# Patient Record
Sex: Female | Born: 1935
Health system: Southern US, Community
[De-identification: ages and names within clinical notes are randomized; demographics above are authoritative.]

## PROBLEM LIST (undated history)

## (undated) ENCOUNTER — Emergency Department (HOSPITAL_COMMUNITY): Admission: EM | Payer: Self-pay | Source: Home / Self Care

## (undated) DIAGNOSIS — H9190 Unspecified hearing loss, unspecified ear: Secondary | ICD-10-CM

## (undated) DIAGNOSIS — R918 Other nonspecific abnormal finding of lung field: Secondary | ICD-10-CM

## (undated) DIAGNOSIS — C801 Malignant (primary) neoplasm, unspecified: Secondary | ICD-10-CM

## (undated) DIAGNOSIS — I2510 Atherosclerotic heart disease of native coronary artery without angina pectoris: Secondary | ICD-10-CM

## (undated) DIAGNOSIS — I252 Old myocardial infarction: Secondary | ICD-10-CM

## (undated) DIAGNOSIS — I440 Atrioventricular block, first degree: Secondary | ICD-10-CM

## (undated) DIAGNOSIS — N823 Fistula of vagina to large intestine: Principal | ICD-10-CM

## (undated) DIAGNOSIS — R0600 Dyspnea, unspecified: Secondary | ICD-10-CM

## (undated) DIAGNOSIS — E785 Hyperlipidemia, unspecified: Secondary | ICD-10-CM

## (undated) DIAGNOSIS — IMO0001 Reserved for inherently not codable concepts without codable children: Secondary | ICD-10-CM

## (undated) DIAGNOSIS — Z955 Presence of coronary angioplasty implant and graft: Secondary | ICD-10-CM

## (undated) DIAGNOSIS — I480 Paroxysmal atrial fibrillation: Secondary | ICD-10-CM

## (undated) DIAGNOSIS — M1990 Unspecified osteoarthritis, unspecified site: Secondary | ICD-10-CM

## (undated) DIAGNOSIS — T4145XA Adverse effect of unspecified anesthetic, initial encounter: Secondary | ICD-10-CM

## (undated) DIAGNOSIS — T8859XA Other complications of anesthesia, initial encounter: Secondary | ICD-10-CM

## (undated) DIAGNOSIS — I10 Essential (primary) hypertension: Secondary | ICD-10-CM

## (undated) DIAGNOSIS — Z9889 Other specified postprocedural states: Secondary | ICD-10-CM

## (undated) DIAGNOSIS — H4450 Unspecified degenerated conditions of globe: Secondary | ICD-10-CM

## (undated) DIAGNOSIS — M8580 Other specified disorders of bone density and structure, unspecified site: Secondary | ICD-10-CM

## (undated) DIAGNOSIS — R112 Nausea with vomiting, unspecified: Secondary | ICD-10-CM

## (undated) DIAGNOSIS — Z933 Colostomy status: Secondary | ICD-10-CM

## (undated) DIAGNOSIS — R002 Palpitations: Secondary | ICD-10-CM

## (undated) HISTORY — DX: Unspecified osteoarthritis, unspecified site: M19.90

## (undated) HISTORY — PX: OTHER SURGICAL HISTORY: SHX169

## (undated) HISTORY — PX: CORONARY ANGIOPLASTY WITH STENT PLACEMENT: SHX49

## (undated) HISTORY — PX: TONSILLECTOMY: SUR1361

## (undated) HISTORY — DX: Essential (primary) hypertension: I10

## (undated) HISTORY — DX: Atherosclerotic heart disease of native coronary artery without angina pectoris: I25.10

## (undated) HISTORY — PX: CATARACT EXTRACTION W/ INTRAOCULAR LENS  IMPLANT, BILATERAL: SHX1307

## (undated) HISTORY — DX: Hyperlipidemia, unspecified: E78.5

## (undated) HISTORY — DX: Malignant (primary) neoplasm, unspecified: C80.1

## (undated) HISTORY — DX: Fistula of vagina to large intestine: N82.3

---
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
## 1968-10-14 HISTORY — PX: STAPEDECTOMY: SHX2435

## 1990-02-13 HISTORY — PX: TOTAL HIP ARTHROPLASTY: SHX124

## 1994-02-13 HISTORY — PX: KNEE SURGERY: SHX244

## 1998-07-15 ENCOUNTER — Inpatient Hospital Stay (HOSPITAL_COMMUNITY): Admission: RE | Admit: 1998-07-15 | Discharge: 1998-07-18 | Payer: Self-pay | Admitting: Orthopaedic Surgery

## 1998-07-15 ENCOUNTER — Encounter: Payer: Self-pay | Admitting: Orthopaedic Surgery

## 1998-10-15 ENCOUNTER — Encounter: Admission: RE | Admit: 1998-10-15 | Discharge: 1999-01-13 | Payer: Self-pay

## 1999-05-04 ENCOUNTER — Encounter: Payer: Self-pay | Admitting: Family Medicine

## 1999-05-04 ENCOUNTER — Encounter: Admission: RE | Admit: 1999-05-04 | Discharge: 1999-05-04 | Payer: Self-pay | Admitting: Family Medicine

## 2000-02-15 ENCOUNTER — Ambulatory Visit (HOSPITAL_COMMUNITY): Admission: RE | Admit: 2000-02-15 | Discharge: 2000-02-15 | Payer: Self-pay | Admitting: Gastroenterology

## 2000-05-14 ENCOUNTER — Encounter: Admission: RE | Admit: 2000-05-14 | Discharge: 2000-05-14 | Payer: Self-pay

## 2001-06-05 ENCOUNTER — Encounter: Admission: RE | Admit: 2001-06-05 | Discharge: 2001-06-05 | Payer: Self-pay | Admitting: Family Medicine

## 2001-06-05 ENCOUNTER — Encounter: Payer: Self-pay | Admitting: Family Medicine

## 2002-06-25 ENCOUNTER — Encounter: Payer: Self-pay | Admitting: Family Medicine

## 2002-06-25 ENCOUNTER — Encounter: Admission: RE | Admit: 2002-06-25 | Discharge: 2002-06-25 | Payer: Self-pay | Admitting: Family Medicine

## 2003-07-01 ENCOUNTER — Encounter: Admission: RE | Admit: 2003-07-01 | Discharge: 2003-07-01 | Payer: Self-pay | Admitting: Family Medicine

## 2004-02-10 ENCOUNTER — Ambulatory Visit (HOSPITAL_COMMUNITY): Admission: RE | Admit: 2004-02-10 | Discharge: 2004-02-10 | Payer: Self-pay | Admitting: Family Medicine

## 2004-02-26 ENCOUNTER — Ambulatory Visit: Admission: RE | Admit: 2004-02-26 | Discharge: 2004-02-26 | Payer: Self-pay | Admitting: Orthopedic Surgery

## 2004-03-01 ENCOUNTER — Ambulatory Visit (HOSPITAL_COMMUNITY): Admission: RE | Admit: 2004-03-01 | Discharge: 2004-03-01 | Payer: Self-pay | Admitting: Family Medicine

## 2004-03-09 ENCOUNTER — Ambulatory Visit (HOSPITAL_COMMUNITY): Admission: RE | Admit: 2004-03-09 | Discharge: 2004-03-09 | Payer: Self-pay

## 2004-07-06 ENCOUNTER — Encounter: Admission: RE | Admit: 2004-07-06 | Discharge: 2004-07-06 | Payer: Self-pay | Admitting: Family Medicine

## 2005-07-12 ENCOUNTER — Encounter: Admission: RE | Admit: 2005-07-12 | Discharge: 2005-07-12 | Payer: Self-pay | Admitting: Family Medicine

## 2006-07-17 ENCOUNTER — Encounter: Admission: RE | Admit: 2006-07-17 | Discharge: 2006-07-17 | Payer: Self-pay | Admitting: Family Medicine

## 2007-08-06 ENCOUNTER — Encounter: Admission: RE | Admit: 2007-08-06 | Discharge: 2007-08-06 | Payer: Self-pay | Admitting: Family Medicine

## 2008-08-06 ENCOUNTER — Encounter: Admission: RE | Admit: 2008-08-06 | Discharge: 2008-08-06 | Payer: Self-pay | Admitting: Family Medicine

## 2009-08-15 ENCOUNTER — Inpatient Hospital Stay (HOSPITAL_COMMUNITY): Admission: EM | Admit: 2009-08-15 | Discharge: 2009-08-17 | Payer: Self-pay | Admitting: Emergency Medicine

## 2009-10-25 ENCOUNTER — Encounter: Admission: RE | Admit: 2009-10-25 | Discharge: 2009-10-25 | Payer: Self-pay | Admitting: *Deleted

## 2009-11-10 ENCOUNTER — Ambulatory Visit (HOSPITAL_BASED_OUTPATIENT_CLINIC_OR_DEPARTMENT_OTHER): Admission: RE | Admit: 2009-11-10 | Discharge: 2009-11-10 | Payer: Self-pay | Admitting: Orthopedic Surgery

## 2009-11-10 HISTORY — PX: OTHER SURGICAL HISTORY: SHX169

## 2009-12-23 ENCOUNTER — Ambulatory Visit (HOSPITAL_COMMUNITY): Admission: RE | Admit: 2009-12-23 | Discharge: 2009-12-24 | Payer: Self-pay | Admitting: Otolaryngology

## 2009-12-23 ENCOUNTER — Encounter (INDEPENDENT_AMBULATORY_CARE_PROVIDER_SITE_OTHER): Payer: Self-pay | Admitting: Otolaryngology

## 2009-12-23 HISTORY — PX: TYMPANOPLASTY: SHX33

## 2010-03-05 ENCOUNTER — Encounter: Payer: Self-pay | Admitting: Family Medicine

## 2010-03-06 ENCOUNTER — Encounter: Payer: Self-pay | Admitting: Family Medicine

## 2010-04-04 NOTE — Op Note (Signed)
NAMEELASHA, TESS               ACCOUNT NO.:  [id number]  MEDICAL RECORD NO.:  [id number]          PATIENT TYPE:  AMB  LOCATION:  DSC                          FACILITY:  [facility]  PHYSICIAN:  Cindee Salt, M.D.       DATE OF BIRTH:  July 29, 1916  DATE OF PROCEDURE:  11/10/2009 DATE OF DISCHARGE:                              OPERATIVE REPORT   PREOPERATIVE DIAGNOSIS:  Malunion left distal radius, carpal tunnel syndrome left hand.  POSTOPERATIVE DIAGNOSIS:  Malunion left distal radius, carpal tunnel syndrome left hand.  OPERATION:  Osteotomy left distal radius with release of left carpal tunnel.  SURGEON:  Cindee Salt, MD  ASSISTANT:  Artist Pais. Mina Marble, MD  ANESTHESIA:  Axillary general.  ANESTHESIOLOGIST:  Germaine Pomfret, MD  HISTORY:  The patient is a 77 year old female who suffered a fall, fracturing left distal radius.  She has had this heal in a malunited position.  She also has developed carpal tunnel syndrome with numbnessand tingling of all of her fingers.  EMG nerve conductions are positive. She has elected to undergo osteotomy with decompression of the median nerve.  Pre, peri, postoperative course have been discussed along with risks and complications.  She is aware that there is no guarantee with the surgery, possibility of infection, recurrence of injury to arteries, nerves, tendons, incomplete relief of symptoms, dystrophy.  In the preoperative area, the patient is seen, the extremity marked by both the patient and surgeon, antibiotic given.  PROCEDURE:  The patient was brought to the operating room where a general anesthetic was carried out without difficulty after an axillary block was carried out.  She was prepped using DuraPrep, supine position, left arm free.  A 3-minute dry time was allowed, time-out taken, confirming the patient and procedure.  Following an adequate anesthesia, the limb was exsanguinated with an Esmarch bandage, tourniquet  placed high on the arm was inflated to 250 mmHg.  An incision was made, extended for a volar distal plate, carried down through subcutaneous tissue.  Bleeders were electrocauterized with bipolar.  Dissection carried through the flexor carpi radialis tendon sheath down to the pronator quadratus.  This was incised.  The periosteum elevated.  The brachioradialis was released.  The fracture line was identified with image intensification.  An osteotomy was then performed with an oscillating saw.  The dorsal scar was then released.  This allowed the distal fragment to be manipulated volarly.  A narrow standard DVR plate to the left wrist was then applied.  The distal screws were placed. These were all locking screws measuring between 20 and 24 mm.  These firmly fixed the fracture fragment distally.  The plate was then approximated to the radial shaft.  This was pinned in position to confirm position.  The distal radius was adequately reduced with restoration of reasonable volar tilt, length, and inclination.  This produced a very significant dorsal gap.  The plate was then affixed with 13, 12, and 10 mm screws.  A Trinity bone graft was then defrosted, cleared.  This was then packed into the osteotomy site dorsally.  X-rays confirmed adequate positioning of the plate,  screws, and bone graft. The wound was irrigated removing any chips from the soft tissue as much as possible without disturbing the dorsal graft.  The pronator quadratus was repaired as much as possible with interrupted 4-0 Vicryl sutures, the subcutaneous tissue with interrupted 4-0 Vicryl, and the skin with interrupted 4-0 Vicryl Rapide.  Separate incision was then made for carpal tunnel release, carried down through subcutaneous tissue. Bleeders again electrocauterized with bipolar.  The palmar fascia split. Superficial palmar arch identified.  The flexor retinaculum was then incised over its entire course.  Retractors were  placed proximally and a release performed of the volar fascia.  The nerve was explored.  I area of compression was apparent.  No further lesions were identified.  The wound was irrigated and closed with interrupted 5-0 Vicryl Rapide sutures.  A sterile compressive and dorsal palmar splint was applied. On deflation of the tourniquet, all fingers immediately pinked.  She was taken to the recovery room for observation in satisfactory condition. She will be discharged home being admitted for overnight stay on Percocet.          ______________________________ Cindee Salt, M.D.     GK/MEDQ  D:  11/10/2009  T:  11/11/2009  Job:  [id number]  cc:   Estill Bamberg, MD  Electronically Signed by Cindee Salt M.D. on 04/04/2010 12:10:08 PM

## 2010-04-26 LAB — URINALYSIS, ROUTINE W REFLEX MICROSCOPIC
Bilirubin Urine: NEGATIVE
Glucose, UA: NEGATIVE mg/dL
Hgb urine dipstick: NEGATIVE
Ketones, ur: NEGATIVE mg/dL
Nitrite: NEGATIVE
Protein, ur: NEGATIVE mg/dL
Specific Gravity, Urine: 1.018 (ref 1.005–1.030)
Urobilinogen, UA: 0.2 mg/dL (ref 0.0–1.0)
pH: 6 (ref 5.0–8.0)

## 2010-04-26 LAB — COMPREHENSIVE METABOLIC PANEL WITH GFR
ALT: 21 U/L (ref 0–35)
AST: 24 U/L (ref 0–37)
CO2: 32 meq/L (ref 19–32)
Calcium: 9.9 mg/dL (ref 8.4–10.5)
Chloride: 102 meq/L (ref 96–112)
GFR calc Af Amer: >60 mL/min (ref >60)
GFR calc non Af Amer: 58 mL/min — ABNORMAL LOW (ref >60)
Sodium: 138 meq/L (ref 135–145)
Total Bilirubin: 0.6 mg/dL (ref 0.3–1.2)

## 2010-04-26 LAB — SURGICAL PCR SCREEN
MRSA, PCR: NEGATIVE
Staphylococcus aureus: NEGATIVE

## 2010-04-26 LAB — CBC
HCT: 37.7 % (ref 36.0–46.0)
Hemoglobin: 12.5 g/dL (ref 12.0–15.0)
MCH: 31.3 pg (ref 26.0–34.0)
MCHC: 33.2 g/dL (ref 30.0–36.0)
MCV: 94.3 fL (ref 78.0–100.0)
Platelets: 210 10*3/uL (ref 150–400)
RBC: 4 MIL/uL (ref 3.87–5.11)
RDW: 12.3 % (ref 11.5–15.5)
WBC: 7.3 10*3/uL (ref 4.0–10.5)

## 2010-04-26 LAB — COMPREHENSIVE METABOLIC PANEL
Albumin: 3.7 g/dL (ref 3.5–5.2)
Alkaline Phosphatase: 67 U/L (ref 39–117)
BUN: 13 mg/dL (ref 6–23)
Creatinine, Ser: 0.91 mg/dL (ref 0.4–1.2)
Glucose, Bld: 81 mg/dL (ref 70–99)
Potassium: 4.8 mEq/L (ref 3.5–5.1)
Total Protein: 6.9 g/dL (ref 6.0–8.3)

## 2010-04-28 LAB — POCT I-STAT, CHEM 8
Calcium, Ion: 0.95 mmol/L — ABNORMAL LOW (ref 1.12–1.32)
Chloride: 109 mEq/L (ref 96–112)
HCT: 40 % (ref 36.0–46.0)
Sodium: 137 mEq/L (ref 135–145)
TCO2: 22 mmol/L (ref 0–100)

## 2010-05-01 LAB — URINALYSIS, ROUTINE W REFLEX MICROSCOPIC
Bilirubin Urine: NEGATIVE
Hgb urine dipstick: NEGATIVE
Protein, ur: NEGATIVE mg/dL
Urobilinogen, UA: 0.2 mg/dL (ref 0.0–1.0)

## 2010-05-01 LAB — DIFFERENTIAL
Basophils Absolute: 0 10*3/uL (ref 0.0–0.1)
Lymphocytes Relative: 8 % — ABNORMAL LOW (ref 12–46)
Neutro Abs: 6.8 10*3/uL (ref 1.7–7.7)

## 2010-05-01 LAB — COMPREHENSIVE METABOLIC PANEL
ALT: 25 U/L (ref 0–35)
AST: 38 U/L — ABNORMAL HIGH (ref 0–37)
Albumin: 3.3 g/dL — ABNORMAL LOW (ref 3.5–5.2)
Alkaline Phosphatase: 48 U/L (ref 39–117)
Alkaline Phosphatase: 55 U/L (ref 39–117)
BUN: 13 mg/dL (ref 6–23)
CO2: 24 mEq/L (ref 19–32)
Chloride: 107 mEq/L (ref 96–112)
GFR calc non Af Amer: >60 mL/min (ref >60)
Glucose, Bld: 88 mg/dL (ref 70–99)
Potassium: 3.8 mEq/L (ref 3.5–5.1)
Potassium: 4.1 mEq/L (ref 3.5–5.1)
Sodium: 136 mEq/L (ref 135–145)
Total Bilirubin: 0.8 mg/dL (ref 0.3–1.2)
Total Protein: 5.5 g/dL — ABNORMAL LOW (ref 6.0–8.3)
Total Protein: 6.1 g/dL (ref 6.0–8.3)

## 2010-05-01 LAB — APTT: aPTT: 27 seconds (ref 24–37)

## 2010-05-01 LAB — URINE CULTURE

## 2010-05-01 LAB — TROPONIN I
Troponin I: 0.02 ng/mL (ref 0.00–0.06)
Troponin I: 0.02 ng/mL (ref 0.00–0.06)
Troponin I: 0.04 ng/mL (ref 0.00–0.06)

## 2010-05-01 LAB — CBC
HCT: 30.5 % — ABNORMAL LOW (ref 36.0–46.0)
MCH: 31.8 pg (ref 26.0–34.0)
MCHC: 34.3 g/dL (ref 30.0–36.0)
Platelets: 194 10*3/uL (ref 150–400)
Platelets: 28 10*3/uL — CL (ref 150–400)
RBC: 3.12 MIL/uL — ABNORMAL LOW (ref 3.87–5.11)
RDW: 12.1 % (ref 11.5–15.5)
RDW: 12.4 % (ref 11.5–15.5)
RDW: 12.4 % (ref 11.5–15.5)
WBC: 2.8 10*3/uL — ABNORMAL LOW (ref 4.0–10.5)
WBC: 7.5 10*3/uL (ref 4.0–10.5)

## 2010-05-01 LAB — CK TOTAL AND CKMB (NOT AT ARMC)
CK, MB: 3.4 ng/mL (ref 0.3–4.0)
Relative Index: 1.6 (ref 0.0–2.5)
Total CK: 212 U/L — ABNORMAL HIGH (ref 7–177)
Total CK: 225 U/L — ABNORMAL HIGH (ref 7–177)
Total CK: 237 U/L — ABNORMAL HIGH (ref 7–177)

## 2010-05-01 LAB — PROTIME-INR
INR: 0.99 (ref 0.00–1.49)
Prothrombin Time: 13 seconds (ref 11.6–15.2)

## 2010-05-01 LAB — POCT CARDIAC MARKERS
CKMB, poc: 1.8 ng/mL (ref 1.0–8.0)
Troponin i, poc: <0.05 ng/mL (ref 0.00–0.09)

## 2010-07-01 NOTE — Procedures (Signed)
[facility]  Patient:    [name], [name]                      MRN: [id number] Proc. Date: 02/15/00 Adm. Date:  [id number] Attending:  Louie Bun CC:         Hadassah Pais. Jeannetta Nap, M.D.   Procedure Report  PROCEDURES:  Colonoscopy.  INDICATIONS FOR PROCEDURE:  Hemoccult positive stools in a patient with family history of colon cancer in a first degree relative whose last colonoscopy was approximately four years ago.  DESCRIPTION OF PROCEDURE:  The patient was placed in the left lateral decubitus position and placed on the pulse monitor with continuous low-flow oxygen delivered by nasal cannula.  She was sedated with 50 mg of IV Demerol and 6 mg of IV Versed.  The Olympus video colonoscope was inserted into the rectum and advanced to the cecum, confirmed by transillumination McBurneys point and visualization of the ileocecal valve and appendicial orifice.  The prep was good.  The cecum, ascending, transverse, and proximal descending colon appeared normal with no masses, polyps, diverticula, or other mucosal abnormalities.  Within the distal descending and sigmoid colon there was seen several scattered diverticula, but no other abnormalities.  The rectum appeared normal and retroflexed view of the anus showed no obvious prominence of internal hemorrhoids.  The colonoscope was then withdrawn and the patient returned to the recovery room in stable condition.  She tolerated the procedure well and there were no immediate complications.  IMPRESSION:  Diverticula, otherwise normal colonoscopy.  PLAN:  At age 77 we will probably discontinue surveillance colonoscopy, but will consider in five years if reasonable. DD:  02/15/00 TD:  02/15/00 Job: [id number]

## 2010-08-14 DIAGNOSIS — C801 Malignant (primary) neoplasm, unspecified: Secondary | ICD-10-CM

## 2010-08-14 DIAGNOSIS — Z955 Presence of coronary angioplasty implant and graft: Secondary | ICD-10-CM

## 2010-08-14 HISTORY — DX: Malignant (primary) neoplasm, unspecified: C80.1

## 2010-08-14 HISTORY — DX: Presence of coronary angioplasty implant and graft: Z95.5

## 2010-08-18 ENCOUNTER — Other Ambulatory Visit: Payer: Self-pay | Admitting: Gastroenterology

## 2010-08-26 ENCOUNTER — Ambulatory Visit (HOSPITAL_COMMUNITY)
Admission: EM | Admit: 2010-08-26 | Discharge: 2010-08-26 | Disposition: A | Discharge status: Home / Self Care | Payer: Medicare Other | Source: Ambulatory Visit | Attending: Cardiology | Admitting: Cardiology

## 2010-08-26 ENCOUNTER — Emergency Department (HOSPITAL_COMMUNITY): Admission: EM | Admit: 2010-08-26 | Payer: Self-pay | Source: Home / Self Care

## 2010-08-26 ENCOUNTER — Inpatient Hospital Stay (HOSPITAL_COMMUNITY)
Admission: AD | Admit: 2010-08-26 | Discharge: 2010-08-30 | DRG: 249 | Disposition: A | Discharge status: Home / Self Care | Payer: Medicare Other | Source: Ambulatory Visit | Attending: Cardiology | Admitting: Cardiology

## 2010-08-26 DIAGNOSIS — Z882 Allergy status to sulfonamides status: Secondary | ICD-10-CM

## 2010-08-26 DIAGNOSIS — I4891 Unspecified atrial fibrillation: Secondary | ICD-10-CM | POA: Diagnosis present

## 2010-08-26 DIAGNOSIS — I2119 ST elevation (STEMI) myocardial infarction involving other coronary artery of inferior wall: Secondary | ICD-10-CM

## 2010-08-26 DIAGNOSIS — Z96649 Presence of unspecified artificial hip joint: Secondary | ICD-10-CM

## 2010-08-26 DIAGNOSIS — C20 Malignant neoplasm of rectum: Secondary | ICD-10-CM | POA: Diagnosis present

## 2010-08-26 DIAGNOSIS — Z7982 Long term (current) use of aspirin: Secondary | ICD-10-CM

## 2010-08-26 DIAGNOSIS — Z7902 Long term (current) use of antithrombotics/antiplatelets: Secondary | ICD-10-CM

## 2010-08-26 DIAGNOSIS — I2510 Atherosclerotic heart disease of native coronary artery without angina pectoris: Secondary | ICD-10-CM

## 2010-08-26 DIAGNOSIS — Z886 Allergy status to analgesic agent status: Secondary | ICD-10-CM

## 2010-08-26 LAB — CBC
HCT: 32.8 % — ABNORMAL LOW (ref 36.0–46.0)
Hemoglobin: 11.3 g/dL — ABNORMAL LOW (ref 12.0–15.0)
Hemoglobin: 11.7 g/dL — ABNORMAL LOW (ref 12.0–15.0)
MCH: 31.3 pg (ref 26.0–34.0)
MCH: 32.1 pg (ref 26.0–34.0)
MCHC: 34.5 g/dL (ref 30.0–36.0)
MCHC: 34.9 g/dL (ref 30.0–36.0)
MCV: 90.9 fL (ref 78.0–100.0)
Platelets: 204 10*3/uL (ref 150–400)
RBC: 3.65 MIL/uL — ABNORMAL LOW (ref 3.87–5.11)
RDW: 12.3 % (ref 11.5–15.5)

## 2010-08-26 LAB — DIFFERENTIAL
Basophils Absolute: 0 10*3/uL (ref 0.0–0.1)
Basophils Relative: 0 % (ref 0–1)
Eosinophils Absolute: 0.1 10*3/uL (ref 0.0–0.7)
Monocytes Absolute: 0.3 10*3/uL (ref 0.1–1.0)
Monocytes Relative: 6 % (ref 3–12)
Neutro Abs: 3.7 10*3/uL (ref 1.7–7.7)
Neutrophils Relative %: 68 % (ref 43–77)

## 2010-08-26 LAB — CARDIAC PANEL(CRET KIN+CKTOT+MB+TROPI)
CK, MB: 10.4 ng/mL (ref 0.3–4.0)
Relative Index: 7.2 — ABNORMAL HIGH (ref 0.0–2.5)
Troponin I: 0.68 ng/mL (ref <0.30)

## 2010-08-26 LAB — COMPREHENSIVE METABOLIC PANEL
AST: 46 U/L — ABNORMAL HIGH (ref 0–37)
Albumin: 3.1 g/dL — ABNORMAL LOW (ref 3.5–5.2)
Alkaline Phosphatase: 59 U/L (ref 39–117)
Chloride: 101 mEq/L (ref 96–112)
Potassium: 3.9 mEq/L (ref 3.5–5.1)
Sodium: 133 mEq/L — ABNORMAL LOW (ref 135–145)
Total Bilirubin: 0.3 mg/dL (ref 0.3–1.2)
Total Protein: 6.2 g/dL (ref 6.0–8.3)

## 2010-08-26 LAB — MAGNESIUM: Magnesium: 1.8 mg/dL (ref 1.5–2.5)

## 2010-08-26 LAB — LIPID PANEL
LDL Cholesterol: 90 mg/dL (ref 0–99)
Total CHOL/HDL Ratio: 2.9 RATIO
VLDL: 28 mg/dL (ref 0–40)

## 2010-08-26 LAB — MRSA PCR SCREENING: MRSA by PCR: NEGATIVE

## 2010-08-27 DIAGNOSIS — R072 Precordial pain: Secondary | ICD-10-CM

## 2010-08-27 LAB — CARDIAC PANEL(CRET KIN+CKTOT+MB+TROPI)
CK, MB: 41.8 ng/mL (ref 0.3–4.0)
CK, MB: 40.4 ng/mL (ref 0.3–4.0)
Total CK: 369 U/L — ABNORMAL HIGH (ref 7–177)
Troponin I: 19.41 ng/mL (ref <0.30)

## 2010-08-27 LAB — CBC
Hemoglobin: 10.6 g/dL — ABNORMAL LOW (ref 12.0–15.0)
MCH: 31.2 pg (ref 26.0–34.0)
MCHC: 33.8 g/dL (ref 30.0–36.0)
MCV: 92.4 fL (ref 78.0–100.0)

## 2010-08-27 LAB — BASIC METABOLIC PANEL
CO2: 27 mEq/L (ref 19–32)
Calcium: 8.9 mg/dL (ref 8.4–10.5)
Creatinine, Ser: 0.72 mg/dL (ref 0.50–1.10)
GFR calc non Af Amer: >60 mL/min (ref >60)
Glucose, Bld: 92 mg/dL (ref 70–99)
Sodium: 134 mEq/L — ABNORMAL LOW (ref 135–145)

## 2010-08-27 LAB — HEMOGLOBIN A1C: Hgb A1c MFr Bld: 5.9 % — ABNORMAL HIGH (ref <5.7)

## 2010-08-27 LAB — TSH: TSH: 1.795 u[IU]/mL (ref 0.350–4.500)

## 2010-08-29 LAB — CARDIAC PANEL(CRET KIN+CKTOT+MB+TROPI)
CK, MB: 5.1 ng/mL — ABNORMAL HIGH (ref 0.3–4.0)
Relative Index: INVALID (ref 0.0–2.5)
Total CK: 92 U/L (ref 7–177)
Troponin I: 3 ng/mL (ref <0.30)

## 2010-08-29 LAB — BASIC METABOLIC PANEL
BUN: 13 mg/dL (ref 6–23)
Creatinine, Ser: 0.71 mg/dL (ref 0.50–1.10)
GFR calc Af Amer: >60 mL/min (ref >60)
GFR calc non Af Amer: >60 mL/min (ref >60)
Glucose, Bld: 87 mg/dL (ref 70–99)
Potassium: 3.9 mEq/L (ref 3.5–5.1)

## 2010-08-29 LAB — CBC
HCT: 37.1 % (ref 36.0–46.0)
Hemoglobin: 12.7 g/dL (ref 12.0–15.0)
MCH: 31.5 pg (ref 26.0–34.0)
MCV: 92.1 fL (ref 78.0–100.0)
Platelets: 201 10*3/uL (ref 150–400)
RBC: 4.03 MIL/uL (ref 3.87–5.11)
WBC: 7.3 10*3/uL (ref 4.0–10.5)

## 2010-08-29 LAB — PLATELET INHIBITION P2Y12
Platelet Function  P2Y12: 270 [PRU] (ref 194–418)
Platelet Function Baseline: 317 [PRU] (ref 194–418)

## 2010-08-29 NOTE — Cardiovascular Report (Signed)
[name], [name] NO.:  [id number]  MEDICAL RECORD NO.:  [id number]  LOCATION:                                 FACILITY:  PHYSICIAN:  Verne Carrow, MDDATE OF BIRTH:  06-Sep-1916  DATE OF PROCEDURE:  08/26/2010 DATE OF DISCHARGE:                           CARDIAC CATHETERIZATION   PRIMARY CARE PHYSICIAN:  Windle Guard, MD  PROCEDURES PERFORMED: 1. Left heart catheterization. 2. Selective coronary angiography. 3. PTCA with placement of a bare metal stent in the mid right coronary     artery.  OPERATOR:  Verne Carrow, MD  INDICATION:  This is a 77 year old Caucasian female who presents with chest pain and has EKG changes consistent with an inferior ST-elevation myocardial infarction.  DETAILS OF PROCEDURE:  The patient was brought emergently to the cardiac catheterization laboratory.  Emergency consent was obtained.  The right groin was prepped and draped in sterile fashion.  Lidocaine 1% was used for local anesthesia.  A 6-French sheath was inserted into the right femoral artery without difficulty.  A JL-4 diagnostic catheter was used to perform angiography of the left coronary system.  We then perform selective angiography of the right coronary artery with a 6-French JR-4 guiding catheter.  The patient was found to have a totally occluded mid right coronary artery.  She was given a bolus of Angiomax and a drip was started.  We then crossed the total occlusion with a cougar intracoronary wire.  A 2.5 x 12-mm balloon was used to reestablish flow. There was excellent flow into the distal vessel following 2 balloon inflations.  A 2.75 x 15-mm Vision bare metal stent was then carefully deployed in the mid vessel.  A 3.0 x 12-mm noncompliant balloon was carefully positioned inside the stent and was inflated.  There was an excellent angiographic result.  The stenosis was taken from 100% to 0%. The patient tolerated the procedure well.  She  did have an episode of bradycardia following reperfusion and was given atropine intravenously. The patient was loaded with 600 mg of Plavix.  She was taken to the CCU on an Angiomax drip at a reduced rate.  HEMODYNAMIC FINDINGS:  Central aortic pressure 138/60.  Left ventricular pressure 138/7/11.  ANGIOGRAPHIC FINDINGS: 1. The left main coronary artery had no obstructive disease. 2. The left anterior descending was a large vessel that coursed to the     apex and had 30% proximal stenosis and long tubular 50% mid     stenosis.  First diagonal was a moderate-sized vessel with 40%     ostial stenosis. 3. The circumflex artery had a proximal 95% stenosis.  There was a     large obtuse marginal branch that was free of any disease. 4. The right coronary artery was a large dominant vessel with 100% mid     occlusion. 5. No left ventricular angiogram was performed.  The total balloon time was 11 minutes.  IMPRESSION: 1. Acute inferior ST-elevation myocardial infarction. 2. Percutaneous transluminal coronary angioplasty with placement of a bare metal stent in the right coronary artery. 3. Residual stenosis in the proximal circumflex artery.  RECOMMENDATIONS:  The patient will be continued on aspirin, Plavix, a  beta-blocker, and a statin.  She will be watched closely in the CCU over the next 48 hours.  We will plan on performing percutaneous intervention of the circumflex artery on Monday, August 29, 2010.     Verne Carrow, MD     CM/MEDQ  D:  08/26/2010  T:  08/27/2010  Job:  [id number]  cc:   Windle Guard, M.D.  Electronically Signed by Verne Carrow MD on 08/29/2010 12:58:58 PM

## 2010-08-29 NOTE — H&P (Signed)
  NAMELORRAINA, SPRING NO.:  [id number]  MEDICAL RECORD NO.:  [id number]  LOCATION:  2905                         FACILITY:  [facility]  PHYSICIAN:  Verne Carrow, MDDATE OF BIRTH:  June 07, 1916  DATE OF ADMISSION:  08/26/2010 DATE OF DISCHARGE:                             HISTORY & PHYSICAL   REASON FOR ADMISSION:  ST elevation myocardial infarction.  HISTORY OF PRESENT ILLNESS:  Ms. Sager is a pleasant 77 year old Caucasian female with a history of atrial fibrillation and recently diagnosed rectal cancer who began to have chest discomfort at 2:30 this afternoon.  She was sent to see her primary care physician who got an EKG and then called [facility].  Her EKG was consistent with an inferior ST elevation myocardial infarction.  A code STEMI was activated.  The patient was brought straight to the cath lab.  She was having ongoing chest pain upon arrival in the cath lab.  I performed a quick assessment.  I discovered that she was a very functional 77 year old.  She agreed to proceed with catheterization.  PAST MEDICAL HISTORY: 1. Atrial fibrillation. 2. Recently diagnosed rectal cancer.  PAST SURGICAL HISTORY: 1. Hysterectomy. 2. Appendectomy. 3. Hip replacement. 4. Right knee surgery. 5. Tonsillectomy. 6. Bilateral cataract surgery. 7. Carpal tunnel release.  ALLERGIES:  SULFA, EFFEXOR, HYDROCODONE and VICODIN.  HOME MEDICATIONS:  Atenolol and aspirin, doses unknown.  SOCIAL HISTORY:  The patient denies use of alcohol, drugs or tobacco.  FAMILY HISTORY:  No coronary artery disease.  REVIEW OF SYSTEMS:  As stated in history of present illness is otherwise negative.  PHYSICAL EXAMINATION:  VITAL SIGNS:  Blood pressure 130/60, pulse 65 and regular, respirations 12 and unlabored, temperature afebrile. GENERAL:  She is a pleasant elderly Caucasian female in no acute distress. NECK:  No JVD.  No carotid bruits. SKIN:  Warm and  dry. PSYCHIATRIC:  Mood and affect are appropriate. MUSCULOSKELETAL:  Moves all extremities equally. NEUROLOGICAL:  Nonfocal. LUNGS:  Clear to auscultation bilaterally. CARDIOVASCULAR:  Regular rate and rhythm with mild systolic murmur. ABDOMEN:  Soft.  Bowel sounds are present. EXTREMITIES:  No evidence of edema.  DIAGNOSTIC STUDIES:  Twelve-lead EKG has ST-segment elevation consistent with an inferior myocardial infarction.  No other laboratory values are available at this time.  ASSESSMENT/PLAN:  This is a 77 year old functional Caucasian female who presents with complaints of chest pain is found to have EKG changes consistent with an acute inferior ST elevation myocardial infarction. Plans will be for an emergent left heart catheterization.     Verne Carrow, MD     CM/MEDQ  D:  08/26/2010  T:  08/27/2010  Job:  [id number]  Electronically Signed by Verne Carrow MD on 08/29/2010 12:58:56 PM

## 2010-08-30 ENCOUNTER — Ambulatory Visit (INDEPENDENT_AMBULATORY_CARE_PROVIDER_SITE_OTHER): Payer: Self-pay | Admitting: Surgery

## 2010-08-30 LAB — CBC
Hemoglobin: 12 g/dL (ref 12.0–15.0)
MCH: 31 pg (ref 26.0–34.0)
MCHC: 33.6 g/dL (ref 30.0–36.0)
RDW: 12.5 % (ref 11.5–15.5)

## 2010-08-30 LAB — BASIC METABOLIC PANEL
BUN: 12 mg/dL (ref 6–23)
Calcium: 8.8 mg/dL (ref 8.4–10.5)
GFR calc Af Amer: >60 mL/min (ref >60)
GFR calc non Af Amer: >60 mL/min (ref >60)
Glucose, Bld: 89 mg/dL (ref 70–99)
Potassium: 4.3 mEq/L (ref 3.5–5.1)
Sodium: 138 mEq/L (ref 135–145)

## 2010-08-30 LAB — POCT ACTIVATED CLOTTING TIME: Activated Clotting Time: 336 seconds

## 2010-08-30 NOTE — Discharge Summary (Addendum)
NAMEYUMA, [name] NO.:  [id number]  MEDICAL RECORD NO.:  [id number]  LOCATION:  6529                         FACILITY:  [facility]  PHYSICIAN:  Verne Carrow, MDDATE OF BIRTH:  16-Aug-1916  DATE OF ADMISSION:  08/26/2010 DATE OF DISCHARGE:  08/30/2010                              DISCHARGE SUMMARY   DISCHARGE DIAGNOSES: 1. Newly diagnosed coronary artery disease presenting initially as an     ST-elevation myocardial infarction.     a.     Emergent catheterization on August 26, 2010, for ST-segment      elevation myocardial infarction with percutaneous transluminal      coronary angioplasty and placement of bare-metal stent to the mid      right coronary artery.     b.     Staged percutaneous coronary intervention on August 29, 2010,      with bare-metal stent placement to the mid circumflex artery. 2. Paroxysmal atrial fibrillation, not currently felt to be a     Coumadin. 3. New recently diagnosed rectal cancer.  PAST SURGICAL HISTORY:  Hysterectomy, appendectomy, hip replacement, right knee surgery, tonsillectomy, cataract surgery, and carpal tunnel weight.  HOSPITAL COURSE:  [name] is a 77 year old functional elderly female with a history of a AFib with recently diagnosed rectal cancer who presents to her of PCP's office with complaints of chest discomfort. EKG was consistent with an ST-elevation MI inferiorly.  She was subsequently transferred to the [facility] for emergent heart catheterization.  She was found to have a significant 100% mid occlusion of the RCA, which subsequently was intervened upon with PTCA and placement of a bare-metal stent.  She did also have 95% proximal stenosis, and planned for staged PCI.  She was observed over the weekend and did fairly well.  PTY12 testing was performed after initiation on Plavix, which demonstrated a PRA of 270, baseline 317% and inhibition of 50%.  I discussed these findings with Dr.  Clifton James who feels that the patient should be continued on Plavix given risk of bleeding with new agent such as Effient and Brilinta.  She was brought back to the Cath Lab on August 29, 2010, and ultimately had successful PTCA and bare-metal stent placement to the mid circumflex artery.  She tolerated the procedure well.  As expected over the course of her hospitalization, her troponin peaked at 19.41 and subsequently came down.  Dr. Clifton James has seen and examined the patient today and feels she is stable for discharge.  DISCHARGE LABORATORY DATA:  WBC 7.9, hemoglobin 12, hematocrit 35.7, and platelet count 202.  Sodium 138, potassium 4.3, chloride 105, CO2 of 26, glucose 89, BUN 12, and creatinine 0.69.  LFTs were normal with the exception of elevated AST of 46 on August 26, 2010.  A1c is 5.9.  Total cholesterol 181, triglycerides 141, HDL 63, and LDL 98.  TSH 1.795.  STUDIES: 1. Cardiac catheterization on August 26, 2010, please see full report     for details. 2. Cardiac catheterization on August 29, 2010, please see full report     for details.  DISCHARGE MEDICATIONS: 1. Plavix 75 mg daily. 2. Metoprolol tartrate 25 mg b.i.d. 3.  Nitroglycerin sublingual 0.4 mg every 5 minutes as needed up to 3     doses for chest pain. 4. Crestor 40 mg nightly. 5. Aspirin 81 mg daily. 6. Bisacodyl 5 mg daily as needed for constipation. 7. Multivitamin 1 tablet daily every morning.  DISPOSITION:  [name] will be discharged in stable condition to home. She is instructed to increase activity slowly and not to lift anything over 5 pounds for 1 week, participate in sexual activities for 1 week, or drive for 2 days.  She is to follow a low-sodium, heart-healthy diet and to call or return if she notices any pain, swelling, bleeding, or pus at her cath site.  She will follow up with [facility] to see Tereso Newcomer, PA-C for her first posthospital visit, September 15, 2010, at 9 a.m.  Dr. Clifton James is  also in the office that day should any issues arise.  DURATION OF DISCHARGE ENCOUNTER:  Greater than 30 minutes including physician and PA time.     Ronie Spies, P.A.C.   ______________________________ Verne Carrow, MD    DD/MEDQ  D:  08/30/2010  T:  08/30/2010  Job:  [id number]  Electronically Signed by Verne Carrow MD on 08/30/2010 05:57:12 PM Electronically Signed by Ronie Spies  on 09/03/2010 02:16:44 PM

## 2010-08-30 NOTE — Cardiovascular Report (Signed)
  NAMEMARNETTE, [name] NO.:  [id number]  MEDICAL RECORD NO.:  [id number]  LOCATION:  6529                         FACILITY:  [facility]  PHYSICIAN:  Verne Carrow, MDDATE OF BIRTH:  [name], [name]  DATE OF PROCEDURE:  08/29/2010 DATE OF DISCHARGE:                           CARDIAC CATHETERIZATION   PRIMARY CARE PHYSICIAN:  Windle Guard, MD  PROCEDURE PERFORMED:  Percutaneous transluminal coronary angioplasty with placement of bare metal stent in the mid circumflex artery.  OPERATOR:  Verne Carrow, MD  INDICATIONS:  This is a 77 year old Caucasian female with a history of paroxysmal atrial fibrillation and rectal cancer who was admitted to [facility] as a code STEMI on the Friday, August 26, 2010.  The patient underwent emergent catheterization at that time and was found to have a totally occluded right coronary artery.  A bare metal stent was placed in the right coronary artery.  She did well over the weekend and was found to have normal left ventricular systolic function by echo.  We planned a staged PCI of the circumflex artery today.  PROCEDURE IN DETAIL:  The patient was brought to the main cardiac catheterization laboratory after signing informed consent for the procedure.  The right groin was prepped and draped in a sterile fashion. Lidocaine 1% was used for local anesthesia.  A 6-French sheath was inserted into the right femoral artery without difficulty.  The patient was given a bolus of Angiomax and drip was started.  She had been previously loaded with Plavix.  A XB 3.0 guiding catheter was used to selectively engage the left main artery.  When the ACT was greater than 200, I passed a Cougar intracoronary wire down the length of the circumflex vessel into the distal obtuse marginal branch.  A 2.5 x 12 mm balloon was inflated once in the area of tightest stenosis.  This area was heavily calcified, however, responded well to balloon  inflation.  A 3.0 x 12 mm Vision bare metal stent was then carefully positioned and deployed in the mid obtuse marginal branch.  A 3.25 x 8 mm noncompliant balloon was positioned inside the stent and was inflated to 16 atmospheres.  The stenosis was taken from 99% down to 0%.  There was an excellent angiographic result.  There were no immediate complications. The patient was taken to the recovery area in stable condition.  IMPRESSION:  Percutaneous transluminal coronary angioplasty with placement of bare metal stent in the mid circumflex artery.  RECOMMENDATIONS:  The patient will be continued on aspirin and Plavix for at least 1 month.  We will also continue her beta-blocker and statin.  Should she need surgery for her cancer, I would rather delay this for the next 4 weeks at least.     Verne Carrow, MD     CM/MEDQ  D:  08/29/2010  T:  08/29/2010  Job:  [id number]  cc:   Windle Guard, M.D.  Electronically Signed by Verne Carrow MD on 08/30/2010 05:57:08 PM

## 2010-08-31 ENCOUNTER — Encounter (INDEPENDENT_AMBULATORY_CARE_PROVIDER_SITE_OTHER): Payer: Self-pay | Admitting: Surgery

## 2010-09-05 ENCOUNTER — Encounter (INDEPENDENT_AMBULATORY_CARE_PROVIDER_SITE_OTHER): Payer: Self-pay | Admitting: General Surgery

## 2010-09-05 ENCOUNTER — Ambulatory Visit (INDEPENDENT_AMBULATORY_CARE_PROVIDER_SITE_OTHER): Payer: Self-pay | Admitting: Surgery

## 2010-09-07 ENCOUNTER — Encounter (INDEPENDENT_AMBULATORY_CARE_PROVIDER_SITE_OTHER): Payer: Self-pay | Admitting: Surgery

## 2010-09-07 ENCOUNTER — Telehealth: Payer: Self-pay | Admitting: Cardiovascular Disease

## 2010-09-07 ENCOUNTER — Encounter: Payer: Self-pay | Admitting: Cardiology

## 2010-09-07 ENCOUNTER — Ambulatory Visit (INDEPENDENT_AMBULATORY_CARE_PROVIDER_SITE_OTHER): Payer: Medicare Other | Admitting: Surgery

## 2010-09-07 VITALS — BP 158/66 | HR 76 | Temp 97.3°F | Ht 64.0 in | Wt 111.4 lb

## 2010-09-07 DIAGNOSIS — C20 Malignant neoplasm of rectum: Secondary | ICD-10-CM | POA: Insufficient documentation

## 2010-09-07 NOTE — Telephone Encounter (Signed)
[name]. Would like to speak with [facility] re pt. Dr. Corliss Skains needs pt to stop her plavix.

## 2010-09-07 NOTE — Progress Notes (Signed)
[name] is a 77 y.o. female.    Chief Complaint  Patient presents with  . Rectal Cancer    new pt- rec ca    HPI HPI This is a 77 year old female who lives independently and drives her own vehicle who presented with heme-positive stool after a recent physical examination. She was referred to [facility] for evaluation. The patient does report some recent constipation. She reports no abdominal pain or weight loss. She underwent a colonoscopy on August 18, 2010, which revealed a 2 cm non-circumferential right-sided rectal mass. She also has a few sigmoid diverticula. The mass was biopsied and revealed adenocarcinoma. She is referred to our office for possible transanal excision. Unfortunately, about a week after her colonoscopy, the patient suffered a myocardial infarction. She had 2 coronary stents placed by Dr. Clifton James and is now on Plavix and aspirin. Dr. Clifton James has told us that she needs to remain on her Plavix at least until August 13. She can hold her Plavix for 5 days prior to any planned surgery and then we'll need to resume after surgery.  Past Medical History  Diagnosis Date  . Arthritis   . Irregular heart beat   . Hyperlipidemia   . Heart attack   . Cancer     rectal  . Hearing loss   . Bilateral external ear infections     Past Surgical History  Procedure Date  . Abdominal hysterectomy   . Tonsillectomy   . Total hip arthroplasty   . Knee surgery   . External ear surgery   . Wrist surgery   . Cardiac surgery     2 stints put in    Family History  Problem Relation Age of Onset  . Kidney disease Mother   . Heart disease Father   . Cancer Brother     Social History History  Substance Use Topics  . Smoking status: Never Smoker   . Smokeless tobacco: Never Used  . Alcohol Use: No    Allergies  Allergen Reactions  . Effexor (Venlafaxine Hydrochloride)   . Sulfa Antibiotics Nausea Only  . Hydrocodone     Current Outpatient Prescriptions  Medication  Sig Dispense Refill  . aspirin 81 MG tablet Take 81 mg by mouth daily.        [name] Sodium 30-100 MG CAPS Take 100 mg by mouth as needed.        Marland Kitchen Clopidogrel Bisulfate (PLAVIX PO) 75 mg Daily.      . metoprolol tartrate (LOPRESSOR) 25 MG tablet BID times 48H.      . Multiple Vitamin (MULTIVITAMIN) tablet Take 1 tablet by mouth daily.        Marland Kitchen NITROSTAT 0.4 MG SL tablet Ad lib.      Marland Kitchen atenolol (TENORMIN) 25 MG tablet Take 25 mg by mouth daily.        . CRESTOR 40 MG tablet Daily.      . verapamil (CALAN) 80 MG tablet Take 80 mg by mouth 3 (three) times daily.          Review of Systems ROS Positive only for hearing loss, chronic ear infections, and current cancer Physical Exam Physical Exam   Blood pressure 158/66, pulse 76, temperature 97.3 F (36.3 C), height 5\' 4"  (1.626 m), weight 111 lb 6.4 oz (50.531 kg). WDWN in NAD HEENT:  EOMI, sclera anicteric Neck:  No masses, no thyromegaly Lungs:  CTA bilaterally; normal respiratory effort CV:  Regular rate and rhythm; no  murmurs Abd:  +bowel sounds, soft, non-tender, no masses, healed lower midline incision Rectal:  Palpable mobile mass at 5 cm with some gross blood.  Normal sphincter tone. Ext:  Well-perfused; no edema Skin:  Warm, dry; no sign of jaundice  Assessment/Plan 1.  Adenocarcinoma of the rectum  2.  Recent MI with new stents  Plan:  Will stage her rectal cancer with an endorectal ultrasound.  I have spoken with Dr. Michaell Cowing in our practice.  If the tumor is T1 or early T2, he will consider her for transanal microsurgery excision.  Otherwise, she might require a low anterior resection.  None of this can be scheduled before she can come off the Plavix and aspirin.  We will see her back after the ultrasound.  Breeley Bischof K. 09/07/2010, 12:11 PM

## 2010-09-07 NOTE — Telephone Encounter (Signed)
Spoke with Dr. Clifton James, patient needs to stay on Plavix at least until September 26, 2010. She may stop for 5 days prior to surgery and then should resume after the planned rectal cancer surgery. Will fax this to [facility] [place] [facility].

## 2010-09-07 NOTE — Patient Instructions (Signed)
We will schedule you for an endorectal ultrasound to determine the stage of the tumor.  After the ultrasound is complete, we will call you to schedule follow-up with either Dr. Corliss Skains or Dr. Michaell Cowing to discuss surgery.  We will also await cardiac clearance from your cardiologist.

## 2010-09-12 ENCOUNTER — Telehealth (INDEPENDENT_AMBULATORY_CARE_PROVIDER_SITE_OTHER): Payer: Self-pay | Admitting: General Surgery

## 2010-09-12 NOTE — Telephone Encounter (Signed)
Pls call Dr Dulce Sellar about Mrs. [name] he called today here in the ofc wanted to talk to you about her care call him on his cell [phone] Pattricia Boss

## 2010-09-15 ENCOUNTER — Ambulatory Visit (INDEPENDENT_AMBULATORY_CARE_PROVIDER_SITE_OTHER): Payer: Medicare Other | Admitting: Physician Assistant

## 2010-09-15 ENCOUNTER — Encounter: Payer: Self-pay | Admitting: Physician Assistant

## 2010-09-15 VITALS — BP 144/58 | HR 66 | Ht 64.0 in | Wt 112.0 lb

## 2010-09-15 DIAGNOSIS — R42 Dizziness and giddiness: Secondary | ICD-10-CM | POA: Insufficient documentation

## 2010-09-15 DIAGNOSIS — C20 Malignant neoplasm of rectum: Secondary | ICD-10-CM

## 2010-09-15 DIAGNOSIS — I2510 Atherosclerotic heart disease of native coronary artery without angina pectoris: Secondary | ICD-10-CM

## 2010-09-15 DIAGNOSIS — E785 Hyperlipidemia, unspecified: Secondary | ICD-10-CM

## 2010-09-15 MED ORDER — METOPROLOL TARTRATE 25 MG PO TABS: ORAL_TABLET | ORAL | Status: DC

## 2010-09-15 NOTE — Progress Notes (Signed)
History of Present Illness: Primary Cardiologist:  Dr. Verne Carrow  [name] is a 77 y.o. female who presents for post hospital follow up.  She has a history of paroxysmal atrial fibrillation and recently diagnosed rectal cancer.  She was admitted 7/13-7/17.  She presented with chest pain to her PCPs office.  EKG confirmed inferior STEMI and she was transported to [facility], [place].  She had emergent cath with placement of a BMS to the RCA for 100% occlusion.  She was noted to have a high grade proximal CFX 95%.  She was brought back to the cath lab a couple days later for staged PCI.  She was treated with BMS to the CFX.  Echo 7/14: EF 60-65%, mild RAE, mild to moderate AI, mild MR, moderate TR, RVE, PASP 47.  Labs: Potassium 4.3, creatinine 0.7, hemoglobin 12, peak troponin 19.41, TC 181, TG 141, HDL 63, LDL 98, TSH 1.795, PRU 270.  The patient denies chest pain, shortness of breath, syncope, orthopnea, PND or significant pedal edema.  She does have some weakness from time to time.  She will check her blood pressure and it is 112/58.  She typically has a higher blood pressure in the doctor's office.  She denies near syncope.  She will have an ultrasound towards the end of this month to stage her rectal cancer.  Surgery is pending in the near future.  Past Medical History  Diagnosis Date  . Arthritis   . Irregular heart beat   . Hyperlipidemia   . CAD (coronary artery disease)     a. s/p INF STEMI 7/12: tx with BMS to RCA;  b. cath 08/26/10: pLAD 30%, mLAD 50%, D1 40%, pCFX 95%, mRCA occluded;   c. staged PCI of pCFX with BMS;   d. echo 7/12:   EF 60-65%, mild RAE, mild to moderate AI, mild MR, moderate TR, RVE, PASP 47  . Rectal cancer     rectal  . Hearing loss   . Bilateral external ear infections     Current Outpatient Prescriptions  Medication Sig Dispense Refill  . aspirin 81 MG tablet Take 81 mg by mouth daily.        [name] Sodium 30-100 MG CAPS Take 100 mg by mouth  as needed.        Marland Kitchen Clopidogrel Bisulfate (PLAVIX PO) 75 mg Daily.      . CRESTOR 40 MG tablet Daily.      . metoprolol tartrate (LOPRESSOR) 25 MG tablet BID times 48H.      . Multiple Vitamin (MULTIVITAMIN) tablet Take 1 tablet by mouth daily.        Marland Kitchen NITROSTAT 0.4 MG SL tablet Ad lib.        Allergies: Allergies  Allergen Reactions  . Effexor (Venlafaxine Hydrochloride)   . Sulfa Antibiotics Nausea Only  . Hydrocodone     Vital Signs: BP 144/58  Pulse 66  Ht 5\' 4"  (1.626 m)  Wt 112 lb (50.803 kg)  BMI 19.22 kg/m2 Repeat blood pressure by me on the left 112/60   PHYSICAL EXAM: Well nourished, well developed, in no acute distress HEENT: normal Neck: no JVD Cardiac:  normal S1, S2; RRR; no murmur Lungs:  clear to auscultation bilaterally, no wheezing, rhonchi or rales Abd: soft, nontender, no hepatomegaly Ext: no edema; RFA site without hematoma or bruit Skin: warm and dry Neuro:  CNs 2-12 intact, no focal abnormalities noted  EKG:  Sinus rhythm, heart rate 66, sinus arrhythmia, left axis  deviation, nonspecific ST-T wave changes, first degree AV block with a PR interval of 224 ms  ASSESSMENT AND PLAN:

## 2010-09-15 NOTE — Assessment & Plan Note (Signed)
This may be related to low heart rate and blood pressure.  Decrease Lopressor to 12.5 mg twice a day.

## 2010-09-15 NOTE — Assessment & Plan Note (Addendum)
I will review the timing of when she can come off of Plavix for her upcoming surgery with Dr. Clifton James.  I discussed her case with Dr. Clifton James after the patient left the office.  She will need a minimum of 30 days post PCI on both Plavix and ASA (no earlier than August 17).  If she needs to come off of antiplatelet therapy for her surgery, she could hold them after this time with some cardiovascular risk.  Ideally, she should remain on dual antiplatelet therapy for 1 year post infarct and PCI.  I have discussed this potential risk with the patient today.

## 2010-09-15 NOTE — Assessment & Plan Note (Signed)
Schedule lipid and LFTs in 6-8 weeks.

## 2010-09-15 NOTE — Patient Instructions (Signed)
Decrease Metoprolol to one half a tab 2 times per day   Fasting lab work in 6 weeks  Appointment with Dr.McAlhany in 6 weeks  Referral to Cardiac Rehab at [facility].

## 2010-09-15 NOTE — Assessment & Plan Note (Signed)
Doing well post MI.  Continue aspirin and Plavix and statin.  Followup with Dr. Clifton James in 6 weeks.  Refer to cardiac rehab.

## 2010-10-04 ENCOUNTER — Telehealth: Payer: Self-pay | Admitting: Cardiovascular Disease

## 2010-10-04 NOTE — Telephone Encounter (Signed)
Patient states that she continues to remain tired although I believe some of this is related to her not being able to sleep at night. Usually her BP stays around 115/58. HR is usually around 65. Scott reviewed this with her and decreased her Metoprolol at the last office visit. She also has questions regarding her rectal surgery. Advised her to have her surgeon fax [place] a clearance letter once the surgery is planned. From the last office note, she can hold her Plavix prior to surgery. She is also c/o indigestion but does have rectal cancer. This is relieved by taking Tums. Informed her to contact us if she continues to experience this without relief from the Tums.

## 2010-10-04 NOTE — Telephone Encounter (Signed)
Pt wants to talk to you because she has several questions and per pt she would prefer to relay the questions to you because she has several questions and some explaining to do with them

## 2010-10-05 ENCOUNTER — Ambulatory Visit (HOSPITAL_COMMUNITY)
Admission: RE | Admit: 2010-10-05 | Discharge: 2010-10-05 | Disposition: A | Discharge status: Home / Self Care | Payer: Medicare Other | Source: Ambulatory Visit | Attending: Gastroenterology | Admitting: Gastroenterology

## 2010-10-05 DIAGNOSIS — I10 Essential (primary) hypertension: Secondary | ICD-10-CM | POA: Insufficient documentation

## 2010-10-05 DIAGNOSIS — C20 Malignant neoplasm of rectum: Secondary | ICD-10-CM | POA: Insufficient documentation

## 2010-10-05 DIAGNOSIS — Z79899 Other long term (current) drug therapy: Secondary | ICD-10-CM | POA: Insufficient documentation

## 2010-10-05 DIAGNOSIS — Z7982 Long term (current) use of aspirin: Secondary | ICD-10-CM | POA: Insufficient documentation

## 2010-10-10 ENCOUNTER — Other Ambulatory Visit (INDEPENDENT_AMBULATORY_CARE_PROVIDER_SITE_OTHER): Payer: Self-pay | Admitting: Surgery

## 2010-10-10 DIAGNOSIS — C20 Malignant neoplasm of rectum: Secondary | ICD-10-CM

## 2010-10-10 NOTE — Progress Notes (Signed)
Endorectal ultrasound staging at T2N0.  We will obtain a CT scan of the chest, abdomen, and pelvis to stage her cancer.

## 2010-10-12 ENCOUNTER — Ambulatory Visit
Admission: RE | Admit: 2010-10-12 | Discharge: 2010-10-12 | Disposition: A | Discharge status: Home / Self Care | Payer: Medicare Other | Source: Ambulatory Visit | Attending: Surgery | Admitting: Surgery

## 2010-10-12 DIAGNOSIS — C20 Malignant neoplasm of rectum: Secondary | ICD-10-CM

## 2010-10-12 MED ORDER — IOHEXOL 300 MG/ML  SOLN: 100.0000 mL | Freq: Once | INTRAMUSCULAR | Status: AC | PRN

## 2010-10-18 ENCOUNTER — Encounter (INDEPENDENT_AMBULATORY_CARE_PROVIDER_SITE_OTHER): Payer: Self-pay | Admitting: Surgery

## 2010-10-20 ENCOUNTER — Encounter (INDEPENDENT_AMBULATORY_CARE_PROVIDER_SITE_OTHER): Payer: Self-pay | Admitting: Surgery

## 2010-10-20 ENCOUNTER — Ambulatory Visit (INDEPENDENT_AMBULATORY_CARE_PROVIDER_SITE_OTHER): Payer: Medicare Other | Admitting: Surgery

## 2010-10-20 VITALS — BP 134/58 | HR 68 | Temp 97.6°F | Ht 64.0 in | Wt 111.8 lb

## 2010-10-20 DIAGNOSIS — C20 Malignant neoplasm of rectum: Secondary | ICD-10-CM

## 2010-10-20 NOTE — Patient Instructions (Addendum)
We will arrange consultations with Dr. Michaell Cowing - Surgery (transanal excision) and with Dr. Truett Perna at the [facility].  I will see you back after those appointments are complete.

## 2010-10-20 NOTE — Progress Notes (Signed)
The patient has been doing well from a cardiac standpoint.  She still has returned to her previous level of activity.  She has an appointment to see her Cardiologist in two weeks.  She underwent an endorectal ultrasound on 10/05/10, which revealed a right anterolateral cancer [id number].  She also underwent staging with a CT of the chest/ abd/ pelvis.  These scans were all negative for metastatic disease.    Overall, she is feeling well.  Appetite and bowel movements are normal.  No sign of GI bleeding.  She is anxious about having a cancer present, and is eager to proceed with any type of treatment.  We spent a considerable amount of time discussing potential treatment.  Ordinarily, we would recommend a low anterior resection with a primary anastomosis, possibly with a diverting loop ileostomy.  However, given her age and associated comorbidities (recent MI), we should consider other less invasive treatment options.  I have spoken with Dr. Michaell Cowing regarding possibly transanal resection (TEM).  This is not standard of care for a T2 lesion, but might be a better option for this patient.  We will refer to see Dr. Michaell Cowing to discuss possible TEM.  We will also refer her to see Dr. Truett Perna in Medical Oncology to discuss possible options for non-surgical or adjuvant therapy.  We will see her back after these consultations are complete.

## 2010-10-26 ENCOUNTER — Ambulatory Visit (INDEPENDENT_AMBULATORY_CARE_PROVIDER_SITE_OTHER): Payer: Medicare Other | Admitting: Surgery

## 2010-10-26 ENCOUNTER — Other Ambulatory Visit (INDEPENDENT_AMBULATORY_CARE_PROVIDER_SITE_OTHER): Payer: Self-pay

## 2010-10-26 ENCOUNTER — Encounter (INDEPENDENT_AMBULATORY_CARE_PROVIDER_SITE_OTHER): Payer: Self-pay | Admitting: Surgery

## 2010-10-26 VITALS — BP 130/60 | HR 60 | Temp 96.6°F | Ht 64.0 in | Wt 110.4 lb

## 2010-10-26 DIAGNOSIS — C20 Malignant neoplasm of rectum: Secondary | ICD-10-CM

## 2010-10-26 DIAGNOSIS — I2510 Atherosclerotic heart disease of native coronary artery without angina pectoris: Secondary | ICD-10-CM

## 2010-10-26 NOTE — Progress Notes (Signed)
Subjective:     Patient ID: [name], female   DOB: [birth year], 77 y.o.   MRN: [id number]  HPI  For: uT2 rectal cancer. Recent MI with bare-metal stents. Consideration of TEM partial proctectomy.  This is a pleasant 46 her female who is relatively active and independent. She was found to have a rectal mass. Biopsy shows cancer. Ultrasound calls it a small uT2 lesion.   She had an MI in July 2012.  She had bare metal stents placed.  Because of her recent myocardial infarction, there was understandable concern about proceeding with a larger resection such as a low anterior resection with diverting loop ileostomy. Therefore, the patient presented to consider if a partial proctectomy would be appropriate given her advanced age and coronary disease.  The patient feels better. Her energy is coming back. She's having regular bowel movements. She's here with a nurse who also offered some support.  She has a lot of questions about considering radiation only as an appropriate option. She does not want to do major resection.  Past Medical History  Diagnosis Date  . Arthritis   . Irregular heart beat   . Hyperlipidemia   . CAD (coronary artery disease)     a. s/p INF STEMI 7/12: tx with BMS to RCA;  b. cath 08/26/10: pLAD 30%, mLAD 50%, D1 40%, pCFX 95%, mRCA occluded;   c. staged PCI of pCFX with BMS;   d. echo 7/12:   EF 60-65%, mild RAE, mild to moderate AI, mild MR, moderate TR, RVE, PASP 47  . Rectal cancer     rectal  . Hearing loss   . Bilateral external ear infections    Past Surgical History  Procedure Date  . Abdominal hysterectomy   . Tonsillectomy   . Total hip arthroplasty   . Knee surgery   . External ear surgery   . Wrist surgery   . Cardiac surgery     2 stints put in   Current outpatient prescriptions:aspirin 81 MG tablet, Take 81 mg by mouth daily.  , Disp: , Rfl: ;  Casanthranol-Docusate Sodium 30-100 MG CAPS, Take 100 mg by mouth as needed.  , Disp: , Rfl: ;   Clopidogrel Bisulfate (PLAVIX PO), 75 mg Daily., Disp: , Rfl: ;  CRESTOR 40 MG tablet, Daily., Disp: , Rfl: ;  metoprolol tartrate (LOPRESSOR) 25 MG tablet, One half a tab 2 time per day, Disp: , Rfl:  Multiple Vitamin (MULTIVITAMIN) tablet, Take 1 tablet by mouth daily.  , Disp: , Rfl: ;  NITROSTAT 0.4 MG SL tablet, Ad lib., Disp: , Rfl:   Allergies  Allergen Reactions  . Effexor (Venlafaxine Hydrochloride)   . Sulfa Antibiotics Nausea Only  . Hydrocodone      Review of Systems  Constitutional: Negative for fever, chills, diaphoresis, appetite change and fatigue.  HENT: Negative for ear pain, sore throat, trouble swallowing, neck pain and ear discharge.   Eyes: Negative for photophobia, discharge and visual disturbance.  Respiratory: Negative for cough, choking, chest tightness and shortness of breath.   Cardiovascular: Negative for chest pain, palpitations and leg swelling.       Walks w/o problem  Gastrointestinal: Negative for nausea, vomiting, abdominal pain, diarrhea, constipation and rectal pain.       BM daily.   Genitourinary: Negative for dysuria, frequency and difficulty urinating.  Musculoskeletal: Negative for myalgias and gait problem.  Skin: Negative for color change, pallor and rash.  Neurological: Negative for dizziness, speech difficulty,  weakness and numbness.  Hematological: Negative for adenopathy.  Psychiatric/Behavioral: Negative for confusion and agitation. The patient is not nervous/anxious.        Objective:   Physical Exam  Constitutional: She is oriented to person, place, and time. She appears well-developed and well-nourished. No distress.  HENT:  Head: Normocephalic.  Mouth/Throat: Oropharynx is clear and moist. No oropharyngeal exudate.  Eyes: Conjunctivae and EOM are normal. Pupils are equal, round, and reactive to light. No scleral icterus.  Neck: Normal range of motion. Neck supple. No tracheal deviation present.  Cardiovascular: Normal  rate, regular rhythm and intact distal pulses.   Pulmonary/Chest: Effort normal and breath sounds normal. No respiratory distress. She exhibits no tenderness.  Abdominal: Soft. She exhibits no distension and no mass. There is no tenderness. There is no rebound and no guarding. Hernia confirmed negative in the right inguinal area and confirmed negative in the left inguinal area.       Thin, flat.  Low midline incision w/o hernia  Genitourinary: Vagina normal. No vaginal discharge found.       2cm firm ulcerated rectal mass.  Mobile.  Not fixed to vagina.  9:30-11 o'clock R anterior/lat.  5cm from anal verge  Musculoskeletal: Normal range of motion. She exhibits no tenderness.  Lymphadenopathy:    She has no cervical adenopathy.       Right: No inguinal adenopathy present.       Left: No inguinal adenopathy present.  Neurological: She is alert and oriented to person, place, and time. No cranial nerve deficit. She exhibits normal muscle tone. Coordination normal.  Skin: Skin is warm and dry. No rash noted. She is not diaphoretic. No erythema.  Psychiatric: She has a normal mood and affect. Her behavior is normal. Judgment and thought content normal.       Assessment:     Stage I (uT2) low rectal cancer in 77 y/o female in the setting of recent MI    Plan:     I discussed numerous options with her. I think she would benefit where with the removal of the tumor mass. I think from a technical standpoint, it would be quite achievable to do a low anterior resection. It is to be done laparoscopically.   Given how low the anastomosis is, she would require a diverting loop ileostomy. However, I worry that that's a big hit on a elderly woman with recent heart attack. She is in not interested in this and is worried it is too stressful of an operation.  A reasonable approach is consider a local resection by TEM. This would give good margins in and prevent local control. I think it would be wise to have  followup radiation afterwards for good local control as well. That does not burn bridges. That is a less stressful operation and she would have much shorter hospital stay. However, that is not without risks. I did discuss that technique with her.  The anatomy & physiology of the digestive tract was discussed.  The pathophysiology of the rectal pathology was discussed.  Natural history risks without surgery was discussed.   I feel the risks of no intervention will lead to serious problems that outweigh the operative risks; therefore, I recommended surgery.    Laparoscopic & open abdominal techniques were discussed.  I recommended we start with a partial proctectomy by transanal endoscopic microsurgery (TEM) for excisional biopsy to remove the pathology and hopefully cure and/or control the pathology.  This technique can offer less operative risk  and faster post-operative recovery.  Possible need for immediate or later abdominal surgery for further treatment was discussed.   Risks such as bleeding, abscess, reoperation, heart attack, death, and other risks were discussed.  Goals of post-operative recovery were discussed as well.  We will work to minimize complications.  An educational handout was given as well.  Questions were answered.  The patient expresses understanding & wishes to proceed with surgery.    She has been thinking about just doing radiation only for this. I cautioned her that that is not as good a cure rate as local resection with radiation. She actually looks rather spry for her age and recent heart attack. She seems to have pretty good quality of life. She seemed pretty functional. I do think it is reasonable for her to see a radiation oncologist to see what their recommendations are and what the risks and benefits would be.  She is due to see her cardiologist next week. I have a note cleariong her for surgery.  She had bare-metal stents, she can come off her Plavix and proceed with  surgery. I tried to reach him today. However, did not succeed. I will try again to talk to him to doublecheck what the what he feels is patient could tolerate.  I gave her my card. She wishes to call me next week after she thinks about things and discusses with the other specialties. Of note, we did discuss her at the GI tumor conference today. I think the other specialists are expecting to be involved with her care as well.

## 2010-10-26 NOTE — Patient Instructions (Signed)
See Radiation Oncology ([phone]) [phone]. To see if radiation therapy is an option of care for the rectal cancer.  See your cardiologist to see what surgery or treatments that you could tolerate.  Consider TEM surgery to remove the cancer:   o TRANSANAL ENDOSCOPIC MICROSURGERY o  o Anatomy of the rectum.   o  o The rectum is the lower part of the colon that resides in the pelvis.  It is the final location of stool before it is evacuated through the anus in the process of defecation.  The rectum is an area where unfortunately polyps or a cancer can develop.  In instances of most pre-cancerous lesions, a person often does not need to have a large resection of the rectum, but have it excised by an endoscope with loop and snares.  Unfortunately some polyps are too large to be safely excised through endoscopy and require surgery.  Classically, this is done through an open incision through a low anterior resection or abdominoperineal resection where part or the entire rectum is removed.  However, sometimes only part of a wall of the rectum needs to be removed.  o Transanal endoscopic microsurgery (TEM) was developed as a means to provide a good regional resection of part of the rectal wall for a pre-cancerous lesion or in resection of cancers in which the patient cannot tolerate an open surgery or has an extremely hostile abdomen that makes the resection very risky.   o Transanal endoscopic microsurgery (TEM) involves the patient to be placed under complete general anesthesia. The patient is usually positioned on their back in stirrups or sometimes on their bottom.  The anus is gently dilated and a metal tube is placed into the rectum.   o  o Through the tube, air is inflated and long instruments are used to help access and cut out the abnormal polyp or tumor.  The long instruments are also used to help sew the hole shut.  The specimen is then sent for pathology.  The procedure itself usually takes a few hours  of time.  The patient usually stays overnight.  When they can tolerate a regular diet and have adequate pain control they usually leave in one or two days.   o The advantage of TEM is that as opposed to a long hospital stay, patient recovery is much faster and are less likely to have bowel or other problems.  Careful pre-operative selection is essential to make sure that the patient is an appropriate candidate for the surgery.  Tumors or cancers that are very large or invasive usually are much more difficult to remove by this technique and are not considered the first option.  Persons who are most appropriate for this surgery are those with large polyps that have not become cancers or early cancers in patients who have high risks with larger surgery.    o  o Risks to the surgery are inherent but overall the procedure is less stressful and less risky to the patient than a classic partial colon resection.

## 2010-11-01 ENCOUNTER — Encounter: Payer: Medicare Other | Admitting: Oncology

## 2010-11-01 ENCOUNTER — Encounter (INDEPENDENT_AMBULATORY_CARE_PROVIDER_SITE_OTHER): Payer: Self-pay | Admitting: Surgery

## 2010-11-01 ENCOUNTER — Encounter (HOSPITAL_BASED_OUTPATIENT_CLINIC_OR_DEPARTMENT_OTHER): Payer: Medicare Other | Admitting: Oncology

## 2010-11-01 DIAGNOSIS — C20 Malignant neoplasm of rectum: Secondary | ICD-10-CM

## 2010-11-01 DIAGNOSIS — I252 Old myocardial infarction: Secondary | ICD-10-CM

## 2010-11-01 DIAGNOSIS — I10 Essential (primary) hypertension: Secondary | ICD-10-CM

## 2010-11-02 ENCOUNTER — Ambulatory Visit
Admission: RE | Admit: 2010-11-02 | Discharge: 2010-11-02 | Disposition: A | Discharge status: Home / Self Care | Payer: Medicare Other | Source: Ambulatory Visit | Attending: Radiation Oncology | Admitting: Radiation Oncology

## 2010-11-02 ENCOUNTER — Telehealth (INDEPENDENT_AMBULATORY_CARE_PROVIDER_SITE_OTHER): Payer: Self-pay

## 2010-11-02 DIAGNOSIS — Z7902 Long term (current) use of antithrombotics/antiplatelets: Secondary | ICD-10-CM | POA: Insufficient documentation

## 2010-11-02 DIAGNOSIS — C20 Malignant neoplasm of rectum: Secondary | ICD-10-CM | POA: Insufficient documentation

## 2010-11-02 DIAGNOSIS — I252 Old myocardial infarction: Secondary | ICD-10-CM | POA: Insufficient documentation

## 2010-11-02 DIAGNOSIS — Z7982 Long term (current) use of aspirin: Secondary | ICD-10-CM | POA: Insufficient documentation

## 2010-11-02 DIAGNOSIS — Z96649 Presence of unspecified artificial hip joint: Secondary | ICD-10-CM | POA: Insufficient documentation

## 2010-11-02 DIAGNOSIS — Z79899 Other long term (current) drug therapy: Secondary | ICD-10-CM | POA: Insufficient documentation

## 2010-11-02 DIAGNOSIS — I10 Essential (primary) hypertension: Secondary | ICD-10-CM | POA: Insufficient documentation

## 2010-11-02 DIAGNOSIS — Z803 Family history of malignant neoplasm of breast: Secondary | ICD-10-CM | POA: Insufficient documentation

## 2010-11-02 DIAGNOSIS — Z800 Family history of malignant neoplasm of digestive organs: Secondary | ICD-10-CM | POA: Insufficient documentation

## 2010-11-02 NOTE — Telephone Encounter (Signed)
Returned pt's voicemail message about getting scheduled for surgery. I spoke to Dr Michaell Cowing about pt wanting to go ahead and get scheduled after she saw Dr Truett Perna yesterday. Per Dr Michaell Cowing it ok to schedule b/c he spoke to Dr Truett Perna yesterday also about this pt. I notified pt that I was taking her info to scheduling for them to call her to get scheduled. I did advise her to stop her Plavix 5 days before her surgery and I was also mailing her a rectal prep to do before surgery.[name]

## 2010-11-03 ENCOUNTER — Encounter: Payer: Self-pay | Admitting: Cardiovascular Disease

## 2010-11-03 ENCOUNTER — Ambulatory Visit (INDEPENDENT_AMBULATORY_CARE_PROVIDER_SITE_OTHER): Payer: Medicare Other | Admitting: Cardiovascular Disease

## 2010-11-03 ENCOUNTER — Other Ambulatory Visit: Payer: Medicare Other | Admitting: *Deleted

## 2010-11-03 VITALS — BP 140/66 | HR 63 | Ht 64.0 in | Wt 109.8 lb

## 2010-11-03 DIAGNOSIS — I10 Essential (primary) hypertension: Secondary | ICD-10-CM | POA: Insufficient documentation

## 2010-11-03 DIAGNOSIS — E785 Hyperlipidemia, unspecified: Secondary | ICD-10-CM

## 2010-11-03 DIAGNOSIS — I2510 Atherosclerotic heart disease of native coronary artery without angina pectoris: Secondary | ICD-10-CM

## 2010-11-03 LAB — BASIC METABOLIC PANEL
CO2: 29 mEq/L (ref 19–32)
Chloride: 102 mEq/L (ref 96–112)
Creatinine, Ser: 0.7 mg/dL (ref 0.4–1.2)

## 2010-11-03 LAB — HEPATIC FUNCTION PANEL
Albumin: 4 g/dL (ref 3.5–5.2)
Alkaline Phosphatase: 64 U/L (ref 39–117)
Bilirubin, Direct: 0 mg/dL (ref 0.0–0.3)

## 2010-11-03 LAB — LIPID PANEL
LDL Cholesterol: 48 mg/dL (ref 0–99)
Total CHOL/HDL Ratio: 2
Triglycerides: 86 mg/dL (ref 0.0–149.0)

## 2010-11-03 NOTE — Patient Instructions (Signed)
Your physician recommends that you schedule a follow-up appointment in: 6 weeks  

## 2010-11-03 NOTE — Assessment & Plan Note (Signed)
Borderline elevated but will not change meds. I do not want to lower her BP too low before her surgery.

## 2010-11-03 NOTE — Assessment & Plan Note (Signed)
Continue statin. Check fasting lipids, LFTs and BMET today.

## 2010-11-03 NOTE — Progress Notes (Signed)
History of Present Illness:77 y.o. female with history of CAD, PAF, rectal cancer who is here today for cardiac follow up. She  up.  She was admitted 7/13-7/17 with an inferior STEMI. A bare metal stent was placed in the totally occluded RCA. She also had a high grade lesion in the proximal Circumflex and had staged intervention with a bare metal stent placed in the proximal Circumflex several days later.  Echo 7/14: EF 60-65%, mild RAE, mild to moderate AI, mild MR, moderate TR, RVE, PASP 47. She has recently diagnosed rectal cancer and has been seen by Dr. Karie Soda. There are plans for some type of surgical intervention, possible proctectomy.   She tells me that she is doing well. The patient denies chest pain, shortness of breath, syncope, orthopnea, PND or significant pedal edema.    Past Medical History  Diagnosis Date  . Arthritis   . Irregular heart beat   . Hyperlipidemia   . CAD (coronary artery disease)     a. s/p INF STEMI 7/12: tx with BMS to RCA;  b. cath 08/26/10: pLAD 30%, mLAD 50%, D1 40%, pCFX 95%, mRCA occluded;   c. staged PCI of pCFX with BMS;   d. echo 7/12:   EF 60-65%, mild RAE, mild to moderate AI, mild MR, moderate TR, RVE, PASP 47  . Rectal cancer     rectal  . Hearing loss   . Bilateral external ear infections     Past Surgical History  Procedure Date  . Abdominal hysterectomy   . Tonsillectomy   . Total hip arthroplasty   . Knee surgery   . External ear surgery   . Wrist surgery   . Cardiac surgery     2 stints put in    Current Outpatient Prescriptions  Medication Sig Dispense Refill  . aspirin 81 MG tablet Take 81 mg by mouth daily.        Marland Kitchen Clopidogrel Bisulfate (PLAVIX PO) 75 mg Daily.      . CRESTOR 40 MG tablet Daily.      Tery Sanfilippo Calcium (STOOL SOFTENER PO) Take by mouth as needed.        . metoprolol tartrate (LOPRESSOR) 25 MG tablet 25 mg. One half a tab 2 time per day      . Multiple Vitamin (MULTIVITAMIN) tablet Take 1 tablet by mouth  daily.        Marland Kitchen NITROSTAT 0.4 MG SL tablet Ad lib.        Allergies  Allergen Reactions  . Effexor (Venlafaxine Hydrochloride)   . Sulfa Antibiotics Nausea Only  . Hydrocodone     History   Social History  . Marital Status: Widowed    Spouse Name: N/A    Number of Children: N/A  . Years of Education: N/A   Occupational History  . Not on file.   Social History Main Topics  . Smoking status: Never Smoker   . Smokeless tobacco: Never Used  . Alcohol Use: No  . Drug Use: No  . Sexually Active:    Other Topics Concern  . Not on file   Social History Narrative  . No narrative on file    Family History  Problem Relation Age of Onset  . Kidney disease Mother   . Heart disease Father   . Cancer Brother     Review of Systems:  As stated in the HPI and otherwise negative.   BP 140/66  Pulse 63  Ht 5\' 4"  (1.626  m)  Wt 109 lb 12.8 oz (49.805 kg)  BMI 18.85 kg/m2  Physical Examination: General: Well developed, well nourished, NAD HEENT: OP clear, mucus membranes moist SKIN: warm, dry. No rashes. Neuro: No focal deficits Musculoskeletal: Muscle strength 5/5 all ext Psychiatric: Mood and affect normal Neck: No JVD, no carotid bruits, no thyromegaly, no lymphadenopathy. Lungs:Clear bilaterally, no wheezes, rhonci, crackles Cardiovascular: Regular rate and rhythm. No murmurs, gallops or rubs. Abdomen:Soft. Bowel sounds present. Non-tender.  Extremities: No lower extremity edema. Pulses are 2 + in the bilateral DP/PT.  JWJ:XBJYN rhythm, rate 63 bpm. Poor R wave progression through precordial leads.

## 2010-11-03 NOTE — Assessment & Plan Note (Addendum)
Stable. She has no chest pain, SOB, dizziness, near syncope or syncope. She is on ASA and Plavix post bare metal stent that was placed 2 months ago. Her Plavix can be held for her surgery. Would hold Plavix at least 5 days before the surgery and restart when safe from a surgical standpoint. Based on current ACC/AHA guidelines, no further workup prior to planned surgery. Continue current meds otherwise. We will be glad to follow her in the hospital with you.

## 2010-11-09 ENCOUNTER — Ambulatory Visit: Payer: Medicare Other | Admitting: Radiation Oncology

## 2010-11-10 ENCOUNTER — Encounter (INDEPENDENT_AMBULATORY_CARE_PROVIDER_SITE_OTHER): Payer: Medicare Other | Admitting: Surgery

## 2010-11-14 ENCOUNTER — Encounter (HOSPITAL_COMMUNITY): Payer: Medicare Other

## 2010-11-14 ENCOUNTER — Other Ambulatory Visit: Payer: Self-pay | Admitting: Dentistry

## 2010-11-14 ENCOUNTER — Other Ambulatory Visit (INDEPENDENT_AMBULATORY_CARE_PROVIDER_SITE_OTHER): Payer: Self-pay | Admitting: Surgery

## 2010-11-14 LAB — BASIC METABOLIC PANEL
Chloride: 103 mEq/L (ref 96–112)
GFR calc Af Amer: 86 mL/min — ABNORMAL LOW (ref >90)
Potassium: 4.7 mEq/L (ref 3.5–5.1)
Sodium: 141 mEq/L (ref 135–145)

## 2010-11-14 LAB — CBC
MCH: 31.1 pg (ref 26.0–34.0)
MCV: 94.7 fL (ref 78.0–100.0)
Platelets: 199 10*3/uL (ref 150–400)
RBC: 4.18 MIL/uL (ref 3.87–5.11)

## 2010-11-14 LAB — SURGICAL PCR SCREEN
MRSA, PCR: NEGATIVE
Staphylococcus aureus: NEGATIVE

## 2010-11-15 ENCOUNTER — Telehealth: Payer: Self-pay | Admitting: Cardiovascular Disease

## 2010-11-15 NOTE — Telephone Encounter (Signed)
10/2--called [facility] to speak with [name]--she was not available so LM --also faxed last o.v. Note ,which discusses pending surgery and instructions for ASA and PLAVIX--i feel this is enough info for clearance--nt

## 2010-11-15 NOTE — Telephone Encounter (Signed)
Per Helmut Muster calling from [facility]. Need on a written rx . Stating pt is clear for surgery. [name] is requesting this . Surgery is tomorrow.

## 2010-11-17 ENCOUNTER — Other Ambulatory Visit (INDEPENDENT_AMBULATORY_CARE_PROVIDER_SITE_OTHER): Payer: Self-pay | Admitting: Surgery

## 2010-11-17 ENCOUNTER — Inpatient Hospital Stay (HOSPITAL_COMMUNITY)
Admission: RE | Admit: 2010-11-17 | Discharge: 2010-11-30 | DRG: 329 | Disposition: A | Discharge status: Skilled Nursing Facility | Payer: Medicare Other | Source: Ambulatory Visit | Attending: Surgery | Admitting: Surgery

## 2010-11-17 DIAGNOSIS — I252 Old myocardial infarction: Secondary | ICD-10-CM

## 2010-11-17 DIAGNOSIS — E871 Hypo-osmolality and hyponatremia: Secondary | ICD-10-CM | POA: Diagnosis not present

## 2010-11-17 DIAGNOSIS — R5381 Other malaise: Secondary | ICD-10-CM | POA: Diagnosis present

## 2010-11-17 DIAGNOSIS — N735 Female pelvic peritonitis, unspecified: Secondary | ICD-10-CM | POA: Diagnosis present

## 2010-11-17 DIAGNOSIS — I2510 Atherosclerotic heart disease of native coronary artery without angina pectoris: Secondary | ICD-10-CM | POA: Diagnosis present

## 2010-11-17 DIAGNOSIS — K660 Peritoneal adhesions (postprocedural) (postinfection): Secondary | ICD-10-CM | POA: Diagnosis present

## 2010-11-17 DIAGNOSIS — K560 Paralytic ileus: Secondary | ICD-10-CM | POA: Diagnosis not present

## 2010-11-17 DIAGNOSIS — E785 Hyperlipidemia, unspecified: Secondary | ICD-10-CM | POA: Diagnosis present

## 2010-11-17 DIAGNOSIS — C20 Malignant neoplasm of rectum: Secondary | ICD-10-CM

## 2010-11-17 DIAGNOSIS — Z96649 Presence of unspecified artificial hip joint: Secondary | ICD-10-CM

## 2010-11-17 DIAGNOSIS — I10 Essential (primary) hypertension: Secondary | ICD-10-CM | POA: Diagnosis present

## 2010-11-17 DIAGNOSIS — Z9861 Coronary angioplasty status: Secondary | ICD-10-CM

## 2010-11-17 DIAGNOSIS — Z01812 Encounter for preprocedural laboratory examination: Secondary | ICD-10-CM

## 2010-11-17 DIAGNOSIS — F29 Unspecified psychosis not due to a substance or known physiological condition: Secondary | ICD-10-CM | POA: Diagnosis not present

## 2010-11-17 DIAGNOSIS — H908 Mixed conductive and sensorineural hearing loss, unspecified: Secondary | ICD-10-CM | POA: Diagnosis present

## 2010-11-17 DIAGNOSIS — Z7902 Long term (current) use of antithrombotics/antiplatelets: Secondary | ICD-10-CM

## 2010-11-17 DIAGNOSIS — J984 Other disorders of lung: Secondary | ICD-10-CM | POA: Diagnosis present

## 2010-11-17 DIAGNOSIS — I4891 Unspecified atrial fibrillation: Secondary | ICD-10-CM | POA: Diagnosis present

## 2010-11-17 DIAGNOSIS — D62 Acute posthemorrhagic anemia: Secondary | ICD-10-CM | POA: Diagnosis not present

## 2010-11-17 HISTORY — PX: PARTIAL PROCTECTOMY BY TEM: SHX6011

## 2010-11-18 ENCOUNTER — Inpatient Hospital Stay (HOSPITAL_COMMUNITY): Payer: Medicare Other

## 2010-11-18 LAB — BASIC METABOLIC PANEL
BUN: 17 mg/dL (ref 6–23)
Creatinine, Ser: 0.87 mg/dL (ref 0.50–1.10)
GFR calc Af Amer: 65 mL/min — ABNORMAL LOW (ref >90)
GFR calc non Af Amer: 56 mL/min — ABNORMAL LOW (ref >90)
Potassium: 4.4 mEq/L (ref 3.5–5.1)

## 2010-11-18 LAB — DIFFERENTIAL
Basophils Absolute: 0 10*3/uL (ref 0.0–0.1)
Eosinophils Absolute: 0 10*3/uL (ref 0.0–0.7)
Eosinophils Relative: 0 % (ref 0–5)
Lymphocytes Relative: 7 % — ABNORMAL LOW (ref 12–46)
Monocytes Relative: 2 % — ABNORMAL LOW (ref 3–12)
Neutrophils Relative %: 69 % (ref 43–77)

## 2010-11-18 LAB — CBC
HCT: 34.6 % — ABNORMAL LOW (ref 36.0–46.0)
MCHC: 34.1 g/dL (ref 30.0–36.0)
MCV: 92.8 fL (ref 78.0–100.0)
RDW: 12.3 % (ref 11.5–15.5)

## 2010-11-19 LAB — BASIC METABOLIC PANEL
GFR calc Af Amer: 86 mL/min — ABNORMAL LOW (ref >90)
GFR calc non Af Amer: 74 mL/min — ABNORMAL LOW (ref >90)
Potassium: 4.1 mEq/L (ref 3.5–5.1)
Sodium: 132 mEq/L — ABNORMAL LOW (ref 135–145)

## 2010-11-19 LAB — CBC
MCHC: 32.9 g/dL (ref 30.0–36.0)
RDW: 12.5 % (ref 11.5–15.5)

## 2010-11-20 DIAGNOSIS — I4891 Unspecified atrial fibrillation: Secondary | ICD-10-CM

## 2010-11-20 LAB — DIFFERENTIAL
Basophils Absolute: 0 10*3/uL (ref 0.0–0.1)
Lymphocytes Relative: 6 % — ABNORMAL LOW (ref 12–46)
Monocytes Relative: 2 % — ABNORMAL LOW (ref 3–12)
WBC Morphology: INCREASED

## 2010-11-20 LAB — CBC
MCH: 31.8 pg (ref 26.0–34.0)
Platelets: 137 10*3/uL — ABNORMAL LOW (ref 150–400)
RBC: 3.11 MIL/uL — ABNORMAL LOW (ref 3.87–5.11)
WBC: 11.6 10*3/uL — ABNORMAL HIGH (ref 4.0–10.5)

## 2010-11-20 LAB — GLUCOSE, CAPILLARY: Glucose-Capillary: 112 mg/dL — ABNORMAL HIGH (ref 70–99)

## 2010-11-20 NOTE — Consult Note (Signed)
[name], [name] NO.:  [id number]  MEDICAL RECORD NO.:  [id number]  LOCATION:  1442                         FACILITY:  [facility]  PHYSICIAN:  Andreas Blower, MD       DATE OF BIRTH:  04/28/1916  DATE OF CONSULTATION: DATE OF DISCHARGE:                                CONSULTATION   REFERRING PHYSICIAN:  Dr. Biagio Quint.  PRIMARY CARE PHYSICIAN:  Dr. Windle Guard.  CARDIOLOGIST:  Dr. Fredricka Bonine.  GASTROENTEROLOGIST:  Dr. Evette Cristal.  ONCOLOGIST:  Dr. Truett Perna.  SURGEON:  Dr. Karie Soda.  Clinical question to be answered, help manage her AFib and other medical conditions.  HISTORY OF PRESENT ILLNESS:  [name] is a 77 year old Caucasian female with history of paroxysmal AFib, coronary artery disease, recent ST elevation MI on June 2012, hyperlipidemia, rectal cancer, status post resection recently on November 17, 2010, pulmonary nodules, and hyperlipidemia, who presented on November 17, 2010 for resection of rectal cancer on November 17, 2010.  Postoperatively, the patient had a little bit of nausea, but has been tolerating clear liquids over the last few days.  Today, however, the patient was found to be in AFib with RVR with the heart rate in the 150s.  The patient had good mentation.  As a result, the hospitalist service was consulted to help manage her AFib as well as other medical conditions.  The patient denies any recent fever or chills.  Currently, denies any nausea or vomiting.  Denies any chest pain or shortness of breath.  Denies any abdominal pain, has been having good bowel movements.  Denies any headaches or vision changes.  The patient also had low blood pressures intermittently during the course of hospital stay.  As a result, the patient's metoprolol dose was held yesterday, which was continued today.  REVIEW OF SYSTEMS:  All systems were reviewed with the patient was positive as per HPI, otherwise all other systems are negative.  PAST  MEDICAL HISTORY: 1. Recent rectal cancer, status post resection on November 17, 2010. 2. History of paroxysmal AFib, was felt not to be a candidate for     anticoagulation. 3. History of coronary artery disease with recent ST elevation MI on     August 26, 2010, status post bare-metal stent to the mid right     coronary artery and another bare-metal stent to mid circumflex     artery in July 2012. 4. Hyperlipidemia. 5. History of left radial/ulnar styloid fracture in July 2011. 6. History of pulmonary nodules, has had followup CT on October 12, 2010, which showed small pulmonary nodules thought to be likely     benign. 7. History of left tympanic membrane perforation, status post     sensorineural hearing loss as well as conductive hearing loss.  She     has had multiple ear surgeries in both of her ears. 8. History of right knee surgery. 9. History of right hip replacement. 10.History of hysterectomy approximately 55 years ago.  SOCIAL HISTORY:  The patient does not smoke, does not drink any alcohol, lives by herself.  She has been very active up until recently.  FAMILY HISTORY:  Father  had heart attack and stroke, had 3 brothers who all have had heart attacks.  Another brother has had colon cancer, status post surgery, but ultimately died from heart attack.  Mother died during pregnancy, had 1 sister who died from heart attack and another sister died from a stroke.  PHYSICAL EXAMINATION:  VITAL SIGNS:  Temperature is 98.8, heart rate 86, respiration 18, blood pressure is 106/58, and saturating at 99% on 2 L of oxygen. GENERAL:  The patient is alert and oriented, not appeared to be in acute distress.  She was laying in bed comfortably. HEENT:  Extraocular motions are intact.  Pupils equal and round, had moist mucous membranes. NECK:  Supple. HEART:  Regular with S1 and S2. LUNGS:  Scattered crackles at bases bilaterally. ABDOMEN:  Soft and nondistended with hypoactive bowel  sounds.  No guarding or rebound tenderness, had generalized tenderness to palpation. EXTREMITIES:  The patient had good peripheral pulses with trace edema. NEUROLOGIC:  Cranial nerves II through XII grossly intact, has 5/5 motor strength in upper as well as lower extremities.  RADIOLOGY/IMAGING:  The patient had abdominal series on November 18, 2010, which showed a stable cardiomegaly.  No change in hyperaeration.  No bowel obstruction.  No free air.  LABORATORY DATA:  CBC shows a white count of 11.6, hemoglobin 9.9, hematocrit 28.9, and platelet count 137.  Electrolytes, normal except sodium is 132, BUN is 15, and creatinine 0.67.  ASSESSMENT AND PLAN: 1. History of paroxysmal atrial fibrillation, now with rapid     ventricular rate.  The patient to be continued on metoprolol with     hold parameters.  [facility] has been consulted and will     evaluate the patient.  The patient has been given amiodarone bolus.     Subsequently, the patient has converted to sinus rhythm.  Further     management as per Cardiology.  The patient had a 2-D echocardiogram     on August 27, 2010, which showed her ejection fraction was 60% to     65%.  Wall motion was normal.  The patient was previously deemed     not a candidate for anticoagulation previously.  The patient is     currently on aspirin. 2. Colorectal adenocarcinoma, per pathology status post resection     on November 17, 2010.  Further management as per Surgery at this     time.  The patient is currently on ertapenem and also on clear     liquid diet.  Further management as per Surgery.  The patient to     follow up with Oncology as an outpatient for further treatment     and options after surgery. 3. History of coronary artery disease, status post ST elevation     myocardial infarction in July 2012, currently on aspirin, had bare-     metal stent placed in the right RCA and left circumflex.  Further     management as per Cardiology.   The patient is currently not     complaining of any chest pain. 4. Hyperlipidemia.  Continue rosuvastatin. 5. Prophylaxis with heparin for subcutaneous prophylaxis.  Thank you for the consultation.  We will continue to follow.  Time spent on consultation, talking to the patient, consultants, and coordinating care was 45 minutes.   Andreas Blower, MD   SR/MEDQ  D:  11/20/2010  T:  11/20/2010  Job:  [id number]  Electronically Signed by Wardell Heath Walsie Smeltz  on 11/20/2010 09:32:12 PM

## 2010-11-21 ENCOUNTER — Telehealth (INDEPENDENT_AMBULATORY_CARE_PROVIDER_SITE_OTHER): Payer: Self-pay

## 2010-11-21 LAB — CBC
Hemoglobin: 9.3 g/dL — ABNORMAL LOW (ref 12.0–15.0)
MCHC: 33.1 g/dL (ref 30.0–36.0)

## 2010-11-21 LAB — BASIC METABOLIC PANEL
Calcium: 8.4 mg/dL (ref 8.4–10.5)
Glucose, Bld: 89 mg/dL (ref 70–99)
Potassium: 3.6 mEq/L (ref 3.5–5.1)
Sodium: 131 mEq/L — ABNORMAL LOW (ref 135–145)

## 2010-11-21 NOTE — Telephone Encounter (Signed)
Added pt to GI Cancer Conf. For 11-30-10 per Dr Michaell Cowing.Hulda Humphrey

## 2010-11-22 ENCOUNTER — Inpatient Hospital Stay (HOSPITAL_COMMUNITY): Payer: Medicare Other

## 2010-11-22 DIAGNOSIS — K929 Disease of digestive system, unspecified: Secondary | ICD-10-CM

## 2010-11-22 HISTORY — PX: OTHER SURGICAL HISTORY: SHX169

## 2010-11-22 LAB — COMPREHENSIVE METABOLIC PANEL
Albumin: 1.9 g/dL — ABNORMAL LOW (ref 3.5–5.2)
BUN: 10 mg/dL (ref 6–23)
Calcium: 8.2 mg/dL — ABNORMAL LOW (ref 8.4–10.5)
Creatinine, Ser: 0.64 mg/dL (ref 0.50–1.10)
GFR calc Af Amer: 87 mL/min — ABNORMAL LOW (ref >90)
Glucose, Bld: 94 mg/dL (ref 70–99)
Total Protein: 5.2 g/dL — ABNORMAL LOW (ref 6.0–8.3)

## 2010-11-22 LAB — CBC
HCT: 28 % — ABNORMAL LOW (ref 36.0–46.0)
Hemoglobin: 9.5 g/dL — ABNORMAL LOW (ref 12.0–15.0)
MCH: 31 pg (ref 26.0–34.0)
MCHC: 33.9 g/dL (ref 30.0–36.0)
RDW: 12.9 % (ref 11.5–15.5)

## 2010-11-22 LAB — SURGICAL PCR SCREEN
MRSA, PCR: NEGATIVE
Staphylococcus aureus: NEGATIVE

## 2010-11-22 LAB — MAGNESIUM: Magnesium: 2.1 mg/dL (ref 1.5–2.5)

## 2010-11-22 MED ORDER — IOHEXOL 300 MG/ML  SOLN
80.0000 mL | Freq: Once | INTRAMUSCULAR | Status: AC | PRN
  Administered 2010-11-22: 80 mL via INTRAVENOUS

## 2010-11-23 LAB — BASIC METABOLIC PANEL
Chloride: 101 mEq/L (ref 96–112)
Creatinine, Ser: 0.53 mg/dL (ref 0.50–1.10)
GFR calc Af Amer: >90 mL/min (ref >90)
Potassium: 3.6 mEq/L (ref 3.5–5.1)
Sodium: 134 mEq/L — ABNORMAL LOW (ref 135–145)

## 2010-11-23 LAB — CBC
MCV: 91.7 fL (ref 78.0–100.0)
Platelets: 190 10*3/uL (ref 150–400)
RDW: 13 % (ref 11.5–15.5)
WBC: 8.6 10*3/uL (ref 4.0–10.5)

## 2010-11-24 LAB — CARDIAC PANEL(CRET KIN+CKTOT+MB+TROPI)
Relative Index: INVALID (ref 0.0–2.5)
Relative Index: INVALID (ref 0.0–2.5)
Total CK: 79 U/L (ref 7–177)
Troponin I: <0.3 ng/mL (ref <0.30)

## 2010-11-24 LAB — CBC
HCT: 28.2 % — ABNORMAL LOW (ref 36.0–46.0)
Platelets: 217 10*3/uL (ref 150–400)
RDW: 13.2 % (ref 11.5–15.5)
WBC: 8.7 10*3/uL (ref 4.0–10.5)

## 2010-11-24 LAB — CREATININE, SERUM
GFR calc Af Amer: 88 mL/min — ABNORMAL LOW (ref >90)
GFR calc non Af Amer: 76 mL/min — ABNORMAL LOW (ref >90)

## 2010-11-24 LAB — POTASSIUM: Potassium: 4.1 mEq/L (ref 3.5–5.1)

## 2010-11-25 LAB — CBC
HCT: 34 % — ABNORMAL LOW (ref 36.0–46.0)
MCHC: 33.5 g/dL (ref 30.0–36.0)
Platelets: 298 10*3/uL (ref 150–400)
RDW: 13.3 % (ref 11.5–15.5)

## 2010-11-25 LAB — CULTURE, ROUTINE-ABSCESS

## 2010-11-25 LAB — GLUCOSE, CAPILLARY
Glucose-Capillary: 59 mg/dL — ABNORMAL LOW (ref 70–99)
Glucose-Capillary: 101 mg/dL — ABNORMAL HIGH (ref 70–99)

## 2010-11-25 LAB — BASIC METABOLIC PANEL
BUN: 14 mg/dL (ref 6–23)
Creatinine, Ser: 0.7 mg/dL (ref 0.50–1.10)
GFR calc Af Amer: 85 mL/min — ABNORMAL LOW (ref >90)
GFR calc non Af Amer: 73 mL/min — ABNORMAL LOW (ref >90)
Potassium: 4.1 mEq/L (ref 3.5–5.1)

## 2010-11-26 LAB — CBC
Hemoglobin: 10.4 g/dL — ABNORMAL LOW (ref 12.0–15.0)
MCHC: 34.2 g/dL (ref 30.0–36.0)
RDW: 13.4 % (ref 11.5–15.5)
WBC: 11.7 10*3/uL — ABNORMAL HIGH (ref 4.0–10.5)

## 2010-11-26 LAB — CREATININE, SERUM
Creatinine, Ser: 0.69 mg/dL (ref 0.50–1.10)
GFR calc non Af Amer: 73 mL/min — ABNORMAL LOW (ref >90)

## 2010-11-27 LAB — ANAEROBIC CULTURE

## 2010-12-01 NOTE — Discharge Summary (Signed)
[name], KASTER NO.:  [id number]  MEDICAL RECORD NO.:  [id number]  LOCATION:  1444                         FACILITY:  [facility]  PHYSICIAN:  Joyce Sportsman, MD     DATE OF BIRTH:  [birth year]  DATE OF ADMISSION:  11/17/2010 DATE OF DISCHARGE:  11/29/2010                              DISCHARGE SUMMARY   PRIMARY CARE PHYSICIAN:  [name], M.D.  CARDIOLOGIST:  Joyce Carrow, MD.  GASTROENTEROLOGIST:  [name], M.D.  RADIOLOGIST:  Dr. Terisa Hamilton.  SURGICAL ONCOLOGIST:  [name], M.D.  SURGEON:  Joyce Sportsman, MD.  PRINCIPAL DIAGNOSIS:  T2 rectal cancer of proximal anterior rectum.  POSTOPERATIVE DIAGNOSIS:  Pelvic abscess.  PROCEDURES PERFORMED: 1. Transanal endoscopic microsurgical partial proctectomy of rectal     cancer on November 19, 2010. 2. Diagnostic laparoscopy, wash out the pelvic abscess and everting     colostomy on November 22, 2010.  OTHER DIAGNOSES:  Include, 1. Coronary artery disease with ST-elevated myocardial infarction,     status post catheterization August 26, 2010, with bare metal stent     and Plavix. 2. Intermittent paroxysmal atrial fibrillation, resolved controlled     with beta-blocker, and now in sinus rhythm. 3. Status post hip replacement. 4. Status post hysterectomy. 5. Status post appendectomy. 6. Status post right knee surgery for osteoarthritis. 7. Status post tonsillectomy and status post cataract extraction. 8. Status post carpal tunnel release. 9. Coronary artery disease as noted above.  DISCHARGE MEDICATIONS:  Noted in the system, but include, 1. Aspirin 81 mg daily. 2. Plavix 75 mg p.o. daily. 3. Ciprofloxacin 500 mg p.o. b.i.d. x5 more days. 4. Magic mouthwash 30 mL p.o. q.6 hours p.r.n. sore throat. 5. Metoprolol 25 mg p.o. t.i.d. per Dr. Gibson Hamilton adjustment. 6. Multivitamins daily. 7. Psyllium 1 packet p.o. daily. 8. Crestor 40 mg p.o. q.h.s. 9. Acetaminophen 325-650 mg p.o.  q.6 hours p.r.n. pain. 10.Oxycodone 2.5-5 mg p.o. q.6 hours p.r.n. breakthrough severe pain. 11.Nitroglycerin 0.4 mg sublingual p.r.n. chest pain. 12.Guaifenesin and Mucinex 600 mg p.o. b.i.d. p.r.n. congestion. 13.Lip balm b.i.d. 14.Excedrin, aspirin/Tylenol/caffeine 2 tabs p.o. q.12 hours p.r.n.     headache.  Pathology shows a colorectal adenocarcinoma with negative margins T2. 2.2 cm size, no lymphovascular invasion, no perineural invasion, and no tumor perforation.  Level differentiate, not noted.  HOSPITAL COURSE:  [name] is a 77 year old female with a recent myocardial infection and newly diagnosed rectal cancer.  She is normal and quite independent and functional.  She was found to have a mobile or rectal cancer staged as T2.  It was felt, she would not tolerate a low anterior resection and, therefore, she was sent to me for local resection by TEM.  She underwent this on the first day.  Technically, things went well.  Postoperatively, she did struggle with some abdominal discomfort and pain.  X-rays showed no evidence of any free air or perforation.  She was placed on bowel rest with IV antibiotics.  She calmed down and felt better.  She did have a brief episode of atrial fibrillation for which she went on the step down unit.  She resolved with increasing her  metoprolol and after 1 dose of amiodarone, she was increased on her metoprolol to 25 t.i.d. and had no further cardiac events on telemetry. She stabilized transfer to telemetry in a few days after that.  Her pain had gone down and improved, and hemodynamically okay.  However, when we tried to advance her to solid diet, she had worsening abdominal pain.  We got a CAT scan which did show some dots of pelvic air and a fluid collection suspicious for an abscess.  We took her in to the operating room with diagnostic laparoscopy and rigid proctoscopy.  She had no intraperitoneal perforation, but she did have and  anterior pelvic abscess.  We broke up the loculations and washed down.  We placed a drain.  We found submucosal separation, but no complete perforation into the peritoneal cavity.  However given her fragile state and abscess near the area, we decided to divert it with a little colostomy.  Postoperatively, she was monitored in a step down unit.  She was placed on IV antibiotics.  Her abdominal pain resolved over the next few days. Her ostomy began to be productive and she was started on a diet.  She had complained of a sore mouth, but that did not alleviate with magic mouthwash.  It did not improve thrush, but we did give her 1 dose of oral fluconazole just in case.  She was rather deconditioned, but with the help of physical therapy, she has walked in the hallways.  She had had episodes of some confusion in the sundown, but had a couple of good days of sleeping, was feeling much better, more oriented with only minimal confusion.  She had family including her daughter and niece with her most days.  Because she was walking in the hallways, was tolerating solid food, supplemental shakes, had good ostomy function, was urinating on her own, had minimal pain requiring only p.r.n. Tylenol at this point; I thought it would be a reason for her to be transferred to another hospital, given her deconditioned state, we recommended the skilled nursing facility.  She is interested in [facility], and we are anticipating her to be able to discharge there with following instructions: 1. She is to return to clinic to see me next week.  At that point, I     anticipate removing the drain as long as there is no change in     character.  Currently, in serosanguineous. 2. She can use ice packs, heating pack, Tylenol, oxycodone in     combination for pain control.  Try to minimize narcotics, as I     think it was sedating and a bit confusing to her at times.     Oxycodone, she seemed to tolerate best of the  numerous options we     gave her.  Fentanyl IV would be a backup, but hold off on that. 3. Eat as tolerated, diet as tolerated.  Recommend supplemental     shakes.  She seemed to tolerate Glucerna and pasta. 4. She should continue her cardiac medications as noted above.     Currently, her metoprolol has been increased to 25 mg p.o. t.i.d.     I want to defer to when to taper that back down or if it should be     maintained there per her cardiologist. 5. She should work with Physical Therapy and Occupation Therapy to     have increased physical activity and tolerance. 6. She should follow up with Radiation and Medical  Oncology to     consider post-adjuvant chemoradiation therapy for better local     control.  Since she is diverted, we could try and do this before     the colostomy take down is in their interest. 7. She should call if she has worsening fevers, chills, sweats,     nausea, vomiting, uncontrolled pain, diarrhea etc.     Joyce Sportsman, MD     SCG/MEDQ  D:  11/29/2010  T:  11/29/2010  Job:  [id number]  Electronically Signed by Karie Soda MD on 12/01/2010 11:10:52 AM

## 2010-12-01 NOTE — Op Note (Signed)
Joyce, Hamilton NO.:  [id number]  MEDICAL RECORD NO.:  [id number]  LOCATION:  1222                         FACILITY:  [facility]  PHYSICIAN:  Ardeth Sportsman, MD     DATE OF BIRTH:  10-Apr-1916  DATE OF PROCEDURE:  11/22/2010 DATE OF DISCHARGE:                              OPERATIVE REPORT   SURGEON:  Ardeth Sportsman, MD.  ASSISTANT:  Adolph Pollack, M.D.  PREOPERATIVE DIAGNOSES: 1. Intraperitoneal free air and pelvic fluid. 2. Possible pelvic abscesses and leak. 3. Status post partial proctectomy for rectal cancer.  POSTOPERATIVE DIAGNOSES: 1. Right posterior lateral pelvic abscess. 2. Central and anterior deep pelvic abscess. 3. Anastomotic dehiscence without free leak into the peritoneal     cavity.  PROCEDURE PERFORMED: 1. Diagnostic laparoscopy. 2. Laparoscopic lysis of adhesions x45 minutes which is about third of     the case. 3. Rigid proctoscopy. 4. Laparoscopic drainage of pelvic abscesses x2. 5. Laparoscopic diverting loop sigmoid colostomy.  ANESTHESIA: 1. General anesthesia. 2. Local anesthetic and a field block around all port sites and     colostomy site.  SPECIMENS:  Pelvic abscess for culture.  DRAINS:  A 19-french Blake drain goes around the right pelvic gutter into the anterior through deep pelvis.  ESTIMATED BLOOD LOSS:  Less than 30 mL.  COMPLICATIONS:  None apparent.  INDICATIONS:  [name] is a 77 year old female with a T2 rectal cancer, felt to be a poor operative candidate for low anterior resection.  She underwent partial proctectomy by transanal endoscopic microsurgical resection on November 17, 2010.  She had some incidences of diarrhea and abdominal pain.  She seemed to improve over several days; however, today, she began to have increasing abdominal pain with advancement of diet.  Based on concern, CT scan was done.  It showed a moderate amount of pelvic gas and some pelvic fluid suspicious for leak and/or  abscesses.  Her abdominal pain worsened.  Based on concerns, Dr. Abbey Chatters and I were concerned about the risks of a leak and with failure to improvement on bowel rest and IV antibiotics for several days.  I had discussion with the patient and her daughter and son. Options were discussed including continued bowel rest and IV antibiotics versus laparoscopic versus open exploration.  Risks, benefits, and alternatives were discussed.  My concern was, despite her advanced age, she could not tolerate worsening peritonitis and she had worsening symptoms today.  Questions were answered and all agreed to proceed with surgery.  OPERATIVE FINDINGS:  She had some moderate adhesions from prior hysterectomy.  She had a loop of ileum that was slipped down into the pelvis lying over the anterior rectum.  It was walling off an abscess in the right posterior lateral pelvis and anterior pelvis as well.  There was separation of the anastomosis in the right anterior aspect for about 4 mm.  There was no free extravasation of gas for a negative air leak test.  She did not have diffuse intraabdominal peritonitis. Peritonitis seemed to be continuing down the pelvis with the intraloop adhesions.  DESCRIPTION OF PROCEDURE:  Informed consent was confirmed.  Patient was already on IV antibiotics.  She underwent general anesthesia without difficulty.  She was positioned in low lithotomy with her right arm tucked.  Her left arm had an arterial line placed.  She had Foley catheter sterilely placed.  Her abdomen and pelvis were prepped and draped in sterile fashion.  Surgical time-out confirmed our plan.  I placed a #5 mm port supraumbilically through a direct cut down to sound technique.  The entry initially was in the preperitoneal plane, but then after re-dissection, I was able to enter into the true peritoneal cavity.  I induced carbon dioxide insufflation.  Camera inspection noted no injury.  I placed 5 mm  ports in the right lower quadrant and right subcostal region.  I placed another 5 mm port in the left midabdomen.  I could see no frank peritonitis in the abdomen.  However, I can see loop of bowel and sigmoid colon adherent down into the pelvis.  I found the cecum and mobilized the cecum and terminal ileum and elevating the terminal ileal mesentery off the posterior lateral pelvis and abscess pocket.  We aspirated and sent that for culture.  I did further dissection until I came down to the pelvis.  I mobilized the sigmoid colon off the left pelvic brim and freed the ileal mesentery off that as well.  Eventually, I came down and I was able to get down and free the anterior rectum off the vaginal cuff and encountered another deeper pocket as well.  I did copious irrigation.  I clamped the sigmoid colon.  Dr. Abbey Chatters scrubbed down and did rigid proctoscopy.  He could easily find anastomosis, was able to be palpable digitally.  He saw mucosal separation of about 4 mm in the right anterior aspect around 10 to 11 o'clock region.  He insufflated with me clamping the sigmoid colon while under water.  There was no extravasation of gas or air bubbles.  There was a negative air leak test.  However, given the numerous pelvic abscesses and dense adherence, I felt, and Dr. Abbey Chatters agreed that fecal diversion will be best to allow this resolve as there is a possibility of a contained leak that was still tenuous at best.  Therefore, mobilized the left colon up to the splenic flexure.  I did copious irrigation down the pelvis of over 6L until I had nice healthy and clear return.  There was some moderate inflammatory peel on the terminal serosa and the anterior rectum.  I freed off a few spots that came off easily, but did not do aggressive debridement.  There seem to be no breach on the vaginal cuff.  I went ahead and placed drain noted above.  I chose the mobile section of the sigmoid  colon.  I secured the drain using 2-0 silk vertical mattress stitch in the right lower quadrant port site.  I chose a left lower quadrant paramedial port site and opened up a 3.5 cm wound circularly. I got down and split the anterior rectus fascia primarily transversally.  I split the rectus muscles vertically and got into the peritoneal cavity easily.  I was able to bring up the sigmoid colon up through that wound and chose an area of proximal sigmoid colon that was not inflamed and eviscerated it out 5 cm without difficulty.  I evacuated the ports and closed the remaining port sites using 4-0 Monocryl stitch.  I placed sterile dressings on the other port sites.  I matured the loop colostomy using interrupted 3-0 Vicryl stitches.  I did  put 4 stitches more deeply to the anterior rectus fascia on superolateral, superomedial, inferolateral, and inferomedial.  I opened up the sigmoid colon longitudinally on the antimesenteric site and then brooked the colostomy using interrupted 3-0 Vicryl stitches.  I was able to easily pass my finger straight into the peritoneal cavity.  The proximal limb is cephalad, the distal limb is caudad.  I placed sterile appliance.  The patient was extubated and sent to recovery room and guarded, but stable condition.  She is not on any pressors.  Given her fragile state I would like to watch her at least in the step-down unit.  I am about to discuss operative findings with the patient's family.     Ardeth Sportsman, MD     SCG/MEDQ  D:  11/22/2010  T:  11/23/2010  Job:  [id number]  Electronically Signed by Karie Soda MD on 12/01/2010 11:10:46 AM

## 2010-12-01 NOTE — Op Note (Signed)
NAMEFAYLYNN, STAMOS NO.:  [id number]  MEDICAL RECORD NO.:  [id number]  LOCATION:  DAYL                         FACILITY:  [facility]  PHYSICIAN:  Ardeth Sportsman, MD     DATE OF BIRTH:  1916-07-03  DATE OF PROCEDURE:  11/17/2010 DATE OF DISCHARGE:                              OPERATIVE REPORT   PRIMARY CARE PHYSICIAN:  Windle Guard, MD  CARDIOLOGIST:  Verne Carrow, MD  GASTROENTEROLOGIST:  Graylin Shiver, MD  RADIOLOGIST:  Maryln Gottron, MD  ONCOLOGIST:  Leighton Roach. Truett Perna, MD  SURGEON:  Ardeth Sportsman, MD  ASSISTANT:  RN.  PREOPERATIVE DIAGNOSIS:  uT2 rectal cancer in a patient with recent myocardial infarction.  POSTOPERATIVE DIAGNOSIS:  uT2 rectal cancer in a patient with recent myocardial infarction.  PROCEDURE PERFORMED:  Transanal endoscopic microsurgery (TEM) partial proctectomy of rectal cancer.  ANESTHESIA: 1. General anesthesia. 2. Anorectal block with long-acting bupivacaine.  SPECIMEN:  Rectal cancer.  The white pins are proximal.  The olive-green pins are distal.  The pink pins are right anterolateral.  The burgundy dark red pins are left anterolateral.  ESTIMATED BLOOD LOSS:  50 mL.  COMPLICATIONS:  None apparent.  INDICATIONS:  Ms. Schwenn is a 77 year old active female who had rectal bleeding, was found to have a mass in the anterior rectum.  Biopsies showed cancer.  Endorectal ultrasound showed a T2 lesion.  Because of her given location, she will require low anterior resection with protective loop ileostomy and standard of care.  Given her recent myocardial infarction in her advanced age, I was felt that the patient was not interested in having a more definitive operation.  We had a long discussion between Dr. Corliss Skains and myself in the group about options with her and she agreed to a partial proctectomy for primary removal of the cancer with probable post adjuvant chemoradiation therapy.  Techniques, risks, benefits,  and alternatives were discussed.  Questions were answered and she and her family agreed to proceed.  OPERATIVE FINDINGS:  She had a 2 cm elevated mass on the mid, distal anterior rectum.  It was primarily right anterolateral.  It was 5 to 7 cm from the anal verge.  Closure is at rest at about 3 cm from the anal verge.  DESCRIPTION OF PROCEDURE:  Informed consent was confirmed.  The patient underwent general anesthesia without any difficulty.  She had received IV antibiotics.  She had sequential compression devices active during the entire case.  She was positioned prone and split leg.  Care was made to properly position her safely.  I did digital exam to confirm placement.  Her perineum was prepped and draped in a sterile fashion after Foley catheter had been sterilely placed.  Surgical time-out confirmed our plan.  I did gentle anal dilation, I was able to place the 4 cm TEO Storz rigid proctoscope in.  I induced carbon dioxide and luminal insufflation.  I could easily locate the mass on the anterior rectal wall.  It had not invaded through the vagina.  It was mobile.  I chose 15 mm margins around the mass with tip-point cautery circumferentially.  I began to transect through the rectum  1.5 cm proximal to the mass in the anterior midline.  I came through completely and came around the right lateral and left lateral aspects of the polyp and I scored the mucosa circumferentially all the way around.  I transitioned over to ultrasonic dissection to help elevate the rectal cancer off the rectovaginal septum anteriorly and mildly superiorly until I had it completely excised.  She had a little mild bleeding from some feeder vessels, but I was able to ensure hemostasis with controlled ultrasonic Harmonic dissection.  I packed the wound.  I marked the rectal cancer as noted above.  I walked down to the Pathology and showed it to them for proper orientation.  I returned to be scrubbed.   Hemostasis was excellent.  Upon inspection, I felt that the proximal rectum had contracted a little too high up to try and do a transanal closure with a [place]. Thereof, I did an endoluminal TEM closure.  I used 2-0 PDS horizontal mattress interrupted stitches along the circumference x4 and brought the wound transversely together.  I then did running 2-0 Vicryl stitches with a UR-6 needle.  I did in a running fashion from the right lateral end to the center part of the wound and then the center part to the left lateral wound.  The interrupted horizontal mattress stitches, I closed using a extracorporeal knot pusher.  The running stitches of Vicryl, I closed by putting Laparoties at both ends, 1 preloaded and 1 placed at the end.  Her mucosa was a little bit fragile and oozing.  I did find one more bleeder that I controlled with a figure-of-eight 2-0 Vicryl stitch.  I did copiously irrigation.  Mucosa had good apposition.  I could easily palpate the closure digitally with my finger.  It involved about a little over 50% of the anterior and anterolateral circumference.  There was no stricturing.  Hemostasis was good.  I placed a Gelfoam cylinder into the rectum for improved hemostasis.  I waited and reinspected and hemostasis was good, there was no active oozing after 5 minutes.  The patient is being extubated and sent to the recovery room in stable condition.  I discussed postoperative care with the patient in the office and just prior to surgery.  I am about to discuss with family as well.     Ardeth Sportsman, MD     SCG/MEDQ  D:  11/17/2010  T:  11/17/2010  Job:  [id number]  cc:   Windle Guard, M.D. Fax: [phone]  Wilmon Arms. Corliss Skains, M.D. [address] [place] [address] [place]  Maryln Gottron, M.D. Fax: [phone]  Ladene Artist, M.D. Fax: [phone]  Graylin Shiver, M.D. Fax: [phone]  Electronically Signed by Karie Soda MD on 12/01/2010 11:10:26 AM

## 2010-12-02 NOTE — Consult Note (Signed)
[name], [name]               ACCOUNT NO.:  [id number]  MEDICAL RECORD NO.:  [id number]  LOCATION:                                 FACILITY:  PHYSICIAN:  Gerrit Friends. Dietrich Pates, MD, FACCDATE OF BIRTH:  10-27-1916  DATE OF CONSULTATION: DATE OF DISCHARGE:                                CONSULTATION   REFERRING PHYSICIAN:  Sandria Bales. Ezzard Standing, MD  PRIMARY CARE PHYSICIAN:  Windle Guard, MD  PRIMARY CARDIOLOGIST:  Verne Carrow, MD  HISTORY OF PRESENT ILLNESS:  A 77 year old woman with recent myocardial infarction, referred for assessment of atrial fibrillation.  [name] was admitted to hospital in July with an inferior ST-segment elevation and acute MI, for which a bare metal stent was placed in the RCA with a subsequent elective stent placement in the proximal circumflex leaving her with no significant residual coronary disease.  LV systolic function was normal.  She has done very well, but subsequently had rectal bleeding and was found to have rectal carcinoma which was resected yesterday.  In the early morning hours, atrial fibrillation with rapid ventricular response was noted.  Intravenous amiodarone was started, and she converted to sinus rhythm.  The patient has not had documented atrial fibrillation in the past, but she once experienced a prolonged episode of palpitations for which EMS was summoned.  By the time they arrived, symptoms had resolved.  Her physician advised her that this was likely an episode of PAF.  TSH was normal when last assessed few months ago.  Her echocardiogram reportedly shows no valvular abnormalities and preserved left ventricular systolic function.  PAST MEDICAL HISTORY:  Otherwise remarkably benign, and mostly limited to elective surgeries including: 1. Hysterectomy. 2. Appendectomy. 3. Total hip arthroplasty. 4. Right knee surgery. 5. Tonsillectomy. 6. Carpal tunnel release. 7. Bilateral cataract extractions.  She has been  diagnosed with mild hyperlipidemia and mild hypertension.  ALLERGIES: 1. Sulfa. 2. Effexor. 3. Hydrocodone. 4. Vicodin.  HOME MEDICATIONS:  Modest, and as listed in the medical reconciliation form.  SOCIAL HISTORY:  No use of alcohol or tobacco.  She is widowed and lives alone, but has multiple family members and friends who assist her.  FAMILY HISTORY:  No coronary artery disease in first-degree relatives.  REVIEW OF SYSTEMS:  Occasional headaches; arthritic discomfort, particularly in the hands.  She received a blood transfusion with her hip arthroplasty.  She requires corrective lenses for near and far vision and also has implanted lenses.  She has bilateral hearing aids. She suffered perforation of the left tympanic membrane in the past.  She has no history of psychological disorders and no sleeping disorder.  All other systems reviewed and are negative.  PHYSICAL EXAMINATION:  GENERAL:  Thin, vivacious elderly woman with remarkably intact mentation. VITAL SIGNS:  The temperature is 98.1, heart rate 75 and regular, respirations 20, blood pressure 95/55, and O2 saturation 99% on 2 L. HEENT:  Anicteric sclerae; EOMs full; normal oral mucosa. NECK:  No jugular venous distention; normal carotid upstrokes without bruits. ENDOCRINE:  No thyromegaly. HEMATOPOIETIC:  No adenopathy. SKIN:  No significant lesions. THORAX:  Mild-to-moderate kyphosis. LUNGS:  Decreased breath sounds throughout with poor inspiratory effort; minimal scattered  rales appreciated. CARDIAC:  Normal first and second heart sounds; modest systolic murmur. ABDOMEN:  Soft with mild diffuse tenderness and normal bowel sounds. EXTREMITIES:  No edema; distal pulses intact. NEUROLOGIC:  Symmetric strength and tone; normal cranial nerves.  LABORATORY DATA:  Notable for progressive anemia with initial hemoglobin of 11.8, subsequently 9.9.  MCV is normal.  There is borderline thrombocytopenia with platelets in the  130,000, as well as a left shift with 92% neutrophils.  She is mildly hyponatremic with a sodium of 132, but has a creatinine of only 0.7.  Upright chest x-ray was clear.  EKG shows atrial fibrillation with a rapid ventricular response; ventricular rate was 156 bpm.  An IVCD was present, as well as delayed R-wave progression suggesting previous anterior myocardial infarction.  Left axis was present.  ST-T wave abnormalities were consistent with LVH or lateral ischemia.  Compared with a previous tracing of November 03, 2010, QRS interval had increased, R-wave progression was more delayed, and the ST-T wave abnormality in leads I and L was new.  IMPRESSION:  [name] presents with probably recurrent paroxysmal atrial fibrillation in the setting of physiologic stress related to surgery.  She tolerated her arrhythmia well without dyspnea, diaphoresis, lightheadedness, or chest discomfort; however, her EKG suggests a degree of myocardial ischemia.  Chest x-ray will be repeated to verify that ST-segments have returned to normal.  Cardiac markers might be positive in this setting, but would not affect therapy.  Blood pressure is on the low side and may limit the therapy that can be provided to her.  Amiodarone carries a significant risk of adverse effects and will be discontinued after the current infusion has been completed.  Beta blocker should be increased as tolerated, but may be limited by the patient's blood pressure.  Digoxin and/or diltiazem could be added in the future if atrial fibrillation recurs with a rapid ventricular response.  Clopidogrel along with aspirin provides a moderate degree of protection from thromboembolism.  While she is on these drugs, I certainly would not add warfarin.  In 1-2 years, if atrial fibrillation continues to occur, consideration could be given to modification of her antithrombotic therapy.  This episode does not require prolonged hospitalization.   She can be discharged when otherwise ready from a surgical standpoint, and we will arrange followup in the office.     Gerrit Friends. Dietrich Pates, MD, [facility]     RMR/MEDQ  D:  11/20/2010  T:  11/20/2010  Job:  [id number]  Electronically Signed by [place] Bing MD [facility] on 12/02/2010 03:03:44 PM

## 2010-12-05 ENCOUNTER — Telehealth (INDEPENDENT_AMBULATORY_CARE_PROVIDER_SITE_OTHER): Payer: Self-pay

## 2010-12-05 NOTE — Telephone Encounter (Signed)
Called [facility] to see if pt had follow up appt's scheduled and she is scheduled to see Dr Truett Perna on 12-08-10 @11 :15/ [facility]

## 2010-12-08 ENCOUNTER — Encounter (INDEPENDENT_AMBULATORY_CARE_PROVIDER_SITE_OTHER): Payer: Self-pay | Admitting: Surgery

## 2010-12-08 ENCOUNTER — Ambulatory Visit (INDEPENDENT_AMBULATORY_CARE_PROVIDER_SITE_OTHER): Payer: Medicare Other | Admitting: Surgery

## 2010-12-08 VITALS — BP 104/60 | HR 70 | Temp 96.2°F | Resp 14 | Ht 64.5 in | Wt 108.8 lb

## 2010-12-08 DIAGNOSIS — C20 Malignant neoplasm of rectum: Secondary | ICD-10-CM

## 2010-12-08 DIAGNOSIS — K1370 Unspecified lesions of oral mucosa: Secondary | ICD-10-CM

## 2010-12-08 DIAGNOSIS — R630 Anorexia: Secondary | ICD-10-CM

## 2010-12-08 DIAGNOSIS — K1379 Other lesions of oral mucosa: Secondary | ICD-10-CM

## 2010-12-08 NOTE — Patient Instructions (Signed)
Colostomy Home Guide A colostomy is an opening for stool to leave your body when a medical condition prevents it from leaving through the usual opening (rectum). During a surgery, a piece of large intestine (colon) is brought through a hole in the abdominal wall. The new opening is called a stoma or ostomy. A bag or pouch fits over the stoma to catch stool and gas. Your stool may be liquid, somewhat pasty, or formed. CARING FOR YOUR STOMA  Normally, the stoma looks a lot like the inside of your cheek: pink, red, and moist. At first it may be swollen, but this swelling will decrease within 6 weeks. Keep the skin around your stoma clean and dry. You can gently wash your stoma and the skin around your stoma in the shower with a clean, soft washcloth. If you develop any skin irritation, your caregiver may give you a stoma powder or ointment to help heal the area. Do not use any products other than those specifically given to you by your caregiver.  Your stoma should not be uncomfortable. If you notice any stinging or burning, your pouch may be leaking, and the skin around your stoma may be coming into contact with stool. This can cause skin irritation. If you notice stinging, replace your pouch with a new one and discard the old one. OSTOMY POUCHES  The pouch that fits over the ostomy can be made up of either 1 or 2 pieces. A one-piece pouch has a skin barrier piece and the pouch itself in one unit. A two-piece pouch has a skin barrier with a separate pouch that snaps on and off of the skin barrier. Either way, you should empty the pouch when it is only ? to  full. Do not let more stool or gas build up. This could cause the pouch to leak. Some ostomy bags have a built-in gas release valve. Ostomy deodorizer (5 drops) can be put into the pouch to prevent odor. Some people use ostomy lubricant drops inside the pouch to help the stool slide out of the bag more easily and completely.  EMPTYING YOUR OSTOMY POUCH    You may get lessons on how to empty your pouch from a wound-ostomy nurse before you leave the hospital. Here are the basic steps:  Wash your hands with soap and water.   Sit far back on the toilet.   Put several pieces of toilet paper into the toilet water. This will prevent splashing as you empty the stool into the toilet bowl.   Unclip or unvelcro the tail end of the pouch.   Unroll the tail and empty stool into the toilet.   Clean the tail with toilet paper.   Reroll the tail, and clip or velcro it closed.   Wash your hands again.  CHANGING YOUR OSTOMY POUCH  Change your ostomy pouch about every 3 to 4 days for the first 6 weeks, then every 5 to7 days. Always change the bag sooner if there is any leakage or you begin to notice any discomfort or irritation of the skin around the stoma. When possible, plan to change your ostomy pouch before eating or drinking as this will lessen the chance of stool coming out during the pouch change. A wound-ostomy nurse may teach you how to change your pouch before you leave the hospital. Here are the basic steps:  Lay out your supplies.   Wash your hands with soap and water.   Carefully remove the old pouch.     Wash the stoma and allow it to dry. Men may be advised to shave any hair around the stoma very carefully. This will make the adhesive stick better.   Use the stoma measuring guide that comes with your pouch set to decide what size hole you will need to cut in the skin barrier piece. Choose the smallest possible size that will hold the stoma but will not touch it.   Use the guide to trace the circle on the back of the skin barrier piece. Cut out the hole.   Hold the skin barrier piece over the stoma to make sure the hole is the correct size.   Remove the adhesive paper backing from the skin barrier piece.   Squeeze stoma paste around the opening of the skin barrier piece.   Clean and dry the skin around the stoma again.   Carefully  fit the skin barrier piece over your stoma.   If you are using a two-piece pouch, snap the pouch onto the skin barrier piece.   Close the tail of the pouch.   Put your hand over the top of the skin barrier piece to help warm it for about 5 minutes, so that it conforms to your body better.   Wash your hands again.  DIET TIPS   Continue to follow your usual diet.   Drink about eight 8 oz glasses of water each day.   You can prevent gas by eating slowly and chewing your food thoroughly.   If you feel concerned that you have too much gas, you can cut back on gas-producing foods, such as:   Spicy foods.   Onions and garlic.   Cruciferous vegetables (cabbage, broccoli, cauliflower, Brussels sprouts).   Beans and legumes.   Some cheeses.   Eggs.   Fish.   Bubbly (carbonated) drinks.   Chewing gum.  GENERAL TIPS   You can shower with or without the bag in place.   Always keep the bag on if you are bathing or swimming.   If your bag gets wet, you can dry it with a blow-dryer set to cool.   Avoid wearing tight clothing directly over your stoma so that it does not become irritated or bleed. Tight clothing can also prevent stool from draining into the pouch.   It is helpful to always have an extra skin barrier and pouch with you when traveling. Do not leave them anywhere too warm, as parts of them can melt.   Do not let your seat belt rest on your stoma. Try to keep the seat belt either above or below your stoma, or use a tiny pillow to cushion it.   You can still participate in sports, but you should avoid activities in which there is a risk of getting hit in the abdomen.   You can still have sex. It is a good idea to empty your pouch prior to sex. Some people and their partners feel very comfortable seeing the pouch during sex. Others choose to wear lingerie or a T-shirt that covers the device.  SEEK IMMEDIATE MEDICAL CARE IF:  You notice a change in the size or color of  the stoma, especially if it becomes very red, purple, black, or pale white.   You have bloody stools or bleeding from the stoma.   You have abdominal pain, nausea, vomiting, or bloating.   There is anything unusual protruding from the stoma.   You have irritation or red skin around the stoma.     No stool is passing from the stoma.   You have diarrhea (requiring more frequent than normal pouch emptying).  Document Released: 02/02/2003 Document Revised: 10/12/2010 Document Reviewed: 06/29/2010 [employer] Patient Information 2012 [employer]. 

## 2010-12-08 NOTE — Progress Notes (Signed)
Subjective:     Patient ID: [name], female   DOB: 1916-10-01, 77 y.o.   MRN: [id number]  HPI  Patient Care Team: Kaleen Mask as PCP - General (Family Medicine) Verne Carrow, MD as Consulting Physician (Cardiology) Graylin Shiver as Consulting Physician (Gastroenterology) Maryln Gottron as Consulting Physician (Radiation Oncology) Lucile Shutters, MD as Consulting Physician (Internal Medicine)  This patient is a 77 y.o.female who presents today for surgical evaluation.   Diagnosis: pT2uN0(pNX) adenocarcinoma proximal anterior rectum  Procedure: TEM partial proctectomy with delayed diverting colostomy secondary to pelvic abscess. 10/4 & 11/22/2010  Patient comes a feeling okay. Her energy levels improve. She's working physical therapy. She is emptying her colostomyA pouch okay. No major vaginal drainage or rectal bleeding rectal drainage.    She did have an episode where she fell down the first day at the nursing facility. She claims she was confused. Her left temporal area was stitched. She feels much better & more clear now. She is off all antibiotics.  Her main complaint is that she feels her mouth sore. She already has a burning. However she just feels like she does not have much of a taste. Her appetite is down. She comes today with a friend. No nausea vomiting. No fever chills or sweats. The drain is then thin serous output.  Past Medical History  Diagnosis Date  . Arthritis   . Irregular heart beat   . Hyperlipidemia   . CAD (coronary artery disease)     a. s/p INF STEMI 7/12: tx with BMS to RCA;  b. cath 08/26/10: pLAD 30%, mLAD 50%, D1 40%, pCFX 95%, mRCA occluded;   c. staged PCI of pCFX with BMS;   d. echo 7/12:   EF 60-65%, mild RAE, mild to moderate AI, mild MR, moderate TR, RVE, PASP 47  . Hearing loss   . Bilateral external ear infections   . Rectal cancer     rectal    Past Surgical History  Procedure Date  . Abdominal hysterectomy   .  Tonsillectomy   . Total hip arthroplasty   . Knee surgery   . External ear surgery   . Wrist surgery   . Cardiac surgery     2 stints put in  . Partial protectomy 11/17/2010    with colostomy 10/9    History   Social History  . Marital Status: Widowed    Spouse Name: N/A    Number of Children: N/A  . Years of Education: N/A   Occupational History  . Not on file.   Social History Main Topics  . Smoking status: Never Smoker   . Smokeless tobacco: Never Used  . Alcohol Use: No  . Drug Use: No  . Sexually Active:    Other Topics Concern  . Not on file   Social History Narrative  . No narrative on file    Family History  Problem Relation Age of Onset  . Kidney disease Mother   . Heart disease Father   . Cancer Brother     Current outpatient prescriptions:Psyllium (METAMUCIL PO), Take by mouth daily.  , Disp: , Rfl: ;  aspirin 81 MG tablet, Take 81 mg by mouth daily.  , Disp: , Rfl: ;  Clopidogrel Bisulfate (PLAVIX PO), 75 mg Daily., Disp: , Rfl: ;  CRESTOR 40 MG tablet, Daily., Disp: , Rfl: ;  Docusate Calcium (STOOL SOFTENER PO), Take by mouth as needed.  , Disp: , Rfl:  metoprolol tartrate (LOPRESSOR) 25 MG tablet, 25 mg. One half a tab 2 time per day, Disp: , Rfl: ;  Multiple Vitamin (MULTIVITAMIN) tablet, Take 1 tablet by mouth daily.  , Disp: , Rfl: ;  NITROSTAT 0.4 MG SL tablet, Ad lib., Disp: , Rfl:   Allergies  Allergen Reactions  . Effexor (Venlafaxine Hydrochloride)   . Sulfa Antibiotics Nausea Only  . Hydrocodone Nausea Only       Review of Systems  Constitutional: Negative for fever, chills and diaphoresis.  HENT: Positive for sore throat. Negative for ear pain, nosebleeds, congestion, rhinorrhea, sneezing, trouble swallowing, neck stiffness, voice change, postnasal drip, sinus pressure and ear discharge.   Eyes: Negative for photophobia and visual disturbance.  Respiratory: Negative for cough and choking.   Cardiovascular: Negative for chest pain  and palpitations.  Gastrointestinal: Negative for nausea, vomiting, abdominal pain, diarrhea, constipation, anal bleeding and rectal pain.  Genitourinary: Negative for dysuria, frequency and difficulty urinating.  Musculoskeletal: Negative for myalgias and gait problem.  Skin: Negative for color change, pallor and rash.  Neurological: Negative for dizziness, speech difficulty, weakness and numbness.  Hematological: Negative for adenopathy.  Psychiatric/Behavioral: Negative for confusion and agitation. The patient is not nervous/anxious.        Objective:   Physical Exam  Constitutional: She is oriented to person, place, and time. She appears well-developed and well-nourished. No distress.  HENT:  Head: Normocephalic.    Mouth/Throat: Oropharynx is clear and moist. No oropharyngeal exudate.  Eyes: Conjunctivae and EOM are normal. Pupils are equal, round, and reactive to light. No scleral icterus.  Neck: Normal range of motion. No tracheal deviation present.  Cardiovascular: Normal rate and intact distal pulses.   Pulmonary/Chest: Effort normal. No respiratory distress. She exhibits no tenderness.  Abdominal: Soft. Bowel sounds are normal. She exhibits no distension. There is no tenderness. Hernia confirmed negative in the right inguinal area and confirmed negative in the left inguinal area.       Incisions clean with normal healing ridges.  No hernias.  LLQ colostomy with firm stool.  RLQ bulb drain serous - I removed.  Genitourinary: No vaginal discharge found.       Perianal skin clean with good hygiene.  No pruritis.  Normal sphincter tone.  No fissure.  No abscess/fistula.  No pilonidal disease.      Musculoskeletal: Normal range of motion. She exhibits no tenderness.  Lymphadenopathy:       Right: No inguinal adenopathy present.       Left: No inguinal adenopathy present.  Neurological: She is alert and oriented to person, place, and time. No cranial nerve deficit. She  exhibits normal muscle tone. Coordination normal.  Skin: Skin is warm and dry. No rash noted. She is not diaphoretic.  Psychiatric: She has a normal mood and affect. Her behavior is normal.       Assessment:     T2 rectal cancer with negative margins status post partial proctectomy. Complicated by abscess improved with diversion. Drain out & antibiotics off.    Plan:     Overall then she didn't okay.  I stressed oral nutrition for her. I see no evidence of any obvious open sores. I suspect that her appetite improved with time. However, I know of no 29+-year-old that has a great appetite anyway.  Continue fecal diversion with the loop colostomy.  See radiation and medical oncology to consider post adjuvant therapy. Once that has been completed, then plan colostomy takedown.  I would plan  to get an enema preoperatively to make sure that there there's no modalities.  Return to clinic in 2 weeks to make sure she is continuing to improve.

## 2010-12-09 ENCOUNTER — Telehealth: Payer: Self-pay | Admitting: Oncology

## 2010-12-14 ENCOUNTER — Encounter: Payer: Self-pay | Admitting: *Deleted

## 2010-12-15 ENCOUNTER — Ambulatory Visit (INDEPENDENT_AMBULATORY_CARE_PROVIDER_SITE_OTHER): Payer: Medicare Other | Admitting: Cardiovascular Disease

## 2010-12-15 ENCOUNTER — Encounter: Payer: Self-pay | Admitting: Cardiovascular Disease

## 2010-12-15 VITALS — BP 151/65 | HR 80 | Ht 64.0 in | Wt 104.0 lb

## 2010-12-15 DIAGNOSIS — I2510 Atherosclerotic heart disease of native coronary artery without angina pectoris: Secondary | ICD-10-CM

## 2010-12-15 DIAGNOSIS — I10 Essential (primary) hypertension: Secondary | ICD-10-CM

## 2010-12-15 NOTE — Patient Instructions (Signed)
Your physician wants you to follow-up in:  6 months. You will receive a reminder letter in the mail two months in advance. If you don't receive a letter, please call our office to schedule the follow-up appointment.   

## 2010-12-15 NOTE — Assessment & Plan Note (Addendum)
Stable. No changes. Continue ASA/Plavix/statin/beta blocker. She will need to have her lipids checked and LFTs. Her family is asking for this to be done at her Nursing Home. I will write this in my recs. If it can't be done there, they will call and we can do it her.

## 2010-12-15 NOTE — Assessment & Plan Note (Signed)
Her BP is elevated but given her age, I do not want to add extra meds. I do not want to lower her BP too much. No changes.

## 2010-12-15 NOTE — Progress Notes (Signed)
History of Present Illness:77 y.o. female with history of CAD, PAF, rectal cancer who is here today for cardiac follow up.  She was admitted 7/13-7/17 with an inferior STEMI. A bare metal stent was placed in the totally occluded RCA. She also had a high grade lesion in the proximal Circumflex and had staged intervention with a bare metal stent placed in the proximal Circumflex several days later. Echo 7/14: EF 60-65%, mild RAE, mild to moderate AI, mild MR, moderate TR, RVE, PASP 47. She was diagnosed with rectal cancer and had recent partial proctectomy with loop colostomy November 17, 2010(Dr. Karie Soda). This was complicated by an abscess but this has improved with drainage on 11/22/10 and antibiotics.  Her Plavix was held during her operative period.    She tells me that she is doing well. The patient denies chest pain, shortness of breath, syncope, orthopnea, PND or significant pedal edema.    Past Medical History  Diagnosis Date  . Arthritis   . Irregular heart beat   . Hyperlipidemia   . CAD (coronary artery disease)     a. s/p INF STEMI 7/12: tx with BMS to RCA;  b. cath 08/26/10: pLAD 30%, mLAD 50%, D1 40%, pCFX 95%, mRCA occluded;   c. staged PCI of pCFX with BMS;   d. echo 7/12:   EF 60-65%, mild RAE, mild to moderate AI, mild MR, moderate TR, RVE, PASP 47  . Hearing loss   . Bilateral external ear infections   . Rectal cancer     rectal  . Rectal Cancer 08/2010    adenocarcinoma  . Myocardial infarction 08/2010  . Hypertension     Past Surgical History  Procedure Date  . Abdominal hysterectomy   . Tonsillectomy   . Total hip arthroplasty   . Knee surgery   . External ear surgery   . Wrist surgery   . Cardiac surgery     2 stints put in  . Partial protectomy 11/17/2010    with colostomy 10/9    Current Outpatient Prescriptions  Medication Sig Dispense Refill  . acetaminophen (TYLENOL) 325 MG tablet Take 650 mg by mouth every 6 (six) hours as needed.        Marland Kitchen aspirin 81  MG tablet Take 81 mg by mouth daily.        Marland Kitchen Clopidogrel Bisulfate (PLAVIX PO) 75 mg Daily.      . CRESTOR 40 MG tablet Daily.      Marland Kitchen FLUoxetine (PROZAC) 20 MG tablet Take 20 mg by mouth daily.        . metoprolol tartrate (LOPRESSOR) 25 MG tablet 1 tab three times a day      . Multiple Vitamin (MULTIVITAMIN) tablet Take 1 tablet by mouth daily.        Marland Kitchen NITROSTAT 0.4 MG SL tablet Ad lib.      . NON FORMULARY Magic mouth wash       . potassium chloride (KLOR-CON) 10 MEQ CR tablet Take 10 mEq by mouth daily.        . Psyllium (METAMUCIL PO) Take by mouth daily.        . traMADol (ULTRAM) 50 MG tablet Take 50 mg by mouth every 6 (six) hours as needed. Maximum dose= 8 tablets per day         Allergies  Allergen Reactions  . Effexor (Venlafaxine Hydrochloride)   . Sulfa Antibiotics Nausea Only  . Hydrocodone Nausea Only    History   Social  History  . Marital Status: Widowed    Spouse Name: N/A    Number of Children: N/A  . Years of Education: N/A   Occupational History  . Not on file.   Social History Main Topics  . Smoking status: Never Smoker   . Smokeless tobacco: Never Used  . Alcohol Use: No  . Drug Use: No  . Sexually Active:    Other Topics Concern  . Not on file   Social History Narrative  . No narrative on file    Family History  Problem Relation Age of Onset  . Kidney disease Mother   . Heart disease Father   . Cancer Brother     Review of Systems:  As stated in the HPI and otherwise negative.   BP 151/65  Pulse 80  Ht 5\' 4"  (1.626 m)  Wt 104 lb (47.174 kg)  BMI 17.85 kg/m2  Physical Examination: General: Well developed, well nourished, NAD HEENT: OP clear, mucus membranes moist SKIN: warm, dry. No rashes. Neuro: No focal deficits Musculoskeletal: Muscle strength 5/5 all ext Psychiatric: Mood and affect normal Neck: No JVD, no carotid bruits, no thyromegaly, no lymphadenopathy. Lungs:Clear bilaterally, no wheezes, rhonci,  crackles Cardiovascular: Regular rate and rhythm. No murmurs, gallops or rubs. Abdomen:Soft. Bowel sounds present. Non-tender.  Extremities: No lower extremity edema. Pulses are 2 + in the bilateral DP/PT.

## 2010-12-19 ENCOUNTER — Telehealth (INDEPENDENT_AMBULATORY_CARE_PROVIDER_SITE_OTHER): Payer: Self-pay

## 2010-12-19 ENCOUNTER — Telehealth (INDEPENDENT_AMBULATORY_CARE_PROVIDER_SITE_OTHER): Payer: Self-pay | Admitting: Surgery

## 2010-12-19 NOTE — Telephone Encounter (Signed)
I received a call from her PCP (Dr. Jeannetta Nap) concerning pus draining from vagina and decreased appetite.  No severe abd pain/fever.  A/P: ?RV fisula?  Already diverted  Rec:  -start PO Abx (Augmentin) -culture drainage -see me 11/12 as scheduled -see [facility] sooner if worse -CT scan of abd/pelvis if worse.  Dr. Jeannetta Nap agreed

## 2010-12-19 NOTE — Telephone Encounter (Signed)
Spoke to [employer] at [employer] who did get the orders started on the pt to get Augmentin and she will make sure the pt keeps her appt with Dr Michaell Cowing for 12-26-10/ [facility]

## 2010-12-22 ENCOUNTER — Telehealth: Payer: Self-pay | Admitting: Oncology

## 2010-12-22 ENCOUNTER — Telehealth: Payer: Self-pay | Admitting: *Deleted

## 2010-12-22 NOTE — Telephone Encounter (Signed)
Spoke with staff nurse regarding cancellation of appointment tomorrow. Patient had surgery on 11/22/10--has colostomy now. She does not know reason for cancellation. Will have her nurse call office tomorrow.

## 2010-12-22 NOTE — Telephone Encounter (Signed)
Darl Pikes called for [place] to cancel the pt's appt for  12/23/2010

## 2010-12-23 ENCOUNTER — Ambulatory Visit: Payer: Medicare Other | Admitting: Oncology

## 2010-12-26 ENCOUNTER — Ambulatory Visit (INDEPENDENT_AMBULATORY_CARE_PROVIDER_SITE_OTHER): Payer: Medicare Other | Admitting: Surgery

## 2010-12-26 ENCOUNTER — Encounter (INDEPENDENT_AMBULATORY_CARE_PROVIDER_SITE_OTHER): Payer: Self-pay | Admitting: Surgery

## 2010-12-26 VITALS — BP 100/60 | HR 80 | Temp 96.8°F | Resp 16 | Ht 64.5 in | Wt 101.2 lb

## 2010-12-26 DIAGNOSIS — C20 Malignant neoplasm of rectum: Secondary | ICD-10-CM

## 2010-12-26 MED ORDER — CIPROFLOXACIN HCL 500 MG PO TABS: 500.0000 mg | ORAL_TABLET | Freq: Two times a day (BID) | ORAL | Status: AC

## 2010-12-26 MED ORDER — FLUCONAZOLE 200 MG PO TABS: 200.0000 mg | ORAL_TABLET | Freq: Every day | ORAL | Status: DC

## 2010-12-26 MED ORDER — AMOXICILLIN-POT CLAVULANATE 875-125 MG PO TABS: 1.0000 | ORAL_TABLET | Freq: Two times a day (BID) | ORAL | Status: DC

## 2010-12-26 NOTE — Progress Notes (Signed)
Subjective:     Patient ID: [name], female   DOB: October 05, 1916, 77 y.o.   MRN: [id number]  HPI  Patient Care Team: Kaleen Mask as PCP - General (Family Medicine) Verne Carrow, MD as Consulting Physician (Cardiology) Graylin Shiver as Consulting Physician (Gastroenterology) Maryln Gottron, MD as Consulting Physician (Radiation Oncology) Lucile Shutters, MD as Consulting Physician (Internal Medicine)  This patient is a 77 y.o.female who presents today for surgical evaluation.   Procedure: Partial proctectomy by TEM for T2 cancer  Patient comes back today feeling tired. She is walking better. Her mouth is lessor. Rapid type is still poor. She had one episode of nausea vomiting. Her appetite is a little better now but frustratingly poor. She does not take the supplement shakes as much but she's thinking of restarting.  She just feels worn out. She started getting some vaginal drainage last week. Dr. Jeannetta Nap called me about it. We started her on Augmentin. She notes the drainage has gone down. However it's not totally resolved.  Past Medical History  Diagnosis Date  . Arthritis   . Irregular heart beat   . Hyperlipidemia   . CAD (coronary artery disease)     a. s/p INF STEMI 7/12: tx with BMS to RCA;  b. cath 08/26/10: pLAD 30%, mLAD 50%, D1 40%, pCFX 95%, mRCA occluded;   c. staged PCI of pCFX with BMS;   d. echo 7/12:   EF 60-65%, mild RAE, mild to moderate AI, mild MR, moderate TR, RVE, PASP 47  . Hearing loss   . Bilateral external ear infections   . Rectal cancer     rectal  . Rectal Cancer 08/2010    adenocarcinoma  . Myocardial infarction 08/2010  . Hypertension   . Vaginal infection November 2012    pt on antibiotics because infection really causing her to be fatigued     Past Surgical History  Procedure Date  . Abdominal hysterectomy   . Tonsillectomy   . Total hip arthroplasty   . Knee surgery   . External ear surgery   . Wrist surgery   .  Cardiac surgery     2 stints put in  . Partial protectomy 11/17/2010    with colostomy 10/9    History   Social History  . Marital Status: Widowed    Spouse Name: N/A    Number of Children: N/A  . Years of Education: N/A   Occupational History  . Not on file.   Social History Main Topics  . Smoking status: Never Smoker   . Smokeless tobacco: Never Used  . Alcohol Use: No  . Drug Use: No  . Sexually Active:    Other Topics Concern  . Not on file   Social History Narrative  . No narrative on file    Family History  Problem Relation Age of Onset  . Kidney disease Mother   . Heart disease Father   . Cancer Brother     Current outpatient prescriptions:acetaminophen (TYLENOL) 325 MG tablet, Take 650 mg by mouth every 6 (six) hours as needed.  , Disp: , Rfl: ;  amoxicillin-clavulanate (AUGMENTIN) 875-125 MG per tablet, Take 1 tablet by mouth 2 (two) times daily., Disp: 20 tablet, Rfl: 2;  aspirin 81 MG tablet, Take 81 mg by mouth daily.  , Disp: , Rfl: ;  Clopidogrel Bisulfate (PLAVIX PO), 75 mg Daily., Disp: , Rfl: ;  CRESTOR 40 MG tablet, Daily., Disp: , Rfl:  FLUoxetine (PROZAC) 20 MG tablet, Take 20 mg by mouth daily.  , Disp: , Rfl: ;  metoprolol tartrate (LOPRESSOR) 25 MG tablet, 1 tab three times a day, Disp: , Rfl: ;  Multiple Vitamin (MULTIVITAMIN) tablet, Take 1 tablet by mouth daily.  , Disp: , Rfl: ;  NITROSTAT 0.4 MG SL tablet, Ad lib., Disp: , Rfl: ;  NON FORMULARY, Magic mouth wash , Disp: , Rfl: ;  potassium chloride (KLOR-CON) 10 MEQ CR tablet, Take 10 mEq by mouth daily.  , Disp: , Rfl:  Psyllium (METAMUCIL PO), Take by mouth daily.  , Disp: , Rfl: ;  traMADol (ULTRAM) 50 MG tablet, Take 50 mg by mouth every 6 (six) hours as needed. Maximum dose= 8 tablets per day , Disp: , Rfl: ;  fluconazole (DIFLUCAN) 200 MG tablet, Take 1 tablet (200 mg total) by mouth daily., Disp: 3 tablet, Rfl: 2  Allergies  Allergen Reactions  . Effexor (Venlafaxine Hydrochloride)   .  Sulfa Antibiotics Nausea Only  . Hydrocodone Nausea Only       Review of Systems  Constitutional: Positive for appetite change and fatigue. Negative for fever, chills and diaphoresis.  HENT: Negative for ear pain, sore throat and trouble swallowing.   Eyes: Negative for photophobia and visual disturbance.  Respiratory: Negative for cough and choking.   Cardiovascular: Negative for chest pain and palpitations.  Gastrointestinal: Negative for nausea, vomiting, abdominal pain, diarrhea, constipation, blood in stool, abdominal distention, anal bleeding and rectal pain.  Genitourinary: Positive for vaginal discharge and vaginal pain. Negative for dysuria, frequency, vaginal bleeding and difficulty urinating.  Musculoskeletal: Negative for myalgias and gait problem.  Skin: Negative for color change, pallor and rash.  Neurological: Negative for dizziness, speech difficulty, weakness and numbness.  Hematological: Negative for adenopathy.  Psychiatric/Behavioral: Negative for confusion and agitation. The patient is not nervous/anxious.        Objective:   Physical Exam  Constitutional: She is oriented to person, place, and time. She appears well-developed and well-nourished. No distress.  HENT:  Head: Normocephalic.  Mouth/Throat: Oropharynx is clear and moist. No oropharyngeal exudate.  Eyes: Conjunctivae and EOM are normal. Pupils are equal, round, and reactive to light. No scleral icterus.  Neck: Normal range of motion. No tracheal deviation present.  Cardiovascular: Normal rate and intact distal pulses.   Pulmonary/Chest: Effort normal. No respiratory distress. She exhibits no tenderness.  Abdominal: Soft. She exhibits no distension. There is no tenderness. Hernia confirmed negative in the right inguinal area and confirmed negative in the left inguinal area.       Incisions clean with normal healing ridges.  No hernias  Genitourinary: Vaginal discharge found.       Perianal skin clean  with good hygiene.  No pruritis.  No pilonidal disease.  No fissure.  No abscess/fistula.    Tolerates digital and anoscopic rectal exam.  Normal sphincter tone.    Anterior rectal wound w some separation mild.  Yellow scant vaginal drainage.  Some ulceration in proximal posterior wall.  I pulled a Ligaclip out of vagina  Musculoskeletal: Normal range of motion. She exhibits no tenderness.  Lymphadenopathy:       Right: No inguinal adenopathy present.       Left: No inguinal adenopathy present.  Neurological: She is alert and oriented to person, place, and time. No cranial nerve deficit. She exhibits normal muscle tone. Coordination normal.  Skin: Skin is warm and dry. No rash noted. She is not diaphoretic.  Psychiatric:  She has a normal mood and affect. Her behavior is normal. Judgment and thought content normal.       Very alert & sharp       Assessment:     Rectal abscess after TEM draining into vagina, improving clinically    Plan:     -renew Augmentin -add Cipro w Pseudomonas in prior abscess -add fluconazole x 3 days for possible yeast component Continue fecal diversion -restart oral supplemental shakes BID minimum to QID -continue PT/exercise.  I tried to stress to her that mentally she is more sharp, oral soreness is decreased, the drainage has decreased, pain has gone down, she is better exercise tolerance.  I am not surprised that she is not back to normal energy level nor in appetite.  I noted I'm sorry that she is having problems with the drainage, however I am hopeful that it should get better over time. If she does not improve then I would get a CAT scan to make sure there was not a another fluid collection that is percolating that is keeping this from resolving. Otherwise, I will often a more aggressive examination her intervention until she completes antibiotics. She and her friend expressed understanding and appreciation.

## 2010-12-26 NOTE — Patient Instructions (Signed)

## 2011-01-02 ENCOUNTER — Telehealth (INDEPENDENT_AMBULATORY_CARE_PROVIDER_SITE_OTHER): Payer: Self-pay

## 2011-01-02 NOTE — Telephone Encounter (Signed)
Called pt back after [name] brought a note around to Dr Michaell Cowing and I that the pt was still having drainage after being on antibiotics. 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 I advised the pt that she is going to have some drainage but if something changes she is to call us right away. The pt understands./ [facility]

## 2011-01-04 ENCOUNTER — Telehealth (INDEPENDENT_AMBULATORY_CARE_PROVIDER_SITE_OTHER): Payer: Self-pay

## 2011-01-04 ENCOUNTER — Other Ambulatory Visit (INDEPENDENT_AMBULATORY_CARE_PROVIDER_SITE_OTHER): Payer: Self-pay | Admitting: Family Medicine

## 2011-01-04 NOTE — Telephone Encounter (Signed)
Pt called stating she is still having vaginal drainage. No fever. No abd pain. I reviewed 11-19 note from Dr Michaell Cowing. Pt had cooked apples and cream of wheat this morning and tolerated this well. Pt to encourage fluids and healthy diet. Pt to call if she has fever,abd pain for vomiting. Pt has appt next week with Dr Michaell Cowing. Pt encouraged to keep this appt unless something changes.

## 2011-01-11 ENCOUNTER — Encounter (INDEPENDENT_AMBULATORY_CARE_PROVIDER_SITE_OTHER): Payer: Self-pay | Admitting: Surgery

## 2011-01-11 ENCOUNTER — Ambulatory Visit (INDEPENDENT_AMBULATORY_CARE_PROVIDER_SITE_OTHER): Payer: Medicare Other | Admitting: Surgery

## 2011-01-11 VITALS — BP 116/60 | HR 60 | Temp 97.8°F | Resp 16 | Ht 64.5 in | Wt 107.2 lb

## 2011-01-11 DIAGNOSIS — N823 Fistula of vagina to large intestine: Secondary | ICD-10-CM | POA: Insufficient documentation

## 2011-01-11 DIAGNOSIS — N824 Other female intestinal-genital tract fistulae: Secondary | ICD-10-CM

## 2011-01-11 DIAGNOSIS — C20 Malignant neoplasm of rectum: Secondary | ICD-10-CM

## 2011-01-11 HISTORY — DX: Fistula of vagina to large intestine: N82.3

## 2011-01-11 NOTE — Progress Notes (Signed)
Subjective:     Patient ID: [name], female   DOB: 1916/02/27, 77 y.o.   MRN: [id number]  HPI   Patient Care Team: Kaleen Mask as PCP - General (Family Medicine) Verne Carrow, MD as Consulting Physician (Cardiology) Graylin Shiver as Consulting Physician (Gastroenterology) Maryln Gottron, MD as Consulting Physician (Radiation Oncology) Lucile Shutters, MD as Consulting Physician (Internal Medicine)  This patient is a 77 y.o.female who presents today for surgical evaluation.   Procedure: Partial proctectomy by TEM for T2NX cancer  Patient comes back today feeling stronger. She is walking well.   Appetite good.  Energy level coming back up.  No fevers, chills, or sweats. No nausea or vomiting. One loose bowel movement in the bag daily. She called out and actually diarrhea but contrast it was not that watery stool looser than usual. She stopped taking Metamucil. Her eating a high-fiber diet. Denies any pain.  She does note that she is voiding about 2 or 3 times at night. She usually did 1 2 times at night. No burning. No urgency. She empties normally. This has been going on for several weeks. No change off antibiotics.  She notes the vaginal drainage is now intermittent and lower volume.  She completed the oral antibiotics 2 days ago.  She again comes in today with a friend.    Past Medical History  Diagnosis Date  . Arthritis   . Irregular heart beat   . Hyperlipidemia   . CAD (coronary artery disease)     a. s/p INF STEMI 7/12: tx with BMS to RCA;  b. cath 08/26/10: pLAD 30%, mLAD 50%, D1 40%, pCFX 95%, mRCA occluded;   c. staged PCI of pCFX with BMS;   d. echo 7/12:   EF 60-65%, mild RAE, mild to moderate AI, mild MR, moderate TR, RVE, PASP 47  . Hearing loss   . Bilateral external ear infections   . Rectal cancer     rectal  . Rectal Cancer 08/2010    adenocarcinoma  . Myocardial infarction 08/2010  . Hypertension   . Vaginal infection November 2012      pt on antibiotics because infection really causing her to be fatigued     Past Surgical History  Procedure Date  . Abdominal hysterectomy   . Tonsillectomy   . Total hip arthroplasty   . Knee surgery   . External ear surgery   . Wrist surgery   . Cardiac surgery     2 stints put in  . Partial protectomy 11/17/2010    with colostomy 10/9    History   Social History  . Marital Status: Widowed    Spouse Name: N/A    Number of Children: N/A  . Years of Education: N/A   Occupational History  . Not on file.   Social History Main Topics  . Smoking status: Never Smoker   . Smokeless tobacco: Never Used  . Alcohol Use: No  . Drug Use: No  . Sexually Active:    Other Topics Concern  . Not on file   Social History Narrative  . No narrative on file    Family History  Problem Relation Age of Onset  . Kidney disease Mother   . Heart disease Father   . Cancer Brother     Current outpatient prescriptions:acetaminophen (TYLENOL) 325 MG tablet, Take 650 mg by mouth every 6 (six) hours as needed.  , Disp: , Rfl: ;  aspirin 81 MG  tablet, Take 81 mg by mouth daily.  , Disp: , Rfl: ;  Clopidogrel Bisulfate (PLAVIX PO), 75 mg Daily., Disp: , Rfl: ;  CRESTOR 40 MG tablet, Daily., Disp: , Rfl: ;  FLUoxetine (PROZAC) 20 MG tablet, Take 20 mg by mouth daily.  , Disp: , Rfl:  metoprolol tartrate (LOPRESSOR) 25 MG tablet, 1 tab three times a day, Disp: , Rfl: ;  Multiple Vitamin (MULTIVITAMIN) tablet, Take 1 tablet by mouth daily.  , Disp: , Rfl: ;  NITROSTAT 0.4 MG SL tablet, Ad lib., Disp: , Rfl: ;  potassium chloride (KLOR-CON) 10 MEQ CR tablet, Take 10 mEq by mouth daily.  , Disp: , Rfl:   Allergies  Allergen Reactions  . Effexor (Venlafaxine Hydrochloride)   . Sulfa Antibiotics Nausea Only  . Hydrocodone Nausea Only       Review of Systems  Constitutional: Negative for fever, chills, diaphoresis, appetite change and fatigue.  HENT: Negative for ear pain, sore throat and  trouble swallowing.   Eyes: Negative for photophobia and visual disturbance.  Respiratory: Negative for cough and choking.   Cardiovascular: Negative for chest pain and palpitations.  Gastrointestinal: Negative for nausea, vomiting, abdominal pain, diarrhea, constipation, blood in stool, abdominal distention, anal bleeding and rectal pain.  Genitourinary: Positive for frequency and vaginal discharge. Negative for dysuria, urgency, hematuria, flank pain, decreased urine volume, vaginal bleeding, enuresis, difficulty urinating, genital sores and vaginal pain.  Musculoskeletal: Negative for myalgias and gait problem.  Skin: Negative for color change, pallor and rash.  Neurological: Negative for dizziness, speech difficulty, weakness and numbness.  Hematological: Negative for adenopathy.  Psychiatric/Behavioral: Negative for confusion and agitation. The patient is not nervous/anxious.    BP 116/60  Pulse 60  Temp(Src) 97.8 F (36.6 C) (Temporal)  Resp 16  Ht 5' 4.5" (1.638 m)  Wt 107 lb 4 oz (48.648 kg)  BMI 18.13 kg/m2 .    Objective:   Physical Exam  Constitutional: She is oriented to person, place, and time. She appears well-developed and well-nourished. No distress.  HENT:  Head: Normocephalic.  Mouth/Throat: Oropharynx is clear and moist. No oropharyngeal exudate.  Eyes: Conjunctivae and EOM are normal. Pupils are equal, round, and reactive to light. No scleral icterus.  Neck: Normal range of motion. No tracheal deviation present.  Cardiovascular: Normal rate and intact distal pulses.   Pulmonary/Chest: Effort normal. No respiratory distress. She exhibits no tenderness.  Abdominal: Soft. She exhibits no distension. There is no tenderness. Hernia confirmed negative in the right inguinal area and confirmed negative in the left inguinal area.       Incisions clean with normal healing ridges.  No hernias.  Colostomy in place  Genitourinary: Vaginal discharge found.  Musculoskeletal:  Normal range of motion. She exhibits no tenderness.  Lymphadenopathy:       Right: No inguinal adenopathy present.       Left: No inguinal adenopathy present.  Neurological: She is alert and oriented to person, place, and time. No cranial nerve deficit. She exhibits normal muscle tone. Coordination normal.  Skin: Skin is warm and dry. No rash noted. She is not diaphoretic.  Psychiatric: She has a normal mood and affect. Her behavior is normal. Judgment and thought content normal.       Very alert & sharp       Assessment:     Rectal abscess after TEM draining into vagina, improving clinically    Plan:     -follow off antibiotics -Continue fecal diversion -continue  PT/exercise.  I tried to stress to her that mentally she is more sharp, oral soreness is decreased, the drainage has decreased, pain has gone down, she is better exercise tolerance.  I am hopeful she will fully recover with her weight back to normal and returning to normal energy level / appetite.  When drainage has stopped, get and enema to document fistula closure & then colostomy takedown.  The anatomy & physiology of the digestive tract was discussed.  The pathophysiology was discussed.  Possibility of remaining with an ostomy permanently was discussed.  I offered ostomy takedown.  Laparoscopic & open techniques were discussed.   Risks such as bleeding, infection, abscess, leak, reoperation, possible re-ostomy, injury to other organs, hernia, heart attack, death, and other risks were discussed.   I noted a good likelihood this will help address the problem.  Goals of post-operative recovery were discussed as well.  We will work to minimize complications.  Questions were answered.  The patient expresses understanding & wishes to proceed with surgery.  I noted I'm sorry that she is having problems with the drainage, however I am hopeful that it should continue to get better over time. If she does not improve then I would  get a CAT scan to make sure there was not a another fluid collection that is percolating that is keeping this from resolving.  I think that is less likely now. She and her friend expressed understanding and appreciation.

## 2011-02-01 ENCOUNTER — Ambulatory Visit (INDEPENDENT_AMBULATORY_CARE_PROVIDER_SITE_OTHER): Payer: Medicare Other | Admitting: Surgery

## 2011-02-01 ENCOUNTER — Encounter (INDEPENDENT_AMBULATORY_CARE_PROVIDER_SITE_OTHER): Payer: Self-pay | Admitting: Surgery

## 2011-02-01 DIAGNOSIS — N823 Fistula of vagina to large intestine: Secondary | ICD-10-CM

## 2011-02-01 DIAGNOSIS — C20 Malignant neoplasm of rectum: Secondary | ICD-10-CM

## 2011-02-01 DIAGNOSIS — N824 Other female intestinal-genital tract fistulae: Secondary | ICD-10-CM

## 2011-02-01 NOTE — Patient Instructions (Signed)
Exercise one hour a day (walking, bike, swimming, weights, etc)

## 2011-02-01 NOTE — Progress Notes (Signed)
Subjective:     Patient ID: [name], female   DOB: February 03, 1917, 77 y.o.   MRN: [id number]  HPI   Patient Care Team: Kaleen Mask as PCP - General (Family Medicine) Verne Carrow, MD as Consulting Physician (Cardiology) Graylin Shiver as Consulting Physician (Gastroenterology) Maryln Gottron, MD as Consulting Physician (Radiation Oncology) Lucile Shutters, MD as Consulting Physician (Internal Medicine)  This patient is a 77 y.o.female who presents today for surgical evaluation.   Procedure: Partial proctectomy by TEM for T2NX cancer  Patient comes back today feeling stronger. She is walking well.   Appetite good.  Energy level coming back up.  No fevers, chills, or sweats. No nausea or vomiting. One loose bowel movement in the bag daily. She called out and actually diarrhea but contrast it was not that watery stool looser than usual. She stopped taking Metamucil. Eating a diet. Denies any pain.  She does note that she is voiding normally.  She notes the vaginal drainage is less with lower volume.  Now only noticeable in AM after walking around.  She is still off antibiotics.  She again comes in today with a friend.    Past Medical History  Diagnosis Date  . Arthritis   . Irregular heart beat   . Hyperlipidemia   . CAD (coronary artery disease)     a. s/p INF STEMI 7/12: tx with BMS to RCA;  b. cath 08/26/10: pLAD 30%, mLAD 50%, D1 40%, pCFX 95%, mRCA occluded;   c. staged PCI of pCFX with BMS;   d. echo 7/12:   EF 60-65%, mild RAE, mild to moderate AI, mild MR, moderate TR, RVE, PASP 47  . Hearing loss   . Bilateral external ear infections   . Rectal cancer     rectal  . Rectal Cancer 08/2010    adenocarcinoma  . Myocardial infarction 08/2010  . Hypertension   . Vaginal infection November 2012    pt on antibiotics because infection really causing her to be fatigued   . 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 01/11/2011    Past Surgical  History  Procedure Date  . Abdominal hysterectomy   . Tonsillectomy   . Total hip arthroplasty   . Knee surgery   . External ear surgery   . Wrist surgery   . Cardiac surgery     2 stints put in  . Partial protectomy 11/17/2010    with colostomy 10/9    History   Social History  . Marital Status: Widowed    Spouse Name: N/A    Number of Children: N/A  . Years of Education: N/A   Occupational History  . Not on file.   Social History Main Topics  . Smoking status: Never Smoker   . Smokeless tobacco: Never Used  . Alcohol Use: No  . Drug Use: No  . Sexually Active:    Other Topics Concern  . Not on file   Social History Narrative  . No narrative on file    Family History  Problem Relation Age of Onset  . Kidney disease Mother   . Heart disease Father   . Cancer Brother     Current outpatient prescriptions:acetaminophen (TYLENOL) 325 MG tablet, Take 650 mg by mouth every 6 (six) hours as needed.  , Disp: , Rfl: ;  aspirin 81 MG tablet, Take 81 mg by mouth daily.  , Disp: , Rfl: ;  Clopidogrel Bisulfate (PLAVIX PO), 75 mg  Daily., Disp: , Rfl: ;  CRESTOR 40 MG tablet, Daily., Disp: , Rfl: ;  metoprolol tartrate (LOPRESSOR) 25 MG tablet, 1 tab three times a day, Disp: , Rfl:  Multiple Vitamin (MULTIVITAMIN) tablet, Take 1 tablet by mouth daily.  , Disp: , Rfl: ;  NITROSTAT 0.4 MG SL tablet, Ad lib., Disp: , Rfl: ;  omeprazole (PRILOSEC) 40 MG capsule, , Disp: , Rfl:   Allergies  Allergen Reactions  . Effexor (Venlafaxine Hydrochloride)   . Sulfa Antibiotics Nausea Only  . Hydrocodone Nausea Only       Review of Systems  Constitutional: Negative for fever, chills, diaphoresis, appetite change and fatigue.  HENT: Negative for ear pain, sore throat and trouble swallowing.   Eyes: Negative for photophobia and visual disturbance.  Respiratory: Negative for cough and choking.   Cardiovascular: Negative for chest pain and palpitations.  Gastrointestinal: Negative  for nausea, vomiting, abdominal pain, diarrhea, constipation, blood in stool, abdominal distention, anal bleeding and rectal pain.  Genitourinary: Positive for vaginal discharge. Negative for dysuria, urgency, frequency, hematuria, flank pain, decreased urine volume, vaginal bleeding, enuresis, difficulty urinating, genital sores, vaginal pain and pelvic pain.  Musculoskeletal: Negative for myalgias and gait problem.  Skin: Negative for color change, pallor and rash.  Neurological: Negative for dizziness, speech difficulty, weakness and numbness.  Hematological: Negative for adenopathy.  Psychiatric/Behavioral: Negative for confusion and agitation. The patient is not nervous/anxious.    BP 140/62  Pulse 60  Temp(Src) 97.8 F (36.6 C) (Temporal)  Resp 16  Ht 5' 4.5" (1.638 m)  Wt 110 lb (49.896 kg)  BMI 18.59 kg/m2 .    Objective:   Physical Exam  Constitutional: She is oriented to person, place, and time. She appears well-developed and well-nourished. No distress.  HENT:  Head: Normocephalic.  Mouth/Throat: Oropharynx is clear and moist. No oropharyngeal exudate.  Eyes: Conjunctivae and EOM are normal. Pupils are equal, round, and reactive to light. No scleral icterus.  Neck: Normal range of motion. No tracheal deviation present.  Cardiovascular: Normal rate and intact distal pulses.   Pulmonary/Chest: Effort normal. No respiratory distress. She exhibits no tenderness.  Abdominal: Soft. She exhibits no distension. There is no tenderness. Hernia confirmed negative in the right inguinal area and confirmed negative in the left inguinal area.       Incisions clean with normal healing ridges.  No hernias.  Colostomy in place  Genitourinary: Vaginal discharge found.       Anterior rectal wound felt with granualtion.  Mod stricturing   Vagina with L posterior <1cm ulcer c/w fistula, ~8cm from introitus.    Perianal skin clean with good hygiene.  No pruritis.  No pilonidal disease.  No  fissure.  No abscess/fistula.   Tolerates digital rectal exam.  Normal sphincter tone.    Hemorrhoidal piles WNL   Musculoskeletal: Normal range of motion. She exhibits no tenderness.  Lymphadenopathy:       Right: No inguinal adenopathy present.       Left: No inguinal adenopathy present.  Neurological: She is alert and oriented to person, place, and time. No cranial nerve deficit. She exhibits normal muscle tone. Coordination normal.  Skin: Skin is warm and dry. No rash noted. She is not diaphoretic.  Psychiatric: She has a normal mood and affect. Her behavior is normal. Judgment and thought content normal.       Very alert & sharp       Assessment:     Rectal abscess after TEM draining  into vagina, improving clinically    Plan:     -follow off antibiotics -Continue fecal diversion -continue PT/exercise.  I am hopeful she will fully recover with her weight back to normal and returning to normal energy level / appetite.  When drainage has stopped, get and enema to document fistula closure & then colostomy takedown.  If not stopped or fistula open >59months, may need rectosigmoid resection & RV fistula repair.  May reconsider radiation for local control while she is diverted.  She is hesitant to do this still...  I noted I'm sorry that she is having problems with the drainage, however I am hopeful that it should continue to get better over time. If she does not improve then I would get a CAT scan to make sure there was not a another fluid collection that is percolating that is keeping this from resolving.  I think that is less likely now. She and her friend expressed understanding and appreciation.

## 2011-02-21 ENCOUNTER — Telehealth: Payer: Self-pay | Admitting: *Deleted

## 2011-02-21 NOTE — Telephone Encounter (Signed)
Surgery per Dr. Michaell Cowing for rectal cancer in October 2012 and was told "I got it all". She reports Dr. Michaell Cowing is now suggesting radiation therapy and she is apprehensive about this and wants appointment with Dr. Truett Perna to discuss this. She prefers not to pursue further tx. Will make MD aware of request.

## 2011-02-26 NOTE — Telephone Encounter (Signed)
Ok, schedule her 1/14 at [facility] or week of 1/21

## 2011-03-01 ENCOUNTER — Telehealth: Payer: Self-pay | Admitting: *Deleted

## 2011-03-01 NOTE — Telephone Encounter (Signed)
Patient given appointment for 03/09/11 at 4:15pm per Dr. Truett Perna (prior patient cancelled).

## 2011-03-09 ENCOUNTER — Ambulatory Visit (HOSPITAL_BASED_OUTPATIENT_CLINIC_OR_DEPARTMENT_OTHER): Payer: Medicare Other | Admitting: Nurse Practitioner

## 2011-03-09 VITALS — BP 149/64 | HR 64 | Temp 97.0°F | Ht 64.5 in | Wt 112.2 lb

## 2011-03-09 DIAGNOSIS — C20 Malignant neoplasm of rectum: Secondary | ICD-10-CM

## 2011-03-09 NOTE — Progress Notes (Signed)
OFFICE PROGRESS NOTE   INTERVAL HISTORY:   She transanal excision of the rectal cancer by Dr. Michaell Cowing on 11/17/2010. She developed a postoperative abscess and anastomotic leak requiring repeat surgery on 11/22/2010. A diverting loop colostomy was created.  She reports a one-month recovery in a nursing facility following this procedure. She continues to have a vaginal "discharge ". The discharge is decreasing. Dr. gross plans a colostomy takedown procedure when the fistula has completely closed.  She feels well at present.  Objective:  Vital signs in last 24 hours:  Blood pressure 149/64, pulse 64, temperature 97 F (36.1 C), temperature source Oral, height 5' 4.5" (1.638 m), weight 112 lb 3.2 oz (50.894 kg).    HEENT: Neck without mass Lymphatics: "Shotty "left greater than right axillary nodes. No cervical, supraclavicular, or inguinal nodes Resp: Lungs clear bilaterally Cardio: Regular rate and rhythm GI:  Nontender, no hepatomegaly, left lower quadrant colostomy, no mass Vascular: No leg edema      Medications: I have reviewed the patient's current medications.  Assessment/Plan: 1. Clinical stage I rectal cancer (uT2 uN0), status post a transanal excision procedure on 11/17/2010 with the pathology confirming a p T2 adenocarcinoma with negative surgical margins 2. Post operative anastomotic leak/pelvic abscess, status post repeat surgery with creation of a diverting loop colostomy on 11/22/2010 3. Post operative vaginal fistula 4. Status post myocardial infarction July 2012 with placement of coronary artery stents, maintained on Plavix and aspirin. 5.     History of hypertension   Disposition:  She underwent transanal excision of the clinical stage I rectal cancer in October of 2012. She had a complicated postoperative course.  I discussed adjuvant treatment options with her again today. She understands the risk of local recurrence in the absence of definitive surgery. I  recommend adjuvant radiation and sensitizing 5 fluorouracil chemotherapy. She saw Dr. Roselind Messier prior to surgery and he agreed with the plan for radiation.  She stated that she does not wish to receive chemotherapy or radiation. She stated her age was largely responsible for this decision.  She plans to continue clinical followup with Dr. Renaldo Harrison and Dr. Jeannetta Nap. I did not schedule a followup appointment in the [facility] clinic. We will see her in the future as needed.   Lucile Shutters, MD  03/09/2011  5:56 PM

## 2011-03-13 ENCOUNTER — Telehealth (INDEPENDENT_AMBULATORY_CARE_PROVIDER_SITE_OTHER): Payer: Self-pay

## 2011-03-13 NOTE — Telephone Encounter (Signed)
Notified pt that we faxed the ostomy supplies Rx to [facility] per her request.

## 2011-03-15 ENCOUNTER — Ambulatory Visit (INDEPENDENT_AMBULATORY_CARE_PROVIDER_SITE_OTHER): Payer: Medicare Other | Admitting: Surgery

## 2011-03-15 ENCOUNTER — Encounter (INDEPENDENT_AMBULATORY_CARE_PROVIDER_SITE_OTHER): Payer: Self-pay | Admitting: Surgery

## 2011-03-15 VITALS — BP 108/54 | HR 60 | Temp 96.6°F | Resp 16 | Ht 64.5 in | Wt 109.0 lb

## 2011-03-15 DIAGNOSIS — N824 Other female intestinal-genital tract fistulae: Secondary | ICD-10-CM

## 2011-03-15 DIAGNOSIS — N823 Fistula of vagina to large intestine: Secondary | ICD-10-CM

## 2011-03-15 DIAGNOSIS — C20 Malignant neoplasm of rectum: Secondary | ICD-10-CM

## 2011-03-15 MED ORDER — AMOXICILLIN-POT CLAVULANATE 875-125 MG PO TABS: 1.0000 | ORAL_TABLET | Freq: Two times a day (BID) | ORAL | Status: AC

## 2011-03-15 MED ORDER — FLUCONAZOLE 200 MG PO TABS: 200.0000 mg | ORAL_TABLET | Freq: Every day | ORAL | Status: AC

## 2011-03-15 NOTE — Progress Notes (Signed)
Subjective:     Patient ID: [name], female   DOB: 1916/11/18, 78 y.o.   MRN: [id number]  HPI   Patient Care Team: Kaleen Mask, MD as PCP - General (Family Medicine) Verne Carrow, MD as Consulting Physician (Cardiology) Graylin Shiver, MD as Consulting Physician (Gastroenterology) Lucile Shutters, MD as Consulting Physician (Internal Medicine) Billie Lade, MD as Consulting Physician (Radiation Oncology)  This patient is a 78 y.o.female who presents today for surgical evaluation.   Procedure: Partial proctectomy by TEM for T2NX cancer 04Oct2012 Diverting loop sigmoid colostomy for post-op abscess & rectovaginal fistula Oct2012  Patient comes back today feeling stronger. She is walking well.   Appetite good.  Energy level coming back up.  No pelvic pain/discomfort.  No fevers, chills, or sweats. No nausea or vomiting. 1-2 bowel movement in the bag daily.  She notes the vaginal drainage is persisting.   A little more than before but not high.  Past Medical History  Diagnosis Date  . Arthritis   . Irregular heart beat   . Hyperlipidemia   . CAD (coronary artery disease)     a. s/p INF STEMI 7/12: tx with BMS to RCA;  b. cath 08/26/10: pLAD 30%, mLAD 50%, D1 40%, pCFX 95%, mRCA occluded;   c. staged PCI of pCFX with BMS;   d. echo 7/12:   EF 60-65%, mild RAE, mild to moderate AI, mild MR, moderate TR, RVE, PASP 47  . Hearing loss   . Bilateral external ear infections   . Rectal cancer     rectal  . Rectal Cancer 08/2010    adenocarcinoma  . Myocardial infarction 08/2010  . Hypertension   . Vaginal infection November 2012    pt on antibiotics because infection really causing her to be fatigued   . 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 01/11/2011    Past Surgical History  Procedure Date  . Abdominal hysterectomy   . Tonsillectomy   . Total hip arthroplasty   . Knee surgery   . External ear surgery   . Wrist surgery   . Cardiac  surgery     2 stints put in  . Partial protectomy 11/17/2010    with colostomy 10/9    History   Social History  . Marital Status: Widowed    Spouse Name: N/A    Number of Children: N/A  . Years of Education: N/A   Occupational History  . Not on file.   Social History Main Topics  . Smoking status: Never Smoker   . Smokeless tobacco: Never Used  . Alcohol Use: No  . Drug Use: No  . Sexually Active: Not on file   Other Topics Concern  . Not on file   Social History Narrative  . No narrative on file    Family History  Problem Relation Age of Onset  . Kidney disease Mother   . Heart disease Father   . Cancer Brother     Current outpatient prescriptions:omeprazole (PRILOSEC) 40 MG capsule, Take 40 mg by mouth as needed., Disp: , Rfl: ;  acetaminophen (TYLENOL) 325 MG tablet, Take 650 mg by mouth every 6 (six) hours as needed.  , Disp: , Rfl: ;  amoxicillin-clavulanate (AUGMENTIN) 875-125 MG per tablet, Take 1 tablet by mouth 2 (two) times daily., Disp: 20 tablet, Rfl: 2;  aspirin 81 MG tablet, Take 81 mg by mouth daily.  , Disp: , Rfl:  Clopidogrel Bisulfate (PLAVIX PO), 75  mg Daily., Disp: , Rfl: ;  CRESTOR 40 MG tablet, Daily., Disp: , Rfl: ;  fluconazole (DIFLUCAN) 200 MG tablet, Take 1 tablet (200 mg total) by mouth daily., Disp: 3 tablet, Rfl: 2;  metoprolol tartrate (LOPRESSOR) 25 MG tablet, 1 tab three times a day, Disp: , Rfl: ;  Multiple Vitamin (MULTIVITAMIN) tablet, Take 1 tablet by mouth daily.  , Disp: , Rfl: ;  NITROSTAT 0.4 MG SL tablet, Ad lib., Disp: , Rfl:   Allergies  Allergen Reactions  . Effexor (Venlafaxine Hydrochloride)   . Sulfa Antibiotics Nausea Only  . Hydrocodone Nausea Only       Review of Systems  Constitutional: Negative for fever, chills, diaphoresis, appetite change and fatigue.  HENT: Negative for ear pain, sore throat and trouble swallowing.   Eyes: Negative for photophobia and visual disturbance.  Respiratory: Negative for cough  and choking.   Cardiovascular: Negative for chest pain and palpitations.  Gastrointestinal: Negative for nausea, vomiting, abdominal pain, diarrhea, constipation, blood in stool, abdominal distention, anal bleeding and rectal pain.  Genitourinary: Positive for vaginal discharge. Negative for dysuria, urgency, frequency, hematuria, flank pain, decreased urine volume, vaginal bleeding, enuresis, difficulty urinating, genital sores, vaginal pain and pelvic pain.  Musculoskeletal: Negative for myalgias and gait problem.  Skin: Negative for color change, pallor and rash.  Neurological: Negative for dizziness, speech difficulty, weakness and numbness.  Hematological: Negative for adenopathy.  Psychiatric/Behavioral: Negative for confusion and agitation. The patient is not nervous/anxious.    BP 108/54  Pulse 60  Temp(Src) 96.6 F (35.9 C) (Temporal)  Resp 16  Ht 5' 4.5" (1.638 m)  Wt 109 lb (49.442 kg)  BMI 18.42 kg/m2 .    Objective:   Physical Exam  Constitutional: She is oriented to person, place, and time. She appears well-developed and well-nourished. No distress.  HENT:  Head: Normocephalic.  Mouth/Throat: Oropharynx is clear and moist. No oropharyngeal exudate.  Eyes: Conjunctivae and EOM are normal. Pupils are equal, round, and reactive to light. No scleral icterus.  Neck: Normal range of motion. No tracheal deviation present.  Cardiovascular: Normal rate and intact distal pulses.   Pulmonary/Chest: Effort normal. No respiratory distress. She exhibits no tenderness.  Abdominal: Soft. She exhibits no distension. There is no tenderness. Hernia confirmed negative in the right inguinal area and confirmed negative in the left inguinal area.       Incisions clean with normal healing ridges.  No hernias.  Colostomy in place  Genitourinary: Vaginal discharge found.       Vagina with L posterior proximal ulcer c/w fistula, ~8cm from introitus.    Perianal skin clean with good hygiene.  No  pruritis.  No pilonidal disease.  No fissure.  No abscess/fistula.    Tolerates digital rectal exam.  Normal sphincter tone.    Hemorrhoidal piles WNL.  Anterior rectal wound felt with granualtion.  Mod stricturing 2cm      Musculoskeletal: Normal range of motion. She exhibits no tenderness.  Lymphadenopathy:       Right: No inguinal adenopathy present.       Left: No inguinal adenopathy present.  Neurological: She is alert and oriented to person, place, and time. No cranial nerve deficit. She exhibits normal muscle tone. Coordination normal.  Skin: Skin is warm and dry. No rash noted. She is not diaphoretic.  Psychiatric: She has a normal mood and affect. Her behavior is normal. Judgment and thought content normal.       Very alert & sharp  Assessment:     Rectal abscess after TEM draining into vagina with rectovaginal fistula, still open 3 months     Plan:     -restart Abx x10d -Continue fecal diversion -continue PT/exercise.  I am hopeful she will fully recover with her weight back to normal and returning to normal energy level / appetite.    If the drainage has stops, get and enema to document fistula closure & then colostomy takedown.    If not stopped or fistula open >81months:   1.  Consider LAR rectosigmoid resection & RV fistula repair with proximal diversion & then ostomy takedown..  May reconsider radiation for local control while she is diverted.  She is hesitant to do this still...   2.  Keep colostomy indefinitely  She does not like either option.  I see no others that are going to work.  With the T2 lesion, her risk of local recurrence (esp w refusal of rad & chemoTx, is significant.  LAR would help reduce that.  But it is a bigger operation.  D/w Drs. Sharion Balloon, Donell Beers, etc  Will continue to follow

## 2011-03-15 NOTE — Patient Instructions (Signed)
Take antibiotics to calm down any infection.

## 2011-04-26 ENCOUNTER — Encounter (INDEPENDENT_AMBULATORY_CARE_PROVIDER_SITE_OTHER): Payer: Self-pay | Admitting: Surgery

## 2011-04-26 ENCOUNTER — Ambulatory Visit (INDEPENDENT_AMBULATORY_CARE_PROVIDER_SITE_OTHER): Payer: Medicare Other | Admitting: Surgery

## 2011-04-26 VITALS — BP 122/60 | HR 66 | Temp 96.4°F | Resp 16 | Ht 64.5 in | Wt 112.6 lb

## 2011-04-26 DIAGNOSIS — C20 Malignant neoplasm of rectum: Secondary | ICD-10-CM

## 2011-04-26 DIAGNOSIS — N823 Fistula of vagina to large intestine: Secondary | ICD-10-CM

## 2011-04-26 DIAGNOSIS — N824 Other female intestinal-genital tract fistulae: Secondary | ICD-10-CM

## 2011-04-26 NOTE — Progress Notes (Signed)
Subjective:     Patient ID: [name], female   DOB: 09/09/1916, 78 y.o.   MRN: [id number]  HPI   Patient Care Team: Kaleen Mask, MD as PCP - General (Family Medicine) Kathleene Hazel, MD as Consulting Physician (Cardiology) Graylin Shiver, MD as Consulting Physician (Gastroenterology) Ladene Artist, MD as Consulting Physician (Internal Medicine) Billie Lade, MD as Consulting Physician (Radiation Oncology)  This patient is a 78 y.o.female who presents today for surgical evaluation.   Procedure: Partial proctectomy by TEM for T2NX cancer 04Oct2012 Diverting loop sigmoid colostomy for post-op abscess & rectovaginal fistula Oct2012  Patient comes back today feeling stronger. She is walking well.   Appetite good.  Energy level good.  No pelvic pain/discomfort.  No fevers, chills, or sweats. No nausea or vomiting. 1-2 bowel movement in the bag daily.  She notes the vaginal drainage has stopped.  No discharge nor bleeding  Past Medical History  Diagnosis Date  . Arthritis   . Irregular heart beat   . Hyperlipidemia   . CAD (coronary artery disease)     a. s/p INF STEMI 7/12: tx with BMS to RCA;  b. cath 08/26/10: pLAD 30%, mLAD 50%, D1 40%, pCFX 95%, mRCA occluded;   c. staged PCI of pCFX with BMS;   d. echo 7/12:   EF 60-65%, mild RAE, mild to moderate AI, mild MR, moderate TR, RVE, PASP 47  . Hearing loss   . Bilateral external ear infections   . Rectal cancer     rectal  . Rectal Cancer 08/2010    adenocarcinoma  . Myocardial infarction 08/2010  . Hypertension   . Vaginal infection November 2012    pt on antibiotics because infection really causing her to be fatigued   . 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 01/11/2011    Past Surgical History  Procedure Date  . Abdominal hysterectomy   . Tonsillectomy   . Total hip arthroplasty   . Knee surgery   . External ear surgery   . Wrist surgery   . Cardiac surgery     2 stints put in  .  Partial protectomy 11/17/2010    with colostomy 10/9    History   Social History  . Marital Status: Widowed    Spouse Name: N/A    Number of Children: N/A  . Years of Education: N/A   Occupational History  . Not on file.   Social History Main Topics  . Smoking status: Never Smoker   . Smokeless tobacco: Never Used  . Alcohol Use: No  . Drug Use: No  . Sexually Active: Not on file   Other Topics Concern  . Not on file   Social History Narrative  . No narrative on file    Family History  Problem Relation Age of Onset  . Kidney disease Mother   . Heart disease Father   . Cancer Brother     Current outpatient prescriptions:acetaminophen (TYLENOL) 325 MG tablet, Take 650 mg by mouth every 6 (six) hours as needed.  , Disp: , Rfl: ;  aspirin 81 MG tablet, Take 81 mg by mouth daily.  , Disp: , Rfl: ;  Clopidogrel Bisulfate (PLAVIX PO), 75 mg Daily., Disp: , Rfl: ;  CRESTOR 40 MG tablet, Daily., Disp: , Rfl: ;  metoprolol tartrate (LOPRESSOR) 25 MG tablet, 1 tab three times a day, Disp: , Rfl:  Multiple Vitamin (MULTIVITAMIN) tablet, Take 1 tablet by mouth daily.  ,  Disp: , Rfl: ;  NITROSTAT 0.4 MG SL tablet, Ad lib., Disp: , Rfl:   Allergies  Allergen Reactions  . Effexor (Venlafaxine Hydrochloride)   . Sulfa Antibiotics Nausea Only  . Hydrocodone Nausea Only       Review of Systems  Constitutional: Negative for fever, chills, diaphoresis, appetite change and fatigue.  HENT: Negative for ear pain, sore throat and trouble swallowing.   Eyes: Negative for photophobia and visual disturbance.  Respiratory: Negative for cough and choking.   Cardiovascular: Negative for chest pain and palpitations.  Gastrointestinal: Negative for nausea, vomiting, abdominal pain, diarrhea, constipation, blood in stool, abdominal distention, anal bleeding and rectal pain.  Genitourinary: Positive for vaginal discharge. Negative for dysuria, urgency, frequency, hematuria, flank pain, decreased  urine volume, vaginal bleeding, enuresis, difficulty urinating, genital sores, vaginal pain and pelvic pain.  Musculoskeletal: Negative for myalgias and gait problem.  Skin: Negative for color change, pallor and rash.  Neurological: Negative for dizziness, speech difficulty, weakness and numbness.  Hematological: Negative for adenopathy.  Psychiatric/Behavioral: Negative for confusion and agitation. The patient is not nervous/anxious.    BP 122/60  Pulse 66  Temp(Src) 96.4 F (35.8 C) (Temporal)  Resp 16  Ht 5' 4.5" (1.638 m)  Wt 112 lb 9.6 oz (51.075 kg)  BMI 19.03 kg/m2 .    Objective:   Physical Exam  Constitutional: She is oriented to person, place, and time. She appears well-developed and well-nourished. No distress.  HENT:  Head: Normocephalic.  Mouth/Throat: Oropharynx is clear and moist. No oropharyngeal exudate.  Eyes: Conjunctivae and EOM are normal. Pupils are equal, round, and reactive to light. No scleral icterus.  Neck: Normal range of motion. No tracheal deviation present.  Cardiovascular: Normal rate and intact distal pulses.   Pulmonary/Chest: Effort normal. No respiratory distress. She exhibits no tenderness.  Abdominal: Soft. She exhibits no distension. There is no tenderness. Hernia confirmed negative in the right inguinal area and confirmed negative in the left inguinal area.       Incisions clean with normal healing ridges.  No hernias.  Colostomy in place  Genitourinary: Vaginal discharge found.       Vagina with L posterior proximal scar, ~8cm from introitus.    Perianal skin clean with good hygiene.  No pruritis.  No pilonidal disease.  No fissure.  No abscess/fistula.    Tolerates digital rectal exam.  Normal sphincter tone.    Hemorrhoidal piles WNL.  Mod stricturing allows finger ~6cm from anal verge.  Old soft mucus/stool.  No fistula felt      Musculoskeletal: Normal range of motion. She exhibits no tenderness.  Lymphadenopathy:       Right: No  inguinal adenopathy present.       Left: No inguinal adenopathy present.  Neurological: She is alert and oriented to person, place, and time. No cranial nerve deficit. She exhibits normal muscle tone. Coordination normal.  Skin: Skin is warm and dry. No rash noted. She is not diaphoretic.  Psychiatric: She has a normal mood and affect. Her behavior is normal. Judgment and thought content normal.       Very alert & sharp       Assessment:     Rectal abscess after TEM draining into vagina with rectovaginal fistula, possibly closed     Plan:     Since the drainage has stopped, lets get and enema to document fistula closure.  Try antegrade through the inferior barrel of the loop colostomy (if they cannot find it,  then do retrograde via anus).  If no fistula, then colostomy takedown:    The anatomy & physiology of the digestive tract was discussed.  The pathophysiology was discussed.  Possibility of remaining with an ostomy permanently was discussed.  I offered ostomy takedown.  Laparoscopic & open techniques were discussed.   Risks such as bleeding, infection, abscess, leak, reoperation, possible re-ostomy, injury to other organs, hernia, heart attack, death, and other risks were discussed.   I noted a good likelihood this will help address the problem.  Goals of post-operative recovery were discussed as well.  We will work to minimize complications.  Many questions were answered.  The patient expresses understanding & wishes to proceed with surgery.  Will need cardiology clearance.  If not stopped or fistula open >61months:   1.  Consider LAR rectosigmoid resection & RV fistula repair with proximal diversion & then ostomy takedown..  May reconsider radiation for local control while she is diverted.  She is hesitant to do this still...   2.  Keep colostomy indefinitely  She does not like either option.  I see no others that are going to work.  With the T2 lesion, her risk of local recurrence  (esp w refusal of rad & chemoTx, is significant.  LAR would help reduce that.  But it is a bigger operation.  Will continue to follow

## 2011-05-01 ENCOUNTER — Telehealth (INDEPENDENT_AMBULATORY_CARE_PROVIDER_SITE_OTHER): Payer: Self-pay

## 2011-05-01 ENCOUNTER — Telehealth: Payer: Self-pay | Admitting: Cardiovascular Disease

## 2011-05-01 DIAGNOSIS — N823 Fistula of vagina to large intestine: Secondary | ICD-10-CM

## 2011-05-01 NOTE — Telephone Encounter (Signed)
[name], Can we arrange for Ms. Kemler to come in to see me for cardiac clearance. There is a note from her surgeon regarding this last week. Thanks, chris

## 2011-05-01 NOTE — Telephone Encounter (Signed)
Called pt to let her know that we did move her appt up earlier from 4-11 to 3-19 per pt's request. The pt understands.

## 2011-05-02 ENCOUNTER — Ambulatory Visit (INDEPENDENT_AMBULATORY_CARE_PROVIDER_SITE_OTHER): Payer: Medicare Other | Admitting: Physician Assistant

## 2011-05-02 ENCOUNTER — Encounter: Payer: Self-pay | Admitting: Physician Assistant

## 2011-05-02 VITALS — BP 129/54 | HR 64 | Ht 64.5 in | Wt 112.0 lb

## 2011-05-02 DIAGNOSIS — I10 Essential (primary) hypertension: Secondary | ICD-10-CM

## 2011-05-02 DIAGNOSIS — C20 Malignant neoplasm of rectum: Secondary | ICD-10-CM

## 2011-05-02 DIAGNOSIS — R42 Dizziness and giddiness: Secondary | ICD-10-CM

## 2011-05-02 DIAGNOSIS — E785 Hyperlipidemia, unspecified: Secondary | ICD-10-CM

## 2011-05-02 DIAGNOSIS — I2510 Atherosclerotic heart disease of native coronary artery without angina pectoris: Secondary | ICD-10-CM

## 2011-05-02 NOTE — Telephone Encounter (Signed)
Pt has appt with Tereso Newcomer, PA on May 02, 2011

## 2011-05-02 NOTE — Patient Instructions (Signed)
Your physician recommends that you schedule a follow-up appointment in: PLEASE MAKE APPOINTMENT TO SEE DR. MCALHANY IN MAY  NO CHANGES TODAY

## 2011-05-02 NOTE — Progress Notes (Signed)
[address] [place], [place]  [address] Phone: [phone] Fax:  [phone]  Date:  05/02/2011   Name:  [name]       DOB:  03/26/1916 MRN:  [id number]  PCP:  Dr. Jeannetta Nap Primary Cardiologist:  Dr. Verne Carrow  Primary Electrophysiologist:  None    History of Present Illness: [name] is a 78 y.o. female who presents for surgical clearance.  She has a history of paroxysmal atrial fibrillation and recently diagnosed rectal cancer. She was admitted 08/2010 with an inferior STEMI.  Emergent cath done with placement of a BMS to the RCA for 100% occlusion. She was noted to have a high grade proximal CFX 95%. She was brought back to the cath lab a couple days later for staged PCI. She was treated with BMS to the CFX. Echo 7/14: EF 60-65%, mild RAE, mild to moderate AI, mild MR, moderate TR, RVE, PASP 47.   Last seen by Dr. Verne Carrow 12/2010.  She had undergone partial proctectomy with loop colostomy in 11/2010.  She developed an abscess with fistula.  She now needs surgery to take down her colostomy.  She is overall doing well.  The patient denies chest pain, shortness of breath, syncope, orthopnea, PND or significant pedal edema.  She is able to achieve > 4 METs.  She has been raking leaves without problems.  She is able to vacuum without difficulty.    Past Medical History  Diagnosis Date  . Arthritis   . Irregular heart beat   . Hyperlipidemia   . CAD (coronary artery disease)     a. s/p INF STEMI 7/12: tx with BMS to RCA;  b. cath 08/26/10: pLAD 30%, mLAD 50%, D1 40%, pCFX 95%, mRCA occluded;   c. staged PCI of pCFX with BMS;   d. echo 7/12:   EF 60-65%, mild RAE, mild to moderate AI, mild MR, moderate TR, RVE, PASP 47  . Hearing loss   . Bilateral external ear infections   . Rectal cancer     rectal  . Rectal Cancer 08/2010    adenocarcinoma  . Myocardial infarction 08/2010  . Hypertension   . Vaginal infection November  2012    pt on antibiotics because infection really causing her to be fatigued   . 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 01/11/2011    Current Outpatient Prescriptions  Medication Sig Dispense Refill  . acetaminophen (TYLENOL) 325 MG tablet Take 650 mg by mouth every 6 (six) hours as needed.        Marland Kitchen aspirin 81 MG tablet Take 81 mg by mouth daily.        Marland Kitchen Clopidogrel Bisulfate (PLAVIX PO) Take 75 mg by mouth Daily.       . CRESTOR 40 MG tablet Take 40 mg by mouth daily.       . metoprolol tartrate (LOPRESSOR) 25 MG tablet Take 25 mg by mouth 2 (two) times daily.       . Multiple Vitamin (MULTIVITAMIN) tablet Take 1 tablet by mouth daily.        Marland Kitchen NITROSTAT 0.4 MG SL tablet Place 0.4 mg under the tongue every 5 (five) minutes as needed.         Allergies: Allergies  Allergen Reactions  . Effexor (Venlafaxine Hydrochloride)   . Sulfa Antibiotics Nausea Only  . Hydrocodone Nausea Only    History  Substance Use Topics  . Smoking status: Never Smoker   .  Smokeless tobacco: Never Used  . Alcohol Use: No     ROS:  Please see the history of present illness.    All other systems reviewed and negative.   PHYSICAL EXAM: VS:  BP 129/54  Pulse 64  Ht 5' 4.5" (1.638 m)  Wt 112 lb (50.803 kg)  BMI 18.93 kg/m2 Well nourished, well developed, in no acute distress HEENT: normal Neck: no JVD Cardiac:  normal S1, S2; RRR; no murmur Lungs:  clear to auscultation bilaterally, no wheezing, rhonchi or rales Abd: soft, nontender, no hepatomegaly Ext: no edema Skin: warm and dry Neuro:  CNs 2-12 intact, no focal abnormalities noted  EKG:  Sinus rhythm, heart rate 60, left axis deviation, PACs, nonspecific ST-T wave changes, first degree AV block with a PR interval of 222 ms  ASSESSMENT AND PLAN:  1. CAD (coronary artery disease)  She is doing well.  She has no unstable cardiac conditions.  She requires no further cardiac workup prior to her non-cardiac surgery.  She should  be at acceptable risk.  She had bare metal stents placed.  If required, she may hold her Plavix and ASA 5-7 days out from surgery and resume post op when felt to be safe by surgery.  Our service will be available in the periop period as necessary.  She should remain on beta blocker throughout to reduce the risk of cardiovascular complications.  Follow up with Dr. Verne Carrow as directed.   2. Rectal cancer, pT2uN0(pNX) s/p TEM partial proctectomy  Surgery to take down colostomy pending.  As above.    3. HTN (hypertension)  Controlled.  Continue current therapy.    4. Hyperlipidemia  Continue crestor.     Luna Glasgow, PA-C  12:48 PM 05/02/2011

## 2011-05-04 ENCOUNTER — Telehealth (INDEPENDENT_AMBULATORY_CARE_PROVIDER_SITE_OTHER): Payer: Self-pay | Admitting: General Surgery

## 2011-05-04 ENCOUNTER — Ambulatory Visit (HOSPITAL_COMMUNITY)
Admission: RE | Admit: 2011-05-04 | Discharge: 2011-05-04 | Disposition: A | Discharge status: Home / Self Care | Payer: Medicare Other | Source: Ambulatory Visit | Attending: Surgery | Admitting: Surgery

## 2011-05-04 DIAGNOSIS — N824 Other female intestinal-genital tract fistulae: Secondary | ICD-10-CM | POA: Insufficient documentation

## 2011-05-04 DIAGNOSIS — C20 Malignant neoplasm of rectum: Secondary | ICD-10-CM

## 2011-05-04 DIAGNOSIS — N823 Fistula of vagina to large intestine: Secondary | ICD-10-CM

## 2011-05-04 NOTE — Telephone Encounter (Signed)
Called patient and left message for her to call back for results

## 2011-05-04 NOTE — Telephone Encounter (Signed)
Message copied by Liliana Cline on Thu May 04, 2011  2:34 PM ------      Message from: Ardeth Sportsman      Created: Thu May 04, 2011  2:02 PM       Still patent rectovaginal fistula = keep colostomy another 8 weeks & then redo enema to see if it heals            ----- Message -----         From: Rad Results In Interface         Sent: 05/04/2011  11:45 AM           To: Ardeth Sportsman, MD

## 2011-05-04 NOTE — Telephone Encounter (Signed)
Patient aware that fistula is still present. She is aware we will set up another enema in 8 weeks and call her with appt. She agrees with this plan.

## 2011-05-11 ENCOUNTER — Other Ambulatory Visit (HOSPITAL_COMMUNITY): Payer: Self-pay | Admitting: Internal Medicine

## 2011-05-19 ENCOUNTER — Other Ambulatory Visit: Payer: Self-pay | Admitting: Cardiovascular Disease

## 2011-05-19 MED ORDER — CLOPIDOGREL BISULFATE 75 MG PO TABS: 75.0000 mg | ORAL_TABLET | Freq: Every day | ORAL | Status: DC

## 2011-05-19 NOTE — Telephone Encounter (Signed)
Only one pill left

## 2011-05-25 ENCOUNTER — Ambulatory Visit: Payer: Medicare Other | Admitting: Cardiovascular Disease

## 2011-06-09 ENCOUNTER — Other Ambulatory Visit: Payer: Self-pay | Admitting: Physician Assistant

## 2011-07-03 ENCOUNTER — Ambulatory Visit: Payer: Medicare Other | Admitting: Cardiovascular Disease

## 2011-07-17 ENCOUNTER — Encounter: Payer: Self-pay | Admitting: Cardiovascular Disease

## 2011-07-17 ENCOUNTER — Ambulatory Visit (INDEPENDENT_AMBULATORY_CARE_PROVIDER_SITE_OTHER): Payer: Medicare Other | Admitting: Cardiovascular Disease

## 2011-07-17 VITALS — BP 150/70 | HR 85 | Ht 64.5 in | Wt 114.8 lb

## 2011-07-17 DIAGNOSIS — I2510 Atherosclerotic heart disease of native coronary artery without angina pectoris: Secondary | ICD-10-CM

## 2011-07-17 DIAGNOSIS — D1800 Hemangioma unspecified site: Secondary | ICD-10-CM

## 2011-07-17 NOTE — Assessment & Plan Note (Signed)
Her feet turn purple at night and feel cold. No swelling. Will get full LE arterial dopplers to exclude PAD.

## 2011-07-17 NOTE — Patient Instructions (Signed)
Your physician wants you to follow-up in: 6 months. You will receive a reminder letter in the mail two months in advance. If you don't receive a letter, please call our office to schedule the follow-up appointment.  Your physician has requested that you have a lower or upper extremity arterial duplex. This test is an ultrasound of the arteries in the legs or arms. It looks at arterial blood flow in the legs and arms. Allow one hour for Lower and Upper Arterial scans. There are no restrictions or special instructions  Your physician recommends that you return for fasting lab work on day of Doppler appointment. (lipid and liver profile)

## 2011-07-17 NOTE — Assessment & Plan Note (Signed)
Stable. She is on good medical therapy including ASA/Plavix/statin/beta blocker. She has no chest pain. No recent lipids. Will repeat lipids and LFTs. BP has been well controlled at home.

## 2011-07-17 NOTE — Progress Notes (Signed)
History of Present Illness: 78 yo WF with a history of CAD, moderate AI, mild MR, rectal cancer, PAF here today for cardiac follow up. She was admitted 08/2010 with an inferior STEMI. Emergent cath done with placement of a BMS to the RCA for 100% occlusion. She was noted to have a high grade proximal CFX 95%. She was brought back to the cath lab a couple days later for staged PCI. She was treated with BMS to the CFX. Echo 7/14: EF 60-65%, mild RAE, mild to moderate AI, mild MR, moderate TR, RVE, PASP 47. She has undergone partial proctectomy with loop colostomy in 11/2010. She developed an abscess with fistula. She has not had her colostomy taken down yet because of a rectovaginal fistula.    She is here today for follow up. She tells me that she feels tired. She had bloodwork in Dr. Jeannetta Nap office last week and everything was ok. She has no energy. She has mild SOB. She denies chest pain. No dizziness, near syncope or syncope, orthopnea, PND or significant pedal edema. She has constant discharge from her vagina secondary to her fistula. She does describe her feet turning purple at night and feeling cold. No pain in legs. No ulcerations.   Primary Care Physician: Windle Guard  Last Lipid Profile:  Lipid Panel     Component Value Date/Time   CHOL 137 11/03/2010 0942   TRIG 86.0 11/03/2010 0942   HDL 72.00 11/03/2010 0942   CHOLHDL 2 11/03/2010 0942   VLDL 17.2 11/03/2010 0942   LDLCALC 48 11/03/2010 0942     Past Medical History  Diagnosis Date  . Arthritis   . Irregular heart beat   . Hyperlipidemia   . CAD (coronary artery disease)     a. s/p INF STEMI 7/12: tx with BMS to RCA;  b. cath 08/26/10: pLAD 30%, mLAD 50%, D1 40%, pCFX 95%, mRCA occluded;   c. staged PCI of pCFX with BMS;   d. echo 7/12:   EF 60-65%, mild RAE, mild to moderate AI, mild MR, moderate TR, RVE, PASP 47  . Hearing loss   . Bilateral external ear infections   . Rectal cancer     rectal  . Rectal Cancer 08/2010   adenocarcinoma  . Myocardial infarction 08/2010  . Hypertension   . Vaginal infection November 2012    pt on antibiotics because infection really causing her to be fatigued   . 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 01/11/2011    Past Surgical History  Procedure Date  . Abdominal hysterectomy   . Tonsillectomy   . Total hip arthroplasty   . Knee surgery   . External ear surgery   . Wrist surgery   . Cardiac surgery     2 stints put in  . Partial protectomy 11/17/2010    with colostomy 10/9    Current Outpatient Prescriptions  Medication Sig Dispense Refill  . acetaminophen (TYLENOL) 325 MG tablet Take 650 mg by mouth every 6 (six) hours as needed.        Marland Kitchen aspirin 81 MG tablet Take 81 mg by mouth daily.        . clopidogrel (PLAVIX) 75 MG tablet Take 1 tablet (75 mg total) by mouth daily.  30 tablet  12  . CRESTOR 40 MG tablet TAKE ONE TABLET BY MOUTH AT BEDTIME  30 each  11  . metoprolol tartrate (LOPRESSOR) 25 MG tablet TAKE ONE TABLET BY MOUTH TWICE DAILY  60  tablet  6  . mometasone (ELOCON) 0.1 % cream Apply topically daily.      . Multiple Vitamin (MULTIVITAMIN) tablet Take 1 tablet by mouth daily.        Marland Kitchen NITROSTAT 0.4 MG SL tablet Place 0.4 mg under the tongue every 5 (five) minutes as needed.       Marland Kitchen DISCONTD: omeprazole (PRILOSEC) 40 MG capsule Take 40 mg by mouth as needed.        Allergies  Allergen Reactions  . Effexor (Venlafaxine Hydrochloride)   . Sulfa Antibiotics Nausea Only  . Hydrocodone Nausea Only    History   Social History  . Marital Status: Widowed    Spouse Name: N/A    Number of Children: N/A  . Years of Education: N/A   Occupational History  . Not on file.   Social History Main Topics  . Smoking status: Never Smoker   . Smokeless tobacco: Never Used  . Alcohol Use: No  . Drug Use: No  . Sexually Active: Not on file   Other Topics Concern  . Not on file   Social History Narrative  . No narrative on file     Family History  Problem Relation Age of Onset  . Kidney disease Mother   . Heart disease Father   . Cancer Brother     Review of Systems:  As stated in the HPI and otherwise negative.   BP 150/70  Pulse 85  Ht 5' 4.5" (1.638 m)  Wt 114 lb 12.8 oz (52.073 kg)  BMI 19.40 kg/m2  Physical Examination: General: Well developed, well nourished, NAD HEENT: OP clear, mucus membranes moist SKIN: warm, dry. No rashes. Neuro: No focal deficits Musculoskeletal: Muscle strength 5/5 all ext Psychiatric: Mood and affect normal Neck: No JVD, no carotid bruits, no thyromegaly, no lymphadenopathy. Lungs:Clear bilaterally, no wheezes, rhonci, crackles Cardiovascular: Regular rate and rhythm with ectopy. No murmurs, gallops or rubs. Abdomen:Soft. Bowel sounds present. Non-tender.  Extremities: No lower extremity edema. Pulses are not palpable in the bilateral DP/PT but I can doppler a pulse in the bilateral DP.

## 2011-07-31 ENCOUNTER — Encounter (INDEPENDENT_AMBULATORY_CARE_PROVIDER_SITE_OTHER): Payer: Medicare Other

## 2011-07-31 ENCOUNTER — Other Ambulatory Visit (INDEPENDENT_AMBULATORY_CARE_PROVIDER_SITE_OTHER): Payer: Medicare Other

## 2011-07-31 DIAGNOSIS — I2510 Atherosclerotic heart disease of native coronary artery without angina pectoris: Secondary | ICD-10-CM

## 2011-07-31 DIAGNOSIS — I739 Peripheral vascular disease, unspecified: Secondary | ICD-10-CM

## 2011-07-31 LAB — LIPID PANEL
LDL Cholesterol: 37 mg/dL (ref 0–99)
Total CHOL/HDL Ratio: 2
Triglycerides: 88 mg/dL (ref 0.0–149.0)
VLDL: 17.6 mg/dL (ref 0.0–40.0)

## 2011-07-31 LAB — HEPATIC FUNCTION PANEL
Albumin: 3.7 g/dL (ref 3.5–5.2)
Alkaline Phosphatase: 54 U/L (ref 39–117)
Total Protein: 7.2 g/dL (ref 6.0–8.3)

## 2011-08-18 ENCOUNTER — Telehealth (INDEPENDENT_AMBULATORY_CARE_PROVIDER_SITE_OTHER): Payer: Self-pay

## 2011-08-18 NOTE — Telephone Encounter (Signed)
LMOM for pt to call me back.

## 2011-09-20 ENCOUNTER — Telehealth (INDEPENDENT_AMBULATORY_CARE_PROVIDER_SITE_OTHER): Payer: Self-pay

## 2011-09-20 DIAGNOSIS — N823 Fistula of vagina to large intestine: Secondary | ICD-10-CM

## 2011-09-20 NOTE — Telephone Encounter (Signed)
Called pt to give her the date for her DG colon thru colostomy at [facility] on 8/15 arrive at 10:15 no prep just no food or liquids after midnight the night before xray. The pt understands. The pt is due for a colonoscopy in September. I will be back in touch with after the results.

## 2011-09-26 ENCOUNTER — Telehealth: Payer: Self-pay | Admitting: Cardiovascular Disease

## 2011-09-26 NOTE — Telephone Encounter (Signed)
Phoned pt who states she is feeling more fatigued and having more SOB than ever--just getting up to answer phone tires her out, and makes her SOB--she states she would like to see dr Clifton James and not a PA or NP--ADVISED DR Clifton James has an appoint available mon. 8/19 at 4:15pm and pt states she will take this appoint--advised if has trouble in meantime,go to nearest ED--pt agrees

## 2011-09-26 NOTE — Telephone Encounter (Signed)
Please return call to patient [phone]   C/O SOB and irregular heart beat on exhertion.

## 2011-09-26 NOTE — Telephone Encounter (Signed)
Thanks. cdm 

## 2011-09-28 ENCOUNTER — Ambulatory Visit (HOSPITAL_COMMUNITY)
Admission: RE | Admit: 2011-09-28 | Discharge: 2011-09-28 | Disposition: A | Discharge status: Home / Self Care | Payer: Medicare Other | Source: Ambulatory Visit | Attending: Surgery | Admitting: Surgery

## 2011-09-28 DIAGNOSIS — N824 Other female intestinal-genital tract fistulae: Secondary | ICD-10-CM | POA: Insufficient documentation

## 2011-09-28 DIAGNOSIS — K5730 Diverticulosis of large intestine without perforation or abscess without bleeding: Secondary | ICD-10-CM | POA: Insufficient documentation

## 2011-09-28 DIAGNOSIS — N823 Fistula of vagina to large intestine: Secondary | ICD-10-CM

## 2011-09-28 DIAGNOSIS — Z9049 Acquired absence of other specified parts of digestive tract: Secondary | ICD-10-CM | POA: Insufficient documentation

## 2011-09-29 ENCOUNTER — Telehealth (INDEPENDENT_AMBULATORY_CARE_PROVIDER_SITE_OTHER): Payer: Self-pay

## 2011-09-29 NOTE — Telephone Encounter (Signed)
Pt returned my call. I notified pt that her xray results show that the area is smaller but still a rectovaginal fistula is present. The pt was advised that she can either live with the diverting colostomy or get scheduled for lap LAR. The pt wishes to live with her colostomy for now. The pt will call us back if she changes her mind. The pt does not have a f/u appt with Dr Michaell Cowing.

## 2011-09-29 NOTE — Telephone Encounter (Signed)
LMOM for pt to call me so I can give her test results from Dr Michaell Cowing.

## 2011-10-02 ENCOUNTER — Ambulatory Visit (INDEPENDENT_AMBULATORY_CARE_PROVIDER_SITE_OTHER): Payer: Medicare Other | Admitting: Cardiovascular Disease

## 2011-10-02 ENCOUNTER — Encounter: Payer: Self-pay | Admitting: Cardiovascular Disease

## 2011-10-02 ENCOUNTER — Telehealth (INDEPENDENT_AMBULATORY_CARE_PROVIDER_SITE_OTHER): Payer: Self-pay | Admitting: General Surgery

## 2011-10-02 VITALS — BP 154/73 | HR 76 | Ht 64.5 in | Wt 113.1 lb

## 2011-10-02 DIAGNOSIS — I2510 Atherosclerotic heart disease of native coronary artery without angina pectoris: Secondary | ICD-10-CM

## 2011-10-02 DIAGNOSIS — R0602 Shortness of breath: Secondary | ICD-10-CM | POA: Insufficient documentation

## 2011-10-02 DIAGNOSIS — R0600 Dyspnea, unspecified: Secondary | ICD-10-CM

## 2011-10-02 DIAGNOSIS — R0609 Other forms of dyspnea: Secondary | ICD-10-CM

## 2011-10-02 DIAGNOSIS — R002 Palpitations: Secondary | ICD-10-CM | POA: Insufficient documentation

## 2011-10-02 NOTE — Progress Notes (Signed)
History of Present Illness: 78 yo WF with a history of CAD, moderate AI, mild MR, rectal cancer, PAF here today for cardiac follow up. She was admitted 08/2010 with an inferior STEMI. Emergent cath done with placement of a BMS to the RCA for 100% occlusion. She was noted to have a high grade proximal CFX 95%. She was brought back to the cath lab a couple days later for staged PCI. She was treated with BMS to the CFX. Echo 08/27/11: EF 60-65%, mild RAE, mild to moderate AI, mild MR, moderate TR, RVE, PASP 47. She has undergone partial proctectomy with loop colostomy in 11/2010. She developed an abscess with fistula. She has not had her colostomy taken down yet because of a rectovaginal fistula. She had a procedure last week to look at the fistula and it is still open.   She is here today for follow up. She tells me that she feels tired. She also notes dyspnea which is more severe than it had been. She has no energy. She denies chest pain. No dizziness, near syncope or syncope, orthopnea, PND or significant pedal edema. She has constant discharge from her vagina secondary to her fistula. No pain in legs. No ulcerations. She has noticed palpitations at night.   Primary Care Physician: Windle Guard   Last Lipid Profile:  Lipid Panel     Component Value Date/Time   CHOL 129 07/31/2011 1010   TRIG 88.0 07/31/2011 1010   HDL 74.20 07/31/2011 1010   CHOLHDL 2 07/31/2011 1010   VLDL 17.6 07/31/2011 1010   LDLCALC 37 07/31/2011 1010     Past Medical History  Diagnosis Date  . Arthritis   . Irregular heart beat   . Hyperlipidemia   . CAD (coronary artery disease)     a. s/p INF STEMI 7/12: tx with BMS to RCA;  b. cath 08/26/10: pLAD 30%, mLAD 50%, D1 40%, pCFX 95%, mRCA occluded;   c. staged PCI of pCFX with BMS;   d. echo 7/12:   EF 60-65%, mild RAE, mild to moderate AI, mild MR, moderate TR, RVE, PASP 47  . Hearing loss   . Bilateral external ear infections   . Rectal cancer     rectal  . Rectal  Cancer 08/2010    adenocarcinoma  . Myocardial infarction 08/2010  . Hypertension   . Vaginal infection November 2012    pt on antibiotics because infection really causing her to be fatigued   . 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 01/11/2011    Past Surgical History  Procedure Date  . Abdominal hysterectomy   . Tonsillectomy   . Total hip arthroplasty   . Knee surgery   . External ear surgery   . Wrist surgery   . Cardiac surgery     2 stints put in  . Partial protectomy 11/17/2010    with colostomy 10/9    Current Outpatient Prescriptions  Medication Sig Dispense Refill  . acetaminophen (TYLENOL) 325 MG tablet Take 650 mg by mouth every 6 (six) hours as needed.        Marland Kitchen aspirin 81 MG tablet Take 81 mg by mouth daily.        . clopidogrel (PLAVIX) 75 MG tablet Take 1 tablet (75 mg total) by mouth daily.  30 tablet  12  . CRESTOR 40 MG tablet TAKE ONE TABLET BY MOUTH AT BEDTIME  30 each  11  . metoprolol tartrate (LOPRESSOR) 25 MG tablet TAKE ONE  TABLET BY MOUTH TWICE DAILY  60 tablet  6  . mometasone (ELOCON) 0.1 % cream Apply topically daily.      . Multiple Vitamin (MULTIVITAMIN) tablet Take 1 tablet by mouth daily.        Marland Kitchen NITROSTAT 0.4 MG SL tablet Place 0.4 mg under the tongue every 5 (five) minutes as needed.       Marland Kitchen DISCONTD: omeprazole (PRILOSEC) 40 MG capsule Take 40 mg by mouth as needed.        Allergies  Allergen Reactions  . Effexor (Venlafaxine Hydrochloride)   . Sulfa Antibiotics Nausea Only  . Hydrocodone Nausea Only    History   Social History  . Marital Status: Widowed    Spouse Name: N/A    Number of Children: N/A  . Years of Education: N/A   Occupational History  . Not on file.   Social History Main Topics  . Smoking status: Never Smoker   . Smokeless tobacco: Never Used  . Alcohol Use: No  . Drug Use: No  . Sexually Active: Not on file   Other Topics Concern  . Not on file   Social History Narrative  . No narrative  on file    Family History  Problem Relation Age of Onset  . Kidney disease Mother   . Heart disease Father   . Cancer Brother     Review of Systems:  As stated in the HPI and otherwise negative.   BP 154/73  Pulse 76  Ht 5' 4.5" (1.638 m)  Wt 113 lb 1.9 oz (51.311 kg)  BMI 19.12 kg/m2  SpO2 99%  Physical Examination: General: Well developed, well nourished, NAD HEENT: OP clear, mucus membranes moist SKIN: warm, dry. No rashes. Neuro: No focal deficits Musculoskeletal: Muscle strength 5/5 all ext Psychiatric: Mood and affect normal Neck: No JVD, no carotid bruits, no thyromegaly, no lymphadenopathy. Lungs:Clear bilaterally, no wheezes, rhonci, crackles Cardiovascular: Regular rate and rhythm with ectopy with systolic murmur. No gallops or rubs. Abdomen:Soft. Bowel sounds present. Non-tender.  Extremities: No lower extremity edema. Pulses are trace  in the bilateral lower ext  EKG: NSR, rate 76 bpm. 1st degree AV block. Poor R wave progression through the precordial leads.

## 2011-10-02 NOTE — Telephone Encounter (Signed)
Pt called with questions about some postop procedure output.  She states she still has her [colostomy] bag, but felt a lot of pressure in her rectum and passed two large "globs of mucous" on Saturday, but none since then.  She is noting some "pinkish fluid" now on her pad.  Reassured pt that what she is describing is not out of the ordinary.  She was satisfied with this answer.

## 2011-10-02 NOTE — Assessment & Plan Note (Signed)
Will arrange 48 hour monitor to exclude arrythmias.

## 2011-10-02 NOTE — Patient Instructions (Signed)
Your physician recommends that you schedule a follow-up appointment in: 2 weeks.   Your physician has requested that you have an echocardiogram. Echocardiography is a painless test that uses sound waves to create images of your heart. It provides your doctor with information about the size and shape of your heart and how well your heart's chambers and valves are working. This procedure takes approximately one hour. There are no restrictions for this procedure.   Your physician has recommended that you wear a holter monitor. Holter monitors are medical devices that record the heart's electrical activity. Doctors most often use these monitors to diagnose arrhythmias. Arrhythmias are problems with the speed or rhythm of the heartbeat. The monitor is a small, portable device. You can wear one while you do your normal daily activities. This is usually used to diagnose what is causing palpitations/syncope (passing out).

## 2011-10-02 NOTE — Assessment & Plan Note (Signed)
Likely multi-factorial. She has a chronic draining rectovaginal fistula. I worry that she may be volume depleted. Will encourage po intake. Will check BMET, CBC and TSH today. Will arrange repeat echo to assess valves/LV function. She is known to have moderate valvular disease. Will have her wear a 48 hour monitor to make sure this is not driven by an arrythmia such as atrial fibrillation.

## 2011-10-02 NOTE — Assessment & Plan Note (Addendum)
Stable. No chest pains. EKG is unchanged. Lipids are at goal. I do not think her weakness is driven by ischemia. Will pursue other causes before repeating stress test or cath in this elderly female.

## 2011-10-03 ENCOUNTER — Telehealth: Payer: Self-pay | Admitting: Cardiovascular Disease

## 2011-10-03 LAB — CBC WITH DIFFERENTIAL/PLATELET
Basophils Relative: 0.5 % (ref 0.0–3.0)
Eosinophils Relative: 2.8 % (ref 0.0–5.0)
HCT: 37.9 % (ref 36.0–46.0)
Hemoglobin: 12.8 g/dL (ref 12.0–15.0)
Lymphocytes Relative: 25.6 % (ref 12.0–46.0)
Lymphs Abs: 1.9 10*3/uL (ref 0.7–4.0)
Monocytes Relative: 6.5 % (ref 3.0–12.0)
Neutro Abs: 4.8 10*3/uL (ref 1.4–7.7)
RBC: 3.98 Mil/uL (ref 3.87–5.11)
RDW: 12 % (ref 11.5–14.6)
WBC: 7.4 10*3/uL (ref 4.5–10.5)

## 2011-10-03 LAB — BASIC METABOLIC PANEL
BUN: 17 mg/dL (ref 6–23)
CO2: 30 mEq/L (ref 19–32)
Chloride: 104 mEq/L (ref 96–112)
Creatinine, Ser: 0.7 mg/dL (ref 0.4–1.2)
Glucose, Bld: 84 mg/dL (ref 70–99)
Potassium: 4.5 mEq/L (ref 3.5–5.1)

## 2011-10-03 NOTE — Telephone Encounter (Signed)
Fu call °Patient returning your call °

## 2011-10-03 NOTE — Telephone Encounter (Signed)
Spoke with pt and reviewed lab results with her.

## 2011-10-04 ENCOUNTER — Telehealth (INDEPENDENT_AMBULATORY_CARE_PROVIDER_SITE_OTHER): Payer: Self-pay

## 2011-10-04 NOTE — Telephone Encounter (Signed)
Called pt to let her know that she was due to have a colonoscopy 107yr from her last sx date which was 11/17/10 due to rectal cancer. The pt needs to be scoped 61yr from surgery date per Dr Michaell Cowing. I called her GI office Dr Evette Cristal and scheduled an office visit for 10/13/11 arrive at 11:00 to go meet with him to discuss the colonoscopy. Their office should schedule the colonoscopy for the beginning of October. I asked for the pt to call me after she see's Dr Evette Cristal to give me the colonoscopy date. The pt did mention that when she had her xray done last week she has been experiencing a lot more drainage coming from her bottom now. The pt is having to change her pads 3x's a day. I did notify Dr Michaell Cowing about the drainage but he is not alarmed b/c of her having the rectovaginal fistula still. The pt did chose last week not to do anymore surgery as of right now she wants to keep her colostomy.

## 2011-10-10 ENCOUNTER — Encounter (INDEPENDENT_AMBULATORY_CARE_PROVIDER_SITE_OTHER): Payer: Medicare Other

## 2011-10-10 ENCOUNTER — Ambulatory Visit (HOSPITAL_COMMUNITY): Payer: Medicare Other | Attending: Cardiology | Admitting: Radiology

## 2011-10-10 DIAGNOSIS — I079 Rheumatic tricuspid valve disease, unspecified: Secondary | ICD-10-CM | POA: Insufficient documentation

## 2011-10-10 DIAGNOSIS — R002 Palpitations: Secondary | ICD-10-CM

## 2011-10-10 DIAGNOSIS — R0609 Other forms of dyspnea: Secondary | ICD-10-CM | POA: Insufficient documentation

## 2011-10-10 DIAGNOSIS — I379 Nonrheumatic pulmonary valve disorder, unspecified: Secondary | ICD-10-CM | POA: Insufficient documentation

## 2011-10-10 DIAGNOSIS — R0600 Dyspnea, unspecified: Secondary | ICD-10-CM

## 2011-10-10 DIAGNOSIS — I2510 Atherosclerotic heart disease of native coronary artery without angina pectoris: Secondary | ICD-10-CM | POA: Insufficient documentation

## 2011-10-10 DIAGNOSIS — I10 Essential (primary) hypertension: Secondary | ICD-10-CM | POA: Insufficient documentation

## 2011-10-10 DIAGNOSIS — R0989 Other specified symptoms and signs involving the circulatory and respiratory systems: Secondary | ICD-10-CM | POA: Insufficient documentation

## 2011-10-10 DIAGNOSIS — I080 Rheumatic disorders of both mitral and aortic valves: Secondary | ICD-10-CM | POA: Insufficient documentation

## 2011-10-10 HISTORY — PX: TRANSTHORACIC ECHOCARDIOGRAM: SHX275

## 2011-10-10 NOTE — Progress Notes (Signed)
Echocardiogram performed.  

## 2011-10-17 ENCOUNTER — Telehealth: Payer: Self-pay | Admitting: Cardiovascular Disease

## 2011-10-17 DIAGNOSIS — R002 Palpitations: Secondary | ICD-10-CM

## 2011-10-17 MED ORDER — METOPROLOL TARTRATE 50 MG PO TABS: 50.0000 mg | ORAL_TABLET | Freq: Two times a day (BID) | ORAL | Status: DC

## 2011-10-17 NOTE — Addendum Note (Signed)
Addended by: Dossie Arbour on: 10/17/2011 05:49 PM   Modules accepted: Orders

## 2011-10-17 NOTE — Telephone Encounter (Signed)
Spoke with pt and reviewed monitor results and instructions from Dr. Clifton James. She will make change in medication today  but does not want new prescription sent in until she sees Dr. Clifton James on Sept 6, 2013. She has enough of medication to last until this office visit.

## 2011-10-17 NOTE — Telephone Encounter (Signed)
Holter shows SVT runs, (none longer than 9 beats) with PACs and PVCs. Can we increase her Lopressor to 50 mg po BID? Thanks, chris

## 2011-10-18 NOTE — Telephone Encounter (Signed)
Pt was referred to Dr Evette Cristal for her yearly screening colonoscopy b/c it's been one year since her surgery. The pt did meet with Dr Evette Cristal and they discussed repeating the colonoscopy but the pt really doesn't want to repeat the colonoscopy. They discussed what would the pt do if something else turned up after the colonosocpy would the pt have surgery again and the pt said at her age she prob. Would not have surgery again. We received note from Dr Evette Cristal dated 8/30 we will have scanned into epic.

## 2011-10-20 ENCOUNTER — Encounter: Payer: Self-pay | Admitting: Cardiovascular Disease

## 2011-10-20 ENCOUNTER — Ambulatory Visit (INDEPENDENT_AMBULATORY_CARE_PROVIDER_SITE_OTHER): Payer: Medicare Other | Admitting: Cardiovascular Disease

## 2011-10-20 VITALS — BP 155/61 | HR 61 | Ht 64.5 in | Wt 114.0 lb

## 2011-10-20 DIAGNOSIS — I25810 Atherosclerosis of coronary artery bypass graft(s) without angina pectoris: Secondary | ICD-10-CM

## 2011-10-20 DIAGNOSIS — R002 Palpitations: Secondary | ICD-10-CM

## 2011-10-20 MED ORDER — METOPROLOL TARTRATE 50 MG PO TABS: 50.0000 mg | ORAL_TABLET | Freq: Two times a day (BID) | ORAL | Status: DC

## 2011-10-20 NOTE — Progress Notes (Signed)
History of Present Illness: 78 yo WF with a history of CAD, moderate AI, mild MR, rectal cancer, PAF here today for cardiac follow up. She was admitted 08/2010 with an inferior STEMI. Emergent cath done with placement of a BMS to the RCA for 100% occlusion. She was noted to have a high grade proximal CFX 95%. She was brought back to the cath lab a couple days later for staged PCI. She was treated with BMS to the CFX. Echo 08/27/11: EF 60-65%, mild RAE, mild to moderate AI, mild MR, moderate TR, RVE, PASP 47. She has undergone partial proctectomy with loop colostomy in 11/2010. She developed an abscess with fistula. She has not had her colostomy taken down yet because of a rectovaginal fistula. I saw her 10/02/11 and she had c/o fatigue, dyspnea and palpitations. I arranged an echo which showed normal LV size and function with mild to moderate AI, moderate TR. 48 hour holter monitor showed NSR with PACs and runs of SVT with rare PVCs. No VT or atrial fibrillation noted. I increased her metoprolol.   She is here today for follow up. She has not felt palpitations. No chest pain but she feels tired. She still has drainage from her fistula. She has been trying to keep her po intake up.   Primary Care Physician: Windle Guard   Last Lipid Profile:  Lipid Panel    Component  Value  Date/Time    CHOL  129  07/31/2011 1010    TRIG  88.0  07/31/2011 1010    HDL  74.20  07/31/2011 1010    CHOLHDL  2  07/31/2011 1010    VLDL  17.6  07/31/2011 1010    LDLCALC  37  07/31/2011 1010     Past Medical History  Diagnosis Date  . Arthritis   . Irregular heart beat   . Hyperlipidemia   . CAD (coronary artery disease)     a. s/p INF STEMI 7/12: tx with BMS to RCA;  b. cath 08/26/10: pLAD 30%, mLAD 50%, D1 40%, pCFX 95%, mRCA occluded;   c. staged PCI of pCFX with BMS;   d. echo 7/12:   EF 60-65%, mild RAE, mild to moderate AI, mild MR, moderate TR, RVE, PASP 47  . Hearing loss   . Bilateral external ear infections     . Rectal cancer     rectal  . Rectal Cancer 08/2010    adenocarcinoma  . Myocardial infarction 08/2010  . Hypertension   . Vaginal infection November 2012    pt on antibiotics because infection really causing her to be fatigued   . 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 01/11/2011    Past Surgical History  Procedure Date  . Abdominal hysterectomy   . Tonsillectomy   . Total hip arthroplasty   . Knee surgery   . External ear surgery   . Wrist surgery   . Cardiac surgery     2 stints put in  . Partial protectomy 11/17/2010    with colostomy 10/9    Current Outpatient Prescriptions  Medication Sig Dispense Refill  . acetaminophen (TYLENOL) 325 MG tablet Take 650 mg by mouth every 6 (six) hours as needed.        Marland Kitchen aspirin 81 MG tablet Take 81 mg by mouth daily.        . clopidogrel (PLAVIX) 75 MG tablet Take 1 tablet (75 mg total) by mouth daily.  30 tablet  12  .  CRESTOR 40 MG tablet TAKE ONE TABLET BY MOUTH AT BEDTIME  30 each  11  . metoprolol tartrate (LOPRESSOR) 50 MG tablet Take 1 tablet (50 mg total) by mouth 2 (two) times daily.  60 tablet  6  . Multiple Vitamin (MULTIVITAMIN) tablet Take 1 tablet by mouth daily.        Marland Kitchen NITROSTAT 0.4 MG SL tablet Place 0.4 mg under the tongue every 5 (five) minutes as needed.       . zolpidem (AMBIEN) 5 MG tablet Take 5 mg by mouth as needed.       Marland Kitchen DISCONTD: omeprazole (PRILOSEC) 40 MG capsule Take 40 mg by mouth as needed.        Allergies  Allergen Reactions  . Effexor (Venlafaxine Hydrochloride)   . Sulfa Antibiotics Nausea Only  . Hydrocodone Nausea Only    History   Social History  . Marital Status: Widowed    Spouse Name: N/A    Number of Children: N/A  . Years of Education: N/A   Occupational History  . Not on file.   Social History Main Topics  . Smoking status: Never Smoker   . Smokeless tobacco: Never Used  . Alcohol Use: No  . Drug Use: No  . Sexually Active: Not on file   Other Topics  Concern  . Not on file   Social History Narrative  . No narrative on file    Family History  Problem Relation Age of Onset  . Kidney disease Mother   . Heart disease Father   . Cancer Brother     Review of Systems:  As stated in the HPI and otherwise negative.   BP 155/61  Pulse 61  Ht 5' 4.5" (1.638 m)  Wt 114 lb (51.71 kg)  BMI 19.27 kg/m2  Physical Examination: General: Well developed, well nourished, NAD HEENT: OP clear, mucus membranes moist SKIN: warm, dry. No rashes. Neuro: No focal deficits Musculoskeletal: Muscle strength 5/5 all ext Psychiatric: Mood and affect normal Neck: No JVD, no carotid bruits, no thyromegaly, no lymphadenopathy. Lungs:Clear bilaterally, no wheezes, rhonci, crackles Cardiovascular: Regular rate and rhythm. No murmurs, gallops or rubs. Abdomen:Soft. Bowel sounds present. Non-tender.  Extremities: No lower extremity edema. Pulses are 2 + in the bilateral DP/PT.  Echo 10/10/11:  Left ventricle: The cavity size was normal. There was mild focal basal hypertrophy of the septum. Systolic function was normal. The estimated ejection fraction was in the range of 55% to 60%. Wall motion was normal; there were no regional wall motion abnormalities. Doppler parameters are consistent with abnormal left ventricular relaxation (grade 1 diastolic dysfunction). - Aortic valve: There was no stenosis. Mild to moderate regurgitation. - Mitral valve: Severely calcified annulus. There was no evidence for stenosis. Trivial regurgitation. - Left atrium: The atrium was moderately dilated. - Right ventricle: The cavity size was normal. Systolic function was normal. - Right atrium: The atrium was mildly dilated. - Tricuspid valve: Mild-moderate regurgitation directed eccentrically. - Pulmonary arteries: PA peak pressure: 29mm Hg (S). PA systolic pressure is likely underestimated as the TR jet was very eccentric and doppler interrogation was off axis. -  Inferior vena cava: The vessel was normal in size; the respirophasic diameter changes were in the normal range (= 50%); findings are consistent with normal central venous pressure. Impressions:  - Normal LV size with mild focal basal septal hypertrophy. EF 55-60%. Normal RV size and systolic function. Biatrial enlargement. Mild to moderate AI. Mild to moderate eccentric TR. PA  systolic pressure is likely underestimated as TR jet was very eccentric and dopper interrogation was off axis.    Assessment and Plan:   1. CAD: Stable. No chest pains.  Lipids are at goal. I do not think her weakness and fatigue is driven by ischemia. I have discussed excluding ischemia with a stress test but she does not wish to pursue this at this time.   2. Dyspnea: Likely multi-factorial. She has a chronic draining rectovaginal fistula. I worry that she may be volume depleted. Will encourage po intake. BMET, CBC and TSH ok at last visit. LV function normal on recent echo. Palpitations improved on increased dose of beta blocker.    3. Palpitations: Improved on higher dose of metoprolol. Likely due to SVT and PACs.

## 2011-10-20 NOTE — Patient Instructions (Addendum)
Your physician wants you to follow-up in:  6 months. You will receive a reminder letter in the mail two months in advance. If you don't receive a letter, please call our office to schedule the follow-up appointment.   

## 2011-10-30 ENCOUNTER — Telehealth (INDEPENDENT_AMBULATORY_CARE_PROVIDER_SITE_OTHER): Payer: Self-pay

## 2011-10-30 NOTE — Telephone Encounter (Signed)
Pt had bloody discharge over the weekend.  She said that it had been pink, but then became red.  It is pink again today.  I explained that this was normal for a vaginal fistula. She has an appt with Dr. Michaell Cowing this week, and I advised her to discuss with him at her visit.

## 2011-10-31 ENCOUNTER — Telehealth: Payer: Self-pay | Admitting: Cardiovascular Disease

## 2011-10-31 DIAGNOSIS — R002 Palpitations: Secondary | ICD-10-CM

## 2011-10-31 MED ORDER — METOPROLOL TARTRATE 25 MG PO TABS: 25.0000 mg | ORAL_TABLET | Freq: Two times a day (BID) | ORAL | Status: DC

## 2011-10-31 NOTE — Telephone Encounter (Signed)
Pt calling re medication °

## 2011-10-31 NOTE — Telephone Encounter (Signed)
Spoke with pt who is asking about decreasing lopressor.   This was recently increased to 50 twice daily based on holter results. Pt states she has noticed increased weakness since medication increased.  Saw primary MD yesterday and was started on antibiotic for bladder infection.  States blood pressure was 134/40 at this visit and MD mentioned she may want to cut medicine back to 25.  Pt is calling to see what Dr. Clifton James would like her to do. She is not having any palpitations.

## 2011-10-31 NOTE — Telephone Encounter (Signed)
OK to go back to 25 mg po BID. cdm

## 2011-10-31 NOTE — Telephone Encounter (Signed)
Pt notified. She will decrease lopressor to 25 mg twice daily and let us know if she has palpitations. Does not need new prescription at this time.

## 2011-11-01 ENCOUNTER — Ambulatory Visit (INDEPENDENT_AMBULATORY_CARE_PROVIDER_SITE_OTHER): Payer: Medicare Other | Admitting: Surgery

## 2011-11-01 ENCOUNTER — Encounter (INDEPENDENT_AMBULATORY_CARE_PROVIDER_SITE_OTHER): Payer: Self-pay | Admitting: Surgery

## 2011-11-01 VITALS — BP 150/70 | HR 74 | Temp 97.4°F | Ht 64.0 in | Wt 113.4 lb

## 2011-11-01 DIAGNOSIS — N824 Other female intestinal-genital tract fistulae: Secondary | ICD-10-CM

## 2011-11-01 DIAGNOSIS — C20 Malignant neoplasm of rectum: Secondary | ICD-10-CM

## 2011-11-01 DIAGNOSIS — R109 Unspecified abdominal pain: Secondary | ICD-10-CM

## 2011-11-01 DIAGNOSIS — N823 Fistula of vagina to large intestine: Secondary | ICD-10-CM

## 2011-11-01 NOTE — Patient Instructions (Addendum)
Obtain CT scan to evaluate the abdomen & pelvis  See the Handout(s) we gave you.  Consider surgery to remove the rectum that contained the rectal cancer and current rectovaginal fistula.  Also close the opening in the vagina.  Would also need a temporary diverting ileostomy that would be taken down a few months later.  This will help remove and fix the fistula..  Please call our office at [phone] after the CT scan if you wish to schedule surgery or if you have further questions / concerns.

## 2011-11-01 NOTE — Progress Notes (Signed)
Subjective:     Patient ID: [name], female   DOB: 07-08-1916, 78 y.o.   MRN: [id number]  HPI   Patient Care Team: Kaleen Mask, MD as PCP - General (Family Medicine) Kathleene Hazel, MD as Consulting Physician (Cardiology) Graylin Shiver, MD as Consulting Physician (Gastroenterology) Ladene Artist, MD as Consulting Physician (Internal Medicine) Billie Lade, MD as Consulting Physician (Radiation Oncology)  This patient is a 78 y.o.female who presents today for surgical evaluation.   Procedure: Partial proctectomy by TEM for T2NX cancer 04Oct2012 Diverting loop sigmoid colostomy for post-op abscess & rectovaginal fistula Oct2012  Patient comes back today feeling concerned.  Appetite OK.  Energy level OK.  Has had a few instances of nausea and abdominal discomfort with eating.  Usually in the upper abdomen.  No pelvic pain/discomfort.  Has noticed intermittent vaginal drainage and bleeding, especially last week.  Had urgency and had a small bowel movement of mucus.  Surprised her.  Empties the colostomy bag twice a day.  Refuses all bowel regimens.  Declined colonoscopy followup.  No fevers, chills, or sweats. No vomiting.   She has declined any consideration of surgery.  No chemotherapy.  No radiation therapy.  Enema through colostomy last month notes the rectovaginal fistula persists.  She had some drainage the first day after the enema and then that calm down.  Past Medical History  Diagnosis Date  . Arthritis   . Irregular heart beat   . Hyperlipidemia   . CAD (coronary artery disease)     a. s/p INF STEMI 7/12: tx with BMS to RCA;  b. cath 08/26/10: pLAD 30%, mLAD 50%, D1 40%, pCFX 95%, mRCA occluded;   c. staged PCI of pCFX with BMS;   d. echo 7/12:   EF 60-65%, mild RAE, mild to moderate AI, mild MR, moderate TR, RVE, PASP 47  . Hearing loss   . Bilateral external ear infections   . Rectal cancer     rectal  . Rectal Cancer 08/2010    adenocarcinoma  .  Myocardial infarction 08/2010  . Hypertension   . Vaginal infection November 2012    pt on antibiotics because infection really causing her to be fatigued   . 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 01/11/2011    Past Surgical History  Procedure Date  . Abdominal hysterectomy   . Tonsillectomy   . Total hip arthroplasty   . Knee surgery   . External ear surgery   . Wrist surgery   . Cardiac surgery     2 stints put in  . Partial protectomy 11/17/2010    with colostomy 10/9    History   Social History  . Marital Status: Widowed    Spouse Name: N/A    Number of Children: N/A  . Years of Education: N/A   Occupational History  . Not on file.   Social History Main Topics  . Smoking status: Never Smoker   . Smokeless tobacco: Never Used  . Alcohol Use: No  . Drug Use: No  . Sexually Active: Not on file   Other Topics Concern  . Not on file   Social History Narrative  . No narrative on file    Family History  Problem Relation Age of Onset  . Kidney disease Mother   . Heart disease Father   . Cancer Brother     Current outpatient prescriptions:acetaminophen (TYLENOL) 325 MG tablet, Take 650 mg by mouth every  6 (six) hours as needed.  , Disp: , Rfl: ;  aspirin 81 MG tablet, Take 81 mg by mouth daily.  , Disp: , Rfl: ;  clopidogrel (PLAVIX) 75 MG tablet, Take 1 tablet (75 mg total) by mouth daily., Disp: 30 tablet, Rfl: 12;  CRESTOR 40 MG tablet, TAKE ONE TABLET BY MOUTH AT BEDTIME, Disp: 30 each, Rfl: 11 metoprolol (LOPRESSOR) 25 MG tablet, Take 1 tablet (25 mg total) by mouth 2 (two) times daily., Disp: 60 tablet, Rfl: 6;  Multiple Vitamin (MULTIVITAMIN) tablet, Take 1 tablet by mouth daily.  , Disp: , Rfl: ;  NITROSTAT 0.4 MG SL tablet, Place 0.4 mg under the tongue every 5 (five) minutes as needed. , Disp: , Rfl: ;  zolpidem (AMBIEN) 5 MG tablet, Take 5 mg by mouth as needed. , Disp: , Rfl: ;  ciprofloxacin (CIPRO) 500 MG tablet, , Disp: , Rfl:  DISCONTD:  omeprazole (PRILOSEC) 40 MG capsule, Take 40 mg by mouth as needed., Disp: , Rfl:   Allergies  Allergen Reactions  . Effexor (Venlafaxine Hydrochloride)   . Sulfa Antibiotics Nausea Only  . Hydrocodone Nausea Only       Review of Systems  Constitutional: Negative for fever, chills, diaphoresis, appetite change and fatigue.  HENT: Negative for ear pain, sore throat and trouble swallowing.   Eyes: Negative for photophobia and visual disturbance.  Respiratory: Negative for cough and choking.   Cardiovascular: Negative for chest pain and palpitations.  Gastrointestinal: Negative for nausea, vomiting, abdominal pain, diarrhea, constipation, blood in stool, abdominal distention, anal bleeding and rectal pain.  Genitourinary: Positive for vaginal discharge. Negative for dysuria, urgency, frequency, hematuria, flank pain, decreased urine volume, vaginal bleeding, enuresis, difficulty urinating, genital sores, vaginal pain and pelvic pain.  Musculoskeletal: Negative for myalgias and gait problem.  Skin: Negative for color change, pallor and rash.  Neurological: Negative for dizziness, speech difficulty, weakness and numbness.  Hematological: Negative for adenopathy.  Psychiatric/Behavioral: Negative for confusion and agitation. The patient is not nervous/anxious.    BP 150/70  Pulse 74  Temp 97.4 F (36.3 C) (Temporal)  Ht 5\' 4"  (1.626 m)  Wt 113 lb 6.4 oz (51.438 kg)  BMI 19.47 kg/m2 .    Objective:   Physical Exam  Constitutional: She is oriented to person, place, and time. She appears well-developed and well-nourished. No distress.  HENT:  Head: Normocephalic.  Mouth/Throat: Oropharynx is clear and moist. No oropharyngeal exudate.  Eyes: Conjunctivae normal and EOM are normal. Pupils are equal, round, and reactive to light. No scleral icterus.  Neck: Normal range of motion. No tracheal deviation present.  Cardiovascular: Normal rate and intact distal pulses.   Pulmonary/Chest:  Effort normal. No respiratory distress. She exhibits no tenderness.  Abdominal: Soft. She exhibits no distension. There is no tenderness. Hernia confirmed negative in the right inguinal area and confirmed negative in the left inguinal area.       Incisions clean with normal healing ridges.  No hernias.  Colostomy in place  Genitourinary: Vaginal discharge found.       Vagina with L posterior proximal scar, ~8cm from introitus.    Perianal skin clean with good hygiene.  No pruritis.  No pilonidal disease.  No fissure.  No abscess/fistula.    Tolerates digital rectal exam.  Normal sphincter tone.    Hemorrhoidal piles WNL.  Mod stricturing allows finger ~8cm from anal verge.  Old soft mucus/stool.  No fistula felt      Musculoskeletal: Normal range of  motion. She exhibits no tenderness.  Lymphadenopathy:       Right: No inguinal adenopathy present.       Left: No inguinal adenopathy present.  Neurological: She is alert and oriented to person, place, and time. No cranial nerve deficit. She exhibits normal muscle tone. Coordination normal.  Skin: Skin is warm and dry. No rash noted. She is not diaphoretic.  Psychiatric: She has a normal mood and affect. Her behavior is normal. Judgment and thought content normal.       Very alert & sharp       Assessment:     Rectovaginal fistula Persistent over six months in the setting of partial proctectomy for a T2 rectal cancer.    Plan:     Because the fistula has remained open >27months, she would require surgery to close it: lap LAR rectosigmoid resection & RV fistula repair with omental patch & with proximal ileal diversion.  Then ileostomy takedown ~3 months later.  She does have coronary disease and she is of advanced age.  It is not a low risk operation but I think she is a reasonable risk given she has good exercise tolerance and has remained physically stable for the past year.  Certainly technically feasible.  She understandably is hesitant to  do this.  I also cautioned her that the risk of local recurrence is higher with a TEM partial proctectomy and the persistence of the fistula could mean she has tumor recurrence as well.  The only way to know this is biopsy and removal.  While it is a risk of procedure, it would give the best chance at removing the fistula & keeping it from coming back.  I do not think I can get away with avoiding a temporary ileostomy.  I would plan to do a PICC line with infusions as an outpatient to get her through that safely and minimize the risk of dehydration, ARF, wound breakdown and leak.    She would need cardiology clearance.  The other option is to keep the colostomy indefinitely.  She keeps struggling with problems though.  She does not like either option.  I see no others that are going to work.  With the T2 lesion, her risk of local recurrence (esp w refusal of rad & chemoTx) is significant.  LAR would help reduce that.  But it is a bigger operation.  She seems more open to the idea of surgery now.  She wishes discussed with her family.  I also noted it is reasonable to get a second opinion elsewhere, and I could help her with names.  I have discussed it with my partners numerous times.  I will discuss it with them again.  In the meantime, I will lead to get a CT scan to make sure there is no other surprises in the abdomen / pelvis given the new complaints of upper abdominal pain and discomfort.

## 2011-11-06 ENCOUNTER — Other Ambulatory Visit (INDEPENDENT_AMBULATORY_CARE_PROVIDER_SITE_OTHER): Payer: Self-pay | Admitting: Surgery

## 2011-11-06 ENCOUNTER — Other Ambulatory Visit: Payer: Medicare Other

## 2011-11-06 ENCOUNTER — Ambulatory Visit
Admission: RE | Admit: 2011-11-06 | Discharge: 2011-11-06 | Disposition: A | Discharge status: Home / Self Care | Payer: Medicare Other | Source: Ambulatory Visit | Attending: Surgery | Admitting: Surgery

## 2011-11-06 DIAGNOSIS — N823 Fistula of vagina to large intestine: Secondary | ICD-10-CM

## 2011-11-06 DIAGNOSIS — C20 Malignant neoplasm of rectum: Secondary | ICD-10-CM

## 2011-11-06 MED ORDER — IOHEXOL 300 MG/ML  SOLN
100.0000 mL | Freq: Once | INTRAMUSCULAR | Status: AC | PRN
  Administered 2011-11-06: 100 mL via INTRAVENOUS

## 2011-11-07 ENCOUNTER — Telehealth (INDEPENDENT_AMBULATORY_CARE_PROVIDER_SITE_OTHER): Payer: Self-pay

## 2011-11-07 NOTE — Telephone Encounter (Signed)
Tried to call pt but no answer so couldn't leave a message. I will try again later to call pt with CT results.

## 2011-11-07 NOTE — Telephone Encounter (Signed)
Pt notified of her CT results showing no abscess,no infection,no metastatic disease in the abdomen or pelvis, and no underlying issues that we were concerned with the pt having the rectovaginal fistula. The pt does not want to have any surgery now b/c she know it will be a big operation to have the rectovaginal fistula repaired. The pt will call us back if she changes her mind.

## 2011-12-04 ENCOUNTER — Telehealth: Payer: Self-pay | Admitting: Cardiovascular Disease

## 2011-12-04 NOTE — Telephone Encounter (Signed)
plz return call to patient [phone] to discuss medication

## 2011-12-04 NOTE — Telephone Encounter (Signed)
Spoke with pt. She will stop Plavix to see if this helps nausea.  If not she will resume Plavix and stop Crestor.  She feels fatigue is better on lower dose of metoprolol so she will continue this. She is aware to continue aspirin.  She will let us know in a couple of weeks how she is feeling.

## 2011-12-04 NOTE — Telephone Encounter (Signed)
Spoke with pt who recently saw primary MD with complaints of fatigue and nausea.  Labs were done and per pt were OK. She states primary MD suggested she call cardiologist to see if Plavix or Crestor may be contributing to her nausea.  Pt states fatigue has improved some after decreasing lopressor to 25 mg twice daily.  States nausea has been going on for about a month. No reflux. No vomiting. Phenergan started prn by Dr. Jeannetta Nap but pt does not like to take too often as it makes her very sleepy.  Will review with Dr. Clifton James to see if cardiac meds may possibly be contributing to nausea.

## 2011-12-04 NOTE — Telephone Encounter (Signed)
OK to stop Crestor and Plavix for now. Would continue ASA. She can also stop the Lopressor if she is still fatigued. Thanks, chris

## 2012-02-23 ENCOUNTER — Telehealth: Payer: Self-pay | Admitting: Cardiovascular Disease

## 2012-02-23 NOTE — Telephone Encounter (Signed)
Called patient back. She states that she is not taking Crestor or Plavix anymore and wanted to know if she needed a Lipid panel or any other labs prior to 3/5 visit. Advised will ask Dr.McAlhany next week and call her back.

## 2012-02-23 NOTE — Telephone Encounter (Signed)
New problem:  Would like to have lab work prior to appt in march

## 2012-02-23 NOTE — Telephone Encounter (Signed)
No answer at home and no machine. Will try again.

## 2012-02-26 ENCOUNTER — Other Ambulatory Visit: Payer: Self-pay | Admitting: Cardiovascular Disease

## 2012-02-26 NOTE — Telephone Encounter (Signed)
She does not need bloodwork. Thanks, chris

## 2012-02-26 NOTE — Telephone Encounter (Signed)
Pt.notified

## 2012-03-18 ENCOUNTER — Encounter (INDEPENDENT_AMBULATORY_CARE_PROVIDER_SITE_OTHER): Payer: Self-pay | Admitting: General Surgery

## 2012-03-18 ENCOUNTER — Ambulatory Visit (INDEPENDENT_AMBULATORY_CARE_PROVIDER_SITE_OTHER): Payer: Medicare Other | Admitting: General Surgery

## 2012-03-18 VITALS — BP 138/62 | HR 67 | Temp 98.4°F | Resp 16 | Ht 64.5 in | Wt 113.0 lb

## 2012-03-18 DIAGNOSIS — N823 Fistula of vagina to large intestine: Secondary | ICD-10-CM

## 2012-03-18 DIAGNOSIS — N824 Other female intestinal-genital tract fistulae: Secondary | ICD-10-CM

## 2012-03-18 MED ORDER — CIPROFLOXACIN HCL 500 MG PO TABS: 500.0000 mg | ORAL_TABLET | Freq: Two times a day (BID) | ORAL | Status: AC

## 2012-03-18 MED ORDER — METRONIDAZOLE 500 MG PO TABS: 500.0000 mg | ORAL_TABLET | Freq: Three times a day (TID) | ORAL | Status: DC

## 2012-03-18 NOTE — Patient Instructions (Signed)
I have prescribe some antibiotics for you to take for the next 2 weeks.  I would also like to perform a flexible sigmoidoscopy to evaluate this further.

## 2012-03-18 NOTE — Progress Notes (Signed)
Chief Complaint  Patient presents with  . New Evaluation    retrovaginal fistula    HISTORY: [name] is a 79 y.o. female who presents to the office with rectal and vaginal drainage.  She is s/p TEM for a T2 rectal cancer in Oct 2012.  She developed an abscess postoperatively and a subsequent fistula.   She underwent a diverting colostomy to allow the area to heal over a year ago.     Past Medical History  Diagnosis Date  . Arthritis   . Irregular heart beat   . Hyperlipidemia   . CAD (coronary artery disease)     a. s/p INF STEMI 7/12: tx with BMS to RCA;  b. cath 08/26/10: pLAD 30%, mLAD 50%, D1 40%, pCFX 95%, mRCA occluded;   c. staged PCI of pCFX with BMS;   d. echo 7/12:   EF 60-65%, mild RAE, mild to moderate AI, mild MR, moderate TR, RVE, PASP 47  . Hearing loss   . Bilateral external ear infections   . Rectal cancer     rectal  . Rectal Cancer 08/2010    adenocarcinoma  . Myocardial infarction 08/2010  . Hypertension   . Vaginal infection November 2012    pt on antibiotics because infection really causing her to be fatigued   . 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 01/11/2011      Past Surgical History  Procedure Date  . Abdominal hysterectomy   . Tonsillectomy   . Total hip arthroplasty   . Knee surgery   . External ear surgery   . Wrist surgery   . Cardiac surgery     2 stints put in  . Partial protectomy 11/17/2010    with colostomy 10/9        Current Outpatient Prescriptions  Medication Sig Dispense Refill  . acetaminophen (TYLENOL) 325 MG tablet Take 650 mg by mouth every 6 (six) hours as needed.        . aspirin 81 MG tablet Take 81 mg by mouth daily.        . metoprolol (LOPRESSOR) 25 MG tablet Take 1 tablet (25 mg total) by mouth 2 (two) times daily.  60 tablet  6  . Multiple Vitamin (MULTIVITAMIN) tablet Take 1 tablet by mouth daily.        . NITROSTAT 0.4 MG SL tablet Place 0.4 mg under the tongue every 5 (five) minutes as needed.        . zolpidem (AMBIEN) 5 MG tablet Take 5 mg by mouth as needed.       . [DISCONTINUED] omeprazole (PRILOSEC) 40 MG capsule Take 40 mg by mouth as needed.          Allergies  Allergen Reactions  . Effexor (Venlafaxine Hydrochloride)   . Sulfa Antibiotics Nausea Only  . Hydrocodone Nausea Only      Family History  Problem Relation Age of Onset  . Kidney disease Mother   . Heart disease Father   . Cancer Brother     History   Social History  . Marital Status: Widowed    Spouse Name: N/A    Number of Children: N/A  . Years of Education: N/A   Social History Main Topics  . Smoking status: Never Smoker   . Smokeless tobacco: Never Used  . Alcohol Use: No  . Drug Use: No  . Sexually Active: None   Other Topics Concern  . None   Social History Narrative  .   None      REVIEW OF SYSTEMS - PERTINENT POSITIVES ONLY: Review of Systems - General ROS: negative for - chills, fever or weight loss Hematological and Lymphatic ROS: negative for - bleeding problems, blood clots or bruising Respiratory ROS: no cough, shortness of breath, or wheezing Cardiovascular ROS: no chest pain or dyspnea on exertion Gastrointestinal ROS: no abdominal pain, change in bowel habits, or black or bloody stools Genito-Urinary ROS: no dysuria, trouble voiding, or hematuria  EXAM: Filed Vitals:   03/18/12 1132  BP: 138/62  Pulse: 67  Temp: 98.4 F (36.9 C)  Resp: 16    General appearance: alert, cooperative and no distress Resp: clear to auscultation bilaterally Cardio: regular rate and rhythm GI: normal findings: soft, non-tender ostomy- pink and viable   Procedure: Rigid sigmoidoscopy Surgeon: Missi Mcmackin Diagnosis: recto-vaginal fistula  Assistant: Lori T After the risks and benefits were explained, verbal consent was obtained for above procedure  Anesthesia: none Findings: patient appears to have a large communication, large amount of purulence with the rectal vault, sigmoidoscope  advanced to about 10-12cm    ASSESSMENT AND PLAN: [name] is 79 y.o. F who was referred to me for a persistent recto-vaginal fistula despite diverting colostomy.  On exam she has quite a bit of purulence in her rectum.  I believe she would benefit from some antibiotics to calm down the inflammation.  I would also like to perform a flexible sigmoidoscopy to better visualize the fistula and get a biopsy.  We will schedule this in a couple weeks.  Then I will have the information I need to give my opinion on how to treat the area.     Marilynn Ekstein C Tully Mcinturff, MD Colon and Rectal Surgery / General Surgery [facility]      Visit Diagnoses: 1. Recto-vaginal fistula     Primary Care Physician: ELKINS,WILSON OLIVER, MD  

## 2012-03-25 ENCOUNTER — Telehealth (INDEPENDENT_AMBULATORY_CARE_PROVIDER_SITE_OTHER): Payer: Self-pay | Admitting: General Surgery

## 2012-03-25 NOTE — Telephone Encounter (Signed)
I notified the pt and asked her to try the imodium.  She will call back before she finishes the antibiotics if no better or worse.

## 2012-03-25 NOTE — Telephone Encounter (Signed)
Could be from antibiotics but usually people don't get diarrhea from the ones she is taking.  She can take some imodium, as long as she is not having abd pain or fevers.  If the diarrhea gets worse, she should let us know.

## 2012-03-25 NOTE — Telephone Encounter (Signed)
Patient called in stating that she has noticed her stools are very loose and have become dark. Patient stated that the area around the stoma site seems wet. Patient stated she is not sure if that is due to the antibiotics or not. But concern about the area. Patient wanted to know if she should continue taking the antibiotics. I advised the patient to continue taking them as directed unless Dr. Maisie Fus says otherwise. I told the patient her concern would be forwarded to Dr. Maisie Fus and Huntley Dec for call back. Patient agreed.

## 2012-04-02 ENCOUNTER — Ambulatory Visit (HOSPITAL_COMMUNITY)
Admission: RE | Admit: 2012-04-02 | Discharge: 2012-04-02 | Disposition: A | Discharge status: Home / Self Care | Payer: Medicare Other | Source: Ambulatory Visit | Attending: General Surgery | Admitting: General Surgery

## 2012-04-02 ENCOUNTER — Encounter (HOSPITAL_COMMUNITY)
Admission: RE | Disposition: A | Discharge status: Home / Self Care | Payer: Self-pay | Source: Ambulatory Visit | Attending: General Surgery

## 2012-04-02 ENCOUNTER — Encounter (HOSPITAL_COMMUNITY): Payer: Self-pay

## 2012-04-02 DIAGNOSIS — E785 Hyperlipidemia, unspecified: Secondary | ICD-10-CM | POA: Insufficient documentation

## 2012-04-02 DIAGNOSIS — Z79899 Other long term (current) drug therapy: Secondary | ICD-10-CM | POA: Insufficient documentation

## 2012-04-02 DIAGNOSIS — I10 Essential (primary) hypertension: Secondary | ICD-10-CM | POA: Insufficient documentation

## 2012-04-02 DIAGNOSIS — K603 Anal fistula, unspecified: Secondary | ICD-10-CM | POA: Insufficient documentation

## 2012-04-02 DIAGNOSIS — Z85048 Personal history of other malignant neoplasm of rectum, rectosigmoid junction, and anus: Secondary | ICD-10-CM

## 2012-04-02 DIAGNOSIS — N824 Other female intestinal-genital tract fistulae: Secondary | ICD-10-CM | POA: Insufficient documentation

## 2012-04-02 DIAGNOSIS — Z90710 Acquired absence of both cervix and uterus: Secondary | ICD-10-CM | POA: Insufficient documentation

## 2012-04-02 DIAGNOSIS — I252 Old myocardial infarction: Secondary | ICD-10-CM | POA: Insufficient documentation

## 2012-04-02 DIAGNOSIS — K626 Ulcer of anus and rectum: Secondary | ICD-10-CM | POA: Insufficient documentation

## 2012-04-02 DIAGNOSIS — I2510 Atherosclerotic heart disease of native coronary artery without angina pectoris: Secondary | ICD-10-CM | POA: Insufficient documentation

## 2012-04-02 HISTORY — PX: FLEXIBLE SIGMOIDOSCOPY: SHX5431

## 2012-04-02 SURGERY — SIGMOIDOSCOPY, FLEXIBLE
Anesthesia: Moderate Sedation

## 2012-04-02 MED ORDER — FENTANYL CITRATE 0.05 MG/ML IJ SOLN
INTRAMUSCULAR | Status: AC
  Filled 2012-04-02: qty 4

## 2012-04-02 MED ORDER — FENTANYL CITRATE 0.05 MG/ML IJ SOLN
INTRAMUSCULAR | Status: DC | PRN
  Administered 2012-04-02: 25 ug via INTRAVENOUS

## 2012-04-02 MED ORDER — MIDAZOLAM HCL 10 MG/2ML IJ SOLN
INTRAMUSCULAR | Status: AC
  Filled 2012-04-02: qty 2

## 2012-04-02 MED ORDER — SODIUM CHLORIDE 0.9 % IV SOLN
INTRAVENOUS | Status: DC
  Administered 2012-04-02: 500 mL via INTRAVENOUS

## 2012-04-02 NOTE — H&P (View-Only) (Signed)
Chief Complaint  Patient presents with  . New Evaluation    retrovaginal fistula    HISTORY: [name] is a 79 y.o. female who presents to the office with rectal and vaginal drainage.  She is s/p TEM for a T2 rectal cancer in Oct 2012.  She developed an abscess postoperatively and a subsequent fistula.   She underwent a diverting colostomy to allow the area to heal over a year ago.     Past Medical History  Diagnosis Date  . Arthritis   . Irregular heart beat   . Hyperlipidemia   . CAD (coronary artery disease)     a. s/p INF STEMI 7/12: tx with BMS to RCA;  b. cath 08/26/10: pLAD 30%, mLAD 50%, D1 40%, pCFX 95%, mRCA occluded;   c. staged PCI of pCFX with BMS;   d. echo 7/12:   EF 60-65%, mild RAE, mild to moderate AI, mild MR, moderate TR, RVE, PASP 47  . Hearing loss   . Bilateral external ear infections   . Rectal cancer     rectal  . Rectal Cancer 08/2010    adenocarcinoma  . Myocardial infarction 08/2010  . Hypertension   . Vaginal infection November 2012    pt on antibiotics because infection really causing her to be fatigued   . 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 01/11/2011      Past Surgical History  Procedure Date  . Abdominal hysterectomy   . Tonsillectomy   . Total hip arthroplasty   . Knee surgery   . External ear surgery   . Wrist surgery   . Cardiac surgery     2 stints put in  . Partial protectomy 11/17/2010    with colostomy 10/9        Current Outpatient Prescriptions  Medication Sig Dispense Refill  . acetaminophen (TYLENOL) 325 MG tablet Take 650 mg by mouth every 6 (six) hours as needed.        Marland Kitchen aspirin 81 MG tablet Take 81 mg by mouth daily.        . metoprolol (LOPRESSOR) 25 MG tablet Take 1 tablet (25 mg total) by mouth 2 (two) times daily.  60 tablet  6  . Multiple Vitamin (MULTIVITAMIN) tablet Take 1 tablet by mouth daily.        Marland Kitchen NITROSTAT 0.4 MG SL tablet Place 0.4 mg under the tongue every 5 (five) minutes as needed.        . zolpidem (AMBIEN) 5 MG tablet Take 5 mg by mouth as needed.       . [DISCONTINUED] omeprazole (PRILOSEC) 40 MG capsule Take 40 mg by mouth as needed.          Allergies  Allergen Reactions  . Effexor (Venlafaxine Hydrochloride)   . Sulfa Antibiotics Nausea Only  . Hydrocodone Nausea Only      Family History  Problem Relation Age of Onset  . Kidney disease Mother   . Heart disease Father   . Cancer Brother     History   Social History  . Marital Status: Widowed    Spouse Name: N/A    Number of Children: N/A  . Years of Education: N/A   Social History Main Topics  . Smoking status: Never Smoker   . Smokeless tobacco: Never Used  . Alcohol Use: No  . Drug Use: No  . Sexually Active: None   Other Topics Concern  . None   Social History Narrative  .  None      REVIEW OF SYSTEMS - PERTINENT POSITIVES ONLY: Review of Systems - General ROS: negative for - chills, fever or weight loss Hematological and Lymphatic ROS: negative for - bleeding problems, blood clots or bruising Respiratory ROS: no cough, shortness of breath, or wheezing Cardiovascular ROS: no chest pain or dyspnea on exertion Gastrointestinal ROS: no abdominal pain, change in bowel habits, or black or bloody stools Genito-Urinary ROS: no dysuria, trouble voiding, or hematuria  EXAM: Filed Vitals:   03/18/12 1132  BP: 138/62  Pulse: 67  Temp: 98.4 F (36.9 C)  Resp: 16    General appearance: alert, cooperative and no distress Resp: clear to auscultation bilaterally Cardio: regular rate and rhythm GI: normal findings: soft, non-tender ostomy- pink and viable   Procedure: Rigid sigmoidoscopy Surgeon: [name] Diagnosis: recto-vaginal fistula  Assistant: [name] T After the risks and benefits were explained, verbal consent was obtained for above procedure  Anesthesia: none Findings: patient appears to have a large communication, large amount of purulence with the rectal vault, sigmoidoscope  advanced to about 10-12cm    ASSESSMENT AND PLAN: [name] is 79 y.o. F who was referred to me for a persistent recto-vaginal fistula despite diverting colostomy.  On exam she has quite a bit of purulence in her rectum.  I believe she would benefit from some antibiotics to calm down the inflammation.  I would also like to perform a flexible sigmoidoscopy to better visualize the fistula and get a biopsy.  We will schedule this in a couple weeks.  Then I will have the information I need to give my opinion on how to treat the area.     Vanita Panda, MD Colon and Rectal Surgery / General Surgery [facility]      Visit Diagnoses: 1. Recto-vaginal fistula     Primary Care Physician: Kaleen Mask, MD

## 2012-04-02 NOTE — Interval H&P Note (Signed)
History and Physical Interval Note:  04/02/2012 9:45 AM  [name]  has presented today for surgery, with the diagnosis of recto-vaginal fistula  The various methods of treatment have been discussed with the patient and family. After consideration of risks, benefits and other options for treatment, the patient has consented to  Procedure(s): FLEXIBLE SIGMOIDOSCOPY (N/A) as a surgical intervention .  The patient's history has been reviewed, patient examined, no change in status, stable for surgery.  I have reviewed the patient's chart and labs.  Questions were answered to the patient's satisfaction.     Vanita Panda, MD  Colorectal and General Surgery [facility] Surgery

## 2012-04-02 NOTE — Op Note (Signed)
[facility] [address] [place], [address]   FLEXIBLE SIGMOIDOSCOPY PROCEDURE REPORT  PATIENT: Joyce, Hamilton.  MR#: [id number] BIRTHDATE: 05/16/1916 , 89  yrs. old GENDER: Female ENDOSCOPIST: Vanita Panda, MD REFERRED BY: Karie Soda, M.D. PROCEDURE DATE:  04/02/2012 PROCEDURE:   Proctosigmoidoscopy with biopsy ASA CLASS:   Class III INDICATIONS:high risk patient with personal history of rectal cancer. MEDICATIONS: Fentanyl 25 mcg IV  DESCRIPTION OF PROCEDURE:   After the risks benefits and alternatives of the procedure were thoroughly explained, informed consent was obtained.  revealed no abnormalities of the rectum. The [id number]  endoscope was introduced through the anus  and advanced to the sigmoid colon , limited by No adverse events experienced.   The quality of the prep was excellent .  The instrument was then slowly withdrawn as the mucosa was fully examined.  The fistula was identified at the top of the anal canal.  There was no associated inflammation.  There was a small polypoid mass near the fistula opening.  Biopsy was obtained of this mass and the overlying fistula mucosa.       1.  COLON FINDINGS: The colonic mucosa appeared normal. 2.  Small fistula was found in the anal canal.  A biopsy was performed using cold forceps. Retroflexion was not performed due to a narrow rectal vault. The scope was then withdrawn from the patient and the procedure terminated.  COMPLICATIONS: There were no complications.  ENDOSCOPIC IMPRESSION: 1.   The colonic mucosa appeared normal 2.   Small fistula in the anal canal; biopsy was performed using cold forceps  RECOMMENDATIONS: Return to office for discussion on continued treatment   REPEAT EXAM: for No recall due to age.. standard discharge  _______________________________ eSigned:  Vanita Panda, MD 04/02/2012 9:18 AM   HY:[id number] Michaell Cowing, MD

## 2012-04-03 ENCOUNTER — Encounter (HOSPITAL_COMMUNITY): Payer: Self-pay | Admitting: General Surgery

## 2012-04-17 ENCOUNTER — Ambulatory Visit (INDEPENDENT_AMBULATORY_CARE_PROVIDER_SITE_OTHER): Payer: Medicare Other | Admitting: Cardiovascular Disease

## 2012-04-17 ENCOUNTER — Encounter: Payer: Self-pay | Admitting: Cardiovascular Disease

## 2012-04-17 VITALS — BP 120/70 | HR 62 | Ht 64.0 in | Wt 114.0 lb

## 2012-04-17 DIAGNOSIS — I491 Atrial premature depolarization: Secondary | ICD-10-CM

## 2012-04-17 NOTE — Patient Instructions (Addendum)
Your physician wants you to follow-up in:  6 months. You will receive a reminder letter in the mail two months in advance. If you don't receive a letter, please call our office to schedule the follow-up appointment.   

## 2012-04-17 NOTE — Progress Notes (Signed)
History of Present Illness: 79 yo WF with a history of CAD, moderate AI, mild MR, rectal cancer, PAF here today for cardiac follow up. She was admitted 08/2010 with an inferior STEMI. Emergent cath done with placement of a BMS to the RCA for 100% occlusion. She was noted to have a high grade proximal CFX 95%. She was brought back to the cath lab a couple days later for staged PCI. She was treated with BMS to the CFX. Echo 08/27/11: EF 60-65%, mild RAE, mild to moderate AI, mild MR, moderate TR, RVE, PASP 47. She has undergone partial proctectomy with loop colostomy in 11/2010. She developed an abscess with fistula. She had issues with persistent rectovaginal fistula. I saw her 10/02/11 and she had c/o fatigue, dyspnea and palpitations. I arranged an echo which showed normal LV size and function with mild to moderate AI, moderate TR. 48 hour holter monitor showed NSR with PACs and runs of SVT with rare PVCs. No VT or atrial fibrillation noted. I increased her metoprolol.   She is here today for follow up. She has not felt palpitations. No chest pain but she feels tired. She still has drainage from her fistula. She has been trying to keep her po intake up. Her BP at home is 120-130 systolically.   Primary Care Physician: Windle Guard   Last Lipid Profile:Lipid Panel     Component Value Date/Time   CHOL 129 07/31/2011 1010   TRIG 88.0 07/31/2011 1010   HDL 74.20 07/31/2011 1010   CHOLHDL 2 07/31/2011 1010   VLDL 17.6 07/31/2011 1010   LDLCALC 37 07/31/2011 1010     Past Medical History  Diagnosis Date  . Arthritis   . Irregular heart beat   . Hyperlipidemia   . CAD (coronary artery disease)     a. s/p INF STEMI 7/12: tx with BMS to RCA;  b. cath 08/26/10: pLAD 30%, mLAD 50%, D1 40%, pCFX 95%, mRCA occluded;   c. staged PCI of pCFX with BMS;   d. echo 7/12:   EF 60-65%, mild RAE, mild to moderate AI, mild MR, moderate TR, RVE, PASP 47  . Hearing loss   . Bilateral external ear infections   . Rectal  cancer     rectal  . Rectal Cancer 08/2010    adenocarcinoma  . Myocardial infarction 08/2010  . Hypertension   . Vaginal infection November 2012    pt on antibiotics because infection really causing her to be fatigued   . 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 01/11/2011    Past Surgical History  Procedure Laterality Date  . Abdominal hysterectomy    . Tonsillectomy    . Total hip arthroplasty    . Knee surgery    . External ear surgery    . Wrist surgery    . Cardiac surgery      2 stints put in  . Partial protectomy  11/17/2010    with colostomy 10/9  . Cardiac catheterization    . Flexible sigmoidoscopy N/A 04/02/2012    Procedure: FLEXIBLE SIGMOIDOSCOPY;  Surgeon: Romie Levee, MD;  Location: [facility] END[facility]COPY;  Service: Endoscopy;  Laterality: N/A;    Current Outpatient Prescriptions  Medication Sig Dispense Refill  . acetaminophen (TYLENOL) 325 MG tablet Take 650 mg by mouth every 6 (six) hours as needed.        Marland Kitchen aspirin 81 MG tablet Take 81 mg by mouth daily.        . metoprolol (  LOPRESSOR) 25 MG tablet Take 1 tablet (25 mg total) by mouth 2 (two) times daily.  60 tablet  6  . metroNIDAZOLE (FLAGYL) 500 MG tablet Take 1 tablet (500 mg total) by mouth 3 (three) times daily.  42 tablet  0  . Multiple Vitamin (MULTIVITAMIN) tablet Take 1 tablet by mouth daily.        Marland Kitchen NITROSTAT 0.4 MG SL tablet Place 0.4 mg under the tongue every 5 (five) minutes as needed.       . zolpidem (AMBIEN) 5 MG tablet Take 5 mg by mouth as needed.       . [DISCONTINUED] omeprazole (PRILOSEC) 40 MG capsule Take 40 mg by mouth as needed.       No current facility-administered medications for this visit.    Allergies  Allergen Reactions  . Effexor (Venlafaxine Hydrochloride)   . Sulfa Antibiotics Nausea Only  . Hydrocodone Nausea Only    History   Social History  . Marital Status: Widowed    Spouse Name: N/A    Number of Children: N/A  . Years of Education: N/A    Occupational History  . Not on file.   Social History Main Topics  . Smoking status: Never Smoker   . Smokeless tobacco: Never Used  . Alcohol Use: No  . Drug Use: No  . Sexually Active: Not on file   Other Topics Concern  . Not on file   Social History Narrative  . No narrative on file    Family History  Problem Relation Age of Onset  . Kidney disease Mother   . Heart disease Father   . Cancer Brother     Review of Systems:  As stated in the HPI and otherwise negative.   BP 120/70  Pulse 80  Ht 5\' 4"  (1.626 m)  Wt 114 lb (51.71 kg)  BMI 19.56 kg/m2  Physical Examination: General: Thin, elderly female in NAD HEENT: OP clear, mucus membranes moist SKIN: warm, dry. No rashes. Neuro: No focal deficits Musculoskeletal: Muscle strength 5/5 all ext Psychiatric: Mood and affect normal Neck: No JVD, no carotid bruits, no thyromegaly, no lymphadenopathy. Lungs:Clear bilaterally, no wheezes, rhonci, crackles Cardiovascular: Regular rate and rhythm. Systolic murmur noted. No gallops or rubs. Abdomen:Soft. Bowel sounds present. Non-tender.  Extremities: No lower extremity edema. Pulses are 2 + in the bilateral DP/PT.  EKG: Sinus, rate 62 bpm. 1st degree AV block. PAC. Poor R wave progression.   Assessment and Plan:   1. CAD: Stable. No chest pains. Lipids are at goal. She is not on a statin any longer given age.  LV Function has been normal.    2. Dyspnea: Likely multi-factorial with age and with fluid loss related to chronic draining rectovaginal fistula. Will encourage po intake.   3. Palpitations: Improved on higher dose of metoprolol. Likely due to SVT and PACs.

## 2012-04-19 ENCOUNTER — Encounter (INDEPENDENT_AMBULATORY_CARE_PROVIDER_SITE_OTHER): Payer: Medicare Other | Admitting: General Surgery

## 2012-04-30 ENCOUNTER — Encounter (INDEPENDENT_AMBULATORY_CARE_PROVIDER_SITE_OTHER): Payer: Medicare Other | Admitting: General Surgery

## 2012-05-08 ENCOUNTER — Encounter (INDEPENDENT_AMBULATORY_CARE_PROVIDER_SITE_OTHER): Payer: Self-pay | Admitting: General Surgery

## 2012-05-08 ENCOUNTER — Ambulatory Visit (INDEPENDENT_AMBULATORY_CARE_PROVIDER_SITE_OTHER): Payer: Medicare Other | Admitting: General Surgery

## 2012-05-08 VITALS — BP 114/105 | HR 72 | Temp 98.3°F | Resp 14 | Ht 64.5 in | Wt 116.0 lb

## 2012-05-08 DIAGNOSIS — N823 Fistula of vagina to large intestine: Secondary | ICD-10-CM

## 2012-05-08 DIAGNOSIS — N824 Other female intestinal-genital tract fistulae: Secondary | ICD-10-CM

## 2012-05-08 NOTE — Progress Notes (Signed)
[name] is a 79 y.o. female who is here for a follow up visit regarding her rectovaginal fistula.  This occurred after a post-TEM abscess.  She is currently diverted, but the area has failed to heal. She does not have a history of radiation treatment.  Flex sig with biopsy showed no signs of recurrence. Her drainage has decreased quite a bit over the last few days.  Objective: Filed Vitals:   05/08/12 1623  BP: 114/105  Pulse: 72  Temp: 98.3 F (36.8 C)  Resp: 14  Recheck BP was 144/94  General appearance: alert and cooperative ABD: soft  Assessment and Plan: [name] is a 79 y.o. F with a rectovaginal fistula.  I believe that a fistula plug may be able to help her heal the defect.  We discussed other treatment options, including doing nothing and MAF.  She will think about this and call the office if she decides to proceed with surgery or has any other questions.    Vanita Panda, MD [facility] Surgery, [place] [phone]

## 2012-05-08 NOTE — Patient Instructions (Signed)
Call the office if you have any questions or would like to schedule surgery.

## 2012-05-22 ENCOUNTER — Telehealth (INDEPENDENT_AMBULATORY_CARE_PROVIDER_SITE_OTHER): Payer: Self-pay

## 2012-05-22 NOTE — Telephone Encounter (Signed)
I returned the patient's call.  She has a few questions.  She now has yellow and pink drainage from the rectum for the past two weeks.  She wants to know if that is what is making her feel bad, and weak?  She has no fever.  I told her it could make her feel bad.  She wants to know what days Dr Maisie Fus does surgery and can she have it done if she is draining?  She is trying to arrange it when her daughter can be here.

## 2012-05-22 NOTE — Telephone Encounter (Signed)
Patient called asking to speak to [facility]. I asked if I could help her with something since [name] was unavailable at the time. She said she wanted me to send [name] a message and have her call her later.

## 2012-05-23 NOTE — Telephone Encounter (Signed)
I left a message to have the pt call me so I can give her Dr Maisie Fus' message.

## 2012-05-23 NOTE — Telephone Encounter (Signed)
We can do the surgery whenever. It would be ideal if it were not draining, but since she is diverted, I think we could probably just put her on a round of antibiotics leading up to the surgery. AT

## 2012-05-23 NOTE — Telephone Encounter (Signed)
The pt called me back.  I let her know what Dr Maisie Fus said.  She wants to talk to her daughter this weekend and will call back next week if she wants to go ahead and get scheduled and if she wants antibiotics called in.

## 2012-05-28 ENCOUNTER — Telehealth (INDEPENDENT_AMBULATORY_CARE_PROVIDER_SITE_OTHER): Payer: Self-pay

## 2012-05-28 DIAGNOSIS — N823 Fistula of vagina to large intestine: Secondary | ICD-10-CM

## 2012-05-28 MED ORDER — AMOXICILLIN-POT CLAVULANATE 875-125 MG PO TABS: 1.0000 | ORAL_TABLET | Freq: Two times a day (BID) | ORAL | Status: AC

## 2012-05-28 NOTE — Telephone Encounter (Signed)
That's fine with me.  Rx called into pharmacy.  She should start this 2 weeks prior to surgery scheduled date.     Surgeon: Vanita Panda  Patient: [name]   DOB: Aug 20, 1916   MRN: [id number]  Location: [facility]  Admit status: outpatient  Procedure: insertion of fistula plug  Diagnosis:rectovaginal fistula  Size of lesion:    SQ?   Subfascial?   Position:supine  Anesthesia:MAC combined with regional for post-op pain  Assistant?:No   Estimated time: 1 hr  May patient be same day labs: Yes   Mark any special requests: NL/SM?:                 Films at: NeoProbe?:   C-Arm?:  Mesh?: Harmonic Scalpel?: Yellow Fin Stirrups?: CC Clearance?: Co-ordinate case?:    Miscellaneous Notes: fistula plug  Lun Muro C. 05/28/2012 12:29 PM

## 2012-05-28 NOTE — Telephone Encounter (Signed)
I called and let the pt know we sent the orders to our schedulers.  She should get a call in a day or so.  I told her the abx were sent to her pharmacy and she should take those 2 weeks preop.

## 2012-05-28 NOTE — Addendum Note (Signed)
Addended by: Vanita Panda on: 05/28/2012 12:48 PM   Modules accepted: Orders

## 2012-05-28 NOTE — Telephone Encounter (Signed)
Patient had called me yesterday and I returned her call.  She is interested in scheduling surgery for 06/21/12.  That is when her daughter can be there and it's a good day for her too.  It is after her birthday.  She wants to know if it can be that day.  She also wants her antibiotics sent in to her pharmacy [facility] on [address].

## 2012-06-13 ENCOUNTER — Telehealth (INDEPENDENT_AMBULATORY_CARE_PROVIDER_SITE_OTHER): Payer: Self-pay | Admitting: *Deleted

## 2012-06-13 NOTE — Telephone Encounter (Signed)
Patient called to state around she is having some skin breakdown around her stoma site.  Patient encouraged to try a skin barrier cream to the area to allow the skin to heal.  Patient also encouraged to change the waiffer and bag often (especially the bag so it isn't filling up).  Patient states understanding and will call back if she continues to have issues.

## 2012-06-13 NOTE — Telephone Encounter (Signed)
Pt called again concerned about some oozing of blood around the stoma.  I explained that the skin is probably so irritated that it is bleeding.  I went over previous instructions given to her by our triage RN, and told her she could call over the weekend if she had any more problems or questions.

## 2012-06-14 ENCOUNTER — Telehealth (INDEPENDENT_AMBULATORY_CARE_PROVIDER_SITE_OTHER): Payer: Self-pay

## 2012-06-14 ENCOUNTER — Telehealth (INDEPENDENT_AMBULATORY_CARE_PROVIDER_SITE_OTHER): Payer: Self-pay | Admitting: General Surgery

## 2012-06-14 NOTE — Telephone Encounter (Signed)
Patient concerned about rash around her stoma; Advised her to apply skin barrier to the area ,keep area clean and dry. She also wanted to know when the Anti - botic amoxicillin  Would make her feel better. She denies having a fever. I expressed she needed to complete her  ABT . And if she was not feeling any better to call.

## 2012-06-14 NOTE — Telephone Encounter (Signed)
Pt calling with problems attaching wafer and colostomy bag.  She states the stoma is extremely irritated and becoming excoriated.  Blood is noted, though she cannot tell if it is from the stoma or in her stool.  Pt is having a surgical procedure in another week and is on Amoxicillin for 2 weeks which is possibly causing increased diarrhea-like stools.  Suggested pt call Tristan Schroeder, at [facility], to discuss the problems with her appliance for her recommendations.  Pt is familiar with her and will try this.

## 2012-06-15 ENCOUNTER — Telehealth (INDEPENDENT_AMBULATORY_CARE_PROVIDER_SITE_OTHER): Payer: Self-pay | Admitting: General Surgery

## 2012-06-15 NOTE — Telephone Encounter (Signed)
She called c/o some bleeding around her ostomy as well as some blood in her ostomy and stool.  She has had some loose stools and says that the skin is excoriated and red.  I recommended that she come to the ER for assessment of the bleeding and to check her labs but she did not want to come in tonight.  I explained that I could not assess her wound over the phone and offered to see her in the ER.  She said that she would likely come to the ER tomorrow but did "not want to wake anyone up tonight to take her".   She did not describe a lot of bleeding and she said that she was otherwise feeling fine.  It sounds like she has had some skin irritation from leakage and that has resulted in the bleeding and skin irritation.

## 2012-06-15 NOTE — Telephone Encounter (Signed)
Called pt to discuss ostomy bleeding.  It sounds like she is bleeding from around her ostomy, but she is also noting some blood coming through her ostomy.  She denies passing blood clots, dizziness, sob, cp.  I have asked her to stop her aspirin and her augmentin.  I think her antibiotics may be irritating her gi tract.  It sounds like she got a good seal on her pouch this morning, so hopefully that will help the skin bleeding.

## 2012-06-17 ENCOUNTER — Telehealth (INDEPENDENT_AMBULATORY_CARE_PROVIDER_SITE_OTHER): Payer: Self-pay

## 2012-06-17 NOTE — Telephone Encounter (Signed)
I called the pt and answered her questions.  

## 2012-06-17 NOTE — Telephone Encounter (Signed)
Message copied by Ivory Broad on Mon Jun 17, 2012  4:30 PM ------      Message from: Marnette Burgess      Created: Mon Jun 17, 2012  3:17 PM      Contact: [phone]       Would like someone to call her on recovery period after sx, please call.  (pt of Dr. AT) ------

## 2012-06-17 NOTE — Telephone Encounter (Signed)
Patient ask should she start her ABT again. It was stopped on Saturday. I advised her to not start it back at this time it may be causing problems with her stoma according to DR. Thomas documentation. Advised her to call if her condition changes or any questions

## 2012-06-19 ENCOUNTER — Encounter (HOSPITAL_BASED_OUTPATIENT_CLINIC_OR_DEPARTMENT_OTHER): Payer: Self-pay | Admitting: *Deleted

## 2012-06-19 NOTE — Anesthesia Preprocedure Evaluation (Addendum)
Anesthesia Evaluation  Patient identified by MRN, date of birth, ID band Patient awake    Reviewed: Allergy & Precautions, H&P , NPO status , Patient's Chart, lab work & pertinent test results  History of Anesthesia Complications (+) PONV and Emergence Delirium  Airway Mallampati: II TM Distance: >3 FB Neck ROM: Full    Dental  (+) Teeth Intact, Poor Dentition and Chipped,    Pulmonary neg pulmonary ROS, shortness of breath,  breath sounds clear to auscultation        Cardiovascular hypertension, Pt. on medications and Pt. on home beta blockers + CAD, + Past MI and + Cardiac Stents + dysrhythmias Atrial Fibrillation and Supra Ventricular Tachycardia Rhythm:Regular Rate:Normal     Neuro/Psych negative neurological ROS  negative psych ROS   GI/Hepatic negative GI ROS, Neg liver ROS, Neoplasm rectum   Endo/Other  negative endocrine ROS  Renal/GU negative Renal ROS  negative genitourinary   Musculoskeletal negative musculoskeletal ROS (+)   Abdominal   Peds  Hematology negative hematology ROS (+)   Anesthesia Other Findings   Reproductive/Obstetrics                          Anesthesia Physical Anesthesia Plan  ASA: III  Anesthesia Plan: MAC   Post-op Pain Management:    Induction: Intravenous  Airway Management Planned: Simple Face Mask  Additional Equipment:   Intra-op Plan:   Post-operative Plan:   Informed Consent: I have reviewed the patients History and Physical, chart, labs and discussed the procedure including the risks, benefits and alternatives for the proposed anesthesia with the patient or authorized representative who has indicated his/her understanding and acceptance.   Dental advisory given  Plan Discussed with: CRNA  Anesthesia Plan Comments:         Anesthesia Quick Evaluation

## 2012-06-19 NOTE — Progress Notes (Signed)
NPO AFTER MN. ARRIVES AT 0700. PRE-OP ORDERS PENDING. NEEDS ISTAT. CURRENT EKG IN EPIC AND CHART. WILL TAKE METOPROLOL AM OF SURG W/ SIP OF WATER.

## 2012-06-20 ENCOUNTER — Telehealth (INDEPENDENT_AMBULATORY_CARE_PROVIDER_SITE_OTHER): Payer: Self-pay

## 2012-06-20 NOTE — Telephone Encounter (Signed)
I called the pt back.  She just doesn't feel well today.  But she says she did a lot of running around today.  She has no fever.  She has no bleeding but had a pink tinge of drainage from her fistula.  I told her it's hard to say what's going on but she can go to surgery and they will check her over in the am.  They may be able to do blood work.  I also told her she can cancel if she feels uncomfortable going to surgery.  She will plan to go in as scheduled and go from there.

## 2012-06-20 NOTE — Telephone Encounter (Signed)
Pt called today and says she "just doesn't feel well".  Her fistula is draining.  She has no fever or chills, but is c/o upset stomach.  I asked her if she thought she may be nervous about her surgery tomorrow.  Pt may need additional reassurance.

## 2012-06-21 ENCOUNTER — Encounter (HOSPITAL_BASED_OUTPATIENT_CLINIC_OR_DEPARTMENT_OTHER): Payer: Self-pay | Admitting: Anesthesiology

## 2012-06-21 ENCOUNTER — Encounter (HOSPITAL_BASED_OUTPATIENT_CLINIC_OR_DEPARTMENT_OTHER)
Admission: RE | Disposition: A | Discharge status: Home / Self Care | Payer: Self-pay | Source: Ambulatory Visit | Attending: General Surgery

## 2012-06-21 ENCOUNTER — Encounter (HOSPITAL_BASED_OUTPATIENT_CLINIC_OR_DEPARTMENT_OTHER): Payer: Self-pay | Admitting: *Deleted

## 2012-06-21 ENCOUNTER — Ambulatory Visit (HOSPITAL_BASED_OUTPATIENT_CLINIC_OR_DEPARTMENT_OTHER)
Admission: RE | Admit: 2012-06-21 | Discharge: 2012-06-21 | Disposition: A | Discharge status: Home / Self Care | Payer: Medicare Other | Source: Ambulatory Visit | Attending: General Surgery | Admitting: General Surgery

## 2012-06-21 ENCOUNTER — Ambulatory Visit (HOSPITAL_BASED_OUTPATIENT_CLINIC_OR_DEPARTMENT_OTHER): Payer: Medicare Other | Admitting: Anesthesiology

## 2012-06-21 DIAGNOSIS — E785 Hyperlipidemia, unspecified: Secondary | ICD-10-CM | POA: Insufficient documentation

## 2012-06-21 DIAGNOSIS — Z79899 Other long term (current) drug therapy: Secondary | ICD-10-CM | POA: Insufficient documentation

## 2012-06-21 DIAGNOSIS — I10 Essential (primary) hypertension: Secondary | ICD-10-CM | POA: Insufficient documentation

## 2012-06-21 DIAGNOSIS — I252 Old myocardial infarction: Secondary | ICD-10-CM | POA: Insufficient documentation

## 2012-06-21 DIAGNOSIS — C20 Malignant neoplasm of rectum: Secondary | ICD-10-CM | POA: Insufficient documentation

## 2012-06-21 DIAGNOSIS — I2510 Atherosclerotic heart disease of native coronary artery without angina pectoris: Secondary | ICD-10-CM | POA: Insufficient documentation

## 2012-06-21 DIAGNOSIS — N824 Other female intestinal-genital tract fistulae: Secondary | ICD-10-CM

## 2012-06-21 HISTORY — DX: Paroxysmal atrial fibrillation: I48.0

## 2012-06-21 HISTORY — DX: Other complications of anesthesia, initial encounter: T88.59XA

## 2012-06-21 HISTORY — DX: Other specified postprocedural states: Z98.890

## 2012-06-21 HISTORY — DX: Atrioventricular block, first degree: I44.0

## 2012-06-21 HISTORY — DX: Presence of coronary angioplasty implant and graft: Z95.5

## 2012-06-21 HISTORY — PX: FISTULA PLUG: SHX5831

## 2012-06-21 HISTORY — DX: Old myocardial infarction: I25.2

## 2012-06-21 HISTORY — DX: Colostomy status: Z93.3

## 2012-06-21 HISTORY — DX: Unspecified hearing loss, unspecified ear: H91.90

## 2012-06-21 HISTORY — DX: Palpitations: R00.2

## 2012-06-21 HISTORY — DX: Other nonspecific abnormal finding of lung field: R91.8

## 2012-06-21 HISTORY — DX: Other specified disorders of bone density and structure, unspecified site: M85.80

## 2012-06-21 HISTORY — DX: Reserved for inherently not codable concepts without codable children: IMO0001

## 2012-06-21 HISTORY — DX: Nausea with vomiting, unspecified: R11.2

## 2012-06-21 HISTORY — DX: Dyspnea, unspecified: R06.00

## 2012-06-21 HISTORY — DX: Adverse effect of unspecified anesthetic, initial encounter: T41.45XA

## 2012-06-21 LAB — POCT I-STAT 4, (NA,K, GLUC, HGB,HCT): Glucose, Bld: 88 mg/dL (ref 70–99)

## 2012-06-21 SURGERY — CLOSURE, FISTULA, ANUS, USING PLUG
Anesthesia: Monitor Anesthesia Care | Site: Rectum | Wound class: Clean Contaminated

## 2012-06-21 MED ORDER — BUPIVACAINE-EPINEPHRINE 0.5% -1:200000 IJ SOLN
INTRAMUSCULAR | Status: DC | PRN
  Administered 2012-06-21: 29 mL

## 2012-06-21 MED ORDER — LIDOCAINE 5 % EX OINT: TOPICAL_OINTMENT | CUTANEOUS | Status: DC | PRN

## 2012-06-21 MED ORDER — SODIUM CHLORIDE 0.9 % IV SOLN
250.0000 mL | INTRAVENOUS | Status: DC | PRN
  Filled 2012-06-21: qty 250

## 2012-06-21 MED ORDER — PROPOFOL 10 MG/ML IV BOLUS
INTRAVENOUS | Status: DC | PRN
  Administered 2012-06-21: 10 mg via INTRAVENOUS

## 2012-06-21 MED ORDER — PROPOFOL 10 MG/ML IV EMUL
INTRAVENOUS | Status: DC | PRN
  Administered 2012-06-21: 75 ug/kg/min via INTRAVENOUS

## 2012-06-21 MED ORDER — LIDOCAINE 5 % EX OINT
TOPICAL_OINTMENT | CUTANEOUS | Status: DC | PRN
  Administered 2012-06-21: 1

## 2012-06-21 MED ORDER — ACETAMINOPHEN 650 MG RE SUPP
650.0000 mg | RECTAL | Status: DC | PRN
  Filled 2012-06-21: qty 1

## 2012-06-21 MED ORDER — SODIUM CHLORIDE 0.9 % IJ SOLN
3.0000 mL | INTRAMUSCULAR | Status: DC | PRN
  Filled 2012-06-21: qty 3

## 2012-06-21 MED ORDER — LACTATED RINGERS IV SOLN
INTRAVENOUS | Status: DC
  Filled 2012-06-21: qty 1000

## 2012-06-21 MED ORDER — MORPHINE SULFATE 2 MG/ML IJ SOLN
1.0000 mg | INTRAMUSCULAR | Status: DC | PRN
  Filled 2012-06-21: qty 1

## 2012-06-21 MED ORDER — ONDANSETRON HCL 4 MG/2ML IJ SOLN
4.0000 mg | Freq: Four times a day (QID) | INTRAMUSCULAR | Status: DC | PRN
  Filled 2012-06-21: qty 2

## 2012-06-21 MED ORDER — SODIUM CHLORIDE 0.9 % IJ SOLN
3.0000 mL | Freq: Two times a day (BID) | INTRAMUSCULAR | Status: DC
  Filled 2012-06-21: qty 3

## 2012-06-21 MED ORDER — ONDANSETRON HCL 4 MG/2ML IJ SOLN
INTRAMUSCULAR | Status: DC | PRN
  Administered 2012-06-21: 4 mg via INTRAVENOUS

## 2012-06-21 MED ORDER — LACTATED RINGERS IV SOLN
INTRAVENOUS | Status: DC
  Administered 2012-06-21: 100 mL/h via INTRAVENOUS
  Filled 2012-06-21: qty 1000

## 2012-06-21 MED ORDER — FENTANYL CITRATE 0.05 MG/ML IJ SOLN
INTRAMUSCULAR | Status: DC | PRN
  Administered 2012-06-21 (×2): 12.5 ug via INTRAVENOUS
  Administered 2012-06-21 (×3): 25 ug via INTRAVENOUS

## 2012-06-21 MED ORDER — LIDOCAINE HCL (CARDIAC) 20 MG/ML IV SOLN
INTRAVENOUS | Status: DC | PRN
  Administered 2012-06-21: 50 mg via INTRAVENOUS

## 2012-06-21 MED ORDER — ACETAMINOPHEN 325 MG PO TABS
650.0000 mg | ORAL_TABLET | ORAL | Status: DC | PRN
  Filled 2012-06-21: qty 2

## 2012-06-21 SURGICAL SUPPLY — 45 items
BLADE SURG 15 STRL LF DISP TIS (BLADE) IMPLANT
BLADE SURG 15 STRL SS (BLADE)
CANISTER SUCTION 2500CC (MISCELLANEOUS) ×3 IMPLANT
CLOTH BEACON ORANGE TIMEOUT ST (SAFETY) ×3 IMPLANT
COVER MAYO STAND STRL (DRAPES) ×3 IMPLANT
COVER TABLE BACK 60X90 (DRAPES) ×3 IMPLANT
DRAPE LG THREE QUARTER DISP (DRAPES) IMPLANT
DRSG PAD ABDOMINAL 8X10 ST (GAUZE/BANDAGES/DRESSINGS) ×3 IMPLANT
ELECT BLADE 6.5 .24CM SHAFT (ELECTRODE) ×3 IMPLANT
ELECT REM PT RETURN 9FT ADLT (ELECTROSURGICAL) ×3
ELECTRODE REM PT RTRN 9FT ADLT (ELECTROSURGICAL) ×2 IMPLANT
GLOVE BIO SURGEON STRL SZ 6 (GLOVE) ×3 IMPLANT
GLOVE BIO SURGEON STRL SZ 6.5 (GLOVE) ×9 IMPLANT
GLOVE BIOGEL PI IND STRL 7.0 (GLOVE) ×4 IMPLANT
GLOVE BIOGEL PI INDICATOR 7.0 (GLOVE) ×2
GLOVE ECLIPSE 6.0 STRL STRAW (GLOVE) ×6 IMPLANT
GLOVE INDICATOR 6.5 STRL GRN (GLOVE) ×3 IMPLANT
GOWN PREVENTION PLUS LG XLONG (DISPOSABLE) ×9 IMPLANT
GOWN STRL REIN XL XLG (GOWN DISPOSABLE) ×3 IMPLANT
KIT ANAL FISTULA (Mesh General) ×3 IMPLANT
LUBRICANT JELLY K Y 4OZ (MISCELLANEOUS) ×3 IMPLANT
NDL SAFETY ECLIPSE 18X1.5 (NEEDLE) IMPLANT
NEEDLE HYPO 18GX1.5 SHARP (NEEDLE)
NEEDLE HYPO 25X1 1.5 SAFETY (NEEDLE) ×3 IMPLANT
NS IRRIG 500ML POUR BTL (IV SOLUTION) ×3 IMPLANT
PACK BASIN DAY SURGERY FS (CUSTOM PROCEDURE TRAY) ×3 IMPLANT
PAD PREP 24X48 CUFFED NSTRL (MISCELLANEOUS) ×3 IMPLANT
PENCIL BUTTON HOLSTER BLD 10FT (ELECTRODE) ×3 IMPLANT
SPONGE GAUZE 4X4 12PLY (GAUZE/BANDAGES/DRESSINGS) ×3 IMPLANT
SPONGE SURGIFOAM ABS GEL 12-7 (HEMOSTASIS) IMPLANT
SUT CHROMIC 2 0 SH (SUTURE) IMPLANT
SUT CHROMIC 3 0 SH 27 (SUTURE) ×6 IMPLANT
SUT SILK 2 0 FS (SUTURE) ×6 IMPLANT
SUT SILK 2 0 TIES 17X18 (SUTURE) ×2
SUT SILK 2-0 18XBRD TIE BLK (SUTURE) ×4 IMPLANT
SUT VIC AB 4-0 P-3 18XBRD (SUTURE) IMPLANT
SUT VIC AB 4-0 P3 18 (SUTURE)
SYR 50ML LL SCALE MARK (SYRINGE) ×3 IMPLANT
SYR 5ML LL (SYRINGE) ×3 IMPLANT
SYR BULB IRRIGATION 50ML (SYRINGE) ×3 IMPLANT
SYR CONTROL 10ML LL (SYRINGE) ×3 IMPLANT
TOWEL OR 17X24 6PK STRL BLUE (TOWEL DISPOSABLE) ×3 IMPLANT
TRAY DSU PREP LF (CUSTOM PROCEDURE TRAY) ×3 IMPLANT
TUBE CONNECTING 12X1/4 (SUCTIONS) ×3 IMPLANT
YANKAUER SUCT BULB TIP NO VENT (SUCTIONS) ×3 IMPLANT

## 2012-06-21 NOTE — Transfer of Care (Signed)
Immediate Anesthesia Transfer of Care Note  Patient: [name]  Procedure(s) Performed: Procedure(s) (LRB): insertion of FISTULA PLUG (N/A) RECTAL EXAM UNDER ANESTHESIA  Patient Location: PACU  Anesthesia Type:MAC  Level of Consciousness: awake, alert  and oriented  Airway & Oxygen Therapy: Patient Spontanous Breathing and Patient connected to face mask oxygen  Post-op Assessment: Report given to PACU RN and Post -op Vital signs reviewed and stable  Post vital signs: Reviewed and stable  Complications: No apparent anesthesia complications, PIV infiltrated during surgery. Left 20 gauge PIV dc'd. Warm compress to left arm. PIV restarted in right hand with 22 g PIV.

## 2012-06-21 NOTE — Anesthesia Procedure Notes (Signed)
Procedure Name: MAC Date/Time: 06/21/2012 9:12 AM Performed by: Norva Pavlov Pre-anesthesia Checklist: Patient identified, Timeout performed, Emergency Drugs available, Suction available and Patient being monitored Patient Re-evaluated:Patient Re-evaluated prior to inductionOxygen Delivery Method: Simple face mask Preoxygenation: Pre-oxygenation with 100% oxygen

## 2012-06-21 NOTE — Op Note (Signed)
06/21/2012  10:55 AM  PATIENT:  [name]  79 y.o. female  Patient Care Team: Kaleen Mask, MD as PCP - General (Family Medicine) Kathleene Hazel, MD as Consulting Physician (Cardiology) Graylin Shiver, MD as Consulting Physician (Gastroenterology) Ladene Artist, MD as Consulting Physician (Internal Medicine) Billie Lade, MD as Consulting Physician (Radiation Oncology)  PRE-OPERATIVE DIAGNOSIS:  rectovaginal fistula  POST-OPERATIVE DIAGNOSIS:  rectovaginal fistula  PROCEDURE:  Procedure(s): insertion of FISTULA PLUG RECTAL EXAM UNDER ANESTHESIA  SURGEON:  Surgeon(s): Romie Levee, MD  ASSISTANT: none   ANESTHESIA:   IV sedation and topical  EBL:23ml  Total I/O In: 700 [I.V.:700] Out: -   Delay start of Pharmacological VTE agent (>24hrs) due to surgical blood loss or risk of bleeding:  no  DRAINS: none   SPECIMEN:  No Specimen  DISPOSITION OF SPECIMEN:  N/A  COUNTS:  YES  PLAN OF CARE: Discharge to home after PACU  PATIENT DISPOSITION:  PACU - hemodynamically stable.  INDICATION: This is a 79 y.o. F with a rectovaginal fistula.  We will evaluate for a fistula plug today  OR FINDINGS: mild rectal erythema, probably from diversion colitis, no active infection noted  DESCRIPTION: The patient was identified in the preoperative holding area and taken to the OR where they were laid prone on the operating room table. MAC anesthesia was smoothly induced.  The patient was then prepped and draped in the usual sterile fashion. A surgical timeout was performed indicating the correct patient, procedure, positioning and preoperative antibiotics. SCDs were noted to be in place and functioning prior to the operation.  I began by performing and anal exam.  DRE confirmed fistula site at dentate line.  A Hill Ferguson anoscope was inserted and no other abnormalities were noted.  The area in the anterior anal canal appears scarred.  There was soft mucosa above.   The fistula tract was irrigated with H2O2 and then water.  The fistula plug was brought onto the filed and hydrated.  A fistula probe was placed through the fistula and a silk suture was pulled through.  I then attached the fistula brush and brushed the fistula.  I then brought the fistula plug through the fistula site.  This was sutured on the rectal side with 2 interrupted 2-0 Vicryl sutures.  The remaining plug was trimmed.  A small musocal flap was brought over the plug site and sutured with interrupted 3-0 chromic sutures.  The canal was irrigated.  Hemostasis was good.  Rigid sigmoidoscopy revealed a patent rectum.  Lidocaine cream and a sterile dressing were applied.  The patient was awakened from sedation and sent to the PACU in stable condition.

## 2012-06-21 NOTE — H&P (Signed)
Chief Complaint   Patient presents with   .  New Evaluation     retrovaginal fistula   HISTORY: [name] is a 79 y.o. female who presents to the office with rectal and vaginal purulent drainage. She is s/p TEM for a T2 rectal cancer in Oct 2012. She developed an abscess postoperatively and a subsequent fistula. She underwent a diverting colostomy to allow the area to heal over a year ago, but it has persisited.  She underwent flexible sigmoidoscopy prior to this that showed no signs of malignancy as the cause of the fistula.  She has been on Augmentin prior to the surgery to help with her pelvic inflammation.  She had to stop this last weekend due to severe diarrhea.     Past Medical History   Diagnosis  Date   .  Arthritis    .  Irregular heart beat    .  Hyperlipidemia    .  CAD (coronary artery disease)      a. s/p INF STEMI 7/12: tx with BMS to RCA; b. cath 08/26/10: pLAD 30%, mLAD 50%, D1 40%, pCFX 95%, mRCA occluded; c. staged PCI of pCFX with BMS; d. echo 7/12: EF 60-65%, mild RAE, mild to moderate AI, mild MR, moderate TR, RVE, PASP 47   .  Hearing loss    .  Bilateral external ear infections    .  Rectal cancer      rectal   .  Rectal Cancer  08/2010     adenocarcinoma   .  Myocardial infarction  08/2010   .  Hypertension    .  Vaginal infection  November 2012     pt on antibiotics because infection really causing her to be fatigued   .  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  01/11/2011    Past Surgical History   Procedure  Date   .  Abdominal hysterectomy    .  Tonsillectomy    .  Total hip arthroplasty    .  Knee surgery    .  External ear surgery    .  Wrist surgery    .  Cardiac surgery      2 stints put in   .  Partial protectomy  11/17/2010     with colostomy 10/9    Current Outpatient Prescriptions   Medication  Sig  Dispense  Refill   .  acetaminophen (TYLENOL) 325 MG tablet  Take 650 mg by mouth every 6 (six) hours as needed.     Marland Kitchen   aspirin 81 MG tablet  Take 81 mg by mouth daily.     .  metoprolol (LOPRESSOR) 25 MG tablet  Take 1 tablet (25 mg total) by mouth 2 (two) times daily.  60 tablet  6   .  Multiple Vitamin (MULTIVITAMIN) tablet  Take 1 tablet by mouth daily.     Marland Kitchen  NITROSTAT 0.4 MG SL tablet  Place 0.4 mg under the tongue every 5 (five) minutes as needed.     .  zolpidem (AMBIEN) 5 MG tablet  Take 5 mg by mouth as needed.     .  [DISCONTINUED] omeprazole (PRILOSEC) 40 MG capsule  Take 40 mg by mouth as needed.      Allergies   Allergen  Reactions   .  Effexor (Venlafaxine Hydrochloride)    .  Sulfa Antibiotics  Nausea Only   .  Hydrocodone  Nausea Only    Family  History   Problem  Relation  Age of Onset   .  Kidney disease  Mother    .  Heart disease  Father    .  Cancer  Brother     History    Social History   .  Marital Status:  Widowed     Spouse Name:  N/A     Number of Children:  N/A   .  Years of Education:  N/A    Social History Main Topics   .  Smoking status:  Never Smoker   .  Smokeless tobacco:  Never Used   .  Alcohol Use:  No   .  Drug Use:  No   .  Sexually Active:  None    Other Topics  Concern   .  None    Social History Narrative   .  None   REVIEW OF SYSTEMS - PERTINENT POSITIVES ONLY:  Review of Systems - General ROS: negative for - chills, fever or weight loss  Hematological and Lymphatic ROS: negative for - bleeding problems, blood clots or bruising  Respiratory ROS: no cough, shortness of breath, or wheezing  Cardiovascular ROS: no chest pain or dyspnea on exertion  Gastrointestinal ROS: no abdominal pain, change in bowel habits, or black or bloody stools  Genito-Urinary ROS: no dysuria, trouble voiding, or hematuria   EXAM:  Filed Vitals:   06/21/12 0728  BP: 157/80  Pulse: 74  Temp: 96.8 F (36 C)  Resp: 16   General appearance: alert, cooperative and no distress  Resp: clear to auscultation bilaterally  Cardio: regular rate and rhythm  GI: normal  findings: soft, non-tender ostomy- pink and viable   Previous Anoscopy Findings: patient appears to have a large communication, large amount of purulence with the rectal vault, sigmoidoscope advanced to about 10-12cm   ASSESSMENT AND PLAN:  [name] is 79 y.o. F who was referred to me for a persistent recto-vaginal fistula despite diverting colostomy.  She had a flexible sigmoidoscopy and biopsy that showed no signs of malignancy.  She is here today for possilbe fistula plug placement.  I told her that the chances this would heal the fistula were 60-75% given that she is diverted.  The risks are mainly bleeding, infection, post operative pain and recurrence.  I believe she understands these risks and has agreed to proceed.   Vanita Panda, MD  Colon and Rectal Surgery / General Surgery  [facility]  Visit Diagnoses:  1.  Recto-vaginal fistula   Primary Care Physician:  Kaleen Mask, MD

## 2012-06-22 NOTE — Anesthesia Postprocedure Evaluation (Signed)
Anesthesia Post Note  Patient: [name]  Procedure(s) Performed: Procedure(s) (LRB): insertion of FISTULA PLUG (N/A) RECTAL EXAM UNDER ANESTHESIA  Anesthesia type: MAC  Patient location: PACU  Post pain: Pain level controlled  Post assessment: Post-op Vital signs reviewed  Last Vitals:  Filed Vitals:   06/21/12 1222  BP: 144/53  Pulse: 66  Temp: 36.1 C  Resp: 18    Post vital signs: Reviewed  Level of consciousness: sedated  Complications: No apparent anesthesia complications

## 2012-06-24 ENCOUNTER — Telehealth (INDEPENDENT_AMBULATORY_CARE_PROVIDER_SITE_OTHER): Payer: Self-pay | Admitting: General Surgery

## 2012-06-24 ENCOUNTER — Encounter (HOSPITAL_BASED_OUTPATIENT_CLINIC_OR_DEPARTMENT_OTHER): Payer: Self-pay | Admitting: General Surgery

## 2012-06-24 NOTE — Telephone Encounter (Signed)
Patient calling in status post insertion of a fistula plus on 06/21/2012. She states her bleeding has stopped but yesterday she started having mucous like drainage. She states the drainage gets a pad "pretty wet" in the span of 4 hours. She states there is a slight odor to the drainage. She has not been able to do a sitz bath because she can not get in her bath tub. Please advise if you need to see patient sooner.

## 2012-06-24 NOTE — Telephone Encounter (Signed)
That is expected.  Doesn't have to do the sitz baths if she doesn't want to.  AT

## 2012-06-24 NOTE — Telephone Encounter (Signed)
Spoke with patient and made her aware that this is normal. She will keep her follow up and call with any questions.

## 2012-07-15 ENCOUNTER — Encounter (INDEPENDENT_AMBULATORY_CARE_PROVIDER_SITE_OTHER): Payer: Self-pay | Admitting: General Surgery

## 2012-07-15 ENCOUNTER — Telehealth (INDEPENDENT_AMBULATORY_CARE_PROVIDER_SITE_OTHER): Payer: Self-pay

## 2012-07-15 ENCOUNTER — Other Ambulatory Visit (INDEPENDENT_AMBULATORY_CARE_PROVIDER_SITE_OTHER): Payer: Self-pay

## 2012-07-15 ENCOUNTER — Ambulatory Visit (INDEPENDENT_AMBULATORY_CARE_PROVIDER_SITE_OTHER): Payer: Medicare Other | Admitting: General Surgery

## 2012-07-15 VITALS — BP 162/78 | HR 62 | Temp 97.9°F | Resp 14 | Ht 61.0 in | Wt 111.8 lb

## 2012-07-15 DIAGNOSIS — N823 Fistula of vagina to large intestine: Secondary | ICD-10-CM

## 2012-07-15 DIAGNOSIS — Z9889 Other specified postprocedural states: Secondary | ICD-10-CM

## 2012-07-15 LAB — CBC W/MCH & 3 PART DIFF
Hemoglobin: 13.5 g/dL (ref 12.0–15.0)
MCHC: 33.9 g/dL (ref 30.0–36.0)
Neutro Abs: 4.7 10*3/uL (ref 1.7–7.7)
Neutrophils Relative %: 66 % (ref 43–77)
Platelets: 244 10*3/uL (ref 150–400)
RDW: 12.2 % (ref 11.5–15.5)

## 2012-07-15 NOTE — Telephone Encounter (Signed)
Patient calling in today to report an increase in pain, pressure and overall discomfort.  Patient has appointment on 07/26/12 w/Dr. Maisie Fus but, wishes to be seen much sooner due to her concerns with pain.  Reviewed with Dr. Maurine Minister nurse.  Per Dr. Maisie Fus patient need's to go to [facility] labs for a CBC and come in today for an appointment at 11:15 am.  Patient advised to go to [facility] for lab work and advised of appointment for today with Dr. Maisie Fus.  Patient given directions to the lab.

## 2012-07-15 NOTE — Patient Instructions (Signed)
I will see you back in 2 months.  Talk to Dr Jeannetta Nap about your nausea.

## 2012-07-15 NOTE — Progress Notes (Signed)
[name] is a 79 y.o. female who is status post a RVF plug placement on 5/9.  She is having quite a bit of nausea and GI distress.  She has recently been placed on Macrobid for a UTI.  She is still having a lot of vaginal drainage.  She is currently being treated for a UTI with Macrobid.  Objective: Filed Vitals:   07/15/12 1121  BP: 162/78  Pulse: 62  Temp: 97.9 F (36.6 C)  Resp: 14    General appearance: alert and cooperative GI: soft, non-tender; bowel sounds normal; no masses,  no organomegaly  Incision: purulent drainage noted in vagina, fistula palpated at post vaginal wall   Assessment: s/p  Patient Active Problem List   Diagnosis Date Noted  . Abdominal pain 11/01/2011  . Dyspnea 10/02/2011  . Palpitations 10/02/2011  . Vascular skin changes 07/17/2011  . 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 Mouth sore w/o ulceration 12/08/2010  . Anorexia post-op 12/08/2010  . HTN (hypertension) 11/03/2010  . Dizziness 09/15/2010  . CAD (coronary artery disease)   . Hyperlipidemia   . Rectal cancer, pT2uN0(pNX) s/p TEM partial proctectomy 09/07/2010    Plan: It appears her fistula plug has failed. This is most likely due to the scar tissue in the area.  I have recommended watching this for now. This is a small chance this could continue to heal.  If it becomes more of a problem in the future,  Iwould recommend a seton be placed permanently to assist with drainage.  The next surgical option would most likely be an episio-proctotomy, and she is certainly not interested in a surgery that involved. I have asked her to talk to Dr Jeannetta Nap about her nausea, as this seems to be an ongoing issue for her.      Vanita Panda, MD [facility], [place] [phone]   07/15/2012 11:34 AM

## 2012-07-16 ENCOUNTER — Telehealth (INDEPENDENT_AMBULATORY_CARE_PROVIDER_SITE_OTHER): Payer: Self-pay | Admitting: *Deleted

## 2012-07-16 NOTE — Telephone Encounter (Signed)
[facility] called to request a prescription for ostomy supplies: [employer]ister Ostomy bags, skin barriers, and deodorant drops for a 3 month supply.  Spoke with [name] [phone]. His fax # [phone].

## 2012-07-19 NOTE — Telephone Encounter (Signed)
I called to request a fax order form for supplies that Dr Maisie Fus can sign.  Dr Maisie Fus states this was Dr Michaell Cowing' previous patient that he may have prescribed some before.  I do not see where we have a form.  They will fax [place] one if they can.

## 2012-07-26 ENCOUNTER — Encounter (INDEPENDENT_AMBULATORY_CARE_PROVIDER_SITE_OTHER): Payer: Medicare Other | Admitting: General Surgery

## 2012-08-29 ENCOUNTER — Other Ambulatory Visit: Payer: Self-pay | Admitting: *Deleted

## 2012-08-29 DIAGNOSIS — R002 Palpitations: Secondary | ICD-10-CM

## 2012-08-29 MED ORDER — METOPROLOL TARTRATE 25 MG PO TABS: 25.0000 mg | ORAL_TABLET | Freq: Two times a day (BID) | ORAL | Status: DC

## 2012-09-16 ENCOUNTER — Encounter (INDEPENDENT_AMBULATORY_CARE_PROVIDER_SITE_OTHER): Payer: Medicare Other | Admitting: General Surgery

## 2012-09-17 ENCOUNTER — Ambulatory Visit (INDEPENDENT_AMBULATORY_CARE_PROVIDER_SITE_OTHER): Payer: Medicare Other | Admitting: General Surgery

## 2012-09-17 ENCOUNTER — Other Ambulatory Visit (INDEPENDENT_AMBULATORY_CARE_PROVIDER_SITE_OTHER): Payer: Self-pay

## 2012-09-17 ENCOUNTER — Encounter (INDEPENDENT_AMBULATORY_CARE_PROVIDER_SITE_OTHER): Payer: Self-pay | Admitting: General Surgery

## 2012-09-17 VITALS — BP 150/50 | HR 80 | Resp 14 | Ht 64.0 in | Wt 111.0 lb

## 2012-09-17 DIAGNOSIS — N824 Other female intestinal-genital tract fistulae: Secondary | ICD-10-CM

## 2012-09-17 DIAGNOSIS — N823 Fistula of vagina to large intestine: Secondary | ICD-10-CM

## 2012-09-17 NOTE — Patient Instructions (Signed)
Call the office if you'd like to schedule a seton placement.

## 2012-09-17 NOTE — Progress Notes (Signed)
The pt called after reviewing her avs and states she doesn't take Macrobid.  I told her I would be happy to remove it from her list.

## 2012-09-17 NOTE — Progress Notes (Signed)
[name] is a 79 y.o. female who is status post a RVF plug placement on 5/9.   She is still having a lot of vaginal drainage. She is currently being treated for a UTI and a ear infection.   Objective:  Filed Vitals:   09/17/12 1218  BP: 150/50  Pulse: 80  Resp: 14   General appearance: alert and cooperative  GI: soft, non-tender; bowel sounds normal; no masses, no organomegaly  Incision: purulent drainage noted in vagina, fistula palpated at post vaginal wall  Assessment:  s/p  Patient Active Problem List    Diagnosis  Date Noted   .  Abdominal pain  11/01/2011   .  Dyspnea  10/02/2011   .  Palpitations  10/02/2011   .  Vascular skin changes  07/17/2011   .  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  01/11/2011   .  Mouth sore w/o ulceration  12/08/2010   .  Anorexia post-op  12/08/2010   .  HTN (hypertension)  11/03/2010   .  Dizziness  09/15/2010   .  CAD (coronary artery disease)    .  Hyperlipidemia    .  Rectal cancer, pT2uN0(pNX) s/p TEM partial proctectomy  09/07/2010   Plan:  She is having persistent drainage and she has a UTI. This is most likely due to the scar tissue in the area. I have recommended watching this for now.  I would recommend a seton be placed permanently to assist with drainage. The next surgical option would most likely be an episio-proctotomy, and she is certainly not interested in a surgery that involved.    Vanita Panda, MD  [facility], [place]  [phone]

## 2012-10-17 ENCOUNTER — Ambulatory Visit (INDEPENDENT_AMBULATORY_CARE_PROVIDER_SITE_OTHER): Payer: Medicare Other | Admitting: Cardiovascular Disease

## 2012-10-17 ENCOUNTER — Encounter: Payer: Self-pay | Admitting: Cardiovascular Disease

## 2012-10-17 VITALS — BP 142/70 | HR 66 | Ht 64.0 in | Wt 110.0 lb

## 2012-10-17 DIAGNOSIS — I2510 Atherosclerotic heart disease of native coronary artery without angina pectoris: Secondary | ICD-10-CM

## 2012-10-17 DIAGNOSIS — I498 Other specified cardiac arrhythmias: Secondary | ICD-10-CM

## 2012-10-17 DIAGNOSIS — I471 Supraventricular tachycardia: Secondary | ICD-10-CM

## 2012-10-17 NOTE — Patient Instructions (Addendum)
Your physician wants you to follow-up in:  6 months. You will receive a reminder letter in the mail two months in advance. If you don't receive a letter, please call our office to schedule the follow-up appointment.   

## 2012-10-17 NOTE — Progress Notes (Signed)
History of Present Illness: 79 yo WF with a history of CAD, moderate AI, mild MR, rectal cancer, PAF here today for cardiac follow up. She was admitted 08/2010 with an inferior STEMI. Emergent cath done with placement of a BMS to the RCA for 100% occlusion. She was noted to have a high grade proximal CFX 95%. She was brought back to the cath lab a couple days later for staged PCI. She was treated with BMS to the CFX. Echo 08/27/11: EF 60-65%, mild RAE, mild to moderate AI, mild MR, moderate TR, RVE, PASP 47. She has undergone partial proctectomy with loop colostomy in 11/2010. She developed an abscess with fistula. She had issues with persistent rectovaginal fistula. I saw her 10/02/11 and she had c/o fatigue, dyspnea and palpitations. I arranged an echo which showed normal LV size and function with mild to moderate AI, moderate TR. 48 hour holter monitor showed NSR with PACs and runs of SVT with rare PVCs. No VT or atrial fibrillation noted. I increased her metoprolol.   She is here today for follow up. She has not felt palpitations. No chest pain but she feels tired. She still has drainage from her fistula. She has been trying to keep her po intake up.  Primary Care Physician: Windle Guard   Last Lipid Profile:Lipid Panel     Component Value Date/Time   CHOL 129 07/31/2011 1010   TRIG 88.0 07/31/2011 1010   HDL 74.20 07/31/2011 1010   CHOLHDL 2 07/31/2011 1010   VLDL 17.6 07/31/2011 1010   LDLCALC 37 07/31/2011 1010     Past Medical History  Diagnosis Date  . Hyperlipidemia   . Hypertension   . 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 History of ST elevation myocardial infarction (STEMI) 08-26-2010-- INFERIOR WALL    S/P PCI  BMS IN RCA AND PROX. CX  . S/P coronary artery stent placement 08/2010    X2  BM  . CAD (coronary artery disease) CARDIOLOGIST-- DR Clifton James    a. s/p INF STEMI 7/12: tx with BMS to RCA;  b. cath 08/26/10: pLAD 30%, mLAD 50%, D1 40%, pCFX 95%,  mRCA occluded;   c. staged PCI of pCFX with BMS;   d. echo 7/12:   EF 60-65%, mild RAE, mild to moderate AI, mild MR, moderate TR, RVE, PASP 47  . PAF (paroxysmal atrial fibrillation)   . Pulmonary nodules BENIGN  PER CT  10-12-2010  . Dyspnea   . Arthritis     FINGERS   . Osteopenia   . Impaired hearing BILATERAL HEARING AIDS  . Heart palpitations PAC'S AND SVT RUN'S  PER CARDIOLOGIST NOTE  . Rectal Cancer 08/2010    adenocarcinoma   S/P PARTIAL PROCTECTOMY (NO CHEMO OR RADIATION)  . S/P colostomy SECONDARY TO RECTOVAGINAL FISTULA  . First degree heart block   . Complication of anesthesia PT STATES "MADE HER FEEL CRAZY"  . PONV (postoperative nausea and vomiting)     Past Surgical History  Procedure Laterality Date  . Knee surgery Right 1996  . Flexible sigmoidoscopy N/A 04/02/2012    Procedure: FLEXIBLE SIGMOIDOSCOPY;  Surgeon: Romie Levee, MD;  Location: [facility];  Service: Endoscopy;  Laterality: N/A;  . Partial proctectomy by tem  11-17-2010    RECTAL CANCER  . Laproscopy lysis adhesions/ drainage of pelvic abscess/ diverting loop sigmoid colectomy  11-22-2010    POST OP RECTOVAGINAL FISTULA  . Tympanoplasty Left 12-23-2009  . Stapedectomy  1970'S  .  Total hip arthroplasty Right 1992  . Excision benign cyst right breast    . Abdominal hysterectomy  1950's    AND APPENDECTOMY  . Tonsillectomy  CHILD  . Coronary angioplasty with stent placement  08-26-2010  DR [facility]    PCI, BM STENT IN RCA  . Coronary angioplasty with stent placement  08-29-2010  DR [facility]    PCI, BM STENT IN PROXIMAL CIRCUMFLEX  . Release left carpal tunnel/ osteotomy left distal radius  11-10-2009  . Cataract extraction w/ intraocular lens  implant, bilateral    . Transthoracic echocardiogram  10-10-2011    NORMAL LV SIZE WITH MILD FOCAL BASAL SEPTAL HYPERTROPHY/ EF 55-60%/ NORMAL RV SIZE AND LVSF/ BIATRIAL ENLARGEMENT/ MILD TO MODERATE AI  &  TR  . Fistula plug N/A 06/21/2012    Procedure:  insertion of FISTULA PLUG;  Surgeon: Romie Levee, MD;  Location: [facility];  Service: General;  Laterality: N/A;    Current Outpatient Prescriptions  Medication Sig Dispense Refill  . acetaminophen (TYLENOL) 325 MG tablet Take 650 mg by mouth every 6 (six) hours as needed.       Marland Kitchen aspirin 81 MG tablet Take 81 mg by mouth daily.       Marland Kitchen aspirin-acetaminophen-caffeine (EXCEDRIN MIGRAINE) 250-250-65 MG per tablet Take 1 tablet by mouth every 6 (six) hours as needed for pain.      . Melatonin 1 MG TABS Take by mouth. 1 tab at night to help sleep if needed      . metoprolol tartrate (LOPRESSOR) 25 MG tablet 1/2 tab bid      . Multiple Vitamin (MULTIVITAMIN) tablet Take 1 tablet by mouth daily.       Marland Kitchen NITROSTAT 0.4 MG SL tablet Place 0.4 mg under the tongue every 5 (five) minutes as needed.       . [DISCONTINUED] omeprazole (PRILOSEC) 40 MG capsule Take 40 mg by mouth as needed.       No current facility-administered medications for this visit.    Allergies  Allergen Reactions  . Effexor [Venlafaxine Hydrochloride] Other (See Comments)    UNKNOWN  . Sulfa Antibiotics Nausea Only  . Hydrocodone Nausea Only    History   Social History  . Marital Status: Widowed    Spouse Name: N/A    Number of Children: N/A  . Years of Education: N/A   Occupational History  . Not on file.   Social History Main Topics  . Smoking status: Never Smoker   . Smokeless tobacco: Never Used  . Alcohol Use: No  . Drug Use: No  . Sexual Activity: Not on file   Other Topics Concern  . Not on file   Social History Narrative  . No narrative on file    Family History  Problem Relation Age of Onset  . Kidney disease Mother   . Heart disease Father   . Cancer Brother     Review of Systems:  As stated in the HPI and otherwise negative.   BP 142/70  Pulse 66  Ht 5\' 4"  (1.626 m)  Wt 110 lb (49.896 kg)  BMI 18.87 kg/m2  Physical Examination: General: Well developed, well  nourished, NAD HEENT: OP clear, mucus membranes moist SKIN: warm, dry. No rashes. Neuro: No focal deficits Musculoskeletal: Muscle strength 5/5 all ext Psychiatric: Mood and affect normal Neck: No JVD, no carotid bruits, no thyromegaly, no lymphadenopathy. Lungs:Clear bilaterally, no wheezes, rhonci, crackles Cardiovascular: Regular rate and rhythm. No murmurs, gallops or  rubs. Abdomen:Soft. Bowel sounds present. Non-tender.  Extremities: No lower extremity edema. Pulses are 2 + in the bilateral DP/PT.  Assessment and Plan:   1. CAD: Stable. No chest pains. Lipids are at goal. She is not on a statin any longer given age. LV Function has been normal.   2. Palpitations: Stable on metoprolol. Likely due to SVT and PACs.

## 2012-11-11 ENCOUNTER — Telehealth (INDEPENDENT_AMBULATORY_CARE_PROVIDER_SITE_OTHER): Payer: Self-pay

## 2012-11-11 NOTE — Telephone Encounter (Signed)
Patient had called in wanting to talk to Dr Maisie Fus.  I spoke to her to see what kind of problem she is having.  She states she is not feeling well.  She is nauseated most days and has no appetite.  She has been constipated most of the past 2 months.  She has tried some prune juice.  Some days she gets cramping abdominal pain and feels like she needs to have a bowel movement and she gets home and has a lot in her bag to empty.  She has vaginal itching and has taken Vagisil which helped that.  I told her she must have a yeast infection there.  I told her Dr Maisie Fus recommends Miralax daily, Fiber supplement bid, and a stool softener daily.  I encouraged increased fluid intake.  I told her to try those recommendations for a while and call us back if no better.

## 2012-11-18 NOTE — Telephone Encounter (Signed)
Agreed.  She should see her PCP if not feeling better with these recommendations.

## 2012-11-18 NOTE — Telephone Encounter (Signed)
Patient states she had a large BM Sunday, she continues to have decreased appetite.  She is asking if she will have to use miralax continuously. Advised her continue fiber rich foods in her diet as instructed per sarah. I also advised her to heat the 8oz  prune juice sometimes that helps, to call if she had further questions. She states she has vaginal itching I advised her to call her GYN/PCP Patient verbalized understanding.

## 2012-11-21 ENCOUNTER — Telehealth (INDEPENDENT_AMBULATORY_CARE_PROVIDER_SITE_OTHER): Payer: Self-pay

## 2012-11-21 NOTE — Telephone Encounter (Signed)
I called the pt and discussed her issues.  She still is complaining of the drainage.  She asked if it would cause nausea and I told her no.  She asked if what she ate would affect it and I told her I didn't think so.  She has no fever.  She has had a treatment for vaginal yeast infection but it isn't quite cleared up.  I told her it is from the moisture.  She should try to keep clean and dry.  I know it is hard to do.  She asked if there is a cream she can use and I told her only in the perirectal area she could use diaper rash ointment.  She has tried A and D ointment.  I asked her to make an appointment to come in and discuss further.  I scheduled her to come in 10/14 at 0940.

## 2012-11-21 NOTE — Telephone Encounter (Signed)
Message copied by Ivory Broad on Thu Nov 21, 2012  4:03 PM ------      Message from: Consuelo Pandy      Created: Thu Nov 21, 2012 10:32 AM      Regarding: Please call      Contact: [phone]       Patient only wanting to speak with Huntley Dec.            Thanks      Haig Prophet ------

## 2012-11-26 ENCOUNTER — Encounter (INDEPENDENT_AMBULATORY_CARE_PROVIDER_SITE_OTHER): Payer: Self-pay

## 2012-11-26 ENCOUNTER — Ambulatory Visit (INDEPENDENT_AMBULATORY_CARE_PROVIDER_SITE_OTHER): Payer: Medicare Other | Admitting: General Surgery

## 2012-11-26 ENCOUNTER — Encounter (INDEPENDENT_AMBULATORY_CARE_PROVIDER_SITE_OTHER): Payer: Self-pay | Admitting: General Surgery

## 2012-11-26 VITALS — BP 128/60 | HR 72 | Temp 98.4°F | Resp 14 | Ht 64.5 in | Wt 112.2 lb

## 2012-11-26 DIAGNOSIS — B373 Candidiasis of vulva and vagina: Secondary | ICD-10-CM

## 2012-11-26 MED ORDER — NYSTATIN-TRIAMCINOLONE 100000-0.1 UNIT/GM-% EX OINT: TOPICAL_OINTMENT | Freq: Two times a day (BID) | CUTANEOUS | Status: DC

## 2012-11-26 NOTE — Progress Notes (Signed)
[name] is a 79 y.o. female who is here for a follow up visit regarding her RVF.  She c/o nausea, bloating and abd pain as well as increased burning and itching vaginally.  She states her vaginal symptoms got better with a dose of diflucan but returned once she stopped this.  She equates an episode of nausea with this medicine as well.  Denies fevers or vomiting.  She states abd symptoms got better with miralax.    Objective: Filed Vitals:   11/26/12 0951  BP: 128/60  Pulse: 72  Temp: 98.4 F (36.9 C)  Resp: 14    General appearance: alert and cooperative GI: normal findings: soft, non-tender, colon with stool palpated in RUQ ostomy: pink, hard stool at os Vaginal mucosal irritation noted, no fluctuant perirectal or vaginal wall masses.     Assessment and Plan: [name] is a 79 y.o. F with a RVF who has refused further operative treatment.  For her constipation, I recommended stopping dulcolax and replacing with miralax 1-2 times a day.  If this does not resolve her issues, then we may need a CT scan to further evaluate her abdomen.  For her vaginal irritation, I have prescribe a nystatin cream and recommended a barrier cream once the skin has healed.  If this doesn't work then she may need a longer course of diflucan orally.      Vanita Panda, MD [facility], [place] [phone]

## 2012-11-26 NOTE — Patient Instructions (Signed)
Stop taking dulcolax and start miralax 1-2 times a day

## 2013-01-22 ENCOUNTER — Telehealth: Payer: Self-pay | Admitting: Cardiovascular Disease

## 2013-01-22 NOTE — Telephone Encounter (Signed)
No answer will try back later.

## 2013-01-22 NOTE — Telephone Encounter (Signed)
New Problem:  Pt is asking if she needs blood work for her March 2015 appt. Pt does not have orders in Epic. Pt is requesting a call back from the nurse to let her know if she needs her cholesterol checked.

## 2013-01-22 NOTE — Telephone Encounter (Signed)
Called stating she has a follow up appointment April 28 2013.   Wants to know if she should have her cholesterol checked.  Last check was 07/31/11. Advised that Dr. Sanjuana Kava nor his nurse Charlotte Crumb) is in the office today.  Will forward message to them and someone will get back in touch with her.

## 2013-01-23 NOTE — Telephone Encounter (Signed)
Spoke with pt and gave her information from Dr.McAlhany. She would like to have lab work done same day as appt with Dr. Clifton James. She will be fasting when she comes to the appt.

## 2013-01-23 NOTE — Telephone Encounter (Signed)
We can check lipids. cdm

## 2013-02-24 ENCOUNTER — Telehealth: Payer: Self-pay | Admitting: Cardiovascular Disease

## 2013-02-24 NOTE — Telephone Encounter (Signed)
New message   Patient did not disclose any information . Will discuss when the nurse call.

## 2013-02-24 NOTE — Telephone Encounter (Signed)
Spoke with pt who reports she has office visit scheduled with Dr. Angelena Form in March but she would like to schedule earlier appt. She reports she is feeling weak and having shortness of breath at times. Aware of skipped hearts beats. These are not new. Reports she did a lot over Christmas and was tired at that time but continues to feel weak and tires easily.  Appt made for pt to see Dr. Angelena Form on February 25, 2013 at 8:15.

## 2013-02-25 ENCOUNTER — Ambulatory Visit (INDEPENDENT_AMBULATORY_CARE_PROVIDER_SITE_OTHER): Payer: Medicare Other | Admitting: Cardiovascular Disease

## 2013-02-25 ENCOUNTER — Encounter: Payer: Self-pay | Admitting: Cardiovascular Disease

## 2013-02-25 VITALS — BP 158/74 | HR 77 | Ht 64.5 in | Wt 112.0 lb

## 2013-02-25 DIAGNOSIS — I359 Nonrheumatic aortic valve disorder, unspecified: Secondary | ICD-10-CM

## 2013-02-25 DIAGNOSIS — I2510 Atherosclerotic heart disease of native coronary artery without angina pectoris: Secondary | ICD-10-CM

## 2013-02-25 DIAGNOSIS — I471 Supraventricular tachycardia: Secondary | ICD-10-CM

## 2013-02-25 DIAGNOSIS — I10 Essential (primary) hypertension: Secondary | ICD-10-CM

## 2013-02-25 DIAGNOSIS — I351 Nonrheumatic aortic (valve) insufficiency: Secondary | ICD-10-CM

## 2013-02-25 DIAGNOSIS — I498 Other specified cardiac arrhythmias: Secondary | ICD-10-CM

## 2013-02-25 MED ORDER — METOPROLOL TARTRATE 25 MG PO TABS: 25.0000 mg | ORAL_TABLET | Freq: Two times a day (BID) | ORAL | Status: DC

## 2013-02-25 NOTE — Progress Notes (Signed)
History of Present Illness: 80 yo WF with a history of CAD, moderate AI, mild MR, rectal cancer, PAF here today for cardiac follow up. She was admitted 08/2010 with an inferior STEMI. Emergent cath done with placement of a BMS to the RCA for 100% occlusion. She was noted to have a high grade proximal CFX 95%. She was brought back to the cath lab a couple days later for staged PCI. She was treated with BMS to the CFX. Echo 08/27/11: EF 60-65%, mild RAE, mild to moderate AI, mild MR, moderate TR, RVE, PASP 47. She has undergone partial proctectomy with loop colostomy in 11/2010. She developed an abscess with fistula. She had issues with persistent rectovaginal fistula. I saw her 10/02/11 and she had c/o fatigue, dyspnea and palpitations. I arranged an echo which showed normal LV size and function with mild to moderate AI, moderate TR. 48 hour holter monitor showed NSR with PACs and runs of SVT with rare PVCs. No VT or atrial fibrillation noted.   She is here today for follow up. She is having frequent palpitations. No chest pain but she feels tired. She still has drainage from her fistula. She has been trying to keep her po intake up. Her metoprolol was decreased in primary care last year. Since then she is feeling more palpitations.   Primary Care Physician: Claris Gower   Last Lipid Profile:Lipid Panel     Component Value Date/Time   CHOL 129 07/31/2011 1010   TRIG 88.0 07/31/2011 1010   HDL 74.20 07/31/2011 1010   CHOLHDL 2 07/31/2011 1010   VLDL 17.6 07/31/2011 1010   LDLCALC 37 07/31/2011 1010     Past Medical History  Diagnosis Date  . Hyperlipidemia   . Hypertension   . 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 History of ST elevation myocardial infarction (STEMI) 08-26-2010-- INFERIOR WALL    S/P PCI  BMS IN RCA AND PROX. CX  . S/P coronary artery stent placement 08/2010    X2  BM  . CAD (coronary artery disease) CARDIOLOGIST-- DR Angelena Form    a. s/p INF STEMI  7/12: tx with BMS to RCA;  b. cath 08/26/10: pLAD 30%, mLAD 50%, D1 40%, pCFX 95%, mRCA occluded;   c. staged PCI of pCFX with BMS;   d. echo 7/12:   EF 60-65%, mild RAE, mild to moderate AI, mild MR, moderate TR, RVE, PASP 47  . PAF (paroxysmal atrial fibrillation)   . Pulmonary nodules BENIGN  PER CT  10-12-2010  . Dyspnea   . Arthritis     FINGERS   . Osteopenia   . Impaired hearing BILATERAL HEARING AIDS  . Heart palpitations PAC'S AND SVT RUN'S  PER CARDIOLOGIST NOTE  . Rectal Cancer 08/2010    adenocarcinoma   S/P PARTIAL PROCTECTOMY (NO CHEMO OR RADIATION)  . S/P colostomy SECONDARY TO RECTOVAGINAL FISTULA  . First degree heart block   . Complication of anesthesia PT STATES "MADE HER FEEL CRAZY"  . PONV (postoperative nausea and vomiting)     Past Surgical History  Procedure Laterality Date  . Knee surgery Right 1996  . Flexible sigmoidoscopy N/A 04/02/2012    Procedure: FLEXIBLE SIGMOIDOSCOPY;  Surgeon: Leighton Ruff, MD;  Location: [facility];  Service: Endoscopy;  Laterality: N/A;  . Partial proctectomy by tem  11-17-2010    RECTAL CANCER  . Laproscopy lysis adhesions/ drainage of pelvic abscess/ diverting loop sigmoid colectomy  11-22-2010    POST OP  RECTOVAGINAL FISTULA  . Tympanoplasty Left 12-23-2009  . Stapedectomy  1970'S  . Total hip arthroplasty Right 1992  . Excision benign cyst right breast    . Abdominal hysterectomy  1950's    AND APPENDECTOMY  . Tonsillectomy  CHILD  . Coronary angioplasty with stent placement  08-26-2010  DR [facility]    PCI, BM STENT IN RCA  . Coronary angioplasty with stent placement  08-29-2010  DR [facility]    PCI, BM STENT IN PROXIMAL CIRCUMFLEX  . Release left carpal tunnel/ osteotomy left distal radius  11-10-2009  . Cataract extraction w/ intraocular lens  implant, bilateral    . Transthoracic echocardiogram  10-10-2011    NORMAL LV SIZE WITH MILD FOCAL BASAL SEPTAL HYPERTROPHY/ EF 55-60%/ NORMAL RV SIZE AND LVSF/ BIATRIAL  ENLARGEMENT/ MILD TO MODERATE AI  &  TR  . Fistula plug N/A 06/21/2012    Procedure: insertion of FISTULA PLUG;  Surgeon: Leighton Ruff, MD;  Location: [place];  Service: General;  Laterality: N/A;    Current Outpatient Prescriptions  Medication Sig Dispense Refill  . acetaminophen (TYLENOL) 325 MG tablet Take 650 mg by mouth every 6 (six) hours as needed.       Marland Kitchen aspirin 81 MG tablet Take 81 mg by mouth daily.       Marland Kitchen aspirin-acetaminophen-caffeine (EXCEDRIN MIGRAINE) 250-250-65 MG per tablet Take 1 tablet by mouth every 6 (six) hours as needed for pain.      . benzocaine-resorcinol (VAGISIL) 5-2 % vaginal cream Place vaginally at bedtime.      . fluconazole (DIFLUCAN) 150 MG tablet       . metoprolol tartrate (LOPRESSOR) 25 MG tablet 1/2 tab bid      . Multiple Vitamin (MULTIVITAMIN) tablet Take 1 tablet by mouth daily.       Marland Kitchen NITROSTAT 0.4 MG SL tablet Place 0.4 mg under the tongue every 5 (five) minutes as needed.       . nystatin-triamcinolone ointment (MYCOLOG) Apply topically 2 (two) times daily.  30 g  0  . zolpidem (AMBIEN) 5 MG tablet Take 5 mg by mouth at bedtime as needed.       . [DISCONTINUED] omeprazole (PRILOSEC) 40 MG capsule Take 40 mg by mouth as needed.       No current facility-administered medications for this visit.    Allergies  Allergen Reactions  . Effexor [Venlafaxine Hydrochloride] Other (See Comments)    UNKNOWN  . Sulfa Antibiotics Nausea Only  . Hydrocodone Nausea Only    History   Social History  . Marital Status: Widowed    Spouse Name: N/A    Number of Children: N/A  . Years of Education: N/A   Occupational History  . Not on file.   Social History Main Topics  . Smoking status: Never Smoker   . Smokeless tobacco: Never Used  . Alcohol Use: No  . Drug Use: No  . Sexual Activity: Not on file   Other Topics Concern  . Not on file   Social History Narrative  . No narrative on file    Family History  Problem  Relation Age of Onset  . Kidney disease Mother   . Heart disease Father   . Cancer Brother     Review of Systems:  As stated in the HPI and otherwise negative.   BP 158/74  Pulse 77  Ht 5' 4.5" (1.638 m)  Wt 112 lb (50.803 kg)  BMI 18.93 kg/m2  Physical Examination:  General: Well developed, well nourished, NAD HEENT: OP clear, mucus membranes moist SKIN: warm, dry. No rashes. Neuro: No focal deficits Musculoskeletal: Muscle strength 5/5 all ext Psychiatric: Mood and affect normal Neck: No JVD, no carotid bruits, no thyromegaly, no lymphadenopathy. Lungs:Clear bilaterally, no wheezes, rhonci, crackles Cardiovascular: Regular rate and rhythm. No murmurs, gallops or rubs. Abdomen:Soft. Bowel sounds present. Non-tender.  Extremities: No lower extremity edema. Pulses are 2 + in the bilateral DP/PT.  EKG:  Sinus, rate 77 bpm. PVCs. PAC. Incomplete RBBB. Poor R wave progression.   Assessment and Plan:   1. CAD: Stable. No chest pains. Lipids were at goal in 2013 and given age we are not checking anymore. She is not on a statin any longer given age. LV function has been normal.   2. Palpitations: Likely due to SVT and PACs. Will try to increase metoprolol 25 mg po BID. She can take an extra pill as needed. I will see her back in 4 weeks and if no improvement, may consider Cardizem.   3. HTN: BP elevated. Increase metoprolol.   4. Aortic insufficiency: Moderate by echo 2013. Will not repeat echo given her age. She is not a candidate for open heart surgery.

## 2013-02-25 NOTE — Patient Instructions (Signed)
Keep scheduled follow up appt with Dr. Angelena Form on April 18, 2013. Fasting lab work will be done at this appt.  Your physician has recommended you make the following change in your medication:  Increase metoprolol tartrate (lopressor) to 25 mg by mouth twice daily

## 2013-04-18 ENCOUNTER — Other Ambulatory Visit: Payer: Medicare Other

## 2013-04-18 ENCOUNTER — Ambulatory Visit (INDEPENDENT_AMBULATORY_CARE_PROVIDER_SITE_OTHER): Payer: Medicare Other | Admitting: Cardiovascular Disease

## 2013-04-18 ENCOUNTER — Encounter: Payer: Self-pay | Admitting: Cardiovascular Disease

## 2013-04-18 VITALS — BP 124/64 | HR 58 | Ht 64.5 in | Wt 112.0 lb

## 2013-04-18 DIAGNOSIS — I10 Essential (primary) hypertension: Secondary | ICD-10-CM

## 2013-04-18 DIAGNOSIS — I2510 Atherosclerotic heart disease of native coronary artery without angina pectoris: Secondary | ICD-10-CM

## 2013-04-18 DIAGNOSIS — R002 Palpitations: Secondary | ICD-10-CM

## 2013-04-18 LAB — BASIC METABOLIC PANEL
BUN: 19 mg/dL (ref 6–23)
CALCIUM: 10.1 mg/dL (ref 8.4–10.5)
CO2: 27 mEq/L (ref 19–32)
CREATININE: 0.7 mg/dL (ref 0.4–1.2)
Chloride: 103 mEq/L (ref 96–112)
GFR: 86.96 mL/min (ref >60.00)
Glucose, Bld: 75 mg/dL (ref 70–99)
Potassium: 4.1 mEq/L (ref 3.5–5.1)
SODIUM: 139 meq/L (ref 135–145)

## 2013-04-18 NOTE — Progress Notes (Signed)
History of Present Illness: 80 yo WF with a history of CAD, moderate AI, mild MR, rectal cancer, PAF here today for cardiac follow up. [name] was admitted 08/2010 with an inferior STEMI. Emergent cath done with placement of a BMS to the RCA for 100% occlusion. [name] was noted to have a high grade proximal CFX 95%. [name] was brought back to the cath lab a couple days later for staged PCI. [name] was treated with BMS to the CFX. Echo 08/27/11: EF 60-65%, mild RAE, mild to moderate AI, mild MR, moderate TR, RVE, PASP 47. [name] has undergone partial proctectomy with loop colostomy in 11/2010. [name] developed an abscess with fistula. [name] had issues with persistent rectovaginal fistula. I saw her 10/02/11 and [name] had c/o fatigue, dyspnea and palpitations. I arranged an echo which showed normal LV size and function with mild to moderate AI, moderate TR. 48 hour holter monitor showed NSR with PACs and runs of SVT with rare PVCs. No VT or atrial fibrillation noted. I saw her in January 2015 and [name] was still having palpitations. Metoprolol increased to 25 mg po BID.   [name] is here today for follow up. [name] is having frequent palpitations. No chest pain but [name] feels tired. [name] still has drainage from her fistula. [name] has been trying to keep her po intake up. Her metoprolol was decreased in primary care last year. Since then [name] is feeling more palpitations.   Primary Care Physician: Claris Gower   Last Lipid Profile:Lipid Panel     Component Value Date/Time   CHOL 129 07/31/2011 1010   TRIG 88.0 07/31/2011 1010   HDL 74.20 07/31/2011 1010   CHOLHDL 2 07/31/2011 1010   VLDL 17.6 07/31/2011 1010   LDLCALC 37 07/31/2011 1010     Past Medical History  Diagnosis Date  . Hyperlipidemia   . Hypertension   . 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 History of ST elevation myocardial infarction (STEMI) 08-26-2010-- INFERIOR WALL    S/P PCI  BMS IN RCA AND PROX. CX  . S/P coronary artery stent  placement 08/2010    X2  BM  . CAD (coronary artery disease) CARDIOLOGIST-- DR Angelena Form    a. s/p INF STEMI 7/12: tx with BMS to RCA;  b. cath 08/26/10: pLAD 30%, mLAD 50%, D1 40%, pCFX 95%, mRCA occluded;   c. staged PCI of pCFX with BMS;   d. echo 7/12:   EF 60-65%, mild RAE, mild to moderate AI, mild MR, moderate TR, RVE, PASP 47  . PAF (paroxysmal atrial fibrillation)   . Pulmonary nodules BENIGN  PER CT  10-12-2010  . Dyspnea   . Arthritis     FINGERS   . Osteopenia   . Impaired hearing BILATERAL HEARING AIDS  . Heart palpitations PAC'S AND SVT RUN'S  PER CARDIOLOGIST NOTE  . Rectal Cancer 08/2010    adenocarcinoma   S/P PARTIAL PROCTECTOMY (NO CHEMO OR RADIATION)  . S/P colostomy SECONDARY TO RECTOVAGINAL FISTULA  . First degree heart block   . Complication of anesthesia PT STATES "MADE HER FEEL CRAZY"  . PONV (postoperative nausea and vomiting)     Past Surgical History  Procedure Laterality Date  . Knee surgery Right 1996  . Flexible sigmoidoscopy N/A 04/02/2012    Procedure: FLEXIBLE SIGMOIDOSCOPY;  Surgeon: Leighton Ruff, MD;  Location: [facility];  Service: Endoscopy;  Laterality: N/A;  . Partial proctectomy by tem  11-17-2010    RECTAL CANCER  .  Laproscopy lysis adhesions/ drainage of pelvic abscess/ diverting loop sigmoid colectomy  11-22-2010    POST OP RECTOVAGINAL FISTULA  . Tympanoplasty Left 12-23-2009  . Stapedectomy  1970'S  . Total hip arthroplasty Right 1992  . Excision benign cyst right breast    . Abdominal hysterectomy  1950's    AND APPENDECTOMY  . Tonsillectomy  CHILD  . Coronary angioplasty with stent placement  08-26-2010  DR [facility]    PCI, BM STENT IN RCA  . Coronary angioplasty with stent placement  08-29-2010  DR [facility]    PCI, BM STENT IN PROXIMAL CIRCUMFLEX  . Release left carpal tunnel/ osteotomy left distal radius  11-10-2009  . Cataract extraction w/ intraocular lens  implant, bilateral    . Transthoracic echocardiogram  10-10-2011     NORMAL LV SIZE WITH MILD FOCAL BASAL SEPTAL HYPERTROPHY/ EF 55-60%/ NORMAL RV SIZE AND LVSF/ BIATRIAL ENLARGEMENT/ MILD TO MODERATE AI  &  TR  . Fistula plug N/A 06/21/2012    Procedure: insertion of FISTULA PLUG;  Surgeon: Leighton Ruff, MD;  Location: [place];  Service: General;  Laterality: N/A;    Current Outpatient Prescriptions  Medication Sig Dispense Refill  . acetaminophen (TYLENOL) 325 MG tablet Take 650 mg by mouth every 6 (six) hours as needed.       Marland Kitchen aspirin 81 MG tablet Take 81 mg by mouth daily.       Marland Kitchen aspirin-acetaminophen-caffeine (EXCEDRIN MIGRAINE) 250-250-65 MG per tablet Take 1 tablet by mouth every 6 (six) hours as needed for pain.      . beta carotene w/minerals (OCUVITE) tablet Take 1 tablet by mouth daily.      . metoprolol tartrate (LOPRESSOR) 25 MG tablet Take 1 tablet (25 mg total) by mouth 2 (two) times daily.  60 tablet  6  . Multiple Vitamin (MULTIVITAMIN) tablet Take 1 tablet by mouth daily.       Marland Kitchen NITROSTAT 0.4 MG SL tablet Place 0.4 mg under the tongue every 5 (five) minutes as needed.       Vladimir Faster Glycol-Propyl Glycol (SYSTANE OP) Apply to eye.      Vladimir Faster Glycol-Propyl Glycol (SYSTANE) 0.4-0.3 % GEL Apply to eye.      . zolpidem (AMBIEN) 5 MG tablet Take 5 mg by mouth at bedtime as needed.       . [DISCONTINUED] omeprazole (PRILOSEC) 40 MG capsule Take 40 mg by mouth as needed.       No current facility-administered medications for this visit.    Allergies  Allergen Reactions  . Effexor [Venlafaxine Hydrochloride] Other (See Comments)    UNKNOWN  . Sulfa Antibiotics Nausea Only  . Hydrocodone Nausea Only    History   Social History  . Marital Status: Widowed    Spouse Name: N/A    Number of Children: N/A  . Years of Education: N/A   Occupational History  . Not on file.   Social History Main Topics  . Smoking status: Never Smoker   . Smokeless tobacco: Never Used  . Alcohol Use: No  . Drug Use: No  . Sexual  Activity: Not on file   Other Topics Concern  . Not on file   Social History Narrative  . No narrative on file    Family History  Problem Relation Age of Onset  . Kidney disease Mother   . Heart disease Father   . Cancer Brother     Review of Systems:  As stated in  the HPI and otherwise negative.   BP 124/64  Pulse 58  Ht 5' 4.5" (1.638 m)  Wt 112 lb (50.803 kg)  BMI 18.93 kg/m2  SpO2 94%  Physical Examination: General: Well developed, well nourished, NAD HEENT: OP clear, mucus membranes moist SKIN: warm, dry. No rashes. Neuro: No focal deficits Musculoskeletal: Muscle strength 5/5 all ext Psychiatric: Mood and affect normal Neck: No JVD, no carotid bruits, no thyromegaly, no lymphadenopathy. Lungs:Clear bilaterally, no wheezes, rhonci, crackles Cardiovascular: Regular rate and rhythm. No murmurs, gallops or rubs. Abdomen:Soft. Bowel sounds present. Non-tender.  Extremities: No lower extremity edema. Pulses are 2 + in the bilateral DP/PT.  Assessment and Plan:   1. CAD: Stable. No chest pains. Lipids were at goal in 2013 and given age we are not checking anymore. [name] is not on a statin any longer given age. LV function has been normal.   2. Palpitations: Much improved. Will continue metoprolol 25 mg po BID.   3. HTN: BP controlled. NO changes.   4. Aortic insufficiency: Moderate by echo 2013. Will not repeat echo given her age. [name] is not a candidate for open heart surgery.

## 2013-04-18 NOTE — Patient Instructions (Signed)
Your physician wants you to follow-up in:  6 months. You will receive a reminder letter in the mail two months in advance. If you don't receive a letter, please call our office to schedule the follow-up appointment.   

## 2013-04-25 ENCOUNTER — Telehealth: Payer: Self-pay | Admitting: Cardiovascular Disease

## 2013-04-25 NOTE — Telephone Encounter (Signed)
Returned call to patient 04/18/13 bmet results given.Copy mailed to patient.

## 2013-04-25 NOTE — Telephone Encounter (Signed)
New message    Calling for test results  

## 2013-06-25 ENCOUNTER — Other Ambulatory Visit: Payer: Self-pay | Admitting: Cardiovascular Disease

## 2013-06-26 ENCOUNTER — Other Ambulatory Visit: Payer: Self-pay

## 2013-06-26 ENCOUNTER — Telehealth: Payer: Self-pay | Admitting: Cardiovascular Disease

## 2013-06-26 ENCOUNTER — Telehealth: Payer: Self-pay

## 2013-06-26 DIAGNOSIS — I471 Supraventricular tachycardia: Secondary | ICD-10-CM

## 2013-06-26 DIAGNOSIS — I2510 Atherosclerotic heart disease of native coronary artery without angina pectoris: Secondary | ICD-10-CM

## 2013-06-26 MED ORDER — METOPROLOL TARTRATE 25 MG PO TABS: 25.0000 mg | ORAL_TABLET | Freq: Two times a day (BID) | ORAL | Status: DC

## 2013-06-26 NOTE — Telephone Encounter (Signed)
error 

## 2013-06-26 NOTE — Telephone Encounter (Signed)
Error.. Pt needs a refill... Transferring to refills

## 2013-07-02 ENCOUNTER — Encounter: Payer: Self-pay | Admitting: Cardiovascular Disease

## 2013-07-22 ENCOUNTER — Telehealth: Payer: Self-pay | Admitting: Cardiovascular Disease

## 2013-07-22 NOTE — Telephone Encounter (Signed)
Returned call to patient she stated she has been so weak she can hardly walk from room to room.Stated she saw PCP lab work and cxr done and everything ok.Stated PCP advised metoprolol can cause weakness.Dr.McAlhany out of office.Spoke to DOD Dr.Varanasi he advised to decrease metoprolol to 25 mg 1/2 tablet twice a day.6 month f/u scheduled with Dr.McAlhany 10/31/13 at 9:15 am.Advised to call back if not better.Dr.Elkins office called lab work and cxr report requested to be faxed to office.

## 2013-07-22 NOTE — Telephone Encounter (Signed)
New problem   Pt having problem with her blood pressure and need to speak to nurse. Please call pt.

## 2013-07-28 NOTE — Telephone Encounter (Signed)
Agree. cdm 

## 2013-09-02 ENCOUNTER — Telehealth (INDEPENDENT_AMBULATORY_CARE_PROVIDER_SITE_OTHER): Payer: Self-pay | Admitting: General Surgery

## 2013-09-02 NOTE — Telephone Encounter (Signed)
Attempted to call patient. Left VM.

## 2013-09-02 NOTE — Telephone Encounter (Signed)
Patient states she is at home , DR. Marcello Moores can call anytime.

## 2013-09-03 ENCOUNTER — Telehealth (INDEPENDENT_AMBULATORY_CARE_PROVIDER_SITE_OTHER): Payer: Self-pay | Admitting: General Surgery

## 2013-09-03 NOTE — Telephone Encounter (Signed)
Returned call.  Pt did not answer.  Left VM

## 2013-09-04 ENCOUNTER — Ambulatory Visit (INDEPENDENT_AMBULATORY_CARE_PROVIDER_SITE_OTHER): Payer: Medicare Other | Admitting: Internal Medicine

## 2013-09-04 ENCOUNTER — Ambulatory Visit (INDEPENDENT_AMBULATORY_CARE_PROVIDER_SITE_OTHER)
Admission: RE | Admit: 2013-09-04 | Discharge: 2013-09-04 | Disposition: A | Discharge status: Home / Self Care | Payer: Medicare Other | Source: Ambulatory Visit | Attending: Internal Medicine | Admitting: Internal Medicine

## 2013-09-04 ENCOUNTER — Encounter: Payer: Self-pay | Admitting: Internal Medicine

## 2013-09-04 VITALS — BP 118/66 | HR 70 | Ht 64.0 in | Wt 113.8 lb

## 2013-09-04 DIAGNOSIS — R05 Cough: Secondary | ICD-10-CM

## 2013-09-04 DIAGNOSIS — R053 Chronic cough: Secondary | ICD-10-CM

## 2013-09-04 DIAGNOSIS — R911 Solitary pulmonary nodule: Secondary | ICD-10-CM

## 2013-09-04 DIAGNOSIS — R059 Cough, unspecified: Secondary | ICD-10-CM

## 2013-09-04 DIAGNOSIS — Z201 Contact with and (suspected) exposure to tuberculosis: Secondary | ICD-10-CM

## 2013-09-04 NOTE — Progress Notes (Signed)
09/04/13- 89 yoF never smoker COMPLAINS OF:  Referred by Dr. Claris Gower per CXR done 07/02/13 pt has films.  CXR 07/02/13 dark technique- compared with prior 2007, note calcified nodular density LLL which may be parenchymal nodule or calcium in rib cartilage. She has been complaining of feeling weak and tired for some months., with almost incidental mild intermittent cough/ shortness of breath. CXR done as part of assessment. Hx MI 2012/ stent but denies swelling, palpitation, chest pain. Hx rectal cancer surgery 2012 with fistula and colostomy. 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 She reports a church trip to visit a sanatorium long ago. Widowed, living alone.  Prior to Admission medications   Medication Sig Start Date End Date Taking? Authorizing Provider  acetaminophen (TYLENOL) 325 MG tablet Take 650 mg by mouth every 6 (six) hours as needed.    Yes Historical Provider, MD  aspirin 81 MG tablet Take 81 mg by mouth daily.    Yes Historical Provider, MD  aspirin-acetaminophen-caffeine (EXCEDRIN MIGRAINE) [phone] MG per tablet Take 1 tablet by mouth every 6 (six) hours as needed for pain.   Yes Historical Provider, MD  metoprolol tartrate (LOPRESSOR) 25 MG tablet Take 1/2 tablet twice a day 07/22/13  Yes Jettie Booze, MD  Multiple Vitamin (MULTIVITAMIN) tablet Take 1 tablet by mouth daily.    Yes Historical Provider, MD  NITROSTAT 0.4 MG SL tablet Place 0.4 mg under the tongue every 5 (five) minutes as needed.  08/31/10  Yes Historical Provider, MD  Polyethyl Glycol-Propyl Glycol (SYSTANE) 0.4-0.3 % GEL Apply to eye.   Yes Historical Provider, MD  zolpidem (AMBIEN) 5 MG tablet Take 5 mg by mouth at bedtime as needed.  11/21/12   Historical Provider, MD   Past Medical History  Diagnosis Date  . Hyperlipidemia   . Hypertension   . 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 History of ST elevation myocardial infarction (STEMI) 08-26-2010--  INFERIOR WALL    S/P PCI  BMS IN RCA AND PROX. CX  . S/P coronary artery stent placement 08/2010    X2  BM  . CAD (coronary artery disease) CARDIOLOGIST-- DR Angelena Form    a. s/p INF STEMI 7/12: tx with BMS to RCA;  b. cath 08/26/10: pLAD 30%, mLAD 50%, D1 40%, pCFX 95%, mRCA occluded;   c. staged PCI of pCFX with BMS;   d. echo 7/12:   EF 60-65%, mild RAE, mild to moderate AI, mild MR, moderate TR, RVE, PASP 47  . PAF (paroxysmal atrial fibrillation)   . Pulmonary nodules BENIGN  PER CT  10-12-2010  . Dyspnea   . Arthritis     FINGERS   . Osteopenia   . Impaired hearing BILATERAL HEARING AIDS  . Heart palpitations PAC'S AND SVT RUN'S  PER CARDIOLOGIST NOTE  . Rectal Cancer 08/2010    adenocarcinoma   S/P PARTIAL PROCTECTOMY (NO CHEMO OR RADIATION)  . S/P colostomy SECONDARY TO RECTOVAGINAL FISTULA  . First degree heart block   . Complication of anesthesia PT STATES "MADE HER FEEL CRAZY"  . PONV (postoperative nausea and vomiting)    Past Surgical History  Procedure Laterality Date  . Knee surgery Right 1996  . Flexible sigmoidoscopy N/A 04/02/2012    Procedure: FLEXIBLE SIGMOIDOSCOPY;  Surgeon: Leighton Ruff, MD;  Location: [facility];  Service: Endoscopy;  Laterality: N/A;  . Partial proctectomy by tem  11-17-2010    RECTAL CANCER  . Laproscopy lysis adhesions/  drainage of pelvic abscess/ diverting loop sigmoid colectomy  11-22-2010    POST OP RECTOVAGINAL FISTULA  . Tympanoplasty Left 12-23-2009  . Stapedectomy  1970'S  . Total hip arthroplasty Right 1992  . Excision benign cyst right breast    . Abdominal hysterectomy  1950's    AND APPENDECTOMY  . Tonsillectomy  CHILD  . Coronary angioplasty with stent placement  08-26-2010  DR [facility]    PCI, BM STENT IN RCA  . Coronary angioplasty with stent placement  08-29-2010  DR [facility]    PCI, BM STENT IN PROXIMAL CIRCUMFLEX  . Release left carpal tunnel/ osteotomy left distal radius  11-10-2009  . Cataract extraction w/  intraocular lens  implant, bilateral    . Transthoracic echocardiogram  10-10-2011    NORMAL LV SIZE WITH MILD FOCAL BASAL SEPTAL HYPERTROPHY/ EF 55-60%/ NORMAL RV SIZE AND LVSF/ BIATRIAL ENLARGEMENT/ MILD TO MODERATE AI  &  TR  . Fistula plug N/A 06/21/2012    Procedure: insertion of FISTULA PLUG;  Surgeon: Leighton Ruff, MD;  Location: [place];  Service: General;  Laterality: N/A;   Family History  Problem Relation Age of Onset  . Kidney disease Mother   . Heart disease Father   . Cancer Brother    History   Social History  . Marital Status: Widowed    Spouse Name: N/A    Number of Children: N/A  . Years of Education: N/A   Occupational History  . Not on file.   Social History Main Topics  . Smoking status: Never Smoker   . Smokeless tobacco: Never Used  . Alcohol Use: No  . Drug Use: No  . Sexual Activity: Not on file   Other Topics Concern  . Not on file   Social History Narrative  . No narrative on file   ROS-see HPI Constitutional:   No-   weight loss, night sweats, fevers, chills, fatigue, lassitude. HEENT:   No-  headaches, difficulty swallowing, tooth/dental problems, sore throat,       +sneezing,No- itching, ear ache, nasal congestion, post nasal drip,  CV:  No-   chest pain, orthopnea, PND, swelling in lower extremities, anasarca,                                  dizziness, +palpitations Resp: No-   shortness of breath with exertion or at rest.              No-   productive cough,  No non-productive cough,  No- coughing up of blood.              No-   change in color of mucus.  No- wheezing.   Skin: No-   rash or lesions. GI:  No-   heartburn, indigestion, abdominal pain, nausea, vomiting, diarrhea,                 change in bowel habits, loss of appetite GU: No-   dysuria, change in color of urine, no urgency or frequency.  No- flank pain. MS:  No-   joint pain or swelling.  No- decreased range of motion.  No- back pain. Neuro-     nothing  unusual Psych:  No- change in mood or affect. No depression or anxiety.  No memory loss.  OBJ- Physical Exam  Frail elderly lady General- Alert, Oriented, Affect-appropriate, Distress- none acute Skin- rash-none, lesions- none, excoriation- none  Lymphadenopathy- none Head- atraumatic            Eyes- Gross vision intact, PERRLA, conjunctivae and secretions clear            Ears- +Hard of hearing            Nose- Clear, no-Septal dev, mucus, polyps, erosion, perforation             Throat- Mallampati II , mucosa clear , drainage- none, tonsils- atrophic Neck- flexible , trachea midline, no stridor , thyroid nl, carotid no bruit Chest - symmetrical excursion , unlabored           Heart/CV- RRR , no murmur , no gallop  , no rub, nl s1 s2                           - JVD- none , edema- none, stasis changes- none, varices- none           Lung- clear to P&A, wheeze- none, cough+dry , dullness-none, rub- none           Chest wall- +kyphosis Abd- tender-no, distended-no, bowel sounds-present, HSM- no, +L colostomy Br/ Gen/ Rectal- Not done, not indicated Extrem- cyanosis- none, clubbing, none, atrophy- none, strength- nl Neuro- grossly intact to observation

## 2013-09-04 NOTE — Patient Instructions (Signed)
Order- CXR, chronic cough, remote TB exposure

## 2013-09-05 ENCOUNTER — Other Ambulatory Visit: Payer: Self-pay | Admitting: Internal Medicine

## 2013-09-05 ENCOUNTER — Telehealth: Payer: Self-pay | Admitting: Internal Medicine

## 2013-09-05 ENCOUNTER — Encounter: Payer: Self-pay | Admitting: Internal Medicine

## 2013-09-05 DIAGNOSIS — Z201 Contact with and (suspected) exposure to tuberculosis: Secondary | ICD-10-CM | POA: Insufficient documentation

## 2013-09-05 DIAGNOSIS — R911 Solitary pulmonary nodule: Secondary | ICD-10-CM | POA: Insufficient documentation

## 2013-09-05 DIAGNOSIS — R918 Other nonspecific abnormal finding of lung field: Secondary | ICD-10-CM

## 2013-09-05 NOTE — Telephone Encounter (Signed)
Notes Recorded by Deneise Lever, MD on 09/05/2013 at 2:01 PM CXR- No active problem seen. Given history, the radiologist recommends a CT scan to evaluate for small nodules. Suggest CT chest, no contrast Dx lung nodules on CXR ---  I spoke with patient about results and she verbalized understanding and had no questions. Pt did not have a pencil when giving results. Noting further needed

## 2013-09-05 NOTE — Assessment & Plan Note (Signed)
May have been exposed on remote church [place]. Has vague systemic symptoms now, but active TB unlikely. Plan- update CXR for comparison. Consider CT for small details.

## 2013-09-05 NOTE — Progress Notes (Signed)
Quick Note:  Spoke with pt and notified of results per Dr. Young. Pt verbalized understanding and denied any questions.  ______ 

## 2013-09-05 NOTE — Assessment & Plan Note (Signed)
On CXR file from Dr Arelia Sneddon 07/02/13- returned. Calcified nodule LLL apparently new since comparison 2007. Hx TB exposure. More recent mild intermittent cough, weakness, shortness of breath with exertion Doubt TB Plan- update CXR for comparison. Consider CT based on report.

## 2013-09-08 ENCOUNTER — Telehealth: Payer: Self-pay | Admitting: Internal Medicine

## 2013-09-08 DIAGNOSIS — R911 Solitary pulmonary nodule: Secondary | ICD-10-CM

## 2013-09-08 NOTE — Telephone Encounter (Signed)
ATC patient-unable to reach patient and no VM options. Will need to call back later.

## 2013-09-09 NOTE — Telephone Encounter (Signed)
Called spoke with pt. She is wanting more understanding of her CXR results. Notes Recorded by Deneise Lever, MD on 09/05/2013 at 2:01 PM CXR- No active problem seen. Given history, the radiologist recommends a CT scan to evaluate for small nodules. Suggest CT chest, no contrast Dx lung nodules on CXR   She reports what history are we referring too? Reports she is 80 y/o and does not understand why she needs this done now. She would like to speak w/ CDY personally. Please advise thanks

## 2013-09-09 NOTE — Telephone Encounter (Signed)
Pt calling back again today wanting ti speak with dr young personally.[name]

## 2013-09-09 NOTE — Telephone Encounter (Signed)
i returned her call. A chest CT in 2011 had shown small bilateral nodules, thought likely benign, and 1 larger nodule. Option to repeat CT in 1 year was mentioned at that time. Seeing that, in conjunction with apparent nodule on current CXR and recent office visit, I had offered new CT to compare.  She feels at age 80 that options would be limited. She prefers to wait and follow in 6 months with CXR here, so we will arrange it.  Plan- d/c order for Chest CT          Schedule f/u here in 6 months, wth CXR for dx lung nodule

## 2013-09-10 ENCOUNTER — Other Ambulatory Visit: Payer: Medicare Other

## 2013-09-10 NOTE — Telephone Encounter (Signed)
Spoke with pt, she is aware of CY's recs.  Scheduled rov for 1/28 @10 :15, she is aware that we will get a cxr on that visit.  Future CXR order already placed.  D/c ct chest order.  Nothing further needed at this time.

## 2013-09-16 ENCOUNTER — Telehealth: Payer: Self-pay | Admitting: Internal Medicine

## 2013-09-16 NOTE — Telephone Encounter (Signed)
Called spoke with Jeani Hawking from Dr. Arelia Sneddon office. She reports Dr. Arelia Sneddon would like to speak with Dr. Annamaria Boots regarding plan for pt. She is aware he is out of the office this week. Can be reached at the # above. Please advise thanks

## 2013-09-25 ENCOUNTER — Encounter (HOSPITAL_COMMUNITY): Payer: Self-pay | Admitting: Emergency Medicine

## 2013-09-25 ENCOUNTER — Emergency Department (HOSPITAL_COMMUNITY)
Admission: EM | Admit: 2013-09-25 | Discharge: 2013-09-26 | Disposition: A | Discharge status: Home / Self Care | Payer: Medicare Other | Attending: Emergency Medicine | Admitting: Emergency Medicine

## 2013-09-25 DIAGNOSIS — M19049 Primary osteoarthritis, unspecified hand: Secondary | ICD-10-CM | POA: Insufficient documentation

## 2013-09-25 DIAGNOSIS — Z7982 Long term (current) use of aspirin: Secondary | ICD-10-CM | POA: Insufficient documentation

## 2013-09-25 DIAGNOSIS — I10 Essential (primary) hypertension: Secondary | ICD-10-CM | POA: Insufficient documentation

## 2013-09-25 DIAGNOSIS — I2510 Atherosclerotic heart disease of native coronary artery without angina pectoris: Secondary | ICD-10-CM | POA: Diagnosis not present

## 2013-09-25 DIAGNOSIS — I252 Old myocardial infarction: Secondary | ICD-10-CM | POA: Diagnosis not present

## 2013-09-25 DIAGNOSIS — M79609 Pain in unspecified limb: Secondary | ICD-10-CM | POA: Diagnosis not present

## 2013-09-25 DIAGNOSIS — Z8742 Personal history of other diseases of the female genital tract: Secondary | ICD-10-CM | POA: Insufficient documentation

## 2013-09-25 DIAGNOSIS — M79672 Pain in left foot: Secondary | ICD-10-CM

## 2013-09-25 DIAGNOSIS — Z79899 Other long term (current) drug therapy: Secondary | ICD-10-CM | POA: Diagnosis not present

## 2013-09-25 DIAGNOSIS — Z8669 Personal history of other diseases of the nervous system and sense organs: Secondary | ICD-10-CM | POA: Insufficient documentation

## 2013-09-25 DIAGNOSIS — Z862 Personal history of diseases of the blood and blood-forming organs and certain disorders involving the immune mechanism: Secondary | ICD-10-CM | POA: Diagnosis not present

## 2013-09-25 DIAGNOSIS — Z9861 Coronary angioplasty status: Secondary | ICD-10-CM | POA: Insufficient documentation

## 2013-09-25 DIAGNOSIS — Z9889 Other specified postprocedural states: Secondary | ICD-10-CM | POA: Insufficient documentation

## 2013-09-25 DIAGNOSIS — Z85048 Personal history of other malignant neoplasm of rectum, rectosigmoid junction, and anus: Secondary | ICD-10-CM | POA: Diagnosis not present

## 2013-09-25 DIAGNOSIS — Z8639 Personal history of other endocrine, nutritional and metabolic disease: Secondary | ICD-10-CM | POA: Diagnosis not present

## 2013-09-25 NOTE — ED Notes (Signed)
Patient says she had the pain when [employer] was on. The pain was sharp in her foot. Patient says that she has had before and it is worse. The MD told her that it was a nerve pain. Patient has had pain in the past in her arm and her inner thigh. Pain is worse than she has ever had. Patient says that the pain takes her breath away.

## 2013-09-25 NOTE — ED Notes (Addendum)
Pt presents with c/o left foot pain. Pt says that she was out in her yard and after she came back in, pt says that she felt a stinging pain in her foot. Pt says that she rubbed some alcohol on it and later in the evening she started feeling a shooting pain in that area. Pt says that she has had this pain before and her doctor told her that it was nerve pain. During triage, pt will feel that pain and gasp every few seconds, pt reports that it takes her breath away when it hits.

## 2013-09-26 NOTE — Telephone Encounter (Signed)
Please advise if this can be closed Dr. Annamaria Boots? thanks

## 2013-09-26 NOTE — ED Provider Notes (Signed)
CSN: [id number]     Arrival date & time 09/25/13  2126 History   First MD Initiated Contact with Patient 09/26/13 0044     Chief Complaint  Patient presents with  . Foot Pain     (Consider location/radiation/quality/duration/timing/severity/associated sxs/prior Treatment) HPI This is a 80 year old female who had the sudden onset of a sharp, staining, burning pain on the dorsum of her left foot about 7 PM. She initially thought she got bit or stung by something but there was no redness or swelling and none has developed. She has subsequently had an intermittent, sharp, severe, well-localized pain at the site. It is exacerbated by palpation. She has had unexplained similar pains in the past in her thigh and arm which her PCP attributed to nerve pain.  Past Medical History  Diagnosis Date  . Hyperlipidemia   . Hypertension   . 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 History of ST elevation myocardial infarction (STEMI) 08-26-2010-- INFERIOR WALL    S/P PCI  BMS IN RCA AND PROX. CX  . S/P coronary artery stent placement 08/2010    X2  BM  . CAD (coronary artery disease) CARDIOLOGIST-- DR Angelena Form    a. s/p INF STEMI 7/12: tx with BMS to RCA;  b. cath 08/26/10: pLAD 30%, mLAD 50%, D1 40%, pCFX 95%, mRCA occluded;   c. staged PCI of pCFX with BMS;   d. echo 7/12:   EF 60-65%, mild RAE, mild to moderate AI, mild MR, moderate TR, RVE, PASP 47  . PAF (paroxysmal atrial fibrillation)   . Pulmonary nodules BENIGN  PER CT  10-12-2010  . Dyspnea   . Arthritis     FINGERS   . Osteopenia   . Impaired hearing BILATERAL HEARING AIDS  . Heart palpitations PAC'S AND SVT RUN'S  PER CARDIOLOGIST NOTE  . Rectal Cancer 08/2010    adenocarcinoma   S/P PARTIAL PROCTECTOMY (NO CHEMO OR RADIATION)  . S/P colostomy SECONDARY TO RECTOVAGINAL FISTULA  . First degree heart block   . Complication of anesthesia PT STATES "MADE HER FEEL CRAZY"  . PONV (postoperative nausea and vomiting)     Past Surgical History  Procedure Laterality Date  . Knee surgery Right 1996  . Flexible sigmoidoscopy N/A 04/02/2012    Procedure: FLEXIBLE SIGMOIDOSCOPY;  Surgeon: Leighton Ruff, MD;  Location: [facility];  Service: Endoscopy;  Laterality: N/A;  . Partial proctectomy by tem  11-17-2010    RECTAL CANCER  . Laproscopy lysis adhesions/ drainage of pelvic abscess/ diverting loop sigmoid colectomy  11-22-2010    POST OP RECTOVAGINAL FISTULA  . Tympanoplasty Left 12-23-2009  . Stapedectomy  1970'S  . Total hip arthroplasty Right 1992  . Excision benign cyst right breast    . Abdominal hysterectomy  1950's    AND APPENDECTOMY  . Tonsillectomy  CHILD  . Coronary angioplasty with stent placement  08-26-2010  DR [facility]    PCI, BM STENT IN RCA  . Coronary angioplasty with stent placement  08-29-2010  DR [facility]    PCI, BM STENT IN PROXIMAL CIRCUMFLEX  . Release left carpal tunnel/ osteotomy left distal radius  11-10-2009  . Cataract extraction w/ intraocular lens  implant, bilateral    . Transthoracic echocardiogram  10-10-2011    NORMAL LV SIZE WITH MILD FOCAL BASAL SEPTAL HYPERTROPHY/ EF 55-60%/ NORMAL RV SIZE AND LVSF/ BIATRIAL ENLARGEMENT/ MILD TO MODERATE AI  &  TR  . Fistula plug N/A 06/21/2012    Procedure: insertion  of FISTULA PLUG;  Surgeon: Leighton Ruff, MD;  Location: [facility];  Service: General;  Laterality: N/A;   Family History  Problem Relation Age of Onset  . Kidney disease Mother   . Heart disease Father   . Cancer Brother    History  Substance Use Topics  . Smoking status: Never Smoker   . Smokeless tobacco: Never Used  . Alcohol Use: No   OB History   Grav Para Term Preterm Abortions TAB SAB Ect Mult Living                 Review of Systems  All other systems reviewed and are negative.   Allergies  Effexor; Sulfa antibiotics; Dicyclomine; and Hydrocodone  Home Medications   Prior to Admission medications   Medication Sig Start  Date End Date Taking? Authorizing Provider  acetaminophen (TYLENOL) 325 MG tablet Take 650 mg by mouth every 6 (six) hours as needed for mild pain.    Yes Historical Provider, MD  acidophilus (RISAQUAD) CAPS capsule Take 1 capsule by mouth daily.   Yes Historical Provider, MD  aspirin 81 MG tablet Take 81 mg by mouth daily.    Yes Historical Provider, MD  beta carotene w/minerals (OCUVITE) tablet Take 1 tablet by mouth daily.   Yes Historical Provider, MD  metoprolol tartrate (LOPRESSOR) 25 MG tablet Take 25 mg by mouth 2 (two) times daily.  07/22/13  Yes Jettie Booze, MD  Multiple Vitamin (MULTIVITAMIN) tablet Take 1 tablet by mouth daily.    Yes Historical Provider, MD  NITROSTAT 0.4 MG SL tablet Place 0.4 mg under the tongue every 5 (five) minutes as needed for chest pain.  08/31/10  Yes Historical Provider, MD  Polyethyl Glycol-Propyl Glycol (SYSTANE) 0.4-0.3 % GEL Apply to eye.   Yes Historical Provider, MD   BP 180/56  Pulse 80  Temp(Src) 97.8 F (36.6 C) (Oral)  Resp 12  SpO2 100%  Physical Exam General: Well-developed, well-nourished female in no acute distress; appearance consistent with age of record HENT: normocephalic; atraumatic Eyes: pupils equal, round and reactive to light; extraocular muscles intact Neck: supple Heart: regular rate and rhythm Lungs: clear to auscultation bilaterally Abdomen: soft; nondistended; nontender; bowel sounds present Extremities: No deformity; full range of motion; pulses normal; tenderness of the skin at about the base of the first metatarsal without bony deformity or point tenderness, no associated erythema, warmth, ecchymosis or erythema Neurologic: Awake, alert and oriented; motor function intact in all extremities and symmetric; no facial droop Skin: Warm and dry Psychiatric: Normal mood and affect    ED Course  Procedures (including critical care time)  MDM  The patient states that she does not tolerate hydrocodone Roxicodone.  She states she does have tramadol at home and she will take this for pain. She has not tried it yet.    Wynetta Fines, MD 09/26/13 [phone]

## 2013-09-29 NOTE — Telephone Encounter (Signed)
I called Dr Arelia Sneddon, discussed the patient visit, cxr report and my follow-up phone call with her. Dr Arelia Sneddon agreed with plan to see her back for f/u CXR in about 6 months as planned, given her symptoms. We discussed calcification that may be a calcified mitral annulus and old granulomas, without seeing current evidence of active TB.

## 2013-10-31 ENCOUNTER — Ambulatory Visit (INDEPENDENT_AMBULATORY_CARE_PROVIDER_SITE_OTHER): Payer: Medicare Other | Admitting: Cardiovascular Disease

## 2013-10-31 ENCOUNTER — Encounter: Payer: Self-pay | Admitting: Cardiovascular Disease

## 2013-10-31 VITALS — BP 152/74 | HR 65 | Ht 64.5 in | Wt 111.6 lb

## 2013-10-31 DIAGNOSIS — I359 Nonrheumatic aortic valve disorder, unspecified: Secondary | ICD-10-CM

## 2013-10-31 DIAGNOSIS — R002 Palpitations: Secondary | ICD-10-CM

## 2013-10-31 DIAGNOSIS — I10 Essential (primary) hypertension: Secondary | ICD-10-CM

## 2013-10-31 DIAGNOSIS — I351 Nonrheumatic aortic (valve) insufficiency: Secondary | ICD-10-CM

## 2013-10-31 DIAGNOSIS — I2510 Atherosclerotic heart disease of native coronary artery without angina pectoris: Secondary | ICD-10-CM

## 2013-10-31 MED ORDER — NITROGLYCERIN 0.4 MG SL SUBL: 0.4000 mg | SUBLINGUAL_TABLET | SUBLINGUAL | Status: DC | PRN

## 2013-10-31 NOTE — Progress Notes (Signed)
History of Present Illness: 80 yo WF with a history of CAD, moderate AI, mild MR, rectal cancer, PAF here today for cardiac follow up. She was admitted 08/2010 with an inferior STEMI. Emergent cath done with placement of a BMS to the RCA for 100% occlusion. She was noted to have a high grade proximal CFX 95%. She was brought back to the cath lab a couple days later for staged PCI. She was treated with BMS to the CFX. Echo 08/27/11: EF 60-65%, mild RAE, mild to moderate AI, mild MR, moderate TR, RVE, PASP 47. She has undergone partial proctectomy with loop colostomy in 11/2010. She developed an abscess with fistula. She had issues with persistent rectovaginal fistula. I saw her 10/02/11 and she had c/o fatigue, dyspnea and palpitations. I arranged an echo which showed normal LV size and function with mild to moderate AI, moderate TR. 48 hour holter monitor showed NSR with PACs and runs of SVT with rare PVCs. No VT or atrial fibrillation noted. I saw her in January 2015 and she was still having palpitations. Metoprolol increased to 25 mg po BID. Metoprolol lowered to 12.5 mg po BID due to weakness June 2015 but she has increased back to 25 mg po BID.   She is here today for follow up. She has rare palpitations. No chest pain or SOB. She still has drainage from her fistula. She has been trying to keep her po intake up.    Primary Care Physician: Claris Gower   Past Medical History  Diagnosis Date  . Hyperlipidemia   . Hypertension   . 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 History of ST elevation myocardial infarction (STEMI) 08-26-2010-- INFERIOR WALL    S/P PCI  BMS IN RCA AND PROX. CX  . S/P coronary artery stent placement 08/2010    X2  BM  . CAD (coronary artery disease) CARDIOLOGIST-- DR Angelena Form    a. s/p INF STEMI 7/12: tx with BMS to RCA;  b. cath 08/26/10: pLAD 30%, mLAD 50%, D1 40%, pCFX 95%, mRCA occluded;   c. staged PCI of pCFX with BMS;   d. echo 7/12:   EF  60-65%, mild RAE, mild to moderate AI, mild MR, moderate TR, RVE, PASP 47  . PAF (paroxysmal atrial fibrillation)   . Pulmonary nodules BENIGN  PER CT  10-12-2010  . Dyspnea   . Arthritis     FINGERS   . Osteopenia   . Impaired hearing BILATERAL HEARING AIDS  . Heart palpitations PAC'S AND SVT RUN'S  PER CARDIOLOGIST NOTE  . Rectal Cancer 08/2010    adenocarcinoma   S/P PARTIAL PROCTECTOMY (NO CHEMO OR RADIATION)  . S/P colostomy SECONDARY TO RECTOVAGINAL FISTULA  . First degree heart block   . Complication of anesthesia PT STATES "MADE HER FEEL CRAZY"  . PONV (postoperative nausea and vomiting)     Past Surgical History  Procedure Laterality Date  . Knee surgery Right 1996  . Flexible sigmoidoscopy N/A 04/02/2012    Procedure: FLEXIBLE SIGMOIDOSCOPY;  Surgeon: Leighton Ruff, MD;  Location: [facility];  Service: Endoscopy;  Laterality: N/A;  . Partial proctectomy by tem  11-17-2010    RECTAL CANCER  . Laproscopy lysis adhesions/ drainage of pelvic abscess/ diverting loop sigmoid colectomy  11-22-2010    POST OP RECTOVAGINAL FISTULA  . Tympanoplasty Left 12-23-2009  . Stapedectomy  1970'S  . Total hip arthroplasty Right 1992  . Excision benign cyst right breast    .  Abdominal hysterectomy  1950's    AND APPENDECTOMY  . Tonsillectomy  CHILD  . Coronary angioplasty with stent placement  08-26-2010  DR [facility]    PCI, BM STENT IN RCA  . Coronary angioplasty with stent placement  08-29-2010  DR [facility]    PCI, BM STENT IN PROXIMAL CIRCUMFLEX  . Release left carpal tunnel/ osteotomy left distal radius  11-10-2009  . Cataract extraction w/ intraocular lens  implant, bilateral    . Transthoracic echocardiogram  10-10-2011    NORMAL LV SIZE WITH MILD FOCAL BASAL SEPTAL HYPERTROPHY/ EF 55-60%/ NORMAL RV SIZE AND LVSF/ BIATRIAL ENLARGEMENT/ MILD TO MODERATE AI  &  TR  . Fistula plug N/A 06/21/2012    Procedure: insertion of FISTULA PLUG;  Surgeon: Leighton Ruff, MD;  Location: [place];  Service: General;  Laterality: N/A;    Current Outpatient Prescriptions  Medication Sig Dispense Refill  . acetaminophen (TYLENOL) 325 MG tablet Take 650 mg by mouth every 6 (six) hours as needed for mild pain.       Marland Kitchen aspirin 81 MG tablet Take 81 mg by mouth daily.       . beta carotene w/minerals (OCUVITE) tablet Take 1 tablet by mouth daily.      . metoprolol tartrate (LOPRESSOR) 25 MG tablet Take 25 mg by mouth 2 (two) times daily.   60 tablet  6  . Multiple Vitamin (MULTIVITAMIN) tablet Take 1 tablet by mouth daily.       Marland Kitchen NITROSTAT 0.4 MG SL tablet Place 0.4 mg under the tongue every 5 (five) minutes as needed for chest pain (MAX 3 TABLETS).       Vladimir Faster Glycol-Propyl Glycol (SYSTANE) 0.4-0.3 % GEL Apply to eye as directed.       . [DISCONTINUED] omeprazole (PRILOSEC) 40 MG capsule Take 40 mg by mouth as needed.       No current facility-administered medications for this visit.    Allergies  Allergen Reactions  . Effexor [Venlafaxine Hydrochloride] Other (See Comments)    UNKNOWN  . Sulfa Antibiotics Nausea Only  . Dicyclomine Other (See Comments)    Caused confusion  . Hydrocodone Nausea Only    History   Social History  . Marital Status: Widowed    Spouse Name: N/A    Number of Children: N/A  . Years of Education: N/A   Occupational History  . Not on file.   Social History Main Topics  . Smoking status: Never Smoker   . Smokeless tobacco: Never Used  . Alcohol Use: No  . Drug Use: No  . Sexual Activity: Not on file   Other Topics Concern  . Not on file   Social History Narrative  . No narrative on file    Family History  Problem Relation Age of Onset  . Kidney disease Mother   . Heart disease Father   . Cancer Brother     Review of Systems:  As stated in the HPI and otherwise negative.   BP 152/74  Pulse 65  Ht 5' 4.5" (1.638 m)  Wt 111 lb 9.6 oz (50.621 kg)  BMI 18.87 kg/m2  Physical Examination: General: Well  developed, well nourished, NAD HEENT: OP clear, mucus membranes moist SKIN: warm, dry. No rashes. Neuro: No focal deficits Musculoskeletal: Muscle strength 5/5 all ext Psychiatric: Mood and affect normal Neck: No JVD, no carotid bruits, no thyromegaly, no lymphadenopathy. Lungs:Clear bilaterally, no wheezes, rhonci, crackles Cardiovascular: Regular rate and rhythm. No murmurs,  gallops or rubs. Abdomen:Soft. Bowel sounds present. Non-tender.  Extremities: No lower extremity edema. Pulses are 2 + in the bilateral DP/PT.   EKG: Sinus, rate 63 bpm. 1st degree AV block. PVC. Poor R wave progression.   Assessment and Plan:   1. CAD: Stable. No chest pains. Lipids were at goal in 2013 and given age we are not checking anymore. She is not on a statin any longer given age. LV function has been normal.   2. Palpitations: Rare. Will continue metoprolol 25 mg po BID.   3. HTN: BP slightly elevated but given age, will not add meds. No changes.   4. Aortic insufficiency: Moderate by echo 2013. Will not repeat echo given her age. She is not a candidate for open heart surgery.

## 2013-10-31 NOTE — Patient Instructions (Signed)
Your physician wants you to follow-up in:  6 months. You will receive a reminder letter in the mail two months in advance. If you don't receive a letter, please call our office to schedule the follow-up appointment.   

## 2013-11-24 ENCOUNTER — Encounter (HOSPITAL_COMMUNITY): Payer: Self-pay | Admitting: Emergency Medicine

## 2013-11-24 ENCOUNTER — Emergency Department (HOSPITAL_COMMUNITY): Payer: Medicare Other

## 2013-11-24 ENCOUNTER — Emergency Department (HOSPITAL_COMMUNITY)
Admission: EM | Admit: 2013-11-24 | Discharge: 2013-11-24 | Disposition: A | Discharge status: Home / Self Care | Payer: Medicare Other | Attending: Emergency Medicine | Admitting: Emergency Medicine

## 2013-11-24 DIAGNOSIS — T07XXXA Unspecified multiple injuries, initial encounter: Secondary | ICD-10-CM

## 2013-11-24 DIAGNOSIS — R911 Solitary pulmonary nodule: Secondary | ICD-10-CM

## 2013-11-24 DIAGNOSIS — Z8639 Personal history of other endocrine, nutritional and metabolic disease: Secondary | ICD-10-CM | POA: Diagnosis not present

## 2013-11-24 DIAGNOSIS — I252 Old myocardial infarction: Secondary | ICD-10-CM | POA: Insufficient documentation

## 2013-11-24 DIAGNOSIS — Y9389 Activity, other specified: Secondary | ICD-10-CM | POA: Diagnosis not present

## 2013-11-24 DIAGNOSIS — Z8742 Personal history of other diseases of the female genital tract: Secondary | ICD-10-CM | POA: Insufficient documentation

## 2013-11-24 DIAGNOSIS — H9190 Unspecified hearing loss, unspecified ear: Secondary | ICD-10-CM | POA: Diagnosis not present

## 2013-11-24 DIAGNOSIS — M1388 Other specified arthritis, other site: Secondary | ICD-10-CM | POA: Diagnosis not present

## 2013-11-24 DIAGNOSIS — Z933 Colostomy status: Secondary | ICD-10-CM | POA: Insufficient documentation

## 2013-11-24 DIAGNOSIS — W01198A Fall on same level from slipping, tripping and stumbling with subsequent striking against other object, initial encounter: Secondary | ICD-10-CM | POA: Diagnosis not present

## 2013-11-24 DIAGNOSIS — I480 Paroxysmal atrial fibrillation: Secondary | ICD-10-CM | POA: Diagnosis not present

## 2013-11-24 DIAGNOSIS — I10 Essential (primary) hypertension: Secondary | ICD-10-CM | POA: Insufficient documentation

## 2013-11-24 DIAGNOSIS — Z9861 Coronary angioplasty status: Secondary | ICD-10-CM | POA: Insufficient documentation

## 2013-11-24 DIAGNOSIS — Z7982 Long term (current) use of aspirin: Secondary | ICD-10-CM | POA: Diagnosis not present

## 2013-11-24 DIAGNOSIS — S40012A Contusion of left shoulder, initial encounter: Secondary | ICD-10-CM | POA: Diagnosis not present

## 2013-11-24 DIAGNOSIS — M25412 Effusion, left shoulder: Secondary | ICD-10-CM

## 2013-11-24 DIAGNOSIS — W11XXXA Fall on and from ladder, initial encounter: Secondary | ICD-10-CM | POA: Diagnosis not present

## 2013-11-24 DIAGNOSIS — Z79899 Other long term (current) drug therapy: Secondary | ICD-10-CM | POA: Insufficient documentation

## 2013-11-24 DIAGNOSIS — S4992XA Unspecified injury of left shoulder and upper arm, initial encounter: Secondary | ICD-10-CM | POA: Diagnosis present

## 2013-11-24 DIAGNOSIS — S20219A Contusion of unspecified front wall of thorax, initial encounter: Secondary | ICD-10-CM | POA: Diagnosis not present

## 2013-11-24 DIAGNOSIS — Y9289 Other specified places as the place of occurrence of the external cause: Secondary | ICD-10-CM | POA: Insufficient documentation

## 2013-11-24 DIAGNOSIS — I2510 Atherosclerotic heart disease of native coronary artery without angina pectoris: Secondary | ICD-10-CM | POA: Insufficient documentation

## 2013-11-24 DIAGNOSIS — M25512 Pain in left shoulder: Secondary | ICD-10-CM

## 2013-11-24 DIAGNOSIS — Z85048 Personal history of other malignant neoplasm of rectum, rectosigmoid junction, and anus: Secondary | ICD-10-CM | POA: Insufficient documentation

## 2013-11-24 HISTORY — DX: Unspecified degenerated conditions of globe: H44.50

## 2013-11-24 NOTE — Discharge Instructions (Signed)
You may use your home tramadol for pain at night.  Contusion A contusion is a deep bruise. Contusions happen when an injury causes bleeding under the skin. Signs of bruising include pain, puffiness (swelling), and discolored skin. The contusion may turn blue, purple, or yellow. HOME CARE   Put ice on the injured area.  Put ice in a plastic bag.  Place a towel between your skin and the bag.  Leave the ice on for 15-20 minutes, 03-04 times a day.  Only take medicine as told by your doctor.  Rest the injured area.  If possible, raise (elevate) the injured area to lessen puffiness. GET HELP RIGHT AWAY IF:   You have more bruising or puffiness.  You have pain that is getting worse.  Your puffiness or pain is not helped by medicine. MAKE SURE YOU:   Understand these instructions.  Will watch your condition.  Will get help right away if you are not doing well or get worse. Document Released: 07/19/2007 Document Revised: 04/24/2011 Document Reviewed: 12/05/2010 [facility] Patient Information 2015 [place], [place]. This information is not intended to replace advice given to you by your health care provider. Make sure you discuss any questions you have with your health care provider.

## 2013-11-24 NOTE — ED Notes (Signed)
Pt c/o fall last week; pt c/o right arm pain and left arm pain into neck; pt sts pain in left eye x several days

## 2013-11-24 NOTE — ED Provider Notes (Signed)
CSN: [id number]     Arrival date & time 11/24/13  1444 History   First MD Initiated Contact with Patient 11/24/13 1803     Chief Complaint  Patient presents with  . Fall     HPI  She presents after a fall 6 days ago. She was standing on a two-step step ladder. She was attempting to access a cabinet above her refrigerator. And she was coming down she tried a little bump to the handles of the Above the refrigerator. When the door open she lost her balance off of the first step. She fell to the floor. She struck her right elbow against the microwave station. She has no pain at the elbow. She has some pain in the shoulder it is been present since her fall. Also reports a left-sided headache is retro-orbital since the fall. No loss of consciousness. No bleeding from ears nose or mouth. No confusion. Went to her orthopedist, Dr. Ann Lions today for evaluation. Mentioned a headache. She was referred here.  Past Medical History  Diagnosis Date  . Hyperlipidemia   . Hypertension   . 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 History of ST elevation myocardial infarction (STEMI) 08-26-2010-- INFERIOR WALL    S/P PCI  BMS IN RCA AND PROX. CX  . S/P coronary artery stent placement 08/2010    X2  BM  . CAD (coronary artery disease) CARDIOLOGIST-- DR Angelena Form    a. s/p INF STEMI 7/12: tx with BMS to RCA;  b. cath 08/26/10: pLAD 30%, mLAD 50%, D1 40%, pCFX 95%, mRCA occluded;   c. staged PCI of pCFX with BMS;   d. echo 7/12:   EF 60-65%, mild RAE, mild to moderate AI, mild MR, moderate TR, RVE, PASP 47  . PAF (paroxysmal atrial fibrillation)   . Pulmonary nodules BENIGN  PER CT  10-12-2010  . Dyspnea   . Arthritis     FINGERS   . Osteopenia   . Impaired hearing BILATERAL HEARING AIDS  . Heart palpitations PAC'S AND SVT RUN'S  PER CARDIOLOGIST NOTE  . Rectal Cancer 08/2010    adenocarcinoma   S/P PARTIAL PROCTECTOMY (NO CHEMO OR RADIATION)  . S/P colostomy SECONDARY TO  RECTOVAGINAL FISTULA  . First degree heart block   . Complication of anesthesia PT STATES "MADE HER FEEL CRAZY"  . PONV (postoperative nausea and vomiting)   . Degeneration of eye     left eye cornea   Past Surgical History  Procedure Laterality Date  . Knee surgery Right 1996  . Flexible sigmoidoscopy N/A 04/02/2012    Procedure: FLEXIBLE SIGMOIDOSCOPY;  Surgeon: Leighton Ruff, MD;  Location: [facility];  Service: Endoscopy;  Laterality: N/A;  . Partial proctectomy by tem  11-17-2010    RECTAL CANCER  . Laproscopy lysis adhesions/ drainage of pelvic abscess/ diverting loop sigmoid colectomy  11-22-2010    POST OP RECTOVAGINAL FISTULA  . Tympanoplasty Left 12-23-2009  . Stapedectomy  1970'S  . Total hip arthroplasty Right 1992  . Excision benign cyst right breast    . Abdominal hysterectomy  1950's    AND APPENDECTOMY  . Tonsillectomy  CHILD  . Coronary angioplasty with stent placement  08-26-2010  DR [facility]    PCI, BM STENT IN RCA  . Coronary angioplasty with stent placement  08-29-2010  DR [facility]    PCI, BM STENT IN PROXIMAL CIRCUMFLEX  . Release left carpal tunnel/ osteotomy left distal radius  11-10-2009  . Cataract extraction w/  intraocular lens  implant, bilateral    . Transthoracic echocardiogram  10-10-2011    NORMAL LV SIZE WITH MILD FOCAL BASAL SEPTAL HYPERTROPHY/ EF 55-60%/ NORMAL RV SIZE AND LVSF/ BIATRIAL ENLARGEMENT/ MILD TO MODERATE AI  &  TR  . Fistula plug N/A 06/21/2012    Procedure: insertion of FISTULA PLUG;  Surgeon: Leighton Ruff, MD;  Location: [facility];  Service: General;  Laterality: N/A;   Family History  Problem Relation Age of Onset  . Kidney disease Mother   . Heart disease Father   . Cancer Brother    History  Substance Use Topics  . Smoking status: Never Smoker   . Smokeless tobacco: Never Used  . Alcohol Use: No   OB History   Grav Para Term Preterm Abortions TAB SAB Ect Mult Living                 Review of  Systems  Constitutional: Negative for fever, chills, diaphoresis, appetite change and fatigue.  HENT: Negative for mouth sores, sore throat and trouble swallowing.   Eyes: Negative for visual disturbance.  Respiratory: Negative for cough, chest tightness, shortness of breath and wheezing.   Cardiovascular: Negative for chest pain.  Gastrointestinal: Negative for nausea, vomiting, abdominal pain, diarrhea and abdominal distention.  Endocrine: Negative for polydipsia, polyphagia and polyuria.  Genitourinary: Negative for dysuria, frequency and hematuria.  Musculoskeletal: Positive for arthralgias and myalgias. Negative for gait problem.       Pain left shoulder, left anterior chest, left anterior lateral neck. Denies posterior neck pain.  Skin: Negative for color change, pallor and rash.  Neurological: Positive for headaches. Negative for dizziness, syncope and light-headedness.  Hematological: Does not bruise/bleed easily.  Psychiatric/Behavioral: Negative for behavioral problems and confusion.      Allergies  Effexor; Sulfa antibiotics; Dicyclomine; and Hydrocodone  Home Medications   Prior to Admission medications   Medication Sig Start Date End Date Taking? Authorizing Provider  acetaminophen (TYLENOL) 325 MG tablet Take 650 mg by mouth every 6 (six) hours as needed for mild pain.    Yes Historical Provider, MD  aspirin 81 MG tablet Take 81 mg by mouth daily.    Yes Historical Provider, MD  beta carotene w/minerals (OCUVITE) tablet Take 1 tablet by mouth daily.   Yes Historical Provider, MD  metoprolol tartrate (LOPRESSOR) 25 MG tablet Take 25 mg by mouth 2 (two) times daily.  07/22/13  Yes Jettie Booze, MD  Multiple Vitamin (MULTIVITAMIN) tablet Take 1 tablet by mouth daily.    Yes Historical Provider, MD  Polyethyl Glycol-Propyl Glycol (SYSTANE) 0.4-0.3 % GEL Apply 1 drop to eye at bedtime.    Yes Historical Provider, MD  Probiotic Product (PROBIOTIC DAILY) CAPS Take 1  capsule by mouth daily.   Yes Historical Provider, MD  traMADol (ULTRAM) 50 MG tablet Take 50 mg by mouth every 12 (twelve) hours as needed for moderate pain.   Yes Historical Provider, MD  nitroGLYCERIN (NITROSTAT) 0.4 MG SL tablet Place 1 tablet (0.4 mg total) under the tongue every 5 (five) minutes as needed for chest pain (MAX 3 TABLETS). 10/31/13   Burnell Blanks, MD   BP 154/79  Pulse 81  Temp(Src) 97.7 F (36.5 C) (Oral)  Resp 18  SpO2 97% Physical Exam  Constitutional: She is oriented to person, place, and time. She appears well-developed and well-nourished. No distress.  Small history female. Awake and alert. Hard of hearing and wears hearing. Oriented lucid.  HENT:  Head:  Normocephalic.  Normal HEENT exam. No blood over the TMs, mastoids, or from ears nose or mouth. Globe appears normal. Full range of motion. No proptosis, or enophthalmos. No rash or vesicles. No obvious contusion or tenderness in the retro-orbital scalp and skull.  Eyes: Conjunctivae are normal. Pupils are equal, round, and reactive to light. No scleral icterus.  Neck: Normal range of motion. Neck supple. No thyromegaly present.  Cardiovascular: Normal rate and regular rhythm.  Exam reveals no gallop and no friction rub.   No murmur heard. Pulmonary/Chest: Effort normal and breath sounds normal. No respiratory distress. She has no wheezes. She has no rales.  Abdominal: Soft. Bowel sounds are normal. She exhibits no distension. There is no tenderness. There is no rebound.  Musculoskeletal: Normal range of motion.       Arms: Neurological: She is alert and oriented to person, place, and time.  Skin: Skin is warm and dry. No rash noted.  Psychiatric: She has a normal mood and affect. Her behavior is normal.    ED Course  Procedures (including critical care time) Labs Review Labs Reviewed - No data to display  Imaging Review No results found.   EKG Interpretation None      MDM   Final  diagnoses:  Multiple contusions        Tanna Furry, MD 11/27/13 [id number]

## 2014-03-12 ENCOUNTER — Ambulatory Visit (INDEPENDENT_AMBULATORY_CARE_PROVIDER_SITE_OTHER)
Admission: RE | Admit: 2014-03-12 | Discharge: 2014-03-12 | Disposition: A | Discharge status: Home / Self Care | Payer: Medicare Other | Source: Ambulatory Visit | Attending: Internal Medicine | Admitting: Internal Medicine

## 2014-03-12 ENCOUNTER — Ambulatory Visit (INDEPENDENT_AMBULATORY_CARE_PROVIDER_SITE_OTHER): Payer: Medicare Other | Admitting: Internal Medicine

## 2014-03-12 ENCOUNTER — Encounter: Payer: Self-pay | Admitting: Internal Medicine

## 2014-03-12 ENCOUNTER — Other Ambulatory Visit: Payer: Self-pay | Admitting: Cardiovascular Disease

## 2014-03-12 VITALS — BP 118/70 | HR 66 | Ht 64.0 in | Wt 111.2 lb

## 2014-03-12 DIAGNOSIS — R05 Cough: Secondary | ICD-10-CM

## 2014-03-12 DIAGNOSIS — R911 Solitary pulmonary nodule: Secondary | ICD-10-CM

## 2014-03-12 DIAGNOSIS — Z201 Contact with and (suspected) exposure to tuberculosis: Secondary | ICD-10-CM

## 2014-03-12 DIAGNOSIS — R059 Cough, unspecified: Secondary | ICD-10-CM

## 2014-03-12 NOTE — Patient Instructions (Signed)
Order- CXR   Dx lung nodule, cough  We will be happy to see you again if you need [place].

## 2014-03-12 NOTE — Progress Notes (Signed)
09/04/13- 89 yoF never smoker COMPLAINS OF:  Referred by Dr. Claris Gower per CXR done 07/02/13 pt has films.  CXR 07/02/13 dark technique- compared with prior 2007, note calcified nodular density LLL which may be parenchymal nodule or calcium in rib cartilage. She has been complaining of feeling weak and tired for some months., with almost incidental mild intermittent cough/ shortness of breath. CXR done as part of assessment. Hx MI 2012/ stent but denies swelling, palpitation, chest pain. Hx rectal cancer surgery 2012 with fistula and colostomy. 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 She reports a church trip to visit a sanatorium long ago. Widowed, living alone.  03/12/14- 89 yoF never smoker followed for question of lung nodule, cough, complicated by hx CAD/ stent, remote + PPD, Rectal CA/ L colostomy FOLLOWS FOR: ? CXR today to compare(will need to have done). PT states her breathing is doing okay but has noticed if speaking with someone in person or via phne she gets shortwinded. Very occasional hacking cough CXR 11/24/13 IMPRESSION: Hyperexpanded lungs and bronchitic change without acute cardiopulmonary disease. Electronically Signed  By: Sandi Mariscal M.D.  On: 11/24/2013 20:19  ROS-see HPI Constitutional:   No-   weight loss, night sweats, fevers, chills, fatigue, lassitude. HEENT:   No-  headaches, difficulty swallowing, tooth/dental problems, sore throat,       +sneezing,No- itching, ear ache, nasal congestion, post nasal drip,  CV:  No-   chest pain, orthopnea, PND, swelling in lower extremities, anasarca,                                  dizziness, +palpitations Resp: No-   shortness of breath with exertion or at rest.              No-   productive cough,  No non-productive cough,  No- coughing up of blood.              No-   change in color of mucus.  No- wheezing.   Skin: No-   rash or lesions. GI:  No-   heartburn, indigestion, abdominal pain, nausea,  vomiting, GU: . MS:  No-   joint pain or swelling.   Neuro-     nothing unusual Psych:  No- change in mood or affect. No depression or anxiety.  No memory loss.  OBJ- Physical Exam  Frail elderly lady General- Alert, Oriented, Affect-appropriate, Distress- none acute Skin- rash-none, lesions- none, excoriation- none Lymphadenopathy- none Head- atraumatic            Eyes- Gross vision intact, PERRLA, conjunctivae and secretions clear            Ears- +Hard of hearing            Nose- Clear, no-Septal dev, mucus, polyps, erosion, perforation             Throat- Mallampati II , mucosa clear , drainage- none, tonsils- atrophic Neck- flexible , trachea midline, no stridor , thyroid nl, carotid no bruit Chest - symmetrical excursion , unlabored           Heart/CV- RRR , no murmur , no gallop  , no rub, nl s1 s2                           - JVD- none , edema- none, stasis changes- none, varices- none  Lung- clear to P&A, wheeze- none, cough+dry , dullness-none, rub- none           Chest wall- +kyphosis Abd-  +L colostomy Br/ Gen/ Rectal- Not done, not indicated Extrem- cyanosis- none, clubbing, none, atrophy- none, strength- nl Neuro- grossly intact to observation

## 2014-03-13 ENCOUNTER — Other Ambulatory Visit: Payer: Self-pay

## 2014-03-13 MED ORDER — METOPROLOL TARTRATE 25 MG PO TABS: 25.0000 mg | ORAL_TABLET | Freq: Two times a day (BID) | ORAL | Status: DC

## 2014-03-15 NOTE — Assessment & Plan Note (Signed)
We think this is benign, probably a bone nodule requiring no attention. Plan-chest x-ray for comparable technique. Unless there is a concern from that, we will see her again as needed

## 2014-03-15 NOTE — Assessment & Plan Note (Signed)
Historical. No evidence of active disease.

## 2014-04-15 ENCOUNTER — Encounter: Payer: Self-pay | Admitting: Cardiovascular Disease

## 2014-04-15 ENCOUNTER — Ambulatory Visit (INDEPENDENT_AMBULATORY_CARE_PROVIDER_SITE_OTHER): Payer: Medicare Other | Admitting: Cardiovascular Disease

## 2014-04-15 VITALS — BP 150/72 | HR 76 | Ht 64.0 in | Wt 113.0 lb

## 2014-04-15 DIAGNOSIS — I351 Nonrheumatic aortic (valve) insufficiency: Secondary | ICD-10-CM

## 2014-04-15 DIAGNOSIS — I2510 Atherosclerotic heart disease of native coronary artery without angina pectoris: Secondary | ICD-10-CM

## 2014-04-15 DIAGNOSIS — I10 Essential (primary) hypertension: Secondary | ICD-10-CM

## 2014-04-15 NOTE — Progress Notes (Signed)
History of Present Illness: 81 yo WF with a history of CAD, moderate AI, mild MR, rectal cancer, PAF here today for cardiac follow up. She was admitted 08/2010 with an inferior STEMI. Emergent cath done with placement of a BMS to the RCA for 100% occlusion. She was noted to have a high grade proximal CFX 95%. She was brought back to the cath lab a couple days later for staged PCI. She was treated with BMS to the CFX. Echo 08/27/11: EF 60-65%, mild RAE, mild to moderate AI, mild MR, moderate TR, RVE, PASP 47. She has undergone partial proctectomy with loop colostomy in 11/2010. She developed an abscess with fistula. She had issues with persistent rectovaginal fistula. I saw her 10/02/11 and she had c/o fatigue, dyspnea and palpitations. I arranged an echo which showed normal LV size and function with mild to moderate AI, moderate TR. 48 hour holter monitor showed NSR with PACs and runs of SVT with rare PVCs. No VT or atrial fibrillation noted. I saw her in January 2015 and she was still having palpitations. Metoprolol lowered to 12.5 mg po BID in primary care due to hypotension.    She is here today for follow up. She has rare palpitations. No chest pain or SOB. She still has drainage from her fistula. She has been trying to keep her po intake up.    Primary Care Physician: Claris Gower   Past Medical History  Diagnosis Date  . Hyperlipidemia   . Hypertension   . 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 History of ST elevation myocardial infarction (STEMI) 08-26-2010-- INFERIOR WALL    S/P PCI  BMS IN RCA AND PROX. CX  . S/P coronary artery stent placement 08/2010    X2  BM  . CAD (coronary artery disease) CARDIOLOGIST-- DR Angelena Form    a. s/p INF STEMI 7/12: tx with BMS to RCA;  b. cath 08/26/10: pLAD 30%, mLAD 50%, D1 40%, pCFX 95%, mRCA occluded;   c. staged PCI of pCFX with BMS;   d. echo 7/12:   EF 60-65%, mild RAE, mild to moderate AI, mild MR, moderate TR, RVE, PASP  47  . PAF (paroxysmal atrial fibrillation)   . Pulmonary nodules BENIGN  PER CT  10-12-2010  . Dyspnea   . Arthritis     FINGERS   . Osteopenia   . Impaired hearing BILATERAL HEARING AIDS  . Heart palpitations PAC'S AND SVT RUN'S  PER CARDIOLOGIST NOTE  . Rectal Cancer 08/2010    adenocarcinoma   S/P PARTIAL PROCTECTOMY (NO CHEMO OR RADIATION)  . S/P colostomy SECONDARY TO RECTOVAGINAL FISTULA  . First degree heart block   . Complication of anesthesia PT STATES "MADE HER FEEL CRAZY"  . PONV (postoperative nausea and vomiting)   . Degeneration of eye     left eye cornea    Past Surgical History  Procedure Laterality Date  . Knee surgery Right 1996  . Flexible sigmoidoscopy N/A 04/02/2012    Procedure: FLEXIBLE SIGMOIDOSCOPY;  Surgeon: Leighton Ruff, MD;  Location: [facility];  Service: Endoscopy;  Laterality: N/A;  . Partial proctectomy by tem  11-17-2010    RECTAL CANCER  . Laproscopy lysis adhesions/ drainage of pelvic abscess/ diverting loop sigmoid colectomy  11-22-2010    POST OP RECTOVAGINAL FISTULA  . Tympanoplasty Left 12-23-2009  . Stapedectomy  1970'S  . Total hip arthroplasty Right 1992  . Excision benign cyst right breast    . Abdominal  hysterectomy  1950's    AND APPENDECTOMY  . Tonsillectomy  CHILD  . Coronary angioplasty with stent placement  08-26-2010  DR [facility]    PCI, BM STENT IN RCA  . Coronary angioplasty with stent placement  08-29-2010  DR [facility]    PCI, BM STENT IN PROXIMAL CIRCUMFLEX  . Release left carpal tunnel/ osteotomy left distal radius  11-10-2009  . Cataract extraction w/ intraocular lens  implant, bilateral    . Transthoracic echocardiogram  10-10-2011    NORMAL LV SIZE WITH MILD FOCAL BASAL SEPTAL HYPERTROPHY/ EF 55-60%/ NORMAL RV SIZE AND LVSF/ BIATRIAL ENLARGEMENT/ MILD TO MODERATE AI  &  TR  . Fistula plug N/A 06/21/2012    Procedure: insertion of FISTULA PLUG;  Surgeon: Leighton Ruff, MD;  Location: [facility];   Service: General;  Laterality: N/A;    Current Outpatient Prescriptions  Medication Sig Dispense Refill  . acetaminophen (TYLENOL) 325 MG tablet Take 650 mg by mouth every 6 (six) hours as needed for mild pain.     Marland Kitchen aspirin 81 MG tablet Take 81 mg by mouth daily.     . beta carotene w/minerals (OCUVITE) tablet Take 1 tablet by mouth daily.    . metoprolol tartrate (LOPRESSOR) 25 MG tablet Take 1 tablet (25 mg total) by mouth 2 (two) times daily. (Patient taking differently: Take 25 mg by mouth 2 (two) times daily. Take 1/2 tablet twice daily) 60 tablet 3  . Multiple Vitamin (MULTIVITAMIN) tablet Take 1 tablet by mouth daily.     . nitroGLYCERIN (NITROSTAT) 0.4 MG SL tablet Place 1 tablet (0.4 mg total) under the tongue every 5 (five) minutes as needed for chest pain (MAX 3 TABLETS). 25 tablet 6  . Polyethyl Glycol-Propyl Glycol (SYSTANE) 0.4-0.3 % GEL Apply 1 drop to eye at bedtime.     . traMADol (ULTRAM) 50 MG tablet Take 50 mg by mouth every 12 (twelve) hours as needed for moderate pain.    . [DISCONTINUED] omeprazole (PRILOSEC) 40 MG capsule Take 40 mg by mouth as needed.     No current facility-administered medications for this visit.    Allergies  Allergen Reactions  . Effexor [Venlafaxine Hydrochloride] Other (See Comments)    UNKNOWN  . Penicillins Shortness Of Breath  . Sulfa Antibiotics Nausea Only  . Dicyclomine Other (See Comments)    Caused confusion  . Hydrocodone Nausea Only    History   Social History  . Marital Status: Widowed    Spouse Name: N/A  . Number of Children: N/A  . Years of Education: N/A   Occupational History  . Not on file.   Social History Main Topics  . Smoking status: Never Smoker   . Smokeless tobacco: Never Used  . Alcohol Use: No  . Drug Use: No  . Sexual Activity: Not on file   Other Topics Concern  . Not on file   Social History Narrative    Family History  Problem Relation Age of Onset  . Kidney disease Mother   . Heart  disease Father   . Cancer Brother     Review of Systems:  As stated in the HPI and otherwise negative.   BP 150/72 mmHg  Pulse 76  Ht 5\' 4"  (1.626 m)  Wt 113 lb (51.256 kg)  BMI 19.39 kg/m2  Physical Examination: General: Well developed, well nourished, NAD HEENT: OP clear, mucus membranes moist SKIN: warm, dry. No rashes. Neuro: No focal deficits Musculoskeletal: Muscle strength 5/5 all  ext Psychiatric: Mood and affect normal Neck: No JVD, no carotid bruits, no thyromegaly, no lymphadenopathy. Lungs:Clear bilaterally, no wheezes, rhonci, crackles Cardiovascular: Regular rate and rhythm. No murmurs, gallops or rubs. Abdomen:Soft. Bowel sounds present. Non-tender.  Extremities: No lower extremity edema. Pulses are 2 + in the bilateral DP/PT.  Assessment and Plan:   1. CAD: Stable. No chest pains. Lipids were at goal in 2013 and given age we are not checking anymore. She is not on a statin any longer given age. LV function has been normal.   2. Palpitations/SVT: Rare palpitations. Will continue metoprolol 12.5 mg po BID.   3. HTN: BP slightly elevated today but was lower in primary care office and metoprolol dose was reduced. No changes.   4. Aortic insufficiency: Moderate by echo 2013. We had not planned on repeating her echocardiogram given her age but now with dyspnea, she would like to repeat it and assess her valve disease.

## 2014-04-15 NOTE — Patient Instructions (Signed)

## 2014-04-23 ENCOUNTER — Ambulatory Visit (HOSPITAL_COMMUNITY): Payer: Medicare Other | Attending: Cardiology | Admitting: Radiology

## 2014-04-23 DIAGNOSIS — I351 Nonrheumatic aortic (valve) insufficiency: Secondary | ICD-10-CM | POA: Insufficient documentation

## 2014-04-23 DIAGNOSIS — E785 Hyperlipidemia, unspecified: Secondary | ICD-10-CM | POA: Diagnosis not present

## 2014-04-23 DIAGNOSIS — I10 Essential (primary) hypertension: Secondary | ICD-10-CM | POA: Insufficient documentation

## 2014-04-23 NOTE — Progress Notes (Signed)
Echocardiogram performed.  

## 2014-06-23 ENCOUNTER — Telehealth: Payer: Self-pay | Admitting: *Deleted

## 2014-06-23 NOTE — Telephone Encounter (Signed)
On 06-23-14 fax medical records to [facility] , it was consult note

## 2014-09-09 ENCOUNTER — Encounter (HOSPITAL_COMMUNITY): Payer: Self-pay | Admitting: Emergency Medicine

## 2014-09-09 ENCOUNTER — Emergency Department (HOSPITAL_COMMUNITY): Payer: Medicare Other

## 2014-09-09 ENCOUNTER — Telehealth: Payer: Self-pay | Admitting: Cardiovascular Disease

## 2014-09-09 ENCOUNTER — Emergency Department (HOSPITAL_COMMUNITY)
Admission: EM | Admit: 2014-09-09 | Discharge: 2014-09-10 | Disposition: A | Discharge status: Home / Self Care | Payer: Medicare Other | Attending: Emergency Medicine | Admitting: Emergency Medicine

## 2014-09-09 DIAGNOSIS — I480 Paroxysmal atrial fibrillation: Secondary | ICD-10-CM | POA: Insufficient documentation

## 2014-09-09 DIAGNOSIS — Z85048 Personal history of other malignant neoplasm of rectum, rectosigmoid junction, and anus: Secondary | ICD-10-CM | POA: Insufficient documentation

## 2014-09-09 DIAGNOSIS — Z87448 Personal history of other diseases of urinary system: Secondary | ICD-10-CM | POA: Diagnosis not present

## 2014-09-09 DIAGNOSIS — I10 Essential (primary) hypertension: Secondary | ICD-10-CM | POA: Diagnosis not present

## 2014-09-09 DIAGNOSIS — Z7982 Long term (current) use of aspirin: Secondary | ICD-10-CM | POA: Insufficient documentation

## 2014-09-09 DIAGNOSIS — M1990 Unspecified osteoarthritis, unspecified site: Secondary | ICD-10-CM | POA: Insufficient documentation

## 2014-09-09 DIAGNOSIS — Z79899 Other long term (current) drug therapy: Secondary | ICD-10-CM | POA: Insufficient documentation

## 2014-09-09 DIAGNOSIS — H9190 Unspecified hearing loss, unspecified ear: Secondary | ICD-10-CM | POA: Diagnosis not present

## 2014-09-09 DIAGNOSIS — R0602 Shortness of breath: Secondary | ICD-10-CM | POA: Insufficient documentation

## 2014-09-09 DIAGNOSIS — I252 Old myocardial infarction: Secondary | ICD-10-CM | POA: Insufficient documentation

## 2014-09-09 DIAGNOSIS — Z880 Allergy status to penicillin: Secondary | ICD-10-CM | POA: Diagnosis not present

## 2014-09-09 DIAGNOSIS — Z933 Colostomy status: Secondary | ICD-10-CM | POA: Diagnosis not present

## 2014-09-09 DIAGNOSIS — I2510 Atherosclerotic heart disease of native coronary artery without angina pectoris: Secondary | ICD-10-CM | POA: Diagnosis not present

## 2014-09-09 DIAGNOSIS — Z9861 Coronary angioplasty status: Secondary | ICD-10-CM | POA: Diagnosis not present

## 2014-09-09 LAB — URINALYSIS, ROUTINE W REFLEX MICROSCOPIC
Bilirubin Urine: NEGATIVE
GLUCOSE, UA: NEGATIVE mg/dL
Hgb urine dipstick: NEGATIVE
Ketones, ur: NEGATIVE mg/dL
Leukocytes, UA: NEGATIVE
NITRITE: NEGATIVE
Protein, ur: NEGATIVE mg/dL
SPECIFIC GRAVITY, URINE: 1.006 (ref 1.005–1.030)
Urobilinogen, UA: 0.2 mg/dL (ref 0.0–1.0)
pH: 6 (ref 5.0–8.0)

## 2014-09-09 LAB — BASIC METABOLIC PANEL
ANION GAP: 10 (ref 5–15)
BUN: 16 mg/dL (ref 6–20)
CO2: 25 mmol/L (ref 22–32)
Calcium: 9.5 mg/dL (ref 8.9–10.3)
Chloride: 100 mmol/L — ABNORMAL LOW (ref 101–111)
Creatinine, Ser: 0.94 mg/dL (ref 0.44–1.00)
GFR calc Af Amer: 57 mL/min — ABNORMAL LOW (ref >60)
GFR calc non Af Amer: 50 mL/min — ABNORMAL LOW (ref >60)
Glucose, Bld: 91 mg/dL (ref 65–99)
Potassium: 4.5 mmol/L (ref 3.5–5.1)
Sodium: 135 mmol/L (ref 135–145)

## 2014-09-09 LAB — CBC
HCT: 38 % (ref 36.0–46.0)
HEMOGLOBIN: 12.8 g/dL (ref 12.0–15.0)
MCH: 31.5 pg (ref 26.0–34.0)
MCHC: 33.7 g/dL (ref 30.0–36.0)
MCV: 93.6 fL (ref 78.0–100.0)
PLATELETS: 192 10*3/uL (ref 150–400)
RBC: 4.06 MIL/uL (ref 3.87–5.11)
RDW: 12.5 % (ref 11.5–15.5)
WBC: 7.3 10*3/uL (ref 4.0–10.5)

## 2014-09-09 LAB — I-STAT TROPONIN, ED: Troponin i, poc: 0 ng/mL (ref 0.00–0.08)

## 2014-09-09 LAB — TROPONIN I

## 2014-09-09 LAB — D-DIMER, QUANTITATIVE: D-Dimer, Quant: 1.27 ug/mL-FEU — ABNORMAL HIGH (ref 0.00–0.48)

## 2014-09-09 LAB — BRAIN NATRIURETIC PEPTIDE: B Natriuretic Peptide: 140.5 pg/mL — ABNORMAL HIGH (ref 0.0–100.0)

## 2014-09-09 MED ORDER — IOHEXOL 350 MG/ML SOLN
100.0000 mL | Freq: Once | INTRAVENOUS | Status: AC | PRN
  Administered 2014-09-09: 100 mL via INTRAVENOUS

## 2014-09-09 NOTE — Telephone Encounter (Signed)
Pt called as she is experiencing SOB that started several days ago and has gotten worse starting yesterday. Pt stated she has not been outside other than running our this morning to take her car to be inspected. Pt stated that yesterday she took Tramadol for her pain and felt dizzy, sleepy, and confused. Pt stated that today that is better and she is not having dizziness or confusion with the SOB, as it has been more of an issue today than the past few days. Pt has experienced weakness over the past week as she has had GI issues and her PCP started her on a probiotic and told her to increase her fluids and decrease her Lopressor to 12.5 mg (half tablet) at bedtime. Pt stated that her BP 131/57 with HR of 82. Pt stated that she has some swelling in her feet that goes away at night.  Noticed pt is SOB in talking to her and stated that it is best for her to go to the ER to be evaluated.  Pt started to get upset as she does not want to go to the ED and would really like an MD's opinion on what she can do.  Shared pt concerns with DOD, Turner, agreed that pt needs to be evaluated in ED to assess what is going on.  Pt called back to tell her and she became very upset and started crying on the phone that she dose not want to go to the ED. Asked if there was someone that could take her and she stated her neighbor or niece could take her but she really did not want to go.  Pt asked if she could wait and see how she feels tonight and then decide.  Told pt it is best that she go ahead and go to the ED or at least the Urgent Care to be evaluated and not to wait.  Pt stated that she will discuss with her niece then decide. Pt educated that if she get worse she will need to call 911 or go to the ED to be evaluated, pt verbalized understanding and no additional questions at this time.

## 2014-09-09 NOTE — Telephone Encounter (Signed)
New message      Pt c/o Shortness Of Breath: STAT if SOB developed within the last 24 hours or pt is noticeably SOB on the phone  1. Are you currently SOB (can you hear that pt is SOB on the phone)? Yes----can hear it when she talks  2. How long have you been experiencing SOB? Couple of days  3. Are you SOB when sitting or when up moving around? Mostly all of the time  4. Are you currently experiencing any other symptoms? Feeling weak

## 2014-09-09 NOTE — ED Notes (Signed)
Pt had 324 of asa before ems arrived

## 2014-09-09 NOTE — ED Notes (Signed)
Per EMS: shortness of breath and "not feeling right" x 2 days.  Was doing bills today when she the shortness of breath was at its worse and called her daughter who called EMS.   Denies cp, n/v, or any other associated symptoms.  States she has not felt right since they decreased her metropol 2 months ago and feels tired.  Was hypertensive initially with EMS, systolic in the 876O.  Lung sounds were diminished on the right.

## 2014-09-09 NOTE — ED Provider Notes (Signed)
CSN: [id number]     Arrival date & time 09/09/14  1751 History   First MD Initiated Contact with Patient 09/09/14 1813     Chief Complaint  Patient presents with  . Shortness of Breath   (Consider location/radiation/quality/duration/timing/severity/associated sxs/prior Treatment) HPI Patient is a 81 year old female with a history of STEMI, paroxysmal A. fib, known pulmonary nodules, rectovaginal fistula status post colostomy presented today for increasing shortness of breath. Patient reports over the last several months she has been increasingly fatigued. Over the past week she has been becoming increasing short of breath to the point today where she had difficulty speaking. Arrival EMS found no hypoxia however. Patient denied any chest pain, palpitations, near syncope, falls or striking her head. On arrival to the emergency department patient reports alleviation of her symptoms. Denies abdominal pain, melena or dysuria.  Past Medical History  Diagnosis Date  . Hyperlipidemia   . Hypertension   . 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 History of ST elevation myocardial infarction (STEMI) 08-26-2010-- INFERIOR WALL    S/P PCI  BMS IN RCA AND PROX. CX  . S/P coronary artery stent placement 08/2010    X2  BM  . CAD (coronary artery disease) CARDIOLOGIST-- DR Angelena Form    a. s/p INF STEMI 7/12: tx with BMS to RCA;  b. cath 08/26/10: pLAD 30%, mLAD 50%, D1 40%, pCFX 95%, mRCA occluded;   c. staged PCI of pCFX with BMS;   d. echo 7/12:   EF 60-65%, mild RAE, mild to moderate AI, mild MR, moderate TR, RVE, PASP 47  . PAF (paroxysmal atrial fibrillation)   . Pulmonary nodules BENIGN  PER CT  10-12-2010  . Dyspnea   . Arthritis     FINGERS   . Osteopenia   . Impaired hearing BILATERAL HEARING AIDS  . Heart palpitations PAC'S AND SVT RUN'S  PER CARDIOLOGIST NOTE  . Rectal Cancer 08/2010    adenocarcinoma   S/P PARTIAL PROCTECTOMY (NO CHEMO OR RADIATION)  . S/P  colostomy SECONDARY TO RECTOVAGINAL FISTULA  . First degree heart block   . Complication of anesthesia PT STATES "MADE HER FEEL CRAZY"  . PONV (postoperative nausea and vomiting)   . Degeneration of eye     left eye cornea   Past Surgical History  Procedure Laterality Date  . Knee surgery Right 1996  . Flexible sigmoidoscopy N/A 04/02/2012    Procedure: FLEXIBLE SIGMOIDOSCOPY;  Surgeon: Leighton Ruff, MD;  Location: [facility];  Service: Endoscopy;  Laterality: N/A;  . Partial proctectomy by tem  11-17-2010    RECTAL CANCER  . Laproscopy lysis adhesions/ drainage of pelvic abscess/ diverting loop sigmoid colectomy  11-22-2010    POST OP RECTOVAGINAL FISTULA  . Tympanoplasty Left 12-23-2009  . Stapedectomy  1970'S  . Total hip arthroplasty Right 1992  . Excision benign cyst right breast    . Abdominal hysterectomy  1950's    AND APPENDECTOMY  . Tonsillectomy  CHILD  . Coronary angioplasty with stent placement  08-26-2010  DR [facility]    PCI, BM STENT IN RCA  . Coronary angioplasty with stent placement  08-29-2010  DR [facility]    PCI, BM STENT IN PROXIMAL CIRCUMFLEX  . Release left carpal tunnel/ osteotomy left distal radius  11-10-2009  . Cataract extraction w/ intraocular lens  implant, bilateral    . Transthoracic echocardiogram  10-10-2011    NORMAL LV SIZE WITH MILD FOCAL BASAL SEPTAL HYPERTROPHY/ EF 55-60%/ NORMAL RV SIZE  AND LVSF/ BIATRIAL ENLARGEMENT/ MILD TO MODERATE AI  &  TR  . Fistula plug N/A 06/21/2012    Procedure: insertion of FISTULA PLUG;  Surgeon: Leighton Ruff, MD;  Location: [place];  Service: General;  Laterality: N/A;   Family History  Problem Relation Age of Onset  . Kidney disease Mother   . Heart disease Father   . Cancer Brother    History  Substance Use Topics  . Smoking status: Never Smoker   . Smokeless tobacco: Never Used  . Alcohol Use: No   OB History    No data available     Review of Systems  Constitutional:  Negative for fever and chills.  HENT: Negative for congestion and sore throat.   Eyes: Negative for pain.  Respiratory: Positive for shortness of breath. Negative for cough, choking, chest tightness, wheezing and stridor.   Cardiovascular: Negative for chest pain, palpitations and leg swelling.  Gastrointestinal: Negative for nausea, vomiting, abdominal pain and diarrhea.  Genitourinary: Negative for dysuria and flank pain.  Musculoskeletal: Negative for back pain and neck pain.  Skin: Negative for rash.  Allergic/Immunologic: Negative.   Neurological: Negative for dizziness and light-headedness.  Psychiatric/Behavioral: Negative for confusion.    Allergies  Effexor; Penicillins; Sulfa antibiotics; Dicyclomine; and Hydrocodone  Home Medications   Prior to Admission medications   Medication Sig Start Date End Date Taking? Authorizing Provider  acetaminophen (TYLENOL) 325 MG tablet Take 650 mg by mouth every 6 (six) hours as needed for mild pain.    Yes Historical Provider, MD  aspirin 81 MG tablet Take 81 mg by mouth daily.    Yes Historical Provider, MD  aspirin-acetaminophen-caffeine (EXCEDRIN MIGRAINE) [phone] MG per tablet Take 1 tablet by mouth every 6 (six) hours as needed for headache.   Yes Historical Provider, MD  beta carotene w/minerals (OCUVITE) tablet Take 1 tablet by mouth daily.   Yes Historical Provider, MD  metoprolol tartrate (LOPRESSOR) 25 MG tablet Take 1 tablet (25 mg total) by mouth 2 (two) times daily. Patient taking differently: Take 12.5 mg by mouth at bedtime. Take 1/2 tablet twice daily 03/13/14  Yes Burnell Blanks, MD  Multiple Vitamin (MULTIVITAMIN) tablet Take 1 tablet by mouth daily.    Yes Historical Provider, MD  Polyethyl Glycol-Propyl Glycol (SYSTANE) 0.4-0.3 % GEL Apply 1 drop to eye at bedtime.    Yes Historical Provider, MD  traMADol (ULTRAM) 50 MG tablet Take 50 mg by mouth every 12 (twelve) hours as needed for moderate pain.   Yes  Historical Provider, MD  nitroGLYCERIN (NITROSTAT) 0.4 MG SL tablet Place 1 tablet (0.4 mg total) under the tongue every 5 (five) minutes as needed for chest pain (MAX 3 TABLETS). 10/31/13   Burnell Blanks, MD   BP 154/54 mmHg  Pulse 78  Temp(Src) 98.2 F (36.8 C)  Resp 19  Ht 5\' 4"  (1.626 m)  Wt 115 lb (52.164 kg)  BMI 19.73 kg/m2  SpO2 100% Physical Exam  Constitutional: She is oriented to person, place, and time. She appears well-developed and well-nourished. No distress.  HENT:  Head: Normocephalic and atraumatic.  Eyes: Conjunctivae and EOM are normal. Pupils are equal, round, and reactive to light.  Neck: Normal range of motion. Neck supple.  Cardiovascular: Normal rate, regular rhythm, S1 normal, S2 normal and normal heart sounds.   Pulses:      Radial pulses are 2+ on the right side, and 2+ on the left side.  Dorsalis pedis pulses are 2+ on the right side, and 2+ on the left side.  Pulmonary/Chest: Effort normal and breath sounds normal. No accessory muscle usage. No respiratory distress. She has no decreased breath sounds.  Abdominal: Soft. Bowel sounds are normal. There is no tenderness. There is no rebound and no CVA tenderness.  Ostomy in place with no gross blood or melena.   Musculoskeletal: Normal range of motion.  Neurological: She is alert and oriented to person, place, and time. She has normal reflexes. No cranial nerve deficit or sensory deficit. GCS eye subscore is 4. GCS verbal subscore is 5. GCS motor subscore is 6.  Skin: Skin is warm and dry. She is not diaphoretic.  Psychiatric: She has a normal mood and affect.    ED Course  Procedures (including critical care time) Labs Review Labs Reviewed  BASIC METABOLIC PANEL - Abnormal; Notable for the following:    Chloride 100 (*)    GFR calc non Af Amer 50 (*)    GFR calc Af Amer 57 (*)    All other components within normal limits  D-DIMER, QUANTITATIVE (NOT AT [facility]) - Abnormal; Notable for the  following:    D-Dimer, Quant 1.27 (*)    All other components within normal limits  BRAIN NATRIURETIC PEPTIDE - Abnormal; Notable for the following:    B Natriuretic Peptide 140.5 (*)    All other components within normal limits  URINE CULTURE  CBC  TROPONIN I  URINALYSIS, ROUTINE W REFLEX MICROSCOPIC (NOT AT [facility])  Randolm Idol, ED    Imaging Review Dg Chest 2 View  09/09/2014   CLINICAL DATA:  Dyspnea for 2-3 days, worsened today.  EXAM: CHEST  2 VIEW  COMPARISON:  03/12/2014  FINDINGS: There is moderate hyperinflation. There is mild unchanged cardiomegaly. There is a calcified mitral annulus. The lungs are clear. There are no effusions. Pulmonary vasculature is normal.  IMPRESSION: Unchanged mild cardiomegaly.  Hyperinflation.  No acute findings.   Electronically Signed   By: Andreas Newport M.D.   On: 09/09/2014 18:38   Ct Angio Chest Pe W/cm &/or Wo Cm  09/10/2014   CLINICAL DATA:  81 year old female with shortness of breath for 2-3 days and elevated D-dimer. History of rectal cancer.  EXAM: CT ANGIOGRAPHY CHEST WITH CONTRAST  TECHNIQUE: Multidetector CT imaging of the chest was performed using the standard protocol during bolus administration of intravenous contrast. Multiplanar CT image reconstructions and MIPs were obtained to evaluate the vascular anatomy.  CONTRAST:  149mL OMNIPAQUE IOHEXOL 350 MG/ML SOLN  COMPARISON:  Radiographs earlier this day.  Chest CT 10/12/2010  FINDINGS: There are no filling defects within the pulmonary arteries to suggest pulmonary embolus.  Thoracic aorta is normal in caliber without mild tortuosity and atheromatous plaque. No dissection or aneurysm. Conventional branching pattern from the aortic arch.  The heart size is upper normal. There are coronary artery calcifications. There is no pleural or pericardial effusion. No mediastinal or hilar adenopathy.  Emphysema. Small clustered nodular opacities in the anterior segment of the right upper lobe and to  a lesser extent periphery of the right lower lobe. These are new from prior exam. The previously described nodule on the right lung is partially obscured. There is biapical pleural parenchymal scarring as well as scarring in the lingula. Scattered calcified nodules consistent prior granulomatous disease. Linear atelectasis in both dependent lungs.  Evaluation of the upper abdomen demonstrates a bilobed cyst in the right lobe of the liver, unchanged from prior  exam. No acute abnormality.  There are no acute or suspicious osseous abnormalities. Age appropriate degenerative change in the spine.  Review of the MIP images confirms the above findings.  IMPRESSION: 1. No pulmonary embolus. 2. Clustered nodular opacities in the anterior right upper lobe and periphery of the right lower lobe, suggestive of bronchiolitis. This may be infectious or inflammatory. Given history of malignancy, a follow-up CT in 3 months after course of treatment could be considered to evaluate for imaging stability. 3. Emphysema with multifocal scarring. 4. Coronary artery disease.   Electronically Signed   By: Jeb Levering M.D.   On: 09/10/2014 00:37     EKG Interpretation   Date/Time:  Wednesday September 09 2014 17:52:27 EDT Ventricular Rate:  86 PR Interval:  207 QRS Duration: 145 QT Interval:  422 QTC Calculation: 505 R Axis:   23 Text Interpretation:  Sinus rhythm Left bundle branch block LBBB is new  since last tracing Confirmed by KNAPP  MD-J, JON ([id number]) on 09/09/2014  7:40:36 PM      MDM   Final diagnoses:  Shortness of breath   Evaluation patient was hemodynamically stable and in no acute distress.  Pt denies any continued SOB and satting 100% in the ED with no tachypnea.  Denied any CP, n/v or diaphoresis in the ED.   Ddx ACS, PE, PNA, PTX, anemia, neuromuscular d/o.   EKG displayed LBBB not consistent with last EKG.  Troponin negative however and pt with no CP, SOB, or symptoms concerning for ACS in the ED.   Discussed with Dr. Candyce Churn, cardiology fellow, who reports only 10% of new LBBB presentations have ACS.  Pt has had symptoms of SOB for prolonged time with increase today.  If ACS present over the last several days likely troponin would be positive and more characteristic symptoms of ACS would be present.    CXR with cardiomegaly and mildly elevated BNP.  Doubt CHF exacerbation. No PTX or PNA on CXR.  Pt with no anemia and denies any melena.  No increasing weakness and no signs of neuromuscular d/o at this time.  D-dimer positive and CTA PE performed.  No PE but nodular opacities in upper lobe.  Advised will need to be followed up with repeat CT through PCP.  Pt stable for discharge at this time but gave strict red flag return precautions including SOB, CP, n/v, near syncope, or palpitations. Unknown etiology of SOB and fatigue but resolved in ED and pt and friend at bedside feel comfortable taking pt home.    If performed, labs, EKGs, and imaging were reviewed/interpreted by myself and my attending and incorporated into medical decision making.  Discussed pertinent finding with patient or caregiver prior to discharge with no further questions.  Immediate return precautions given and pt or caregiver reports understanding.  Pt care supervised by my attending Dr. Junius Roads, MD PGY-2  Emergency Medicine         Geronimo Boot, MD 09/10/14 [place], MD 09/10/14 [phone]

## 2014-09-09 NOTE — ED Notes (Signed)
Lab called to add-on troponin-I, dark green tube was sent to main lab by mistake.

## 2014-09-10 NOTE — Discharge Instructions (Signed)

## 2014-09-11 LAB — URINE CULTURE: Culture: NO GROWTH

## 2014-10-01 ENCOUNTER — Encounter (HOSPITAL_COMMUNITY): Payer: Self-pay | Admitting: *Deleted

## 2014-10-01 ENCOUNTER — Emergency Department (HOSPITAL_COMMUNITY)
Admission: EM | Admit: 2014-10-01 | Discharge: 2014-10-01 | Disposition: A | Discharge status: Home / Self Care | Payer: Medicare Other | Attending: Emergency Medicine | Admitting: Emergency Medicine

## 2014-10-01 ENCOUNTER — Emergency Department (HOSPITAL_COMMUNITY): Payer: Medicare Other

## 2014-10-01 DIAGNOSIS — Z933 Colostomy status: Secondary | ICD-10-CM | POA: Diagnosis not present

## 2014-10-01 DIAGNOSIS — I499 Cardiac arrhythmia, unspecified: Secondary | ICD-10-CM | POA: Diagnosis not present

## 2014-10-01 DIAGNOSIS — I2510 Atherosclerotic heart disease of native coronary artery without angina pectoris: Secondary | ICD-10-CM | POA: Insufficient documentation

## 2014-10-01 DIAGNOSIS — Z7982 Long term (current) use of aspirin: Secondary | ICD-10-CM | POA: Diagnosis not present

## 2014-10-01 DIAGNOSIS — Z85048 Personal history of other malignant neoplasm of rectum, rectosigmoid junction, and anus: Secondary | ICD-10-CM | POA: Diagnosis not present

## 2014-10-01 DIAGNOSIS — I10 Essential (primary) hypertension: Secondary | ICD-10-CM | POA: Diagnosis not present

## 2014-10-01 DIAGNOSIS — N390 Urinary tract infection, site not specified: Secondary | ICD-10-CM | POA: Diagnosis not present

## 2014-10-01 DIAGNOSIS — M1990 Unspecified osteoarthritis, unspecified site: Secondary | ICD-10-CM | POA: Insufficient documentation

## 2014-10-01 DIAGNOSIS — I252 Old myocardial infarction: Secondary | ICD-10-CM | POA: Insufficient documentation

## 2014-10-01 DIAGNOSIS — Z79899 Other long term (current) drug therapy: Secondary | ICD-10-CM | POA: Insufficient documentation

## 2014-10-01 DIAGNOSIS — R3989 Other symptoms and signs involving the genitourinary system: Secondary | ICD-10-CM | POA: Diagnosis present

## 2014-10-01 DIAGNOSIS — Z9861 Coronary angioplasty status: Secondary | ICD-10-CM | POA: Diagnosis not present

## 2014-10-01 DIAGNOSIS — R197 Diarrhea, unspecified: Secondary | ICD-10-CM | POA: Insufficient documentation

## 2014-10-01 DIAGNOSIS — I480 Paroxysmal atrial fibrillation: Secondary | ICD-10-CM | POA: Insufficient documentation

## 2014-10-01 DIAGNOSIS — R531 Weakness: Secondary | ICD-10-CM | POA: Diagnosis not present

## 2014-10-01 DIAGNOSIS — H9190 Unspecified hearing loss, unspecified ear: Secondary | ICD-10-CM | POA: Insufficient documentation

## 2014-10-01 DIAGNOSIS — Z880 Allergy status to penicillin: Secondary | ICD-10-CM | POA: Diagnosis not present

## 2014-10-01 DIAGNOSIS — Z8639 Personal history of other endocrine, nutritional and metabolic disease: Secondary | ICD-10-CM | POA: Insufficient documentation

## 2014-10-01 LAB — URINALYSIS, ROUTINE W REFLEX MICROSCOPIC
BILIRUBIN URINE: NEGATIVE
Glucose, UA: NEGATIVE mg/dL
Hgb urine dipstick: NEGATIVE
Ketones, ur: 40 mg/dL — AB
NITRITE: NEGATIVE
PH: 5.5 (ref 5.0–8.0)
Protein, ur: NEGATIVE mg/dL
Specific Gravity, Urine: 1.016 (ref 1.005–1.030)
Urobilinogen, UA: 0.2 mg/dL (ref 0.0–1.0)

## 2014-10-01 LAB — COMPREHENSIVE METABOLIC PANEL
ALBUMIN: 3.6 g/dL (ref 3.5–5.0)
ALT: 17 U/L (ref 14–54)
AST: 26 U/L (ref 15–41)
Alkaline Phosphatase: 58 U/L (ref 38–126)
Anion gap: 8 (ref 5–15)
BILIRUBIN TOTAL: 0.7 mg/dL (ref 0.3–1.2)
BUN: 10 mg/dL (ref 6–20)
CHLORIDE: 103 mmol/L (ref 101–111)
CO2: 24 mmol/L (ref 22–32)
CREATININE: 0.72 mg/dL (ref 0.44–1.00)
Calcium: 8.8 mg/dL — ABNORMAL LOW (ref 8.9–10.3)
GFR calc Af Amer: >60 mL/min (ref >60)
GLUCOSE: 96 mg/dL (ref 65–99)
Potassium: 3.8 mmol/L (ref 3.5–5.1)
Sodium: 135 mmol/L (ref 135–145)
Total Protein: 6.7 g/dL (ref 6.5–8.1)

## 2014-10-01 LAB — CBC WITH DIFFERENTIAL/PLATELET
BASOS ABS: 0 10*3/uL (ref 0.0–0.1)
Basophils Relative: 0 % (ref 0–1)
EOS PCT: 0 % (ref 0–5)
Eosinophils Absolute: 0 10*3/uL (ref 0.0–0.7)
HCT: 38.5 % (ref 36.0–46.0)
Hemoglobin: 12.7 g/dL (ref 12.0–15.0)
Lymphocytes Relative: 6 % — ABNORMAL LOW (ref 12–46)
Lymphs Abs: 0.5 10*3/uL — ABNORMAL LOW (ref 0.7–4.0)
MCH: 31.2 pg (ref 26.0–34.0)
MCHC: 33 g/dL (ref 30.0–36.0)
MCV: 94.6 fL (ref 78.0–100.0)
Monocytes Absolute: 0.3 10*3/uL (ref 0.1–1.0)
Monocytes Relative: 4 % (ref 3–12)
Neutro Abs: 6.9 10*3/uL (ref 1.7–7.7)
Neutrophils Relative %: 90 % — ABNORMAL HIGH (ref 43–77)
PLATELETS: 189 10*3/uL (ref 150–400)
RBC: 4.07 MIL/uL (ref 3.87–5.11)
RDW: 13 % (ref 11.5–15.5)
WBC: 7.7 10*3/uL (ref 4.0–10.5)

## 2014-10-01 LAB — BRAIN NATRIURETIC PEPTIDE: B NATRIURETIC PEPTIDE 5: 203.2 pg/mL — AB (ref 0.0–100.0)

## 2014-10-01 LAB — URINE MICROSCOPIC-ADD ON

## 2014-10-01 LAB — LIPASE, BLOOD: LIPASE: 18 U/L — AB (ref 22–51)

## 2014-10-01 MED ORDER — ONDANSETRON HCL 4 MG/2ML IJ SOLN
4.0000 mg | Freq: Once | INTRAMUSCULAR | Status: AC
  Administered 2014-10-01: 4 mg via INTRAVENOUS
  Filled 2014-10-01: qty 2

## 2014-10-01 MED ORDER — CEPHALEXIN 500 MG PO CAPS: 500.0000 mg | ORAL_CAPSULE | Freq: Two times a day (BID) | ORAL | Status: DC

## 2014-10-01 MED ORDER — TRAMADOL HCL 50 MG PO TABS
50.0000 mg | ORAL_TABLET | Freq: Once | ORAL | Status: AC
  Administered 2014-10-01: 50 mg via ORAL
  Filled 2014-10-01: qty 1

## 2014-10-01 MED ORDER — CEFTRIAXONE SODIUM 1 G IJ SOLR
1.0000 g | Freq: Once | INTRAMUSCULAR | Status: AC
  Administered 2014-10-01: 1 g via INTRAVENOUS
  Filled 2014-10-01: qty 10

## 2014-10-01 MED ORDER — SODIUM CHLORIDE 0.9 % IV BOLUS (SEPSIS)
1000.0000 mL | Freq: Once | INTRAVENOUS | Status: AC
  Administered 2014-10-01: 1000 mL via INTRAVENOUS

## 2014-10-01 NOTE — ED Notes (Addendum)
From home, AAOx 3. 6 day ago began having UTI symptoms, went to primary MD, culture came back yesterday. Diagnosed with Ecoli -UTI, took first dose of abx last night. Stopped taking her abx d/t "side effects of them." - was having chills, nausea.  Diarrhea since July 4th, has seen pcp for this. Had appointment with GI this afternoon. Missing it d/t being here.   Orthostatic, BP dropped from 148/50 sitting to 122/50 standing. spo2 was 91% on room air.  rec'd 300 cc NS en route, 4 mg zofran

## 2014-10-01 NOTE — Progress Notes (Signed)
EDCM spoke to patient and her neighbor Redmond Baseman at bedside.  Patient confirms she lives at home alone.  Patient reports her niece Vangie Bicker is her POA [phone] or [phone].  Patient reports she does not wear oxygen at home.  Patient confirms she has a walker, cane at home but she doesn't use them.  Patient reports whenever she needs her niece, she is available.  Patient reports she is able to complete her ADL's on her own without difficulty.  Patient does not have home health services currently but has had them in the past after she was discharged from [facility] in 2012.  Patient confirms her pcp is Dr. Claris Gower on [address].  EDCM explained home health services and provided patient with list of home health agencies in [facility].  Patient is unsure if she needs home health at this time.  [facility] informed patient that she may have home health arranged in the future by her pcp if she chooses.  Patient and patient's friend thankful for services.  No further EDCM needs at this time.

## 2014-10-01 NOTE — Discharge Instructions (Signed)

## 2014-10-01 NOTE — ED Notes (Signed)
RN to draw labs off IV line.

## 2014-10-01 NOTE — ED Provider Notes (Signed)
CSN: [id number]     Arrival date & time 10/01/14  1433 History   First MD Initiated Contact with Patient 10/01/14 1506     Chief Complaint  Patient presents with  . Urinary Tract Infection  . Diarrhea     (Consider location/radiation/quality/duration/timing/severity/associated sxs/prior Treatment) HPI Comments: 81 year old female with past medical history including CAD status post STEMI and stenting, paroxysmal A. fib, hypertension, hyperlipidemia, rectal cancer status post resection with colostomy who presents with dysuria and shortness of breath. The patient states that 6 days ago, she began having burning with urination. She denies any hematuria. She has felt generally weak with decreased appetite and has felt feverish with chills. She was evaluated yesterday at an urgent care and a urine culture grew out Escherichia coli. She was given a prescription for nitrofurantoin which began yesterday, however she has continued to feel bad despite taking the medication. She also notes that she has had intermittent episodes of nonbloody diarrhea since 7/4. She had GI follow-up arranged for today but was unable to attend due to coming to the ER. The diarrhea is not daily. It is worse with some foods including dairy products. Patient also endorses intermittent episodes of shortness of breath which have persisted for the past several weeks and her shortness of breath with worse last night. No shortness of breath laying flat. No associated chest pain or cough. No lower ext edema. Due to her ongoing symptoms, she called EMS who noted that she was orthostatic. O2 sat was 91% on room air. She received 300 mL of normal saline and 4 mg Zofran during transport.  Patient is a 81 y.o. female presenting with urinary tract infection and diarrhea. The history is provided by the patient.  Urinary Tract Infection Diarrhea   Past Medical History  Diagnosis Date  . Hyperlipidemia   . Hypertension   . Rectovaginal fistula  post abscess with TEM - diverted 01/11/2011  . History of ST elevation myocardial infarction (STEMI) 08-26-2010-- INFERIOR WALL    S/P PCI  BMS IN RCA AND PROX. CX  . S/P coronary artery stent placement 08/2010    X2  BM  . CAD (coronary artery disease) CARDIOLOGIST-- DR Angelena Form    a. s/p INF STEMI 7/12: tx with BMS to RCA;  b. cath 08/26/10: pLAD 30%, mLAD 50%, D1 40%, pCFX 95%, mRCA occluded;   c. staged PCI of pCFX with BMS;   d. echo 7/12:   EF 60-65%, mild RAE, mild to moderate AI, mild MR, moderate TR, RVE, PASP 47  . PAF (paroxysmal atrial fibrillation)   . Pulmonary nodules BENIGN  PER CT  10-12-2010  . Dyspnea   . Arthritis     FINGERS   . Osteopenia   . Impaired hearing BILATERAL HEARING AIDS  . Heart palpitations PAC'S AND SVT RUN'S  PER CARDIOLOGIST NOTE  . Rectal Cancer 08/2010    adenocarcinoma   S/P PARTIAL PROCTECTOMY (NO CHEMO OR RADIATION)  . S/P colostomy SECONDARY TO RECTOVAGINAL FISTULA  . First degree heart block   . Complication of anesthesia PT STATES "MADE HER FEEL CRAZY"  . PONV (postoperative nausea and vomiting)   . Degeneration of eye     left eye cornea   Past Surgical History  Procedure Laterality Date  . Knee surgery Right 1996  . Flexible sigmoidoscopy N/A 04/02/2012    Procedure: FLEXIBLE SIGMOIDOSCOPY;  Surgeon: Leighton Ruff, MD;  Location: [facility];  Service: Endoscopy;  Laterality: N/A;  . Partial proctectomy by tem  11-17-2010    RECTAL CANCER  . Laproscopy lysis adhesions/ drainage of pelvic abscess/ diverting loop sigmoid colectomy  11-22-2010    POST OP RECTOVAGINAL FISTULA  . Tympanoplasty Left 12-23-2009  . Stapedectomy  1970'S  . Total hip arthroplasty Right 1992  . Excision benign cyst right breast    . Abdominal hysterectomy  1950's    AND APPENDECTOMY  . Tonsillectomy  CHILD  . Coronary angioplasty with stent placement  08-26-2010  DR [facility]    PCI, BM STENT IN RCA  . Coronary angioplasty with stent placement  08-29-2010   DR [facility]    PCI, BM STENT IN PROXIMAL CIRCUMFLEX  . Release left carpal tunnel/ osteotomy left distal radius  11-10-2009  . Cataract extraction w/ intraocular lens  implant, bilateral    . Transthoracic echocardiogram  10-10-2011    NORMAL LV SIZE WITH MILD FOCAL BASAL SEPTAL HYPERTROPHY/ EF 55-60%/ NORMAL RV SIZE AND LVSF/ BIATRIAL ENLARGEMENT/ MILD TO MODERATE AI  &  TR  . Fistula plug N/A 06/21/2012    Procedure: insertion of FISTULA PLUG;  Surgeon: Leighton Ruff, MD;  Location: [place];  Service: General;  Laterality: N/A;   Family History  Problem Relation Age of Onset  . Kidney disease Mother   . Heart disease Father   . Cancer Brother    Social History  Substance Use Topics  . Smoking status: Never Smoker   . Smokeless tobacco: Never Used  . Alcohol Use: No   OB History    No data available     Review of Systems  Gastrointestinal: Positive for diarrhea.   10 Systems reviewed and are negative for acute change except as noted in the HPI.    Allergies  Effexor; Penicillins; Sulfa antibiotics; Dicyclomine; and Hydrocodone  Home Medications   Prior to Admission medications   Medication Sig Start Date End Date Taking? Authorizing Provider  acetaminophen (TYLENOL) 325 MG tablet Take 650 mg by mouth every 6 (six) hours as needed for mild pain.    Yes Historical Provider, MD  aspirin 81 MG tablet Take 81 mg by mouth daily.    Yes Historical Provider, MD  aspirin-acetaminophen-caffeine (EXCEDRIN MIGRAINE) [phone] MG per tablet Take 1 tablet by mouth every 6 (six) hours as needed for headache.   Yes Historical Provider, MD  beta carotene w/minerals (OCUVITE) tablet Take 1 tablet by mouth daily.   Yes Historical Provider, MD  digoxin (LANOXIN) 0.125 MG tablet Take 125 mcg by mouth daily at 12 noon. 09/11/14  Yes Historical Provider, MD  loratadine (CLARITIN) 10 MG tablet Take 10 mg by mouth daily as needed for allergies or itching.   Yes Historical  Provider, MD  Multiple Vitamin (MULTIVITAMIN) tablet Take 1 tablet by mouth daily.    Yes Historical Provider, MD  nitrofurantoin (MACRODANTIN) 100 MG capsule Take 100 mg by mouth 2 (two) times daily. Seven day course started on 09/30/14 09/30/14  Yes Historical Provider, MD  nitroGLYCERIN (NITROSTAT) 0.4 MG SL tablet Place 1 tablet (0.4 mg total) under the tongue every 5 (five) minutes as needed for chest pain (MAX 3 TABLETS). 10/31/13  Yes Burnell Blanks, MD  Polyethyl Glycol-Propyl Glycol (SYSTANE) 0.4-0.3 % GEL Apply 1 drop to eye at bedtime.    Yes Historical Provider, MD  cephALEXin (KEFLEX) 500 MG capsule Take 1 capsule (500 mg total) by mouth 2 (two) times daily. 10/01/14   Sharlett Iles, MD  metoprolol tartrate (LOPRESSOR) 25 MG tablet Take 1 tablet (25  mg total) by mouth 2 (two) times daily. Patient not taking: Reported on 10/01/2014 03/13/14   Burnell Blanks, MD   BP 124/46 mmHg  Pulse 79  Temp(Src) 99.5 F (37.5 C) (Oral)  Resp 23  SpO2 91% Physical Exam  Constitutional: She is oriented to person, place, and time. No distress.  Frail, elderly female in no acute distress  HENT:  Head: Normocephalic and atraumatic.  Nose: Nose normal.  Mouth/Throat: No oropharyngeal exudate.  Dry mucous membranes  Eyes: Conjunctivae are normal. Pupils are equal, round, and reactive to light.  Neck: Neck supple.  Cardiovascular: Normal rate and normal heart sounds.   No murmur heard. Irregularly irregular  Pulmonary/Chest: Effort normal and breath sounds normal. No respiratory distress. She has no wheezes.  Abdominal: Soft. Bowel sounds are normal. She exhibits no distension. There is no tenderness.  Colostomy in place with normal-appearing stool  Musculoskeletal: She exhibits no edema.  Neurological: She is alert and oriented to person, place, and time.  Fluent speech  Skin: Skin is warm and dry.  Psychiatric: She has a normal mood and affect. Judgment normal.  Nursing  note and vitals reviewed.   ED Course  Procedures (including critical care time) Labs Review Labs Reviewed  COMPREHENSIVE METABOLIC PANEL - Abnormal; Notable for the following:    Calcium 8.8 (*)    All other components within normal limits  LIPASE, BLOOD - Abnormal; Notable for the following:    Lipase 18 (*)    All other components within normal limits  CBC WITH DIFFERENTIAL/PLATELET - Abnormal; Notable for the following:    Neutrophils Relative % 90 (*)    Lymphocytes Relative 6 (*)    Lymphs Abs 0.5 (*)    All other components within normal limits  URINALYSIS, ROUTINE W REFLEX MICROSCOPIC (NOT AT [facility]) - Abnormal; Notable for the following:    Ketones, ur 40 (*)    Leukocytes, UA TRACE (*)    All other components within normal limits  BRAIN NATRIURETIC PEPTIDE - Abnormal; Notable for the following:    B Natriuretic Peptide 203.2 (*)    All other components within normal limits  URINE CULTURE  URINE MICROSCOPIC-ADD ON    Imaging Review Dg Chest 2 View  10/01/2014   CLINICAL DATA:  Shortness of breath and weakness today. Chills and nausea.  EXAM: CHEST  2 VIEW  COMPARISON:  CT chest and PA and lateral chest 09/09/2014.  FINDINGS: Lung volumes are slightly lower than on the comparison examination with mild subsegmental atelectasis in the bases. No consolidative process, pneumothorax or effusion is identified. Heart size is mildly enlarged.  IMPRESSION: No acute disease.   Electronically Signed   By: Inge Rise M.D.   On: 10/01/2014 16:20   I have personally reviewed and evaluated these  lab results as part of my medical decision-making.   EKG Interpretation None     Medications  sodium chloride 0.9 % bolus 1,000 mL (0 mLs Intravenous Stopped 10/01/14 1737)  ondansetron (ZOFRAN) injection 4 mg (4 mg Intravenous Given 10/01/14 1559)  traMADol (ULTRAM) tablet 50 mg (50 mg Oral Given 10/01/14 1634)  cefTRIAXone (ROCEPHIN) 1 g in dextrose 5 % 50 mL IVPB (0 g Intravenous  Stopped 10/01/14 1816)    MDM   Final diagnoses:  Urinary tract infection without hematuria, site unspecified  Weakness generalized    81 year old female who presents with 1 week of dysuria and recent positive culture for Escherichia coli as well as complaints of several weeks  of shortness of breath and diarrhea. Patient well-appearing at presentation. She was 100% on 2 L nasal cannula; she remained in the mid 90s on room air during my examination. No abnormal lung sounds noted no evidence of volume overload. She does appear to be dry on exam. Obtained above lab work as well as chest x-ray and EKG. Gave the patient an IV fluid bolus.  Regarding diarrhea, the patient's symptoms did not begin immediately after an antibiotic course. She does not have daily diarrhea thus I feel C. difficile is unlikely. Given patient's report that the diarrhea is worse with certain foods, I feel that infectious etiology is unlikely she is safe to follow up with GI for this symptom.  Chest x-ray was unremarkable and BNP 200. Regarding the patient's shortness of breath. She states that his been intermittent for several weeks and she presented here in July for the same symptoms. At that time, she had a CTA of the chest that was negative for PE. Because of the reassuring CTA at that time and given that she reports symptoms are the same as that presentation, I do not feel she needs any further workup at this time and can follow up with PCP for further outpatient workup of shortness of breath.   UA shows some ketones, trace leukocytes, and a few white blood cells. Given the patient's ongoing symptoms, gave her a dose of ceftriaxone. Because of her advanced age, I feel that she needs a bacteriacidal rather than bacteriostatic antibiotic. Gave patient a dose of ceftriaxone in the emergency department and Provided with Keflex course. Patient tolerating water in ED. Extensively reviewed return precautions including fever, back  pain, abdominal pain, intractable vomiting, or any other new symptoms. Patient voiced understanding and will follow-up with PCP in a few days. Patient discharged in satisfactory condition.  Sharlett Iles, MD 10/01/14 [phone]

## 2014-10-01 NOTE — ED Notes (Signed)
Patient transported to X-ray 

## 2014-10-01 NOTE — ED Notes (Signed)
Bed: WA01 Expected date:  Expected time:  Means of arrival:  Comments: No  monitor

## 2014-10-03 LAB — URINE CULTURE
Culture: NO GROWTH
Special Requests: NORMAL

## 2014-11-02 ENCOUNTER — Emergency Department (HOSPITAL_COMMUNITY)
Admission: EM | Admit: 2014-11-02 | Discharge: 2014-11-03 | Disposition: A | Discharge status: Home / Self Care | Payer: Medicare Other | Attending: Emergency Medicine | Admitting: Emergency Medicine

## 2014-11-02 ENCOUNTER — Encounter (HOSPITAL_COMMUNITY): Payer: Self-pay | Admitting: Emergency Medicine

## 2014-11-02 DIAGNOSIS — Z7982 Long term (current) use of aspirin: Secondary | ICD-10-CM | POA: Insufficient documentation

## 2014-11-02 DIAGNOSIS — Z85040 Personal history of malignant carcinoid tumor of rectum: Secondary | ICD-10-CM | POA: Insufficient documentation

## 2014-11-02 DIAGNOSIS — I252 Old myocardial infarction: Secondary | ICD-10-CM | POA: Diagnosis not present

## 2014-11-02 DIAGNOSIS — Z880 Allergy status to penicillin: Secondary | ICD-10-CM | POA: Diagnosis not present

## 2014-11-02 DIAGNOSIS — M1990 Unspecified osteoarthritis, unspecified site: Secondary | ICD-10-CM | POA: Diagnosis not present

## 2014-11-02 DIAGNOSIS — R0602 Shortness of breath: Secondary | ICD-10-CM | POA: Diagnosis not present

## 2014-11-02 DIAGNOSIS — I2510 Atherosclerotic heart disease of native coronary artery without angina pectoris: Secondary | ICD-10-CM | POA: Insufficient documentation

## 2014-11-02 DIAGNOSIS — I10 Essential (primary) hypertension: Secondary | ICD-10-CM | POA: Insufficient documentation

## 2014-11-02 DIAGNOSIS — R197 Diarrhea, unspecified: Secondary | ICD-10-CM | POA: Diagnosis not present

## 2014-11-02 DIAGNOSIS — Z79899 Other long term (current) drug therapy: Secondary | ICD-10-CM | POA: Diagnosis not present

## 2014-11-02 DIAGNOSIS — R002 Palpitations: Secondary | ICD-10-CM

## 2014-11-02 MED ORDER — ASPIRIN 81 MG PO CHEW: 324.0000 mg | CHEWABLE_TABLET | Freq: Once | ORAL | Status: DC

## 2014-11-02 NOTE — ED Provider Notes (Signed)
CSN: [id number]   Arrival date & time 11/02/14 2333  History  This chart was scribed for Merryl Hacker, MD by Altamease Oiler, ED Scribe. This patient was seen in room B18C/B18C and the patient's care was started at 11:47 PM.  Chief Complaint  Patient presents with  . Palpitations    HPI The history is provided by the patient. No language interpreter was used.   Brought in by EMS from home, [name] is a 81 y.o. female with PMHx of CAD, STEMI s/p PCI, paroxysmal a-fib, HLD, HTN who presents to the Emergency Department complaining of palpitations and irregular heartbeat with onset around 3 PM this afternoon. Associated symptoms include 1 month of SOB. She had 1 loose bowel movement this morning but none since.  Pt denies chest pain, nausea, vomiting, and LE swelling. She recently stopped metoprolol on 10/06/14 and was started on lisinopril. It was making her itch and she developed a cough and this was discontinued on Saturday.  No history of DVT/PE.    Patient's primary cardiologist is Dr. Angelena Form.  Stenting in July 2012.  Past Medical History  Diagnosis Date  . Hyperlipidemia   . Hypertension   . 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 History of ST elevation myocardial infarction (STEMI) 08-26-2010-- INFERIOR WALL    S/P PCI  BMS IN RCA AND PROX. CX  . S/P coronary artery stent placement 08/2010    X2  BM  . CAD (coronary artery disease) CARDIOLOGIST-- DR Angelena Form    a. s/p INF STEMI 7/12: tx with BMS to RCA;  b. cath 08/26/10: pLAD 30%, mLAD 50%, D1 40%, pCFX 95%, mRCA occluded;   c. staged PCI of pCFX with BMS;   d. echo 7/12:   EF 60-65%, mild RAE, mild to moderate AI, mild MR, moderate TR, RVE, PASP 47  . PAF (paroxysmal atrial fibrillation)   . Pulmonary nodules BENIGN  PER CT  10-12-2010  . Dyspnea   . Arthritis     FINGERS   . Osteopenia   . Impaired hearing BILATERAL HEARING AIDS  . Heart palpitations PAC'S AND SVT RUN'S  PER  CARDIOLOGIST NOTE  . Rectal Cancer 08/2010    adenocarcinoma   S/P PARTIAL PROCTECTOMY (NO CHEMO OR RADIATION)  . S/P colostomy SECONDARY TO RECTOVAGINAL FISTULA  . First degree heart block   . Complication of anesthesia PT STATES "MADE HER FEEL CRAZY"  . PONV (postoperative nausea and vomiting)   . Degeneration of eye     left eye cornea    Past Surgical History  Procedure Laterality Date  . Knee surgery Right 1996  . Flexible sigmoidoscopy N/A 04/02/2012    Procedure: FLEXIBLE SIGMOIDOSCOPY;  Surgeon: Leighton Ruff, MD;  Location: [facility];  Service: Endoscopy;  Laterality: N/A;  . Partial proctectomy by tem  11-17-2010    RECTAL CANCER  . Laproscopy lysis adhesions/ drainage of pelvic abscess/ diverting loop sigmoid colectomy  11-22-2010    POST OP RECTOVAGINAL FISTULA  . Tympanoplasty Left 12-23-2009  . Stapedectomy  1970'S  . Total hip arthroplasty Right 1992  . Excision benign cyst right breast    . Abdominal hysterectomy  1950's    AND APPENDECTOMY  . Tonsillectomy  CHILD  . Coronary angioplasty with stent placement  08-26-2010  DR [facility]    PCI, BM STENT IN RCA  . Coronary angioplasty with stent placement  08-29-2010  DR [facility]    PCI, BM STENT IN PROXIMAL CIRCUMFLEX  .  Release left carpal tunnel/ osteotomy left distal radius  11-10-2009  . Cataract extraction w/ intraocular lens  implant, bilateral    . Transthoracic echocardiogram  10-10-2011    NORMAL LV SIZE WITH MILD FOCAL BASAL SEPTAL HYPERTROPHY/ EF 55-60%/ NORMAL RV SIZE AND LVSF/ BIATRIAL ENLARGEMENT/ MILD TO MODERATE AI  &  TR  . Fistula plug N/A 06/21/2012    Procedure: insertion of FISTULA PLUG;  Surgeon: Leighton Ruff, MD;  Location: [place];  Service: General;  Laterality: N/A;    Family History  Problem Relation Age of Onset  . Kidney disease Mother   . Heart disease Father   . Cancer Brother     Social History  Substance Use Topics  . Smoking status: Never Smoker   .  Smokeless tobacco: Never Used  . Alcohol Use: No     Review of Systems  Constitutional: Negative for fever.  Respiratory: Positive for shortness of breath. Negative for cough and chest tightness.   Cardiovascular: Positive for palpitations. Negative for chest pain and leg swelling.  Gastrointestinal: Positive for diarrhea. Negative for nausea, vomiting and abdominal pain.  Genitourinary: Negative for dysuria.  Neurological: Negative for headaches.  Psychiatric/Behavioral: Negative for confusion.  All other systems reviewed and are negative.  Home Medications   Prior to Admission medications   Medication Sig Start Date End Date Taking? Authorizing Provider  acetaminophen (TYLENOL) 325 MG tablet Take 650 mg by mouth every 6 (six) hours as needed for mild pain.     Historical Provider, MD  aspirin 81 MG tablet Take 81 mg by mouth daily.     Historical Provider, MD  aspirin-acetaminophen-caffeine (EXCEDRIN MIGRAINE) [phone] MG per tablet Take 1 tablet by mouth every 6 (six) hours as needed for headache.    Historical Provider, MD  beta carotene w/minerals (OCUVITE) tablet Take 1 tablet by mouth daily.    Historical Provider, MD  cephALEXin (KEFLEX) 500 MG capsule Take 1 capsule (500 mg total) by mouth 2 (two) times daily. 10/01/14   Sharlett Iles, MD  digoxin (LANOXIN) 0.125 MG tablet Take 125 mcg by mouth daily at 12 noon. 09/11/14   Historical Provider, MD  loratadine (CLARITIN) 10 MG tablet Take 10 mg by mouth daily as needed for allergies or itching.    Historical Provider, MD  metoprolol tartrate (LOPRESSOR) 25 MG tablet Take 0.5 tablets (12.5 mg total) by mouth 2 (two) times daily. 11/03/14   Merryl Hacker, MD  Multiple Vitamin (MULTIVITAMIN) tablet Take 1 tablet by mouth daily.     Historical Provider, MD  nitrofurantoin (MACRODANTIN) 100 MG capsule Take 100 mg by mouth 2 (two) times daily. Seven day course started on 09/30/14 09/30/14   Historical Provider, MD  nitroGLYCERIN  (NITROSTAT) 0.4 MG SL tablet Place 1 tablet (0.4 mg total) under the tongue every 5 (five) minutes as needed for chest pain (MAX 3 TABLETS). 10/31/13   Burnell Blanks, MD  Polyethyl Glycol-Propyl Glycol (SYSTANE) 0.4-0.3 % GEL Apply 1 drop to eye at bedtime.     Historical Provider, MD    Allergies  Effexor; Penicillins; Sulfa antibiotics; Dicyclomine; and Hydrocodone  Triage Vitals: BP 166/61 mmHg  Pulse 81  Temp(Src) 97.6 F (36.4 C) (Oral)  Resp 14  SpO2 97%  Physical Exam  Constitutional: She is oriented to person, place, and time.  Elderly, appears younger than stated age  HENT:  Head: Normocephalic and atraumatic.  Cardiovascular: Normal rate and normal heart sounds.   No murmur heard.  Irregular rhythm  Pulmonary/Chest: Effort normal and breath sounds normal. No respiratory distress. She has no wheezes. She exhibits no tenderness.  Abdominal: Soft. Bowel sounds are normal. There is no tenderness. There is no rebound.  Musculoskeletal: She exhibits no edema.  Neurological: She is alert and oriented to person, place, and time.  Skin: Skin is warm and dry.  Psychiatric: She has a normal mood and affect.  Nursing note and vitals reviewed.   ED Course  Procedures   DIAGNOSTIC STUDIES: Oxygen Saturation is 97% on RA, normal by my interpretation.    COORDINATION OF CARE: 11:50 PM Discussed treatment plan which includes CXR, lab work, and EKG with pt at bedside and pt agreed to plan.  3:20 AM-Consult complete with Dr. Aundra Dubin (Cardiology). Patient case explained and discussed. Call ended at 3:21 AM.  Labs Reviewed  BASIC METABOLIC PANEL - Abnormal; Notable for the following:    GFR calc non Af Amer 47 (*)    GFR calc Af Amer 54 (*)    All other components within normal limits  CBC - Abnormal; Notable for the following:    RBC 3.78 (*)    Hemoglobin 11.9 (*)    HCT 35.7 (*)    All other components within normal limits  D-DIMER, QUANTITATIVE (NOT AT [facility]) -  Abnormal; Notable for the following:    D-Dimer, Quant 1.44 (*)    All other components within normal limits  DIGOXIN LEVEL - Abnormal; Notable for the following:    Digoxin Level <0.2 (*)    All other components within normal limits  TROPONIN I  BRAIN NATRIURETIC PEPTIDE    Imaging Review Dg Chest 2 View  11/03/2014   CLINICAL DATA:  Palpitations, arrhythmia, hypertension, coronary artery disease, rectal cancer  EXAM: CHEST  2 VIEW  COMPARISON:  10/01/2014  FINDINGS: Normal heart size, mediastinal contours and pulmonary vascularity.  Mitral annular calcification.  Atherosclerotic calcification aorta.  Emphysematous and bronchitic changes consistent with COPD.  RIGHT apical scarring.  No acute infiltrate, pleural effusion or pneumothorax.  Diffuse osseous demineralization.  Chronic superior endplate height loss of a lower thoracic vertebra approximately T12, stable.  12 mm probable calcified granuloma projects over LEFT upper lobe.  Additional EKG lead projects over LEFT upper lobe.  IMPRESSION: COPD changes without definite acute abnormalities.   Electronically Signed   By: Lavonia Dana M.D.   On: 11/03/2014 01:31   Ct Angio Chest Pe W/cm &/or Wo Cm  11/03/2014   CLINICAL DATA:  Heart palpitations for past 2-3 weeks, some shortness of breath, elevated D-dimer, hypertension, hyperlipidemia, coronary artery disease post PTCA and stenting, rectal cancer, paroxysmal atrial fibrillation  EXAM: CT ANGIOGRAPHY CHEST WITH CONTRAST  TECHNIQUE: Multidetector CT imaging of the chest was performed using the standard protocol during bolus administration of intravenous contrast. Multiplanar CT image reconstructions and MIPs were obtained to evaluate the vascular anatomy.  CONTRAST:  110mL OMNIPAQUE IOHEXOL 350 MG/ML SOLN IV  COMPARISON:  09/09/2014  FINDINGS: Scattered atherosclerotic calcifications aorta, coronary arteries and proximal great vessels.  Mitral annular calcification noted.  No aortic aneurysm or  dissection.  Pulmonary arteries well opacified and patent.  No evidence of pulmonary embolism.  No thoracic adenopathy.  Low-attenuation lesion superiorly RIGHT lobe liver 3.0 x 2.0 cm likely cysts.  Calcified granulomata within spleen.  Remaining visualized upper abdomen unremarkable.  Emphysematous changes with biapical scarring.  Clustered nodular foci again identified in the lateral aspect of the RIGHT lower lobe and in the anterior segment  of the RIGHT upper lobe.  Segment of bronchiectasis within lingula.  Bibasilar atelectasis and few scattered calcified granulomata.  No acute infiltrate, pleural effusion or pneumothorax.  RIGHT lower lobe nodule image 35 stable.  Diffuse osseous demineralization.  Review of the MIP images confirms the above findings.  IMPRESSION: No evidence of pulmonary embolism.  Scattered atherosclerotic calcifications including coronary arteries.  COPD changes with clustered nodular foci again identified in the RIGHT upper and RIGHT lower lobes not significantly changed, question related to bronchiolitis or infection ; 2 history of rectal cancer, recommend continued surveillance.   Electronically Signed   By: Lavonia Dana M.D.   On: 11/03/2014 02:52    EKG Interpretation  Date/Time:  Monday November 02 2014 23:44:04 EDT Ventricular Rate:  79 PR Interval:  203 QRS Duration: 115 QT Interval:  404 QTC Calculation: 463 R Axis:   3 Text Interpretation:  Sinus rhythm Atrial premature complexes Similar morphology to prior Confirmed by HORTON  MD, COURTNEY ([id number]) on 11/02/2014 11:46:32 PM    MDM   Final diagnoses:  Palpitations  SOB (shortness of breath)    Patient presents with ongoing shortness of breath and palpitations. Recently taken off both metoprolol and lisinopril. History of paroxysmal A. fib. She appears to be in sinus rhythm with PACs. Otherwise nontoxic. Vital signs are reassuring. Initial workup largely reassuring with the exception of a d-dimer of 1.44.  Patient did recently have a CT in mid July that was negative for PE. At that time she did have some nodules which it was recommended for her to have further surveillance in 3 months. Because of this and the persistently elevated d-dimer, repeat CT was obtained. No evidence of PE and nodularities appear stable.  Workup including troponin is reassuring. Patient reports that she is "calm down." She is no longer symptomatic. Suspect the patient may need beta blockade. Heart rate is in the 80s to 90s. Her primary physician took her off the metoprolol. Will reinitiate metoprolol 0.5 mg twice a day. This was discussed with cardiology, Dr. Aundra Dubin. Patient to follow-up closely with Dr. Angelena Form.  After history, exam, and medical workup I feel the patient has been appropriately medically screened and is safe for discharge home. Pertinent diagnoses were discussed with the patient. Patient was given return precautions.  I personally performed the services described in this documentation, which was scribed in my presence. The recorded information has been reviewed and is accurate.    Merryl Hacker, MD 11/03/14 [phone]

## 2014-11-02 NOTE — ED Notes (Signed)
Pt in EMS from home, reports palpitations, present for past 2-3 weeks. Denies CP, SOB, N/V, syncope. Given 324 ASA, 1 NTG. Recently stopped taking metoprolol and lisinopril

## 2014-11-03 ENCOUNTER — Encounter (HOSPITAL_COMMUNITY): Payer: Self-pay | Admitting: Radiology

## 2014-11-03 ENCOUNTER — Emergency Department (HOSPITAL_COMMUNITY): Payer: Medicare Other

## 2014-11-03 ENCOUNTER — Telehealth: Payer: Self-pay | Admitting: Cardiovascular Disease

## 2014-11-03 DIAGNOSIS — R002 Palpitations: Secondary | ICD-10-CM | POA: Diagnosis not present

## 2014-11-03 LAB — CBC
HEMATOCRIT: 35.7 % — AB (ref 36.0–46.0)
Hemoglobin: 11.9 g/dL — ABNORMAL LOW (ref 12.0–15.0)
MCH: 31.5 pg (ref 26.0–34.0)
MCHC: 33.3 g/dL (ref 30.0–36.0)
MCV: 94.4 fL (ref 78.0–100.0)
PLATELETS: 200 10*3/uL (ref 150–400)
RBC: 3.78 MIL/uL — ABNORMAL LOW (ref 3.87–5.11)
RDW: 12.8 % (ref 11.5–15.5)
WBC: 6.4 10*3/uL (ref 4.0–10.5)

## 2014-11-03 LAB — TROPONIN I: Troponin I: <0.03 ng/mL (ref <0.031)

## 2014-11-03 LAB — BASIC METABOLIC PANEL
Anion gap: 7 (ref 5–15)
BUN: 18 mg/dL (ref 6–20)
CHLORIDE: 104 mmol/L (ref 101–111)
CO2: 27 mmol/L (ref 22–32)
CREATININE: 0.99 mg/dL (ref 0.44–1.00)
Calcium: 9.6 mg/dL (ref 8.9–10.3)
GFR calc Af Amer: 54 mL/min — ABNORMAL LOW (ref >60)
GFR calc non Af Amer: 47 mL/min — ABNORMAL LOW (ref >60)
GLUCOSE: 96 mg/dL (ref 65–99)
POTASSIUM: 4.2 mmol/L (ref 3.5–5.1)
Sodium: 138 mmol/L (ref 135–145)

## 2014-11-03 LAB — DIGOXIN LEVEL: Digoxin Level: <0.2 ng/mL — ABNORMAL LOW (ref 0.8–2.0)

## 2014-11-03 LAB — D-DIMER, QUANTITATIVE: D-Dimer, Quant: 1.44 ug/mL-FEU — ABNORMAL HIGH (ref 0.00–0.48)

## 2014-11-03 LAB — BRAIN NATRIURETIC PEPTIDE: B Natriuretic Peptide: 89.5 pg/mL (ref 0.0–100.0)

## 2014-11-03 MED ORDER — IOHEXOL 350 MG/ML SOLN
80.0000 mL | Freq: Once | INTRAVENOUS | Status: AC | PRN
  Administered 2014-11-03: 100 mL via INTRAVENOUS

## 2014-11-03 MED ORDER — METOPROLOL TARTRATE 25 MG PO TABS: 12.5000 mg | ORAL_TABLET | Freq: Two times a day (BID) | ORAL | Status: DC

## 2014-11-03 NOTE — Telephone Encounter (Signed)
Scheduled appointment, patient aware of date and time

## 2014-11-03 NOTE — Telephone Encounter (Signed)
Spoke with patient and she was seen in ED recently for palpitations  She was started back on her Metoprolol 25 mg 1/2 tablet by mouth twice a day, PCP had d/c secondary to weakness Patient has a blood pressure monitor at home but it needs new batteries so no blood pressures available  ED recommended follow up with Ivery Quale appointment with PA/NP but patient would prefer seeing Dr Angelena Form Did explain to patient that if she chooses to see Angelena Form only she may have to wait until her scheduled ov 11/20/14 secondary to no availabilities Will forward to Dr Angelena Form and Gilford Rile RN for review

## 2014-11-03 NOTE — Telephone Encounter (Signed)
I could see her Thursday the 22nd at 3 or 3:15.

## 2014-11-03 NOTE — Telephone Encounter (Signed)
New problem    Pt was seen at ER and which to speak to nurse concerning her visit. Please call pt.

## 2014-11-03 NOTE — ED Notes (Signed)
E-sig not working, pt verbalized understanding of DC instructions and prescriptions 

## 2014-11-03 NOTE — Discharge Instructions (Signed)
You were seen today for irregular heart beat and concerns for continuing shortness of breath. Your workup is reassuring. You have recently come off your beta blocker and this may be causing increasing palpitations. He will be placed back on metoprolol. It is very important that you follow up closely with Dr. Angelena Form. Regarding her shortness of breath, you repeat imaging and workup is reassuring.  Palpitations A palpitation is the feeling that your heartbeat is irregular or is faster than normal. It may feel like your heart is fluttering or skipping a beat. Palpitations are usually not a serious problem. However, in some cases, you may need further medical evaluation. CAUSES  Palpitations can be caused by:  Smoking.  Caffeine or other stimulants, such as diet pills or energy drinks.  Alcohol.  Stress and anxiety.  Strenuous physical activity.  Fatigue.  Certain medicines.  Heart disease, especially if you have a history of irregular heart rhythms (arrhythmias), such as atrial fibrillation, atrial flutter, or supraventricular tachycardia.  An improperly working pacemaker or defibrillator. DIAGNOSIS  To find the cause of your palpitations, your health care provider will take your medical history and perform a physical exam. Your health care provider may also have you take a test called an ambulatory electrocardiogram (ECG). An ECG records your heartbeat patterns over a 24-hour period. You may also have other tests, such as:  Transthoracic echocardiogram (TTE). During echocardiography, sound waves are used to evaluate how blood flows through your heart.  Transesophageal echocardiogram (TEE).  Cardiac monitoring. This allows your health care provider to monitor your heart rate and rhythm in real time.  Holter monitor. This is a portable device that records your heartbeat and can help diagnose heart arrhythmias. It allows your health care provider to track your heart activity for several  days, if needed.  Stress tests by exercise or by giving medicine that makes the heart beat faster. TREATMENT  Treatment of palpitations depends on the cause of your symptoms and can vary greatly. Most cases of palpitations do not require any treatment other than time, relaxation, and monitoring your symptoms. Other causes, such as atrial fibrillation, atrial flutter, or supraventricular tachycardia, usually require further treatment. HOME CARE INSTRUCTIONS   Avoid:  Caffeinated coffee, tea, soft drinks, diet pills, and energy drinks.  Chocolate.  Alcohol.  Stop smoking if you smoke.  Reduce your stress and anxiety. Things that can help you relax include:  A method of controlling things in your body, such as your heartbeats, with your mind (biofeedback).  Yoga.  Meditation.  Physical activity such as swimming, jogging, or walking.  Get plenty of rest and sleep. SEEK MEDICAL CARE IF:   You continue to have a fast or irregular heartbeat beyond 24 hours.  Your palpitations occur more often. SEEK IMMEDIATE MEDICAL CARE IF:  You have chest pain or shortness of breath.  You have a severe headache.  You feel dizzy or you faint. MAKE SURE YOU:  Understand these instructions.  Will watch your condition.  Will get help right away if you are not doing well or get worse. Document Released: 01/28/2000 Document Revised: 02/04/2013 Document Reviewed: 03/31/2011 [facility] Patient Information 2015 [place], [place]. This information is not intended to replace advice given to you by your health care provider. Make sure you discuss any questions you have with your health care provider.

## 2014-11-05 ENCOUNTER — Ambulatory Visit (INDEPENDENT_AMBULATORY_CARE_PROVIDER_SITE_OTHER): Payer: Medicare Other | Admitting: Cardiovascular Disease

## 2014-11-05 ENCOUNTER — Encounter: Payer: Self-pay | Admitting: Cardiovascular Disease

## 2014-11-05 VITALS — BP 164/70 | HR 73 | Ht 64.0 in | Wt 104.6 lb

## 2014-11-05 DIAGNOSIS — I351 Nonrheumatic aortic (valve) insufficiency: Secondary | ICD-10-CM | POA: Diagnosis not present

## 2014-11-05 DIAGNOSIS — I10 Essential (primary) hypertension: Secondary | ICD-10-CM | POA: Diagnosis not present

## 2014-11-05 DIAGNOSIS — I2510 Atherosclerotic heart disease of native coronary artery without angina pectoris: Secondary | ICD-10-CM

## 2014-11-05 DIAGNOSIS — I491 Atrial premature depolarization: Secondary | ICD-10-CM | POA: Diagnosis not present

## 2014-11-05 NOTE — Patient Instructions (Signed)

## 2014-11-05 NOTE — Progress Notes (Signed)
Chief Complaint  Patient presents with  . Palpitations      History of Present Illness: 81 yo WF with a history of CAD, moderate AI, mild MR, rectal cancer, PAF here today for cardiac follow up. She was admitted 08/2010 with an inferior STEMI. Emergent cath done with placement of a BMS to the RCA for 100% occlusion. She was noted to have a high grade proximal CFX 95%. She was brought back to the cath lab a couple days later for staged PCI. She was treated with BMS to the CFX. Echo 08/27/11: EF 60-65%, mild RAE, mild to moderate AI, mild MR, moderate TR, RVE, PASP 47. She has undergone partial proctectomy with loop colostomy in 11/2010. She developed an abscess with fistula. She had issues with persistent rectovaginal fistula. I saw her 10/02/11 and she had c/o fatigue, dyspnea and palpitations. I arranged an echo which showed normal LV size and function with mild to moderate AI, moderate TR. 48 hour holter monitor showed NSR with PACs and runs of SVT with rare PVCs. No VT or atrial fibrillation noted. I saw her in January 2015 and she was still having palpitations. Metoprolol lowered to 12.5 mg po BID in primary care due to hypotension and then stopped recently. Echo 04/23/14 with normal LV systolic function, moderate AI, mild MR, RVSP 38 mm Hg, moderate TR.   She is here today for follow up. She presented to the ED 11/02/14 with c/o dyspnea and palpitations. No chest pain or SOB. EKG showed sinus rhythm with PACs. She was started back on her metoprolol in the ED.  She is feeling better with less frequent palpitations. Overall feeling weak. Did not tolerate Lisinopril that was started in primary care for HTN.   Primary Care Physician: Claris Gower   Past Medical History  Diagnosis Date  . Hyperlipidemia   . Hypertension   . 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 History of ST elevation myocardial infarction (STEMI) 08-26-2010-- INFERIOR WALL    S/P PCI  BMS IN RCA AND  PROX. CX  . S/P coronary artery stent placement 08/2010    X2  BM  . CAD (coronary artery disease) CARDIOLOGIST-- DR Angelena Form    a. s/p INF STEMI 7/12: tx with BMS to RCA;  b. cath 08/26/10: pLAD 30%, mLAD 50%, D1 40%, pCFX 95%, mRCA occluded;   c. staged PCI of pCFX with BMS;   d. echo 7/12:   EF 60-65%, mild RAE, mild to moderate AI, mild MR, moderate TR, RVE, PASP 47  . PAF (paroxysmal atrial fibrillation)   . Pulmonary nodules BENIGN  PER CT  10-12-2010  . Dyspnea   . Arthritis     FINGERS   . Osteopenia   . Impaired hearing BILATERAL HEARING AIDS  . Heart palpitations PAC'S AND SVT RUN'S  PER CARDIOLOGIST NOTE  . Rectal Cancer 08/2010    adenocarcinoma   S/P PARTIAL PROCTECTOMY (NO CHEMO OR RADIATION)  . S/P colostomy SECONDARY TO RECTOVAGINAL FISTULA  . First degree heart block   . Complication of anesthesia PT STATES "MADE HER FEEL CRAZY"  . PONV (postoperative nausea and vomiting)   . Degeneration of eye     left eye cornea    Past Surgical History  Procedure Laterality Date  . Knee surgery Right 1996  . Flexible sigmoidoscopy N/A 04/02/2012    Procedure: FLEXIBLE SIGMOIDOSCOPY;  Surgeon: Leighton Ruff, MD;  Location: [facility];  Service: Endoscopy;  Laterality: N/A;  .  Partial proctectomy by tem  11-17-2010    RECTAL CANCER  . Laproscopy lysis adhesions/ drainage of pelvic abscess/ diverting loop sigmoid colectomy  11-22-2010    POST OP RECTOVAGINAL FISTULA  . Tympanoplasty Left 12-23-2009  . Stapedectomy  1970'S  . Total hip arthroplasty Right 1992  . Excision benign cyst right breast    . Abdominal hysterectomy  1950's    AND APPENDECTOMY  . Tonsillectomy  CHILD  . Coronary angioplasty with stent placement  08-26-2010  DR [facility]    PCI, BM STENT IN RCA  . Coronary angioplasty with stent placement  08-29-2010  DR [facility]    PCI, BM STENT IN PROXIMAL CIRCUMFLEX  . Release left carpal tunnel/ osteotomy left distal radius  11-10-2009  . Cataract extraction w/  intraocular lens  implant, bilateral    . Transthoracic echocardiogram  10-10-2011    NORMAL LV SIZE WITH MILD FOCAL BASAL SEPTAL HYPERTROPHY/ EF 55-60%/ NORMAL RV SIZE AND LVSF/ BIATRIAL ENLARGEMENT/ MILD TO MODERATE AI  &  TR  . Fistula plug N/A 06/21/2012    Procedure: insertion of FISTULA PLUG;  Surgeon: Leighton Ruff, MD;  Location: [facility];  Service: General;  Laterality: N/A;    Current Outpatient Prescriptions  Medication Sig Dispense Refill  . acetaminophen (TYLENOL) 325 MG tablet Take 650 mg by mouth every 6 (six) hours as needed for mild pain.     Marland Kitchen aspirin 81 MG tablet Take 81 mg by mouth daily.     Marland Kitchen aspirin-acetaminophen-caffeine (EXCEDRIN MIGRAINE) 250-250-65 MG per tablet Take 1 tablet by mouth every 6 (six) hours as needed for headache.    . beta carotene w/minerals (OCUVITE) tablet Take 1 tablet by mouth daily.    . metoprolol tartrate (LOPRESSOR) 25 MG tablet Take 0.5 tablets (12.5 mg total) by mouth 2 (two) times daily. 60 tablet 3  . Multiple Vitamin (MULTIVITAMIN) tablet Take 1 tablet by mouth daily.     . nitroGLYCERIN (NITROSTAT) 0.4 MG SL tablet Place 1 tablet (0.4 mg total) under the tongue every 5 (five) minutes as needed for chest pain (MAX 3 TABLETS). 25 tablet 6  . Polyethyl Glycol-Propyl Glycol (SYSTANE) 0.4-0.3 % GEL Apply 1 drop to eye at bedtime.     Marland Kitchen zolpidem (AMBIEN) 5 MG tablet Take 5 mg by mouth daily as needed. TAKES 1/2 TABLET BY MOUTH AT BEDTIME AS NEEDED FOR SLEEP    . [DISCONTINUED] omeprazole (PRILOSEC) 40 MG capsule Take 40 mg by mouth as needed.     No current facility-administered medications for this visit.    Allergies  Allergen Reactions  . Effexor [Venlafaxine Hydrochloride] Other (See Comments)    UNKNOWN  . Penicillins Shortness Of Breath  . Sulfa Antibiotics Nausea Only  . Dicyclomine Other (See Comments)    Caused confusion  . Hydrocodone Nausea Only    Social History   Social History  . Marital Status:  Widowed    Spouse Name: N/A  . Number of Children: N/A  . Years of Education: N/A   Occupational History  . Not on file.   Social History Main Topics  . Smoking status: Never Smoker   . Smokeless tobacco: Never Used  . Alcohol Use: No  . Drug Use: No  . Sexual Activity: Not on file   Other Topics Concern  . Not on file   Social History Narrative    Family History  Problem Relation Age of Onset  . Kidney disease Mother   . Heart disease  Father   . Cancer Brother     Review of Systems:  As stated in the HPI and otherwise negative.   BP 164/70 mmHg  Pulse 73  Ht 5\' 4"  (1.626 m)  Wt 104 lb 9.6 oz (47.446 kg)  BMI 17.95 kg/m2  SpO2 99%  Physical Examination: General: Well developed, well nourished, NAD HEENT: OP clear, mucus membranes moist SKIN: warm, dry. No rashes. Neuro: No focal deficits Musculoskeletal: Muscle strength 5/5 all ext Psychiatric: Mood and affect normal Neck: No JVD, no carotid bruits, no thyromegaly, no lymphadenopathy. Lungs:Clear bilaterally, no wheezes, rhonci, crackles Cardiovascular: Regular rate and rhythm. No murmurs, gallops or rubs. Abdomen:Soft. Bowel sounds present. Non-tender.  Extremities: No lower extremity edema. Pulses are 2 + in the bilateral DP/PT.  Echo 04/23/14: Left ventricle: The cavity size was normal. There was focal basal hypertrophy. Systolic function was normal. The estimated ejection fraction was in the range of 55% to 60%. Wall motion was normal; there were no regional wall motion abnormalities. Doppler parameters are consistent with abnormal left ventricular relaxation (grade 1 diastolic dysfunction). - Aortic valve: Mildly calcified annulus. Mildly thickened leaflets. There was moderate regurgitation. - Mitral valve: Moderately calcified annulus. There was mild regurgitation. Mean gradient (D): 5 mm Hg. Peak gradient (D): 10 mm Hg. - Left atrium: The atrium was moderately dilated. - Tricuspid  valve: There was moderate regurgitation. - Pulmonary arteries: Systolic pressure was mildly increased. PA peak pressure: 38 mm Hg (S).  EKG:  EKG is not ordered today. The ekg ordered today demonstrates   Recent Labs: 10/01/2014: ALT 17 11/03/2014: B Natriuretic Peptide 89.5; BUN 18; Creatinine, Ser 0.99; Hemoglobin 11.9*; Platelets 200; Potassium 4.2; Sodium 138   Lipid Panel    Component Value Date/Time   CHOL 129 07/31/2011 1010   TRIG 88.0 07/31/2011 1010   HDL 74.20 07/31/2011 1010   CHOLHDL 2 07/31/2011 1010   VLDL 17.6 07/31/2011 1010   LDLCALC 37 07/31/2011 1010     Wt Readings from Last 3 Encounters:  11/05/14 104 lb 9.6 oz (47.446 kg)  09/09/14 115 lb (52.164 kg)  04/15/14 113 lb (51.256 kg)     Other studies Reviewed: Additional studies/ records that were reviewed today include: . Review of the above records demonstrates:    Assessment and Plan:   1. CAD: Stable. No chest pains. Lipids were at goal in 2013 and given age we are not checking anymore. She is not on a statin any longer given age. LV function normal by echo march 2016.    2. PACs/SVT: More frequent palpitations over last few weeks. She was seen in the ED several days ago and had been off of metoprolol. She had PACs on her EKG and on telemetry. She is now back on metoprolol 12.5 mg po BID and feeling better. I have encouraged her to take it TID if she has days with more palpitations.   3. HTN: BP is only slightly elevated. No changes. She did not tolerate Lisinopril due to itching.    4. Aortic insufficiency: Moderate by echo 2016.    Current medicines are reviewed at length with the patient today.  The patient does not have concerns regarding medicines.  The following changes have been made:  no change  Labs/ tests ordered today include:  No orders of the defined types were placed in this encounter.    Disposition:   FU with me in 6 months  Signed, Lauree Chandler, MD 11/05/2014 3:32  PM  [facility] [address], [place], [place]  [address] Phone: [phone]; Fax: [phone]

## 2014-11-09 ENCOUNTER — Telehealth: Payer: Self-pay | Admitting: Cardiovascular Disease

## 2014-11-09 NOTE — Telephone Encounter (Signed)
error 

## 2014-11-20 ENCOUNTER — Ambulatory Visit: Payer: Medicare Other | Admitting: Cardiovascular Disease

## 2015-01-25 ENCOUNTER — Other Ambulatory Visit: Payer: Self-pay | Admitting: Cardiovascular Disease

## 2015-05-19 ENCOUNTER — Telehealth: Payer: Self-pay | Admitting: Cardiovascular Disease

## 2015-05-19 ENCOUNTER — Ambulatory Visit (INDEPENDENT_AMBULATORY_CARE_PROVIDER_SITE_OTHER): Payer: Medicare Other | Admitting: Cardiovascular Disease

## 2015-05-19 ENCOUNTER — Encounter: Payer: Self-pay | Admitting: Cardiovascular Disease

## 2015-05-19 VITALS — BP 144/70 | HR 66 | Ht 64.0 in | Wt 107.1 lb

## 2015-05-19 DIAGNOSIS — I2510 Atherosclerotic heart disease of native coronary artery without angina pectoris: Secondary | ICD-10-CM | POA: Diagnosis not present

## 2015-05-19 DIAGNOSIS — I351 Nonrheumatic aortic (valve) insufficiency: Secondary | ICD-10-CM

## 2015-05-19 DIAGNOSIS — I10 Essential (primary) hypertension: Secondary | ICD-10-CM | POA: Diagnosis not present

## 2015-05-19 DIAGNOSIS — I491 Atrial premature depolarization: Secondary | ICD-10-CM

## 2015-05-19 NOTE — Progress Notes (Signed)
Chief Complaint  Patient presents with  . Follow-up  . Coronary Artery Disease     History of Present Illness: 82 yo WF with a history of CAD, moderate AI, mild MR, rectal cancer, PAF here today for cardiac follow up. She was admitted 08/2010 with an inferior STEMI. Emergent cath done with placement of a BMS to the RCA for 100% occlusion. She was noted to have a high grade proximal CFX 95%. She was brought back to the cath lab a couple days later for staged PCI. She was treated with BMS to the CFX. Echo 08/27/11: EF 60-65%, mild RAE, mild to moderate AI, mild MR, moderate TR, RVE, PASP 47. She has undergone partial proctectomy with loop colostomy in 11/2010. She developed an abscess with fistula. She had issues with persistent rectovaginal fistula. I saw her 10/02/11 and she had c/o fatigue, dyspnea and palpitations. I arranged an echo which showed normal LV size and function with mild to moderate AI, moderate TR. 48 hour holter monitor showed NSR with PACs and runs of SVT with rare PVCs. No VT or atrial fibrillation noted. I saw her in January 2015 and she was still having palpitations. Metoprolol lowered to 12.5 mg po BID in primary care due to hypotension and then stopped recently. Echo 04/23/14 with normal LV systolic function, moderate AI, mild MR, RVSP 38 mm Hg, moderate TR. She presented to the ED 11/02/14 with c/o dyspnea and palpitations. No chest pain or SOB. EKG showed sinus rhythm with PACs. She was started back on her metoprolol in the ED.   She is here today for follow up.  She denies chest pain or dyspnea. She does endorse fatigue but this is unchanged.   Primary Care Physician: Claris Gower   Past Medical History  Diagnosis Date  . Hyperlipidemia   . Hypertension   . 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 History of ST elevation myocardial infarction (STEMI) 08-26-2010-- INFERIOR WALL    S/P PCI  BMS IN RCA AND PROX. CX  . S/P coronary artery stent  placement 08/2010    X2  BM  . CAD (coronary artery disease) CARDIOLOGIST-- DR Angelena Form    a. s/p INF STEMI 7/12: tx with BMS to RCA;  b. cath 08/26/10: pLAD 30%, mLAD 50%, D1 40%, pCFX 95%, mRCA occluded;   c. staged PCI of pCFX with BMS;   d. echo 7/12:   EF 60-65%, mild RAE, mild to moderate AI, mild MR, moderate TR, RVE, PASP 47  . PAF (paroxysmal atrial fibrillation) ([place])   . Pulmonary nodules BENIGN  PER CT  10-12-2010  . Dyspnea   . Arthritis     FINGERS   . Osteopenia   . Impaired hearing BILATERAL HEARING AIDS  . Heart palpitations PAC'S AND SVT RUN'S  PER CARDIOLOGIST NOTE  . Rectal Cancer 08/2010    adenocarcinoma   S/P PARTIAL PROCTECTOMY (NO CHEMO OR RADIATION)  . 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 First degree heart block   . Complication of anesthesia PT STATES "MADE HER FEEL CRAZY"  . PONV (postoperative nausea and vomiting)   . Degeneration of eye     left eye cornea    Past Surgical History  Procedure Laterality Date  . Knee surgery Right 1996  . Flexible sigmoidoscopy N/A 04/02/2012    Procedure: FLEXIBLE SIGMOIDOSCOPY;  Surgeon: Leighton Ruff, MD;  Location: [facility];  Service: Endoscopy;  Laterality: N/A;  . Partial proctectomy by  tem  11-17-2010    RECTAL CANCER  . Laproscopy lysis adhesions/ drainage of pelvic abscess/ diverting loop sigmoid colectomy  11-22-2010    POST OP RECTOVAGINAL FISTULA  . Tympanoplasty Left 12-23-2009  . Stapedectomy  1970'S  . Total hip arthroplasty Right 1992  . Excision benign cyst right breast    . Abdominal hysterectomy  1950's    AND APPENDECTOMY  . Tonsillectomy  CHILD  . Coronary angioplasty with stent placement  08-26-2010  DR [facility]    PCI, BM STENT IN RCA  . Coronary angioplasty with stent placement  08-29-2010  DR [facility]    PCI, BM STENT IN PROXIMAL CIRCUMFLEX  . Release left carpal tunnel/ osteotomy left distal radius  11-10-2009  . Cataract extraction w/ intraocular lens  implant,  bilateral    . Transthoracic echocardiogram  10-10-2011    NORMAL LV SIZE WITH MILD FOCAL BASAL SEPTAL HYPERTROPHY/ EF 55-60%/ NORMAL RV SIZE AND LVSF/ BIATRIAL ENLARGEMENT/ MILD TO MODERATE AI  &  TR  . Fistula plug N/A 06/21/2012    Procedure: insertion of FISTULA PLUG;  Surgeon: Leighton Ruff, MD;  Location: [facility];  Service: General;  Laterality: N/A;    Current Outpatient Prescriptions  Medication Sig Dispense Refill  . acetaminophen (TYLENOL) 325 MG tablet Take 650 mg by mouth every 6 (six) hours as needed for mild pain.     Marland Kitchen aspirin 81 MG tablet Take 81 mg by mouth daily.     Marland Kitchen aspirin-acetaminophen-caffeine (EXCEDRIN MIGRAINE) 250-250-65 MG per tablet Take 1 tablet by mouth every 6 (six) hours as needed for headache.    . beta carotene w/minerals (OCUVITE) tablet Take 1 tablet by mouth daily.    . metoprolol tartrate (LOPRESSOR) 25 MG tablet Take 0.5 tablets (12.5 mg total) by mouth 2 (two) times daily. 60 tablet 3  . Multiple Vitamin (MULTIVITAMIN) tablet Take 1 tablet by mouth daily.     . nitroGLYCERIN (NITROSTAT) 0.4 MG SL tablet Place 1 tablet (0.4 mg total) under the tongue every 5 (five) minutes as needed for chest pain (MAX 3 TABLETS). 25 tablet 6  . Polyethyl Glycol-Propyl Glycol (SYSTANE) 0.4-0.3 % GEL Apply 1 drop to eye at bedtime.     Marland Kitchen zolpidem (AMBIEN) 5 MG tablet Take 5 mg by mouth daily as needed. TAKES 1/2 TABLET BY MOUTH AT BEDTIME AS NEEDED FOR SLEEP    . [DISCONTINUED] omeprazole (PRILOSEC) 40 MG capsule Take 40 mg by mouth as needed.     No current facility-administered medications for this visit.    Allergies  Allergen Reactions  . Effexor [Venlafaxine Hydrochloride] Other (See Comments)    UNKNOWN  . Penicillins Shortness Of Breath  . Sulfa Antibiotics Nausea Only  . Dicyclomine Other (See Comments)    Caused confusion  . Hydrocodone Nausea Only    Social History   Social History  . Marital Status: Widowed    Spouse Name: N/A    . Number of Children: N/A  . Years of Education: N/A   Occupational History  . Not on file.   Social History Main Topics  . Smoking status: Never Smoker   . Smokeless tobacco: Never Used  . Alcohol Use: No  . Drug Use: No  . Sexual Activity: Not on file   Other Topics Concern  . Not on file   Social History Narrative    Family History  Problem Relation Age of Onset  . Kidney disease Mother   . Heart disease Father   .  Cancer Brother     Review of Systems:  As stated in the HPI and otherwise negative.   BP 144/70 mmHg  Pulse 66  Ht 5\' 4"  (1.626 m)  Wt 107 lb 1.9 oz (48.589 kg)  BMI 18.38 kg/m2  Physical Examination: General: Well developed, well nourished, NAD HEENT: OP clear, mucus membranes moist SKIN: warm, dry. No rashes. Neuro: No focal deficits Musculoskeletal: Muscle strength 5/5 all ext Psychiatric: Mood and affect normal Neck: No JVD, no carotid bruits, no thyromegaly, no lymphadenopathy. Lungs:Clear bilaterally, no wheezes, rhonci, crackles Cardiovascular: Regular rate and rhythm. No murmurs, gallops or rubs. Abdomen:Soft. Bowel sounds present. Non-tender.  Extremities: No lower extremity edema. Pulses are 2 + in the bilateral DP/PT.  Echo 04/23/14: Left ventricle: The cavity size was normal. There was focal basal hypertrophy. Systolic function was normal. The estimated ejection fraction was in the range of 55% to 60%. Wall motion was normal; there were no regional wall motion abnormalities. Doppler parameters are consistent with abnormal left ventricular relaxation (grade 1 diastolic dysfunction). - Aortic valve: Mildly calcified annulus. Mildly thickened leaflets. There was moderate regurgitation. - Mitral valve: Moderately calcified annulus. There was mild regurgitation. Mean gradient (D): 5 mm Hg. Peak gradient (D): 10 mm Hg. - Left atrium: The atrium was moderately dilated. - Tricuspid valve: There was moderate  regurgitation. - Pulmonary arteries: Systolic pressure was mildly increased. PA peak pressure: 38 mm Hg (S).  EKG:  EKG is not ordered today. The ekg ordered today demonstrates   Recent Labs: 10/01/2014: ALT 17 11/03/2014: B Natriuretic Peptide 89.5; BUN 18; Creatinine, Ser 0.99; Hemoglobin 11.9*; Platelets 200; Potassium 4.2; Sodium 138   Lipid Panel    Component Value Date/Time   CHOL 129 07/31/2011 1010   TRIG 88.0 07/31/2011 1010   HDL 74.20 07/31/2011 1010   CHOLHDL 2 07/31/2011 1010   VLDL 17.6 07/31/2011 1010   LDLCALC 37 07/31/2011 1010     Wt Readings from Last 3 Encounters:  05/19/15 107 lb 1.9 oz (48.589 kg)  11/05/14 104 lb 9.6 oz (47.446 kg)  09/09/14 115 lb (52.164 kg)     Other studies Reviewed: Additional studies/ records that were reviewed today include: . Review of the above records demonstrates:    Assessment and Plan:   1. CAD: Stable. No chest pains. Lipids were at goal in 2013 and given age we are not checking anymore. She is not on a statin any longer given age. LV function normal by echo March 2016.    2. PACs/SVT: Rare. Continue beta blocker.   3. HTN: BP is controlled at home. She did not tolerate Lisinopril due to itching.    4. Aortic insufficiency: Moderate by echo 2016.  Will not repeat given age.   Current medicines are reviewed at length with the patient today.  The patient does not have concerns regarding medicines.  The following changes have been made:  no change  Labs/ tests ordered today include:  No orders of the defined types were placed in this encounter.    Disposition:   FU with me in 6 months  Signed, Lauree Chandler, MD 05/19/2015 10:30 AM    [facility] [place], [place], [place]  [address] Phone: [phone]; Fax: [phone]

## 2015-05-19 NOTE — Telephone Encounter (Signed)
Spoke with pt and answered her question.

## 2015-05-19 NOTE — Patient Instructions (Signed)

## 2015-05-19 NOTE — Telephone Encounter (Signed)
New message       Pt was seen this morning.  She has a question about her medications.  Please call

## 2015-07-03 ENCOUNTER — Emergency Department (HOSPITAL_COMMUNITY): Payer: Medicare Other

## 2015-07-03 ENCOUNTER — Inpatient Hospital Stay (HOSPITAL_COMMUNITY): Payer: Medicare Other

## 2015-07-03 ENCOUNTER — Encounter (HOSPITAL_COMMUNITY): Payer: Self-pay | Admitting: Emergency Medicine

## 2015-07-03 ENCOUNTER — Observation Stay (HOSPITAL_COMMUNITY)
Admission: EM | Admit: 2015-07-03 | Discharge: 2015-07-07 | Disposition: A | Discharge status: Skilled Nursing Facility | Payer: Medicare Other | Attending: Internal Medicine | Admitting: Internal Medicine

## 2015-07-03 DIAGNOSIS — Z79899 Other long term (current) drug therapy: Secondary | ICD-10-CM | POA: Insufficient documentation

## 2015-07-03 DIAGNOSIS — W19XXXA Unspecified fall, initial encounter: Secondary | ICD-10-CM | POA: Diagnosis not present

## 2015-07-03 DIAGNOSIS — E785 Hyperlipidemia, unspecified: Secondary | ICD-10-CM | POA: Diagnosis present

## 2015-07-03 DIAGNOSIS — S82451A Displaced comminuted fracture of shaft of right fibula, initial encounter for closed fracture: Secondary | ICD-10-CM | POA: Insufficient documentation

## 2015-07-03 DIAGNOSIS — I252 Old myocardial infarction: Secondary | ICD-10-CM | POA: Insufficient documentation

## 2015-07-03 DIAGNOSIS — I480 Paroxysmal atrial fibrillation: Secondary | ICD-10-CM | POA: Insufficient documentation

## 2015-07-03 DIAGNOSIS — I2510 Atherosclerotic heart disease of native coronary artery without angina pectoris: Secondary | ICD-10-CM | POA: Diagnosis present

## 2015-07-03 DIAGNOSIS — S82251A Displaced comminuted fracture of shaft of right tibia, initial encounter for closed fracture: Secondary | ICD-10-CM | POA: Diagnosis not present

## 2015-07-03 DIAGNOSIS — Z7982 Long term (current) use of aspirin: Secondary | ICD-10-CM | POA: Diagnosis not present

## 2015-07-03 DIAGNOSIS — Z01818 Encounter for other preprocedural examination: Secondary | ICD-10-CM

## 2015-07-03 DIAGNOSIS — I10 Essential (primary) hypertension: Secondary | ICD-10-CM | POA: Diagnosis not present

## 2015-07-03 DIAGNOSIS — Z85048 Personal history of other malignant neoplasm of rectum, rectosigmoid junction, and anus: Secondary | ICD-10-CM | POA: Insufficient documentation

## 2015-07-03 DIAGNOSIS — S82209A Unspecified fracture of shaft of unspecified tibia, initial encounter for closed fracture: Secondary | ICD-10-CM | POA: Diagnosis present

## 2015-07-03 DIAGNOSIS — Z880 Allergy status to penicillin: Secondary | ICD-10-CM | POA: Diagnosis not present

## 2015-07-03 DIAGNOSIS — S82409A Unspecified fracture of shaft of unspecified fibula, initial encounter for closed fracture: Secondary | ICD-10-CM | POA: Diagnosis present

## 2015-07-03 DIAGNOSIS — S8291XA Unspecified fracture of right lower leg, initial encounter for closed fracture: Secondary | ICD-10-CM | POA: Diagnosis not present

## 2015-07-03 LAB — BASIC METABOLIC PANEL
Anion gap: 11 (ref 5–15)
BUN: 19 mg/dL (ref 6–20)
CALCIUM: 9.3 mg/dL (ref 8.9–10.3)
CHLORIDE: 104 mmol/L (ref 101–111)
CO2: 23 mmol/L (ref 22–32)
CREATININE: 0.8 mg/dL (ref 0.44–1.00)
GFR calc Af Amer: >60 mL/min (ref >60)
GFR calc non Af Amer: 60 mL/min — ABNORMAL LOW (ref >60)
GLUCOSE: 122 mg/dL — AB (ref 65–99)
Potassium: 4.1 mmol/L (ref 3.5–5.1)
Sodium: 138 mmol/L (ref 135–145)

## 2015-07-03 LAB — TYPE AND SCREEN
ABO/RH(D): O POS
Antibody Screen: NEGATIVE

## 2015-07-03 LAB — CBC WITH DIFFERENTIAL/PLATELET
Basophils Absolute: 0 10*3/uL (ref 0.0–0.1)
Basophils Relative: 0 %
Eosinophils Absolute: 0 10*3/uL (ref 0.0–0.7)
Eosinophils Relative: 0 %
HEMATOCRIT: 35.1 % — AB (ref 36.0–46.0)
HEMOGLOBIN: 11.4 g/dL — AB (ref 12.0–15.0)
LYMPHS ABS: 0.9 10*3/uL (ref 0.7–4.0)
LYMPHS PCT: 8 %
MCH: 30.4 pg (ref 26.0–34.0)
MCHC: 32.5 g/dL (ref 30.0–36.0)
MCV: 93.6 fL (ref 78.0–100.0)
MONOS PCT: 4 %
Monocytes Absolute: 0.5 10*3/uL (ref 0.1–1.0)
NEUTROS ABS: 10.3 10*3/uL — AB (ref 1.7–7.7)
NEUTROS PCT: 88 %
Platelets: 177 10*3/uL (ref 150–400)
RBC: 3.75 MIL/uL — ABNORMAL LOW (ref 3.87–5.11)
RDW: 12.6 % (ref 11.5–15.5)
WBC: 11.7 10*3/uL — ABNORMAL HIGH (ref 4.0–10.5)

## 2015-07-03 LAB — PROTIME-INR
INR: 0.98 (ref 0.00–1.49)
Prothrombin Time: 13.2 seconds (ref 11.6–15.2)

## 2015-07-03 MED ORDER — ACETAMINOPHEN 500 MG PO TABS
1000.0000 mg | ORAL_TABLET | Freq: Every day | ORAL | Status: DC | PRN
  Administered 2015-07-04 – 2015-07-06 (×6): 1000 mg via ORAL
  Filled 2015-07-03 (×6): qty 2

## 2015-07-03 MED ORDER — METOPROLOL TARTRATE 12.5 MG HALF TABLET
12.5000 mg | ORAL_TABLET | Freq: Two times a day (BID) | ORAL | Status: DC
  Administered 2015-07-03 – 2015-07-07 (×9): 12.5 mg via ORAL
  Filled 2015-07-03 (×8): qty 1

## 2015-07-03 MED ORDER — HYDROCODONE-ACETAMINOPHEN 5-325 MG PO TABS
1.0000 | ORAL_TABLET | Freq: Four times a day (QID) | ORAL | Status: DC | PRN
  Filled 2015-07-03: qty 1

## 2015-07-03 MED ORDER — MORPHINE SULFATE (PF) 2 MG/ML IV SOLN
0.5000 mg | INTRAVENOUS | Status: DC | PRN
  Administered 2015-07-03: 0.5 mg via INTRAVENOUS
  Filled 2015-07-03: qty 1

## 2015-07-03 MED ORDER — PROCHLORPERAZINE EDISYLATE 5 MG/ML IJ SOLN
10.0000 mg | Freq: Once | INTRAMUSCULAR | Status: AC
  Administered 2015-07-03: 10 mg via INTRAVENOUS
  Filled 2015-07-03: qty 2

## 2015-07-03 MED ORDER — ONE-DAILY MULTI VITAMINS PO TABS: 1.0000 | ORAL_TABLET | Freq: Every day | ORAL | Status: DC

## 2015-07-03 MED ORDER — CEFAZOLIN SODIUM 1-5 GM-% IV SOLN
1.0000 g | Freq: Once | INTRAVENOUS | Status: AC
  Administered 2015-07-03: 1 g via INTRAVENOUS
  Filled 2015-07-03: qty 50

## 2015-07-03 MED ORDER — ARTIFICIAL TEARS OP OINT
TOPICAL_OINTMENT | Freq: Every day | OPHTHALMIC | Status: DC
  Filled 2015-07-03: qty 3.5

## 2015-07-03 MED ORDER — ADULT MULTIVITAMIN W/MINERALS CH
1.0000 | ORAL_TABLET | Freq: Every day | ORAL | Status: DC
  Administered 2015-07-04 – 2015-07-07 (×5): 1 via ORAL
  Filled 2015-07-03 (×4): qty 1

## 2015-07-03 MED ORDER — ASPIRIN EC 325 MG PO TBEC
325.0000 mg | DELAYED_RELEASE_TABLET | Freq: Every day | ORAL | Status: DC
  Administered 2015-07-04 – 2015-07-07 (×5): 325 mg via ORAL
  Filled 2015-07-03 (×4): qty 1

## 2015-07-03 MED ORDER — POLYETHYL GLYCOL-PROPYL GLYCOL 0.4-0.3 % OP GEL: Freq: Every day | OPHTHALMIC | Status: DC

## 2015-07-03 NOTE — ED Notes (Signed)
Dr. Darl Householder at the bedside.

## 2015-07-03 NOTE — Progress Notes (Signed)
Orthopedic Tech Progress Note Patient Details:  Itcel Buttry [facility] Jun 11, 1916 [id number] Applied fiberglass posterior short leg and fiberglass stirrup splint to RLE.  Pulses, sensation, motion intact before and after splinting.  Capillary refill less than 2 seconds before and after splinting.  Pt. has order for OHF with trapeze but is unable to use same. Ortho Devices Type of Ortho Device: Stirrup splint, Short leg splint Ortho Device/Splint Location: RLE Ortho Device/Splint Interventions: Application   Darrol Poke 07/03/2015, 10:10 PM

## 2015-07-03 NOTE — H&P (Signed)
History and Physical    [name] [id number] DOB: February 18, 1916 DOA: 07/03/2015  Referring MD/NP/PA: Dr. Darl Householder PCP: Leonard Downing, MD Outpatient Specialists: [facility] cards Patient coming from: ED  Chief Complaint: Fall, leg pain  HPI: MICKENZIE Hamilton is a 82 y.o. female with medical history significant of CAD, STEMI in 2012, got 2 stents.  A.Fib not on chronic anticoagulation presumably due to age and fall risk, Aortic regurg.  Patient presents to the ED after a mechanical fall at home.  Patient had immediate R leg pain and inability to bear weight.  ED Course: R tib-fib fracture.  Review of Systems: As per HPI otherwise 10 point review of systems negative.  No recent chest pain or SOB, asymptomatic from cardiac perspective.  Past Medical History  Diagnosis Date  . Hyperlipidemia   . Hypertension   . 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 History of ST elevation myocardial infarction (STEMI) 08-26-2010-- INFERIOR WALL    S/P PCI  BMS IN RCA AND PROX. CX  . S/P coronary artery stent placement 08/2010    X2  BM  . CAD (coronary artery disease) CARDIOLOGIST-- DR Angelena Form    a. s/p INF STEMI 7/12: tx with BMS to RCA;  b. cath 08/26/10: pLAD 30%, mLAD 50%, D1 40%, pCFX 95%, mRCA occluded;   c. staged PCI of pCFX with BMS;   d. echo 7/12:   EF 60-65%, mild RAE, mild to moderate AI, mild MR, moderate TR, RVE, PASP 47  . PAF (paroxysmal atrial fibrillation) ([place])   . Pulmonary nodules BENIGN  PER CT  10-12-2010  . Dyspnea   . Arthritis     FINGERS   . Osteopenia   . Impaired hearing BILATERAL HEARING AIDS  . Heart palpitations PAC'S AND SVT RUN'S  PER CARDIOLOGIST NOTE  . Rectal Cancer 08/2010    adenocarcinoma   S/P PARTIAL PROCTECTOMY (NO CHEMO OR RADIATION)  . 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 First degree heart block   . Complication of anesthesia PT STATES "MADE HER FEEL CRAZY"  . PONV (postoperative nausea and vomiting)    . Degeneration of eye     left eye cornea    Past Surgical History  Procedure Laterality Date  . Knee surgery Right 1996  . Flexible sigmoidoscopy N/A 04/02/2012    Procedure: FLEXIBLE SIGMOIDOSCOPY;  Surgeon: Leighton Ruff, MD;  Location: [facility];  Service: Endoscopy;  Laterality: N/A;  . Partial proctectomy by tem  11-17-2010    RECTAL CANCER  . Laproscopy lysis adhesions/ drainage of pelvic abscess/ diverting loop sigmoid colectomy  11-22-2010    POST OP RECTOVAGINAL FISTULA  . Tympanoplasty Left 12-23-2009  . Stapedectomy  1970'S  . Total hip arthroplasty Right 1992  . Excision benign cyst right breast    . Abdominal hysterectomy  1950's    AND APPENDECTOMY  . Tonsillectomy  CHILD  . Coronary angioplasty with stent placement  08-26-2010  DR [facility]    PCI, BM STENT IN RCA  . Coronary angioplasty with stent placement  08-29-2010  DR [facility]    PCI, BM STENT IN PROXIMAL CIRCUMFLEX  . Release left carpal tunnel/ osteotomy left distal radius  11-10-2009  . Cataract extraction w/ intraocular lens  implant, bilateral    . Transthoracic echocardiogram  10-10-2011    NORMAL LV SIZE WITH MILD FOCAL BASAL SEPTAL HYPERTROPHY/ EF 55-60%/ NORMAL RV SIZE AND LVSF/ BIATRIAL ENLARGEMENT/ MILD TO MODERATE AI  &  TR  .  Fistula plug N/A 06/21/2012    Procedure: insertion of FISTULA PLUG;  Surgeon: Leighton Ruff, MD;  Location: [facility];  Service: General;  Laterality: N/A;     reports that she has never smoked. She has never used smokeless tobacco. She reports that she does not drink alcohol or use illicit drugs.  Allergies  Allergen Reactions  . Effexor [Venlafaxine Hydrochloride] Other (See Comments)    UNKNOWN  . Penicillins Shortness Of Breath    Has patient had a PCN reaction causing immediate rash, facial/tongue/throat swelling, SOB or lightheadedness with hypotension: Unknown Has patient had a PCN reaction causing severe rash involving mucus membranes or skin  necrosis: No Has patient had a PCN reaction that required hospitalization No Has patient had a PCN reaction occurring within the last 10 years: No If all of the above answers are "NO", then may proceed with Cephalosporin use.   . Sulfa Antibiotics Nausea Only  . Dicyclomine Other (See Comments)    Caused confusion  . Hydrocodone Nausea Only    Family History  Problem Relation Age of Onset  . Kidney disease Mother   . Heart disease Father   . Cancer Brother      Prior to Admission medications   Medication Sig Start Date End Date Taking? Authorizing Provider  acetaminophen (TYLENOL) 500 MG tablet Take 1,000 mg by mouth daily as needed for mild pain.   Yes Historical Provider, MD  aspirin 81 MG tablet Take 81 mg by mouth daily.    Yes Historical Provider, MD  aspirin-acetaminophen-caffeine (EXCEDRIN MIGRAINE) [phone] MG per tablet Take 1 tablet by mouth every 6 (six) hours as needed for headache.   Yes Historical Provider, MD  beta carotene w/minerals (OCUVITE) tablet Take 1 tablet by mouth daily.   Yes Historical Provider, MD  metoprolol tartrate (LOPRESSOR) 25 MG tablet Take 0.5 tablets (12.5 mg total) by mouth 2 (two) times daily. 11/03/14  Yes Merryl Hacker, MD  Multiple Vitamin (MULTIVITAMIN) tablet Take 1 tablet by mouth daily.    Yes Historical Provider, MD  nitroGLYCERIN (NITROSTAT) 0.4 MG SL tablet Place 1 tablet (0.4 mg total) under the tongue every 5 (five) minutes as needed for chest pain (MAX 3 TABLETS). 10/31/13  Yes Burnell Blanks, MD  Polyethyl Glycol-Propyl Glycol (SYSTANE) 0.4-0.3 % GEL Apply 1 drop to eye at bedtime.    Yes Historical Provider, MD    Physical Exam: Filed Vitals:   07/03/15 1747 07/03/15 1900 07/03/15 1915 07/03/15 2000  BP: 180/95 154/55 154/55 152/61  Pulse: 85 68 78 78  Temp: 97.7 F (36.5 C)     TempSrc: Oral     Resp: 21 15 16 16   SpO2: 100% 96% 95% 99%      Constitutional: NAD, calm, comfortable Filed Vitals:   07/03/15  1747 07/03/15 1900 07/03/15 1915 07/03/15 2000  BP: 180/95 154/55 154/55 152/61  Pulse: 85 68 78 78  Temp: 97.7 F (36.5 C)     TempSrc: Oral     Resp: 21 15 16 16   SpO2: 100% 96% 95% 99%   Eyes: PERRL, lids and conjunctivae normal ENMT: Mucous membranes are moist. Posterior pharynx clear of any exudate or lesions.Normal dentition.  Neck: normal, supple, no masses, no thyromegaly Respiratory: clear to auscultation bilaterally, no wheezing, no crackles. Normal respiratory effort. No accessory muscle use.  Cardiovascular: Regular rate and rhythm, no murmurs / rubs / gallops. No extremity edema. 2+ pedal pulses. No carotid bruits.  Abdomen: no tenderness, no  masses palpated. No hepatosplenomegaly. Bowel sounds positive.  Musculoskeletal: no clubbing / cyanosis. No joint deformity upper and lower extremities. Good ROM, no contractures. Normal muscle tone.  Skin: no rashes, lesions, ulcers. No induration Neurologic: CN 2-12 grossly intact. Sensation intact, DTR normal. Strength 5/5 in all 4.  Psychiatric: Normal judgment and insight. Alert and oriented x 3. Normal mood.    Labs on Admission: I have personally reviewed following labs and imaging studies  CBC:  Recent Labs Lab 07/03/15 1925  WBC 11.7*  NEUTROABS 10.3*  HGB 11.4*  HCT 35.1*  MCV 93.6  PLT 123XX123   Basic Metabolic Panel:  Recent Labs Lab 07/03/15 1925  NA 138  K 4.1  CL 104  CO2 23  GLUCOSE 122*  BUN 19  CREATININE 0.80  CALCIUM 9.3   GFR: CrCl cannot be calculated (Unknown ideal weight.). Liver Function Tests: No results for input(s): AST, ALT, ALKPHOS, BILITOT, PROT, ALBUMIN in the last 168 hours. No results for input(s): LIPASE, AMYLASE in the last 168 hours. No results for input(s): AMMONIA in the last 168 hours. Coagulation Profile:  Recent Labs Lab 07/03/15 1934  INR 0.98   Cardiac Enzymes: No results for input(s): CKTOTAL, CKMB, CKMBINDEX, TROPONINI in the last 168 hours. BNP (last 3  results) No results for input(s): PROBNP in the last 8760 hours. HbA1C: No results for input(s): HGBA1C in the last 72 hours. CBG: No results for input(s): GLUCAP in the last 168 hours. Lipid Profile: No results for input(s): CHOL, HDL, LDLCALC, TRIG, CHOLHDL, LDLDIRECT in the last 72 hours. Thyroid Function Tests: No results for input(s): TSH, T4TOTAL, FREET4, T3FREE, THYROIDAB in the last 72 hours. Anemia Panel: No results for input(s): VITAMINB12, FOLATE, FERRITIN, TIBC, IRON, RETICCTPCT in the last 72 hours. Urine analysis:    Component Value Date/Time   COLORURINE YELLOW 10/01/2014 1628   APPEARANCEUR CLEAR 10/01/2014 1628   LABSPEC 1.016 10/01/2014 1628   PHURINE 5.5 10/01/2014 1628   GLUCOSEU NEGATIVE 10/01/2014 1628   HGBUR NEGATIVE 10/01/2014 1628   BILIRUBINUR NEGATIVE 10/01/2014 1628   KETONESUR 40* 10/01/2014 1628   PROTEINUR NEGATIVE 10/01/2014 1628   UROBILINOGEN 0.2 10/01/2014 1628   NITRITE NEGATIVE 10/01/2014 1628   LEUKOCYTESUR TRACE* 10/01/2014 1628   Sepsis Labs: @LABRCNTIP (procalcitonin:4,lacticidven:4) )No results found for this or any previous visit (from the past 240 hour(s)).   Radiological Exams on Admission: Dg Tibia/fibula Right  07/03/2015  CLINICAL DATA:  Fall with right lower extremity deformity. EXAM: RIGHT TIBIA AND FIBULA - 2 VIEW COMPARISON:  None. FINDINGS: There is comminuted non articular distal right tibial shaft fracture with 9 mm anterior displacement of the dominant distal fracture fragment and 6 mm overriding of the fracture. There is a comminuted non articular distal right fibular shaft fracture with 7 mm anterior displacement of the dominant distal fracture fragment and 7 mm overriding of the fracture fragments. No evidence of malalignment at the right knee or right ankle. Two intact appearing screws are seen in the right proximal tibial metaepiphysis. Diffuse osteopenia. No focal osseous lesions. IMPRESSION: Comminuted non articular  distal right tibial and distal right fibular shaft fractures as described. Electronically Signed   By: Ilona Sorrel M.D.   On: 07/03/2015 18:51   Ct Head Wo Contrast  07/03/2015  CLINICAL DATA:  Fall.  Pain. EXAM:CT HEAD WITHOUT CONTRAST: EXAM:CT HEAD WITHOUT CONTRAST CT CERVICAL SPINE WITHOUT CONTRAST TECHNIQUE: Multidetector CT imaging of the head and cervical spine was performed following the standard protocol without intravenous contrast. Multiplanar  CT image reconstructions of the cervical spine were also generated. COMPARISON:  November 24, 2013 FINDINGS: CT HEAD FINDINGS Mild fluid remains in the sphenoid sinus, similar to the comparison study. The paranasal sinuses, mastoid air cells, and middle ears are otherwise normal. No bony fractures or soft tissue abnormalities are identified. No subdural, epidural, or subarachnoid hemorrhage. Ventricles and sulci are prominent but stable. Moderate to severe white matter changes are unchanged. No acute cortical ischemia or infarct. No mass, mass effect, or midline shift. The cerebellum, brainstem, and basal cisterns are within normal limits. CT CERVICAL SPINE FINDINGS There is chronic fusion of C3, C4, and C5. No traumatic malalignment. No fractures are identified. Multilevel degenerative changes are noted. Opacities in the posterior lung may apices, likely scarring. IMPRESSION: No acute intracranial abnormality. No fracture or malalignment in the cervical spine. Electronically Signed   By: Dorise Bullion III M.D   On: 07/03/2015 19:33   Ct Cervical Spine Wo Contrast  07/03/2015  CLINICAL DATA:  Fall.  Pain. EXAM:CT HEAD WITHOUT CONTRAST: EXAM:CT HEAD WITHOUT CONTRAST CT CERVICAL SPINE WITHOUT CONTRAST TECHNIQUE: Multidetector CT imaging of the head and cervical spine was performed following the standard protocol without intravenous contrast. Multiplanar CT image reconstructions of the cervical spine were also generated. COMPARISON:  November 24, 2013 FINDINGS:  CT HEAD FINDINGS Mild fluid remains in the sphenoid sinus, similar to the comparison study. The paranasal sinuses, mastoid air cells, and middle ears are otherwise normal. No bony fractures or soft tissue abnormalities are identified. No subdural, epidural, or subarachnoid hemorrhage. Ventricles and sulci are prominent but stable. Moderate to severe white matter changes are unchanged. No acute cortical ischemia or infarct. No mass, mass effect, or midline shift. The cerebellum, brainstem, and basal cisterns are within normal limits. CT CERVICAL SPINE FINDINGS There is chronic fusion of C3, C4, and C5. No traumatic malalignment. No fractures are identified. Multilevel degenerative changes are noted. Opacities in the posterior lung may apices, likely scarring. IMPRESSION: No acute intracranial abnormality. No fracture or malalignment in the cervical spine. Electronically Signed   By: Dorise Bullion III M.D   On: 07/03/2015 19:33    EKG: Independently reviewed.  Assessment/Plan Active Problems:   Tibia/fibula fracture    Tib/fib fracture of R leg -  Hip fx pathway  ASA and SCD for DVT ppx  Dr. Percell Miller to see in AM  NPO after midnight for possible OR  Checking CXR and EKG for pre-op eval     DVT prophylaxis: ASA/SCD Code Status: Full Family Communication: Family at bedside Consults called: Dr. Percell Miller to see in AM Admission status: Admit to inpatient   Etta Quill DO [facility] Pager [phone] from 7PM-7AM  If 7AM-7PM, please contact the day physician for the patient [email or web address] Password [facility]  07/03/2015, 8:36 PM

## 2015-07-03 NOTE — ED Provider Notes (Signed)
CSN: [id number]     Arrival date & time 07/03/15  1731 History   First MD Initiated Contact with Patient 07/03/15 1738     Chief Complaint  Patient presents with  . Fall  . Leg Pain   (Consider location/radiation/quality/duration/timing/severity/associated sxs/prior Treatment) The history is provided by the patient, the EMS personnel and medical records. No language interpreter was used.  The patient is a 82 year old female who presents after mechanical fall. Patient reports she is currently undergoing remodeling of her bathroom. She walked into her bathroom today to take down her shower curtains, and caught her foot on an open heater vent, lost her balance and fell. She had immediate pain and swelling to her distal right lower extremity. She denies hitting her head or loss of consciousness. She takes baby aspirin daily but is not on any other blood thinners. Patient was able to crawl to the other room and use the telephone to call EMS.  Past Medical History  Diagnosis Date  . Hyperlipidemia   . Hypertension   . 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 History of ST elevation myocardial infarction (STEMI) 08-26-2010-- INFERIOR WALL    S/P PCI  BMS IN RCA AND PROX. CX  . S/P coronary artery stent placement 08/2010    X2  BM  . CAD (coronary artery disease) CARDIOLOGIST-- DR Angelena Form    a. s/p INF STEMI 7/12: tx with BMS to RCA;  b. cath 08/26/10: pLAD 30%, mLAD 50%, D1 40%, pCFX 95%, mRCA occluded;   c. staged PCI of pCFX with BMS;   d. echo 7/12:   EF 60-65%, mild RAE, mild to moderate AI, mild MR, moderate TR, RVE, PASP 47  . PAF (paroxysmal atrial fibrillation) ([place])   . Pulmonary nodules BENIGN  PER CT  10-12-2010  . Dyspnea   . Arthritis     FINGERS   . Osteopenia   . Impaired hearing BILATERAL HEARING AIDS  . Heart palpitations PAC'S AND SVT RUN'S  PER CARDIOLOGIST NOTE  . Rectal Cancer 08/2010    adenocarcinoma   S/P PARTIAL PROCTECTOMY (NO CHEMO OR  RADIATION)  . 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 First degree heart block   . Complication of anesthesia PT STATES "MADE HER FEEL CRAZY"  . PONV (postoperative nausea and vomiting)   . Degeneration of eye     left eye cornea   Past Surgical History  Procedure Laterality Date  . Knee surgery Right 1996  . Flexible sigmoidoscopy N/A 04/02/2012    Procedure: FLEXIBLE SIGMOIDOSCOPY;  Surgeon: Leighton Ruff, MD;  Location: [facility];  Service: Endoscopy;  Laterality: N/A;  . Partial proctectomy by tem  11-17-2010    RECTAL CANCER  . Laproscopy lysis adhesions/ drainage of pelvic abscess/ diverting loop sigmoid colectomy  11-22-2010    POST OP RECTOVAGINAL FISTULA  . Tympanoplasty Left 12-23-2009  . Stapedectomy  1970'S  . Total hip arthroplasty Right 1992  . Excision benign cyst right breast    . Abdominal hysterectomy  1950's    AND APPENDECTOMY  . Tonsillectomy  CHILD  . Coronary angioplasty with stent placement  08-26-2010  DR [facility]    PCI, BM STENT IN RCA  . Coronary angioplasty with stent placement  08-29-2010  DR [facility]    PCI, BM STENT IN PROXIMAL CIRCUMFLEX  . Release left carpal tunnel/ osteotomy left distal radius  11-10-2009  . Cataract extraction w/ intraocular lens  implant, bilateral    .  Transthoracic echocardiogram  10-10-2011    NORMAL LV SIZE WITH MILD FOCAL BASAL SEPTAL HYPERTROPHY/ EF 55-60%/ NORMAL RV SIZE AND LVSF/ BIATRIAL ENLARGEMENT/ MILD TO MODERATE AI  &  TR  . Fistula plug N/A 06/21/2012    Procedure: insertion of FISTULA PLUG;  Surgeon: Leighton Ruff, MD;  Location: [place];  Service: General;  Laterality: N/A;   Family History  Problem Relation Age of Onset  . Kidney disease Mother   . Heart disease Father   . Cancer Brother    Social History  Substance Use Topics  . Smoking status: Never Smoker   . Smokeless tobacco: Never Used  . Alcohol Use: No   OB History    No data available     Review of  Systems  Constitutional: Negative for fever and chills.  HENT: Negative for facial swelling and nosebleeds.   Eyes: Negative for visual disturbance.  Respiratory: Negative for cough and shortness of breath.   Cardiovascular: Positive for leg swelling. Negative for chest pain and palpitations.  Gastrointestinal: Negative for nausea, vomiting and abdominal pain.  Endocrine: Negative for polyuria.  Genitourinary: Negative for dysuria and difficulty urinating.  Musculoskeletal: Negative for back pain and neck pain.  Skin: Positive for wound.  Neurological: Negative for dizziness, syncope, weakness and headaches.  Psychiatric/Behavioral: Negative for confusion.      Allergies  Effexor; Penicillins; Sulfa antibiotics; Dicyclomine; and Hydrocodone  Home Medications   Prior to Admission medications   Medication Sig Start Date End Date Taking? Authorizing Provider  acetaminophen (TYLENOL) 325 MG tablet Take 650 mg by mouth every 6 (six) hours as needed for mild pain.     Historical Provider, MD  aspirin 81 MG tablet Take 81 mg by mouth daily.     Historical Provider, MD  aspirin-acetaminophen-caffeine (EXCEDRIN MIGRAINE) [phone] MG per tablet Take 1 tablet by mouth every 6 (six) hours as needed for headache.    Historical Provider, MD  beta carotene w/minerals (OCUVITE) tablet Take 1 tablet by mouth daily.    Historical Provider, MD  metoprolol tartrate (LOPRESSOR) 25 MG tablet Take 0.5 tablets (12.5 mg total) by mouth 2 (two) times daily. 11/03/14   Merryl Hacker, MD  Multiple Vitamin (MULTIVITAMIN) tablet Take 1 tablet by mouth daily.     Historical Provider, MD  nitroGLYCERIN (NITROSTAT) 0.4 MG SL tablet Place 1 tablet (0.4 mg total) under the tongue every 5 (five) minutes as needed for chest pain (MAX 3 TABLETS). 10/31/13   Burnell Blanks, MD  Polyethyl Glycol-Propyl Glycol (SYSTANE) 0.4-0.3 % GEL Apply 1 drop to eye at bedtime.     Historical Provider, MD  zolpidem (AMBIEN) 5  MG tablet Take 5 mg by mouth daily as needed. TAKES 1/2 TABLET BY MOUTH AT BEDTIME AS NEEDED FOR SLEEP 10/20/14   Historical Provider, MD   BP 157/57 mmHg  Pulse 82  Temp(Src) 98 F (36.7 C) (Oral)  Resp 14  SpO2 97% Physical Exam  Constitutional: She is oriented to person, place, and time. She appears well-developed and well-nourished. She appears distressed (moderate distress secondary to nausea.).  HENT:  Head: Normocephalic.  Blood clot removed from right ear canal with small superficial abrasion of ear canal noted. No hemotympanum. No blood in the nares. No malocclusion.  Eyes: EOM are normal. Pupils are equal, round, and reactive to light.  Neck: Normal range of motion. Neck supple.  Cardiovascular: Normal rate, regular rhythm and intact distal pulses.   Pulmonary/Chest: Effort normal and breath  sounds normal. No respiratory distress.  Abdominal: Soft. She exhibits no distension. There is no tenderness.  Colostomy bag in place  Musculoskeletal:  Contusion with superficial abrasion over right anterior shin. Right anterior shin is significantly tender to palpation. DP and PT pulses are 2+ in the right foot. Right lower extremity sensation is intact. Right upper extremity left upper extremity and right left lower extremity are atraumatic. No cervical, thoracic or lumbar tenderness to palpation. No step-offs palpated.  Neurological: She is alert and oriented to person, place, and time. She displays normal reflexes. No cranial nerve deficit. She exhibits normal muscle tone. Coordination normal.  Skin: Skin is warm and dry. No rash noted.  Psychiatric: She has a normal mood and affect.  Nursing note and vitals reviewed.   ED Course  Procedures (including critical care time) Labs Review Labs Reviewed  CBC WITH DIFFERENTIAL/PLATELET - Abnormal; Notable for the following:    WBC 11.7 (*)    RBC 3.75 (*)    Hemoglobin 11.4 (*)    HCT 35.1 (*)    Neutro Abs 10.3 (*)    All other  components within normal limits  BASIC METABOLIC PANEL - Abnormal; Notable for the following:    Glucose, Bld 122 (*)    GFR calc non Af Amer 60 (*)    All other components within normal limits  PROTIME-INR  TYPE AND SCREEN  ABO/RH    Imaging Review Dg Tibia/fibula Right  07/03/2015  CLINICAL DATA:  Fall with right lower extremity deformity. EXAM: RIGHT TIBIA AND FIBULA - 2 VIEW COMPARISON:  None. FINDINGS: There is comminuted non articular distal right tibial shaft fracture with 9 mm anterior displacement of the dominant distal fracture fragment and 6 mm overriding of the fracture. There is a comminuted non articular distal right fibular shaft fracture with 7 mm anterior displacement of the dominant distal fracture fragment and 7 mm overriding of the fracture fragments. No evidence of malalignment at the right knee or right ankle. Two intact appearing screws are seen in the right proximal tibial metaepiphysis. Diffuse osteopenia. No focal osseous lesions. IMPRESSION: Comminuted non articular distal right tibial and distal right fibular shaft fractures as described. Electronically Signed   By: Ilona Sorrel M.D.   On: 07/03/2015 18:51   Ct Head Wo Contrast  07/03/2015  CLINICAL DATA:  Fall.  Pain. EXAM:CT HEAD WITHOUT CONTRAST: EXAM:CT HEAD WITHOUT CONTRAST CT CERVICAL SPINE WITHOUT CONTRAST TECHNIQUE: Multidetector CT imaging of the head and cervical spine was performed following the standard protocol without intravenous contrast. Multiplanar CT image reconstructions of the cervical spine were also generated. COMPARISON:  November 24, 2013 FINDINGS: CT HEAD FINDINGS Mild fluid remains in the sphenoid sinus, similar to the comparison study. The paranasal sinuses, mastoid air cells, and middle ears are otherwise normal. No bony fractures or soft tissue abnormalities are identified. No subdural, epidural, or subarachnoid hemorrhage. Ventricles and sulci are prominent but stable. Moderate to severe white  matter changes are unchanged. No acute cortical ischemia or infarct. No mass, mass effect, or midline shift. The cerebellum, brainstem, and basal cisterns are within normal limits. CT CERVICAL SPINE FINDINGS There is chronic fusion of C3, C4, and C5. No traumatic malalignment. No fractures are identified. Multilevel degenerative changes are noted. Opacities in the posterior lung may apices, likely scarring. IMPRESSION: No acute intracranial abnormality. No fracture or malalignment in the cervical spine. Electronically Signed   By: Dorise Bullion III M.D   On: 07/03/2015 19:33   Ct Cervical  Spine Wo Contrast  07/03/2015  CLINICAL DATA:  Fall.  Pain. EXAM:CT HEAD WITHOUT CONTRAST: EXAM:CT HEAD WITHOUT CONTRAST CT CERVICAL SPINE WITHOUT CONTRAST TECHNIQUE: Multidetector CT imaging of the head and cervical spine was performed following the standard protocol without intravenous contrast. Multiplanar CT image reconstructions of the cervical spine were also generated. COMPARISON:  November 24, 2013 FINDINGS: CT HEAD FINDINGS Mild fluid remains in the sphenoid sinus, similar to the comparison study. The paranasal sinuses, mastoid air cells, and middle ears are otherwise normal. No bony fractures or soft tissue abnormalities are identified. No subdural, epidural, or subarachnoid hemorrhage. Ventricles and sulci are prominent but stable. Moderate to severe white matter changes are unchanged. No acute cortical ischemia or infarct. No mass, mass effect, or midline shift. The cerebellum, brainstem, and basal cisterns are within normal limits. CT CERVICAL SPINE FINDINGS There is chronic fusion of C3, C4, and C5. No traumatic malalignment. No fractures are identified. Multilevel degenerative changes are noted. Opacities in the posterior lung may apices, likely scarring. IMPRESSION: No acute intracranial abnormality. No fracture or malalignment in the cervical spine. Electronically Signed   By: Dorise Bullion III M.D   On:  07/03/2015 19:33   I have personally reviewed and evaluated these images and lab results as part of my medical decision-making.   EKG Interpretation   Date/Time:  Saturday Jul 03 2015 21:56:53 EDT Ventricular Rate:  83 PR Interval:  210 QRS Duration: 116 QT Interval:  413 QTC Calculation: 485 R Axis:   38 Text Interpretation:  Sinus arrhythmia Incomplete left bundle branch block  Anterior Q waves, possibly due to ILBBB No significant change since last  tracing Confirmed by YAO  MD, DAVID ([id number]) on 07/03/2015 10:03:08 PM      MDM   Final diagnoses:  Preop testing   Patient is a 82 year old female with multiple medical comorbidities who presents after mechanical fall, found to have right tibial and fibular fractures.   On presentation ABC's are intact. Patient is in moderate distress secondary to nausea. She became nauseated after given fentanyl by EMS prior to arrival. IV Compazine given with improvement. Full secondary exam performed with areas of tenderness as noted above.   CT head and cervical spine performed and do not show acute injuries. Patient's cervical collar was cleared and she has full range of motion of her neck without any midline tenderness. Plain films of distal right lower extremity reveal comminuted tibial and fibula fractures. Hardware from prior tibial plateau repair that appears intact. Given abrasion over right anterior shin and likely dirty surface, IV Ancef was given. No concern for open fracture at this time. Patient reports her Tdap is up-to-date. Orthopedic surgery was consulted. Recommended placing patient in a short leg splint. Plan is for likely nonoperative treatment. Per orthopedic surgery do not think patient needs more antibiotics at this time. She may continue taking her aspirin daily and does not require any specific anticoagulation. Given patient's multiple medical comorbidities and likely nonoperative treatment with need for physical therapy,  recommended admission to hospitalist service for further evaluation and management. Orthopedics will continue to follow patient and provide definitive recommendations in the morning.   Hospitalist service consulted for admission in triage the patient. Will be admitted for further management.  Patient was seen and discussed with ED attending, Dr. Mariea Clonts, MD 07/04/15 Uncertain, MD 07/07/15 2134

## 2015-07-03 NOTE — ED Notes (Signed)
ekg given to dr. Darl Householder

## 2015-07-03 NOTE — ED Notes (Signed)
Received pt from home with c/o trip and fall. Pt was walking in bathroom when she tripped on an air vent in the floor. Pt has bleeding to right ear, skin tear to right forearm and open fracture to right shin. Pt given 100 mcg fentanyl and 4 mg of zofran by EMS.

## 2015-07-03 NOTE — ED Notes (Signed)
Attempted report, bed assignment changed.

## 2015-07-03 NOTE — ED Notes (Signed)
Ortho tech at the bedside.  

## 2015-07-04 DIAGNOSIS — I10 Essential (primary) hypertension: Secondary | ICD-10-CM | POA: Diagnosis not present

## 2015-07-04 DIAGNOSIS — S82451A Displaced comminuted fracture of shaft of right fibula, initial encounter for closed fracture: Secondary | ICD-10-CM | POA: Diagnosis not present

## 2015-07-04 DIAGNOSIS — S8291XA Unspecified fracture of right lower leg, initial encounter for closed fracture: Secondary | ICD-10-CM

## 2015-07-04 DIAGNOSIS — S82251A Displaced comminuted fracture of shaft of right tibia, initial encounter for closed fracture: Secondary | ICD-10-CM | POA: Diagnosis not present

## 2015-07-04 DIAGNOSIS — I2510 Atherosclerotic heart disease of native coronary artery without angina pectoris: Secondary | ICD-10-CM | POA: Diagnosis not present

## 2015-07-04 DIAGNOSIS — I252 Old myocardial infarction: Secondary | ICD-10-CM | POA: Diagnosis not present

## 2015-07-04 DIAGNOSIS — E785 Hyperlipidemia, unspecified: Secondary | ICD-10-CM

## 2015-07-04 LAB — ABO/RH: ABO/RH(D): O POS

## 2015-07-04 MED ORDER — BACITRACIN-NEOMYCIN-POLYMYXIN OINTMENT TUBE
TOPICAL_OINTMENT | CUTANEOUS | Status: DC | PRN
  Administered 2015-07-04: 18:00:00 via TOPICAL
  Filled 2015-07-04: qty 15

## 2015-07-04 NOTE — Progress Notes (Signed)
Progress Note    [name]  [id number] DOB: [birth year]  DOA: 07/03/2015 PCP: Leonard Downing, MD    Brief Narrative:   [name] is an 82 y.o. female with a PMH of CAD, STEMI in 2012 s/p stent x 2, atrial fibrillation (not on chronic anticoagulation) and aortic regurgitation who was admitted 07/03/15 with a right tibia/fibula fracture after falling.  Assessment/Plan:   Principal Problem:   Tibia/fibula fracture Orthopedic surgeon consulted. Right leg splinted. Kept nothing by mouth for possible surgery.  Active Problems:   CAD (coronary artery disease)/atrial fibrillation Continue metoprolol. Chest x-ray clear. Follow-up 12-lead EKG. Continue aspirin.    Hyperlipidemia Not on therapy.    HTN (hypertension) Continue metoprolol.    Family Communication/Anticipated D/C date and plan/Code Status   DVT prophylaxis: Lovenox ordered. Code Status: Full Code.  Family Communication: Friend/pastor in room. Disposition Plan: Likely will need SNF.   Medical Consultants:    Orthopedic Surgery   Procedures:    Anti-Infectives:   Anti-infectives    Start     Dose/Rate Route Frequency Ordered Stop   07/03/15 1830  ceFAZolin (ANCEF) IVPB 1 g/50 mL premix     1 g 100 mL/hr over 30 Minutes Intravenous  Once 07/03/15 1817 07/03/15 1930      Subjective:    [name] feels well other than leg pain.  No dyspnea.  No chest pain.  No nausea or vomiting.    Objective:    Filed Vitals:   07/03/15 2130 07/03/15 2200 07/03/15 2230 07/04/15 0559  BP: 156/64 129/62 157/57 110/46  Pulse: 75 81 82 80  Temp:   98 F (36.7 C) 97.5 F (36.4 C)  TempSrc:   Oral Oral  Resp: 15 15 14 15   SpO2: 98% 97% 97% 95%   No intake or output data in the 24 hours ending 07/04/15 0848 There were no vitals filed for this visit.  Exam: General exam: Appears calm and comfortable.  Respiratory system: Clear to auscultation. Respiratory effort  normal. Cardiovascular system: S1 & S2 heard, RRR. No JVD,  rubs, gallops or clicks. No murmurs. Gastrointestinal system: Abdomen is nondistended, soft and nontender. No organomegaly or masses felt. Normal bowel sounds heard. Central nervous system: Alert and oriented. No focal neurological deficits. Extremities: No clubbing, edema, or cyanosis. Right lower leg wrapped. Skin: No rashes, lesions or ulcers Psychiatry: Judgement and insight appear normal. Mood & affect appropriate.   Data Reviewed:   I have personally reviewed following labs and imaging studies:  Labs: Basic Metabolic Panel:  Recent Labs Lab 07/03/15 1925  NA 138  K 4.1  CL 104  CO2 23  GLUCOSE 122*  BUN 19  CREATININE 0.80  CALCIUM 9.3   GFR CrCl cannot be calculated (Unknown ideal weight.). Liver Function Tests: No results for input(s): AST, ALT, ALKPHOS, BILITOT, PROT, ALBUMIN in the last 168 hours. No results for input(s): LIPASE, AMYLASE in the last 168 hours. No results for input(s): AMMONIA in the last 168 hours. Coagulation profile  Recent Labs Lab 07/03/15 1934  INR 0.98    CBC:  Recent Labs Lab 07/03/15 1925  WBC 11.7*  NEUTROABS 10.3*  HGB 11.4*  HCT 35.1*  MCV 93.6  PLT 177   Urine analysis:    Component Value Date/Time   COLORURINE YELLOW 10/01/2014 1628   APPEARANCEUR CLEAR 10/01/2014 1628   LABSPEC 1.016 10/01/2014 1628   PHURINE 5.5 10/01/2014 1628   GLUCOSEU NEGATIVE 10/01/2014 1628  HGBUR NEGATIVE 10/01/2014 1628   BILIRUBINUR NEGATIVE 10/01/2014 1628   KETONESUR 40* 10/01/2014 1628   PROTEINUR NEGATIVE 10/01/2014 1628   UROBILINOGEN 0.2 10/01/2014 1628   NITRITE NEGATIVE 10/01/2014 1628   LEUKOCYTESUR TRACE* 10/01/2014 1628   Microbiology No results found for this or any previous visit (from the past 240 hour(s)).  Radiology: Dg Tibia/fibula Right  07/03/2015  CLINICAL DATA:  Fall with right lower extremity deformity. EXAM: RIGHT TIBIA AND FIBULA - 2 VIEW  COMPARISON:  None. FINDINGS: There is comminuted non articular distal right tibial shaft fracture with 9 mm anterior displacement of the dominant distal fracture fragment and 6 mm overriding of the fracture. There is a comminuted non articular distal right fibular shaft fracture with 7 mm anterior displacement of the dominant distal fracture fragment and 7 mm overriding of the fracture fragments. No evidence of malalignment at the right knee or right ankle. Two intact appearing screws are seen in the right proximal tibial metaepiphysis. Diffuse osteopenia. No focal osseous lesions. IMPRESSION: Comminuted non articular distal right tibial and distal right fibular shaft fractures as described. Electronically Signed   By: Ilona Sorrel M.D.   On: 07/03/2015 18:51   Ct Head Wo Contrast  07/03/2015  CLINICAL DATA:  Fall.  Pain. EXAM:CT HEAD WITHOUT CONTRAST: EXAM:CT HEAD WITHOUT CONTRAST CT CERVICAL SPINE WITHOUT CONTRAST TECHNIQUE: Multidetector CT imaging of the head and cervical spine was performed following the standard protocol without intravenous contrast. Multiplanar CT image reconstructions of the cervical spine were also generated. COMPARISON:  November 24, 2013 FINDINGS: CT HEAD FINDINGS Mild fluid remains in the sphenoid sinus, similar to the comparison study. The paranasal sinuses, mastoid air cells, and middle ears are otherwise normal. No bony fractures or soft tissue abnormalities are identified. No subdural, epidural, or subarachnoid hemorrhage. Ventricles and sulci are prominent but stable. Moderate to severe white matter changes are unchanged. No acute cortical ischemia or infarct. No mass, mass effect, or midline shift. The cerebellum, brainstem, and basal cisterns are within normal limits. CT CERVICAL SPINE FINDINGS There is chronic fusion of C3, C4, and C5. No traumatic malalignment. No fractures are identified. Multilevel degenerative changes are noted. Opacities in the posterior lung may apices,  likely scarring. IMPRESSION: No acute intracranial abnormality. No fracture or malalignment in the cervical spine. Electronically Signed   By: Dorise Bullion III M.D   On: 07/03/2015 19:33   Ct Cervical Spine Wo Contrast  07/03/2015  CLINICAL DATA:  Fall.  Pain. EXAM:CT HEAD WITHOUT CONTRAST: EXAM:CT HEAD WITHOUT CONTRAST CT CERVICAL SPINE WITHOUT CONTRAST TECHNIQUE: Multidetector CT imaging of the head and cervical spine was performed following the standard protocol without intravenous contrast. Multiplanar CT image reconstructions of the cervical spine were also generated. COMPARISON:  November 24, 2013 FINDINGS: CT HEAD FINDINGS Mild fluid remains in the sphenoid sinus, similar to the comparison study. The paranasal sinuses, mastoid air cells, and middle ears are otherwise normal. No bony fractures or soft tissue abnormalities are identified. No subdural, epidural, or subarachnoid hemorrhage. Ventricles and sulci are prominent but stable. Moderate to severe white matter changes are unchanged. No acute cortical ischemia or infarct. No mass, mass effect, or midline shift. The cerebellum, brainstem, and basal cisterns are within normal limits. CT CERVICAL SPINE FINDINGS There is chronic fusion of C3, C4, and C5. No traumatic malalignment. No fractures are identified. Multilevel degenerative changes are noted. Opacities in the posterior lung may apices, likely scarring. IMPRESSION: No acute intracranial abnormality. No fracture or malalignment in the  cervical spine. Electronically Signed   By: Dorise Bullion III M.D   On: 07/03/2015 19:33   Dg Chest Port 1 View  07/03/2015  CLINICAL DATA:  82 year old with distal right tibia and fibula fractures, preoperative respiratory evaluation. EXAM: PORTABLE CHEST 1 VIEW COMPARISON:  CTA chest 11/03/2014. Chest x-rays of that same date and earlier. FINDINGS: Linear opacity in the right mid lung in an area of prior pneumonia on the chest CT. Lungs otherwise clear. No  pleural effusions. Cardiac silhouette moderately enlarged, unchanged. IMPRESSION: Linear atelectasis and/or scarring involving the right mid lung. No acute cardiopulmonary disease otherwise. Electronically Signed   By: Evangeline Dakin M.D.   On: 07/03/2015 21:04    Medications:   . artificial tears   Both Eyes QHS  . aspirin EC  325 mg Oral Daily  . metoprolol tartrate  12.5 mg Oral BID  . multivitamin with minerals  1 tablet Oral Daily   Continuous Infusions:   Time spent: 25 minutes.   LOS: 1 day   Yogesh Cominsky  [facility] Pager [phone]. If unable to reach me by pager, please call my cell phone at [phone].  *Please refer to [email or web address], password [email or web address] to get updated schedule on who will round on this patient, as hospitalists switch teams weekly. If 7PM-7AM, please contact night-coverage at [email or web address], password [email or web address] for any overnight needs.  07/04/2015, 8:48 AM

## 2015-07-04 NOTE — Progress Notes (Signed)
ORTHOPAEDIC CONSULTATION  REQUESTING PHYSICIAN: Venetia Maxon Rama, MD  Chief Complaint: Right leg pain  Assessment: Principal Problem:   Tibia/fibula fracture Active Problems:   CAD (coronary artery disease)   Hyperlipidemia   HTN (hypertension)  The patient and images were evaluated by Dr. Alain Marion.  X-ray of the right tibia/fibula shows Comminuted non articular distal right tibial and distal right fibular shaft fractures.    Plan: Given the patient's age, wishes, comorbidities, and fracture characteristics, we'll plan to treat this fracture nonoperatively at this time.  Repeat x-rays in 1 week to assess position and healing. PT/OT/SW evaluation Advance diet Weight Bearing Status: NWB right leg for at least 6 weeks VTE px: Per primary Follow-up the office in 1 week.  HPI: [name] is a 82 y.o. female with a history of CAD/STEMI 2012 with 2 stents and A. fib not on chronic anticoagulation, aortic regurg. She complains of  right leg pain after mechanical fall in her bathroom yesterday when she tripped over a heater vent. She did not hit her head or lose consciousness. She called EMS and presented to the emergency department where images were taken confirming right tibia/fibula fracture.  Orthopedics was called to evaluate.  Past Medical History  Diagnosis Date  . Hyperlipidemia   . Hypertension   . 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 History of ST elevation myocardial infarction (STEMI) 08-26-2010-- INFERIOR WALL    S/P PCI  BMS IN RCA AND PROX. CX  . S/P coronary artery stent placement 08/2010    X2  BM  . CAD (coronary artery disease) CARDIOLOGIST-- DR Angelena Form    a. s/p INF STEMI 7/12: tx with BMS to RCA;  b. cath 08/26/10: pLAD 30%, mLAD 50%, D1 40%, pCFX 95%, mRCA occluded;   c. staged PCI of pCFX with BMS;   d. echo 7/12:   EF 60-65%, mild RAE, mild to moderate AI, mild MR, moderate TR, RVE, PASP 47  . PAF (paroxysmal atrial  fibrillation) ([place])   . Pulmonary nodules BENIGN  PER CT  10-12-2010  . Dyspnea   . Arthritis     FINGERS   . Osteopenia   . Impaired hearing BILATERAL HEARING AIDS  . Heart palpitations PAC'S AND SVT RUN'S  PER CARDIOLOGIST NOTE  . Rectal Cancer 08/2010    adenocarcinoma   S/P PARTIAL PROCTECTOMY (NO CHEMO OR RADIATION)  . 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 First degree heart block   . Complication of anesthesia PT STATES "MADE HER FEEL CRAZY"  . PONV (postoperative nausea and vomiting)   . Degeneration of eye     left eye cornea   Past Surgical History  Procedure Laterality Date  . Knee surgery Right 1996  . Flexible sigmoidoscopy N/A 04/02/2012    Procedure: FLEXIBLE SIGMOIDOSCOPY;  Surgeon: Leighton Ruff, MD;  Location: [facility];  Service: Endoscopy;  Laterality: N/A;  . Partial proctectomy by tem  11-17-2010    RECTAL CANCER  . Laproscopy lysis adhesions/ drainage of pelvic abscess/ diverting loop sigmoid colectomy  11-22-2010    POST OP RECTOVAGINAL FISTULA  . Tympanoplasty Left 12-23-2009  . Stapedectomy  1970'S  . Total hip arthroplasty Right 1992  . Excision benign cyst right breast    . Abdominal hysterectomy  1950's    AND APPENDECTOMY  . Tonsillectomy  CHILD  . Coronary angioplasty with stent placement  08-26-2010  [facility]    PCI, BM STENT IN  RCA  . Coronary angioplasty with stent placement  08-29-2010  DR [facility]    PCI, BM STENT IN PROXIMAL CIRCUMFLEX  . Release left carpal tunnel/ osteotomy left distal radius  11-10-2009  . Cataract extraction w/ intraocular lens  implant, bilateral    . Transthoracic echocardiogram  10-10-2011    NORMAL LV SIZE WITH MILD FOCAL BASAL SEPTAL HYPERTROPHY/ EF 55-60%/ NORMAL RV SIZE AND LVSF/ BIATRIAL ENLARGEMENT/ MILD TO MODERATE AI  &  TR  . Fistula plug N/A 06/21/2012    Procedure: insertion of FISTULA PLUG;  Surgeon: Leighton Ruff, MD;  Location: [facility];  Service: General;   Laterality: N/A;   Social History   Social History  . Marital Status: Widowed    Spouse Name: N/A  . Number of Children: N/A  . Years of Education: N/A   Social History Main Topics  . Smoking status: Never Smoker   . Smokeless tobacco: Never Used  . Alcohol Use: No  . Drug Use: No  . Sexual Activity: Not Asked   Other Topics Concern  . None   Social History Narrative   Family History  Problem Relation Age of Onset  . Kidney disease Mother   . Heart disease Father   . Cancer Brother    Allergies  Allergen Reactions  . Effexor [Venlafaxine Hydrochloride] Other (See Comments)    UNKNOWN  . Penicillins Shortness Of Breath    Has patient had a PCN reaction causing immediate rash, facial/tongue/throat swelling, SOB or lightheadedness with hypotension: Unknown Has patient had a PCN reaction causing severe rash involving mucus membranes or skin necrosis: No Has patient had a PCN reaction that required hospitalization No Has patient had a PCN reaction occurring within the last 10 years: No If all of the above answers are "NO", then may proceed with Cephalosporin use.   . Sulfa Antibiotics Nausea Only  . Dicyclomine Other (See Comments)    Caused confusion  . Hydrocodone Nausea Only   Prior to Admission medications   Medication Sig Start Date End Date Taking? Authorizing Provider  acetaminophen (TYLENOL) 500 MG tablet Take 1,000 mg by mouth daily as needed for mild pain.   Yes Historical Provider, MD  aspirin 81 MG tablet Take 81 mg by mouth daily.    Yes Historical Provider, MD  aspirin-acetaminophen-caffeine (EXCEDRIN MIGRAINE) [phone] MG per tablet Take 1 tablet by mouth every 6 (six) hours as needed for headache.   Yes Historical Provider, MD  beta carotene w/minerals (OCUVITE) tablet Take 1 tablet by mouth daily.   Yes Historical Provider, MD  metoprolol tartrate (LOPRESSOR) 25 MG tablet Take 0.5 tablets (12.5 mg total) by mouth 2 (two) times daily. 11/03/14  Yes  Merryl Hacker, MD  Multiple Vitamin (MULTIVITAMIN) tablet Take 1 tablet by mouth daily.    Yes Historical Provider, MD  nitroGLYCERIN (NITROSTAT) 0.4 MG SL tablet Place 1 tablet (0.4 mg total) under the tongue every 5 (five) minutes as needed for chest pain (MAX 3 TABLETS). 10/31/13  Yes Burnell Blanks, MD  Polyethyl Glycol-Propyl Glycol (SYSTANE) 0.4-0.3 % GEL Apply 1 drop to eye at bedtime.    Yes Historical Provider, MD   Dg Tibia/fibula Right  07/03/2015  CLINICAL DATA:  Fall with right lower extremity deformity. EXAM: RIGHT TIBIA AND FIBULA - 2 VIEW COMPARISON:  None. FINDINGS: There is comminuted non articular distal right tibial shaft fracture with 9 mm anterior displacement of the dominant distal fracture fragment and 6 mm overriding of the  fracture. There is a comminuted non articular distal right fibular shaft fracture with 7 mm anterior displacement of the dominant distal fracture fragment and 7 mm overriding of the fracture fragments. No evidence of malalignment at the right knee or right ankle. Two intact appearing screws are seen in the right proximal tibial metaepiphysis. Diffuse osteopenia. No focal osseous lesions. IMPRESSION: Comminuted non articular distal right tibial and distal right fibular shaft fractures as described. Electronically Signed   By: Ilona Sorrel M.D.   On: 07/03/2015 18:51   Ct Head Wo Contrast  07/03/2015  CLINICAL DATA:  Fall.  Pain. EXAM:CT HEAD WITHOUT CONTRAST: EXAM:CT HEAD WITHOUT CONTRAST CT CERVICAL SPINE WITHOUT CONTRAST TECHNIQUE: Multidetector CT imaging of the head and cervical spine was performed following the standard protocol without intravenous contrast. Multiplanar CT image reconstructions of the cervical spine were also generated. COMPARISON:  November 24, 2013 FINDINGS: CT HEAD FINDINGS Mild fluid remains in the sphenoid sinus, similar to the comparison study. The paranasal sinuses, mastoid air cells, and middle ears are otherwise normal. No  bony fractures or soft tissue abnormalities are identified. No subdural, epidural, or subarachnoid hemorrhage. Ventricles and sulci are prominent but stable. Moderate to severe white matter changes are unchanged. No acute cortical ischemia or infarct. No mass, mass effect, or midline shift. The cerebellum, brainstem, and basal cisterns are within normal limits. CT CERVICAL SPINE FINDINGS There is chronic fusion of C3, C4, and C5. No traumatic malalignment. No fractures are identified. Multilevel degenerative changes are noted. Opacities in the posterior lung may apices, likely scarring. IMPRESSION: No acute intracranial abnormality. No fracture or malalignment in the cervical spine. Electronically Signed   By: Dorise Bullion III M.D   On: 07/03/2015 19:33   Ct Cervical Spine Wo Contrast  07/03/2015  CLINICAL DATA:  Fall.  Pain. EXAM:CT HEAD WITHOUT CONTRAST: EXAM:CT HEAD WITHOUT CONTRAST CT CERVICAL SPINE WITHOUT CONTRAST TECHNIQUE: Multidetector CT imaging of the head and cervical spine was performed following the standard protocol without intravenous contrast. Multiplanar CT image reconstructions of the cervical spine were also generated. COMPARISON:  November 24, 2013 FINDINGS: CT HEAD FINDINGS Mild fluid remains in the sphenoid sinus, similar to the comparison study. The paranasal sinuses, mastoid air cells, and middle ears are otherwise normal. No bony fractures or soft tissue abnormalities are identified. No subdural, epidural, or subarachnoid hemorrhage. Ventricles and sulci are prominent but stable. Moderate to severe white matter changes are unchanged. No acute cortical ischemia or infarct. No mass, mass effect, or midline shift. The cerebellum, brainstem, and basal cisterns are within normal limits. CT CERVICAL SPINE FINDINGS There is chronic fusion of C3, C4, and C5. No traumatic malalignment. No fractures are identified. Multilevel degenerative changes are noted. Opacities in the posterior lung may  apices, likely scarring. IMPRESSION: No acute intracranial abnormality. No fracture or malalignment in the cervical spine. Electronically Signed   By: Dorise Bullion III M.D   On: 07/03/2015 19:33   Dg Chest Port 1 View  07/03/2015  CLINICAL DATA:  82 year old with distal right tibia and fibula fractures, preoperative respiratory evaluation. EXAM: PORTABLE CHEST 1 VIEW COMPARISON:  CTA chest 11/03/2014. Chest x-rays of that same date and earlier. FINDINGS: Linear opacity in the right mid lung in an area of prior pneumonia on the chest CT. Lungs otherwise clear. No pleural effusions. Cardiac silhouette moderately enlarged, unchanged. IMPRESSION: Linear atelectasis and/or scarring involving the right mid lung. No acute cardiopulmonary disease otherwise. Electronically Signed   By: Sherran Needs.D.  On: 07/03/2015 21:04    Positive ROS: All other systems have been reviewed and were otherwise negative with the exception of those mentioned in the HPI and as above.  Objective: Labs cbc  Recent Labs  07/03/15 1925  WBC 11.7*  HGB 11.4*  HCT 35.1*  PLT 177    Labs inflam No results for input(s): CRP in the last 72 hours.  Invalid input(s): ESR  Labs coag  Recent Labs  07/03/15 1934  INR 0.98     Recent Labs  07/03/15 1925  NA 138  K 4.1  CL 104  CO2 23  GLUCOSE 122*  BUN 19  CREATININE 0.80  CALCIUM 9.3    Physical Exam: Filed Vitals:   07/03/15 2230 07/04/15 0559  BP: 157/57 110/46  Pulse: 82 80  Temp: 98 F (36.7 C) 97.5 F (36.4 C)  Resp: 14 15   General: Alert, no acute distress Cardiovascular: No pedal edema Respiratory: No cyanosis, no use of accessory musculature GI: abdomen is soft and non-tender Skin: Warm and dry  Neurologic: Sensation intact distally Psychiatric: Patient is competent for consent with normal mood and affect  MUSCULOSKELETAL:  Right lower extremity in posterior and lateral splint/Ace wrap. Distal sensation intact.  Neurovascularly intact. Compartments soft. Other extremities are atraumatic with painless ROM and NVI.   Prudencio Burly III PA-C 07/04/2015 10:32 AM

## 2015-07-05 DIAGNOSIS — E785 Hyperlipidemia, unspecified: Secondary | ICD-10-CM | POA: Diagnosis not present

## 2015-07-05 DIAGNOSIS — I2510 Atherosclerotic heart disease of native coronary artery without angina pectoris: Secondary | ICD-10-CM | POA: Diagnosis not present

## 2015-07-05 DIAGNOSIS — I10 Essential (primary) hypertension: Secondary | ICD-10-CM | POA: Diagnosis not present

## 2015-07-05 DIAGNOSIS — S82251A Displaced comminuted fracture of shaft of right tibia, initial encounter for closed fracture: Secondary | ICD-10-CM | POA: Diagnosis not present

## 2015-07-05 DIAGNOSIS — S8291XA Unspecified fracture of right lower leg, initial encounter for closed fracture: Secondary | ICD-10-CM | POA: Diagnosis not present

## 2015-07-05 MED ORDER — ENSURE ENLIVE PO LIQD
237.0000 mL | Freq: Every day | ORAL | Status: DC
  Administered 2015-07-05 – 2015-07-07 (×3): 237 mL via ORAL

## 2015-07-05 NOTE — NC FL2 (Signed)
[place] MEDICAID FL2 LEVEL OF CARE SCREENING TOOL     IDENTIFICATION  Patient Name: [name] Birthdate: May 23, 1916 Sex: female Admission Date (Current Location): 07/03/2015  [facility] and [place] Number:  Herbalist and Address:  [facility], [place] [address] [place], [place] [address]      Provider Number: [id number]  Attending Physician Name and Address:  Venetia Maxon Rama, MD  Relative Name and Phone Number:       Current Level of Care: Hospital Recommended Level of Care: [place] Prior Approval Number:    Date Approved/Denied:   PASRR Number: [id number]  Discharge Plan: SNF    Current Diagnoses: Patient Active Problem List   Diagnosis Date Noted  . Tibia/fibula fracture 07/03/2015  . Lung nodule seen on imaging study 09/05/2013  . Exposure to TB 09/05/2013  . Vascular skin changes 07/17/2011  . 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 Anorexia post-op 12/08/2010  . HTN (hypertension) 11/03/2010  . Dizziness 09/15/2010  . CAD (coronary artery disease)   . Hyperlipidemia   . Rectal cancer, pT2uN0(pNX) s/p TEM partial proctectomy 09/07/2010    Orientation RESPIRATION BLADDER Height & Weight     Self  Normal Continent Weight:   Height:     BEHAVIORAL SYMPTOMS/MOOD NEUROLOGICAL BOWEL NUTRITION STATUS      Colostomy Diet (Please see discharge summary.)  AMBULATORY STATUS COMMUNICATION OF NEEDS Skin   Extensive Assist   Surgical wounds                       Personal Care Assistance Level of Assistance  Bathing, Feeding, Dressing Bathing Assistance: Limited assistance Feeding assistance: Independent Dressing Assistance: Limited assistance     Functional Limitations Info             SPECIAL CARE FACTORS FREQUENCY  PT (By licensed PT), OT (By licensed OT)     PT Frequency: 5 OT Frequency: 5            Contractures      Additional Factors Info  Code  Status, Allergies Code Status Info: FULL Allergies Info: Effexor, Penicillins, Sulfa Antibiotics, Dicyclomine, Hydrocodone           Current Medications (07/05/2015):  This is the current hospital active medication list Current Facility-Administered Medications  Medication Dose Route Frequency Provider Last Rate Last Dose  . acetaminophen (TYLENOL) tablet 1,000 mg  1,000 mg Oral Daily PRN Etta Quill, DO   1,000 mg at 07/05/15 0744  . artificial tears (LACRILUBE) ophthalmic ointment   Both Eyes QHS Etta Quill, DO      . aspirin EC tablet 325 mg  325 mg Oral Daily Etta Quill, DO   325 mg at 07/05/15 1000  . feeding supplement (ENSURE ENLIVE) (ENSURE ENLIVE) liquid 237 mL  237 mL Oral Q1500 Venetia Maxon Rama, MD      . HYDROcodone-acetaminophen (NORCO/VICODIN) 5-325 MG per tablet 1-2 tablet  1-2 tablet Oral Q6H PRN Etta Quill, DO      . metoprolol tartrate (LOPRESSOR) tablet 12.5 mg  12.5 mg Oral BID Etta Quill, DO   12.5 mg at 07/05/15 1000  . morphine 2 MG/ML injection 0.5 mg  0.5 mg Intravenous Q2H PRN Etta Quill, DO   0.5 mg at 07/03/15 2146  . multivitamin with minerals tablet 1 tablet  1 tablet Oral Daily Etta Quill, DO   1 tablet  at 07/05/15 1000  . neomycin-bacitracin-polymyxin (NEOSPORIN) ointment   Topical PRN Venetia Maxon Rama, MD         Discharge Medications: Please see discharge summary for a list of discharge medications.  Relevant Imaging Results:  Relevant Lab Results:   Additional Information SSN: [id number]  Caroline Sauger, LCSW

## 2015-07-05 NOTE — Evaluation (Signed)
Occupational Therapy Evaluation Patient Details Name: [name] MRN: [id number] DOB: 11-13-1916 Today's Date: 07/05/2015    History of Present Illness [name] is a 82 y.o. female with medical history significant of CAD, STEMI in 2012, got 2 stents. A.Fib not on chronic anticoagulation presumably due to age and fall risk, Aortic regurg. Patient presents to the ED after a mechanical fall at home.Found to have a tib/fib fracture that is being treated conservatively for now.   Clinical Impression   This 82 yo female admitted with above presents to acute OT with deficits below (see OT problem list below) thus affecting her PLOF of independent with basic and most IADLs. She will benefit from acute OT with follow up OT at SNF to get to a Mod I to independent level to return home alone.    Follow Up Recommendations  SNF    Equipment Recommendations   (TBD next venue)       Precautions / Restrictions Precautions Precautions: Fall Restrictions Weight Bearing Restrictions: Yes RLE Weight Bearing: Non weight bearing      Mobility Bed Mobility Overal bed mobility: Needs Assistance Bed Mobility: Supine to Sit     Supine to sit: Min assist;HOB elevated (use of rail)     General bed mobility comments: A for RLE and sequencing cues  Transfers Overall transfer level: Needs assistance   Transfers: Sit to/from Stand;Squat Pivot Transfers Sit to Stand: Min assist         General transfer comment: Min A for squat pivot going to her left to 3n1 and then Mod A for squat pivot to left from 3n1 to recliner (wider transfer)    Balance Overall balance assessment: Needs assistance Sitting-balance support: Single extremity supported;Feet supported Sitting balance-Leahy Scale: Poor Sitting balance - Comments: Reliant on 1 UE for A in sitting due to shooting pains at times in her RLE   Standing balance support: Bilateral upper extremity supported;During functional  activity Standing balance-Leahy Scale: Poor Standing balance comment: Reliant on Bil UE support in standing                            ADL Overall ADL's : Needs assistance/impaired Eating/Feeding: Independent;Sitting   Grooming: Set up;Sitting   Upper Body Bathing: Set up;Sitting   Lower Body Bathing: Moderate assistance (min A sit<>stand)   Upper Body Dressing : Set up;Sitting   Lower Body Dressing: Maximal assistance (min A sit<>stand)   Toilet Transfer: Moderate assistance;Squat-pivot;BSC     Toileting - Clothing Manipulation Details (indicate cue type and reason): Minimal A sitting for front peri-care and colostomy management (pt normally does this in standing at home); Max A for clothing management with Min A for standing balance while stilll maintaining NWB'ing RLE                       Pertinent Vitals/Pain Pain Assessment: Faces Faces Pain Scale: Hurts little more Pain Location: RLE with movement (especially when her foot rolls inward) Pain Descriptors / Indicators: Sore;Sharp;Shooting;Aching Pain Intervention(s): Limited activity within patient's tolerance;Premedicated before session     Hand Dominance Right   Extremity/Trunk Assessment Upper Extremity Assessment Upper Extremity Assessment: Generalized weakness   Lower Extremity Assessment Lower Extremity Assessment: Defer to PT evaluation       Communication Communication Communication: HOH (but she does well-hearing aids)   Cognition Arousal/Alertness: Awake/alert Behavior During Therapy: WFL for tasks assessed/performed Overall Cognitive Status: Within Functional Limits  for tasks assessed                                Home Living Family/patient expects to be discharged to:: Skilled nursing facility Living Arrangements: Alone                                      Prior Functioning/Environment Level of Independence: Independent        Comments:  including driving to grocery store, church, and doctor    OT Diagnosis: Generalized weakness;Acute pain   OT Problem List: Decreased strength;Decreased range of motion;Impaired balance (sitting and/or standing);Pain;Decreased knowledge of use of DME or AE   OT Treatment/Interventions: Self-care/ADL training;Patient/family education;Balance training;Therapeutic activities;DME and/or AE instruction    OT Goals(Current goals can be found in the care plan section) Acute Rehab OT Goals Patient Stated Goal: to rehab then home OT Goal Formulation: With patient Time For Goal Achievement: 07/12/15 Potential to Achieve Goals: Good  OT Frequency: Min 2X/week   Barriers to D/C: Decreased caregiver support             End of Session Equipment Utilized During Treatment: Gait belt Nurse Communication: Mobility status (NT as well; +2 going to her left as much as possible)  Activity Tolerance: Patient tolerated treatment well (despite pain) Patient left: in chair;with call bell/phone within reach;with chair alarm set   Time: [phone] OT Time Calculation (min): 33 min Charges:  OT General Charges $OT Visit: 1 Procedure OT Evaluation $OT Eval Moderate Complexity: 1 Procedure OT Treatments $Self Care/Home Management : 8-22 mins  Almon Register [id number] 07/05/2015, 9:19 AM

## 2015-07-05 NOTE — Progress Notes (Signed)
Progress Note    [name]  [id number] DOB: 25-Aug-1916  DOA: 07/03/2015 PCP: Leonard Downing, MD    Brief Narrative:   [name] is an 82 y.o. female with a PMH of CAD, STEMI in 2012 s/p stent x 2, atrial fibrillation (not on chronic anticoagulation) and aortic regurgitation who was admitted 07/03/15 with a right tibia/fibula fracture after falling.  Assessment/Plan:   Principal Problem:   Tibia/fibula fracture Orthopedic surgeon consulted. Right leg splinted. At this time, recommendations are to treat the fracture nonoperatively with repeat x-rays in 1 week to assess position and healing. PT/OT and social worker evaluations requested. She should be nonweightbearing to the right leg for at least 6 weeks.  Active Problems:   CAD (coronary artery disease)/atrial fibrillation Continue metoprolol. Chest x-ray clear. Continue aspirin.    Hyperlipidemia Not on therapy.    HTN (hypertension) Continue metoprolol.    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 Code Status: Full Code.  Family Communication: Friend in room. Disposition Plan: Social worker consulted for SNF placement.   Medical Consultants:    Orthopedic Surgery   Procedures:    Anti-Infectives:   None.  Subjective:   [name] feels well other than leg pain.  No dyspnea.  No chest pain.  No nausea or vomiting.    Objective:    Filed Vitals:   07/04/15 0559 07/04/15 1300 07/04/15 2041 07/05/15 0500  BP: 110/46 98/49 109/43 132/52  Pulse: 80 69 73 76  Temp: 97.5 F (36.4 C) 98.6 F (37 C) 98.8 F (37.1 C) 97.6 F (36.4 C)  TempSrc: Oral Oral Oral Oral  Resp: 15 16 16 16   SpO2: 95% 98% 98% 99%    Intake/Output Summary (Last 24 hours) at 07/05/15 0809 Last data filed at 07/04/15 2100  Gross per 24 hour  Intake    240 ml    Output    500 ml  Net   -260 ml   There were no vitals filed for this visit.  Exam: General exam: Appears calm and comfortable.  Respiratory system: Clear to auscultation. Respiratory effort normal. Cardiovascular system: S1 & S2 heard, RRR. No JVD,  rubs, gallops or clicks. No murmurs. Gastrointestinal system: Abdomen is nondistended, soft and nontender. No organomegaly or masses felt. Normal bowel sounds heard. Ostomy left lower abdomen with pink stoma and formed stool in bag. Central nervous system: Alert and oriented. No focal neurological deficits. Extremities: No clubbing, edema, or cyanosis. Right lower leg wrapped. Skin: No rashes, lesions or ulcers Psychiatry: Judgement and insight appear normal. Mood & affect appropriate.   Data Reviewed:   I have personally reviewed following labs and imaging studies:  Labs: Basic Metabolic Panel:  Recent Labs Lab 07/03/15 1925  NA 138  K 4.1  CL 104  CO2 23  GLUCOSE 122*  BUN 19  CREATININE 0.80  CALCIUM 9.3   GFR CrCl cannot be calculated (Unknown ideal weight.). Liver Function Tests: No results for input(s): AST, ALT, ALKPHOS, BILITOT, PROT, ALBUMIN in the last 168 hours. No results for input(s): LIPASE, AMYLASE in the last 168 hours. No results for input(s): AMMONIA in the last 168 hours. Coagulation profile  Recent Labs Lab 07/03/15 1934  INR 0.98    CBC:  Recent Labs Lab 07/03/15 1925  WBC 11.7*  NEUTROABS 10.3*  HGB 11.4*  HCT 35.1*  MCV  93.6  PLT 177   Microbiology No results found for this or any previous visit (from the past 240 hour(s)).  Radiology: Dg Tibia/fibula Right  07/03/2015  CLINICAL DATA:  Fall with right lower extremity deformity. EXAM: RIGHT TIBIA AND FIBULA - 2 VIEW COMPARISON:  None. FINDINGS: There is comminuted non articular distal right tibial shaft fracture with 9 mm anterior displacement of the dominant distal fracture fragment and 6 mm overriding of the fracture. There is a  comminuted non articular distal right fibular shaft fracture with 7 mm anterior displacement of the dominant distal fracture fragment and 7 mm overriding of the fracture fragments. No evidence of malalignment at the right knee or right ankle. Two intact appearing screws are seen in the right proximal tibial metaepiphysis. Diffuse osteopenia. No focal osseous lesions. IMPRESSION: Comminuted non articular distal right tibial and distal right fibular shaft fractures as described. Electronically Signed   By: Ilona Sorrel M.D.   On: 07/03/2015 18:51   Ct Head Wo Contrast  07/03/2015  CLINICAL DATA:  Fall.  Pain. EXAM:CT HEAD WITHOUT CONTRAST: EXAM:CT HEAD WITHOUT CONTRAST CT CERVICAL SPINE WITHOUT CONTRAST TECHNIQUE: Multidetector CT imaging of the head and cervical spine was performed following the standard protocol without intravenous contrast. Multiplanar CT image reconstructions of the cervical spine were also generated. COMPARISON:  November 24, 2013 FINDINGS: CT HEAD FINDINGS Mild fluid remains in the sphenoid sinus, similar to the comparison study. The paranasal sinuses, mastoid air cells, and middle ears are otherwise normal. No bony fractures or soft tissue abnormalities are identified. No subdural, epidural, or subarachnoid hemorrhage. Ventricles and sulci are prominent but stable. Moderate to severe white matter changes are unchanged. No acute cortical ischemia or infarct. No mass, mass effect, or midline shift. The cerebellum, brainstem, and basal cisterns are within normal limits. CT CERVICAL SPINE FINDINGS There is chronic fusion of C3, C4, and C5. No traumatic malalignment. No fractures are identified. Multilevel degenerative changes are noted. Opacities in the posterior lung may apices, likely scarring. IMPRESSION: No acute intracranial abnormality. No fracture or malalignment in the cervical spine. Electronically Signed   By: Dorise Bullion III M.D   On: 07/03/2015 19:33   Ct Cervical Spine Wo  Contrast  07/03/2015  CLINICAL DATA:  Fall.  Pain. EXAM:CT HEAD WITHOUT CONTRAST: EXAM:CT HEAD WITHOUT CONTRAST CT CERVICAL SPINE WITHOUT CONTRAST TECHNIQUE: Multidetector CT imaging of the head and cervical spine was performed following the standard protocol without intravenous contrast. Multiplanar CT image reconstructions of the cervical spine were also generated. COMPARISON:  November 24, 2013 FINDINGS: CT HEAD FINDINGS Mild fluid remains in the sphenoid sinus, similar to the comparison study. The paranasal sinuses, mastoid air cells, and middle ears are otherwise normal. No bony fractures or soft tissue abnormalities are identified. No subdural, epidural, or subarachnoid hemorrhage. Ventricles and sulci are prominent but stable. Moderate to severe white matter changes are unchanged. No acute cortical ischemia or infarct. No mass, mass effect, or midline shift. The cerebellum, brainstem, and basal cisterns are within normal limits. CT CERVICAL SPINE FINDINGS There is chronic fusion of C3, C4, and C5. No traumatic malalignment. No fractures are identified. Multilevel degenerative changes are noted. Opacities in the posterior lung may apices, likely scarring. IMPRESSION: No acute intracranial abnormality. No fracture or malalignment in the cervical spine. Electronically Signed   By: Dorise Bullion III M.D   On: 07/03/2015 19:33   Dg Chest Port 1 View  07/03/2015  CLINICAL DATA:  82 year old with distal right tibia and  fibula fractures, preoperative respiratory evaluation. EXAM: PORTABLE CHEST 1 VIEW COMPARISON:  CTA chest 11/03/2014. Chest x-rays of that same date and earlier. FINDINGS: Linear opacity in the right mid lung in an area of prior pneumonia on the chest CT. Lungs otherwise clear. No pleural effusions. Cardiac silhouette moderately enlarged, unchanged. IMPRESSION: Linear atelectasis and/or scarring involving the right mid lung. No acute cardiopulmonary disease otherwise. Electronically Signed   By:  Evangeline Dakin M.D.   On: 07/03/2015 21:04    Medications:   . artificial tears   Both Eyes QHS  . aspirin EC  325 mg Oral Daily  . metoprolol tartrate  12.5 mg Oral BID  . multivitamin with minerals  1 tablet Oral Daily   Continuous Infusions:   Time spent: 25 minutes.   LOS: 2 days   Susannah Carbin  [facility] Pager [phone]. If unable to reach me by pager, please call my cell phone at [phone].  *Please refer to [email or web address], password [email or web address] to get updated schedule on who will round on this patient, as hospitalists switch teams weekly. If 7PM-7AM, please contact night-coverage at [email or web address], password [email or web address] for any overnight needs.  07/05/2015, 8:09 AM

## 2015-07-05 NOTE — Progress Notes (Signed)
Initial Nutrition Assessment  DOCUMENTATION CODES:   Not applicable  INTERVENTION:  Provide Ensure Enlive po once daily, each supplement provides 350 kcal and 20 grams of protein.  Encourage adequate PO intake.   Recommend obtaining new weight to fully assess weight trends.   NUTRITION DIAGNOSIS:   Increased nutrient needs related to  (healing) as evidenced by estimated needs.  GOAL:   Patient will meet greater than or equal to 90% of their needs  MONITOR:   PO intake, Supplement acceptance, Weight trends, Labs, I & O's  REASON FOR ASSESSMENT:   Consult Hip fracture protocol  ASSESSMENT:   82 y.o. female with medical history significant of CAD, STEMI in 2012, got 2 stents. A.Fib not on chronic anticoagulation presumably due to age and fall risk, Aortic regurg. Patient presents to the ED after a mechanical fall at home.Found to have a tib/fib fracture that is being treated conservatively for now.  Pt reports having a good appetite. Pt reports usually consuming 3 meals a day with an Ensure at least once daily. Current meal completion is 100%. Usual body weight reported to be ~115 lbs pt reports weighing 2 months ago. Noted no new weight recorded. Pt was sitting in chair at bedside during time of visit, thus unable to weigh pt. RD to order Ensure to aid in caloric and protein needs.   Nutrition-Focused physical exam completed. Findings are no fat depletion, severe muscle depletion, and no edema.   Labs and medications reviewed.   Diet Order:  Diet Heart Room service appropriate?: Yes; Fluid consistency:: Thin  Skin:  Reviewed, no issues  Last BM:  5/22-colostomy  Height:   Ht Readings from Last 1 Encounters:  05/19/15 5\' 4"  (1.626 m)    Weight:   Wt Readings from Last 1 Encounters:  05/19/15 107 lb 1.9 oz (48.589 kg)    Ideal Body Weight:  54.5 kg  BMI:  There is no weight on file to calculate BMI.  Estimated Nutritional Needs:   Kcal:   1400-1600  Protein:  50-65 grams  Fluid:  >/= 1.5 L/day  EDUCATION NEEDS:   No education needs identified at this time  Corrin [facility], MS, RD, LDN Pager # [phone] After hours/ weekend pager # [phone]

## 2015-07-05 NOTE — Clinical Social Work Placement (Signed)
   CLINICAL SOCIAL WORK PLACEMENT  NOTE  Date:  07/05/2015  Patient Details  Name: CHARLETTE WARMUTH MRN: [id number] Date of Birth: 07-Sep-1916  Clinical Social Work is seeking post-discharge placement for this patient at the [place] level of care (*CSW will initial, date and re-position this form in  chart as items are completed):  Yes   Patient/family provided with [facility] Work Department's list of facilities offering this level of care within the geographic area requested by the patient (or if unable, by the patient's family).  Yes   Patient/family informed of their freedom to choose among providers that offer the needed level of care, that participate in Medicare, Medicaid or managed care program needed by the patient, have an available bed and are willing to accept the patient.  Yes   Patient/family informed of [facility] ownership interest in [facility] and [facility], as well as of the fact that they are under no obligation to receive care at these facilities.  PASRR submitted to EDS on       PASRR number received on       Existing PASRR number confirmed on 07/05/15     FL2 transmitted to all facilities in geographic area requested by pt/family on 07/05/15     FL2 transmitted to all facilities within larger geographic area on       Patient informed that his/her managed care company has contracts with or will negotiate with certain facilities, including the following:            Patient/family informed of bed offers received.  Patient chooses bed at       Physician recommends and patient chooses bed at      Patient to be transferred to   on  .  Patient to be transferred to facility by       Patient family notified on   of transfer.  Name of family member notified:        PHYSICIAN Please sign FL2     Additional Comment:    _______________________________________________ Caroline Sauger, LCSW 07/05/2015, 4:21 PM

## 2015-07-06 DIAGNOSIS — I10 Essential (primary) hypertension: Secondary | ICD-10-CM | POA: Diagnosis not present

## 2015-07-06 DIAGNOSIS — I2510 Atherosclerotic heart disease of native coronary artery without angina pectoris: Secondary | ICD-10-CM | POA: Diagnosis not present

## 2015-07-06 DIAGNOSIS — S82209A Unspecified fracture of shaft of unspecified tibia, initial encounter for closed fracture: Secondary | ICD-10-CM | POA: Diagnosis present

## 2015-07-06 DIAGNOSIS — S82409A Unspecified fracture of shaft of unspecified fibula, initial encounter for closed fracture: Secondary | ICD-10-CM

## 2015-07-06 DIAGNOSIS — S8291XA Unspecified fracture of right lower leg, initial encounter for closed fracture: Secondary | ICD-10-CM | POA: Diagnosis not present

## 2015-07-06 DIAGNOSIS — S82251A Displaced comminuted fracture of shaft of right tibia, initial encounter for closed fracture: Secondary | ICD-10-CM | POA: Diagnosis not present

## 2015-07-06 DIAGNOSIS — E785 Hyperlipidemia, unspecified: Secondary | ICD-10-CM | POA: Diagnosis not present

## 2015-07-06 MED ORDER — ACETAMINOPHEN 500 MG PO TABS
1000.0000 mg | ORAL_TABLET | Freq: Four times a day (QID) | ORAL | Status: DC | PRN
  Administered 2015-07-06: 1000 mg via ORAL
  Filled 2015-07-06: qty 2

## 2015-07-06 MED ORDER — TRAMADOL HCL 50 MG PO TABS
50.0000 mg | ORAL_TABLET | Freq: Four times a day (QID) | ORAL | Status: DC | PRN
  Administered 2015-07-06 – 2015-07-07 (×2): 50 mg via ORAL
  Filled 2015-07-06 (×2): qty 1

## 2015-07-06 MED ORDER — ASPIRIN 325 MG PO TBEC: 325.0000 mg | DELAYED_RELEASE_TABLET | Freq: Every day | ORAL | Status: DC

## 2015-07-06 MED ORDER — ENSURE ENLIVE PO LIQD: 237.0000 mL | Freq: Every day | ORAL | Status: DC

## 2015-07-06 MED ORDER — TRAMADOL HCL 50 MG PO TABS: 50.0000 mg | ORAL_TABLET | Freq: Four times a day (QID) | ORAL | Status: DC | PRN

## 2015-07-06 MED ORDER — ACETAMINOPHEN 500 MG PO TABS: 1000.0000 mg | ORAL_TABLET | Freq: Four times a day (QID) | ORAL | Status: AC | PRN

## 2015-07-06 MED ORDER — BACITRACIN-NEOMYCIN-POLYMYXIN OINTMENT TUBE: 1.0000 "application " | TOPICAL_OINTMENT | CUTANEOUS | Status: DC | PRN

## 2015-07-06 MED ORDER — HYDROCODONE-ACETAMINOPHEN 5-325 MG PO TABS: 1.0000 | ORAL_TABLET | Freq: Four times a day (QID) | ORAL | Status: DC | PRN

## 2015-07-06 NOTE — Discharge Summary (Addendum)
Physician Discharge Summary  [name] [id number] DOB: [birth year] DOA: 07/03/2015  PCP: Leonard Downing, MD  Admit date: 07/03/2015 Discharge date: 07/06/2015   Recommendations for Outpatient Follow-Up:   1. Will F/U with Dr. Percell Miller in 1 week for repeat right tibia/fibula x-rays to assess position and healing. 2. NWB right leg x 6 weeks.   Discharge Diagnosis:   Principal Problem:    Tibia/fibula fracture Active Problems:    CAD (coronary artery disease)    Hyperlipidemia    HTN (hypertension)    H/O rectal cancer s/p colostomy for treatment of rectovaginal fistula   Discharge disposition:  SNF.  Discharge Condition: Improved.  Diet recommendation: Low sodium, heart healthy.    History of Present Illness:   [name] is an 82 y.o. female with a PMH of CAD, STEMI in 2012 s/p stent x 2, atrial fibrillation (not on chronic anticoagulation) and aortic regurgitation who was admitted 07/03/15 with a right tibia/fibula fracture after falling.   Hospital Course by Problem:   Principal Problem:  Tibia/fibula fracture Orthopedic surgeon consulted. Right leg splinted. At this time, recommendations are to treat the fracture nonoperatively with repeat x-rays in 1 week to assess position and healing. PT/OT and social worker evaluations requested. She should be nonweightbearing to the right leg for at least 6 weeks.  Active Problems:  CAD (coronary artery disease)/atrial fibrillation Continue metoprolol. Chest x-ray clear. Continue aspirin.   Hyperlipidemia Not on therapy.   HTN (hypertension) Continue metoprolol.   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 Edmonia Lynch, Orthopedic Surgery   Discharge Exam:   Filed Vitals:   07/05/15 2036 07/06/15 0422  BP: 123/48 143/65  Pulse: 79 76  Temp: 98.2 F (36.8 C) 98.3 F (36.8 C)  Resp: 16 14   Filed Vitals:   07/05/15 0500 07/05/15 1300 07/05/15 2036 07/06/15 0422  BP: 132/52 152/44 123/48 143/65  Pulse: 76 73 79 76  Temp: 97.6 F (36.4 C) 97.6 F (36.4 C) 98.2 F (36.8 C) 98.3 F (36.8 C)  TempSrc: Oral  Oral Oral  Resp: 16 16 16 14   SpO2: 99% 99% 98% 98%   General exam: Appears calm and comfortable.  Respiratory system: Clear to auscultation. Respiratory effort normal. Cardiovascular system: S1 & S2 heard, RRR. No JVD, rubs, gallops or clicks. No murmurs. Gastrointestinal system: Abdomen is nondistended, soft and nontender. No organomegaly or masses felt. Normal bowel sounds heard. Ostomy left lower abdomen with pink stoma and formed stool in bag. Central nervous system: Alert and oriented. No focal neurological deficits. Extremities: No clubbing, edema, or cyanosis. Right lower leg wrapped. Skin: No rashes, lesions or ulcers Psychiatry: Judgement and insight appear normal. Mood & affect appropriate.   The results of significant diagnostics from this hospitalization (including imaging, microbiology, ancillary and laboratory) are listed below for reference.     Procedures and Diagnostic Studies:   Dg Tibia/fibula Right  07/03/2015  CLINICAL DATA:  Fall with right lower extremity deformity. EXAM: RIGHT TIBIA AND FIBULA - 2 VIEW COMPARISON:  None. FINDINGS: There is comminuted non articular distal right tibial shaft fracture with 9 mm anterior displacement of the dominant distal fracture fragment and 6 mm overriding of the fracture. There is a comminuted non articular distal right fibular shaft fracture with 7 mm anterior displacement of the dominant distal fracture fragment and 7 mm overriding of the fracture fragments. No evidence of malalignment at the right  knee or right ankle. Two intact appearing screws are seen in the right proximal tibial metaepiphysis. Diffuse osteopenia. No focal osseous lesions. IMPRESSION: Comminuted non articular distal right tibial and distal right fibular shaft  fractures as described. Electronically Signed   By: Ilona Sorrel M.D.   On: 07/03/2015 18:51   Ct Head Wo Contrast  07/03/2015  CLINICAL DATA:  Fall.  Pain. EXAM:CT HEAD WITHOUT CONTRAST: EXAM:CT HEAD WITHOUT CONTRAST CT CERVICAL SPINE WITHOUT CONTRAST TECHNIQUE: Multidetector CT imaging of the head and cervical spine was performed following the standard protocol without intravenous contrast. Multiplanar CT image reconstructions of the cervical spine were also generated. COMPARISON:  November 24, 2013 FINDINGS: CT HEAD FINDINGS Mild fluid remains in the sphenoid sinus, similar to the comparison study. The paranasal sinuses, mastoid air cells, and middle ears are otherwise normal. No bony fractures or soft tissue abnormalities are identified. No subdural, epidural, or subarachnoid hemorrhage. Ventricles and sulci are prominent but stable. Moderate to severe white matter changes are unchanged. No acute cortical ischemia or infarct. No mass, mass effect, or midline shift. The cerebellum, brainstem, and basal cisterns are within normal limits. CT CERVICAL SPINE FINDINGS There is chronic fusion of C3, C4, and C5. No traumatic malalignment. No fractures are identified. Multilevel degenerative changes are noted. Opacities in the posterior lung may apices, likely scarring. IMPRESSION: No acute intracranial abnormality. No fracture or malalignment in the cervical spine. Electronically Signed   By: Dorise Bullion III M.D   On: 07/03/2015 19:33   Ct Cervical Spine Wo Contrast  07/03/2015  CLINICAL DATA:  Fall.  Pain. EXAM:CT HEAD WITHOUT CONTRAST: EXAM:CT HEAD WITHOUT CONTRAST CT CERVICAL SPINE WITHOUT CONTRAST TECHNIQUE: Multidetector CT imaging of the head and cervical spine was performed following the standard protocol without intravenous contrast. Multiplanar CT image reconstructions of the cervical spine were also generated. COMPARISON:  November 24, 2013 FINDINGS: CT HEAD FINDINGS Mild fluid remains in the sphenoid  sinus, similar to the comparison study. The paranasal sinuses, mastoid air cells, and middle ears are otherwise normal. No bony fractures or soft tissue abnormalities are identified. No subdural, epidural, or subarachnoid hemorrhage. Ventricles and sulci are prominent but stable. Moderate to severe white matter changes are unchanged. No acute cortical ischemia or infarct. No mass, mass effect, or midline shift. The cerebellum, brainstem, and basal cisterns are within normal limits. CT CERVICAL SPINE FINDINGS There is chronic fusion of C3, C4, and C5. No traumatic malalignment. No fractures are identified. Multilevel degenerative changes are noted. Opacities in the posterior lung may apices, likely scarring. IMPRESSION: No acute intracranial abnormality. No fracture or malalignment in the cervical spine. Electronically Signed   By: Dorise Bullion III M.D   On: 07/03/2015 19:33   Dg Chest Port 1 View  07/03/2015  CLINICAL DATA:  82 year old with distal right tibia and fibula fractures, preoperative respiratory evaluation. EXAM: PORTABLE CHEST 1 VIEW COMPARISON:  CTA chest 11/03/2014. Chest x-rays of that same date and earlier. FINDINGS: Linear opacity in the right mid lung in an area of prior pneumonia on the chest CT. Lungs otherwise clear. No pleural effusions. Cardiac silhouette moderately enlarged, unchanged. IMPRESSION: Linear atelectasis and/or scarring involving the right mid lung. No acute cardiopulmonary disease otherwise. Electronically Signed   By: Evangeline Dakin M.D.   On: 07/03/2015 21:04     Labs:   Basic Metabolic Panel:  Recent Labs Lab 07/03/15 1925  NA 138  K 4.1  CL 104  CO2 23  GLUCOSE 122*  BUN 19  CREATININE 0.80  CALCIUM 9.3   GFR CrCl cannot be calculated (Unknown ideal weight.). Liver Function Tests: No results for input(s): AST, ALT, ALKPHOS, BILITOT, PROT, ALBUMIN in the last 168 hours. No results for input(s): LIPASE, AMYLASE in the last 168 hours. No results  for input(s): AMMONIA in the last 168 hours. Coagulation profile  Recent Labs Lab 07/03/15 1934  INR 0.98    CBC:  Recent Labs Lab 07/03/15 1925  WBC 11.7*  NEUTROABS 10.3*  HGB 11.4*  HCT 35.1*  MCV 93.6  PLT 177    Discharge Instructions:       Discharge Instructions    Call MD for:  extreme fatigue    Complete by:  As directed      Call MD for:  severe uncontrolled pain    Complete by:  As directed      Diet - low sodium heart healthy    Complete by:  As directed      Increase activity slowly    Complete by:  As directed             Medication List    STOP taking these medications        aspirin 81 MG tablet  Replaced by:  aspirin 325 MG EC tablet      TAKE these medications        acetaminophen 500 MG tablet  Commonly known as:  TYLENOL  Take 2 tablets (1,000 mg total) by mouth every 6 (six) hours as needed for mild pain.     aspirin 325 MG EC tablet  Take 1 tablet (325 mg total) by mouth daily.     aspirin-acetaminophen-caffeine 250-250-65 MG tablet  Commonly known as:  EXCEDRIN MIGRAINE  Take 1 tablet by mouth every 6 (six) hours as needed for headache.     beta carotene w/minerals tablet  Take 1 tablet by mouth daily.     feeding supplement (ENSURE ENLIVE) Liqd  Take 237 mLs by mouth daily at 3 pm.     metoprolol tartrate 25 MG tablet  Commonly known as:  LOPRESSOR  Take 0.5 tablets (12.5 mg total) by mouth 2 (two) times daily.     multivitamin tablet  Take 1 tablet by mouth daily.     neomycin-bacitracin-polymyxin Oint  Commonly known as:  NEOSPORIN  Apply 1 application topically as needed for wound care.     nitroGLYCERIN 0.4 MG SL tablet  Commonly known as:  NITROSTAT  Place 1 tablet (0.4 mg total) under the tongue every 5 (five) minutes as needed for chest pain (MAX 3 TABLETS).     SYSTANE 0.4-0.3 % Gel ophthalmic gel  Generic drug:  Polyethyl Glycol-Propyl Glycol  Apply 1 drop to eye at bedtime.     traMADol 50 MG tablet   Commonly known as:  ULTRAM  Take 1 tablet (50 mg total) by mouth every 6 (six) hours as needed for severe pain.       Follow-up Information    Follow up with MURPHY, TIMOTHY D, MD. Go in 1 week.   Specialty:  Orthopedic Surgery   Why:  Office visit for re-evaluation of fracture with x-ray.   Contact information:   [address] [place] [address] [phone]       Follow up In 1 week.      Please follow up.   Why:  Wed May 31st       Follow up with Renette Butters, MD On 07/14/2015.  Specialty:  Orthopedic Surgery   Why:  May 31st at 2:00PM    Contact information:   [address] [place] [address] [phone]        Time coordinating discharge: 35 minutes.  Signed:  Jamieson Hetland  Pager [phone] [facility] 07/06/2015, 2:05 PM

## 2015-07-06 NOTE — Care Management Important Message (Signed)
Important Message  Patient Details  Name: LACOYA DOBOSZ MRN: [id number] Date of Birth: January 26, 1917   Medicare Important Message Given:  Yes    Geanna Divirgilio, Leroy Sea 07/06/2015, 2:16 PM

## 2015-07-06 NOTE — Progress Notes (Signed)
INITIAL EVALUATION  Delayed note signature for service provided on 07/05/15.  Patient is s/p above surgery resulting in functional limitations due to the deficits listed below (see PT Problem List). Patient will benefit from skilled PT to increase their independence and safety with mobility to allow discharge to the venue listed below.      07/05/15 1355  PT Visit Information  Last PT Received On 07/05/15  Assistance Needed +1  History of Present Illness [name] is a 82 y.o. female with medical history significant of CAD, STEMI in 2012, got 2 stents. A.Fib not on chronic anticoagulation presumably due to Hamilton and fall risk, Aortic regurg. Patient presents to the ED after a mechanical fall at home.Found to have a tib/fib fracture that is being treated conservatively for now.  Precautions  Precautions Fall  Restrictions  Weight Bearing Restrictions Yes  RLE Weight Bearing NWB  Home Living  Family/patient expects to be discharged to: [facility]  Additional Comments Pt reports wanting to go to SNF for more therapy before eventually returning home.   Prior Function  Level of Independence Independent  Communication  Communication HOH  Pain Assessment  Pain Assessment No/denies pain (increasing with activity for brief periods.)  Pain Location Rt LE  Pain Descriptors / Indicators Shooting;Sharp  Pain Intervention(s) Limited activity within patient's tolerance;Monitored during session  Cognition  Arousal/Alertness Awake/alert  Behavior During Therapy WFL for tasks assessed/performed  Overall Cognitive Status Within Functional Limits for tasks assessed  Upper Extremity Assessment  Upper Extremity Assessment Defer to OT evaluation  Lower Extremity Assessment  Lower Extremity Assessment Generalized weakness  Bed Mobility  Overal bed mobility Needs Assistance  Bed Mobility Sit to Supine  Supine to sit Min assist  Sit to supine Min  assist (with Rt LE)  Transfers  Overall transfer level Needs assistance  Equipment used Rolling walker (2 wheeled)  Transfers Sit to/from [facility]  Sit to Stand Min assist  Stand pivot transfers Min assist  General transfer comment pt consistent with NWB status.   Ambulation/Gait  General Gait Details Pt taking 1 step forward with hop-to pattern, min assist. Maintaining NWB status  Balance  Overall balance assessment Needs assistance  Sitting-balance support No upper extremity supported  Sitting balance-Leahy Scale Fair  Standing balance support Bilateral upper extremity supported  Standing balance-Leahy Scale Poor  Standing balance comment using rw for support  PT - End of Session  Equipment Utilized During Treatment Gait belt  Activity Tolerance Patient tolerated treatment well  Patient left in bed;with call bell/phone within reach;with bed alarm set;with family/visitor present;with SCD's reapplied (Rt LE elevated. )  Nurse Communication Mobility status;Weight bearing status  PT Assessment  PT Therapy Diagnosis  Difficulty walking;Generalized weakness  PT Recommendation/Assessment Patient needs continued PT services  PT Problem List Decreased strength;Decreased activity tolerance;Decreased balance;Decreased mobility  PT Plan  PT Frequency (ACUTE ONLY) Min 2X/week  PT Treatment/Interventions (ACUTE ONLY) DME instruction;Gait training;Functional mobility training;Therapeutic activities;Therapeutic exercise;Patient/family education  PT Recommendation  Follow Up Recommendations SNF;Supervision for mobility/OOB  PT equipment None recommended by PT (to be addressed at next venue)  Individuals Consulted  Consulted and Agree with Results and Recommendations Patient  Acute Rehab PT Goals  Patient Stated Goal get stronger before going home.  PT Goal Formulation With patient  Time For Goal Achievement 07/19/15  Potential to Achieve Goals Good  PT Time Calculation   PT Start Time (ACUTE ONLY) 1326  PT Stop Time (ACUTE ONLY)  1354  PT Time Calculation (min) (ACUTE ONLY) 28 min  PT General Charges  $$ ACUTE PT VISIT 1 Procedure  PT Evaluation  $PT Eval Moderate Complexity 1 Procedure  PT Treatments  $Therapeutic Activity 8-22 mins   Cassell Clement, PT, CSCS Pager [phone] Office [phone]

## 2015-07-06 NOTE — Progress Notes (Signed)
Physical Therapy Treatment Patient Details Name: [name] MRN: [id number] DOB: November 15, 1916 Today's Date: 07/06/2015    History of Present Illness [name] is a 82 y.o. female with medical history significant of CAD, STEMI in 2012, got 2 stents. A.Fib not on chronic anticoagulation presumably due to age and fall risk, Aortic regurg. Patient presents to the ED after a mechanical fall at home.Found to have a tib/fib fracture that is being treated conservatively for now.    PT Comments    Pt making gradual progress with mobility and motivated to make progress. Recommending SNF following acute stay for further rehabilitation. PT to continue to follow.   Follow Up Recommendations  SNF;Supervision for mobility/OOB     Equipment Recommendations  None recommended by PT    Recommendations for Other Services       Precautions / Restrictions Precautions Precautions: Fall Restrictions Weight Bearing Restrictions: Yes RLE Weight Bearing: Non weight bearing    Mobility  Bed Mobility Overal bed mobility: Needs Assistance Bed Mobility: Supine to Sit     Supine to sit: Min assist     General bed mobility comments: assist provided to Rt LE, pt using rail to assist self to sitting.   Transfers Overall transfer level: Needs assistance Equipment used: Rolling walker (2 wheeled) Transfers: Sit to/from Stand Sit to Stand: Min assist Stand pivot transfers: Min assist       General transfer comment: pt consistent with NWB status.   Ambulation/Gait                 Stairs            Wheelchair Mobility    Modified Rankin (Stroke Patients Only)       Balance Overall balance assessment: Needs assistance Sitting-balance support: No upper extremity supported Sitting balance-Leahy Scale: Good     Standing balance support: Bilateral upper extremity supported Standing balance-Leahy Scale: Poor Standing balance comment: using rw                     Cognition Arousal/Alertness: Awake/alert Behavior During Therapy: WFL for tasks assessed/performed Overall Cognitive Status: Within Functional Limits for tasks assessed                      Exercises      General Comments        Pertinent Vitals/Pain Pain Assessment: Faces (at rest, increasing with activity) Faces Pain Scale: Hurts a little bit Pain Location: Rt LE Pain Descriptors / Indicators: Aching Pain Intervention(s): Limited activity within patient's tolerance;Monitored during session    Home Living                      Prior Function            PT Goals (current goals can now be found in the care plan section) Acute Rehab PT Goals Patient Stated Goal: walk again PT Goal Formulation: With patient Time For Goal Achievement: 07/19/15 Potential to Achieve Goals: Good Progress towards PT goals: Progressing toward goals    Frequency  Min 2X/week    PT Plan Current plan remains appropriate    Co-evaluation             End of Session Equipment Utilized During Treatment: Gait belt Activity Tolerance: Patient tolerated treatment well Patient left: in chair;with call bell/phone within reach;with family/visitor present (Rt LE elevated)     Time: 5625-6389 PT Time Calculation (min) (ACUTE ONLY): 19  min  Charges:  $Therapeutic Activity: 8-22 mins                    G Codes:      Cassell Clement, PT, CSCS Pager [phone] Office [phone]  07/06/2015, 12:00 PM

## 2015-07-06 NOTE — Clinical Social Work Note (Signed)
Clinical Social Work Assessment  Patient Details  Name: [name] MRN: [id number] Date of Birth: 1916/03/11  Date of referral:  07/06/15               Reason for consult:  Facility Placement                Permission sought to share information with:  Chartered certified accountant granted to share information::  Yes, Verbal Permission Granted  Name::        Agency::  [facility]  Relationship::     Contact Information:     Housing/Transportation Living arrangements for the past 2 months:  Single Family Home Source of Information:  Patient Patient Interpreter Needed:  None Criminal Activity/Legal Involvement Pertinent to Current Situation/Hospitalization:  No - Comment as needed Significant Relationships:  Adult Children Lives with:  Self Do you feel safe going back to the place where you live?  No Need for family participation in patient care:  Yes (Comment) (Patient's daughter active in patient's care.)  Care giving concerns:  Patient expressed no concerns at this time.   Social Worker assessment / plan:  LCSW received referral for possible SNF placement at time of discharge. LCSW met with patient and patient's daughter at bedside to confirm discharge plan. Per patient, patient has previously completed short-term rehabilitation at [facility] and would prefer to return at time of discharge. LCSW to continue to follow and assist with discharge planning needs.   Employment status:  Retired Forensic scientist:  [employer]) PT Recommendations:  [place] / Referral to community resources:  [place]  Patient/Family's Response to care:  Patient understanding and agreeable to [employer] of care.  Patient/Family's Understanding of and Emotional Response to Diagnosis, Current Treatment, and Prognosis:  Patient understanding and agreeable to LCSW plan of care.  Emotional  Assessment Appearance:  Appears stated age Attitude/Demeanor/Rapport:  Other (appropriate) Affect (typically observed):  Accepting, Appropriate, Pleasant Orientation:  Oriented to Self, Oriented to Place, Oriented to  Time, Oriented to Situation Alcohol / Substance use:  Not Applicable Psych involvement (Current and /or in the community):  No (Comment) (not appropriate on this admission.)  Discharge Needs  Concerns to be addressed:  No discharge needs identified Readmission within the last 30 days:  No Current discharge risk:  None Barriers to Discharge:  No Barriers Identified   Caroline Sauger, LCSW 07/06/2015, 11:29 AM [phone]

## 2015-07-06 NOTE — Clinical Social Work Placement (Signed)
   CLINICAL SOCIAL WORK PLACEMENT  NOTE  Date:  07/06/2015  Patient Details  Name: [name] MRN: [id number] Date of Birth: May 05, 1916  Clinical Social Work is seeking post-discharge placement for this patient at the [place] level of care (*CSW will initial, date and re-position this form in  chart as items are completed):  Yes   Patient/family provided with [facility] Work Department's list of facilities offering this level of care within the geographic area requested by the patient (or if unable, by the patient's family).  Yes   Patient/family informed of their freedom to choose among providers that offer the needed level of care, that participate in Medicare, Medicaid or managed care program needed by the patient, have an available bed and are willing to accept the patient.  Yes   Patient/family informed of [facility]'s ownership interest in [facility] and [facility], as well as of the fact that they are under no obligation to receive care at these facilities.  PASRR submitted to EDS on       PASRR number received on       Existing PASRR number confirmed on 07/05/15     FL2 transmitted to all facilities in geographic area requested by pt/family on 07/05/15     FL2 transmitted to all facilities within larger geographic area on       Patient informed that his/her managed care company has contracts with or will negotiate with certain facilities, including the following:        Yes   Patient/family informed of bed offers received.  Patient chooses bed at [facility], [place]     Physician recommends and patient chooses bed at      Patient to be transferred to [facility] on 07/06/15.  Patient to be transferred to facility by [facility]     Patient family notified on 07/06/15 of transfer.  Name of family member notified:  Patient and patient's daughter     PHYSICIAN Please sign FL2     Additional Comment:     _______________________________________________ Caroline Sauger, LCSW 07/06/2015, 11:31 AM

## 2015-07-07 DIAGNOSIS — S8291XD Unspecified fracture of right lower leg, subsequent encounter for closed fracture with routine healing: Secondary | ICD-10-CM

## 2015-07-07 DIAGNOSIS — S82251A Displaced comminuted fracture of shaft of right tibia, initial encounter for closed fracture: Secondary | ICD-10-CM | POA: Diagnosis not present

## 2015-07-07 NOTE — Discharge Planning (Addendum)
Patient has been discharged to [facility] yesterday. CM to arrange transfer to facility. Patient is stable for discharge this AM. Necessary orders completed by Dr. Rockne Menghini. Discharge summary completed 07/06/15 as well.  She is doing well today, some pain however does not like to take medications. She states that she took a tramadol in the evening and then acetaminophen prior to bed. She slept very well with this last night. Inquiring if she should take the tramadol more often. I advised that the key is to stay on top of her pain. Therefore, if she knows she will be getting therapy in the day, to take tramadol prior so that the pain is more bearable. Any increases of pain throughout day, she can use acetaminophen. i advised that she may want to try just doing the tramadol twice a day for now until she notices her pain is decreasing. Advised no more than 3000 mg of acetaminophen in a day. She is agreeable with pain plan. She reports no changes with her output in colostomy. 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 She suspects that it's because she's not as active. She follows closely with her primary physicians for these chronic issues.   NAD, calm RRR, S1S2 Nonlabored, CTA B/L abd soft, ND, NT Ext warm, no edema, leg wrapped.   Electronically signed by: Marja Kays, MD 07/07/2015 @ 9:47 AM

## 2015-07-07 NOTE — Discharge Instructions (Signed)
°Cast or Splint Care  ° ° °Casts and splints support injured limbs and keep bones from moving while they heal. It is important to care for your cast or splint at home.  °HOME CARE INSTRUCTIONS  °Keep the cast or splint uncovered during the drying period. It can take 24 to 48 hours to dry if it is made of plaster. A fiberglass cast will dry in less than 1 hour.  °Do not rest the cast on anything harder than a pillow for the first 24 hours.  °Do not put weight on your injured limb or apply pressure to the cast until your health care provider gives you permission.  °Keep the cast or splint dry. Wet casts or splints can lose their shape and may not support the limb as well. A wet cast that has lost its shape can also create harmful pressure on your skin when it dries. Also, wet skin can become infected.  °Cover the cast or splint with a plastic bag when bathing or when out in the rain or snow. If the cast is on the trunk of the body, take sponge baths until the cast is removed.  °If your cast does become wet, dry it with a towel or a blow dryer on the cool setting only. °Keep your cast or splint clean. Soiled casts may be wiped with a moistened cloth.  °Do not place any hard or soft foreign objects under your cast or splint, such as cotton, toilet paper, lotion, or powder.  °Do not try to scratch the skin under the cast with any object. The object could get stuck inside the cast. Also, scratching could lead to an infection. If itching is a problem, use a blow dryer on a cool setting to relieve discomfort.  °Do not trim or cut your cast or remove padding from inside of it.  °Exercise all joints next to the injury that are not immobilized by the cast or splint. For example, if you have a long leg cast, exercise the hip joint and toes. If you have an arm cast or splint, exercise the shoulder, elbow, thumb, and fingers.  °Elevate your injured arm or leg on 1 or 2 pillows for the first 1 to 3 days to decrease swelling and  pain. It is best if you can comfortably elevate your cast so it is higher than your heart. °SEEK MEDICAL CARE IF:  °Your cast or splint cracks.  °Your cast or splint is too tight or too loose.  °You have unbearable itching inside the cast.  °Your cast becomes wet or develops a soft spot or area.  °You have a bad smell coming from inside your cast.  °You get an object stuck under your cast.  °Your skin around the cast becomes red or raw.  °You have new pain or worsening pain after the cast has been applied. °SEEK IMMEDIATE MEDICAL CARE IF:  °You have fluid leaking through the cast.  °You are unable to move your fingers or toes.  °You have discolored (blue or white), cool, painful, or very swollen fingers or toes beyond the cast.  °You have tingling or numbness around the injured area.  °You have severe pain or pressure under the cast.  °You have any difficulty with your breathing or have shortness of breath.  °You have chest pain. °This information is not intended to replace advice given to you by your health care provider. Make sure you discuss any questions you have with your health care provider.  °  Document Released: 01/28/2000 Document Revised: 11/20/2012 Document Reviewed: 08/08/2012  °Elsevier Interactive Patient Education ©2016 Elsevier Inc.  ° °

## 2015-07-07 NOTE — Clinical Social Work Note (Signed)
Clinical Social Worker facilitated patient discharge including contacting patient family and facility to confirm patient discharge plans.  Clinical information faxed to facility and family agreeable with plan.  CSW arranged ambulance transport via [facility] to [facility].  RN to call report prior to discharge.  Clinical Social Worker will sign off for now as social work intervention is no longer needed. Please consult us again if new need arises.  Jesse Jahmari Esbenshade, LCSW [phone] 

## 2015-07-07 NOTE — Progress Notes (Signed)
Report given to [facility]. 

## 2015-07-19 ENCOUNTER — Ambulatory Visit: Payer: Medicare Other | Admitting: Cardiovascular Disease

## 2015-07-23 NOTE — Progress Notes (Signed)
   07/05/15 0930  OT G-codes **NOT FOR INPATIENT CLASS**  Functional Assessment Tool Used clinical observation  Functional Limitation Self care  Self Care Current Status [id number]) CM  Self Care Goal Status [id number]) CJ   Late entry for 07/05/2015 [place], OTR/L [id number] 07/23/2015

## 2015-07-27 NOTE — Progress Notes (Signed)
Late entry for Missing G-codes for evaluation on 07-15-2015.    07-16-2015 0930  PT G-Codes **NOT FOR INPATIENT CLASS**  Functional Assessment Tool Used clinical judgment  Functional Limitation Mobility: Walking and moving around  Mobility: Walking and Moving Around Current Status [id number]) CL  Mobility: Walking and Moving Around Goal Status [id number]) CK  Cassell Clement, PT, CSCS Pager [phone] Office [phone]

## 2015-08-30 ENCOUNTER — Other Ambulatory Visit: Payer: Self-pay | Admitting: Family Medicine

## 2015-08-30 ENCOUNTER — Other Ambulatory Visit: Payer: Self-pay | Admitting: General Practice

## 2015-08-30 DIAGNOSIS — E2839 Other primary ovarian failure: Secondary | ICD-10-CM

## 2015-09-10 ENCOUNTER — Other Ambulatory Visit: Payer: Medicare Other

## 2015-09-16 ENCOUNTER — Telehealth: Payer: Self-pay | Admitting: Cardiovascular Disease

## 2015-09-16 NOTE — Telephone Encounter (Signed)
Spoke with pt. She reports she is recovering from broken leg. Has swelling in ankle where break was.  She reports Dr. Arelia Sneddon has called in Naproxen.  She is asking if OK to take.  I told her it should be OK to take on a short term basis but we do not recommend that pt's take this long term.  She states she only plans to take for a short time.

## 2015-09-16 NOTE — Telephone Encounter (Signed)
New Message:    Please call,concerning the Naproxen she just started taking.

## 2015-09-17 ENCOUNTER — Other Ambulatory Visit: Payer: Medicare Other

## 2015-10-22 ENCOUNTER — Telehealth: Payer: Self-pay | Admitting: Cardiovascular Disease

## 2015-10-22 NOTE — Telephone Encounter (Signed)
I spoke with pt. She reports she took Naproxen for 8 days and then stopped about 3 weeks ago  Area near break has become painful and swollen again. She is asking if OK to take Naproxen again. I told her it should be OK to take for 2-3 days.

## 2015-10-22 NOTE — Telephone Encounter (Signed)
New message        Pt c/o medication issue:  1. Name of Medication: naproxin 2. How are you currently taking this medication (dosage and times per day)? 500mg  3. Are you having a reaction (difficulty breathing--STAT)? no 4. What is your medication issue? Pt want to know how long she can take this medication and be "safe"

## 2015-10-22 NOTE — Telephone Encounter (Signed)
See previous phone note --Naproxen was prescribed by ortho after pt had broken leg

## 2015-10-25 NOTE — Telephone Encounter (Signed)
Agree. Thanks. chris 

## 2015-11-13 ENCOUNTER — Emergency Department (HOSPITAL_COMMUNITY): Payer: Medicare Other

## 2015-11-13 ENCOUNTER — Encounter (HOSPITAL_COMMUNITY): Payer: Self-pay | Admitting: Emergency Medicine

## 2015-11-13 ENCOUNTER — Emergency Department (HOSPITAL_COMMUNITY)
Admission: EM | Admit: 2015-11-13 | Discharge: 2015-11-13 | Disposition: A | Discharge status: Home / Self Care | Payer: Medicare Other | Attending: Emergency Medicine | Admitting: Emergency Medicine

## 2015-11-13 DIAGNOSIS — Z955 Presence of coronary angioplasty implant and graft: Secondary | ICD-10-CM | POA: Insufficient documentation

## 2015-11-13 DIAGNOSIS — Z7982 Long term (current) use of aspirin: Secondary | ICD-10-CM | POA: Diagnosis not present

## 2015-11-13 DIAGNOSIS — I252 Old myocardial infarction: Secondary | ICD-10-CM | POA: Insufficient documentation

## 2015-11-13 DIAGNOSIS — I10 Essential (primary) hypertension: Secondary | ICD-10-CM | POA: Insufficient documentation

## 2015-11-13 DIAGNOSIS — I2510 Atherosclerotic heart disease of native coronary artery without angina pectoris: Secondary | ICD-10-CM | POA: Insufficient documentation

## 2015-11-13 DIAGNOSIS — Z85048 Personal history of other malignant neoplasm of rectum, rectosigmoid junction, and anus: Secondary | ICD-10-CM | POA: Diagnosis not present

## 2015-11-13 DIAGNOSIS — Z96641 Presence of right artificial hip joint: Secondary | ICD-10-CM | POA: Diagnosis not present

## 2015-11-13 DIAGNOSIS — I4892 Unspecified atrial flutter: Secondary | ICD-10-CM | POA: Diagnosis not present

## 2015-11-13 DIAGNOSIS — R079 Chest pain, unspecified: Secondary | ICD-10-CM

## 2015-11-13 LAB — CBC
HEMATOCRIT: 37.6 % (ref 36.0–46.0)
Hemoglobin: 11.9 g/dL — ABNORMAL LOW (ref 12.0–15.0)
MCH: 30 pg (ref 26.0–34.0)
MCHC: 31.6 g/dL (ref 30.0–36.0)
MCV: 94.7 fL (ref 78.0–100.0)
Platelets: 256 10*3/uL (ref 150–400)
RBC: 3.97 MIL/uL (ref 3.87–5.11)
RDW: 13.1 % (ref 11.5–15.5)
WBC: 7.6 10*3/uL (ref 4.0–10.5)

## 2015-11-13 LAB — BASIC METABOLIC PANEL
Anion gap: 8 (ref 5–15)
BUN: 17 mg/dL (ref 6–20)
CHLORIDE: 104 mmol/L (ref 101–111)
CO2: 25 mmol/L (ref 22–32)
CREATININE: 0.75 mg/dL (ref 0.44–1.00)
Calcium: 9.5 mg/dL (ref 8.9–10.3)
GFR calc Af Amer: >60 mL/min (ref >60)
GFR calc non Af Amer: >60 mL/min (ref >60)
Glucose, Bld: 98 mg/dL (ref 65–99)
POTASSIUM: 4 mmol/L (ref 3.5–5.1)
SODIUM: 137 mmol/L (ref 135–145)

## 2015-11-13 LAB — I-STAT TROPONIN, ED
Troponin i, poc: 0 ng/mL (ref 0.00–0.08)
Troponin i, poc: 0 ng/mL (ref 0.00–0.08)

## 2015-11-13 MED ORDER — ASPIRIN 81 MG PO CHEW
162.0000 mg | CHEWABLE_TABLET | Freq: Once | ORAL | Status: AC
  Administered 2015-11-13: 162 mg via ORAL
  Filled 2015-11-13: qty 2

## 2015-11-13 MED ORDER — METOPROLOL TARTRATE 5 MG/5ML IV SOLN
2.5000 mg | Freq: Once | INTRAVENOUS | Status: AC
  Administered 2015-11-13: 2.5 mg via INTRAVENOUS
  Filled 2015-11-13: qty 5

## 2015-11-13 NOTE — ED Triage Notes (Signed)
Pt coming from home. Per [facility] pt states her HR felt irregualrto her. Upon EMS arrival pt  HR was regular but pt was tachycardic. Pt took 2 ASA and 2 NTG. Pt had chest discomfort and has resolved before EMS arrival. Pt had palpitations at home.

## 2015-11-13 NOTE — Discharge Instructions (Signed)
Keep your appointment with Dr.McAlhany as scheduled.    Return to ER with recurrence of symptoms

## 2015-11-13 NOTE — ED Notes (Signed)
Walked patient to the bathroom  

## 2015-11-13 NOTE — ED Provider Notes (Signed)
Pt seen by Dr Jeneen Rinks.  Plan was for discharge as long as 2nd troponin normal.   2nd troponin 0.   Dorie Rank, MD 11/13/15 [phone]

## 2015-11-13 NOTE — ED Provider Notes (Addendum)
[facility] DEPT Provider Note   CSN: [id number] Arrival date & time: 11/13/15  0527     History   Chief Complaint Chief Complaint  Patient presents with  . Tachycardia    HPI [name] is a 82 y.o. female. She presents with a chief complaint of chest pain and "something just doesn't feel right". She awakened approximately 5 AM. Posterior hand across her left chest and states it "didn't feel right". She felt like it was "fast and irregular" when she tried to take her pulse. She has not taken her morning medications. Does have a history of PVCs, and PAF.  This is a healthy active thriving independent living 82 year old female.  She presents with a history of coronary artery disease, moderate AI, mild MR, PAF, in STEMI 7/12 with RCA bare-metal stent are 100% occlusion. Also had staged BMS to circumflex..  Rectal has had preserved ventricular function. On Holter monitoring had normal sinus rhythm with PACs runs of SVT and occasional PVCs. No A. fib, or VT. She is on metoprolol. History of rectal cancer status post resection   HPI  Past Medical History:  Diagnosis Date  . Arthritis    FINGERS   . CAD (coronary artery disease) CARDIOLOGIST-- DR Angelena Form   a. s/p INF STEMI 7/12: tx with BMS to RCA;  b. cath 08/26/10: pLAD 30%, mLAD 50%, D1 40%, pCFX 95%, mRCA occluded;   c. staged PCI of pCFX with BMS;   d. echo 7/12:   EF 60-65%, mild RAE, mild to moderate AI, mild MR, moderate TR, RVE, PASP 47  . Complication of anesthesia PT STATES "MADE HER FEEL CRAZY"  . Degeneration of eye    left eye cornea  . Dyspnea   . First degree heart block   . Heart palpitations PAC'S AND SVT RUN'S  PER CARDIOLOGIST NOTE  . History of ST elevation myocardial infarction (STEMI) 08-26-2010-- INFERIOR WALL   S/P PCI  BMS IN RCA AND PROX. CX  . Hyperlipidemia   . Hypertension   . Impaired hearing BILATERAL HEARING AIDS  . Osteopenia   . PAF (paroxysmal atrial fibrillation) ([place])   . PONV  (postoperative nausea and vomiting)   . Pulmonary nodules BENIGN  PER CT  10-12-2010  . Rectal Cancer 08/2010   adenocarcinoma   S/P PARTIAL PROCTECTOMY (NO CHEMO OR RADIATION)  . 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 S/P coronary artery stent placement 08/2010   X2  BM    Patient Active Problem List   Diagnosis Date Noted  . Fracture, tibia and fibula 07/06/2015  . Tibia/fibula fracture 07/03/2015  . Lung nodule seen on imaging study 09/05/2013  . Exposure to TB 09/05/2013  . Vascular skin changes 07/17/2011  . 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 Anorexia post-op 12/08/2010  . HTN (hypertension) 11/03/2010  . Dizziness 09/15/2010  . CAD (coronary artery disease)   . Hyperlipidemia   . Rectal cancer, pT2uN0(pNX) s/p TEM partial proctectomy 09/07/2010    Past Surgical History:  Procedure Laterality Date  . ABDOMINAL HYSTERECTOMY  1950's   AND APPENDECTOMY  . CATARACT EXTRACTION W/ INTRAOCULAR LENS  IMPLANT, BILATERAL    . CORONARY ANGIOPLASTY WITH STENT PLACEMENT  08-26-2010  DR [place]   PCI, BM STENT IN RCA  . CORONARY ANGIOPLASTY WITH STENT PLACEMENT  08-29-2010  DR [place]   PCI, BM STENT IN PROXIMAL CIRCUMFLEX  . EXCISION  BENIGN CYST RIGHT BREAST    . FISTULA PLUG N/A 06/21/2012   Procedure: insertion of FISTULA PLUG;  Surgeon: Leighton Ruff, MD;  Location: [facility];  Service: General;  Laterality: N/A;  . FLEXIBLE SIGMOIDOSCOPY N/A 04/02/2012   Procedure: FLEXIBLE SIGMOIDOSCOPY;  Surgeon: Leighton Ruff, MD;  Location: [facility];  Service: Endoscopy;  Laterality: N/A;  . KNEE SURGERY Right 1996  . LAPROSCOPY LYSIS ADHESIONS/ DRAINAGE OF PELVIC ABSCESS/ DIVERTING LOOP SIGMOID COLECTOMY  11-22-2010   POST OP RECTOVAGINAL FISTULA  . PARTIAL PROCTECTOMY BY TEM  11-17-2010   RECTAL CANCER  . RELEASE LEFT CARPAL TUNNEL/ OSTEOTOMY LEFT  DISTAL RADIUS  11-10-2009  . STAPEDECTOMY  1970'S  . TONSILLECTOMY  CHILD  . TOTAL HIP ARTHROPLASTY Right 1992  . TRANSTHORACIC ECHOCARDIOGRAM  10-10-2011   NORMAL LV SIZE WITH MILD FOCAL BASAL SEPTAL HYPERTROPHY/ EF 55-60%/ NORMAL RV SIZE AND LVSF/ BIATRIAL ENLARGEMENT/ MILD TO MODERATE AI  &  TR  . TYMPANOPLASTY Left 12-23-2009    OB History    No data available       Home Medications    Prior to Admission medications   Medication Sig Start Date End Date Taking? Authorizing Provider  acetaminophen (TYLENOL) 500 MG tablet Take 2 tablets (1,000 mg total) by mouth every 6 (six) hours as needed for mild pain. 07/06/15  Yes Christina P Rama, MD  ALPRAZolam (XANAX) 0.25 MG tablet Take 0.125 mg by mouth 2 (two) times daily.   Yes Historical Provider, MD  aspirin EC 81 MG tablet Take 81 mg by mouth daily.   Yes Historical Provider, MD  aspirin-acetaminophen-caffeine (EXCEDRIN MIGRAINE) [phone] MG per tablet Take 1 tablet by mouth every 6 (six) hours as needed for headache.   Yes Historical Provider, MD  beta carotene w/minerals (OCUVITE) tablet Take 1 tablet by mouth daily.   Yes Historical Provider, MD  BIOTIN PO Take 2 tablets by mouth daily.   Yes Historical Provider, MD  cholecalciferol (VITAMIN D) 1000 units tablet Take 1,000 Units by mouth daily.   Yes Historical Provider, MD  feeding supplement, ENSURE ENLIVE, (ENSURE ENLIVE) LIQD Take 237 mLs by mouth daily at 3 pm. 07/06/15  Yes Venetia Maxon Rama, MD  metoprolol tartrate (LOPRESSOR) 25 MG tablet Take 0.5 tablets (12.5 mg total) by mouth 2 (two) times daily. 11/03/14  Yes Merryl Hacker, MD  Multiple Vitamin (MULTIVITAMIN) tablet Take 1 tablet by mouth daily.    Yes Historical Provider, MD  neomycin-bacitracin-polymyxin (NEOSPORIN) OINT Apply 1 application topically as needed for wound care. 07/06/15  Yes Venetia Maxon Rama, MD  nitroGLYCERIN (NITROSTAT) 0.4 MG SL tablet Place 1 tablet (0.4 mg total) under the tongue every 5 (five)  minutes as needed for chest pain (MAX 3 TABLETS). 10/31/13  Yes Burnell Blanks, MD  omega-3 acid ethyl esters (LOVAZA) 1 g capsule Take 1 g by mouth daily.   Yes Historical Provider, MD  Polyethyl Glycol-Propyl Glycol (SYSTANE) 0.4-0.3 % GEL Apply 1 drop to eye at bedtime.    Yes Historical Provider, MD  sertraline (ZOLOFT) 25 MG tablet Take 25 mg by mouth daily.   Yes Historical Provider, MD  traMADol (ULTRAM) 50 MG tablet Take 1 tablet (50 mg total) by mouth every 6 (six) hours as needed for severe pain. 07/06/15   Venetia Maxon Rama, MD    Family History Family History  Problem Relation Age of Onset  . Kidney disease Mother   . Heart disease Father   . Cancer Brother  Social History Social History  Substance Use Topics  . Smoking status: Never Smoker  . Smokeless tobacco: Never Used  . Alcohol use No     Allergies   Effexor [venlafaxine hydrochloride]; Penicillins; Sulfa antibiotics; Dicyclomine; and Hydrocodone   Review of Systems Review of Systems  Constitutional: Negative for appetite change, chills, diaphoresis, fatigue and fever.  HENT: Negative for mouth sores, sore throat and trouble swallowing.   Eyes: Negative for visual disturbance.  Respiratory: Negative for cough, chest tightness, shortness of breath and wheezing.   Cardiovascular: Positive for chest pain and palpitations.  Gastrointestinal: Negative for abdominal distention, abdominal pain, diarrhea, nausea and vomiting.  Endocrine: Negative for polydipsia, polyphagia and polyuria.  Genitourinary: Negative for dysuria, frequency and hematuria.  Musculoskeletal: Negative for gait problem.  Skin: Negative for color change, pallor and rash.  Neurological: Negative for dizziness, syncope, light-headedness and headaches.  Hematological: Does not bruise/bleed easily.  Psychiatric/Behavioral: Negative for behavioral problems and confusion.     Physical Exam Updated Vital Signs BP 127/62   Pulse 75    Temp 97.6 F (36.4 C) (Oral)   Resp 14   Ht 5\' 4"  (1.626 m)   Wt 107 lb (48.5 kg)   SpO2 98%   BMI 18.37 kg/m   Physical Exam  Constitutional: She is oriented to person, place, and time. She appears well-developed and well-nourished. No distress.  HENT:  Head: Normocephalic.  Eyes: Conjunctivae are normal. Pupils are equal, round, and reactive to light. No scleral icterus.  Neck: Normal range of motion. Neck supple. No thyromegaly present.  Cardiovascular: Regular rhythm.  Tachycardia present.  Exam reveals no gallop and no friction rub.   No murmur heard. Pulmonary/Chest: Effort normal and breath sounds normal. No respiratory distress. She has no wheezes. She has no rales.  Abdominal: Soft. Bowel sounds are normal. She exhibits no distension. There is no tenderness. There is no rebound.  Musculoskeletal: Normal range of motion.  Neurological: She is alert and oriented to person, place, and time.  Skin: Skin is warm and dry. No rash noted.  Psychiatric: She has a normal mood and affect. Her behavior is normal.     ED Treatments / Results  Labs (all labs ordered are listed, but only abnormal results are displayed) Labs Reviewed  CBC - Abnormal; Notable for the following:       Result Value   Hemoglobin 11.9 (*)    All other components within normal limits  BASIC METABOLIC PANEL  I-STAT TROPOININ, ED    EKG  EKG Interpretation  Date/Time:  Saturday November 13 2015 05:41:28 EDT Ventricular Rate:  115 PR Interval:    QRS Duration: 131 QT Interval:  413 QTC Calculation: 572 R Axis:   -25 Text Interpretation:  Sinus tachycardia Left bundle branch block --New Confirmed by Jeneen Rinks  MD, [place] ([id number]) on 11/13/2015 5:48:21 AM       Radiology Dg Chest 2 View  Result Date: 11/13/2015 CLINICAL DATA:  Palpitations tonight. EXAM: CHEST  2 VIEW COMPARISON:  07/05/2015 FINDINGS: Emphysematous changes and scattered fibrosis in the lungs. Cardiac enlargement without vascular  congestion. Peribronchial thickening suggesting chronic bronchitis. Focal opacity in the right mid lung remains present since previous study but is more diffuse today suggesting dated likely represents scarring. Underlying nodule is not excluded and repeat chest CT is suggested if clinically indicated for further evaluation. Scattered calcified granulomas in the lungs. Calcification of the aorta. No blunting of costophrenic angles. No pneumothorax. Degenerative changes in the spine and  shoulders. IMPRESSION: Emphysematous changes and chronic bronchitic changes in the lungs. Focal opacity in the right mid lung is somewhat more diffuse than on previous study but due to persistence of this finding, CT is suggested if clinically indicated to exclude underlying nodule. No focal acute consolidation. Electronically Signed   By: Lucienne Capers M.D.   On: 11/13/2015 06:39    Procedures Procedures (including critical care time)  Medications Ordered in ED Medications  metoprolol (LOPRESSOR) injection 2.5 mg (2.5 mg Intravenous Given 11/13/15 0716)  aspirin chewable tablet 162 mg (162 mg Oral Given 11/13/15 0717)     Initial Impression / Assessment and Plan / ED Course  I have reviewed the triage vital signs and the nursing notes.  Pertinent labs & imaging results that were available during my care of the patient were reviewed by me and considered in my medical decision making (see chart for details).  Clinical Course    Prehospital EKGs show rate of 150. A wide-complex, probable bundle-branch block and 2-1 atrial flutter. She took a nitroglycerin. She took 2 aspirin prior to arrival of paramedics. She states her heart feels "better" but "still feels like something is not right".  Troponin is normal. EKG here shows left bundle but no obvious ischemia. This is new for her. No bundle present in most recent EKG May of this year. First troponin normal. She is normally on metoprolol 12.5 twice a day. He was  given metoprolol 2.5 IV. Heart rate improves into the low 80s and her symptoms resolved.  Currently symptomatic and in sinus rhythm with no bundle. Will need admission for rule out. We'll discuss with her cardiologist.  08:55:  S with Dr.Nashar.  We're in agreement that if the patient's delta troponin at 3 hours was negative that she could be safely discharged home she is symptom-free. Delta troponin at 09 10.  Final Clinical Impressions(s) / ED Diagnoses   Final diagnoses:  Atrial flutter, unspecified type ([place])  Chest pain, unspecified chest pain type    New Prescriptions New Prescriptions   No medications on file     Tanna Furry, MD 11/13/15 [id number]    Tanna Furry, MD 11/13/15 [phone]

## 2015-11-16 ENCOUNTER — Encounter: Payer: Self-pay | Admitting: Cardiovascular Disease

## 2015-11-16 NOTE — Progress Notes (Signed)
Chief Complaint  Patient presents with  . Coronary Artery Disease     History of Present Illness: 82 yo WF with a history of CAD, moderate AI, mild MR, rectal cancer, atrial flutter here today for cardiac follow up. She was admitted 08/2010 with an inferior STEMI. Emergent cath done with placement of a BMS to the RCA for 100% occlusion. She was noted to have a high grade proximal CFX 95%. She was brought back to the cath lab a couple days later for staged PCI. She was treated with BMS to the CFX. Echo 08/27/11: EF 60-65%, mild RAE, mild to moderate AI, mild MR, moderate TR, RVE, PASP 47. She has undergone partial proctectomy with loop colostomy in 11/2010. She developed an abscess with fistula. She had issues with persistent rectovaginal fistula. I saw her 10/02/11 and she had c/o fatigue, dyspnea and palpitations. I arranged an echo which showed normal LV size and function with mild to moderate AI, moderate TR. 48 hour holter monitor showed NSR with PACs and runs of SVT with rare PVCs. No VT or atrial fibrillation noted. I saw her in January 2015 and she was still having palpitations. Metoprolol lowered to 12.5 mg po BID in primary care due to hypotension and then stopped recently. Echo 04/23/14 with normal LV systolic function, moderate AI, mild MR, RVSP 38 mm Hg, moderate TR. She presented to the ED 11/02/14 with c/o dyspnea and palpitations. No chest pain or SOB. EKG showed sinus rhythm with PACs. She was started back on her metoprolol in the ED. She fell in May 2017 and fractured her right ankle. ED visit 11/13/15 with palpitations and found to be in atrial flutter. Given IV metoprolol and converted to sinus. No anti-coagulation given age and fall risk.    She is here today for follow up.  She denies chest pain or dyspnea. She has had no further palpitations.   Primary Care Physician: Leonard Downing, MD  Past Medical History:  Diagnosis Date  . Arthritis    FINGERS   . CAD (coronary artery  disease) CARDIOLOGIST-- DR Angelena Form   a. s/p INF STEMI 7/12: tx with BMS to RCA;  b. cath 08/26/10: pLAD 30%, mLAD 50%, D1 40%, pCFX 95%, mRCA occluded;   c. staged PCI of pCFX with BMS;   d. echo 7/12:   EF 60-65%, mild RAE, mild to moderate AI, mild MR, moderate TR, RVE, PASP 47  . Complication of anesthesia PT STATES "MADE HER FEEL CRAZY"  . Degeneration of eye    left eye cornea  . Dyspnea   . First degree heart block   . Heart palpitations PAC'S AND SVT RUN'S  PER CARDIOLOGIST NOTE  . History of ST elevation myocardial infarction (STEMI) 08-26-2010-- INFERIOR WALL   S/P PCI  BMS IN RCA AND PROX. CX  . Hyperlipidemia   . Hypertension   . Impaired hearing BILATERAL HEARING AIDS  . Osteopenia   . PAF (paroxysmal atrial fibrillation) ([place])   . PONV (postoperative nausea and vomiting)   . Pulmonary nodules BENIGN  PER CT  10-12-2010  . Rectal Cancer 08/2010   adenocarcinoma   S/P PARTIAL PROCTECTOMY (NO CHEMO OR RADIATION)  . 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 S/P coronary artery stent placement 08/2010   X2  BM    Past Surgical History:  Procedure Laterality Date  . ABDOMINAL HYSTERECTOMY  1950's   AND APPENDECTOMY  .  CATARACT EXTRACTION W/ INTRAOCULAR LENS  IMPLANT, BILATERAL    . CORONARY ANGIOPLASTY WITH STENT PLACEMENT  08-26-2010  DR [place]   PCI, BM STENT IN RCA  . CORONARY ANGIOPLASTY WITH STENT PLACEMENT  08-29-2010  DR [place]   PCI, BM STENT IN PROXIMAL CIRCUMFLEX  . EXCISION BENIGN CYST RIGHT BREAST    . FISTULA PLUG N/A 06/21/2012   Procedure: insertion of FISTULA PLUG;  Surgeon: Leighton Ruff, MD;  Location: [facility];  Service: General;  Laterality: N/A;  . FLEXIBLE SIGMOIDOSCOPY N/A 04/02/2012   Procedure: FLEXIBLE SIGMOIDOSCOPY;  Surgeon: Leighton Ruff, MD;  Location: [facility];  Service: Endoscopy;  Laterality: N/A;  . KNEE SURGERY Right 1996  .  LAPROSCOPY LYSIS ADHESIONS/ DRAINAGE OF PELVIC ABSCESS/ DIVERTING LOOP SIGMOID COLECTOMY  11-22-2010   POST OP RECTOVAGINAL FISTULA  . PARTIAL PROCTECTOMY BY TEM  11-17-2010   RECTAL CANCER  . RELEASE LEFT CARPAL TUNNEL/ OSTEOTOMY LEFT DISTAL RADIUS  11-10-2009  . STAPEDECTOMY  1970'S  . TONSILLECTOMY  CHILD  . TOTAL HIP ARTHROPLASTY Right 1992  . TRANSTHORACIC ECHOCARDIOGRAM  10-10-2011   NORMAL LV SIZE WITH MILD FOCAL BASAL SEPTAL HYPERTROPHY/ EF 55-60%/ NORMAL RV SIZE AND LVSF/ BIATRIAL ENLARGEMENT/ MILD TO MODERATE AI  &  TR  . TYMPANOPLASTY Left 12-23-2009    Current Outpatient Prescriptions  Medication Sig Dispense Refill  . acetaminophen (TYLENOL) 500 MG tablet Take 2 tablets (1,000 mg total) by mouth every 6 (six) hours as needed for mild pain. 30 tablet 0  . ALPRAZolam (XANAX) 0.25 MG tablet Take 0.125 mg by mouth 2 (two) times daily.    Marland Kitchen aspirin EC 81 MG tablet Take 81 mg by mouth daily.    Marland Kitchen aspirin-acetaminophen-caffeine (EXCEDRIN MIGRAINE) 250-250-65 MG per tablet Take 1 tablet by mouth every 6 (six) hours as needed for headache.    . beta carotene w/minerals (OCUVITE) tablet Take 1 tablet by mouth daily.    Marland Kitchen BIOTIN PO Take 2 tablets by mouth daily.    . cholecalciferol (VITAMIN D) 1000 units tablet Take 1,000 Units by mouth daily.    . feeding supplement, ENSURE ENLIVE, (ENSURE ENLIVE) LIQD Take 237 mLs by mouth daily at 3 pm. 237 mL 12  . metoprolol tartrate (LOPRESSOR) 25 MG tablet Take 1 tablet (25 mg total) by mouth 2 (two) times daily. 60 tablet 11  . Multiple Vitamin (MULTIVITAMIN) tablet Take 1 tablet by mouth daily.     Marland Kitchen neomycin-bacitracin-polymyxin (NEOSPORIN) OINT Apply 1 application topically as needed for wound care.    . nitroGLYCERIN (NITROSTAT) 0.4 MG SL tablet Place 1 tablet (0.4 mg total) under the tongue every 5 (five) minutes as needed for chest pain (MAX 3 TABLETS). 25 tablet 6  . Polyethyl Glycol-Propyl Glycol (SYSTANE) 0.4-0.3 % GEL Apply 1 drop to  eye at bedtime.     . sertraline (ZOLOFT) 25 MG tablet Take 25 mg by mouth daily.    . traMADol (ULTRAM) 50 MG tablet Take 1 tablet (50 mg total) by mouth every 6 (six) hours as needed for severe pain. 30 tablet 0   No current facility-administered medications for this visit.     Allergies  Allergen Reactions  . Effexor [Venlafaxine Hydrochloride] Other (See Comments)    UNKNOWN  . Penicillins Shortness Of Breath    Has patient had a PCN reaction causing immediate rash, facial/tongue/throat swelling, SOB or lightheadedness with hypotension: Unknown Has patient had a PCN reaction causing severe rash involving mucus membranes or skin necrosis: No  Has patient had a PCN reaction that required hospitalization No Has patient had a PCN reaction occurring within the last 10 years: No If all of the above answers are "NO", then may proceed with Cephalosporin use.   . Sulfa Antibiotics Nausea Only  . Dicyclomine Other (See Comments)    Caused confusion  . Hydrocodone Nausea Only    Social History   Social History  . Marital status: Widowed    Spouse name: N/A  . Number of children: N/A  . Years of education: N/A   Occupational History  . Not on file.   Social History Main Topics  . Smoking status: Never Smoker  . Smokeless tobacco: Never Used  . Alcohol use No  . Drug use: No  . Sexual activity: Not on file   Other Topics Concern  . Not on file   Social History Narrative  . No narrative on file    Family History  Problem Relation Age of Onset  . Kidney disease Mother   . Heart disease Father   . Cancer Brother     Review of Systems:  As stated in the HPI and otherwise negative.   BP 132/60   Pulse 65   Ht 5\' 3"  (1.6 m)   Wt 109 lb (49.4 kg)   BMI 19.31 kg/m   Physical Examination: General: Well developed, well nourished, NAD  HEENT: OP clear, mucus membranes moist  SKIN: warm, dry. No rashes. Neuro: No focal deficits  Musculoskeletal: Muscle strength 5/5 all  ext  Psychiatric: Mood and affect normal  Neck: No JVD, no carotid bruits, no thyromegaly, no lymphadenopathy.  Lungs:Clear bilaterally, no wheezes, rhonci, crackles Cardiovascular: Regular rate and rhythm. No murmurs, gallops or rubs. Abdomen:Soft. Bowel sounds present. Non-tender.  Extremities: No lower extremity edema. Pulses are 2 + in the bilateral DP/PT.  Echo 04/23/14: Left ventricle: The cavity size was normal. There was focal basal hypertrophy. Systolic function was normal. The estimated ejection fraction was in the range of 55% to 60%. Wall motion was normal; there were no regional wall motion abnormalities. Doppler parameters are consistent with abnormal left ventricular relaxation (grade 1 diastolic dysfunction). - Aortic valve: Mildly calcified annulus. Mildly thickened leaflets. There was moderate regurgitation. - Mitral valve: Moderately calcified annulus. There was mild regurgitation. Mean gradient (D): 5 mm Hg. Peak gradient (D): 10 mm Hg. - Left atrium: The atrium was moderately dilated. - Tricuspid valve: There was moderate regurgitation. - Pulmonary arteries: Systolic pressure was mildly increased. PA peak pressure: 38 mm Hg (S).  EKG:  EKG is not  ordered today. The ekg ordered today demonstrates   Recent Labs: 11/13/2015: BUN 17; Creatinine, Ser 0.75; Hemoglobin 11.9; Platelets 256; Potassium 4.0; Sodium 137   Lipid Panel    Component Value Date/Time   CHOL 129 07/31/2011 1010   TRIG 88.0 07/31/2011 1010   HDL 74.20 07/31/2011 1010   CHOLHDL 2 07/31/2011 1010   VLDL 17.6 07/31/2011 1010   LDLCALC 37 07/31/2011 1010     Wt Readings from Last 3 Encounters:  11/17/15 109 lb (49.4 kg)  11/13/15 107 lb (48.5 kg)  05/19/15 107 lb 1.9 oz (48.6 kg)     Other studies Reviewed: Additional studies/ records that were reviewed today include: . Review of the above records demonstrates:    Assessment and Plan:   1. CAD: Stable. No chest  pains. Lipids were at goal in 2013 and given age we are not checking anymore. She is not on a statin  any longer given age. LV function normal by echo March 2016.    2. Atrial flutter: Will increase Lopressor to 25 mg BID. She is known to also have SVT and PACs. Will not start anti-coagulation given age and fall risk.    3. HTN: BP is controlled. She did not tolerate Lisinopril due to itching.    4. Aortic insufficiency: Moderate by echo 2016.  Will not repeat given age.   Current medicines are reviewed at length with the patient today.  The patient does not have concerns regarding medicines.  The following changes have been made:  no change  Labs/ tests ordered today include:  No orders of the defined types were placed in this encounter.   Disposition:   FU with me in 6 months  Signed, Lauree Chandler, MD 11/17/2015 1:32 PM    [facility] [address], [place], [place]  [address] Phone: [phone]; Fax: [phone]

## 2015-11-17 ENCOUNTER — Ambulatory Visit (INDEPENDENT_AMBULATORY_CARE_PROVIDER_SITE_OTHER): Payer: Medicare Other | Admitting: Cardiovascular Disease

## 2015-11-17 ENCOUNTER — Encounter: Payer: Self-pay | Admitting: Cardiovascular Disease

## 2015-11-17 ENCOUNTER — Encounter (INDEPENDENT_AMBULATORY_CARE_PROVIDER_SITE_OTHER): Payer: Self-pay

## 2015-11-17 VITALS — BP 132/60 | HR 65 | Ht 63.0 in | Wt 109.0 lb

## 2015-11-17 DIAGNOSIS — I10 Essential (primary) hypertension: Secondary | ICD-10-CM

## 2015-11-17 DIAGNOSIS — I2510 Atherosclerotic heart disease of native coronary artery without angina pectoris: Secondary | ICD-10-CM | POA: Diagnosis not present

## 2015-11-17 DIAGNOSIS — I351 Nonrheumatic aortic (valve) insufficiency: Secondary | ICD-10-CM | POA: Diagnosis not present

## 2015-11-17 DIAGNOSIS — I483 Typical atrial flutter: Secondary | ICD-10-CM | POA: Diagnosis not present

## 2015-11-17 MED ORDER — METOPROLOL TARTRATE 25 MG PO TABS: 25.0000 mg | ORAL_TABLET | Freq: Two times a day (BID) | ORAL | 11 refills | Status: DC

## 2015-11-17 NOTE — Patient Instructions (Signed)
Medication Instructions:  Your physician has recommended you make the following change in your medication: Increase metoprolol tartrate to 25 mg by mouth twice daily.    Labwork: none  Testing/Procedures: none  Follow-Up: Your physician wants you to follow-up in: 3-4 months.  You will receive a reminder letter in the mail two months in advance. If you don't receive a letter, please call our office to schedule the follow-up appointment.   Any Other Special Instructions Will Be Listed Below (If Applicable).     If you need a refill on your cardiac medications before your next appointment, please call your pharmacy.

## 2015-11-26 ENCOUNTER — Telehealth: Payer: Self-pay | Admitting: Cardiovascular Disease

## 2015-11-26 NOTE — Telephone Encounter (Signed)
New message      Talk to the nurse.  She would not tell me what she wanted

## 2015-11-26 NOTE — Telephone Encounter (Signed)
Spoke with pt who states at last office visit she was told to check BP and pulse and call with readings.  10/7-131/54,64 10/8-132/89,64 10/9-132/56,67 10/10-140/61,69 10/11-132/66,63 10/12-148/65,66 10/13-138/67,66  She states since metoprolol was increased last week she has felt more tired and weak in the afternoon.

## 2015-11-29 MED ORDER — METOPROLOL TARTRATE 25 MG PO TABS: 12.5000 mg | ORAL_TABLET | Freq: Two times a day (BID) | ORAL | 11 refills | Status: DC

## 2015-11-29 NOTE — Telephone Encounter (Signed)
I spoke with pt and gave her information from Dr. McAlhany 

## 2015-11-29 NOTE — Telephone Encounter (Signed)
Her BP and HR are ok on higher dose metoprolol but if she is feeling more tired on the higher dose, would have her cut back to metoprolol 12.5 mg po BID.   Gerald Stabs

## 2016-01-17 ENCOUNTER — Ambulatory Visit (INDEPENDENT_AMBULATORY_CARE_PROVIDER_SITE_OTHER): Payer: Medicare Other | Admitting: Cardiovascular Disease

## 2016-01-17 ENCOUNTER — Ambulatory Visit: Payer: Medicare Other | Admitting: Cardiovascular Disease

## 2016-01-17 VITALS — BP 118/58 | HR 76 | Resp 12 | Wt 108.0 lb

## 2016-01-17 DIAGNOSIS — I351 Nonrheumatic aortic (valve) insufficiency: Secondary | ICD-10-CM

## 2016-01-17 DIAGNOSIS — I10 Essential (primary) hypertension: Secondary | ICD-10-CM | POA: Diagnosis not present

## 2016-01-17 DIAGNOSIS — I2510 Atherosclerotic heart disease of native coronary artery without angina pectoris: Secondary | ICD-10-CM

## 2016-01-17 DIAGNOSIS — I483 Typical atrial flutter: Secondary | ICD-10-CM | POA: Diagnosis not present

## 2016-01-17 NOTE — Progress Notes (Signed)
Chief Complaint  Patient presents with  . Follow-up     History of Present Illness: 82 yo WF with a history of CAD, moderate AI, mild MR, rectal cancer, atrial flutter here today for cardiac follow up. She was admitted 08/2010 with an inferior STEMI. Emergent cath done with placement of a BMS to the RCA for 100% occlusion. She was noted to have a high grade proximal CFX 95%. She was brought back to the cath lab a couple days later for staged PCI. She was treated with BMS to the CFX. Echo 08/27/11: EF 60-65%, mild RAE, mild to moderate AI, mild MR, moderate TR, RVE, PASP 47. She has undergone partial proctectomy with loop colostomy in 11/2010. She developed an abscess with fistula. She had issues with persistent rectovaginal fistula. I saw her 10/02/11 and she had c/o fatigue, dyspnea and palpitations. I arranged an echo which showed normal LV size and function with mild to moderate AI, moderate TR. 48 hour holter monitor showed NSR with PACs and runs of SVT with rare PVCs. No VT or atrial fibrillation noted. I saw her in January 2015 and she was still having palpitations. Metoprolol lowered to 12.5 mg po BID in primary care due to hypotension and then stopped recently. Echo 04/23/14 with normal LV systolic function, moderate AI, mild MR, RVSP 38 mm Hg, moderate TR. She presented to the ED 11/02/14 with c/o dyspnea and palpitations. No chest pain or SOB. EKG showed sinus rhythm with PACs. She was started back on her metoprolol in the ED. She fell in May 2017 and fractured her right ankle. ED visit 11/13/15 with palpitations and found to be in atrial flutter. Given IV metoprolol and converted to sinus. No anti-coagulation given age and fall risk.    She is here today for follow up.  She denies chest pain or dyspnea. She has had no further palpitations. NO LE edema.    Primary Care Physician: Hayden Rasmussen., MD  Past Medical History:  Diagnosis Date  . Arthritis    FINGERS   . CAD (coronary artery  disease) CARDIOLOGIST-- DR Angelena Form   a. s/p INF STEMI 7/12: tx with BMS to RCA;  b. cath 08/26/10: pLAD 30%, mLAD 50%, D1 40%, pCFX 95%, mRCA occluded;   c. staged PCI of pCFX with BMS;   d. echo 7/12:   EF 60-65%, mild RAE, mild to moderate AI, mild MR, moderate TR, RVE, PASP 47  . Complication of anesthesia PT STATES "MADE HER FEEL CRAZY"  . Degeneration of eye    left eye cornea  . Dyspnea   . First degree heart block   . Heart palpitations PAC'S AND SVT RUN'S  PER CARDIOLOGIST NOTE  . History of ST elevation myocardial infarction (STEMI) 08-26-2010-- INFERIOR WALL   S/P PCI  BMS IN RCA AND PROX. CX  . Hyperlipidemia   . Hypertension   . Impaired hearing BILATERAL HEARING AIDS  . Osteopenia   . PAF (paroxysmal atrial fibrillation) ([place])   . PONV (postoperative nausea and vomiting)   . Pulmonary nodules BENIGN  PER CT  10-12-2010  . Rectal Cancer 08/2010   adenocarcinoma   S/P PARTIAL PROCTECTOMY (NO CHEMO OR RADIATION)  . 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 S/P coronary artery stent placement 08/2010   X2  BM    Past Surgical History:  Procedure Laterality Date  . ABDOMINAL HYSTERECTOMY  1950's   AND  APPENDECTOMY  . CATARACT EXTRACTION W/ INTRAOCULAR LENS  IMPLANT, BILATERAL    . CORONARY ANGIOPLASTY WITH STENT PLACEMENT  08-26-2010  DR [place]   PCI, BM STENT IN RCA  . CORONARY ANGIOPLASTY WITH STENT PLACEMENT  08-29-2010  DR [place]   PCI, BM STENT IN PROXIMAL CIRCUMFLEX  . EXCISION BENIGN CYST RIGHT BREAST    . FISTULA PLUG N/A 06/21/2012   Procedure: insertion of FISTULA PLUG;  Surgeon: Leighton Ruff, MD;  Location: [facility];  Service: General;  Laterality: N/A;  . FLEXIBLE SIGMOIDOSCOPY N/A 04/02/2012   Procedure: FLEXIBLE SIGMOIDOSCOPY;  Surgeon: Leighton Ruff, MD;  Location: [facility];  Service: Endoscopy;  Laterality: N/A;  . KNEE SURGERY Right 1996  .  LAPROSCOPY LYSIS ADHESIONS/ DRAINAGE OF PELVIC ABSCESS/ DIVERTING LOOP SIGMOID COLECTOMY  11-22-2010   POST OP RECTOVAGINAL FISTULA  . PARTIAL PROCTECTOMY BY TEM  11-17-2010   RECTAL CANCER  . RELEASE LEFT CARPAL TUNNEL/ OSTEOTOMY LEFT DISTAL RADIUS  11-10-2009  . STAPEDECTOMY  1970'S  . TONSILLECTOMY  CHILD  . TOTAL HIP ARTHROPLASTY Right 1992  . TRANSTHORACIC ECHOCARDIOGRAM  10-10-2011   NORMAL LV SIZE WITH MILD FOCAL BASAL SEPTAL HYPERTROPHY/ EF 55-60%/ NORMAL RV SIZE AND LVSF/ BIATRIAL ENLARGEMENT/ MILD TO MODERATE AI  &  TR  . TYMPANOPLASTY Left 12-23-2009    Current Outpatient Prescriptions  Medication Sig Dispense Refill  . acetaminophen (TYLENOL) 500 MG tablet Take 2 tablets (1,000 mg total) by mouth every 6 (six) hours as needed for mild pain. 30 tablet 0  . ALPRAZolam (XANAX) 0.25 MG tablet Take 0.125 mg by mouth 2 (two) times daily.    Marland Kitchen aspirin EC 81 MG tablet Take 81 mg by mouth daily.    Marland Kitchen aspirin-acetaminophen-caffeine (EXCEDRIN MIGRAINE) 250-250-65 MG per tablet Take 1 tablet by mouth every 6 (six) hours as needed for headache.    . beta carotene w/minerals (OCUVITE) tablet Take 1 tablet by mouth daily.    Marland Kitchen BIOTIN PO Take 2 tablets by mouth daily.    . cholecalciferol (VITAMIN D) 1000 units tablet Take 1,000 Units by mouth daily.    . feeding supplement, ENSURE ENLIVE, (ENSURE ENLIVE) LIQD Take 237 mLs by mouth daily at 3 pm. 237 mL 12  . metoprolol tartrate (LOPRESSOR) 25 MG tablet Take 0.5 tablets (12.5 mg total) by mouth 2 (two) times daily. 30 tablet 11  . Multiple Vitamin (MULTIVITAMIN) tablet Take 1 tablet by mouth daily.     Marland Kitchen neomycin-bacitracin-polymyxin (NEOSPORIN) OINT Apply 1 application topically as needed for wound care.    . nitroGLYCERIN (NITROSTAT) 0.4 MG SL tablet Place 1 tablet (0.4 mg total) under the tongue every 5 (five) minutes as needed for chest pain (MAX 3 TABLETS). 25 tablet 6  . Polyethyl Glycol-Propyl Glycol (SYSTANE) 0.4-0.3 % GEL Apply 1 drop  to eye at bedtime.     . sertraline (ZOLOFT) 25 MG tablet Take 25 mg by mouth daily.    . traMADol (ULTRAM) 50 MG tablet Take 1 tablet (50 mg total) by mouth every 6 (six) hours as needed for severe pain. 30 tablet 0   No current facility-administered medications for this visit.     Allergies  Allergen Reactions  . Effexor [Venlafaxine Hydrochloride] Other (See Comments)    UNKNOWN  . Penicillins Shortness Of Breath    Has patient had a PCN reaction causing immediate rash, facial/tongue/throat swelling, SOB or lightheadedness with hypotension: Unknown Has patient had a PCN reaction causing severe rash involving mucus membranes or  skin necrosis: No Has patient had a PCN reaction that required hospitalization No Has patient had a PCN reaction occurring within the last 10 years: No If all of the above answers are "NO", then may proceed with Cephalosporin use.   . Sulfa Antibiotics Nausea Only  . Dicyclomine Other (See Comments)    Caused confusion  . Hydrocodone Nausea Only    Social History   Social History  . Marital status: Widowed    Spouse name: N/A  . Number of children: N/A  . Years of education: N/A   Occupational History  . Not on file.   Social History Main Topics  . Smoking status: Never Smoker  . Smokeless tobacco: Never Used  . Alcohol use No  . Drug use: No  . Sexual activity: Not on file   Other Topics Concern  . Not on file   Social History Narrative  . No narrative on file    Family History  Problem Relation Age of Onset  . Kidney disease Mother   . Heart disease Father   . Cancer Brother     Review of Systems:  As stated in the HPI and otherwise negative.   BP (!) 118/58   Pulse 76   Resp 12   Wt 108 lb (49 kg)   BMI 19.13 kg/m   Physical Examination: General: Well developed, well nourished, NAD  HEENT: OP clear, mucus membranes moist  SKIN: warm, dry. No rashes. Neuro: No focal deficits  Musculoskeletal: Muscle strength 5/5 all ext   Psychiatric: Mood and affect normal  Neck: No JVD, no carotid bruits, no thyromegaly, no lymphadenopathy.  Lungs:Clear bilaterally, no wheezes, rhonci, crackles Cardiovascular: Regular rate and rhythm. No murmurs, gallops or rubs. Abdomen:Soft. Bowel sounds present. Non-tender.  Extremities: No lower extremity edema. Pulses are 2 + in the bilateral DP/PT.  Echo 04/23/14: Left ventricle: The cavity size was normal. There was focal basal hypertrophy. Systolic function was normal. The estimated ejection fraction was in the range of 55% to 60%. Wall motion was normal; there were no regional wall motion abnormalities. Doppler parameters are consistent with abnormal left ventricular relaxation (grade 1 diastolic dysfunction). - Aortic valve: Mildly calcified annulus. Mildly thickened leaflets. There was moderate regurgitation. - Mitral valve: Moderately calcified annulus. There was mild regurgitation. Mean gradient (D): 5 mm Hg. Peak gradient (D): 10 mm Hg. - Left atrium: The atrium was moderately dilated. - Tricuspid valve: There was moderate regurgitation. - Pulmonary arteries: Systolic pressure was mildly increased. PA peak pressure: 38 mm Hg (S).  EKG:  EKG is not  ordered today. The ekg ordered today demonstrates   Recent Labs: 11/13/2015: BUN 17; Creatinine, Ser 0.75; Hemoglobin 11.9; Platelets 256; Potassium 4.0; Sodium 137   Lipid Panel    Component Value Date/Time   CHOL 129 07/31/2011 1010   TRIG 88.0 07/31/2011 1010   HDL 74.20 07/31/2011 1010   CHOLHDL 2 07/31/2011 1010   VLDL 17.6 07/31/2011 1010   LDLCALC 37 07/31/2011 1010     Wt Readings from Last 3 Encounters:  01/17/16 108 lb (49 kg)  11/17/15 109 lb (49.4 kg)  11/13/15 107 lb (48.5 kg)     Other studies Reviewed: Additional studies/ records that were reviewed today include: . Review of the above records demonstrates:    Assessment and Plan:   1. CAD without angina: She has no chest  pain to suggest angina. Lipids were at goal in 2013 and given age we are not checking anymore. She  is not on a statin any longer given age. LV function normal by echo March 2016.  Continue ASA and beta blockers.   2. Atrial flutter: She appears to be in sinus today. Will continue Lopressor 12.5 mg BID. She is known to also have SVT and PACs. Will not start anti-coagulation given age and fall risk.    3. HTN: BP is controlled. She did not tolerate Lisinopril due to itching.    4. Aortic insufficiency: Moderate by echo 2016.  Will not repeat given age.   Current medicines are reviewed at length with the patient today.  The patient does not have concerns regarding medicines.  The following changes have been made:  no change  Labs/ tests ordered today include:  No orders of the defined types were placed in this encounter.   Disposition:   FU with me in 6 months  Signed, Lauree Chandler, MD 01/17/2016 10:25 AM    [address], [place], [place]  [address] Phone: [phone]; Fax: [phone]

## 2016-01-17 NOTE — Patient Instructions (Signed)
Medication Instructions:  Your physician recommends that you continue on your current medications as directed. Please refer to the Current Medication list given to you today.   Labwork: none  Testing/Procedures: none  Follow-Up: Your physician recommends that you schedule a follow-up appointment in: 6 months.  Please call us in about 3 months to schedule this  I have cancelled the January appointment.     Any Other Special Instructions Will Be Listed Below (If Applicable).     If you need a refill on your cardiac medications before your next appointment, please call your pharmacy.

## 2016-03-01 ENCOUNTER — Ambulatory Visit: Payer: Medicare Other | Admitting: Cardiovascular Disease

## 2016-03-08 ENCOUNTER — Other Ambulatory Visit: Payer: Self-pay | Admitting: Cardiovascular Disease

## 2016-03-09 ENCOUNTER — Other Ambulatory Visit: Payer: Self-pay | Admitting: Cardiovascular Disease

## 2016-07-06 ENCOUNTER — Ambulatory Visit: Payer: Medicare Other | Admitting: Cardiovascular Disease

## 2016-07-19 ENCOUNTER — Encounter: Payer: Self-pay | Admitting: Cardiovascular Disease

## 2016-07-19 ENCOUNTER — Ambulatory Visit (INDEPENDENT_AMBULATORY_CARE_PROVIDER_SITE_OTHER): Payer: Medicare Other | Admitting: Cardiovascular Disease

## 2016-07-19 VITALS — BP 146/50 | HR 63 | Ht 63.0 in | Wt 108.8 lb

## 2016-07-19 DIAGNOSIS — I2510 Atherosclerotic heart disease of native coronary artery without angina pectoris: Secondary | ICD-10-CM

## 2016-07-19 DIAGNOSIS — I10 Essential (primary) hypertension: Secondary | ICD-10-CM

## 2016-07-19 DIAGNOSIS — I351 Nonrheumatic aortic (valve) insufficiency: Secondary | ICD-10-CM | POA: Diagnosis not present

## 2016-07-19 DIAGNOSIS — I483 Typical atrial flutter: Secondary | ICD-10-CM

## 2016-07-19 NOTE — Progress Notes (Signed)
Chief Complaint  Patient presents with  . Follow-up    CAD     History of Present Illness: 83 yo female with history of CAD, aortic valve insufficiency, mitral regurgitation, rectal cancer and atrial flutter who is here today for cardiac follow up. She was admitted 08/2010 with an inferior STEMI and was found to have a totally occluded RCA which was treated with a bare metal stent. Staged PCI of a severe stenosis in the proximal Circumflex treated with a bare metal stent. Echo March 2016 with normal LV function, moderate AI, mild MR. She is known to have SVT, PVCs and PACs documented by cardiac monitor in 2013. She was found to have atrial flutter in September 2017. Anti-coagulation was not started given advanced age and fall risk. She has been on ASA and a beta blocker. She has a history of rectal cancer and has a rectovaginal fistula.   She is here today for follow up. The patient denies any chest pain, dyspnea, palpitations, lower extremity edema, orthopnea, PND, dizziness, near syncope or syncope.    Primary Care Physician: Hayden Rasmussen, MD  Past Medical History:  Diagnosis Date  . Arthritis    FINGERS   . CAD (coronary artery disease) CARDIOLOGIST-- DR Angelena Form   a. s/p INF STEMI 7/12: tx with BMS to RCA;  b. cath 08/26/10: pLAD 30%, mLAD 50%, D1 40%, pCFX 95%, mRCA occluded;   c. staged PCI of pCFX with BMS;   d. echo 7/12:   EF 60-65%, mild RAE, mild to moderate AI, mild MR, moderate TR, RVE, PASP 47  . Complication of anesthesia PT STATES "MADE HER FEEL CRAZY"  . Degeneration of eye    left eye cornea  . Dyspnea   . First degree heart block   . Heart palpitations PAC'S AND SVT RUN'S  PER CARDIOLOGIST NOTE  . History of ST elevation myocardial infarction (STEMI) 08-26-2010-- INFERIOR WALL   S/P PCI  BMS IN RCA AND PROX. CX  . Hyperlipidemia   . Hypertension   . Impaired hearing BILATERAL HEARING AIDS  . Osteopenia   . PAF (paroxysmal atrial fibrillation) ([place])   . PONV  (postoperative nausea and vomiting)   . Pulmonary nodules BENIGN  PER CT  10-12-2010  . Rectal Cancer 08/2010   adenocarcinoma   S/P PARTIAL PROCTECTOMY (NO CHEMO OR RADIATION)  . 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 S/P coronary artery stent placement 08/2010   X2  BM    Past Surgical History:  Procedure Laterality Date  . ABDOMINAL HYSTERECTOMY  1950's   AND APPENDECTOMY  . CATARACT EXTRACTION W/ INTRAOCULAR LENS  IMPLANT, BILATERAL    . CORONARY ANGIOPLASTY WITH STENT PLACEMENT  08-26-2010  DR [place]   PCI, BM STENT IN RCA  . CORONARY ANGIOPLASTY WITH STENT PLACEMENT  08-29-2010  DR [place]   PCI, BM STENT IN PROXIMAL CIRCUMFLEX  . EXCISION BENIGN CYST RIGHT BREAST    . FISTULA PLUG N/A 06/21/2012   Procedure: insertion of FISTULA PLUG;  Surgeon: Leighton Ruff, MD;  Location: [facility];  Service: General;  Laterality: N/A;  . FLEXIBLE SIGMOIDOSCOPY N/A 04/02/2012   Procedure: FLEXIBLE SIGMOIDOSCOPY;  Surgeon: Leighton Ruff, MD;  Location: [facility];  Service: Endoscopy;  Laterality: N/A;  . KNEE SURGERY Right 1996  . LAPROSCOPY LYSIS ADHESIONS/ DRAINAGE OF PELVIC ABSCESS/ DIVERTING LOOP SIGMOID COLECTOMY  11-22-2010   POST OP RECTOVAGINAL FISTULA  .  PARTIAL PROCTECTOMY BY TEM  11-17-2010   RECTAL CANCER  . RELEASE LEFT CARPAL TUNNEL/ OSTEOTOMY LEFT DISTAL RADIUS  11-10-2009  . STAPEDECTOMY  1970'S  . TONSILLECTOMY  CHILD  . TOTAL HIP ARTHROPLASTY Right 1992  . TRANSTHORACIC ECHOCARDIOGRAM  10-10-2011   NORMAL LV SIZE WITH MILD FOCAL BASAL SEPTAL HYPERTROPHY/ EF 55-60%/ NORMAL RV SIZE AND LVSF/ BIATRIAL ENLARGEMENT/ MILD TO MODERATE AI  &  TR  . TYMPANOPLASTY Left 12-23-2009    Current Outpatient Prescriptions  Medication Sig Dispense Refill  . acetaminophen (TYLENOL) 500 MG tablet Take 2 tablets (1,000 mg total) by mouth every 6 (six) hours as needed for mild pain. 30  tablet 0  . ALPRAZolam (XANAX) 0.25 MG tablet Take 0.125 mg by mouth 2 (two) times daily.    Marland Kitchen aspirin EC 81 MG tablet Take 81 mg by mouth daily.    Marland Kitchen aspirin-acetaminophen-caffeine (EXCEDRIN MIGRAINE) 250-250-65 MG per tablet Take 1 tablet by mouth every 6 (six) hours as needed for headache.    . beta carotene w/minerals (OCUVITE) tablet Take 1 tablet by mouth daily.    Marland Kitchen BIOTIN PO Take 2 tablets by mouth daily.    . cholecalciferol (VITAMIN D) 1000 units tablet Take 1,000 Units by mouth daily.    . feeding supplement, ENSURE ENLIVE, (ENSURE ENLIVE) LIQD Take 237 mLs by mouth daily at 3 pm. 237 mL 12  . metoprolol tartrate (LOPRESSOR) 25 MG tablet TAKE ONE TABLET BY MOUTH TWICE DAILY 60 tablet 10  . Multiple Vitamin (MULTIVITAMIN) tablet Take 1 tablet by mouth daily.     Marland Kitchen neomycin-bacitracin-polymyxin (NEOSPORIN) OINT Apply 1 application topically as needed for wound care.    . nitroGLYCERIN (NITROSTAT) 0.4 MG SL tablet Place 1 tablet (0.4 mg total) under the tongue every 5 (five) minutes as needed for chest pain (MAX 3 TABLETS). 25 tablet 6  . Polyethyl Glycol-Propyl Glycol (SYSTANE) 0.4-0.3 % GEL Apply 1 drop to eye at bedtime.     . traMADol (ULTRAM) 50 MG tablet Take 1 tablet (50 mg total) by mouth every 6 (six) hours as needed for severe pain. 30 tablet 0  . sertraline (ZOLOFT) 25 MG tablet Take 25 mg by mouth at bedtime. 1/2 tablet daily at bedtime     No current facility-administered medications for this visit.     Allergies  Allergen Reactions  . Effexor [Venlafaxine Hydrochloride] Other (See Comments)    UNKNOWN  . Penicillins Shortness Of Breath    Has patient had a PCN reaction causing immediate rash, facial/tongue/throat swelling, SOB or lightheadedness with hypotension: Unknown Has patient had a PCN reaction causing severe rash involving mucus membranes or skin necrosis: No Has patient had a PCN reaction that required hospitalization No Has patient had a PCN reaction  occurring within the last 10 years: No If all of the above answers are "NO", then may proceed with Cephalosporin use.   . Sulfa Antibiotics Nausea Only  . Dicyclomine Other (See Comments)    Caused confusion  . Hydrocodone Nausea Only    Social History   Social History  . Marital status: Widowed    Spouse name: N/A  . Number of children: N/A  . Years of education: N/A   Occupational History  . Not on file.   Social History Main Topics  . Smoking status: Never Smoker  . Smokeless tobacco: Never Used  . Alcohol use No  . Drug use: No  . Sexual activity: Not on file   Other Topics Concern  .  Not on file   Social History Narrative  . No narrative on file    Family History  Problem Relation Age of Onset  . Kidney disease Mother   . Heart disease Father   . Cancer Brother     Review of Systems:  As stated in the HPI and otherwise negative.   BP (!) 146/50   Pulse 63   Ht 5\' 3"  (1.6 m)   Wt 108 lb 12.8 oz (49.4 kg)   SpO2 93%   BMI 19.27 kg/m   Physical Examination:  General: Well developed, well nourished, NAD  HEENT: OP clear, mucus membranes moist  SKIN: warm, dry. No rashes. Neuro: No focal deficits  Musculoskeletal: Muscle strength 5/5 all ext  Psychiatric: Mood and affect normal  Neck: No JVD, no carotid bruits, no thyromegaly, no lymphadenopathy.  Lungs:Clear bilaterally, no wheezes, rhonci, crackles Cardiovascular: Regular rate and rhythm. No murmurs, gallops or rubs. Abdomen:Soft. Bowel sounds present. Non-tender.  Extremities: No lower extremity edema. Pulses are 2 + in the bilateral DP/PT.  Echo 04/23/14: Left ventricle: The cavity size was normal. There was focal basal hypertrophy. Systolic function was normal. The estimated ejection fraction was in the range of 55% to 60%. Wall motion was normal; there were no regional wall motion abnormalities. Doppler parameters are consistent with abnormal left ventricular relaxation (grade 1  diastolic dysfunction). - Aortic valve: Mildly calcified annulus. Mildly thickened leaflets. There was moderate regurgitation. - Mitral valve: Moderately calcified annulus. There was mild regurgitation. Mean gradient (D): 5 mm Hg. Peak gradient (D): 10 mm Hg. - Left atrium: The atrium was moderately dilated. - Tricuspid valve: There was moderate regurgitation. - Pulmonary arteries: Systolic pressure was mildly increased. PA peak pressure: 38 mm Hg (S).  EKG:  EKG is not ordered today. The ekg ordered today demonstrates   Recent Labs: 11/13/2015: BUN 17; Creatinine, Ser 0.75; Hemoglobin 11.9; Platelets 256; Potassium 4.0; Sodium 137   Lipid Panel    Component Value Date/Time   CHOL 129 07/31/2011 1010   TRIG 88.0 07/31/2011 1010   HDL 74.20 07/31/2011 1010   CHOLHDL 2 07/31/2011 1010   VLDL 17.6 07/31/2011 1010   LDLCALC 37 07/31/2011 1010     Wt Readings from Last 3 Encounters:  07/19/16 108 lb 12.8 oz (49.4 kg)  01/17/16 108 lb (49 kg)  11/17/15 109 lb (49.4 kg)     Other studies Reviewed: Additional studies/ records that were reviewed today include: . Review of the above records demonstrates:    Assessment and Plan:   1. CAD without angina: She has no chest pain suggestive of angina. LV function is normal by echo in 2016. She is on ASA and beta blocker. She has not been on a statin for years given advanced age. Lipids are no longer followed.    2. Atrial flutter: She is having no palpitations. She is on lopressor. No anti-coagulation given advanced age.     3. HTN: BP is controlled.   4. Aortic insufficiency: Moderate by echo in 2016. No plans to repeat echo given advanced age.    Current medicines are reviewed at length with the patient today.  The patient does not have concerns regarding medicines.  The following changes have been made:  no change  Labs/ tests ordered today include:  No orders of the defined types were placed in this  encounter.   Disposition:   FU with me in 6 months  Signed, Lauree Chandler, MD 07/19/2016 2:28 PM  [facility] [place], [place], [place]  [address] Phone: [phone]; Fax: [phone]

## 2016-07-19 NOTE — Patient Instructions (Signed)

## 2016-08-23 ENCOUNTER — Encounter: Payer: Self-pay | Admitting: Physician Assistant

## 2016-08-24 ENCOUNTER — Ambulatory Visit: Payer: Medicare Other | Admitting: Cardiovascular Disease

## 2016-09-01 ENCOUNTER — Encounter: Payer: Self-pay | Admitting: Physician Assistant

## 2016-09-01 ENCOUNTER — Ambulatory Visit (INDEPENDENT_AMBULATORY_CARE_PROVIDER_SITE_OTHER): Payer: Medicare Other | Admitting: Physician Assistant

## 2016-09-01 ENCOUNTER — Encounter (INDEPENDENT_AMBULATORY_CARE_PROVIDER_SITE_OTHER): Payer: Self-pay

## 2016-09-01 VITALS — BP 124/52 | HR 64 | Ht 63.0 in | Wt 109.0 lb

## 2016-09-01 DIAGNOSIS — R194 Change in bowel habit: Secondary | ICD-10-CM

## 2016-09-01 NOTE — Progress Notes (Signed)
Reviewed and agree with documentation and assessment and plan. K. Veena Nandigam , MD   

## 2016-09-01 NOTE — Progress Notes (Signed)
Chief Complaint: Change in bowel habits  HPI:  [name] is a 83 year old Caucasian female with a past medical history as listed below including rectal cancer status post partial prostectomy with colostomy, who was referred to me by Hayden Rasmussen, MD for a complaint of change in bowel habits .     Patient previously followed with [facility], we do not have records.   Recent labs completed 08/21/16 showing normal CMP and CBC as well as TSH.   Today, the patient presents to clinic and tells me that over the past couple of months she has noticed a change in her bowel habits. She spends time discussing all these changes with me including some change in color as well as a variation from constipation or hard stools to looser stools. Patient was initially trying to eliminate foods from her diet but continued with looser stools. She then took Imodium which made her constipated. She saw her primary care provider and it was recommended she take Benefiber twice daily. This also made her constipated so she stopped the fiber and continued with diarrhea. She then followed with them again and it was recommended she use 1 teaspoon of fiber in an 8 ounce glass of water per day instead of 2 and patient tells me that this typically has been working for her over the past week. Occasionally, she has no bowel movement at all but most of the time her bowel movements are a little more consistent. She also discuss variation in color with me from green to darker brown depending on what she is eating. She also tells me that she sometimes sees undigested nuts in her colostomy bag. She asks me there is anything she should not be eating. Patient also has occasional lower abdominal cramping before a bowel movement relieved afterwards.   Medical history is pertinent for starting antibiotics this past Wednesday for a UTI.   Patient denies fever, chills, blood in her stool, melena, weight loss, anorexia, nausea, vomiting, heartburn or  reflux.  Past Medical History:  Diagnosis Date  . Arthritis    FINGERS   . CAD (coronary artery disease) CARDIOLOGIST-- DR Angelena Form   a. s/p INF STEMI 7/12: tx with BMS to RCA;  b. cath 08/26/10: pLAD 30%, mLAD 50%, D1 40%, pCFX 95%, mRCA occluded;   c. staged PCI of pCFX with BMS;   d. echo 7/12:   EF 60-65%, mild RAE, mild to moderate AI, mild MR, moderate TR, RVE, PASP 47  . Complication of anesthesia PT STATES "MADE HER FEEL CRAZY"  . Degeneration of eye    left eye cornea  . Dyspnea   . First degree heart block   . Heart palpitations PAC'S AND SVT RUN'S  PER CARDIOLOGIST NOTE  . History of ST elevation myocardial infarction (STEMI) 08-26-2010-- INFERIOR WALL   S/P PCI  BMS IN RCA AND PROX. CX  . Hyperlipidemia   . Hypertension   . Impaired hearing BILATERAL HEARING AIDS  . Osteopenia   . PAF (paroxysmal atrial fibrillation) ([place])   . PONV (postoperative nausea and vomiting)   . Pulmonary nodules BENIGN  PER CT  10-12-2010  . Rectal Cancer 08/2010   adenocarcinoma   S/P PARTIAL PROCTECTOMY (NO CHEMO OR RADIATION)  . 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 S/P coronary artery stent placement 08/2010   X2  BM    Past Surgical History:  Procedure Laterality Date  .  ABDOMINAL HYSTERECTOMY  1950's   AND APPENDECTOMY  . CATARACT EXTRACTION W/ INTRAOCULAR LENS  IMPLANT, BILATERAL    . CORONARY ANGIOPLASTY WITH STENT PLACEMENT  08-26-2010  DR [place]   PCI, BM STENT IN RCA  . CORONARY ANGIOPLASTY WITH STENT PLACEMENT  08-29-2010  DR [place]   PCI, BM STENT IN PROXIMAL CIRCUMFLEX  . EXCISION BENIGN CYST RIGHT BREAST    . FISTULA PLUG N/A 06/21/2012   Procedure: insertion of FISTULA PLUG;  Surgeon: Leighton Ruff, MD;  Location: [facility];  Service: General;  Laterality: N/A;  . FLEXIBLE SIGMOIDOSCOPY N/A 04/02/2012   Procedure: FLEXIBLE SIGMOIDOSCOPY;  Surgeon: Leighton Ruff, MD;   Location: [facility];  Service: Endoscopy;  Laterality: N/A;  . KNEE SURGERY Right 1996  . LAPROSCOPY LYSIS ADHESIONS/ DRAINAGE OF PELVIC ABSCESS/ DIVERTING LOOP SIGMOID COLECTOMY  11-22-2010   POST OP RECTOVAGINAL FISTULA  . PARTIAL PROCTECTOMY BY TEM  11-17-2010   RECTAL CANCER  . RELEASE LEFT CARPAL TUNNEL/ OSTEOTOMY LEFT DISTAL RADIUS  11-10-2009  . STAPEDECTOMY  1970'S  . TONSILLECTOMY  CHILD  . TOTAL HIP ARTHROPLASTY Right 1992  . TRANSTHORACIC ECHOCARDIOGRAM  10-10-2011   NORMAL LV SIZE WITH MILD FOCAL BASAL SEPTAL HYPERTROPHY/ EF 55-60%/ NORMAL RV SIZE AND LVSF/ BIATRIAL ENLARGEMENT/ MILD TO MODERATE AI  &  TR  . TYMPANOPLASTY Left 12-23-2009    Current Outpatient Prescriptions  Medication Sig Dispense Refill  . acetaminophen (TYLENOL) 500 MG tablet Take 2 tablets (1,000 mg total) by mouth every 6 (six) hours as needed for mild pain. 30 tablet 0  . ALPRAZolam (XANAX) 0.25 MG tablet Take 0.125 mg by mouth 2 (two) times daily.    Marland Kitchen aspirin EC 81 MG tablet Take 81 mg by mouth daily.    Marland Kitchen aspirin-acetaminophen-caffeine (EXCEDRIN MIGRAINE) 250-250-65 MG per tablet Take 1 tablet by mouth every 6 (six) hours as needed for headache.    . beta carotene w/minerals (OCUVITE) tablet Take 1 tablet by mouth daily.    Marland Kitchen BIOTIN PO Take 2 tablets by mouth daily.    . cholecalciferol (VITAMIN D) 1000 units tablet Take 1,000 Units by mouth daily.    . feeding supplement, ENSURE ENLIVE, (ENSURE ENLIVE) LIQD Take 237 mLs by mouth daily at 3 pm. 237 mL 12  . metoprolol tartrate (LOPRESSOR) 25 MG tablet TAKE ONE TABLET BY MOUTH TWICE DAILY 60 tablet 10  . Multiple Vitamin (MULTIVITAMIN) tablet Take 1 tablet by mouth daily.     Marland Kitchen neomycin-bacitracin-polymyxin (NEOSPORIN) OINT Apply 1 application topically as needed for wound care.    . nitroGLYCERIN (NITROSTAT) 0.4 MG SL tablet Place 1 tablet (0.4 mg total) under the tongue every 5 (five) minutes as needed for chest pain (MAX 3 TABLETS). 25 tablet 6    . Polyethyl Glycol-Propyl Glycol (SYSTANE) 0.4-0.3 % GEL Apply 1 drop to eye at bedtime.     . sertraline (ZOLOFT) 25 MG tablet Take 25 mg by mouth at bedtime. 1/2 tablet daily at bedtime    . traMADol (ULTRAM) 50 MG tablet Take 1 tablet (50 mg total) by mouth every 6 (six) hours as needed for severe pain. 30 tablet 0   No current facility-administered medications for this visit.     Allergies as of 09/01/2016 - Review Complete 09/01/2016  Allergen Reaction Noted  . Effexor [venlafaxine hydrochloride] Other (See Comments) 08/31/2010  . Penicillins Shortness Of Breath 04/15/2014  . Sulfa antibiotics Nausea Only 08/31/2010  . Dicyclomine Other (See Comments) 09/25/2013  . Hydrocodone Nausea  Only 09/07/2010    Family History  Problem Relation Age of Onset  . Kidney disease Mother   . Heart disease Father   . Cancer Brother     Social History   Social History  . Marital status: Widowed    Spouse name: N/A  . Number of children: N/A  . Years of education: N/A   Occupational History  . Not on file.   Social History Main Topics  . Smoking status: Never Smoker  . Smokeless tobacco: Never Used  . Alcohol use No  . Drug use: No  . Sexual activity: Not on file   Other Topics Concern  . Not on file   Social History Narrative  . No narrative on file    Review of Systems:    Constitutional: No weight loss, fever or chills Skin: No rash Cardiovascular: No chest pain Respiratory: No SOB Gastrointestinal: See HPI and otherwise negative Genitourinary: No dysuria  Neurological: No headache Musculoskeletal: No new muscle or joint pain Hematologic: No bleeding or bruising Psychiatric: No history of depression or anxiety   Physical Exam:  Vital signs: BP (!) 124/52   Pulse 64   Ht 5\' 3"  (1.6 m)   Wt 109 lb (49.4 kg)   BMI 19.31 kg/m    Constitutional:   Pleasant Elderly Caucasian female appears to be in NAD, Well developed, Well nourished, alert and cooperative Head:   Normocephalic and atraumatic. Eyes:   PEERL, EOMI. No icterus. Conjunctiva pink. Ears:  Normal auditory acuity. Neck:  Supple Throat: Oral cavity and pharynx without inflammation, swelling or lesion.  Respiratory: Respirations even and unlabored. Lungs clear to auscultation bilaterally.   No wheezes, crackles, or rhonchi.  Cardiovascular: Normal S1, S2. No MRG. Regular rate and rhythm. No peripheral edema, cyanosis or pallor.  Gastrointestinal:  Soft, nondistended, nontender. No rebound or guarding. Normal bowel sounds. No appreciable masses or hepatomegaly.Colostomy LLQ-small brown fecal residue, solid, no erythema or irritation surrounding Rectal:  Not performed.  Msk:  Symmetrical without gross deformities. Without edema, no deformity or joint abnormality. Ambulates with cane Neurologic:  Alert and  oriented x4;  grossly normal neurologically.  Skin:   Dry and intact without significant lesions or rashes. Psychiatric:  Demonstrates good judgement and reason without abnormal affect or behaviors.  No recent labs or imaging.  Assessment: 1. Change in bowel habits: Patient describes changes in color and changes in consistency, better now with fiber  Plan: 1. Continue fiber one teaspoon in 8 ounces of water per day. 2. Patient just started a probiotic, if she runs out of this she can try Align 3. Discussed the patient that nothing she is telling me is alarming and sounds like normal variance. Recommend she continue with that she is doing for now and call us she has an increase or worsening of symptoms. Did explain that while on antibiotics she could have some looser stool which would be normal, as long as it clears afterwards. 4. Patient to return to clinic as needed in the future with myself or Dr. Silverio Decamp as she is supervising this morning.  Ellouise Newer, PA-C [facility] 09/01/2016, 10:13 AM  Cc: Hayden Rasmussen, MD

## 2016-09-01 NOTE — Patient Instructions (Signed)
Align probiotic  Continue Fiber supplement one teaspoon in 8 oz of water daily.

## 2016-09-26 ENCOUNTER — Telehealth: Payer: Self-pay | Admitting: Cardiovascular Disease

## 2016-09-26 NOTE — Telephone Encounter (Signed)
Patient calling, states that she had an episode last night so she called 911. Patient states that she had an EKG and was told to contact office to discuss last night.   Patient would also like to mention that she did not go to hospital.

## 2016-09-26 NOTE — Telephone Encounter (Signed)
Patient called to report she has been a little weak recently due to UTI and medications she is taking for it.  Last night, she woke up about 2230 to use the bathroom. On her way, she experienced chest and left arm discomfort about 5/10 on pain scale. She sat down, took 1 tablet NTG and 2 baby ASA.  She waited about 30 minutes and had no relief so she called 911.  EMS arrived and completed an EKG. She states they said it was normal "except one little questionable part that is probably from the UTI." She declined any further treatment. EMS instructed her to call her cardiologist for follow-up in the morning and left her home. The patient sat in her chair for a while after EMS left and then went back to sleep. She woke up this morning with no discomfort in her chest or arm at all and says she feels fine. Offered patient an appointment tomorrow for evaluation but she declined. She states she will call back if another episode occurs. She was grateful for call.

## 2017-02-22 ENCOUNTER — Encounter (HOSPITAL_COMMUNITY): Payer: Self-pay | Admitting: Emergency Medicine

## 2017-02-22 ENCOUNTER — Emergency Department (HOSPITAL_COMMUNITY): Payer: Medicare Other

## 2017-02-22 ENCOUNTER — Observation Stay (HOSPITAL_COMMUNITY)
Admission: EM | Admit: 2017-02-22 | Discharge: 2017-02-24 | Disposition: A | Discharge status: Home / Self Care | Payer: Medicare Other | Attending: Internal Medicine | Admitting: Internal Medicine

## 2017-02-22 DIAGNOSIS — F419 Anxiety disorder, unspecified: Secondary | ICD-10-CM | POA: Diagnosis not present

## 2017-02-22 DIAGNOSIS — R4781 Slurred speech: Secondary | ICD-10-CM | POA: Diagnosis not present

## 2017-02-22 DIAGNOSIS — Z85048 Personal history of other malignant neoplasm of rectum, rectosigmoid junction, and anus: Secondary | ICD-10-CM | POA: Insufficient documentation

## 2017-02-22 DIAGNOSIS — Z7982 Long term (current) use of aspirin: Secondary | ICD-10-CM | POA: Diagnosis not present

## 2017-02-22 DIAGNOSIS — I480 Paroxysmal atrial fibrillation: Secondary | ICD-10-CM | POA: Diagnosis not present

## 2017-02-22 DIAGNOSIS — F329 Major depressive disorder, single episode, unspecified: Secondary | ICD-10-CM | POA: Insufficient documentation

## 2017-02-22 DIAGNOSIS — I10 Essential (primary) hypertension: Secondary | ICD-10-CM | POA: Diagnosis not present

## 2017-02-22 DIAGNOSIS — Z955 Presence of coronary angioplasty implant and graft: Secondary | ICD-10-CM | POA: Diagnosis not present

## 2017-02-22 DIAGNOSIS — F418 Other specified anxiety disorders: Secondary | ICD-10-CM | POA: Diagnosis present

## 2017-02-22 DIAGNOSIS — Z79899 Other long term (current) drug therapy: Secondary | ICD-10-CM | POA: Insufficient documentation

## 2017-02-22 DIAGNOSIS — I2510 Atherosclerotic heart disease of native coronary artery without angina pectoris: Secondary | ICD-10-CM | POA: Diagnosis present

## 2017-02-22 DIAGNOSIS — R0602 Shortness of breath: Secondary | ICD-10-CM

## 2017-02-22 DIAGNOSIS — R4182 Altered mental status, unspecified: Secondary | ICD-10-CM | POA: Diagnosis present

## 2017-02-22 DIAGNOSIS — E785 Hyperlipidemia, unspecified: Secondary | ICD-10-CM | POA: Diagnosis not present

## 2017-02-22 DIAGNOSIS — I252 Old myocardial infarction: Secondary | ICD-10-CM | POA: Insufficient documentation

## 2017-02-22 DIAGNOSIS — G459 Transient cerebral ischemic attack, unspecified: Principal | ICD-10-CM | POA: Insufficient documentation

## 2017-02-22 DIAGNOSIS — Z96641 Presence of right artificial hip joint: Secondary | ICD-10-CM | POA: Diagnosis not present

## 2017-02-22 DIAGNOSIS — C20 Malignant neoplasm of rectum: Secondary | ICD-10-CM | POA: Diagnosis present

## 2017-02-22 LAB — COMPREHENSIVE METABOLIC PANEL
ALK PHOS: 62 U/L (ref 38–126)
ALT: 20 U/L (ref 14–54)
AST: 31 U/L (ref 15–41)
Albumin: 3.7 g/dL (ref 3.5–5.0)
Anion gap: 10 (ref 5–15)
BUN: 22 mg/dL — AB (ref 6–20)
CALCIUM: 10 mg/dL (ref 8.9–10.3)
CO2: 22 mmol/L (ref 22–32)
CREATININE: 0.76 mg/dL (ref 0.44–1.00)
Chloride: 105 mmol/L (ref 101–111)
Glucose, Bld: 83 mg/dL (ref 65–99)
Potassium: 4.1 mmol/L (ref 3.5–5.1)
Sodium: 137 mmol/L (ref 135–145)
Total Bilirubin: 0.7 mg/dL (ref 0.3–1.2)
Total Protein: 7.4 g/dL (ref 6.5–8.1)

## 2017-02-22 LAB — CBC WITH DIFFERENTIAL/PLATELET
BASOS PCT: 0 %
Basophils Absolute: 0 10*3/uL (ref 0.0–0.1)
EOS ABS: 0.2 10*3/uL (ref 0.0–0.7)
EOS PCT: 3 %
HCT: 40.4 % (ref 36.0–46.0)
HEMOGLOBIN: 13.2 g/dL (ref 12.0–15.0)
LYMPHS ABS: 3.3 10*3/uL (ref 0.7–4.0)
Lymphocytes Relative: 44 %
MCH: 30.9 pg (ref 26.0–34.0)
MCHC: 32.7 g/dL (ref 30.0–36.0)
MCV: 94.6 fL (ref 78.0–100.0)
MONOS PCT: 7 %
Monocytes Absolute: 0.5 10*3/uL (ref 0.1–1.0)
Neutro Abs: 3.4 10*3/uL (ref 1.7–7.7)
Neutrophils Relative %: 46 %
PLATELETS: 204 10*3/uL (ref 150–400)
RBC: 4.27 MIL/uL (ref 3.87–5.11)
RDW: 12.5 % (ref 11.5–15.5)
WBC: 7.4 10*3/uL (ref 4.0–10.5)

## 2017-02-22 LAB — TROPONIN I: Troponin I: <0.03 ng/mL (ref <0.03)

## 2017-02-22 LAB — CBG MONITORING, ED: GLUCOSE-CAPILLARY: 72 mg/dL (ref 65–99)

## 2017-02-22 MED ORDER — PROMETHAZINE HCL 25 MG/ML IJ SOLN
12.5000 mg | Freq: Once | INTRAMUSCULAR | Status: AC
  Administered 2017-02-22: 12.5 mg via INTRAVENOUS
  Filled 2017-02-22: qty 1

## 2017-02-22 MED ORDER — ONDANSETRON HCL 4 MG/2ML IJ SOLN: 4.0000 mg | Freq: Once | INTRAMUSCULAR | Status: DC

## 2017-02-22 MED ORDER — ASPIRIN 325 MG PO TABS
325.0000 mg | ORAL_TABLET | Freq: Once | ORAL | Status: AC
Start: 2017-02-22 — End: 2017-02-22
  Administered 2017-02-22: 325 mg via ORAL

## 2017-02-22 NOTE — ED Notes (Signed)
EDP at bedside  

## 2017-02-22 NOTE — H&P (Signed)
History and Physical    EDYN POPOCA IRJ:[id number] DOB: 12-07-1916 DOA: 02/22/2017  Referring MD/NP/PA:   PCP: Leonard Downing, MD   Patient coming from:  The patient is coming from home.  At baseline, pt is independent for most of ADL.  Chief Complaint: slurred speech and confusion  HPI: [name] is a 84 y.o. female with medical history significant of PAF not on anticoagulants, hypertension, hyperlipidemia, depression and anxiety, CAD, STEMI, stent placement, rectal cancer (s/p of partial proctectomy), right ear deaf, s/p of tympanoplasty, who presents with slurred speech and confusion.  Per her niece, patient had one episode of a confusion and slurred speech that happened at about 8:30 PM. The symptoms lasted for about 30 minutes, then resolved spontaneously. Patient has mild right facial droop, but that is her baseline appearance per family. Patient has headache. No unilateral weakness, numbness or tingling. Extremities. No vision change. Patient states that she has been having dry cough and a mild shortness of breath. He wakes. Denies chest pain, fever or chills. Patient does not have nausea, vomiting, diarrhea, abdominal pain. Patient has some mild dysuria, but no burning on urination or increased urinary frequency.   ED Course: pt was found to have  WBC 7.4, negative troponin, pending urinalysis, electrolytes renal function okay, temperature normal, no tachycardia, oxygen saturation 96% on room air, CT head is negative for acute intracranial abnormalities. Patient is placed on telemetry bed for observation.   Review of Systems:   General: no fevers, chills, no body weight gain, has fatigue HEENT: no blurry vision, hearing changes or sore throat Respiratory: has dyspnea, coughing, no wheezing CV: no chest pain, no palpitations GI: no nausea, vomiting, abdominal pain, diarrhea, constipation GU: no dysuria, burning on urination, increased urinary frequency, hematuria    Ext: no leg edema Neuro: no unilateral weakness, numbness, or tingling, no vision change. Had slurred speech and confusion. Skin: no rash, no skin tear. MSK: No muscle spasm, no deformity, no limitation of range of movement in spin Heme: No easy bruising.  Travel history: No recent long distant travel.  Allergy:  Allergies  Allergen Reactions  . Effexor [Venlafaxine Hydrochloride] Other (See Comments)    Reaction not recalled by the patient  . Penicillins Shortness Of Breath    Has patient had a PCN reaction causing immediate rash, facial/tongue/throat swelling, SOB or lightheadedness with hypotension: Yes Has patient had a PCN reaction causing severe rash involving mucus membranes or skin necrosis: No Has patient had a PCN reaction that required hospitalization: No Has patient had a PCN reaction occurring within the last 10 years: No If all of the above answers are "NO", then may proceed with Cephalosporin use.   . Sulfa Antibiotics Nausea Only  . Dicyclomine Other (See Comments)    Caused confusion  . Hydrocodone Nausea Only    Past Medical History:  Diagnosis Date  . Arthritis    FINGERS   . CAD (coronary artery disease) CARDIOLOGIST-- DR Angelena Form   a. s/p INF STEMI 7/12: tx with BMS to RCA;  b. cath 08/26/10: pLAD 30%, mLAD 50%, D1 40%, pCFX 95%, mRCA occluded;   c. staged PCI of pCFX with BMS;   d. echo 7/12:   EF 60-65%, mild RAE, mild to moderate AI, mild MR, moderate TR, RVE, PASP 47  . Complication of anesthesia PT STATES "MADE HER FEEL CRAZY"  . Degeneration of eye    left eye cornea  . Dyspnea   . First degree heart block   .  Heart palpitations PAC'S AND SVT RUN'S  PER CARDIOLOGIST NOTE  . History of ST elevation myocardial infarction (STEMI) 08-26-2010-- INFERIOR WALL   S/P PCI  BMS IN RCA AND PROX. CX  . Hyperlipidemia   . Hypertension   . Impaired hearing BILATERAL HEARING AIDS  . Osteopenia   . PAF (paroxysmal atrial fibrillation) ([place])   . PONV  (postoperative nausea and vomiting)   . Pulmonary nodules BENIGN  PER CT  10-12-2010  . Rectal Cancer 08/2010   adenocarcinoma   S/P PARTIAL PROCTECTOMY (NO CHEMO OR RADIATION)  . 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 S/P coronary artery stent placement 08/2010   X2  BM    Past Surgical History:  Procedure Laterality Date  . ABDOMINAL HYSTERECTOMY  1950's   AND APPENDECTOMY  . CATARACT EXTRACTION W/ INTRAOCULAR LENS  IMPLANT, BILATERAL    . CORONARY ANGIOPLASTY WITH STENT PLACEMENT  08-26-2010  DR [place]   PCI, BM STENT IN RCA  . CORONARY ANGIOPLASTY WITH STENT PLACEMENT  08-29-2010  DR Swan   PCI, BM STENT IN PROXIMAL CIRCUMFLEX  . EXCISION BENIGN CYST RIGHT BREAST    . FISTULA PLUG N/A 06/21/2012   Procedure: insertion of FISTULA PLUG;  Surgeon: Leighton Ruff, MD;  Location: [facility];  Service: General;  Laterality: N/A;  . FLEXIBLE SIGMOIDOSCOPY N/A 04/02/2012   Procedure: FLEXIBLE SIGMOIDOSCOPY;  Surgeon: Leighton Ruff, MD;  Location: [facility];  Service: Endoscopy;  Laterality: N/A;  . KNEE SURGERY Right 1996  . LAPROSCOPY LYSIS ADHESIONS/ DRAINAGE OF PELVIC ABSCESS/ DIVERTING LOOP SIGMOID COLECTOMY  11-22-2010   POST OP RECTOVAGINAL FISTULA  . PARTIAL PROCTECTOMY BY TEM  11-17-2010   RECTAL CANCER  . RELEASE LEFT CARPAL TUNNEL/ OSTEOTOMY LEFT DISTAL RADIUS  11-10-2009  . STAPEDECTOMY  1970'S  . TONSILLECTOMY  CHILD  . TOTAL HIP ARTHROPLASTY Right 1992  . TRANSTHORACIC ECHOCARDIOGRAM  10-10-2011   NORMAL LV SIZE WITH MILD FOCAL BASAL SEPTAL HYPERTROPHY/ EF 55-60%/ NORMAL RV SIZE AND LVSF/ BIATRIAL ENLARGEMENT/ MILD TO MODERATE AI  &  TR  . TYMPANOPLASTY Left 12-23-2009    Social History:  reports that  has never smoked. she has never used smokeless tobacco. She reports that she does not drink alcohol or use drugs.  Family History:  Family History  Problem  Relation Age of Onset  . Kidney disease Mother   . Heart disease Father   . Cancer Brother      Prior to Admission medications   Medication Sig Start Date End Date Taking? Authorizing Provider  acetaminophen (TYLENOL) 500 MG tablet Take 2 tablets (1,000 mg total) by mouth every 6 (six) hours as needed for mild pain. 07/06/15  Yes Rama, Venetia Maxon, MD  aspirin EC 81 MG tablet Take 81 mg by mouth daily.   Yes [provider]  aspirin-acetaminophen-caffeine (EXCEDRIN MIGRAINE) [phone] MG per tablet Take 1 tablet by mouth every 6 (six) hours as needed for headache.   Yes [provider]  beta carotene w/minerals (OCUVITE) tablet Take 1 tablet by mouth daily.   Yes [provider]  BIOTIN PO Take 1 tablet by mouth daily.    Yes [provider]  feeding supplement, ENSURE ENLIVE, (ENSURE ENLIVE) LIQD Take 237 mLs by mouth daily at 3 pm. 07/06/15  Yes Rama, Venetia Maxon, MD  metoprolol tartrate (LOPRESSOR) 25 MG tablet TAKE ONE TABLET BY MOUTH TWICE DAILY Patient taking differently:  Take 12.5 mg by mouth two times a day 03/09/16  Yes Burnell Blanks, MD  Multiple Vitamin (MULTIVITAMIN) tablet Take 1 tablet by mouth daily.    Yes [provider]  nitroGLYCERIN (NITROSTAT) 0.4 MG SL tablet Place 1 tablet (0.4 mg total) under the tongue every 5 (five) minutes as needed for chest pain (MAX 3 TABLETS). 10/31/13  Yes Burnell Blanks, MD  Polyethyl Glycol-Propyl Glycol (SYSTANE) 0.4-0.3 % GEL Place 1 drop into both eyes at bedtime.    Yes [provider]  sertraline (ZOLOFT) 25 MG tablet Take 25 mg by mouth at bedtime.  04/21/16  Yes [provider]  ALPRAZolam (XANAX) 0.25 MG tablet Take 0.125 mg by mouth 2 (two) times daily.    [provider]  cholecalciferol (VITAMIN D) 1000 units tablet Take 1,000 Units by mouth daily.    [provider]  neomycin-bacitracin-polymyxin (NEOSPORIN) OINT Apply 1 application  topically as needed for wound care. Patient not taking: Reported on 02/22/2017 07/06/15   Rama, Venetia Maxon, MD  traMADol (ULTRAM) 50 MG tablet Take 1 tablet (50 mg total) by mouth every 6 (six) hours as needed for severe pain. Patient not taking: Reported on 02/22/2017 07/06/15   Rama, Venetia Maxon, MD    Physical Exam: Vitals:   02/23/17 0300 02/23/17 0330 02/23/17 0501 02/23/17 0542  BP: (!) 103/48 (!) 119/41 (!) 164/58 (!) 130/48  Pulse: 67 66 72 65  Resp: 15 15 16    Temp:   (!) 97.4 F (36.3 C) 98.2 F (36.8 C)  TempSrc:   Oral Oral  SpO2: 96% 96% 99% 97%  Weight:    50.1 kg (110 lb 7.2 oz)  Height:    5\' 3"  (1.6 m)   General: Not in acute distress HEENT:       Eyes: PERRL, EOMI, no scleral icterus.       ENT: No discharge from the ears and nose, no pharynx injection, no tonsillar enlargement.        Neck: No JVD, no bruit, no mass felt. Heme: No neck lymph node enlargement. Cardiac: S1/S2, RRR, No murmurs, No gallops or rubs. Respiratory: No rales, wheezing, rhonchi or rubs. GI: Soft, nondistended, nontender, no rebound pain, no organomegaly, BS present. GU: No hematuria Ext: No pitting leg edema bilaterally. 2+DP/PT pulse bilaterally. Musculoskeletal: No joint deformities, No joint redness or warmth, no limitation of ROM in spin. Skin: No rashes.  Neuro: Alert, oriented X3, cranial nerves II-XII grossly intact, moves all extremities normally. Muscle strength 5/5 in all extremities, sensation to light touch intact. Brachial reflex 2+ bilaterally. Psych: Patient is not psychotic, no suicidal or hemocidal ideation.  Labs on Admission: I have personally reviewed following labs and imaging studies  CBC: Recent Labs  Lab 02/22/17 2200  WBC 7.4  NEUTROABS 3.4  HGB 13.2  HCT 40.4  MCV 94.6  PLT 448   Basic Metabolic Panel: Recent Labs  Lab 02/22/17 2200  NA 137  K 4.1  CL 105  CO2 22  GLUCOSE 83  BUN 22*  CREATININE 0.76  CALCIUM 10.0   GFR: Estimated  Creatinine Clearance: 31.1 mL/min (by C-G formula based on SCr of 0.76 mg/dL). Liver Function Tests: Recent Labs  Lab 02/22/17 2200  AST 31  ALT 20  ALKPHOS 62  BILITOT 0.7  PROT 7.4  ALBUMIN 3.7   No results for input(s): LIPASE, AMYLASE in the last 168 hours. No results for input(s): AMMONIA in the last 168 hours. Coagulation Profile: No  results for input(s): INR, PROTIME in the last 168 hours. Cardiac Enzymes: Recent Labs  Lab 02/22/17 2200  TROPONINI <0.03   BNP (last 3 results) No results for input(s): PROBNP in the last 8760 hours. HbA1C: Recent Labs    02/23/17 0503  HGBA1C 5.4   CBG: Recent Labs  Lab 02/22/17 2154  GLUCAP 72   Lipid Profile: Recent Labs    02/23/17 0503  CHOL 191  HDL 67  LDLCALC 109*  TRIG 77  CHOLHDL 2.9   Thyroid Function Tests: No results for input(s): TSH, T4TOTAL, FREET4, T3FREE, THYROIDAB in the last 72 hours. Anemia Panel: No results for input(s): VITAMINB12, FOLATE, FERRITIN, TIBC, IRON, RETICCTPCT in the last 72 hours. Urine analysis:    Component Value Date/Time   COLORURINE YELLOW 10/01/2014 1628   APPEARANCEUR CLEAR 10/01/2014 1628   LABSPEC 1.016 10/01/2014 1628   PHURINE 5.5 10/01/2014 1628   GLUCOSEU NEGATIVE 10/01/2014 1628   HGBUR NEGATIVE 10/01/2014 1628   BILIRUBINUR NEGATIVE 10/01/2014 1628   KETONESUR 40 (A) 10/01/2014 1628   PROTEINUR NEGATIVE 10/01/2014 1628   UROBILINOGEN 0.2 10/01/2014 1628   NITRITE NEGATIVE 10/01/2014 1628   LEUKOCYTESUR TRACE (A) 10/01/2014 1628   Sepsis Labs: @LABRCNTIP (procalcitonin:4,lacticidven:4) )No results found for this or any previous visit (from the past 240 hour(s)).   Radiological Exams on Admission: Ct Angio Head W Or Wo Contrast  Result Date: 02/23/2017 CLINICAL DATA:  Slurred speech and altered mental status. Resolution of symptoms at time of scan. Suspected TIA. EXAM: CT ANGIOGRAPHY HEAD AND NECK TECHNIQUE: Multidetector CT imaging of the head and neck was  performed using the standard protocol during bolus administration of intravenous contrast. Multiplanar CT image reconstructions and MIPs were obtained to evaluate the vascular anatomy. Carotid stenosis measurements (when applicable) are obtained utilizing NASCET criteria, using the distal internal carotid diameter as the denominator. CONTRAST:  48mL ISOVUE-370 IOPAMIDOL (ISOVUE-370) INJECTION 76% COMPARISON:  Head CT 02/22/2017 FINDINGS: CTA NECK FINDINGS Aortic arch: There is mild calcific atherosclerosis of the aortic arch. There is no aneurysm, dissection or hemodynamically significant stenosis of the visualized ascending aorta and aortic arch. Conventional 3 vessel aortic branching pattern. The visualized proximal subclavian arteries are normal. Right carotid system: The right common carotid origin is widely patent. There is no common carotid or internal carotid artery dissection or aneurysm. Atherosclerotic calcification at the carotid bifurcation without hemodynamically significant stenosis. Left carotid system: The left common carotid origin is widely patent. There is no common carotid or internal carotid artery dissection or aneurysm. Atherosclerotic calcification at the carotid bifurcation without hemodynamically significant stenosis. Vertebral arteries: The vertebral system is codominant. Both vertebral artery origins are normal. Both vertebral arteries are normal to their confluence with the basilar artery. Skeleton: C3-C5 fusion anteriorly and posteriorly on the right. Multilevel degenerative disc disease. No bony spinal canal stenosis. Other neck: The nasopharynx is clear. The oropharynx and hypopharynx are normal. The epiglottis is normal. The supraglottic larynx, glottis and subglottic larynx are normal. No retropharyngeal collection. The parapharyngeal spaces are preserved. The parotid and submandibular glands are normal. No sialolithiasis or salivary ductal dilatation. The thyroid gland is normal.  There is no cervical lymphadenopathy. Upper chest: No pneumothorax or pleural effusion. No nodules or masses. Review of the MIP images confirms the above findings CTA HEAD FINDINGS Anterior circulation: --Intracranial internal carotid arteries: There is atherosclerotic calcification of both internal carotid arteries at the skull base. This results in at least moderate narrowing of the right ICA terminus. No significant stenosis on  the left. --Anterior cerebral arteries: Normal. --Middle cerebral arteries: Normal. --Posterior communicating arteries: Absent bilaterally. Posterior circulation: --Posterior cerebral arteries: Normal. --Superior cerebellar arteries: Normal. --Basilar artery: Normal. --Anterior inferior cerebellar arteries: Normal on the right. Not clearly seen on the left. --Posterior inferior cerebellar arteries: Normal. Venous sinuses: As permitted by contrast timing, patent. Anatomic variants: None Delayed phase: No parenchymal contrast enhancement. Review of the MIP images confirms the above findings. IMPRESSION: 1. No emergent large vessel occlusion. 2. At least moderate narrowing of the terminal aspect of the intracranial right internal carotid artery secondary to the presence of atherosclerotic calcification. 3. No dissection, occlusion or hemodynamically significant stenosis of the cervical carotid or vertebral arteries. 4.  Aortic Atherosclerosis (ICD10-I70.0). Electronically Signed   By: Ulyses Jarred M.D.   On: 02/23/2017 02:43   Dg Chest 2 View  Result Date: 02/23/2017 CLINICAL DATA:  84 year old female with altered mental status EXAM: CHEST  2 VIEW COMPARISON:  Chest radiograph dated 11/13/2015 and chest CT dated 11/03/2014 FINDINGS: There is emphysematous changes of the lungs. Areas of nodularity in the right mid lung field likely corresponds to the nodular densities seen on the prior radiograph and CT. No new consolidative change. There is no pleural effusion or pneumothorax. Stable  top-normal cardiac size. Calcification of the mitral annulus. Atherosclerotic calcification of the aortic arch. Osteopenia with degenerative changes of the spine. No acute osseous pathology. IMPRESSION: 1. No acute cardiopulmonary process. 2. Emphysema. Areas of nodularity in the right mid lung field similar to prior radiograph. Electronically Signed   By: Anner Crete M.D.   On: 02/23/2017 02:39   Ct Head Wo Contrast  Result Date: 02/22/2017 CLINICAL DATA:  84 year old female with slurred speech.  TIA. EXAM: CT HEAD WITHOUT CONTRAST TECHNIQUE: Contiguous axial images were obtained from the base of the skull through the vertex without intravenous contrast. COMPARISON:  Head CT dated 07/03/2015 FINDINGS: Brain: There is moderate age-related atrophy and chronic microvascular ischemic changes. No acute intracranial hemorrhage. No mass effect or midline shift. No extra-axial fluid collection. Vascular: No hyperdense vessel or unexpected calcification. Skull: Normal. Negative for fracture or focal lesion. Sinuses/Orbits: There is opacification of the right sphenoid sinus. The remainder of the visualized paranasal sinuses and mastoid air cells are clear. No air-fluid level. Other: None IMPRESSION: 1. No acute intracranial hemorrhage. 2. Age-related atrophy and chronic microvascular ischemic changes. Electronically Signed   By: Anner Crete M.D.   On: 02/22/2017 23:36   Ct Angio Neck W Or Wo Contrast  Result Date: 02/23/2017 CLINICAL DATA:  Slurred speech and altered mental status. Resolution of symptoms at time of scan. Suspected TIA. EXAM: CT ANGIOGRAPHY HEAD AND NECK TECHNIQUE: Multidetector CT imaging of the head and neck was performed using the standard protocol during bolus administration of intravenous contrast. Multiplanar CT image reconstructions and MIPs were obtained to evaluate the vascular anatomy. Carotid stenosis measurements (when applicable) are obtained utilizing NASCET criteria, using the  distal internal carotid diameter as the denominator. CONTRAST:  47mL ISOVUE-370 IOPAMIDOL (ISOVUE-370) INJECTION 76% COMPARISON:  Head CT 02/22/2017 FINDINGS: CTA NECK FINDINGS Aortic arch: There is mild calcific atherosclerosis of the aortic arch. There is no aneurysm, dissection or hemodynamically significant stenosis of the visualized ascending aorta and aortic arch. Conventional 3 vessel aortic branching pattern. The visualized proximal subclavian arteries are normal. Right carotid system: The right common carotid origin is widely patent. There is no common carotid or internal carotid artery dissection or aneurysm. Atherosclerotic calcification at the carotid bifurcation without hemodynamically  significant stenosis. Left carotid system: The left common carotid origin is widely patent. There is no common carotid or internal carotid artery dissection or aneurysm. Atherosclerotic calcification at the carotid bifurcation without hemodynamically significant stenosis. Vertebral arteries: The vertebral system is codominant. Both vertebral artery origins are normal. Both vertebral arteries are normal to their confluence with the basilar artery. Skeleton: C3-C5 fusion anteriorly and posteriorly on the right. Multilevel degenerative disc disease. No bony spinal canal stenosis. Other neck: The nasopharynx is clear. The oropharynx and hypopharynx are normal. The epiglottis is normal. The supraglottic larynx, glottis and subglottic larynx are normal. No retropharyngeal collection. The parapharyngeal spaces are preserved. The parotid and submandibular glands are normal. No sialolithiasis or salivary ductal dilatation. The thyroid gland is normal. There is no cervical lymphadenopathy. Upper chest: No pneumothorax or pleural effusion. No nodules or masses. Review of the MIP images confirms the above findings CTA HEAD FINDINGS Anterior circulation: --Intracranial internal carotid arteries: There is atherosclerotic calcification  of both internal carotid arteries at the skull base. This results in at least moderate narrowing of the right ICA terminus. No significant stenosis on the left. --Anterior cerebral arteries: Normal. --Middle cerebral arteries: Normal. --Posterior communicating arteries: Absent bilaterally. Posterior circulation: --Posterior cerebral arteries: Normal. --Superior cerebellar arteries: Normal. --Basilar artery: Normal. --Anterior inferior cerebellar arteries: Normal on the right. Not clearly seen on the left. --Posterior inferior cerebellar arteries: Normal. Venous sinuses: As permitted by contrast timing, patent. Anatomic variants: None Delayed phase: No parenchymal contrast enhancement. Review of the MIP images confirms the above findings. IMPRESSION: 1. No emergent large vessel occlusion. 2. At least moderate narrowing of the terminal aspect of the intracranial right internal carotid artery secondary to the presence of atherosclerotic calcification. 3. No dissection, occlusion or hemodynamically significant stenosis of the cervical carotid or vertebral arteries. 4.  Aortic Atherosclerosis (ICD10-I70.0). Electronically Signed   By: Ulyses Jarred M.D.   On: 02/23/2017 02:43     EKG: Independently reviewed. Sinus rhythm, QTC 509, LAD, nonspecific T-wave change  Assessment/Plan Principal Problem:   Slurred speech Active Problems:   Rectal cancer, pT2uN0(pNX) s/p TEM partial proctectomy   CAD (coronary artery disease)   Hyperlipidemia   HTN (hypertension)   SOB (shortness of breath)   PAF (paroxysmal atrial fibrillation) ([place])   Depression with anxiety   Slurred speech: Patient had transient confusion and slurred speech. Etiology is not clear. A potential differential diagnosis is TIA. Patient had history of tympanoplasty, cannot to MRI.  - will place to tele bed - Risk factor modification: HgbA1c, fasting lipid panel - will get CTA of head and neck - PT consult, OT consult - Bedside swallowing  screen was ordered - 2 d Echocardiogram  - Ekg  - Aspirin - start lipitor - check UDS  PAF (paroxysmal atrial fibrillation) ([place]): CHA2DS2-VASc Score is 5, needs oral anticoagulation, but pt is not on AC at home, possibly due to old age and high risk of fall. Heart rate is well controlled. -continue ASA and metoprolol  Hx of CAD: no CP -on aspirin, metoprolol -When necessary nitroglycerin  HLD: not on meds -started Lipitor as above -f/u FLP  HTN:  -Continue home medications: Metoprolol -IV hydralazine prn  Depression and anxiety: Stable, no suicidal or homicidal ideations. -Continue home medications: Zoloft and Xanax  Rectal cancer, pT2uN0(pNX): s/p TEM partial proctectomy:  -no acute issues  SOB and dry cough: Chest x-ray showed have emphysema. -prn Mucinex and albuterol nebulizers   DVT ppx: sQ Lovenox Code Status: DNR (  I discussed with patient in the presence of her niece, and explained the meaning of CODE STATUS. Patient wants to be DNR). Family Communication:  Yes, patient's niece  at bed side Disposition Plan:  Anticipate discharge back to previous home environment Consults called:  none Admission status: Obs / tele    Date of Service 02/23/2017    Ivor Costa [facility] Pager [phone]  If 7PM-7AM, please contact night-coverage [email or web address] Password [facility] 02/23/2017, 6:57 AM

## 2017-02-22 NOTE — ED Triage Notes (Signed)
Per EMS, pt from home, family called EMS, while on the phone with pt, she began to have slurred speech and confusion. When EMS arrived, symptoms resolved. No neuro deficits noted at this time. A&O x 4. Pt HOH.

## 2017-02-22 NOTE — ED Provider Notes (Signed)
[facility] EMERGENCY DEPARTMENT Provider Note   CSN: [id number] Arrival date & time: 02/22/17  2145     History   Chief Complaint Chief Complaint  Patient presents with  . Altered Mental Status    HPI [name] is a 84 y.o. female.  HPI   84 year old female with hx of CAD, PAF, HTN, brought here via EMS from home for evaluation of a likely TIA episode.  Patient was on the phone with her family member at approximately 10:30 PM when a family member noticed that patient was slurring his speech and not making sense.  Then required to call EMS.  She was also talking to her niece afterward and her niece called EMS as well as arriving to her place.  Please mention that patient was slurring her speech with which new.  However once these arrive at the house, her speech has since improved and his symptom has resolved.  Patient did report feeling a bit "off" while she was on the phone but denies any associated headache, vision changes, chest pain, trouble breathing, abdominal pain, focal numbness or weakness or increased confusion.  [place] earlier [place] but denies feeling depressed.  She did report feeling fatigue since Thanksgiving but without any further complaints.  Her diet has been the same.  She denies alcohol or drug use.  No prior history of stroke.  Patient does take a baby aspirin daily and took her dose this morning.    Past Medical History:  Diagnosis Date  . Arthritis    FINGERS   . CAD (coronary artery disease) CARDIOLOGIST-- DR Angelena Form   a. s/p INF STEMI 7/12: tx with BMS to RCA;  b. cath 08/26/10: pLAD 30%, mLAD 50%, D1 40%, pCFX 95%, mRCA occluded;   c. staged PCI of pCFX with BMS;   d. echo 7/12:   EF 60-65%, mild RAE, mild to moderate AI, mild MR, moderate TR, RVE, PASP 47  . Complication of anesthesia PT STATES "MADE HER FEEL CRAZY"  . Degeneration of eye    left eye cornea  . Dyspnea   . First degree heart block   . Heart  palpitations PAC'S AND SVT RUN'S  PER CARDIOLOGIST NOTE  . History of ST elevation myocardial infarction (STEMI) 08-26-2010-- INFERIOR WALL   S/P PCI  BMS IN RCA AND PROX. CX  . Hyperlipidemia   . Hypertension   . Impaired hearing BILATERAL HEARING AIDS  . Osteopenia   . PAF (paroxysmal atrial fibrillation) ([place])   . PONV (postoperative nausea and vomiting)   . Pulmonary nodules BENIGN  PER CT  10-12-2010  . Rectal Cancer 08/2010   adenocarcinoma   S/P PARTIAL PROCTECTOMY (NO CHEMO OR RADIATION)  . 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 S/P coronary artery stent placement 08/2010   X2  BM    Patient Active Problem List   Diagnosis Date Noted  . Fracture, tibia and fibula 07/06/2015  . Tibia/fibula fracture 07/03/2015  . Lung nodule seen on imaging study 09/05/2013  . Exposure to TB 09/05/2013  . Vascular skin changes 07/17/2011  . 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 Anorexia post-op 12/08/2010  . HTN (hypertension) 11/03/2010  . Dizziness 09/15/2010  . CAD (coronary artery disease)   . Hyperlipidemia   . Rectal cancer, pT2uN0(pNX) s/p TEM partial proctectomy 09/07/2010    Past  Surgical History:  Procedure Laterality Date  . ABDOMINAL HYSTERECTOMY  1950's   AND APPENDECTOMY  . CATARACT EXTRACTION W/ INTRAOCULAR LENS  IMPLANT, BILATERAL    . CORONARY ANGIOPLASTY WITH STENT PLACEMENT  08-26-2010  DR [place]   PCI, BM STENT IN RCA  . CORONARY ANGIOPLASTY WITH STENT PLACEMENT  08-29-2010  DR [place]   PCI, BM STENT IN PROXIMAL CIRCUMFLEX  . EXCISION BENIGN CYST RIGHT BREAST    . FISTULA PLUG N/A 06/21/2012   Procedure: insertion of FISTULA PLUG;  Surgeon: Leighton Ruff, MD;  Location: [facility];  Service: General;  Laterality: N/A;  . FLEXIBLE SIGMOIDOSCOPY N/A 04/02/2012   Procedure: FLEXIBLE SIGMOIDOSCOPY;  Surgeon: Leighton Ruff, MD;   Location: [facility];  Service: Endoscopy;  Laterality: N/A;  . KNEE SURGERY Right 1996  . LAPROSCOPY LYSIS ADHESIONS/ DRAINAGE OF PELVIC ABSCESS/ DIVERTING LOOP SIGMOID COLECTOMY  11-22-2010   POST OP RECTOVAGINAL FISTULA  . PARTIAL PROCTECTOMY BY TEM  11-17-2010   RECTAL CANCER  . RELEASE LEFT CARPAL TUNNEL/ OSTEOTOMY LEFT DISTAL RADIUS  11-10-2009  . STAPEDECTOMY  1970'S  . TONSILLECTOMY  CHILD  . TOTAL HIP ARTHROPLASTY Right 1992  . TRANSTHORACIC ECHOCARDIOGRAM  10-10-2011   NORMAL LV SIZE WITH MILD FOCAL BASAL SEPTAL HYPERTROPHY/ EF 55-60%/ NORMAL RV SIZE AND LVSF/ BIATRIAL ENLARGEMENT/ MILD TO MODERATE AI  &  TR  . TYMPANOPLASTY Left 12-23-2009    OB History    No data available       Home Medications    Prior to Admission medications   Medication Sig Start Date End Date Taking? Authorizing Provider  acetaminophen (TYLENOL) 500 MG tablet Take 2 tablets (1,000 mg total) by mouth every 6 (six) hours as needed for mild pain. 07/06/15   Rama, Venetia Maxon, MD  ALPRAZolam Duanne Moron) 0.25 MG tablet Take 0.125 mg by mouth 2 (two) times daily.    [provider]  aspirin EC 81 MG tablet Take 81 mg by mouth daily.    [provider]  aspirin-acetaminophen-caffeine (EXCEDRIN MIGRAINE) [id number] MG per tablet Take 1 tablet by mouth every 6 (six) hours as needed for headache.    [provider]  beta carotene w/minerals (OCUVITE) tablet Take 1 tablet by mouth daily.    [provider]  BIOTIN PO Take 2 tablets by mouth daily.    [provider]  cholecalciferol (VITAMIN D) 1000 units tablet Take 1,000 Units by mouth daily.    [provider]  feeding supplement, ENSURE ENLIVE, (ENSURE ENLIVE) LIQD Take 237 mLs by mouth daily at 3 pm. 07/06/15   Rama, Venetia Maxon, MD  metoprolol tartrate (LOPRESSOR) 25 MG tablet TAKE ONE TABLET BY MOUTH TWICE DAILY 03/09/16   Burnell Blanks, MD  Multiple Vitamin (MULTIVITAMIN) tablet Take 1 tablet  by mouth daily.     [provider]  neomycin-bacitracin-polymyxin (NEOSPORIN) OINT Apply 1 application topically as needed for wound care. 07/06/15   Rama, Venetia Maxon, MD  nitroGLYCERIN (NITROSTAT) 0.4 MG SL tablet Place 1 tablet (0.4 mg total) under the tongue every 5 (five) minutes as needed for chest pain (MAX 3 TABLETS). 10/31/13   Burnell Blanks, MD  Polyethyl Glycol-Propyl Glycol (SYSTANE) 0.4-0.3 % GEL Apply 1 drop to eye at bedtime.     [provider]  sertraline (ZOLOFT) 25 MG tablet Take 25 mg by mouth at bedtime. 1/2 tablet daily at bedtime 04/21/16   [provider]  traMADol (ULTRAM) 50 MG tablet Take 1 tablet (50  mg total) by mouth every 6 (six) hours as needed for severe pain. 07/06/15   Rama, Venetia Maxon, MD    Family History Family History  Problem Relation Age of Onset  . Kidney disease Mother   . Heart disease Father   . Cancer Brother     Social History Social History   Tobacco Use  . Smoking status: Never Smoker  . Smokeless tobacco: Never Used  Substance Use Topics  . Alcohol use: No  . Drug use: No     Allergies   Effexor [venlafaxine hydrochloride]; Penicillins; Sulfa antibiotics; Dicyclomine; and Hydrocodone   Review of Systems Review of Systems  All other systems reviewed and are negative.    Physical Exam Updated Vital Signs BP (!) 177/68   Pulse 77   Temp 97.7 F (36.5 C) (Oral)   Resp (!) 25   SpO2 100%   Physical Exam  Constitutional: She is oriented to person, place, and time. She appears well-developed and well-nourished. No distress.  Elderly female resting comfortably in bed in no acute discomfort.  HENT:  Head: Atraumatic.  Eyes: Conjunctivae and EOM are normal. Pupils are equal, round, and reactive to light.  Neck: Normal range of motion. Neck supple.  Cardiovascular: Normal rate and regular rhythm.  Pulmonary/Chest: Effort normal and breath sounds normal.  Abdominal: Soft. There is no  tenderness.  Neurological: She is alert and oriented to person, place, and time.  Neurologic exam:  Speech clear, pupils equal round reactive to light, extraocular movements intact  Normal peripheral visual fields Cranial nerves III through XII normal with mild R facial droop (not new) Follows commands, moves all extremities x4, normal strength to bilateral upper and lower extremities at all major muscle groups including grip Sensation normal to light touch and pinprick Coordination intact, no limb ataxia, finger-nose-finger normal Rapid alternating movements normal No pronator drift Gait not tested   Skin: No rash noted.  Psychiatric: She has a normal mood and affect.  Nursing note and vitals reviewed.    ED Treatments / Results  Labs (all labs ordered are listed, but only abnormal results are displayed) Labs Reviewed  COMPREHENSIVE METABOLIC PANEL - Abnormal; Notable for the following components:      Result Value   BUN 22 (*)    All other components within normal limits  TROPONIN I  CBC WITH DIFFERENTIAL/PLATELET  RAPID URINE DRUG SCREEN, HOSP PERFORMED  URINALYSIS, ROUTINE W REFLEX MICROSCOPIC  CBG MONITORING, ED    EKG  EKG Interpretation None     ED ECG REPORT   Date: 02/22/2017  Rate: 77  Rhythm: normal sinus rhythm  QRS Axis: left  Intervals: QT prolonged  ST/T Wave abnormalities: anterior Q waves, possibly due to LVH  Conduction Disutrbances:none  Narrative Interpretation:   Old EKG Reviewed: unchanged  I have personally reviewed the EKG tracing and agree with the computerized printout as noted.   Radiology Ct Head Wo Contrast  Result Date: 02/22/2017 CLINICAL DATA:  84 year old female with slurred speech.  TIA. EXAM: CT HEAD WITHOUT CONTRAST TECHNIQUE: Contiguous axial images were obtained from the base of the skull through the vertex without intravenous contrast. COMPARISON:  Head CT dated 07/03/2015 FINDINGS: Brain: There is moderate age-related  atrophy and chronic microvascular ischemic changes. No acute intracranial hemorrhage. No mass effect or midline shift. No extra-axial fluid collection. Vascular: No hyperdense vessel or unexpected calcification. Skull: Normal. Negative for fracture or focal lesion. Sinuses/Orbits: There is opacification of the right sphenoid sinus. The remainder  of the visualized paranasal sinuses and mastoid air cells are clear. No air-fluid level. Other: None IMPRESSION: 1. No acute intracranial hemorrhage. 2. Age-related atrophy and chronic microvascular ischemic changes. Electronically Signed   By: Anner Crete M.D.   On: 02/22/2017 23:36    Procedures Procedures (including critical care time)  Medications Ordered in ED Medications  morphine 4 MG/ML injection 4 mg (not administered)  aspirin tablet 325 mg (325 mg Oral Given 02/22/17 2348)  promethazine (PHENERGAN) injection 12.5 mg (12.5 mg Intravenous Given 02/22/17 2341)     Initial Impression / Assessment and Plan / ED Course  I have reviewed the triage vital signs and the nursing notes.  Pertinent labs & imaging results that were available during my care of the patient were reviewed by me and considered in my medical decision making (see chart for details).     BP (!) 170/97   Pulse 76   Temp (!) 97.5 F (36.4 C)   Resp (!) 22   SpO2 97%    Final Clinical Impressions(s) / ED Diagnoses   Final diagnoses:  TIA (transient ischemic attack)    ED Discharge Orders    None     10:23 PM Patient was found to slurred speech and had a brief episode of confusion approximately 2 hours ago.  Symptom was short lasting and she is now at baseline.  She does exhibit some mild right-sided facial droop but family member at bedside states that that is not new.  No headache and no other complaint.  Workup initiated.  ABCD2 score of 4, which placed her in moderate risk for stroke.  ASA given.  Care discussed with Dr. Lacinda Axon.    11:46 PM Head CT scan  without acute abnormalities.  Age-related atrophy and chronic microvascular ischemic changes noted.  Will consult for admission for TIA workup.  11:42 PM UA have not resulted yet.  Pt now report having mild frontal headache. Pain medication given.  EKG with prolonged QT, will need to avoid medication that can worsening prolonged QT.  Appreciate consultation from [facility] Dr. Blaine Hamper who agrees to see pt and will admit for TIA work up.     Domenic Moras, PA-C 02/23/17 0007    Nat Christen, MD 02/23/17 [phone]

## 2017-02-22 NOTE — ED Notes (Signed)
Per CT, pt feeling nauseous after scan. Gertie Fey PA notified

## 2017-02-23 ENCOUNTER — Observation Stay (HOSPITAL_COMMUNITY): Payer: Medicare Other

## 2017-02-23 ENCOUNTER — Encounter (HOSPITAL_COMMUNITY): Payer: Self-pay

## 2017-02-23 ENCOUNTER — Other Ambulatory Visit: Payer: Self-pay

## 2017-02-23 ENCOUNTER — Observation Stay (HOSPITAL_BASED_OUTPATIENT_CLINIC_OR_DEPARTMENT_OTHER): Payer: Medicare Other

## 2017-02-23 DIAGNOSIS — R4781 Slurred speech: Secondary | ICD-10-CM | POA: Diagnosis present

## 2017-02-23 DIAGNOSIS — I480 Paroxysmal atrial fibrillation: Secondary | ICD-10-CM | POA: Diagnosis not present

## 2017-02-23 DIAGNOSIS — F418 Other specified anxiety disorders: Secondary | ICD-10-CM | POA: Diagnosis present

## 2017-02-23 DIAGNOSIS — I351 Nonrheumatic aortic (valve) insufficiency: Secondary | ICD-10-CM | POA: Diagnosis not present

## 2017-02-23 DIAGNOSIS — I2583 Coronary atherosclerosis due to lipid rich plaque: Secondary | ICD-10-CM

## 2017-02-23 DIAGNOSIS — I10 Essential (primary) hypertension: Secondary | ICD-10-CM | POA: Diagnosis not present

## 2017-02-23 DIAGNOSIS — I2510 Atherosclerotic heart disease of native coronary artery without angina pectoris: Secondary | ICD-10-CM | POA: Diagnosis not present

## 2017-02-23 DIAGNOSIS — E785 Hyperlipidemia, unspecified: Secondary | ICD-10-CM | POA: Diagnosis not present

## 2017-02-23 DIAGNOSIS — C20 Malignant neoplasm of rectum: Secondary | ICD-10-CM

## 2017-02-23 DIAGNOSIS — G459 Transient cerebral ischemic attack, unspecified: Secondary | ICD-10-CM | POA: Insufficient documentation

## 2017-02-23 LAB — HEMOGLOBIN A1C
Hgb A1c MFr Bld: 5.4 % (ref 4.8–5.6)
MEAN PLASMA GLUCOSE: 108.28 mg/dL

## 2017-02-23 LAB — LIPID PANEL
CHOL/HDL RATIO: 2.9 ratio
CHOLESTEROL: 191 mg/dL (ref 0–200)
HDL: 67 mg/dL (ref >40)
LDL Cholesterol: 109 mg/dL — ABNORMAL HIGH (ref 0–99)
TRIGLYCERIDES: 77 mg/dL (ref <150)
VLDL: 15 mg/dL (ref 0–40)

## 2017-02-23 LAB — ECHOCARDIOGRAM COMPLETE
Height: 63 in
Weight: 1767.21 oz

## 2017-02-23 LAB — BRAIN NATRIURETIC PEPTIDE: B NATRIURETIC PEPTIDE 5: 193.9 pg/mL — AB (ref 0.0–100.0)

## 2017-02-23 MED ORDER — ZOLPIDEM TARTRATE 5 MG PO TABS: 5.0000 mg | ORAL_TABLET | Freq: Every evening | ORAL | Status: DC | PRN

## 2017-02-23 MED ORDER — ASPIRIN 325 MG PO TABS
325.0000 mg | ORAL_TABLET | Freq: Every day | ORAL | Status: DC
  Administered 2017-02-23 – 2017-02-24 (×2): 325 mg via ORAL
  Filled 2017-02-23 (×2): qty 1

## 2017-02-23 MED ORDER — ASPIRIN 300 MG RE SUPP: 300.0000 mg | Freq: Every day | RECTAL | Status: DC

## 2017-02-23 MED ORDER — ENSURE ENLIVE PO LIQD
237.0000 mL | Freq: Every day | ORAL | Status: DC
  Administered 2017-02-23: 237 mL via ORAL
  Filled 2017-02-23: qty 237

## 2017-02-23 MED ORDER — ALPRAZOLAM 0.25 MG PO TABS
0.1250 mg | ORAL_TABLET | Freq: Two times a day (BID) | ORAL | Status: DC
  Filled 2017-02-23 (×2): qty 1

## 2017-02-23 MED ORDER — ASPIRIN EC 81 MG PO TBEC: 81.0000 mg | DELAYED_RELEASE_TABLET | Freq: Every day | ORAL | Status: DC

## 2017-02-23 MED ORDER — ACETAMINOPHEN 500 MG PO TABS: 500.0000 mg | ORAL_TABLET | Freq: Four times a day (QID) | ORAL | Status: DC | PRN

## 2017-02-23 MED ORDER — METOPROLOL TARTRATE 25 MG PO TABS
25.0000 mg | ORAL_TABLET | Freq: Two times a day (BID) | ORAL | Status: DC
  Administered 2017-02-23 – 2017-02-24 (×3): 25 mg via ORAL
  Filled 2017-02-23 (×3): qty 1

## 2017-02-23 MED ORDER — VITAMIN D 1000 UNITS PO TABS
1000.0000 [IU] | ORAL_TABLET | Freq: Every day | ORAL | Status: DC
  Administered 2017-02-23 – 2017-02-24 (×2): 1000 [IU] via ORAL
  Filled 2017-02-23 (×2): qty 1

## 2017-02-23 MED ORDER — ALBUTEROL SULFATE (2.5 MG/3ML) 0.083% IN NEBU: 2.5000 mg | INHALATION_SOLUTION | RESPIRATORY_TRACT | Status: DC | PRN

## 2017-02-23 MED ORDER — ADULT MULTIVITAMIN W/MINERALS CH
1.0000 | ORAL_TABLET | Freq: Every day | ORAL | Status: DC
  Administered 2017-02-23 – 2017-02-24 (×2): 1 via ORAL
  Filled 2017-02-23 (×2): qty 1

## 2017-02-23 MED ORDER — ENOXAPARIN SODIUM 40 MG/0.4ML ~~LOC~~ SOLN: 40.0000 mg | SUBCUTANEOUS | Status: DC

## 2017-02-23 MED ORDER — ENOXAPARIN SODIUM 30 MG/0.3ML ~~LOC~~ SOLN
30.0000 mg | SUBCUTANEOUS | Status: DC
  Administered 2017-02-23: 30 mg via SUBCUTANEOUS
  Filled 2017-02-23: qty 0.3

## 2017-02-23 MED ORDER — STROKE: EARLY STAGES OF RECOVERY BOOK
Freq: Once | Status: AC
  Administered 2017-02-23: 05:00:00
  Filled 2017-02-23: qty 1

## 2017-02-23 MED ORDER — SERTRALINE HCL 50 MG PO TABS
25.0000 mg | ORAL_TABLET | Freq: Every day | ORAL | Status: DC
Start: 2017-02-24 — End: 2017-02-24

## 2017-02-23 MED ORDER — ASPIRIN-ACETAMINOPHEN-CAFFEINE 250-250-65 MG PO TABS: 1.0000 | ORAL_TABLET | Freq: Four times a day (QID) | ORAL | Status: DC | PRN

## 2017-02-23 MED ORDER — DM-GUAIFENESIN ER 30-600 MG PO TB12: 1.0000 | ORAL_TABLET | Freq: Two times a day (BID) | ORAL | Status: DC | PRN

## 2017-02-23 MED ORDER — ATORVASTATIN CALCIUM 40 MG PO TABS
40.0000 mg | ORAL_TABLET | Freq: Every day | ORAL | Status: DC
  Administered 2017-02-23: 40 mg via ORAL
  Filled 2017-02-23: qty 1

## 2017-02-23 MED ORDER — POLYVINYL ALCOHOL 1.4 % OP SOLN
Freq: Every day | OPHTHALMIC | Status: DC
  Administered 2017-02-23: 23:00:00 via OPHTHALMIC
  Filled 2017-02-23: qty 15

## 2017-02-23 MED ORDER — SENNOSIDES-DOCUSATE SODIUM 8.6-50 MG PO TABS: 1.0000 | ORAL_TABLET | Freq: Every evening | ORAL | Status: DC | PRN

## 2017-02-23 MED ORDER — HYDROXYZINE HCL 10 MG PO TABS: 10.0000 mg | ORAL_TABLET | Freq: Three times a day (TID) | ORAL | Status: DC | PRN

## 2017-02-23 MED ORDER — NITROGLYCERIN 0.4 MG SL SUBL: 0.4000 mg | SUBLINGUAL_TABLET | SUBLINGUAL | Status: DC | PRN

## 2017-02-23 MED ORDER — MORPHINE SULFATE (PF) 4 MG/ML IV SOLN
4.0000 mg | Freq: Once | INTRAVENOUS | Status: AC
  Administered 2017-02-23: 4 mg via INTRAVENOUS
  Filled 2017-02-23: qty 1

## 2017-02-23 MED ORDER — IOPAMIDOL (ISOVUE-370) INJECTION 76%
INTRAVENOUS | Status: AC
  Administered 2017-02-23: 50 mL
  Filled 2017-02-23: qty 50

## 2017-02-23 MED ORDER — HYDRALAZINE HCL 20 MG/ML IJ SOLN: 5.0000 mg | INTRAMUSCULAR | Status: DC | PRN

## 2017-02-23 NOTE — Evaluation (Addendum)
Physical Therapy Evaluation Patient Details Name: [name] MRN: [id number] DOB: Oct 14, 1916 Today's Date: 02/23/2017   History of Present Illness  84 y.o. female with medical history significant of PAF not on anticoagulants, hypertension, hyperlipidemia, depression and anxiety, CAD, STEMI, stent placement, rectal cancer (s/p of partial proctectomy), right ear deaf, s/p of tympanoplasty, who presents with slurred speech and confusion. CT revealed No acute intracranial hemorrhage.   Clinical Impression  Patient evaluated by Physical Therapy with no further acute PT needs identified. Pt is mod I for bed mobility and transfers and supervision for ambulation of 250 feet and ascent/descent of 5 steps. All BE SAFE stroke symptomology education has been completed and the patient has no further questions. Pt is close to baseline level of mobility but reports she is feeling fatigued due to not eating since yesterday. PT recommending RW usage at home and 24 hr supervision initially until she is feeling less fatigued. PT is signing off. Thank you for this referral.     Follow Up Recommendations No PT follow up;Supervision/Assistance - 24 hour    Equipment Recommendations  None recommended by PT    Recommendations for Other Services       Precautions / Restrictions Precautions Precautions: Fall Restrictions Weight Bearing Restrictions: No      Mobility  Bed Mobility Overal bed mobility: Modified Independent             General bed mobility comments: HOB elevated  Transfers Overall transfer level: Modified independent Equipment used: Rolling walker (2 wheeled)             General transfer comment: good power up and steadying, initial dizziness which resolved within 10 sec  Ambulation/Gait Ambulation/Gait assistance: Supervision Ambulation Distance (Feet): 250 Feet Assistive device: Rolling walker (2 wheeled) Gait Pattern/deviations: Step-through pattern;Shuffle;Trunk  flexed Gait velocity: slowed Gait velocity interpretation: at or above normal speed for age/gender General Gait Details: supervision for safety, mild instability but no LoB, vc for upright posture, decreased foot clearance with increased fatigue  Stairs Stairs: Yes Stairs assistance: Supervision Stair Management: Two rails;Alternating pattern;Forwards Number of Stairs: 5 General stair comments: steady, ascent/descent    Modified Rankin (Stroke Patients Only) Modified Rankin (Stroke Patients Only) Pre-Morbid Rankin Score: No symptoms Modified Rankin: No symptoms     Balance Overall balance assessment: No apparent balance deficits (not formally assessed)                                           Pertinent Vitals/Pain Pain Assessment: No/denies pain    Home Living Family/patient expects to be discharged to:: Private residence Living Arrangements: Alone Available Help at Discharge: Family;Available 24 hours/day Type of Home: House Home Access: Ramped entrance     Home Layout: One level Home Equipment: Walker - 2 wheels;Cane - single point;Grab bars - tub/shower;Hand held shower head;Shower seat - built in      Prior Function Level of Independence: Independent with assistive device(s)         Comments: uses cane for community ambulation, drives and cooks for the elderly     Hand Dominance   Dominant Hand: Right    Extremity/Trunk Assessment   Upper Extremity Assessment Upper Extremity Assessment: Overall WFL for tasks assessed    Lower Extremity Assessment Lower Extremity Assessment: RLE deficits/detail;LLE deficits/detail RLE Deficits / Details: strength grossly 4+/5, ROM WFL RLE Sensation: (intact) RLE Coordination: (WFL) LLE  Deficits / Details: strength grossly 4+/5, ROM WFL LLE Sensation: (intact) LLE Coordination: (WFL)    Cervical / Trunk Assessment Cervical / Trunk Assessment: Kyphotic  Communication   Communication:  HOH;Deaf(R ear)  Cognition Arousal/Alertness: Awake/alert Behavior During Therapy: WFL for tasks assessed/performed Overall Cognitive Status: Within Functional Limits for tasks assessed                                        General Comments General comments (skin integrity, edema, etc.): Niece present during session, reports 24 hour supervision will be available when she first gets home         Assessment/Plan    PT Assessment Patent does not need any further PT services  PT Problem List         PT Treatment Interventions      PT Goals (Current goals can be found in the Care Plan section)  Acute Rehab PT Goals Patient Stated Goal: go home PT Goal Formulation: With patient     AM-PAC PT "6 Clicks" Daily Activity  Outcome Measure Difficulty turning over in bed (including adjusting bedclothes, sheets and blankets)?: A Little Difficulty moving from lying on back to sitting on the side of the bed? : A Little Difficulty sitting down on and standing up from a chair with arms (e.g., wheelchair, bedside commode, etc,.)?: A Little Help needed moving to and from a bed to chair (including a wheelchair)?: None Help needed walking in hospital room?: None Help needed climbing 3-5 steps with a railing? : A Little 6 Click Score: 20    End of Session Equipment Utilized During Treatment: Gait belt Activity Tolerance: Patient tolerated treatment well Patient left: in chair;with call bell/phone within reach;with chair alarm set;with family/visitor present Nurse Communication: Mobility status PT Visit Diagnosis: Other symptoms and signs involving the nervous system (R29.898)    Time: 8295-6213 PT Time Calculation (min) (ACUTE ONLY): 34 min   Charges:   PT Evaluation $PT Eval Moderate Complexity: 1 Mod PT Treatments $Gait Training: 8-22 mins   PT G Codes:        [name] PT, DPT Acute Rehabilitation  [phone] Pager [phone]    [place] 02/23/2017, 10:32 AM

## 2017-02-23 NOTE — ED Notes (Signed)
[place] PA notified of pt BP

## 2017-02-23 NOTE — ED Notes (Signed)
Patient denies pain and is resting comfortably.  

## 2017-02-23 NOTE — Progress Notes (Signed)
Pt refused xanax, stated that she quit taking it one year ago. 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

## 2017-02-23 NOTE — Progress Notes (Signed)
  Echocardiogram 2D Echocardiogram has been performed.  [name] 02/23/2017, 3:34 PM

## 2017-02-23 NOTE — Progress Notes (Signed)
PROGRESS NOTE  [name] ZGY:[id number] DOB: 04-Jun-1916 DOA: 02/22/2017 PCP: Leonard Downing, MD  HPI/Recap of past 24 hours: HPI from Dr Ivor Costa on 02/22/17 [name] is a 84 y.o. female with medical history significant of PAF not on anticoagulants, hypertension, hyperlipidemia, depression and anxiety, CAD, STEMI, stent placement, rectal cancer (s/p of partial proctectomy), right ear deaf, s/p tympanoplasty, who presents with slurred speech and confusion for about 30 mins, then resolved spontaneously. A mild right facial droop was also noted, but as per family, its her baseline. No other focal neurologic symptoms was noted. In the ED, CT head was negative for acute intracranial abnormalities. Patient is placed on telemetry for observation for possible TIA.  Today, met pt sleeping, but easily arousable. Denied any chest pain, SOB, fever/chills, N/V/D/C, no focal neurologic deficit noted.   Assessment/Plan: Principal Problem:   Slurred speech Active Problems:   Rectal cancer, pT2uN0(pNX) s/p TEM partial proctectomy   CAD (coronary artery disease)   Hyperlipidemia   HTN (hypertension)   SOB (shortness of breath)   PAF (paroxysmal atrial fibrillation) (HCC)   Depression with anxiety   Likely TIA Slurred speech, transient confusion, resolved within 30 mins Currently no focal neurologic deficit CT head: No acute intracranial abnormality CT angio head/neck: No emergent large vessel occlusion. At least moderate narrowing of the terminal aspect of the intracranial right internal carotid artery secondary to the presence of atherosclerotic calcification Patient has a history of tympanoplasty, cannot perform MRI ECHO pending Hgb A1c: 5.4, LDL 109 PT/OT/SLP on board  Continue ASA 325 mg, start lipitor 40 mg   PAF (paroxysmal atrial fibrillation) Rate controlled CHA2DS2-VASc Score is 5, needs oral anticoagulation, but pt is not on AC at home, likely due to old age and  high risk of fall -continue ASA and metoprolol  Hx of CAD: Chest pain free -on aspirin, metoprolol, lipitor  HLD LDL 109 Started Lipitor  HTN  Continue home medications: Metoprolol  Depression and anxiety Stable, no suicidal or homicidal ideations. Continue home medications: Zoloft and Xanax  Rectal cancer, pT2uN0(pNX):  s/p TEM partial proctectomy -no acute issues   Code Status: DNR  Family Communication: Spoke with patients niece  Disposition Plan: Home once work up complete   Consultants:  None  Procedures:  None   Antimicrobials:  None  DVT prophylaxis:  SCDs   Objective: Vitals:   02/23/17 0542 02/23/17 0755 02/23/17 0935 02/23/17 1155  BP: (!) 130/48 (!) 124/52 (!) 114/50 (!) 139/48  Pulse: 65 70 66 75  Resp:  _0 Temp: 98.2 F (36.8 C) 98 F (36.7 C) 98 F (36.7 C) 98.1 F (36.7 C)  TempSrc: Oral Oral Oral Axillary  SpO2: 97% 98% 98% 99%  Weight: 50.1 kg (110 lb 7.2 oz)     Height: 5' 3" (1.6 m)      No intake or output data in the 24 hours ending 02/23/17 1541 Filed Weights   02/23/17 0542  Weight: 50.1 kg (110 lb 7.2 oz)    Exam:   General:  Alert, awake, oriented, NAD  Cardiovascular: S1, S2 present, no added hrt sound  Respiratory: Chest clear bilaterally   Abdomen: Soft, non-tender, non-distended, BS present  Musculoskeletal: No pedal edema  Skin: Normal  Psychiatry: Normal mood  Neuro: Strength 5/5 in all extremities, no focal neurologic deficit   Data Reviewed: CBC: Recent Labs  Lab 02/22/17 2200  WBC 7.4  NEUTROABS 3.4  HGB 13.2  HCT 40.4  MCV 94.6  PLT 245   Basic Metabolic Panel: Recent Labs  Lab 02/22/17 2200  NA 137  K 4.1  CL 105  CO2 22  GLUCOSE 83  BUN 22*  CREATININE 0.76  CALCIUM 10.0   GFR: Estimated Creatinine Clearance: 31.1 mL/min (by C-G formula based on SCr of 0.76 mg/dL). Liver Function Tests: Recent Labs  Lab 02/22/17 2200  AST 31  ALT 20  ALKPHOS 62    BILITOT 0.7  PROT 7.4  ALBUMIN 3.7   No results for input(s): LIPASE, AMYLASE in the last 168 hours. No results for input(s): AMMONIA in the last 168 hours. Coagulation Profile: No results for input(s): INR, PROTIME in the last 168 hours. Cardiac Enzymes: Recent Labs  Lab 02/22/17 2200  TROPONINI <0.03   BNP (last 3 results) No results for input(s): PROBNP in the last 8760 hours. HbA1C: Recent Labs    02/23/17 0503  HGBA1C 5.4   CBG: Recent Labs  Lab 02/22/17 2154  GLUCAP 72   Lipid Profile: Recent Labs    02/23/17 0503  CHOL 191  HDL 67  LDLCALC 109*  TRIG 77  CHOLHDL 2.9   Thyroid Function Tests: No results for input(s): TSH, T4TOTAL, FREET4, T3FREE, THYROIDAB in the last 72 hours. Anemia Panel: No results for input(s): VITAMINB12, FOLATE, FERRITIN, TIBC, IRON, RETICCTPCT in the last 72 hours. Urine analysis:    Component Value Date/Time   COLORURINE YELLOW 10/01/2014 1628   APPEARANCEUR CLEAR 10/01/2014 1628   LABSPEC 1.016 10/01/2014 1628   PHURINE 5.5 10/01/2014 1628   GLUCOSEU NEGATIVE 10/01/2014 1628   HGBUR NEGATIVE 10/01/2014 1628   BILIRUBINUR NEGATIVE 10/01/2014 1628   KETONESUR 40 (A) 10/01/2014 1628   PROTEINUR NEGATIVE 10/01/2014 1628   UROBILINOGEN 0.2 10/01/2014 1628   NITRITE NEGATIVE 10/01/2014 1628   LEUKOCYTESUR TRACE (A) 10/01/2014 1628   Sepsis Labs: _0 (procalcitonin:4,lacticidven:4)  )No results found for this or any previous visit (from the past 240 hour(s)).    Studies: Ct Angio Head W Or Wo Contrast  Result Date: 02/23/2017 CLINICAL DATA:  Slurred speech and altered mental status. Resolution of symptoms at time of scan. Suspected TIA. EXAM: CT ANGIOGRAPHY HEAD AND NECK TECHNIQUE: Multidetector CT imaging of the head and neck was performed using the standard protocol during bolus administration of intravenous contrast. Multiplanar CT image reconstructions and MIPs were obtained to evaluate the vascular anatomy.  Carotid stenosis measurements (when applicable) are obtained utilizing NASCET criteria, using the distal internal carotid diameter as the denominator. CONTRAST:  26m ISOVUE-370 IOPAMIDOL (ISOVUE-370) INJECTION 76% COMPARISON:  Head CT 02/22/2017 FINDINGS: CTA NECK FINDINGS Aortic arch: There is mild calcific atherosclerosis of the aortic arch. There is no aneurysm, dissection or hemodynamically significant stenosis of the visualized ascending aorta and aortic arch. Conventional 3 vessel aortic branching pattern. The visualized proximal subclavian arteries are normal. Right carotid system: The right common carotid origin is widely patent. There is no common carotid or internal carotid artery dissection or aneurysm. Atherosclerotic calcification at the carotid bifurcation without hemodynamically significant stenosis. Left carotid system: The left common carotid origin is widely patent. There is no common carotid or internal carotid artery dissection or aneurysm. Atherosclerotic calcification at the carotid bifurcation without hemodynamically significant stenosis. Vertebral arteries: The vertebral system is codominant. Both vertebral artery origins are normal. Both vertebral arteries are normal to their confluence with the basilar artery. Skeleton: C3-C5 fusion anteriorly and posteriorly on the right. Multilevel degenerative disc disease. No bony spinal canal stenosis. Other neck: The nasopharynx  is clear. The oropharynx and hypopharynx are normal. The epiglottis is normal. The supraglottic larynx, glottis and subglottic larynx are normal. No retropharyngeal collection. The parapharyngeal spaces are preserved. The parotid and submandibular glands are normal. No sialolithiasis or salivary ductal dilatation. The thyroid gland is normal. There is no cervical lymphadenopathy. Upper chest: No pneumothorax or pleural effusion. No nodules or masses. Review of the MIP images confirms the above findings CTA HEAD FINDINGS  Anterior circulation: --Intracranial internal carotid arteries: There is atherosclerotic calcification of both internal carotid arteries at the skull base. This results in at least moderate narrowing of the right ICA terminus. No significant stenosis on the left. --Anterior cerebral arteries: Normal. --Middle cerebral arteries: Normal. --Posterior communicating arteries: Absent bilaterally. Posterior circulation: --Posterior cerebral arteries: Normal. --Superior cerebellar arteries: Normal. --Basilar artery: Normal. --Anterior inferior cerebellar arteries: Normal on the right. Not clearly seen on the left. --Posterior inferior cerebellar arteries: Normal. Venous sinuses: As permitted by contrast timing, patent. Anatomic variants: None Delayed phase: No parenchymal contrast enhancement. Review of the MIP images confirms the above findings. IMPRESSION: 1. No emergent large vessel occlusion. 2. At least moderate narrowing of the terminal aspect of the intracranial right internal carotid artery secondary to the presence of atherosclerotic calcification. 3. No dissection, occlusion or hemodynamically significant stenosis of the cervical carotid or vertebral arteries. 4.  Aortic Atherosclerosis (ICD10-I70.0). Electronically Signed   By: Ulyses Jarred M.D.   On: 02/23/2017 02:43   Dg Chest 2 View  Result Date: 02/23/2017 CLINICAL DATA:  84 year old female with altered mental status EXAM: CHEST  2 VIEW COMPARISON:  Chest radiograph dated 11/13/2015 and chest CT dated 11/03/2014 FINDINGS: There is emphysematous changes of the lungs. Areas of nodularity in the right mid lung field likely corresponds to the nodular densities seen on the prior radiograph and CT. No new consolidative change. There is no pleural effusion or pneumothorax. Stable top-normal cardiac size. Calcification of the mitral annulus. Atherosclerotic calcification of the aortic arch. Osteopenia with degenerative changes of the spine. No acute osseous  pathology. IMPRESSION: 1. No acute cardiopulmonary process. 2. Emphysema. Areas of nodularity in the right mid lung field similar to prior radiograph. Electronically Signed   By: Anner Crete M.D.   On: 02/23/2017 02:39   Ct Head Wo Contrast  Result Date: 02/22/2017 CLINICAL DATA:  84 year old female with slurred speech.  TIA. EXAM: CT HEAD WITHOUT CONTRAST TECHNIQUE: Contiguous axial images were obtained from the base of the skull through the vertex without intravenous contrast. COMPARISON:  Head CT dated 07/03/2015 FINDINGS: Brain: There is moderate age-related atrophy and chronic microvascular ischemic changes. No acute intracranial hemorrhage. No mass effect or midline shift. No extra-axial fluid collection. Vascular: No hyperdense vessel or unexpected calcification. Skull: Normal. Negative for fracture or focal lesion. Sinuses/Orbits: There is opacification of the right sphenoid sinus. The remainder of the visualized paranasal sinuses and mastoid air cells are clear. No air-fluid level. Other: None IMPRESSION: 1. No acute intracranial hemorrhage. 2. Age-related atrophy and chronic microvascular ischemic changes. Electronically Signed   By: Anner Crete M.D.   On: 02/22/2017 23:36   Ct Angio Neck W Or Wo Contrast  Result Date: 02/23/2017 CLINICAL DATA:  Slurred speech and altered mental status. Resolution of symptoms at time of scan. Suspected TIA. EXAM: CT ANGIOGRAPHY HEAD AND NECK TECHNIQUE: Multidetector CT imaging of the head and neck was performed using the standard protocol during bolus administration of intravenous contrast. Multiplanar CT image reconstructions and MIPs were obtained to evaluate the  vascular anatomy. Carotid stenosis measurements (when applicable) are obtained utilizing NASCET criteria, using the distal internal carotid diameter as the denominator. CONTRAST:  72m ISOVUE-370 IOPAMIDOL (ISOVUE-370) INJECTION 76% COMPARISON:  Head CT 02/22/2017 FINDINGS: CTA NECK FINDINGS  Aortic arch: There is mild calcific atherosclerosis of the aortic arch. There is no aneurysm, dissection or hemodynamically significant stenosis of the visualized ascending aorta and aortic arch. Conventional 3 vessel aortic branching pattern. The visualized proximal subclavian arteries are normal. Right carotid system: The right common carotid origin is widely patent. There is no common carotid or internal carotid artery dissection or aneurysm. Atherosclerotic calcification at the carotid bifurcation without hemodynamically significant stenosis. Left carotid system: The left common carotid origin is widely patent. There is no common carotid or internal carotid artery dissection or aneurysm. Atherosclerotic calcification at the carotid bifurcation without hemodynamically significant stenosis. Vertebral arteries: The vertebral system is codominant. Both vertebral artery origins are normal. Both vertebral arteries are normal to their confluence with the basilar artery. Skeleton: C3-C5 fusion anteriorly and posteriorly on the right. Multilevel degenerative disc disease. No bony spinal canal stenosis. Other neck: The nasopharynx is clear. The oropharynx and hypopharynx are normal. The epiglottis is normal. The supraglottic larynx, glottis and subglottic larynx are normal. No retropharyngeal collection. The parapharyngeal spaces are preserved. The parotid and submandibular glands are normal. No sialolithiasis or salivary ductal dilatation. The thyroid gland is normal. There is no cervical lymphadenopathy. Upper chest: No pneumothorax or pleural effusion. No nodules or masses. Review of the MIP images confirms the above findings CTA HEAD FINDINGS Anterior circulation: --Intracranial internal carotid arteries: There is atherosclerotic calcification of both internal carotid arteries at the skull base. This results in at least moderate narrowing of the right ICA terminus. No significant stenosis on the left. --Anterior  cerebral arteries: Normal. --Middle cerebral arteries: Normal. --Posterior communicating arteries: Absent bilaterally. Posterior circulation: --Posterior cerebral arteries: Normal. --Superior cerebellar arteries: Normal. --Basilar artery: Normal. --Anterior inferior cerebellar arteries: Normal on the right. Not clearly seen on the left. --Posterior inferior cerebellar arteries: Normal. Venous sinuses: As permitted by contrast timing, patent. Anatomic variants: None Delayed phase: No parenchymal contrast enhancement. Review of the MIP images confirms the above findings. IMPRESSION: 1. No emergent large vessel occlusion. 2. At least moderate narrowing of the terminal aspect of the intracranial right internal carotid artery secondary to the presence of atherosclerotic calcification. 3. No dissection, occlusion or hemodynamically significant stenosis of the cervical carotid or vertebral arteries. 4.  Aortic Atherosclerosis (ICD10-I70.0). Electronically Signed   By: KUlyses JarredM.D.   On: 02/23/2017 02:43    Scheduled Meds: . ALPRAZolam  0.125 mg Oral BID  . aspirin  300 mg Rectal Daily   Or  . aspirin  325 mg Oral Daily  . atorvastatin  40 mg Oral q1800  . cholecalciferol  1,000 Units Oral Daily  . enoxaparin (LOVENOX) injection  30 mg Subcutaneous Q24H  . feeding supplement (ENSURE ENLIVE)  237 mL Oral Q1500  . metoprolol tartrate  25 mg Oral BID  . multivitamin with minerals  1 tablet Oral Daily  . polyvinyl alcohol   Both Eyes QHS  . [START ON 02/24/2017] sertraline  25 mg Oral QHS    Continuous Infusions:   LOS: 0 days     NAlma Friendly MD [facility]   If 7PM-7AM, please contact night-coverage [email or web address] Password [email or web address]1/12/2017, 3:41 PM

## 2017-02-23 NOTE — Progress Notes (Signed)
SLP Cancellation Note  Patient Details Name: [name] MRN: [id number] DOB: February 04, 1917   Cancelled treatment:       Reason Eval/Treat Not Completed: SLP screened, no needs identified, will sign off   [name] 02/23/2017, 10:11 AM

## 2017-02-23 NOTE — ED Notes (Signed)
Admitting md to bedside 

## 2017-02-23 NOTE — Care Management Obs Status (Signed)
[place] NOTIFICATION   Patient Details  Name: [name] MRN: [id number] Date of Birth: 1916-10-31   Medicare Observation Status Notification Given:  Yes    Pollie Friar, RN 02/23/2017, 5:25 PM

## 2017-02-23 NOTE — Progress Notes (Signed)
Pt BP @1155  139/48. MD notified, gave verbal order to continue beta blocker. No other complaints, will continue to monitor.

## 2017-02-23 NOTE — Evaluation (Signed)
Occupational Therapy Evaluation Patient Details Name: [name] MRN: [id number] DOB: [birth year] Today's Date: 02/23/2017    History of Present Illness 84 y.o. female with medical history significant of PAF not on anticoagulants, hypertension, hyperlipidemia, depression and anxiety, CAD, STEMI, stent placement, rectal cancer (s/p of partial proctectomy), right ear deaf, s/p of tympanoplasty, who presents with slurred speech and confusion. CT revealed No acute intracranial hemorrhage.    Clinical Impression   Pt admitted with the above diagnoses and presents with below problem list. Pt will benefit from continued acute OT to address the below listed deficits and maximize independence with ADLs prior to d/c home. PTA pt was independent to mod I with ADLs. Pt is currently close to baseline with ADLs. Discussed energy conservation and fall prevention strategies as related to ADLs. No further acute OT needs indicated at this time. OT signing off.       Follow Up Recommendations  Supervision/Assistance - 24 hour;Other (comment)(initially)    Equipment Recommendations  None recommended by OT    Recommendations for Other Services       Precautions / Restrictions Precautions Precautions: Fall Restrictions Weight Bearing Restrictions: No      Mobility Bed Mobility Overal bed mobility: Modified Independent             General bed mobility comments: OOB in chair  Transfers Overall transfer level: Modified independent Equipment used: Rolling walker (2 wheeled)             General transfer comment: good power up and steadying, initial dizziness which resolved within 10 sec    Balance Overall balance assessment: No apparent balance deficits (not formally assessed)                                         ADL either performed or assessed with clinical judgement   ADL Overall ADL's : Needs assistance/impaired Eating/Feeding: Set up;Sitting   Grooming:  Set up;Supervision/safety;Sitting;Standing   Upper Body Bathing: Set up;Sitting   Lower Body Bathing: Supervison/ safety;Sit to/from stand   Upper Body Dressing : Set up;Sitting   Lower Body Dressing: Supervision/safety;Sit to/from stand   Toilet Transfer: Supervision/safety;Ambulation;RW   Toileting- Clothing Manipulation and Hygiene: Supervision/safety   Tub/ Banker: Supervision/safety;Walk-in shower;Grab bars;Rolling walker   Functional mobility during ADLs: Supervision/safety;Rolling walker General ADL Comments: Functional mobility in the room, shower transfer completed. Discussed energy conservation and fall prevention.      Vision         Perception     Praxis      Pertinent Vitals/Pain Pain Assessment: No/denies pain     Hand Dominance Right   Extremity/Trunk Assessment Upper Extremity Assessment Upper Extremity Assessment: Overall WFL for tasks assessed   Lower Extremity Assessment Lower Extremity Assessment: Defer to PT evaluation RLE Deficits / Details: strength grossly 4+/5, ROM WFL RLE Sensation: (intact) RLE Coordination: (WFL) LLE Deficits / Details: strength grossly 4+/5, ROM WFL LLE Sensation: (intact) LLE Coordination: (WFL)   Cervical / Trunk Assessment Cervical / Trunk Assessment: Kyphotic   Communication Communication Communication: HOH;Deaf   Cognition Arousal/Alertness: Awake/alert Behavior During Therapy: WFL for tasks assessed/performed Overall Cognitive Status: Within Functional Limits for tasks assessed                                     General Comments  Niece present during session, reports 24 hour supervision will be available when she first gets home     Exercises     Shoulder Instructions      Home Living Family/patient expects to be discharged to:: Private residence Living Arrangements: Alone Available Help at Discharge: Family;Available 24 hours/day Type of Home: House Home Access: Ramped  entrance     Home Layout: One level     Bathroom Shower/Tub: Walk-in shower;Curtain   Bathroom Toilet: Handicapped height Bathroom Accessibility: Yes   Home Equipment: Environmental consultant - 2 wheels;Cane - single point;Grab bars - tub/shower;Hand held shower head;Shower seat - built in          Prior Functioning/Environment Level of Independence: Independent with assistive device(s)        Comments: uses cane for community ambulation, drives and cooks for the elderly        OT Problem List:        OT Treatment/Interventions:      OT Goals(Current goals can be found in the care plan section) Acute Rehab OT Goals Patient Stated Goal: go home  OT Frequency:     Barriers to D/C:            Co-evaluation              AM-PAC PT "6 Clicks" Daily Activity     Outcome Measure Help from another person eating meals?: None Help from another person taking care of personal grooming?: None Help from another person toileting, which includes using toliet, bedpan, or urinal?: None Help from another person bathing (including washing, rinsing, drying)?: A Little Help from another person to put on and taking off regular upper body clothing?: None Help from another person to put on and taking off regular lower body clothing?: A Little 6 Click Score: 22   End of Session Equipment Utilized During Treatment: Rolling walker  Activity Tolerance: Patient tolerated treatment well Patient left: in chair;with call bell/phone within reach;with chair alarm set;with family/visitor present  OT Visit Diagnosis: Unsteadiness on feet (R26.81)                Time: 1740-8144 OT Time Calculation (min): 20 min Charges:  OT General Charges $OT Visit: 1 Visit OT Evaluation $OT Eval Low Complexity: 1 Low G-Codes:       [name] 02/23/2017, 11:56 AM

## 2017-02-24 DIAGNOSIS — I2510 Atherosclerotic heart disease of native coronary artery without angina pectoris: Secondary | ICD-10-CM | POA: Diagnosis not present

## 2017-02-24 DIAGNOSIS — R4781 Slurred speech: Secondary | ICD-10-CM | POA: Diagnosis not present

## 2017-02-24 DIAGNOSIS — F418 Other specified anxiety disorders: Secondary | ICD-10-CM | POA: Diagnosis not present

## 2017-02-24 DIAGNOSIS — G459 Transient cerebral ischemic attack, unspecified: Secondary | ICD-10-CM | POA: Diagnosis not present

## 2017-02-24 MED ORDER — ATORVASTATIN CALCIUM 40 MG PO TABS: 40.0000 mg | ORAL_TABLET | Freq: Every day | ORAL | 0 refills | Status: DC

## 2017-02-24 NOTE — Progress Notes (Signed)
Patient ready for discharge to home; patient discharge instructions given and reviewed; Rx sent electronically; patient assisted to dress and was discharged out via wheelchair; accompanied by her niece.

## 2017-02-24 NOTE — Discharge Summary (Signed)
Discharge Summary  [name] IHK:[id number] DOB: 09/09/1916  PCP: Joyce Downing, MD  Admit date: 02/22/2017 Discharge date: 02/24/2017  Time spent: <30 mins  Recommendations for Outpatient Follow-up:  1. PCP 2. Cardiology  Discharge Diagnoses:  Active Hospital Problems   Diagnosis Date Noted  . Slurred speech 02/23/2017  . PAF (paroxysmal atrial fibrillation) ([place]) 02/23/2017  . Depression with anxiety 02/23/2017  . SOB (shortness of breath) 10/02/2011  . HTN (hypertension) 11/03/2010  . Hyperlipidemia   . CAD (coronary artery disease)   . Rectal cancer, pT2uN0(pNX) s/p TEM partial proctectomy 09/07/2010    Resolved Hospital Problems  No resolved problems to display.    Discharge Condition: Stable  Diet recommendation: Heart healthy  Vitals:   02/24/17 0545 02/24/17 0904  BP: (!) 119/58 (!) 145/46  Pulse: 65 65  Resp: 17 16  Temp: 97.8 F (36.6 C) 97.8 F (36.6 C)  SpO2: 98% 96%    History of present illness:  Joyce Richwine Heiligis a 84 y.o.femalewith medical history significant ofPAF not onanticoagulants, hypertension, hyperlipidemia, depression and anxiety, CAD, STEMI, stent placement, rectal cancer (s/p ofpartial proctectomy), right ear deaf, s/p tympanoplasty,who presents with slurred speech and confusion for about 30 mins, then resolved spontaneously. A mild right facial droop was also noted, but as per family, its her baseline. No other focal neurologic symptoms was noted. In the ED, CT head was negative for acute intracranial abnormalities. Patient is placed on telemetry for observation for possible TIA.  Today, met pt eating breakfast. Denied any chest pain, SOB, fever/chills, N/V/D/C, no focal neurologic deficit noted. Pt stable for discharge  Hospital Course:  Principal Problem:   Slurred speech Active Problems:   Rectal cancer, pT2uN0(pNX) s/p TEM partial proctectomy   CAD (coronary artery disease)   Hyperlipidemia   HTN  (hypertension)   SOB (shortness of breath)   PAF (paroxysmal atrial fibrillation) ([facility])   Depression with anxiety  Likely TIA Slurred speech, transient confusion, resolved within 30 mins prior to arrival in ED Currently no focal neurologic deficit CT head: No acute intracranial abnormality CT angio head/neck: No emergent large vessel occlusion. At least moderate narrowing of the terminal aspect of the intracranial right internal carotid artery secondary to the presence of atherosclerotic calcification Patient has a history oftympanoplasty, cannot perform MRI ECHO showed EF 60-65%, grade 3 DD Hgb A1c: 5.4, LDL 109 PT/OT/SLP on board, stable for discharge Continue ASA 86m, start lipitor 40 mg   PAF (paroxysmal atrial fibrillation) Rate controlled CHA2DS2-VASc Scoreis 5, needs oral anticoagulation, but pt is not on ACat home, likely due to old age and high risk of fall -continue ASA andmetoprolol  Hx of CAD: Chest pain free -onaspirin, metoprolol, started lipitor  HLD LDL 109 Started Lipitor  HTN  Continue home medications:Metoprolol  Depression and anxiety Stable, no suicidal or homicidal ideations. Continue home medications:Zoloft and Xanax  Rectal cancer, pT2uN0(pNX): s/p TEM partial proctectomy -no acute issues    Procedures:  None  Consultations:  None  Discharge Exam: BP (!) 145/46 (BP Location: Left Arm)   Pulse 65   Temp 97.8 F (36.6 C) (Oral)   Resp 16   Ht '5\' 3"'  (1.6 m)   Wt 50.1 kg (110 lb 7.2 oz)   SpO2 96%   BMI 19.57 kg/m   General: Alert, awake, oriented, NAD  Cardiovascular: S1, S2 present, no added hrt sound Respiratory: Chest clear bilaterally Neuro: Strength 5/5 in all extremities, no focal neurologic deficit noted  Discharge Instructions You were  cared for by a hospitalist during your hospital stay. If you have any questions about your discharge medications or the care you received while you were in the hospital  after you are discharged, you can call the unit and asked to speak with the hospitalist on call if the hospitalist that took care of you is not available. Once you are discharged, your primary care physician will handle any further medical issues. Please note that NO REFILLS for any discharge medications will be authorized once you are discharged, as it is imperative that you return to your primary care physician (or establish a relationship with a primary care physician if you do not have one) for your aftercare needs so that they can reassess your need for medications and monitor your lab values.  Discharge Instructions    Diet - low sodium heart healthy   Complete by:  As directed    Increase activity slowly   Complete by:  As directed      Allergies as of 02/24/2017      Reactions   Effexor [venlafaxine Hydrochloride] Other (See Comments)   Reaction not recalled by the patient   Penicillins Shortness Of Breath   Has patient had a PCN reaction causing immediate rash, facial/tongue/throat swelling, SOB or lightheadedness with hypotension: Yes Has patient had a PCN reaction causing severe rash involving mucus membranes or skin necrosis: No Has patient had a PCN reaction that required hospitalization: No Has patient had a PCN reaction occurring within the last 10 years: No If all of the above answers are "NO", then may proceed with Cephalosporin use.   Sulfa Antibiotics Nausea Only   Dicyclomine Other (See Comments)   Caused confusion   Hydrocodone Nausea Only      Medication List    STOP taking these medications   neomycin-bacitracin-polymyxin Oint Commonly known as:  NEOSPORIN   traMADol 50 MG tablet Commonly known as:  ULTRAM     TAKE these medications   acetaminophen 500 MG tablet Commonly known as:  TYLENOL Take 2 tablets (1,000 mg total) by mouth every 6 (six) hours as needed for mild pain.   ALPRAZolam 0.25 MG tablet Commonly known as:  XANAX Take 0.125 mg by mouth 2  (two) times daily.   aspirin EC 81 MG tablet Take 81 mg by mouth daily.   aspirin-acetaminophen-caffeine 250-250-65 MG tablet Commonly known as:  EXCEDRIN MIGRAINE Take 1 tablet by mouth every 6 (six) hours as needed for headache.   atorvastatin 40 MG tablet Commonly known as:  LIPITOR Take 1 tablet (40 mg total) by mouth daily at 6 PM.   beta carotene w/minerals tablet Take 1 tablet by mouth daily.   BIOTIN PO Take 1 tablet by mouth daily.   cholecalciferol 1000 units tablet Commonly known as:  VITAMIN D Take 1,000 Units by mouth daily.   feeding supplement (ENSURE ENLIVE) Liqd Take 237 mLs by mouth daily at 3 pm.   metoprolol tartrate 25 MG tablet Commonly known as:  LOPRESSOR TAKE ONE TABLET BY MOUTH TWICE DAILY What changed:    how much to take  how to take this  when to take this   multivitamin tablet Take 1 tablet by mouth daily.   nitroGLYCERIN 0.4 MG SL tablet Commonly known as:  NITROSTAT Place 1 tablet (0.4 mg total) under the tongue every 5 (five) minutes as needed for chest pain (MAX 3 TABLETS).   sertraline 25 MG tablet Commonly known as:  ZOLOFT Take 25 mg by mouth  at bedtime.   SYSTANE 0.4-0.3 % Gel ophthalmic gel Generic drug:  Polyethyl Glycol-Propyl Glycol Place 1 drop into both eyes at bedtime.      Allergies  Allergen Reactions  . Effexor [Venlafaxine Hydrochloride] Other (See Comments)    Reaction not recalled by the patient  . Penicillins Shortness Of Breath    Has patient had a PCN reaction causing immediate rash, facial/tongue/throat swelling, SOB or lightheadedness with hypotension: Yes Has patient had a PCN reaction causing severe rash involving mucus membranes or skin necrosis: No Has patient had a PCN reaction that required hospitalization: No Has patient had a PCN reaction occurring within the last 10 years: No If all of the above answers are "NO", then may proceed with Cephalosporin use.   . Sulfa Antibiotics Nausea Only    . Dicyclomine Other (See Comments)    Caused confusion  . Hydrocodone Nausea Only      The results of significant diagnostics from this hospitalization (including imaging, microbiology, ancillary and laboratory) are listed below for reference.    Significant Diagnostic Studies: Ct Angio Head W Or Wo Contrast  Result Date: 02/23/2017 CLINICAL DATA:  Slurred speech and altered mental status. Resolution of symptoms at time of scan. Suspected TIA. EXAM: CT ANGIOGRAPHY HEAD AND NECK TECHNIQUE: Multidetector CT imaging of the head and neck was performed using the standard protocol during bolus administration of intravenous contrast. Multiplanar CT image reconstructions and MIPs were obtained to evaluate the vascular anatomy. Carotid stenosis measurements (when applicable) are obtained utilizing NASCET criteria, using the distal internal carotid diameter as the denominator. CONTRAST:  24m ISOVUE-370 IOPAMIDOL (ISOVUE-370) INJECTION 76% COMPARISON:  Head CT 02/22/2017 FINDINGS: CTA NECK FINDINGS Aortic arch: There is mild calcific atherosclerosis of the aortic arch. There is no aneurysm, dissection or hemodynamically significant stenosis of the visualized ascending aorta and aortic arch. Conventional 3 vessel aortic branching pattern. The visualized proximal subclavian arteries are normal. Right carotid system: The right common carotid origin is widely patent. There is no common carotid or internal carotid artery dissection or aneurysm. Atherosclerotic calcification at the carotid bifurcation without hemodynamically significant stenosis. Left carotid system: The left common carotid origin is widely patent. There is no common carotid or internal carotid artery dissection or aneurysm. Atherosclerotic calcification at the carotid bifurcation without hemodynamically significant stenosis. Vertebral arteries: The vertebral system is codominant. Both vertebral artery origins are normal. Both vertebral arteries are  normal to their confluence with the basilar artery. Skeleton: C3-C5 fusion anteriorly and posteriorly on the right. Multilevel degenerative disc disease. No bony spinal canal stenosis. Other neck: The nasopharynx is clear. The oropharynx and hypopharynx are normal. The epiglottis is normal. The supraglottic larynx, glottis and subglottic larynx are normal. No retropharyngeal collection. The parapharyngeal spaces are preserved. The parotid and submandibular glands are normal. No sialolithiasis or salivary ductal dilatation. The thyroid gland is normal. There is no cervical lymphadenopathy. Upper chest: No pneumothorax or pleural effusion. No nodules or masses. Review of the MIP images confirms the above findings CTA HEAD FINDINGS Anterior circulation: --Intracranial internal carotid arteries: There is atherosclerotic calcification of both internal carotid arteries at the skull base. This results in at least moderate narrowing of the right ICA terminus. No significant stenosis on the left. --Anterior cerebral arteries: Normal. --Middle cerebral arteries: Normal. --Posterior communicating arteries: Absent bilaterally. Posterior circulation: --Posterior cerebral arteries: Normal. --Superior cerebellar arteries: Normal. --Basilar artery: Normal. --Anterior inferior cerebellar arteries: Normal on the right. Not clearly seen on the left. --Posterior inferior cerebellar arteries:  Normal. Venous sinuses: As permitted by contrast timing, patent. Anatomic variants: None Delayed phase: No parenchymal contrast enhancement. Review of the MIP images confirms the above findings. IMPRESSION: 1. No emergent large vessel occlusion. 2. At least moderate narrowing of the terminal aspect of the intracranial right internal carotid artery secondary to the presence of atherosclerotic calcification. 3. No dissection, occlusion or hemodynamically significant stenosis of the cervical carotid or vertebral arteries. 4.  Aortic Atherosclerosis  (ICD10-I70.0). Electronically Signed   By: Ulyses Jarred M.D.   On: 02/23/2017 02:43   Dg Chest 2 View  Result Date: 02/23/2017 CLINICAL DATA:  84 year old female with altered mental status EXAM: CHEST  2 VIEW COMPARISON:  Chest radiograph dated 11/13/2015 and chest CT dated 11/03/2014 FINDINGS: There is emphysematous changes of the lungs. Areas of nodularity in the right mid lung field likely corresponds to the nodular densities seen on the prior radiograph and CT. No new consolidative change. There is no pleural effusion or pneumothorax. Stable top-normal cardiac size. Calcification of the mitral annulus. Atherosclerotic calcification of the aortic arch. Osteopenia with degenerative changes of the spine. No acute osseous pathology. IMPRESSION: 1. No acute cardiopulmonary process. 2. Emphysema. Areas of nodularity in the right mid lung field similar to prior radiograph. Electronically Signed   By: Anner Crete M.D.   On: 02/23/2017 02:39   Ct Head Wo Contrast  Result Date: 02/22/2017 CLINICAL DATA:  84 year old female with slurred speech.  TIA. EXAM: CT HEAD WITHOUT CONTRAST TECHNIQUE: Contiguous axial images were obtained from the base of the skull through the vertex without intravenous contrast. COMPARISON:  Head CT dated 07/03/2015 FINDINGS: Brain: There is moderate age-related atrophy and chronic microvascular ischemic changes. No acute intracranial hemorrhage. No mass effect or midline shift. No extra-axial fluid collection. Vascular: No hyperdense vessel or unexpected calcification. Skull: Normal. Negative for fracture or focal lesion. Sinuses/Orbits: There is opacification of the right sphenoid sinus. The remainder of the visualized paranasal sinuses and mastoid air cells are clear. No air-fluid level. Other: None IMPRESSION: 1. No acute intracranial hemorrhage. 2. Age-related atrophy and chronic microvascular ischemic changes. Electronically Signed   By: Anner Crete M.D.   On: 02/22/2017  23:36   Ct Angio Neck W Or Wo Contrast  Result Date: 02/23/2017 CLINICAL DATA:  Slurred speech and altered mental status. Resolution of symptoms at time of scan. Suspected TIA. EXAM: CT ANGIOGRAPHY HEAD AND NECK TECHNIQUE: Multidetector CT imaging of the head and neck was performed using the standard protocol during bolus administration of intravenous contrast. Multiplanar CT image reconstructions and MIPs were obtained to evaluate the vascular anatomy. Carotid stenosis measurements (when applicable) are obtained utilizing NASCET criteria, using the distal internal carotid diameter as the denominator. CONTRAST:  22m ISOVUE-370 IOPAMIDOL (ISOVUE-370) INJECTION 76% COMPARISON:  Head CT 02/22/2017 FINDINGS: CTA NECK FINDINGS Aortic arch: There is mild calcific atherosclerosis of the aortic arch. There is no aneurysm, dissection or hemodynamically significant stenosis of the visualized ascending aorta and aortic arch. Conventional 3 vessel aortic branching pattern. The visualized proximal subclavian arteries are normal. Right carotid system: The right common carotid origin is widely patent. There is no common carotid or internal carotid artery dissection or aneurysm. Atherosclerotic calcification at the carotid bifurcation without hemodynamically significant stenosis. Left carotid system: The left common carotid origin is widely patent. There is no common carotid or internal carotid artery dissection or aneurysm. Atherosclerotic calcification at the carotid bifurcation without hemodynamically significant stenosis. Vertebral arteries: The vertebral system is codominant. Both vertebral artery origins  are normal. Both vertebral arteries are normal to their confluence with the basilar artery. Skeleton: C3-C5 fusion anteriorly and posteriorly on the right. Multilevel degenerative disc disease. No bony spinal canal stenosis. Other neck: The nasopharynx is clear. The oropharynx and hypopharynx are normal. The epiglottis  is normal. The supraglottic larynx, glottis and subglottic larynx are normal. No retropharyngeal collection. The parapharyngeal spaces are preserved. The parotid and submandibular glands are normal. No sialolithiasis or salivary ductal dilatation. The thyroid gland is normal. There is no cervical lymphadenopathy. Upper chest: No pneumothorax or pleural effusion. No nodules or masses. Review of the MIP images confirms the above findings CTA HEAD FINDINGS Anterior circulation: --Intracranial internal carotid arteries: There is atherosclerotic calcification of both internal carotid arteries at the skull base. This results in at least moderate narrowing of the right ICA terminus. No significant stenosis on the left. --Anterior cerebral arteries: Normal. --Middle cerebral arteries: Normal. --Posterior communicating arteries: Absent bilaterally. Posterior circulation: --Posterior cerebral arteries: Normal. --Superior cerebellar arteries: Normal. --Basilar artery: Normal. --Anterior inferior cerebellar arteries: Normal on the right. Not clearly seen on the left. --Posterior inferior cerebellar arteries: Normal. Venous sinuses: As permitted by contrast timing, patent. Anatomic variants: None Delayed phase: No parenchymal contrast enhancement. Review of the MIP images confirms the above findings. IMPRESSION: 1. No emergent large vessel occlusion. 2. At least moderate narrowing of the terminal aspect of the intracranial right internal carotid artery secondary to the presence of atherosclerotic calcification. 3. No dissection, occlusion or hemodynamically significant stenosis of the cervical carotid or vertebral arteries. 4.  Aortic Atherosclerosis (ICD10-I70.0). Electronically Signed   By: Ulyses Jarred M.D.   On: 02/23/2017 02:43    Microbiology: No results found for this or any previous visit (from the past 240 hour(s)).   Labs: Basic Metabolic Panel: Recent Labs  Lab 02/22/17 2200  NA 137  K 4.1  CL 105  CO2  22  GLUCOSE 83  BUN 22*  CREATININE 0.76  CALCIUM 10.0   Liver Function Tests: Recent Labs  Lab 02/22/17 2200  AST 31  ALT 20  ALKPHOS 62  BILITOT 0.7  PROT 7.4  ALBUMIN 3.7   No results for input(s): LIPASE, AMYLASE in the last 168 hours. No results for input(s): AMMONIA in the last 168 hours. CBC: Recent Labs  Lab 02/22/17 2200  WBC 7.4  NEUTROABS 3.4  HGB 13.2  HCT 40.4  MCV 94.6  PLT 204   Cardiac Enzymes: Recent Labs  Lab 02/22/17 2200  TROPONINI <0.03   BNP: BNP (last 3 results) Recent Labs    02/23/17 0503  BNP 193.9*    ProBNP (last 3 results) No results for input(s): PROBNP in the last 8760 hours.  CBG: Recent Labs  Lab 02/22/17 2154  GLUCAP 72       Signed:  Alma Friendly, MD [facility] 02/24/2017, 5:47 PM

## 2017-02-27 ENCOUNTER — Telehealth: Payer: Self-pay | Admitting: Cardiovascular Disease

## 2017-02-27 NOTE — Telephone Encounter (Signed)
Mrs. Cinco is asking that you give her a call when you get a chance . Thank you

## 2017-02-27 NOTE — Telephone Encounter (Signed)
I spoke with [name]. She reports she was recently in hospital and was told to follow up with cardiology. Has appointment on 2/6 with Dr. Angelena Form and is calling to see if sooner appointment available. No availability on Dr. Camillia Herter schedule and I offered to schedule appointment with PA or NP.  [name] is seeing primary care tomorrow.  [name] will plan on keeping appointment with Dr. Angelena Form on 2/6 and will call us back if she would like sooner appointment with PA or NP.

## 2017-03-21 ENCOUNTER — Encounter: Payer: Self-pay | Admitting: Cardiovascular Disease

## 2017-03-21 ENCOUNTER — Ambulatory Visit (INDEPENDENT_AMBULATORY_CARE_PROVIDER_SITE_OTHER): Payer: Medicare Other | Admitting: Cardiovascular Disease

## 2017-03-21 VITALS — BP 134/58 | HR 61 | Ht 63.0 in | Wt 111.8 lb

## 2017-03-21 DIAGNOSIS — I2510 Atherosclerotic heart disease of native coronary artery without angina pectoris: Secondary | ICD-10-CM

## 2017-03-21 DIAGNOSIS — I351 Nonrheumatic aortic (valve) insufficiency: Secondary | ICD-10-CM | POA: Diagnosis not present

## 2017-03-21 DIAGNOSIS — I10 Essential (primary) hypertension: Secondary | ICD-10-CM

## 2017-03-21 DIAGNOSIS — I483 Typical atrial flutter: Secondary | ICD-10-CM | POA: Diagnosis not present

## 2017-03-21 NOTE — Patient Instructions (Signed)

## 2017-03-21 NOTE — Progress Notes (Signed)
Chief Complaint  Patient presents with  . Follow-up    CAD     History of Present Illness: 84 yo female with history of CAD, aortic valve insufficiency, mitral regurgitation, rectal cancer and atrial flutter who is here today for cardiac follow up. She was admitted to [facility] July 2012 with an inferior STEMI and was found to have a totally occluded RCA which was treated with a bare metal stent. Staged PCI of a severe stenosis in the proximal Circumflex treated with a bare metal stent. Echo March 2016 with normal LV function, moderate AI, mild MR. She is known to have SVT, PVCs and PACs documented by cardiac monitor in 2013. She was found to have atrial flutter in September 2017. Anti-coagulation was not started given advanced age and fall risk. She has been on ASA and a beta blocker. She has a history of rectal cancer and has a rectovaginal fistula. She was admitted with a probable TIA in January 2018 and has recovered neurologically. Lipitor was started.   She is here today for follow up. The patient denies any chest pain, dyspnea, palpitations, lower extremity edema, orthopnea, PND, dizziness, near syncope or syncope. She feels well overall. She does get dyspneic with moderate exertion.   Primary Care Physician: Leonard Downing, MD  Past Medical History:  Diagnosis Date  . Arthritis    FINGERS   . CAD (coronary artery disease) CARDIOLOGIST-- DR Angelena Form   a. s/p INF STEMI 7/12: tx with BMS to RCA;  b. cath 08/26/10: pLAD 30%, mLAD 50%, D1 40%, pCFX 95%, mRCA occluded;   c. staged PCI of pCFX with BMS;   d. echo 7/12:   EF 60-65%, mild RAE, mild to moderate AI, mild MR, moderate TR, RVE, PASP 47  . Complication of anesthesia PT STATES "MADE HER FEEL CRAZY"  . Degeneration of eye    left eye cornea  . Dyspnea   . First degree heart block   . Heart palpitations PAC'S AND SVT RUN'S  PER CARDIOLOGIST NOTE  . History of ST elevation myocardial infarction (STEMI) 08-26-2010-- INFERIOR WALL     S/P PCI  BMS IN RCA AND PROX. CX  . Hyperlipidemia   . Hypertension   . Impaired hearing BILATERAL HEARING AIDS  . Osteopenia   . PAF (paroxysmal atrial fibrillation) ([place])   . PONV (postoperative nausea and vomiting)   . Pulmonary nodules BENIGN  PER CT  10-12-2010  . Rectal Cancer 08/2010   adenocarcinoma   S/P PARTIAL PROCTECTOMY (NO CHEMO OR RADIATION)  . 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 S/P coronary artery stent placement 08/2010   X2  BM    Past Surgical History:  Procedure Laterality Date  . ABDOMINAL HYSTERECTOMY  1950's   AND APPENDECTOMY  . CATARACT EXTRACTION W/ INTRAOCULAR LENS  IMPLANT, BILATERAL    . CORONARY ANGIOPLASTY WITH STENT PLACEMENT  08-26-2010  DR [place]   PCI, BM STENT IN RCA  . CORONARY ANGIOPLASTY WITH STENT PLACEMENT  08-29-2010  DR [place]   PCI, BM STENT IN PROXIMAL CIRCUMFLEX  . EXCISION BENIGN CYST RIGHT BREAST    . FISTULA PLUG N/A 06/21/2012   Procedure: insertion of FISTULA PLUG;  Surgeon: Leighton Ruff, MD;  Location: [facility];  Service: General;  Laterality: N/A;  . FLEXIBLE SIGMOIDOSCOPY N/A 04/02/2012   Procedure: FLEXIBLE SIGMOIDOSCOPY;  Surgeon: Leighton Ruff, MD;  Location: [facility];  Service:  Endoscopy;  Laterality: N/A;  . KNEE SURGERY Right 1996  . LAPROSCOPY LYSIS ADHESIONS/ DRAINAGE OF PELVIC ABSCESS/ DIVERTING LOOP SIGMOID COLECTOMY  11-22-2010   POST OP RECTOVAGINAL FISTULA  . PARTIAL PROCTECTOMY BY TEM  11-17-2010   RECTAL CANCER  . RELEASE LEFT CARPAL TUNNEL/ OSTEOTOMY LEFT DISTAL RADIUS  11-10-2009  . STAPEDECTOMY  1970'S  . TONSILLECTOMY  CHILD  . TOTAL HIP ARTHROPLASTY Right 1992  . TRANSTHORACIC ECHOCARDIOGRAM  10-10-2011   NORMAL LV SIZE WITH MILD FOCAL BASAL SEPTAL HYPERTROPHY/ EF 55-60%/ NORMAL RV SIZE AND LVSF/ BIATRIAL ENLARGEMENT/ MILD TO MODERATE AI  &  TR  . TYMPANOPLASTY Left 12-23-2009     Current Outpatient Medications  Medication Sig Dispense Refill  . acetaminophen (TYLENOL) 500 MG tablet Take 2 tablets (1,000 mg total) by mouth every 6 (six) hours as needed for mild pain. 30 tablet 0  . aspirin EC 81 MG tablet Take 81 mg by mouth daily.    Marland Kitchen aspirin-acetaminophen-caffeine (EXCEDRIN MIGRAINE) 250-250-65 MG per tablet Take 1 tablet by mouth every 6 (six) hours as needed for headache.    . beta carotene w/minerals (OCUVITE) tablet Take 1 tablet by mouth daily.    Marland Kitchen BIOTIN PO Take 1 tablet by mouth daily.     . cholecalciferol (VITAMIN D) 1000 units tablet Take 1,000 Units by mouth daily.    . feeding supplement, ENSURE ENLIVE, (ENSURE ENLIVE) LIQD Take 237 mLs by mouth daily at 3 pm. 237 mL 12  . metoprolol tartrate (LOPRESSOR) 25 MG tablet TAKE ONE TABLET BY MOUTH TWICE DAILY (Patient taking differently: Take 12.5 mg by mouth two times a day) 60 tablet 10  . Multiple Vitamin (MULTIVITAMIN) tablet Take 1 tablet by mouth daily.     . nitroGLYCERIN (NITROSTAT) 0.4 MG SL tablet Place 1 tablet (0.4 mg total) under the tongue every 5 (five) minutes as needed for chest pain (MAX 3 TABLETS). 25 tablet 6  . Polyethyl Glycol-Propyl Glycol (SYSTANE) 0.4-0.3 % GEL Place 1 drop into both eyes at bedtime.     . sertraline (ZOLOFT) 25 MG tablet Take 25 mg by mouth at bedtime.     . ALPRAZolam (XANAX) 0.25 MG tablet Take 0.125 mg by mouth 2 (two) times daily.     No current facility-administered medications for this visit.     Allergies  Allergen Reactions  . Effexor [Venlafaxine Hydrochloride] Other (See Comments)    Reaction not recalled by the patient  . Penicillins Shortness Of Breath    Has patient had a PCN reaction causing immediate rash, facial/tongue/throat swelling, SOB or lightheadedness with hypotension: Yes Has patient had a PCN reaction causing severe rash involving mucus membranes or skin necrosis: No Has patient had a PCN reaction that required hospitalization:  No Has patient had a PCN reaction occurring within the last 10 years: No If all of the above answers are "NO", then may proceed with Cephalosporin use.   . Sulfa Antibiotics Nausea Only  . Dicyclomine Other (See Comments)    Caused confusion  . Hydrocodone Nausea Only    Social History   Socioeconomic History  . Marital status: Widowed    Spouse name: Not on file  . Number of children: Not on file  . Years of education: Not on file  . Highest education level: Not on file  Social Needs  . Financial resource strain: Not on file  . Food insecurity - worry: Not on file  . Food insecurity - inability: Not on file  .  Transportation needs - medical: Not on file  . Transportation needs - non-medical: Not on file  Occupational History  . Not on file  Tobacco Use  . Smoking status: Never Smoker  . Smokeless tobacco: Never Used  Substance and Sexual Activity  . Alcohol use: No  . Drug use: No  . Sexual activity: Not on file  Other Topics Concern  . Not on file  Social History Narrative  . Not on file    Family History  Problem Relation Age of Onset  . Kidney disease Mother   . Heart disease Father   . Cancer Brother     Review of Systems:  As stated in the HPI and otherwise negative.   BP (!) 134/58   Pulse 61   Ht 5\' 3"  (1.6 m)   Wt 111 lb 12.8 oz (50.7 kg)   SpO2 96%   BMI 19.80 kg/m   Physical Examination:  General: Well developed, well nourished, NAD  HEENT: OP clear, mucus membranes moist  SKIN: warm, dry. No rashes. Neuro: No focal deficits  Musculoskeletal: Muscle strength 5/5 all ext  Psychiatric: Mood and affect normal  Neck: No JVD, no carotid bruits, no thyromegaly, no lymphadenopathy.  Lungs:Clear bilaterally, no wheezes, rhonci, crackles Cardiovascular: Regular rate and rhythm. No murmurs, gallops or rubs. Abdomen:Soft. Bowel sounds present. Non-tender.  Extremities: No lower extremity edema. Pulses are 2 + in the bilateral DP/PT.  Echo  04/23/14: Left ventricle: The cavity size was normal. There was focal basal hypertrophy. Systolic function was normal. The estimated ejection fraction was in the range of 55% to 60%. Wall motion was normal; there were no regional wall motion abnormalities. Doppler parameters are consistent with abnormal left ventricular relaxation (grade 1 diastolic dysfunction). - Aortic valve: Mildly calcified annulus. Mildly thickened leaflets. There was moderate regurgitation. - Mitral valve: Moderately calcified annulus. There was mild regurgitation. Mean gradient (D): 5 mm Hg. Peak gradient (D): 10 mm Hg. - Left atrium: The atrium was moderately dilated. - Tricuspid valve: There was moderate regurgitation. - Pulmonary arteries: Systolic pressure was mildly increased. PA peak pressure: 38 mm Hg (S).  EKG:  EKG is not  ordered today. The ekg ordered today demonstrates   Recent Labs: 02/22/2017: ALT 20; BUN 22; Creatinine, Ser 0.76; Hemoglobin 13.2; Platelets 204; Potassium 4.1; Sodium 137 02/23/2017: B Natriuretic Peptide 193.9   Lipid Panel    Component Value Date/Time   CHOL 191 02/23/2017 0503   TRIG 77 02/23/2017 0503   HDL 67 02/23/2017 0503   CHOLHDL 2.9 02/23/2017 0503   VLDL 15 02/23/2017 0503   LDLCALC 109 (H) 02/23/2017 0503     Wt Readings from Last 3 Encounters:  03/21/17 111 lb 12.8 oz (50.7 kg)  02/23/17 110 lb 7.2 oz (50.1 kg)  09/01/16 109 lb (49.4 kg)     Other studies Reviewed: Additional studies/ records that were reviewed today include: . Review of the above records demonstrates:    Assessment and Plan:   1. CAD without angina: No chest pain. Her CAD is stable. Will continue ASA, statin and beta blocker.   2. Atrial flutter: No palpitations. Continue beta blocker. No anti-coagulation given advanced age. She did have a TIA in January 2019 but she will be 89 in three months. I think the risk of anti-coagulation is too high at this time.   3.  HTN: BP is controlled. NO changes  4. Aortic insufficiency: Moderate AI in 2016 but no plans to repeat echo given advanced  age. .    Current medicines are reviewed at length with the patient today.  The patient does not have concerns regarding medicines.  The following changes have been made:  no change  Labs/ tests ordered today include:  No orders of the defined types were placed in this encounter.   Disposition:   FU with me in 6 months  Signed, Lauree Chandler, MD 03/21/2017 11:14 AM    [address], [place], [place]  [address] Phone: [phone]; Fax: [phone]

## 2017-03-26 ENCOUNTER — Ambulatory Visit: Payer: Medicare Other | Admitting: Cardiovascular Disease

## 2017-05-02 ENCOUNTER — Other Ambulatory Visit: Payer: Self-pay | Admitting: Cardiovascular Disease

## 2017-05-03 ENCOUNTER — Other Ambulatory Visit: Payer: Self-pay | Admitting: Family Medicine

## 2017-05-03 DIAGNOSIS — R0602 Shortness of breath: Secondary | ICD-10-CM

## 2017-05-03 DIAGNOSIS — R7989 Other specified abnormal findings of blood chemistry: Secondary | ICD-10-CM

## 2017-05-07 ENCOUNTER — Encounter (HOSPITAL_COMMUNITY): Payer: Self-pay

## 2017-05-07 ENCOUNTER — Ambulatory Visit (HOSPITAL_COMMUNITY): Payer: Medicare Other | Attending: Cardiovascular Disease

## 2017-05-07 DIAGNOSIS — R0989 Other specified symptoms and signs involving the circulatory and respiratory systems: Secondary | ICD-10-CM

## 2017-05-08 ENCOUNTER — Telehealth: Payer: Self-pay

## 2017-05-08 NOTE — Telephone Encounter (Signed)
Spoke with patient in regards to her recent SOB, dyspnea with exertion and a BNP 243.5.Marland KitchenMarland Kitchen Dr Arelia Sneddon [phone] would like your recommendation since she had an echo 02/23/17, while admitted in the hospital and these symptoms have progressed since the echo.. Please advise, thank you

## 2017-05-09 NOTE — Telephone Encounter (Signed)
I spoke with pt and told her Dr. Angelena Form had spoken with Dr. Arelia Sneddon.  Pt will call Dr. Arelia Sneddon' office for instructions. I scheduled pt to see Daune Perch, PA on 05/29/17 at 11:15

## 2017-05-09 NOTE — Telephone Encounter (Signed)
I spoke to Dr Arelia Sneddon. We will try Lasix for probable mild volume overload. Pat, Can we set her up to see a PA or NP in the next few weeks? Thanks,chris

## 2017-05-12 ENCOUNTER — Emergency Department (HOSPITAL_COMMUNITY)
Admission: EM | Admit: 2017-05-12 | Discharge: 2017-05-12 | Disposition: A | Discharge status: Home / Self Care | Payer: Medicare Other | Attending: Emergency Medicine | Admitting: Emergency Medicine

## 2017-05-12 ENCOUNTER — Encounter (HOSPITAL_COMMUNITY): Payer: Self-pay | Admitting: Emergency Medicine

## 2017-05-12 ENCOUNTER — Emergency Department (HOSPITAL_COMMUNITY): Payer: Medicare Other

## 2017-05-12 ENCOUNTER — Other Ambulatory Visit: Payer: Self-pay

## 2017-05-12 DIAGNOSIS — E785 Hyperlipidemia, unspecified: Secondary | ICD-10-CM | POA: Insufficient documentation

## 2017-05-12 DIAGNOSIS — Z7982 Long term (current) use of aspirin: Secondary | ICD-10-CM | POA: Diagnosis not present

## 2017-05-12 DIAGNOSIS — I2510 Atherosclerotic heart disease of native coronary artery without angina pectoris: Secondary | ICD-10-CM | POA: Diagnosis not present

## 2017-05-12 DIAGNOSIS — R0789 Other chest pain: Secondary | ICD-10-CM | POA: Insufficient documentation

## 2017-05-12 DIAGNOSIS — I252 Old myocardial infarction: Secondary | ICD-10-CM | POA: Insufficient documentation

## 2017-05-12 DIAGNOSIS — R079 Chest pain, unspecified: Secondary | ICD-10-CM

## 2017-05-12 DIAGNOSIS — Z8673 Personal history of transient ischemic attack (TIA), and cerebral infarction without residual deficits: Secondary | ICD-10-CM | POA: Diagnosis not present

## 2017-05-12 DIAGNOSIS — I480 Paroxysmal atrial fibrillation: Secondary | ICD-10-CM | POA: Diagnosis not present

## 2017-05-12 DIAGNOSIS — Z79899 Other long term (current) drug therapy: Secondary | ICD-10-CM | POA: Diagnosis not present

## 2017-05-12 DIAGNOSIS — I10 Essential (primary) hypertension: Secondary | ICD-10-CM | POA: Diagnosis not present

## 2017-05-12 LAB — BASIC METABOLIC PANEL
Anion gap: 7 (ref 5–15)
BUN: 23 mg/dL — ABNORMAL HIGH (ref 6–20)
CALCIUM: 9.3 mg/dL (ref 8.9–10.3)
CO2: 23 mmol/L (ref 22–32)
Chloride: 108 mmol/L (ref 101–111)
Creatinine, Ser: 0.74 mg/dL (ref 0.44–1.00)
Glucose, Bld: 87 mg/dL (ref 65–99)
POTASSIUM: 4.1 mmol/L (ref 3.5–5.1)
Sodium: 138 mmol/L (ref 135–145)

## 2017-05-12 LAB — CBC WITH DIFFERENTIAL/PLATELET
BASOS ABS: 0 10*3/uL (ref 0.0–0.1)
BASOS PCT: 0 %
EOS ABS: 0.2 10*3/uL (ref 0.0–0.7)
EOS PCT: 3 %
HCT: 34.9 % — ABNORMAL LOW (ref 36.0–46.0)
HEMOGLOBIN: 11.3 g/dL — AB (ref 12.0–15.0)
Lymphocytes Relative: 37 %
Lymphs Abs: 2.4 10*3/uL (ref 0.7–4.0)
MCH: 30.4 pg (ref 26.0–34.0)
MCHC: 32.4 g/dL (ref 30.0–36.0)
MCV: 93.8 fL (ref 78.0–100.0)
Monocytes Absolute: 0.6 10*3/uL (ref 0.1–1.0)
Monocytes Relative: 9 %
NEUTROS PCT: 51 %
Neutro Abs: 3.3 10*3/uL (ref 1.7–7.7)
PLATELETS: 203 10*3/uL (ref 150–400)
RBC: 3.72 MIL/uL — AB (ref 3.87–5.11)
RDW: 13.1 % (ref 11.5–15.5)
WBC: 6.5 10*3/uL (ref 4.0–10.5)

## 2017-05-12 LAB — I-STAT TROPONIN, ED
TROPONIN I, POC: 0 ng/mL (ref 0.00–0.08)
Troponin i, poc: 0 ng/mL (ref 0.00–0.08)

## 2017-05-12 NOTE — ED Notes (Signed)
Patient transported to X-ray 

## 2017-05-12 NOTE — ED Provider Notes (Signed)
[facility] EMERGENCY DEPARTMENT Provider Note   CSN: [id number] Arrival date & time: 05/12/17  0308     History   Chief Complaint Chief Complaint  Patient presents with  . Chest Pain    HPI [name] is a 84 y.o. female.  Patient is a 84 year old female with history of coronary artery disease with stent in 2012.  She presents today for evaluation of chest pain.  This woke her from sleep at approximately 1:30 AM.  She describes pressure in the front of her chest with some associated shortness of breath, but no nausea, diaphoresis, or radiation of her pain.  She tried nitroglycerin with little relief.  Shortly after arriving in the ER, her symptoms resolved.  She now feels fine and has no complaints.  The history is provided by the patient.  Chest Pain   This is a new problem. The current episode started 3 to 5 hours ago. The problem occurs constantly. The problem has been resolved. The pain is present in the substernal region. The pain is moderate. The quality of the pain is described as pressure-like. The pain does not radiate. Associated symptoms include shortness of breath. Pertinent negatives include no cough, no fever and no palpitations.    Past Medical History:  Diagnosis Date  . Arthritis    FINGERS   . CAD (coronary artery disease) CARDIOLOGIST-- DR Angelena Form   a. s/p INF STEMI 7/12: tx with BMS to RCA;  b. cath 08/26/10: pLAD 30%, mLAD 50%, D1 40%, pCFX 95%, mRCA occluded;   c. staged PCI of pCFX with BMS;   d. echo 7/12:   EF 60-65%, mild RAE, mild to moderate AI, mild MR, moderate TR, RVE, PASP 47  . Complication of anesthesia PT STATES "MADE HER FEEL CRAZY"  . Degeneration of eye    left eye cornea  . Dyspnea   . First degree heart block   . Heart palpitations PAC'S AND SVT RUN'S  PER CARDIOLOGIST NOTE  . History of ST elevation myocardial infarction (STEMI) 08-26-2010-- INFERIOR WALL   S/P PCI  BMS IN RCA AND PROX. CX  . Hyperlipidemia   .  Hypertension   . Impaired hearing BILATERAL HEARING AIDS  . Osteopenia   . PAF (paroxysmal atrial fibrillation) ([place])   . PONV (postoperative nausea and vomiting)   . Pulmonary nodules BENIGN  PER CT  10-12-2010  . Rectal Cancer 08/2010   adenocarcinoma   S/P PARTIAL PROCTECTOMY (NO CHEMO OR RADIATION)  . 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 S/P coronary artery stent placement 08/2010   X2  BM    Patient Active Problem List   Diagnosis Date Noted  . Slurred speech 02/23/2017  . PAF (paroxysmal atrial fibrillation) ([place]) 02/23/2017  . Depression with anxiety 02/23/2017  . TIA (transient ischemic attack)   . Fracture, tibia and fibula 07/06/2015  . Tibia/fibula fracture 07/03/2015  . Lung nodule seen on imaging study 09/05/2013  . Exposure to TB 09/05/2013  . SOB (shortness of breath) 10/02/2011  . Vascular skin changes 07/17/2011  . 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 Anorexia post-op 12/08/2010  . HTN (hypertension) 11/03/2010  . Dizziness 09/15/2010  . CAD (coronary artery disease)   . Hyperlipidemia   . Rectal cancer, pT2uN0(pNX) s/p TEM partial proctectomy 09/07/2010    Past Surgical History:  Procedure Laterality Date  . ABDOMINAL HYSTERECTOMY  1950's   AND APPENDECTOMY  . CATARACT EXTRACTION W/ INTRAOCULAR LENS  IMPLANT, BILATERAL    . CORONARY ANGIOPLASTY WITH STENT PLACEMENT  08-26-2010  DR [place]   PCI, BM STENT IN RCA  . CORONARY ANGIOPLASTY WITH STENT PLACEMENT  08-29-2010  DR [place]   PCI, BM STENT IN PROXIMAL CIRCUMFLEX  . EXCISION BENIGN CYST RIGHT BREAST    . FISTULA PLUG N/A 06/21/2012   Procedure: insertion of FISTULA PLUG;  Surgeon: Leighton Ruff, MD;  Location: [facility];  Service: General;  Laterality: N/A;  . FLEXIBLE SIGMOIDOSCOPY N/A 04/02/2012   Procedure: FLEXIBLE SIGMOIDOSCOPY;  Surgeon: Leighton Ruff, MD;   Location: [facility];  Service: Endoscopy;  Laterality: N/A;  . KNEE SURGERY Right 1996  . LAPROSCOPY LYSIS ADHESIONS/ DRAINAGE OF PELVIC ABSCESS/ DIVERTING LOOP SIGMOID COLECTOMY  11-22-2010   POST OP RECTOVAGINAL FISTULA  . PARTIAL PROCTECTOMY BY TEM  11-17-2010   RECTAL CANCER  . RELEASE LEFT CARPAL TUNNEL/ OSTEOTOMY LEFT DISTAL RADIUS  11-10-2009  . STAPEDECTOMY  1970'S  . TONSILLECTOMY  CHILD  . TOTAL HIP ARTHROPLASTY Right 1992  . TRANSTHORACIC ECHOCARDIOGRAM  10-10-2011   NORMAL LV SIZE WITH MILD FOCAL BASAL SEPTAL HYPERTROPHY/ EF 55-60%/ NORMAL RV SIZE AND LVSF/ BIATRIAL ENLARGEMENT/ MILD TO MODERATE AI  &  TR  . TYMPANOPLASTY Left 12-23-2009     OB History   None      Home Medications    Prior to Admission medications   Medication Sig Start Date End Date Taking? Authorizing Provider  acetaminophen (TYLENOL) 500 MG tablet Take 2 tablets (1,000 mg total) by mouth every 6 (six) hours as needed for mild pain. 07/06/15   Rama, Venetia Maxon, MD  ALPRAZolam Duanne Moron) 0.25 MG tablet Take 0.125 mg by mouth 2 (two) times daily.    [provider]  aspirin EC 81 MG tablet Take 81 mg by mouth daily.    [provider]  aspirin-acetaminophen-caffeine (EXCEDRIN MIGRAINE) [phone] MG per tablet Take 1 tablet by mouth every 6 (six) hours as needed for headache.    [provider]  beta carotene w/minerals (OCUVITE) tablet Take 1 tablet by mouth daily.    [provider]  BIOTIN PO Take 1 tablet by mouth daily.     [provider]  cholecalciferol (VITAMIN D) 1000 units tablet Take 1,000 Units by mouth daily.    [provider]  feeding supplement, ENSURE ENLIVE, (ENSURE ENLIVE) LIQD Take 237 mLs by mouth daily at 3 pm. 07/06/15   Rama, Venetia Maxon, MD  metoprolol tartrate (LOPRESSOR) 25 MG tablet TAKE ONE TABLET BY MOUTH TWICE DAILY 05/02/17   Burnell Blanks, MD  Multiple Vitamin (MULTIVITAMIN) tablet Take 1 tablet by mouth  daily.     [provider]  nitroGLYCERIN (NITROSTAT) 0.4 MG SL tablet Place 1 tablet (0.4 mg total) under the tongue every 5 (five) minutes as needed for chest pain (MAX 3 TABLETS). 10/31/13   Burnell Blanks, MD  Polyethyl Glycol-Propyl Glycol (SYSTANE) 0.4-0.3 % GEL Place 1 drop into both eyes at bedtime.     [provider]  sertraline (ZOLOFT) 25 MG tablet Take 25 mg by mouth at bedtime.  04/21/16   [provider]  omeprazole (PRILOSEC) 40 MG capsule Take 40 mg by mouth as needed.  04/26/11  [provider]    Family History Family History  Problem Relation Age of Onset  . Kidney disease Mother   . Heart disease Father   .  Cancer Brother     Social History Social History   Tobacco Use  . Smoking status: Never Smoker  . Smokeless tobacco: Never Used  Substance Use Topics  . Alcohol use: No  . Drug use: No     Allergies   Effexor [venlafaxine hydrochloride]; Penicillins; Sulfa antibiotics; Dicyclomine; and Hydrocodone   Review of Systems Review of Systems  Constitutional: Negative for fever.  Respiratory: Positive for shortness of breath. Negative for cough.   Cardiovascular: Positive for chest pain. Negative for palpitations.  All other systems reviewed and are negative.    Physical Exam Updated Vital Signs BP 125/60   Pulse 63   Temp 97.8 F (36.6 C) (Oral)   Resp 15   Ht 5\' 3"  (1.6 m)   Wt 50.8 kg (112 lb)   SpO2 97%   BMI 19.84 kg/m   Physical Exam  Constitutional: She is oriented to person, place, and time. She appears well-developed and well-nourished. No distress.  HENT:  Head: Normocephalic and atraumatic.  Neck: Normal range of motion. Neck supple.  Cardiovascular: Normal rate and regular rhythm. Exam reveals no gallop and no friction rub.  No murmur heard. Pulmonary/Chest: Effort normal and breath sounds normal. No respiratory distress. She has no wheezes.  Abdominal: Soft. Bowel sounds are normal. She  exhibits no distension. There is no tenderness.  Musculoskeletal: Normal range of motion.  Neurological: She is alert and oriented to person, place, and time.  Skin: Skin is warm and dry. She is not diaphoretic.  Nursing note and vitals reviewed.    ED Treatments / Results  Labs (all labs ordered are listed, but only abnormal results are displayed) Labs Reviewed  BASIC METABOLIC PANEL - Abnormal; Notable for the following components:      Result Value   BUN 23 (*)    All other components within normal limits  CBC WITH DIFFERENTIAL/PLATELET - Abnormal; Notable for the following components:   RBC 3.72 (*)    Hemoglobin 11.3 (*)    HCT 34.9 (*)    All other components within normal limits  I-STAT TROPONIN, ED  I-STAT TROPONIN, ED    EKG None  Radiology Dg Chest 2 View  Result Date: 05/12/2017 CLINICAL DATA:  84 year old female with chest pain. EXAM: CHEST - 2 VIEW COMPARISON:  Chest radiograph dated 02/23/2017 FINDINGS: Diffuse interstitial coarsening. No focal consolidation, pleural effusion, or pneumothorax. Top-normal cardiac silhouette. Calcified mitral annulus. Osteopenia with degenerative changes of the spine. There is compression fracture of a lower thoracic spine with extension of the fracture into the superior and inferior endplates. This fracture is likely acute and new compared to the prior radiograph of 02/23/2017. Correlation with clinical exam and point tenderness recommended. IMPRESSION: 1. No acute cardiopulmonary process. 2. Compression fracture of a lower thoracic vertebra, likely acute. Clinical correlation is recommended. Electronically Signed   By: Anner Crete M.D.   On: 05/12/2017 04:54    Procedures Procedures (including critical care time)  Medications Ordered in ED Medications - No data to display   Initial Impression / Assessment and Plan / ED Course  I have reviewed the triage vital signs and the nursing notes.  Pertinent labs & imaging results  that were available during my care of the patient were reviewed by me and considered in my medical decision making (see chart for details).  Patient with history of coronary artery disease with stent presenting with chest discomfort.  Her workup reveals an unchanged EKG and negative troponin x2.  I  have discussed the disposition with her.  She tells me that she does not want to be in the hospital, nor would she desire a heart cath.  At this point, I think it is appropriate to honor her wishes and allow her to go home.  She understands she can return to the ER if her symptoms worsen or change.  Final Clinical Impressions(s) / ED Diagnoses   Final diagnoses:  None    ED Discharge Orders    None       Veryl Speak, MD 05/12/17 [phone]

## 2017-05-12 NOTE — Discharge Instructions (Addendum)
Return to the emergency department if your symptoms worsen or change.  Continue your medications as previously prescribed.

## 2017-05-12 NOTE — ED Notes (Signed)
ED Provider at bedside. 

## 2017-05-12 NOTE — ED Triage Notes (Addendum)
Per EMS, pt from home c/o left chest discomfort radiated down left arm that started around 0100. Pt took 3 expired nitro and 324 ASA before EMS arrival. EMS gave 1 nitro which help relieved the pain a little bit. EMS EKG sinus with some PVCs and peaked T waves. Hx of Aflutter, stent, MI. EMS VS BP 148/72, 70 P, 97% room air. Pt A&Ox4, ambulatory with cane.

## 2017-05-12 NOTE — ED Notes (Signed)
Pt request blooddraw from IV. 

## 2017-05-23 ENCOUNTER — Telehealth (HOSPITAL_COMMUNITY): Payer: Self-pay | Admitting: Radiology

## 2017-05-23 NOTE — Telephone Encounter (Signed)
I spoke with pt who reports she went to the bank and store the other day and had to come home and sit for awhile due to shortness of breath. Yesterday she was on her feet more than usual and felt short of breath off and on. I cancelled appt for 4/16 and scheduled her to see Dr. Angelena Form on 05/25/17 at 3:00

## 2017-05-23 NOTE — Telephone Encounter (Signed)
Called patient to schedule echocardiogram missed on 05/02/17. Patient stated that she has an appointment to see Daune Perch on Tuesday 05/29/17. She is feeling very short of breath. She, thinks she needs to be seen sooner. She says her shortness of breath is with exertion. She has very little swelling and in right leg and can lay flat at night. I told her I would contact the Triage nurse.

## 2017-05-23 NOTE — Telephone Encounter (Signed)
Spoke with Dr. Camillia Herter nurse, Fraser Din, RN who states she will follow-up with the patient.

## 2017-05-24 NOTE — Progress Notes (Signed)
Chief Complaint  Patient presents with  . Coronary Artery Disease   History of Present Illness: 84 yo female with history of CAD, aortic valve insufficiency, mitral regurgitation, rectal cancer and atrial flutter who is here today for cardiac follow up. She was admitted to [facility] July 2012 with an inferior STEMI and was found to have a total occlusion of the proximal RCA which was treated with a bare metal stent. There was moderate residual disease in the mid RCA and severe disease in the proximal Circumflex artery. A bare metal stent was placed in the circumflex artery in a staged procedure. Echo March 2016 with normal LV function, moderate AI, mild MR. She is known to have SVT, PVCs and PACs documented by cardiac monitor in 2013. She was found to have atrial flutter in September 2017. Anti-coagulation was not started given advanced age and fall risk. She has been on ASA and a beta blocker. She was admitted with a probable TIA in January 2018 and has recovered neurologically. She has had ongoing dyspnea for the last few years.  Echo January 2019 with normal LV systolic function, TMHD=62-22%. There was grade 3 diastolic dysfunction and mild to moderate aortic valve insufficiency. She was seen in the ED on 05/12/17 with chest pain. Troponin was negative. She had no ischemic EKG changes. She was not admitted.   She is here today for follow up. She is having worsened dyspnea. This occurs almost every day. Mainly when getting dressed and when walking longer than 100 feet. This resolves with rest. The patient denies any exertional chest pain, palpitations, lower extremity edema, orthopnea, PND, dizziness, near syncope or syncope. Her weight has been stable.   Primary Care Physician: Leonard Downing, MD  Past Medical History:  Diagnosis Date  . Arthritis    FINGERS   . CAD (coronary artery disease) CARDIOLOGIST-- DR Angelena Form   a. s/p INF STEMI 7/12: tx with BMS to RCA;  b. cath 08/26/10: pLAD 30%,  mLAD 50%, D1 40%, pCFX 95%, mRCA occluded;   c. staged PCI of pCFX with BMS;   d. echo 7/12:   EF 60-65%, mild RAE, mild to moderate AI, mild MR, moderate TR, RVE, PASP 47  . Complication of anesthesia PT STATES "MADE HER FEEL CRAZY"  . Degeneration of eye    left eye cornea  . Dyspnea   . First degree heart block   . Heart palpitations PAC'S AND SVT RUN'S  PER CARDIOLOGIST NOTE  . History of ST elevation myocardial infarction (STEMI) 08-26-2010-- INFERIOR WALL   S/P PCI  BMS IN RCA AND PROX. CX  . Hyperlipidemia   . Hypertension   . Impaired hearing BILATERAL HEARING AIDS  . Osteopenia   . PAF (paroxysmal atrial fibrillation) ([place])   . PONV (postoperative nausea and vomiting)   . Pulmonary nodules BENIGN  PER CT  10-12-2010  . Rectal Cancer 08/2010   adenocarcinoma   S/P PARTIAL PROCTECTOMY (NO CHEMO OR RADIATION)  . 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 S/P coronary artery stent placement 08/2010   X2  BM    Past Surgical History:  Procedure Laterality Date  . ABDOMINAL HYSTERECTOMY  1950's   AND APPENDECTOMY  . CATARACT EXTRACTION W/ INTRAOCULAR LENS  IMPLANT, BILATERAL    . CORONARY ANGIOPLASTY WITH STENT PLACEMENT  08-26-2010  DR [place]   PCI, BM STENT IN RCA  . CORONARY ANGIOPLASTY WITH STENT PLACEMENT  08-29-2010  DR [place]   PCI, BM STENT IN PROXIMAL CIRCUMFLEX  . EXCISION BENIGN CYST RIGHT BREAST    . FISTULA PLUG N/A 06/21/2012   Procedure: insertion of FISTULA PLUG;  Surgeon: Leighton Ruff, MD;  Location: [facility];  Service: General;  Laterality: N/A;  . FLEXIBLE SIGMOIDOSCOPY N/A 04/02/2012   Procedure: FLEXIBLE SIGMOIDOSCOPY;  Surgeon: Leighton Ruff, MD;  Location: [facility];  Service: Endoscopy;  Laterality: N/A;  . KNEE SURGERY Right 1996  . LAPROSCOPY LYSIS ADHESIONS/ DRAINAGE OF PELVIC ABSCESS/ DIVERTING LOOP SIGMOID COLECTOMY  11-22-2010   POST OP  RECTOVAGINAL FISTULA  . PARTIAL PROCTECTOMY BY TEM  11-17-2010   RECTAL CANCER  . RELEASE LEFT CARPAL TUNNEL/ OSTEOTOMY LEFT DISTAL RADIUS  11-10-2009  . STAPEDECTOMY  1970'S  . TONSILLECTOMY  CHILD  . TOTAL HIP ARTHROPLASTY Right 1992  . TRANSTHORACIC ECHOCARDIOGRAM  10-10-2011   NORMAL LV SIZE WITH MILD FOCAL BASAL SEPTAL HYPERTROPHY/ EF 55-60%/ NORMAL RV SIZE AND LVSF/ BIATRIAL ENLARGEMENT/ MILD TO MODERATE AI  &  TR  . TYMPANOPLASTY Left 12-23-2009    Current Outpatient Medications  Medication Sig Dispense Refill  . acetaminophen (TYLENOL) 500 MG tablet Take 2 tablets (1,000 mg total) by mouth every 6 (six) hours as needed for mild pain. 30 tablet 0  . ALPRAZolam (XANAX) 0.25 MG tablet Take 0.125 mg by mouth 2 (two) times daily.    Marland Kitchen aspirin EC 81 MG tablet Take 81 mg by mouth daily.    Marland Kitchen aspirin-acetaminophen-caffeine (EXCEDRIN MIGRAINE) 250-250-65 MG per tablet Take 1 tablet by mouth every 6 (six) hours as needed for headache.    . beta carotene w/minerals (OCUVITE) tablet Take 1 tablet by mouth daily.    Marland Kitchen BIOTIN PO Take 1 tablet by mouth daily.     . cholecalciferol (VITAMIN D) 1000 units tablet Take 1,000 Units by mouth daily.    . feeding supplement, ENSURE ENLIVE, (ENSURE ENLIVE) LIQD Take 237 mLs by mouth daily at 3 pm. 237 mL 12  . metoprolol tartrate (LOPRESSOR) 25 MG tablet TAKE ONE TABLET BY MOUTH TWICE DAILY 60 tablet 10  . Multiple Vitamin (MULTIVITAMIN) tablet Take 1 tablet by mouth daily.     . nitroGLYCERIN (NITROSTAT) 0.4 MG SL tablet Place 1 tablet (0.4 mg total) under the tongue every 5 (five) minutes as needed for chest pain (MAX 3 TABLETS). 25 tablet 6  . Polyethyl Glycol-Propyl Glycol (SYSTANE) 0.4-0.3 % GEL Place 1 drop into both eyes at bedtime.     . sertraline (ZOLOFT) 25 MG tablet Take 25 mg by mouth at bedtime.      No current facility-administered medications for this visit.     Allergies  Allergen Reactions  . Effexor [Venlafaxine Hydrochloride]  Other (See Comments)    Reaction not recalled by the patient  . Penicillins Shortness Of Breath    Has patient had a PCN reaction causing immediate rash, facial/tongue/throat swelling, SOB or lightheadedness with hypotension: Yes Has patient had a PCN reaction causing severe rash involving mucus membranes or skin necrosis: No Has patient had a PCN reaction that required hospitalization: No Has patient had a PCN reaction occurring within the last 10 years: No If all of the above answers are "NO", then may proceed with Cephalosporin use.   . Sulfa Antibiotics Nausea Only  . Dicyclomine Other (See Comments)    Caused confusion  . Hydrocodone Nausea Only    Social History   Socioeconomic History  . Marital status: Widowed  Spouse name: Not on file  . Number of children: Not on file  . Years of education: Not on file  . Highest education level: Not on file  Occupational History  . Not on file  Social Needs  . Financial resource strain: Not on file  . Food insecurity:    Worry: Not on file    Inability: Not on file  . Transportation needs:    Medical: Not on file    Non-medical: Not on file  Tobacco Use  . Smoking status: Never Smoker  . Smokeless tobacco: Never Used  Substance and Sexual Activity  . Alcohol use: No  . Drug use: No  . Sexual activity: Not on file  Lifestyle  . Physical activity:    Days per week: Not on file    Minutes per session: Not on file  . Stress: Not on file  Relationships  . Social connections:    Talks on phone: Not on file    Gets together: Not on file    Attends religious service: Not on file    Active member of club or organization: Not on file    Attends meetings of clubs or organizations: Not on file    Relationship status: Not on file  . Intimate partner violence:    Fear of current or ex partner: Not on file    Emotionally abused: Not on file    Physically abused: Not on file    Forced sexual activity: Not on file  Other Topics  Concern  . Not on file  Social History Narrative  . Not on file    Family History  Problem Relation Age of Onset  . Kidney disease Mother   . Heart disease Father   . Cancer Brother     Review of Systems:  As stated in the HPI and otherwise negative.   BP 132/60   Pulse 67   Ht 5\' 3"  (1.6 m)   Wt 112 lb 6.4 oz (51 kg)   SpO2 99%   BMI 19.91 kg/m   Physical Examination:  General: Well developed, well nourished, NAD  HEENT: OP clear, mucus membranes moist  SKIN: warm, dry. No rashes. Neuro: No focal deficits  Musculoskeletal: Muscle strength 5/5 all ext  Psychiatric: Mood and affect normal  Neck: No JVD, no carotid bruits, no thyromegaly, no lymphadenopathy.  Lungs:Clear bilaterally, no wheezes, rhonci, crackles Cardiovascular: Regular rate and rhythm. Soft murmur.  Abdomen:Soft. Bowel sounds present. Non-tender.  Extremities: No lower extremity edema. Pulses are 2 + in the bilateral DP/PT.  Echo 02/23/17: - Left ventricle: The cavity size was normal. There was moderate   focal basal and mild concentric hypertrophy. Systolic function   was normal. The estimated ejection fraction was in the range of   60% to 65%. Wall motion was normal; there were no regional wall   motion abnormalities. There was a reduced contribution of atrial   contraction to ventricular filling, due to increased ventricular   diastolic pressure or atrial contractile dysfunction. Doppler   parameters are consistent with a reversible restrictive pattern,   indicative of decreased left ventricular diastolic compliance   and/or increased left atrial pressure (grade 3 diastolic   dysfunction). Doppler parameters are consistent with high   ventricular filling pressure. - Aortic valve: There was mild to moderate regurgitation. - Mitral valve: Severely calcified annulus. - Left atrium: The atrium was mildly dilated. - Pulmonic valve: There was trivial regurgitation. - Pulmonary arteries: PA peak  pressure: 32  mm Hg (S).  EKG:  EKG is ordered today. The ekg ordered today demonstrates Sinus, rate 67 bpm. 1st degree AV block. Incomplete RBBB  Recent Labs: 02/22/2017: ALT 20 02/23/2017: B Natriuretic Peptide 193.9 05/12/2017: BUN 23; Creatinine, Ser 0.74; Hemoglobin 11.3; Platelets 203; Potassium 4.1; Sodium 138   Lipid Panel    Component Value Date/Time   CHOL 191 02/23/2017 0503   TRIG 77 02/23/2017 0503   HDL 67 02/23/2017 0503   CHOLHDL 2.9 02/23/2017 0503   VLDL 15 02/23/2017 0503   LDLCALC 109 (H) 02/23/2017 0503     Wt Readings from Last 3 Encounters:  05/25/17 112 lb 6.4 oz (51 kg)  05/12/17 112 lb (50.8 kg)  03/21/17 111 lb 12.8 oz (50.7 kg)     Other studies Reviewed: Additional studies/ records that were reviewed today include: . Review of the above records demonstrates:    Assessment and Plan:   1. CAD without angina: She has no exertional chest pain. She does have dyspnea. I cannot exclude this as an anginal equivalent but she is not a good candidate for a cath given her advanced age. She does not wish to consider a cath. Will continue ASA and beta blocker.   2. Atrial flutter, persistent: She does not feel palpitations. She is in sinus today. We have elected not to start anti-coagulation given advanced age. Will continue beta blocker.    3. HTN: BP is well controlled. No changes.   4. Aortic insufficiency: Mild to moderate AI by echo January 2019. No change since 2016.     5. Dyspnea: Unclear etiology. This is not due to her valve disease as it is only mild. This could be related to CAD but she is not a cath candidate given frailty and advanced age. She is not volume overloaded. I have reviewed chest x-rays from January and March of this year and she has chronic changes c/w emphysema. This may also be contributing to her dyspnea. I will have her follow up in primary care to discuss this possibility.   Current medicines are reviewed at length with the patient  today.  The patient does not have concerns regarding medicines.  The following changes have been made:  no change  Labs/ tests ordered today include:  No orders of the defined types were placed in this encounter.   Disposition:   FU with me in 6 months  Signed, Lauree Chandler, MD 05/25/2017 3:41 PM    [facility] [address], [place], [place]  [address] Phone: [phone]; Fax: [phone]

## 2017-05-24 NOTE — Telephone Encounter (Signed)
Thanks

## 2017-05-25 ENCOUNTER — Encounter: Payer: Self-pay | Admitting: Cardiovascular Disease

## 2017-05-25 ENCOUNTER — Ambulatory Visit (INDEPENDENT_AMBULATORY_CARE_PROVIDER_SITE_OTHER): Payer: Medicare Other | Admitting: Cardiovascular Disease

## 2017-05-25 VITALS — BP 132/60 | HR 67 | Ht 63.0 in | Wt 112.4 lb

## 2017-05-25 DIAGNOSIS — I483 Typical atrial flutter: Secondary | ICD-10-CM | POA: Diagnosis not present

## 2017-05-25 DIAGNOSIS — I351 Nonrheumatic aortic (valve) insufficiency: Secondary | ICD-10-CM | POA: Diagnosis not present

## 2017-05-25 DIAGNOSIS — R0609 Other forms of dyspnea: Secondary | ICD-10-CM

## 2017-05-25 DIAGNOSIS — I2510 Atherosclerotic heart disease of native coronary artery without angina pectoris: Secondary | ICD-10-CM | POA: Diagnosis not present

## 2017-05-25 DIAGNOSIS — I10 Essential (primary) hypertension: Secondary | ICD-10-CM | POA: Diagnosis not present

## 2017-05-25 NOTE — Patient Instructions (Signed)

## 2017-05-29 ENCOUNTER — Ambulatory Visit: Payer: Medicare Other | Admitting: Cardiology

## 2017-06-28 IMAGING — CR DG CHEST 1V PORT
1 series · 1 of 1 positions shown · non-contrast
Comparison: CTA chest 11/03/2014. Chest x-rays of that same date
and earlier.

CLINICAL DATA: [AGE] with distal right tibia and fibula
fractures, preoperative respiratory evaluation.

EXAM:
PORTABLE CHEST 1 VIEW

[AP]
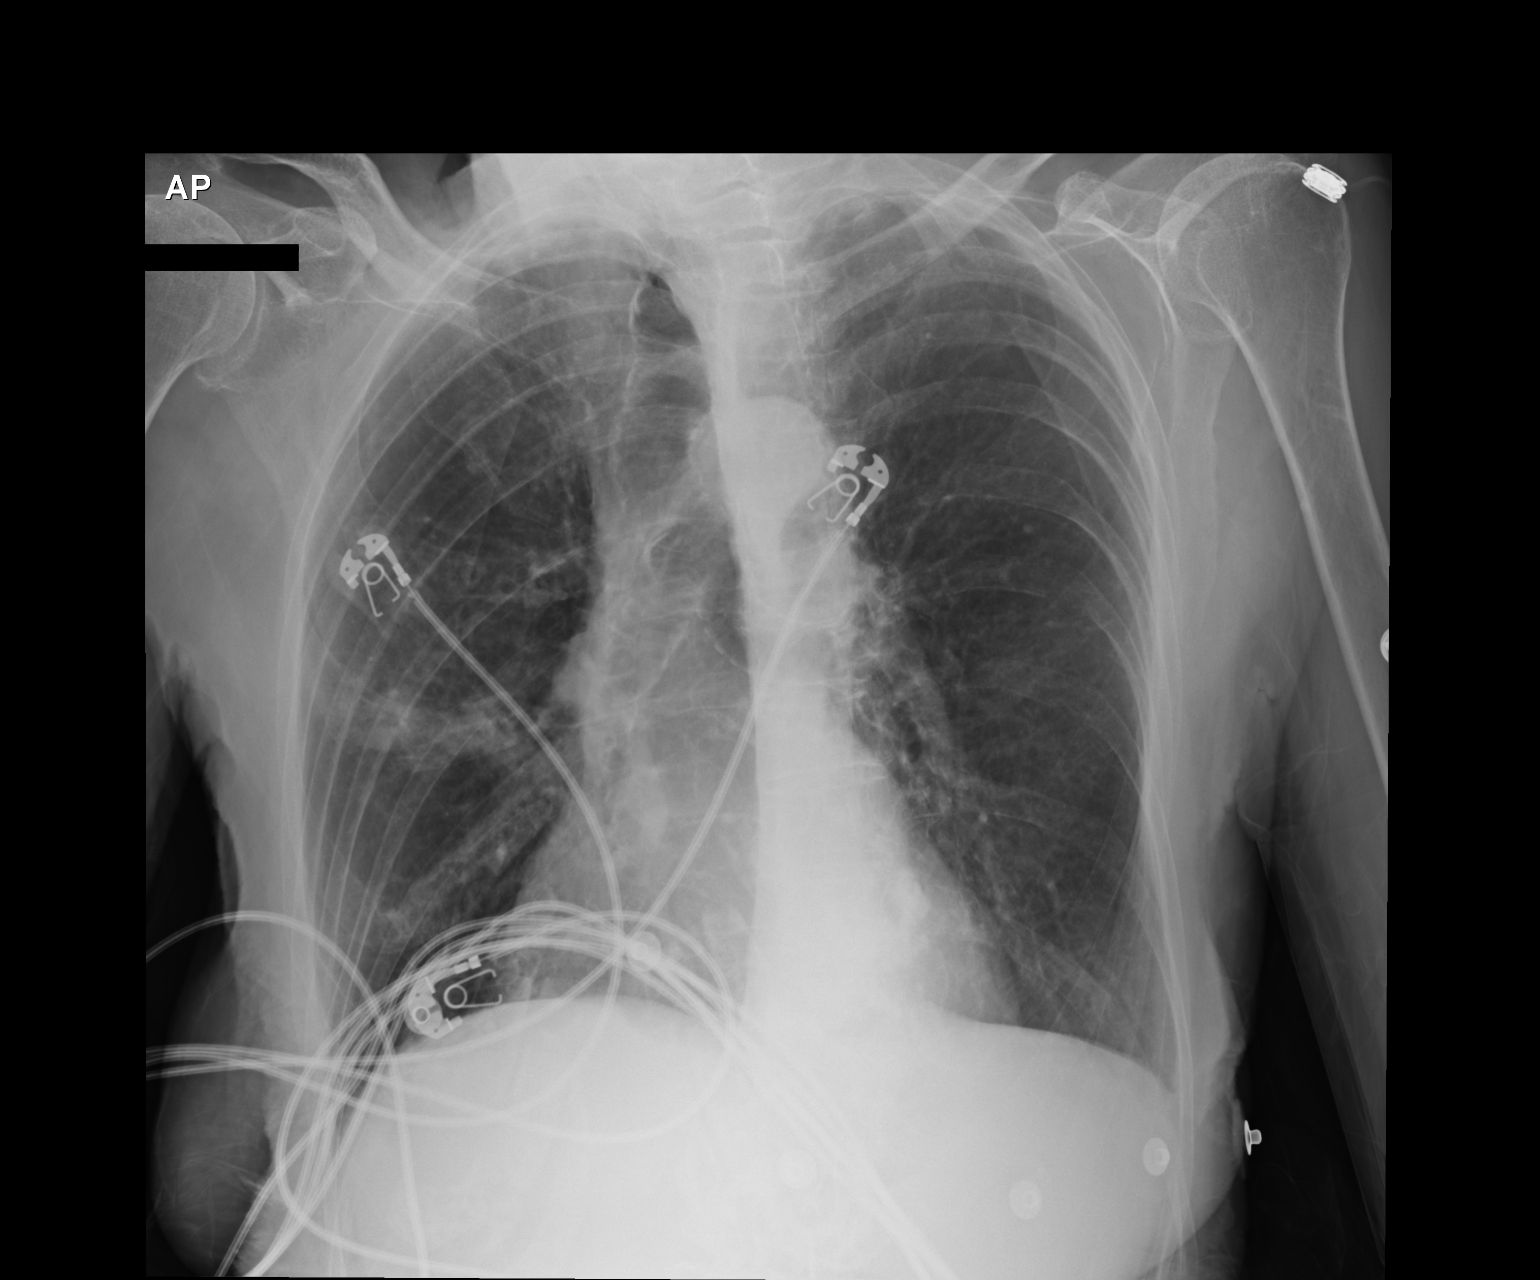

[1 of 1 positions shown; findings below may reference images not displayed]

FINDINGS: Linear opacity in the right mid lung in an area of prior pneumonia
on the chest CT. Lungs otherwise clear. No pleural effusions.
Cardiac silhouette moderately enlarged, unchanged.
IMPRESSION: Linear atelectasis and/or scarring involving the right mid lung. No
acute cardiopulmonary disease otherwise.

## 2017-06-28 IMAGING — CT CT CERVICAL SPINE W/O CM
4 of 7 series · 13 of 33 positions shown, 14 images · IV contrast (agent unspecified)
Comparison: November 24, 2013

CLINICAL DATA: Fall.  Pain.

EXAM:CT HEAD WITHOUT CONTRAST:
EXAM:CT HEAD WITHOUT CONTRAST
CT CERVICAL SPINE WITHOUT CONTRAST
TECHNIQUE: Multidetector CT imaging of the head and cervical spine was
performed following the standard protocol without intravenous
contrast. Multiplanar CT image reconstructions of the cervical spine
were also generated.

[Series 2: head bone · axial · 0.41mm/px · z∈[+798,+906]mm · 4 of 92 slices shown]
[im 19/92  bone]
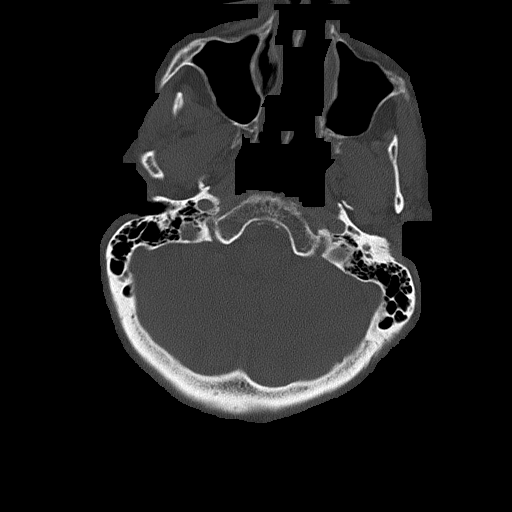
[im 37/92  bone]
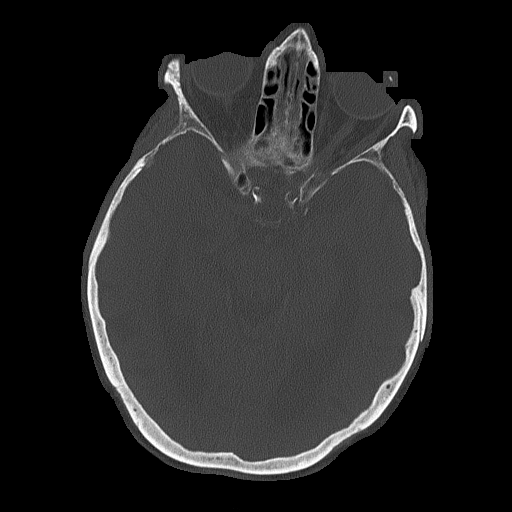
[im 55/92  bone]
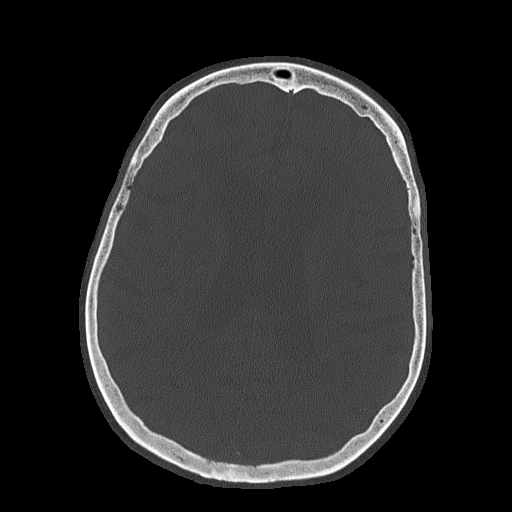
[im 73/92  bone]
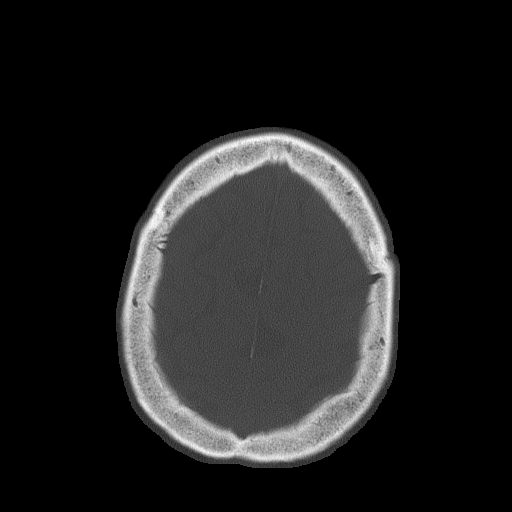

[Series 7: c_spine 2.0 st · axial · 0.33mm/px · z∈[+658,+758]mm · 4 of 84 slices shown, 5 images]
[im 17/84  soft-tissue]
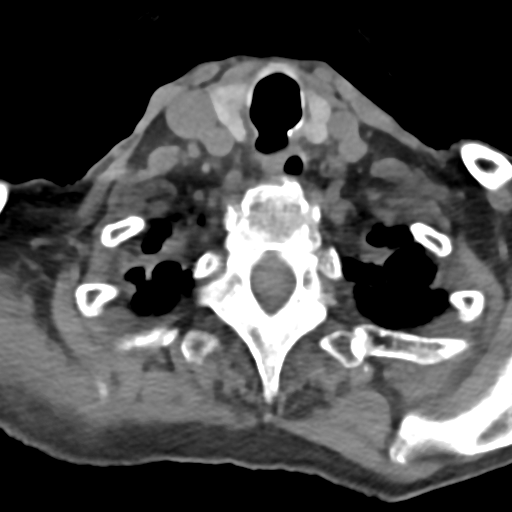
[im 17/84  bone]
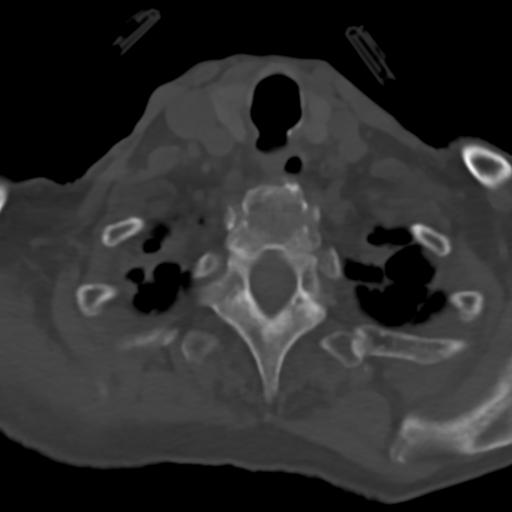
[im 34/84  bone]
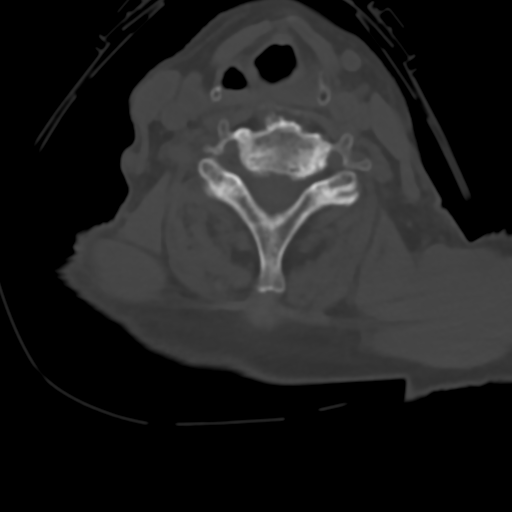
[im 50/84  bone]
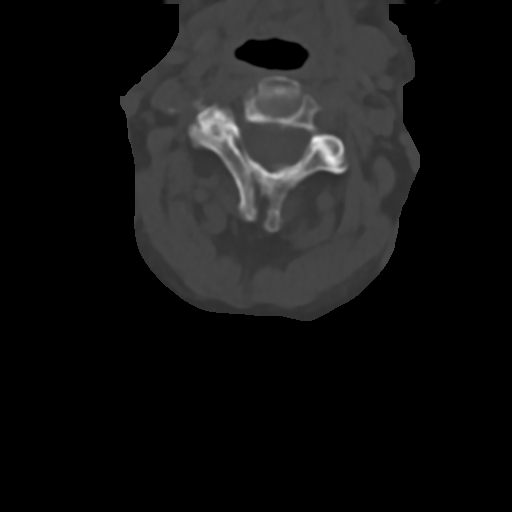
[im 67/84  bone]
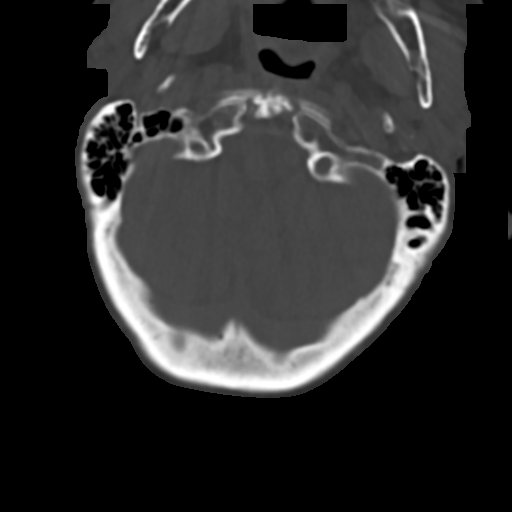

[Series 9: c_spine 2.0 sag bone · sagittal · 0.35mm/px · 4 of 61 slices shown]
[im 13/61  bone]
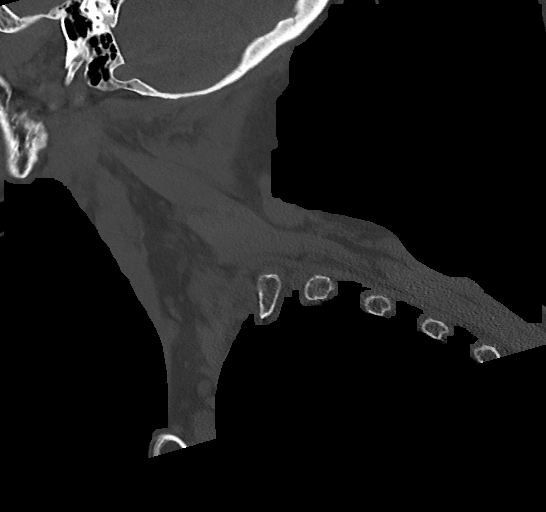
[im 25/61  bone]
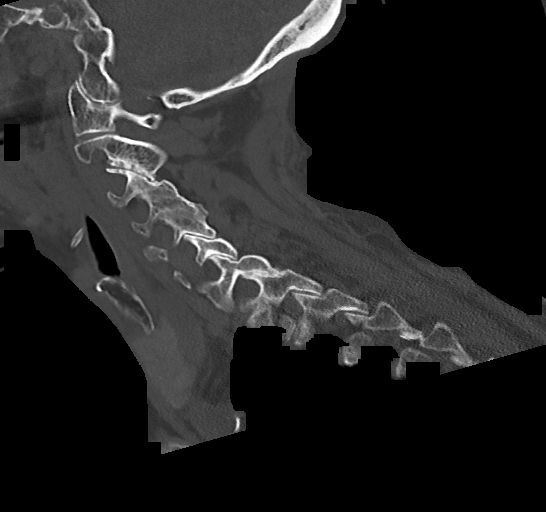
[im 37/61  bone]
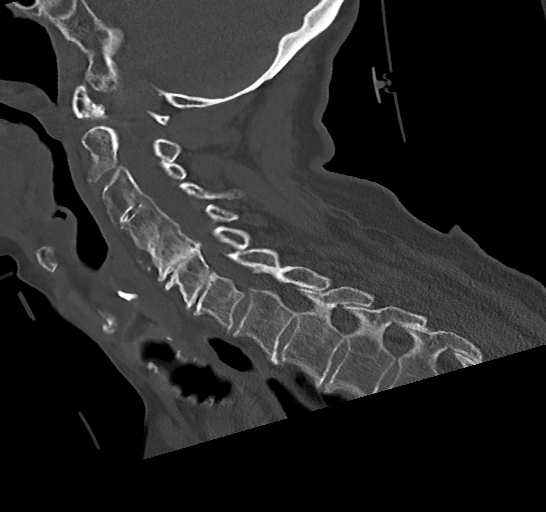
[im 49/61  bone]
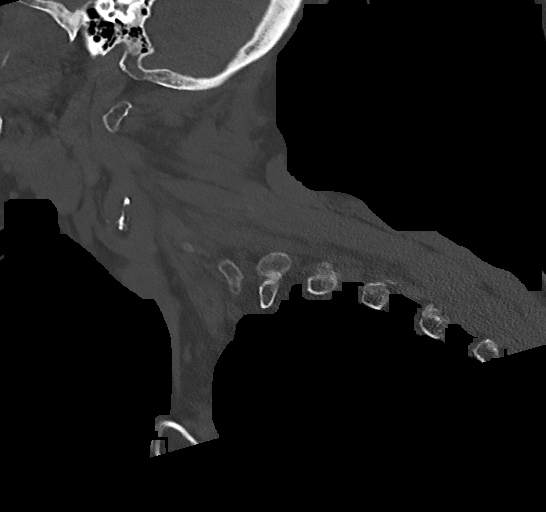

[Series 10: c_spine 2.0 cor bone · coronal · 0.24mm/px · 1 of 61 slices shown]
[im 31/61  bone]
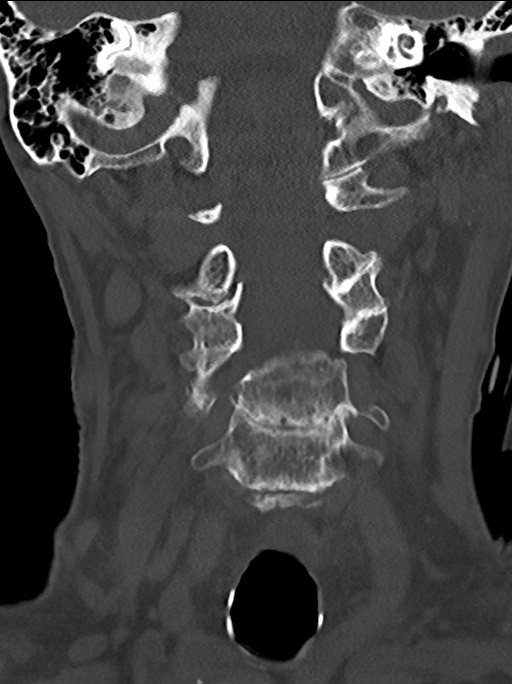

[13 of 33 positions shown; findings below may reference images not displayed]

FINDINGS: CT HEAD FINDINGS

Mild fluid remains in the sphenoid sinus, similar to the comparison
study. The paranasal sinuses, mastoid air cells, and middle ears are
otherwise normal. No bony fractures or soft tissue abnormalities are
identified. No subdural, epidural, or subarachnoid hemorrhage.
Ventricles and sulci are prominent but stable. Moderate to severe
white matter changes are unchanged. No acute cortical ischemia or
infarct. No mass, mass effect, or midline shift. The cerebellum,
brainstem, and basal cisterns are within normal limits.

CT CERVICAL SPINE FINDINGS

There is chronic fusion of C3, C4, and C5. No traumatic
malalignment. No fractures are identified. Multilevel degenerative
changes are noted. Opacities in the posterior lung may apices,
likely scarring.
IMPRESSION: No acute intracranial abnormality. No fracture or malalignment in
the cervical spine.

## 2017-07-31 ENCOUNTER — Telehealth: Payer: Self-pay | Admitting: Cardiovascular Disease

## 2017-07-31 NOTE — Telephone Encounter (Signed)
I think this is her baseline now. I am not sure we have anything to offer given her age. I agree. Gerald Stabs

## 2017-07-31 NOTE — Telephone Encounter (Signed)
Pt calling   Stating she is feeling really bad , tired and weak  Pt c/o Shortness Of Breath: STAT if SOB developed within the last 24 hours or pt is noticeably SOB on the phone  1. Are you currently SOB (can you hear that pt is SOB on the phone)? yes  2. How long have you been experiencing SOB? 3 days  3. Are you SOB when sitting or when up moving around? When she over excretion    4. Are you currently experiencing any other symptoms? Tired,weakness

## 2017-07-31 NOTE — Telephone Encounter (Signed)
I spoke with [name]. She reports she is weak all the time.  She saw Dr Welton Flakes last week for shortness of breath and BNP was a little elevated. Lasix was started but she reports this made her feel bad.  Did not notice increased urine output after starting lasix.  No weight gain, no swelling, no chest pain.  Is having some stomach issues. Main complaint is shortness of breath and weakness.  She did not take lasix today.  I advised her to resume lasix and asked her to contact Dr. Tretha Sciara office for evaluation.  I told her she should go to ED if symptoms worsen.

## 2017-08-09 ENCOUNTER — Encounter (HOSPITAL_COMMUNITY): Payer: Self-pay | Admitting: Emergency Medicine

## 2017-08-09 ENCOUNTER — Emergency Department (HOSPITAL_COMMUNITY)
Admission: EM | Admit: 2017-08-09 | Discharge: 2017-08-09 | Disposition: A | Discharge status: Home / Self Care | Payer: Medicare Other | Attending: Emergency Medicine | Admitting: Emergency Medicine

## 2017-08-09 DIAGNOSIS — I2510 Atherosclerotic heart disease of native coronary artery without angina pectoris: Secondary | ICD-10-CM | POA: Insufficient documentation

## 2017-08-09 DIAGNOSIS — Z7982 Long term (current) use of aspirin: Secondary | ICD-10-CM | POA: Insufficient documentation

## 2017-08-09 DIAGNOSIS — R5383 Other fatigue: Secondary | ICD-10-CM | POA: Insufficient documentation

## 2017-08-09 DIAGNOSIS — Z79899 Other long term (current) drug therapy: Secondary | ICD-10-CM | POA: Insufficient documentation

## 2017-08-09 DIAGNOSIS — I10 Essential (primary) hypertension: Secondary | ICD-10-CM | POA: Diagnosis not present

## 2017-08-09 LAB — BASIC METABOLIC PANEL
ANION GAP: 10 (ref 5–15)
BUN: 25 mg/dL — ABNORMAL HIGH (ref 8–23)
CALCIUM: 9.2 mg/dL (ref 8.9–10.3)
CO2: 24 mmol/L (ref 22–32)
Chloride: 98 mmol/L (ref 98–111)
Creatinine, Ser: 1.23 mg/dL — ABNORMAL HIGH (ref 0.44–1.00)
GFR, EST AFRICAN AMERICAN: 41 mL/min — AB (ref >60)
GFR, EST NON AFRICAN AMERICAN: 35 mL/min — AB (ref >60)
GLUCOSE: 102 mg/dL — AB (ref 70–99)
POTASSIUM: 3.9 mmol/L (ref 3.5–5.1)
SODIUM: 132 mmol/L — AB (ref 135–145)

## 2017-08-09 LAB — CBC
HCT: 38.9 % (ref 36.0–46.0)
HEMOGLOBIN: 12.6 g/dL (ref 12.0–15.0)
MCH: 30.8 pg (ref 26.0–34.0)
MCHC: 32.4 g/dL (ref 30.0–36.0)
MCV: 95.1 fL (ref 78.0–100.0)
Platelets: 102 10*3/uL — ABNORMAL LOW (ref 150–400)
RBC: 4.09 MIL/uL (ref 3.87–5.11)
RDW: 12.2 % (ref 11.5–15.5)
WBC: 5.8 10*3/uL (ref 4.0–10.5)

## 2017-08-09 LAB — URINALYSIS, ROUTINE W REFLEX MICROSCOPIC
BILIRUBIN URINE: NEGATIVE
Glucose, UA: NEGATIVE mg/dL
Hgb urine dipstick: NEGATIVE
Ketones, ur: NEGATIVE mg/dL
LEUKOCYTES UA: NEGATIVE
NITRITE: NEGATIVE
Protein, ur: NEGATIVE mg/dL
SPECIFIC GRAVITY, URINE: 1.011 (ref 1.005–1.030)
pH: 5 (ref 5.0–8.0)

## 2017-08-09 MED ORDER — SODIUM CHLORIDE 0.9 % IV BOLUS
500.0000 mL | Freq: Once | INTRAVENOUS | Status: AC
  Administered 2017-08-09: 500 mL via INTRAVENOUS

## 2017-08-09 NOTE — ED Provider Notes (Signed)
[facility] EMERGENCY DEPARTMENT Provider Note   CSN: [id number] Arrival date & time: 08/09/17  1405     History   Chief Complaint Chief Complaint  Patient presents with  . Fatigue    HPI [name] is a 84 y.o. female with a past medical history of CAD, rectal cancer, who presents to ED for evaluation of ongoing fatigue and dysuria.  Patient has a colostomy bag in place since 2012 and states that she is more prone to UTIs as a result of it.  Her PCP started her on Macrobid for a UTI last week.  However, she believes that the medication is making her feel worse and not feeling any better.  She feels that she is generally weak with low energy and low appetite.  She had one episode of emesis 2 days ago.  Also had one episode of diarrhea today.  Denies any abdominal pain, chest pain, shortness of breath, fever, back pain, injuries or falls.  HPI  Past Medical History:  Diagnosis Date  . Arthritis    FINGERS   . CAD (coronary artery disease) CARDIOLOGIST-- DR Angelena Form   a. s/p INF STEMI 7/12: tx with BMS to RCA;  b. cath 08/26/10: pLAD 30%, mLAD 50%, D1 40%, pCFX 95%, mRCA occluded;   c. staged PCI of pCFX with BMS;   d. echo 7/12:   EF 60-65%, mild RAE, mild to moderate AI, mild MR, moderate TR, RVE, PASP 47  . Complication of anesthesia PT STATES "MADE HER FEEL CRAZY"  . Degeneration of eye    left eye cornea  . Dyspnea   . First degree heart block   . Heart palpitations PAC'S AND SVT RUN'S  PER CARDIOLOGIST NOTE  . History of ST elevation myocardial infarction (STEMI) 08-26-2010-- INFERIOR WALL   S/P PCI  BMS IN RCA AND PROX. CX  . Hyperlipidemia   . Hypertension   . Impaired hearing BILATERAL HEARING AIDS  . Osteopenia   . PAF (paroxysmal atrial fibrillation) ([place])   . PONV (postoperative nausea and vomiting)   . Pulmonary nodules BENIGN  PER CT  10-12-2010  . Rectal Cancer 08/2010   adenocarcinoma   S/P PARTIAL PROCTECTOMY (NO CHEMO OR RADIATION)  .  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 S/P coronary artery stent placement 08/2010   X2  BM    Patient Active Problem List   Diagnosis Date Noted  . Slurred speech 02/23/2017  . PAF (paroxysmal atrial fibrillation) ([place]) 02/23/2017  . Depression with anxiety 02/23/2017  . TIA (transient ischemic attack)   . Fracture, tibia and fibula 07/06/2015  . Tibia/fibula fracture 07/03/2015  . Lung nodule seen on imaging study 09/05/2013  . Exposure to TB 09/05/2013  . SOB (shortness of breath) 10/02/2011  . Vascular skin changes 07/17/2011  . 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 Anorexia post-op 12/08/2010  . HTN (hypertension) 11/03/2010  . Dizziness 09/15/2010  . CAD (coronary artery disease)   . Hyperlipidemia   . Rectal cancer, pT2uN0(pNX) s/p TEM partial proctectomy 09/07/2010    Past Surgical History:  Procedure Laterality Date  . ABDOMINAL HYSTERECTOMY  1950's   AND APPENDECTOMY  . CATARACT EXTRACTION W/ INTRAOCULAR LENS  IMPLANT, BILATERAL    . CORONARY ANGIOPLASTY WITH STENT PLACEMENT  08-26-2010  DR [place]   PCI, BM STENT IN RCA  . CORONARY ANGIOPLASTY WITH STENT PLACEMENT  08-29-2010  DR [place]   PCI, BM STENT IN PROXIMAL CIRCUMFLEX  . EXCISION BENIGN CYST RIGHT BREAST    . FISTULA PLUG N/A 06/21/2012   Procedure: insertion of FISTULA PLUG;  Surgeon: Leighton Ruff, MD;  [facility];  Service: General;  Laterality: N/A;  . FLEXIBLE SIGMOIDOSCOPY N/A 04/02/2012   Procedure: FLEXIBLE SIGMOIDOSCOPY;  Surgeon: Leighton Ruff, MD;  Location: [facility];  Service: Endoscopy;  Laterality: N/A;  . KNEE SURGERY Right 1996  . LAPROSCOPY LYSIS ADHESIONS/ DRAINAGE OF PELVIC ABSCESS/ DIVERTING LOOP SIGMOID COLECTOMY  11-22-2010   POST OP RECTOVAGINAL FISTULA  . PARTIAL PROCTECTOMY BY TEM  11-17-2010   RECTAL CANCER  . RELEASE LEFT CARPAL  TUNNEL/ OSTEOTOMY LEFT DISTAL RADIUS  11-10-2009  . STAPEDECTOMY  1970'S  . TONSILLECTOMY  CHILD  . TOTAL HIP ARTHROPLASTY Right 1992  . TRANSTHORACIC ECHOCARDIOGRAM  10-10-2011   NORMAL LV SIZE WITH MILD FOCAL BASAL SEPTAL HYPERTROPHY/ EF 55-60%/ NORMAL RV SIZE AND LVSF/ BIATRIAL ENLARGEMENT/ MILD TO MODERATE AI  &  TR  . TYMPANOPLASTY Left 12-23-2009     OB History   None      Home Medications    Prior to Admission medications   Medication Sig Start Date End Date Taking? Authorizing Provider  acetaminophen (TYLENOL) 500 MG tablet Take 2 tablets (1,000 mg total) by mouth every 6 (six) hours as needed for mild pain. 07/06/15  Yes Rama, Venetia Maxon, MD  aspirin EC 81 MG tablet Take 81 mg by mouth daily.   Yes [provider]  beta carotene w/minerals (OCUVITE) tablet Take 1 tablet by mouth daily.   Yes [provider]  BIOTIN PO Take 1 tablet by mouth daily.    Yes [provider]  cholecalciferol (VITAMIN D) 1000 units tablet Take 1,000 Units by mouth daily.   Yes [provider]  feeding supplement, ENSURE ENLIVE, (ENSURE ENLIVE) LIQD Take 237 mLs by mouth daily at 3 pm. 07/06/15  Yes Rama, Venetia Maxon, MD  furosemide (LASIX) 20 MG tablet Take 20 mg by mouth daily. 07/25/17  Yes [provider]  metoprolol tartrate (LOPRESSOR) 25 MG tablet TAKE ONE TABLET BY MOUTH TWICE DAILY 05/02/17  Yes Burnell Blanks, MD  nitrofurantoin, macrocrystal-monohydrate, (MACROBID) 100 MG capsule Take 100 mg by mouth 2 (two) times daily. For 7 days 08/06/17 08/13/17 Yes [provider]  nitroGLYCERIN (NITROSTAT) 0.4 MG SL tablet Place 1 tablet (0.4 mg total) under the tongue every 5 (five) minutes as needed for chest pain (MAX 3 TABLETS). 10/31/13  Yes Burnell Blanks, MD  Polyethyl Glycol-Propyl Glycol (SYSTANE) 0.4-0.3 % GEL Place 1 drop into both eyes at bedtime.    Yes [provider]  omeprazole (PRILOSEC) 40 MG capsule Take 40 mg  by mouth as needed.  04/26/11  [provider]    Family History Family History  Problem Relation Age of Onset  . Kidney disease Mother   . Heart disease Father   . Cancer Brother     Social History Social History   Tobacco Use  . Smoking status: Never Smoker  . Smokeless tobacco: Never Used  Substance Use Topics  . Alcohol use: No  . Drug use: No     Allergies   Effexor [venlafaxine hydrochloride]; Penicillins; Sulfa antibiotics; Dicyclomine; and Hydrocodone   Review of Systems Review of Systems  Constitutional: Positive for appetite change and fatigue. Negative for chills and fever.  HENT: Negative for ear pain, rhinorrhea, sneezing and sore  throat.   Eyes: Negative for photophobia and visual disturbance.  Respiratory: Negative for cough, chest tightness, shortness of breath and wheezing.   Cardiovascular: Negative for chest pain and palpitations.  Gastrointestinal: Negative for abdominal pain, blood in stool, constipation, diarrhea, nausea and vomiting.  Genitourinary: Positive for dysuria. Negative for hematuria and urgency.  Musculoskeletal: Negative for myalgias.  Skin: Negative for rash.  Neurological: Negative for dizziness, weakness and light-headedness.     Physical Exam Updated Vital Signs BP (!) 151/64   Pulse 75   Temp 98 F (36.7 C) (Oral)   Resp 19   SpO2 99%   Physical Exam  Constitutional: She appears well-developed and well-nourished. No distress.  HENT:  Head: Normocephalic and atraumatic.  Nose: Nose normal.  Eyes: Conjunctivae and EOM are normal. Right eye exhibits no discharge. Left eye exhibits no discharge. No scleral icterus.  Neck: Normal range of motion. Neck supple.  Cardiovascular: Normal rate, regular rhythm, normal heart sounds and intact distal pulses. Exam reveals no gallop and no friction rub.  No murmur heard. Pulmonary/Chest: Effort normal and breath sounds normal. No respiratory distress.  Abdominal: Soft. Bowel  sounds are normal. She exhibits no distension. There is no tenderness. There is no guarding.  Ostomy bag in place.  No abdominal tenderness to palpation.  No CVA tenderness bilaterally.  Musculoskeletal: Normal range of motion. She exhibits no edema.  Neurological: She is alert. She exhibits normal muscle tone. Coordination normal.  Skin: Skin is warm and dry. No rash noted.  Psychiatric: She has a normal mood and affect.  Nursing note and vitals reviewed.    ED Treatments / Results  Labs (all labs ordered are listed, but only abnormal results are displayed) Labs Reviewed  BASIC METABOLIC PANEL - Abnormal; Notable for the following components:      Result Value   Sodium 132 (*)    Glucose, Bld 102 (*)    BUN 25 (*)    Creatinine, Ser 1.23 (*)    GFR calc non Af Amer 35 (*)    GFR calc Af Amer 41 (*)    All other components within normal limits  CBC - Abnormal; Notable for the following components:   Platelets 102 (*)    All other components within normal limits  URINALYSIS, ROUTINE W REFLEX MICROSCOPIC  CBG MONITORING, ED    EKG None  Radiology No results found.  Procedures Procedures (including critical care time)  Medications Ordered in ED Medications  sodium chloride 0.9 % bolus 500 mL (0 mLs Intravenous Stopped 08/09/17 1831)     Initial Impression / Assessment and Plan / ED Course  I have reviewed the triage vital signs and the nursing notes.  Pertinent labs & imaging results that were available during my care of the patient were reviewed by me and considered in my medical decision making (see chart for details).     84 year old female with a past medical history of CAD, rectal cancer, who presents to ED for evaluation of ongoing fatigue and dysuria.  Her PCP started her on Macrobid for a UTI last week.  She believes that the medication is making her feel worse.  She reports feeling generally weak with low energy and low appetite.  She had one episode of  diarrhea and 2 episodes of emesis.  Denies any abdominal pain, chest pain, shortness of breath, fever, back pain, injuries or falls.  On physical exam she is overall well-appearing.  She has no abdominal tenderness to  palpation.  She has no lower extremity edema noted.  She is ambulating with a normal gait.  Repeat urinalysis done today is negative.  BMP shows increase in creatinine to 1.23 and increase in BUN to 25.  CBC unremarkable.  Patient was given fluids.  I had a discussion with the patient whether she wanted to be admitted for what appears to be an AKI.  She states that she does not want to be admitted for further management.  She would rather follow-up with her primary care provider and increase hydration at home.  I did encourage her to continue and finish the course of Macrobid as directed by her PCP to prevent worsening or recurrence of her infection.  Advised to return to ED for any severe worsening symptoms.  Portions of this note were generated with [employer]obby[employer]. Dictation errors may occur despite best attempts at proofreading.   Final Clinical Impressions(s) / ED Diagnoses   Final diagnoses:  Fatigue, unspecified type    ED Discharge Orders    None       Delia Heady, Hershal Coria 08/09/17 Valarie Cones, MD 08/10/17 1921

## 2017-08-09 NOTE — Discharge Instructions (Signed)
Please follow-up with your primary care provider for further evaluation.  Increase your hydration at home. Return to ED for any worsening symptoms, chest pain, shortness of breath, head injuries or falls, vision changes, numbness in legs.

## 2017-08-09 NOTE — ED Notes (Signed)
E-signature not available, pt verbalized understanding of DC instructions  

## 2017-08-09 NOTE — ED Triage Notes (Signed)
Patient reports "feeling awful" since last week. States she was treated for a UTI last week and her pain and nausea has resolved, she still feels very tired. Patient alert, oriented, and in no apparent distress at this time.

## 2017-08-09 NOTE — ED Provider Notes (Signed)
Patient placed in Quick Look pathway, seen and evaluated   Chief Complaint: weakness, UTI  HPI:   [name] is a 84 y.o. female who present to the ED for blood work and recheck of [place]. Patient reports she went to her PCP last week and had a UTI. She started on mediation which made her feel worse. Her doctor called her today and told her she needed blood work and to come to the ED for evaluation.  ROS: GI: nausea but stopped antibiotic  GU: pain with urination    Physical Exam:  BP (!) 133/45 (BP Location: Right Arm)   Pulse 71   Temp 98 F (36.7 C) (Oral)   Resp 16   SpO2 98%    Gen: No distress  Neuro: Awake and Alert  Skin: Warm and dry     Initiation of care has begun. The patient has been counseled on the process, plan, and necessity for staying for the completion/evaluation, and the remainder of the medical screening examination    Ashley Murrain, NP 08/09/17 1423    Charlesetta Shanks, MD 08/17/17 [phone]

## 2017-09-04 ENCOUNTER — Other Ambulatory Visit: Payer: Self-pay | Admitting: Cardiovascular Disease

## 2017-11-18 NOTE — Progress Notes (Signed)
Chief Complaint  Patient presents with  . Follow-up    CAD   History of Present Illness: 84 yo female with history of CAD, aortic valve insufficiency, mitral regurgitation, rectal cancer and atrial flutter who is here today for cardiac follow up. She was admitted to [facility] July 2012 with an inferior STEMI and was found to have a total occlusion of the proximal RCA which was treated with a bare metal stent. There was moderate residual disease in the mid RCA and severe disease in the proximal Circumflex artery. A bare metal stent was placed in the circumflex artery in a staged procedure. Echo March 2016 with normal LV function, moderate AI, mild MR. She is known to have SVT, PVCs and PACs documented by cardiac monitor in 2013. She was found to have atrial flutter in September 2017. Anti-coagulation was not started given advanced age and fall risk. She has been on ASA and a beta blocker. She was admitted with a probable TIA in January 2018 and has recovered neurologically. She has had ongoing dyspnea for the last few years.  Echo January 2019 with normal LV systolic function, OEVO=35-00%. There was grade 3 diastolic dysfunction and mild to moderate aortic valve insufficiency. She was seen in the ED on 05/12/17 with chest pain. Troponin was negative. She had no ischemic EKG changes. She was not admitted.   She is here today for follow up. The patient denies any chest pain, palpitations, lower extremity edema, orthopnea, PND, dizziness, near syncope or syncope. She has ongoing fatigue and dyspnea with exertion. No change over the past 3 years.   Primary Care Physician: Leonard Downing, MD  Past Medical History:  Diagnosis Date  . Arthritis    FINGERS   . CAD (coronary artery disease) CARDIOLOGIST-- DR Angelena Form   a. s/p INF STEMI 7/12: tx with BMS to RCA;  b. cath 08/26/10: pLAD 30%, mLAD 50%, D1 40%, pCFX 95%, mRCA occluded;   c. staged PCI of pCFX with BMS;   d. echo 7/12:   EF 60-65%, mild RAE,  mild to moderate AI, mild MR, moderate TR, RVE, PASP 47  . Complication of anesthesia PT STATES "MADE HER FEEL CRAZY"  . Degeneration of eye    left eye cornea  . Dyspnea   . First degree heart block   . Heart palpitations PAC'S AND SVT RUN'S  PER CARDIOLOGIST NOTE  . History of ST elevation myocardial infarction (STEMI) 08-26-2010-- INFERIOR WALL   S/P PCI  BMS IN RCA AND PROX. CX  . Hyperlipidemia   . Hypertension   . Impaired hearing BILATERAL HEARING AIDS  . Osteopenia   . PAF (paroxysmal atrial fibrillation) ([place])   . PONV (postoperative nausea and vomiting)   . Pulmonary nodules BENIGN  PER CT  10-12-2010  . Rectal Cancer 08/2010   adenocarcinoma   S/P PARTIAL PROCTECTOMY (NO CHEMO OR RADIATION)  . 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 S/P coronary artery stent placement 08/2010   X2  BM    Past Surgical History:  Procedure Laterality Date  . ABDOMINAL HYSTERECTOMY  1950's   AND APPENDECTOMY  . CATARACT EXTRACTION W/ INTRAOCULAR LENS  IMPLANT, BILATERAL    . CORONARY ANGIOPLASTY WITH STENT PLACEMENT  08-26-2010  DR [place]   PCI, BM STENT IN RCA  . CORONARY ANGIOPLASTY WITH STENT PLACEMENT  08-29-2010  DR [place]   PCI, BM STENT IN PROXIMAL CIRCUMFLEX  .  EXCISION BENIGN CYST RIGHT BREAST    . FISTULA PLUG N/A 06/21/2012   Procedure: insertion of FISTULA PLUG;  Surgeon: Leighton Ruff, MD;  Location: [facility];  Service: General;  Laterality: N/A;  . FLEXIBLE SIGMOIDOSCOPY N/A 04/02/2012   Procedure: FLEXIBLE SIGMOIDOSCOPY;  Surgeon: Leighton Ruff, MD;  Location: [facility];  Service: Endoscopy;  Laterality: N/A;  . KNEE SURGERY Right 1996  . LAPROSCOPY LYSIS ADHESIONS/ DRAINAGE OF PELVIC ABSCESS/ DIVERTING LOOP SIGMOID COLECTOMY  11-22-2010   POST OP RECTOVAGINAL FISTULA  . PARTIAL PROCTECTOMY BY TEM  11-17-2010   RECTAL CANCER  . RELEASE LEFT CARPAL TUNNEL/  OSTEOTOMY LEFT DISTAL RADIUS  11-10-2009  . STAPEDECTOMY  1970'S  . TONSILLECTOMY  CHILD  . TOTAL HIP ARTHROPLASTY Right 1992  . TRANSTHORACIC ECHOCARDIOGRAM  10-10-2011   NORMAL LV SIZE WITH MILD FOCAL BASAL SEPTAL HYPERTROPHY/ EF 55-60%/ NORMAL RV SIZE AND LVSF/ BIATRIAL ENLARGEMENT/ MILD TO MODERATE AI  &  TR  . TYMPANOPLASTY Left 12-23-2009    Current Outpatient Medications  Medication Sig Dispense Refill  . acetaminophen (TYLENOL) 500 MG tablet Take 2 tablets (1,000 mg total) by mouth every 6 (six) hours as needed for mild pain. 30 tablet 0  . aspirin EC 81 MG tablet Take 81 mg by mouth daily.    . beta carotene w/minerals (OCUVITE) tablet Take 1 tablet by mouth daily.    Marland Kitchen BIOTIN PO Take 1 tablet by mouth daily.     . cholecalciferol (VITAMIN D) 1000 units tablet Take 1,000 Units by mouth daily.    . feeding supplement, ENSURE ENLIVE, (ENSURE ENLIVE) LIQD Take 237 mLs by mouth daily at 3 pm. 237 mL 12  . metoprolol tartrate (LOPRESSOR) 25 MG tablet TAKE ONE TABLET BY MOUTH TWICE DAILY 60 tablet 10  . nitroGLYCERIN (NITROSTAT) 0.4 MG SL tablet DISSOLVE ONE TABLET UNDER THE TONGUE EVERY 5 MINUTES AS NEEDED FOR CHEST PAIN.  DO NOT EXCEED A TOTAL OF 3 DOSES IN 15 MINUTES 25 tablet 6  . nystatin-triamcinolone (MYCOLOG II) cream Apply 2 application topically as needed.  0  . Polyethyl Glycol-Propyl Glycol (SYSTANE) 0.4-0.3 % GEL Place 1 drop into both eyes at bedtime.     Marland Kitchen PREMARIN vaginal cream Place 2 application vaginally 2 (two) times a week.  1   No current facility-administered medications for this visit.     Allergies  Allergen Reactions  . Effexor [Venlafaxine Hydrochloride] Other (See Comments)    Reaction not recalled by the patient  . Penicillins Shortness Of Breath    Has patient had a PCN reaction causing immediate rash, facial/tongue/throat swelling, SOB or lightheadedness with hypotension: Yes Has patient had a PCN reaction causing severe rash involving mucus membranes  or skin necrosis: No Has patient had a PCN reaction that required hospitalization: No Has patient had a PCN reaction occurring within the last 10 years: No If all of the above answers are "NO", then may proceed with Cephalosporin use.   . Sulfa Antibiotics Nausea Only  . Dicyclomine Other (See Comments)    Caused confusion  . Hydrocodone Nausea Only    Social History   Socioeconomic History  . Marital status: Widowed    Spouse name: Not on file  . Number of children: Not on file  . Years of education: Not on file  . Highest education level: Not on file  Occupational History  . Not on file  Social Needs  . Financial resource strain: Not on file  . Food insecurity:  Worry: Not on file    Inability: Not on file  . Transportation needs:    Medical: Not on file    Non-medical: Not on file  Tobacco Use  . Smoking status: Never Smoker  . Smokeless tobacco: Never Used  Substance and Sexual Activity  . Alcohol use: No  . Drug use: No  . Sexual activity: Not on file  Lifestyle  . Physical activity:    Days per week: Not on file    Minutes per session: Not on file  . Stress: Not on file  Relationships  . Social connections:    Talks on phone: Not on file    Gets together: Not on file    Attends religious service: Not on file    Active member of club or organization: Not on file    Attends meetings of clubs or organizations: Not on file    Relationship status: Not on file  . Intimate partner violence:    Fear of current or ex partner: Not on file    Emotionally abused: Not on file    Physically abused: Not on file    Forced sexual activity: Not on file  Other Topics Concern  . Not on file  Social History Narrative  . Not on file    Family History  Problem Relation Age of Onset  . Kidney disease Mother   . Heart disease Father   . Cancer Brother     Review of Systems:  As stated in the HPI and otherwise negative.   BP 120/60   Pulse 71   Ht 5\' 3"  (1.6 m)    Wt 110 lb (49.9 kg)   SpO2 98%   BMI 19.49 kg/m   Physical Examination:  General: Well developed, well nourished, NAD  HEENT: OP clear, mucus membranes moist  SKIN: warm, dry. No rashes. Neuro: No focal deficits  Musculoskeletal: Muscle strength 5/5 all ext  Psychiatric: Mood and affect normal  Neck: No JVD, no carotid bruits, no thyromegaly, no lymphadenopathy.  Lungs:Clear bilaterally, no wheezes, rhonci, crackles Cardiovascular: Regular rate and rhythm. No murmurs, gallops or rubs. Abdomen:Soft. Bowel sounds present. Non-tender.  Extremities: No lower extremity edema. Pulses are 2 + in the bilateral DP/PT.  Echo 02/23/17: - Left ventricle: The cavity size was normal. There was moderate   focal basal and mild concentric hypertrophy. Systolic function   was normal. The estimated ejection fraction was in the range of   60% to 65%. Wall motion was normal; there were no regional wall   motion abnormalities. There was a reduced contribution of atrial   contraction to ventricular filling, due to increased ventricular   diastolic pressure or atrial contractile dysfunction. Doppler   parameters are consistent with a reversible restrictive pattern,   indicative of decreased left ventricular diastolic compliance   and/or increased left atrial pressure (grade 3 diastolic   dysfunction). Doppler parameters are consistent with high   ventricular filling pressure. - Aortic valve: There was mild to moderate regurgitation. - Mitral valve: Severely calcified annulus. - Left atrium: The atrium was mildly dilated. - Pulmonic valve: There was trivial regurgitation. - Pulmonary arteries: PA peak pressure: 32 mm Hg (S).  EKG:  EKG is not ordered today. The ekg ordered today demonstrates   Recent Labs: 02/22/2017: ALT 20 02/23/2017: B Natriuretic Peptide 193.9 08/09/2017: BUN 25; Creatinine, Ser 1.23; Hemoglobin 12.6; Platelets 102; Potassium 3.9; Sodium 132   Lipid Panel    Component Value  Date/Time   CHOL  191 02/23/2017 0503   TRIG 77 02/23/2017 0503   HDL 67 02/23/2017 0503   CHOLHDL 2.9 02/23/2017 0503   VLDL 15 02/23/2017 0503   LDLCALC 109 (H) 02/23/2017 0503     Wt Readings from Last 3 Encounters:  11/19/17 110 lb (49.9 kg)  05/25/17 112 lb 6.4 oz (51 kg)  05/12/17 112 lb (50.8 kg)     Other studies Reviewed: Additional studies/ records that were reviewed today include: . Review of the above records demonstrates:    Assessment and Plan:   1. CAD without angina: No chest pain. Ongoing dyspnea and fatigue but she is not felt to be a good candidate for cardiac cath given advanced age. Will continue ASA and beta blocker. We discussed stopping her beta blocker due to her fatigue but she does not wish to do this.     2. Atrial flutter, paroxysmal: Sinus today. No anti-coagulation given advanced age. Continue beta blocker.     3. HTN: BP controlled. No changes  4. Aortic insufficiency: Mild to moderate AI by echo January 2019. No change since 2016.     5. Dyspnea: Unclear etiology. Her lungs are clear on exam. No lower ext edema. No evidence of volume overload. This is not due to her valve disease as it is only mild to moderate. This could be related to CAD but she is not a cath candidate given frailty and advanced age. Emphysema seen on chest x-ray. I suspect she has some underlying lung disease. No further workup at this time.     Current medicines are reviewed at length with the patient today.  The patient does not have concerns regarding medicines.  The following changes have been made:  no change  Labs/ tests ordered today include:  No orders of the defined types were placed in this encounter.   Disposition:   FU with me in 6 months  Signed, Lauree Chandler, MD 11/19/2017 4:55 PM    [facility] [address], [place], [place]  [address] Phone: [phone]; Fax: [phone]

## 2017-11-19 ENCOUNTER — Encounter: Payer: Self-pay | Admitting: Cardiovascular Disease

## 2017-11-19 ENCOUNTER — Ambulatory Visit (INDEPENDENT_AMBULATORY_CARE_PROVIDER_SITE_OTHER): Payer: Medicare Other | Admitting: Cardiovascular Disease

## 2017-11-19 VITALS — BP 120/60 | HR 71 | Ht 63.0 in | Wt 110.0 lb

## 2017-11-19 DIAGNOSIS — I2510 Atherosclerotic heart disease of native coronary artery without angina pectoris: Secondary | ICD-10-CM

## 2017-11-19 DIAGNOSIS — I483 Typical atrial flutter: Secondary | ICD-10-CM

## 2017-11-19 DIAGNOSIS — I10 Essential (primary) hypertension: Secondary | ICD-10-CM | POA: Diagnosis not present

## 2017-11-19 DIAGNOSIS — I351 Nonrheumatic aortic (valve) insufficiency: Secondary | ICD-10-CM

## 2017-11-19 DIAGNOSIS — R0609 Other forms of dyspnea: Secondary | ICD-10-CM

## 2017-11-19 NOTE — Patient Instructions (Signed)

## 2018-03-13 ENCOUNTER — Telehealth: Payer: Self-pay | Admitting: Cardiovascular Disease

## 2018-03-13 MED ORDER — METOPROLOL TARTRATE 25 MG PO TABS: 25.0000 mg | ORAL_TABLET | Freq: Two times a day (BID) | ORAL | 10 refills | Status: DC

## 2018-03-13 NOTE — Addendum Note (Signed)
Addended by: Sarina Ill on: 03/13/2018 10:17 AM   Modules accepted: Orders

## 2018-03-13 NOTE — Telephone Encounter (Signed)
Pt c/o Shortness Of Breath: STAT if SOB developed within the last 24 hours or pt is noticeably SOB on the phone  1. Are you currently SOB (can you hear that pt is SOB on the phone)? no  2. How long have you been experiencing SOB? For sometime, been worse the last 3-4 weeks  3. Are you SOB when sitting or when up moving around? Moving around, sometimes when she is sitting as well.   4. Are you currently experiencing any other symptoms? She feels she can't do hardly anything.  She has an appt with Dr. Gonzella Lex in April, but she feels she needs to be seen soon.

## 2018-03-13 NOTE — Telephone Encounter (Signed)
The patient requested a sooner appointment, because she had a atrial flutter event this morning at 3 am. She woke up due to the fluttering and she went back to sleep around 6 am. She has not had anymore symptoms today. She stated she has felt fluttering about 1-2 times a week. She also stated that he SOB on exertion is a little worse than before. She said she will change her clothing in the morning and need to rest. She also can make it across the room, but then will need to stop to catch her breath. She wanted to know if there were ways she could increase her strength and reduce her SOB. Sending to Dr. Angelena Form.

## 2018-03-13 NOTE — Telephone Encounter (Signed)
Spoke with the patient, she expressed understanding about taking an extra lopressor when she has another fluttering event. She had no further questions.

## 2018-03-13 NOTE — Telephone Encounter (Signed)
She will be 89 this spring. She has baseline dyspnea and fatigue. I don't have any good ideas. I would ask her to take an extra Lopressor at times when she feels that she has flutter and her heart rate is elevated. Gerald Stabs

## 2018-03-20 ENCOUNTER — Telehealth: Payer: Self-pay | Admitting: Cardiovascular Disease

## 2018-03-20 NOTE — Telephone Encounter (Signed)
Agree. thanks

## 2018-03-20 NOTE — Telephone Encounter (Signed)
Patient c/o Palpitations:  High priority if patient c/o lightheadedness, shortness of breath, or chest pain  1) How long have you had palpitations/irregular HR/ Afib? Are you having the symptoms now? About 5 days and yes patient having them.  2) Are you currently experiencing lightheadedness, SOB or CP? SOB  3) Do you have a history of afib (atrial fibrillation) or irregular heart rhythm? Yes.  4) Have you checked your BP or HR? (document readings if available) No  5) Are you experiencing any other symptoms? No

## 2018-03-20 NOTE — Telephone Encounter (Signed)
Pt called to report that she had an episode Sunday night and again last night of worsening palpitaions and she felt her heart racing and she was very scared and did not feel well.. she denies dizziness, SOB, but it really scares her when her heart does that... she took the extra Lopressor 25mg  last night and it did give her relief after 30 minutes and her normal dose this morning.. but she is very tired today and I advised her that the Lopressor can make her feel tired.. I recommended that she rests today and make sure she is not having any added caffeine and to be sure that she is eating well.. she verbalized understanding but requests to come and see Dr.Mcalhany sooner than her next appt 05/2018... I offered an appt with an APP but she declined so we scheduled her for 03/25/18 and to call EMS if she has any worsening symptoms and develops dizziness or presyncope.. pt agrees.

## 2018-03-20 NOTE — Telephone Encounter (Signed)
Primary nurse not available. Will route to Triage for further evaluation.

## 2018-03-25 ENCOUNTER — Encounter: Payer: Self-pay | Admitting: Cardiovascular Disease

## 2018-03-25 ENCOUNTER — Ambulatory Visit: Payer: Medicare Other | Admitting: Cardiovascular Disease

## 2018-03-25 VITALS — BP 158/60 | HR 72 | Ht 63.0 in | Wt 111.8 lb

## 2018-03-25 DIAGNOSIS — I351 Nonrheumatic aortic (valve) insufficiency: Secondary | ICD-10-CM

## 2018-03-25 DIAGNOSIS — I483 Typical atrial flutter: Secondary | ICD-10-CM

## 2018-03-25 DIAGNOSIS — I10 Essential (primary) hypertension: Secondary | ICD-10-CM

## 2018-03-25 DIAGNOSIS — I2510 Atherosclerotic heart disease of native coronary artery without angina pectoris: Secondary | ICD-10-CM | POA: Diagnosis not present

## 2018-03-25 DIAGNOSIS — R0609 Other forms of dyspnea: Secondary | ICD-10-CM

## 2018-03-25 MED ORDER — METOPROLOL TARTRATE 25 MG PO TABS: 12.5000 mg | ORAL_TABLET | Freq: Three times a day (TID) | ORAL | 3 refills | Status: DC

## 2018-03-25 NOTE — Progress Notes (Signed)
Chief Complaint  Patient presents with  . Follow-up    CAD   History of Present Illness: 85 yo female with history of CAD, aortic valve insufficiency, mitral regurgitation, rectal cancer and atrial flutter who is here today for cardiac follow up. She was admitted to [facility] July 2012 with an inferior STEMI and was found to have a total occlusion of the proximal RCA which was treated with a bare metal stent. There was moderate residual disease in the mid RCA and severe disease in the proximal Circumflex artery. A bare metal stent was placed in the circumflex artery in a staged procedure. Echo March 2016 with normal LV function, moderate AI, mild MR. She is known to have SVT, PVCs and PACs documented by cardiac monitor in 2013. She was found to have atrial flutter in September 2017. Anti-coagulation was not started given advanced age and fall risk. She has been on ASA and a beta blocker. She was admitted with a probable TIA in January 2018 and has recovered neurologically. She has had ongoing dyspnea for the last few years.  Echo January 2019 with normal LV systolic function, CXKG=81-85%. There was grade 3 diastolic dysfunction and mild to moderate aortic valve insufficiency with trivial MR.She was seen in the ED on 05/12/17 with chest pain. Troponin was negative. She had no ischemic EKG changes. She was not admitted. I saw her in October 2019 and she reported ongoing fatigue and dyspnea but no chest pain.   She is here today for follow up. She is added onto my schedule today after she called in with c/o worsened dyspnea and palpitations. She felt her heart racing two nights over the past week. Each episode lasted for up to 30 minutes. The patient denies any chest pain, lower extremity edema, orthopnea, PND, dizziness, near syncope or syncope. She has continued dyspnea with exertion. This has been present for years. Weight is stable at home.   Primary Care Physician: Leonard Downing, MD  Past Medical  History:  Diagnosis Date  . Arthritis    FINGERS   . CAD (coronary artery disease) CARDIOLOGIST-- DR Angelena Form   a. s/p INF STEMI 7/12: tx with BMS to RCA;  b. cath 08/26/10: pLAD 30%, mLAD 50%, D1 40%, pCFX 95%, mRCA occluded;   c. staged PCI of pCFX with BMS;   d. echo 7/12:   EF 60-65%, mild RAE, mild to moderate AI, mild MR, moderate TR, RVE, PASP 47  . Complication of anesthesia PT STATES "MADE HER FEEL CRAZY"  . Degeneration of eye    left eye cornea  . Dyspnea   . First degree heart block   . Heart palpitations PAC'S AND SVT RUN'S  PER CARDIOLOGIST NOTE  . History of ST elevation myocardial infarction (STEMI) 08-26-2010-- INFERIOR WALL   S/P PCI  BMS IN RCA AND PROX. CX  . Hyperlipidemia   . Hypertension   . Impaired hearing BILATERAL HEARING AIDS  . Osteopenia   . PAF (paroxysmal atrial fibrillation) ([place])   . PONV (postoperative nausea and vomiting)   . Pulmonary nodules BENIGN  PER CT  10-12-2010  . Rectal Cancer 08/2010   adenocarcinoma   S/P PARTIAL PROCTECTOMY (NO CHEMO OR RADIATION)  . 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 S/P coronary artery stent placement 08/2010   X2  BM    Past Surgical History:  Procedure Laterality Date  . ABDOMINAL HYSTERECTOMY  1950's  AND APPENDECTOMY  . CATARACT EXTRACTION W/ INTRAOCULAR LENS  IMPLANT, BILATERAL    . CORONARY ANGIOPLASTY WITH STENT PLACEMENT  08-26-2010  DR [place]   PCI, BM STENT IN RCA  . CORONARY ANGIOPLASTY WITH STENT PLACEMENT  08-29-2010  DR [place]   PCI, BM STENT IN PROXIMAL CIRCUMFLEX  . EXCISION BENIGN CYST RIGHT BREAST    . FISTULA PLUG N/A 06/21/2012   Procedure: insertion of FISTULA PLUG;  Surgeon: Leighton Ruff, MD;  Location: [facility];  Service: General;  Laterality: N/A;  . FLEXIBLE SIGMOIDOSCOPY N/A 04/02/2012   Procedure: FLEXIBLE SIGMOIDOSCOPY;  Surgeon: Leighton Ruff, MD;  Location: [facility];   Service: Endoscopy;  Laterality: N/A;  . KNEE SURGERY Right 1996  . LAPROSCOPY LYSIS ADHESIONS/ DRAINAGE OF PELVIC ABSCESS/ DIVERTING LOOP SIGMOID COLECTOMY  11-22-2010   POST OP RECTOVAGINAL FISTULA  . PARTIAL PROCTECTOMY BY TEM  11-17-2010   RECTAL CANCER  . RELEASE LEFT CARPAL TUNNEL/ OSTEOTOMY LEFT DISTAL RADIUS  11-10-2009  . STAPEDECTOMY  1970'S  . TONSILLECTOMY  CHILD  . TOTAL HIP ARTHROPLASTY Right 1992  . TRANSTHORACIC ECHOCARDIOGRAM  10-10-2011   NORMAL LV SIZE WITH MILD FOCAL BASAL SEPTAL HYPERTROPHY/ EF 55-60%/ NORMAL RV SIZE AND LVSF/ BIATRIAL ENLARGEMENT/ MILD TO MODERATE AI  &  TR  . TYMPANOPLASTY Left 12-23-2009    Current Outpatient Medications  Medication Sig Dispense Refill  . acetaminophen (TYLENOL) 500 MG tablet Take 2 tablets (1,000 mg total) by mouth every 6 (six) hours as needed for mild pain. 30 tablet 0  . aspirin EC 81 MG tablet Take 81 mg by mouth daily.    . beta carotene w/minerals (OCUVITE) tablet Take 1 tablet by mouth daily.    . cholecalciferol (VITAMIN D) 1000 units tablet Take 1,000 Units by mouth daily.    . feeding supplement, ENSURE ENLIVE, (ENSURE ENLIVE) LIQD Take 237 mLs by mouth daily at 3 pm. 237 mL 12  . nitroGLYCERIN (NITROSTAT) 0.4 MG SL tablet DISSOLVE ONE TABLET UNDER THE TONGUE EVERY 5 MINUTES AS NEEDED FOR CHEST PAIN.  DO NOT EXCEED A TOTAL OF 3 DOSES IN 15 MINUTES 25 tablet 6  . nystatin-triamcinolone (MYCOLOG II) cream Apply 2 application topically as needed.  0  . Polyethyl Glycol-Propyl Glycol (SYSTANE) 0.4-0.3 % GEL Place 1 drop into both eyes at bedtime.     Marland Kitchen PREMARIN vaginal cream Place 2 application vaginally 2 (two) times a week.  1  . BIOTIN PO Take 1 tablet by mouth daily.     . metoprolol tartrate (LOPRESSOR) 25 MG tablet Take 0.5 tablets (12.5 mg total) by mouth 3 (three) times daily. 135 tablet 3   No current facility-administered medications for this visit.     Allergies  Allergen Reactions  . Effexor [Venlafaxine  Hydrochloride] Other (See Comments)    Reaction not recalled by the patient  . Penicillins Shortness Of Breath    Has patient had a PCN reaction causing immediate rash, facial/tongue/throat swelling, SOB or lightheadedness with hypotension: Yes Has patient had a PCN reaction causing severe rash involving mucus membranes or skin necrosis: No Has patient had a PCN reaction that required hospitalization: No Has patient had a PCN reaction occurring within the last 10 years: No If all of the above answers are "NO", then may proceed with Cephalosporin use.   . Sulfa Antibiotics Nausea Only  . Dicyclomine Other (See Comments)    Caused confusion  . Hydrocodone Nausea Only    Social History   Socioeconomic History  .  Marital status: Widowed    Spouse name: Not on file  . Number of children: Not on file  . Years of education: Not on file  . Highest education level: Not on file  Occupational History  . Not on file  Social Needs  . Financial resource strain: Not on file  . Food insecurity:    Worry: Not on file    Inability: Not on file  . Transportation needs:    Medical: Not on file    Non-medical: Not on file  Tobacco Use  . Smoking status: Never Smoker  . Smokeless tobacco: Never Used  Substance and Sexual Activity  . Alcohol use: No  . Drug use: No  . Sexual activity: Not on file  Lifestyle  . Physical activity:    Days per week: Not on file    Minutes per session: Not on file  . Stress: Not on file  Relationships  . Social connections:    Talks on phone: Not on file    Gets together: Not on file    Attends religious service: Not on file    Active member of club or organization: Not on file    Attends meetings of clubs or organizations: Not on file    Relationship status: Not on file  . Intimate partner violence:    Fear of current or ex partner: Not on file    Emotionally abused: Not on file    Physically abused: Not on file    Forced sexual activity: Not on file   Other Topics Concern  . Not on file  Social History Narrative  . Not on file    Family History  Problem Relation Age of Onset  . Kidney disease Mother   . Heart disease Father   . Cancer Brother     Review of Systems:  As stated in the HPI and otherwise negative.   BP (!) 158/60   Pulse 72   Ht 5\' 3"  (1.6 m)   Wt 111 lb 12.8 oz (50.7 kg)   SpO2 95%   BMI 19.80 kg/m   Physical Examination:  General: Well developed, well nourished, NAD  HEENT: OP clear, mucus membranes moist  SKIN: warm, dry. No rashes. Neuro: No focal deficits  Musculoskeletal: Muscle strength 5/5 all ext  Psychiatric: Mood and affect normal  Neck: No JVD, no carotid bruits, no thyromegaly, no lymphadenopathy.  Lungs:Clear bilaterally, no wheezes, rhonci, crackles Cardiovascular: Regular rate and rhythm. No murmur.  Abdomen:Soft. Bowel sounds present. Non-tender.  Extremities: No lower extremity edema. Pulses are 2 + in the bilateral DP/PT.  Echo 02/23/17: - Left ventricle: The cavity size was normal. There was moderate   focal basal and mild concentric hypertrophy. Systolic function   was normal. The estimated ejection fraction was in the range of   60% to 65%. Wall motion was normal; there were no regional wall   motion abnormalities. There was a reduced contribution of atrial   contraction to ventricular filling, due to increased ventricular   diastolic pressure or atrial contractile dysfunction. Doppler   parameters are consistent with a reversible restrictive pattern,   indicative of decreased left ventricular diastolic compliance   and/or increased left atrial pressure (grade 3 diastolic   dysfunction). Doppler parameters are consistent with high   ventricular filling pressure. - Aortic valve: There was mild to moderate regurgitation. - Mitral valve: Severely calcified annulus. - Left atrium: The atrium was mildly dilated. - Pulmonic valve: There was trivial regurgitation. -  Pulmonary  arteries: PA peak pressure: 32 mm Hg (S).  EKG:  EKG is ordered today. The ekg ordered today demonstrates Sinus rhythm, rate 72 bpm. 1st degree AV block. Poor R wave progression precordial leads.   Recent Labs: 08/09/2017: BUN 25; Creatinine, Ser 1.23; Hemoglobin 12.6; Platelets 102; Potassium 3.9; Sodium 132   Lipid Panel    Component Value Date/Time   CHOL 191 02/23/2017 0503   TRIG 77 02/23/2017 0503   HDL 67 02/23/2017 0503   CHOLHDL 2.9 02/23/2017 0503   VLDL 15 02/23/2017 0503   LDLCALC 109 (H) 02/23/2017 0503     Wt Readings from Last 3 Encounters:  03/25/18 111 lb 12.8 oz (50.7 kg)  11/19/17 110 lb (49.9 kg)  05/25/17 112 lb 6.4 oz (51 kg)     Other studies Reviewed: Additional studies/ records that were reviewed today include: . Review of the above records demonstrates:    Assessment and Plan:   1. CAD without angina: She has baseline dyspnea and fatigue but she is not felt to be a good candidate for cardiac cath given advanced age. She has no chest pain. Will continue ASA and beta blocker.      2. Atrial flutter, paroxysmal: She is in sinus today. We have elected not to start anti-coagulation given her advanced age. She is having more frequent episodes of fluttering. Will increase Lopressor to 12. 5 mg po TID. She is worried about a higher dose so will not increase beyond this.  She will take an extra 12.5 mg of Lopressor as needed for episodes of tachycardia and fluttering.   3. HTN: BP is controlled at home.   4. Aortic insufficiency: Mild to moderate AI by echo January 2019. No change since 2016.  I do not think this is contributing to her dyspnea.   5. Dyspnea: This has been persistent for years. Lungs are clear. No LE edema. Her weight is stable. Her aortic valve insufficiency is mild to moderate and likely not contributory. Her dyspnea could be due to her CAD but she is not felt to be a cath candidate given her age. ? COPD.      Current medicines are reviewed  at length with the patient today.  The patient does not have concerns regarding medicines.  The following changes have been made:  no change  Labs/ tests ordered today include:  No orders of the defined types were placed in this encounter.   Disposition:   FU with me in 6 months  Signed, Lauree Chandler, MD 03/25/2018 [facility] [address], [place], [place]  [address] Phone: [phone]; Fax: [phone]

## 2018-03-25 NOTE — Patient Instructions (Signed)
Medication Instructions:  Your physician has recommended you make the following change in your medication: Increase metoprolol tartrate to 12.5 mg by mouth three times daily.   If you need a refill on your cardiac medications before your next appointment, please call your pharmacy.   Lab work: none If you have labs (blood work) drawn today and your tests are completely normal, you will receive your results only by: Marland Kitchen MyChart Message (if you have MyChart) OR . A paper copy in the mail If you have any lab test that is abnormal or we need to change your treatment, we will call you to review the results.  Testing/Procedures: none  Follow-Up: At [facility], you and your health needs are our priority.  As part of our continuing mission to provide you with exceptional heart care, we have created designated Provider Care Teams.  These Care Teams include your primary Cardiologist (physician) and Advanced Practice Providers (APPs -  Physician Assistants and Nurse Practitioners) who all work together to provide you with the care you need, when you need it. You will need a follow up appointment in 6 months.  Please call our office 4 months in advance to schedule this appointment.  You may see Lauree Chandler, MD or one of the following Advanced Practice Providers on your designated Care Team:   Chance, PA-C Melina Copa, PA-C . Ermalinda Barrios, PA-C  Any Other Special Instructions Will Be Listed Below (If Applicable).

## 2018-03-27 NOTE — Addendum Note (Signed)
Addended by: Mendel Ryder on: 03/27/2018 08:29 AM   Modules accepted: Orders

## 2018-05-30 ENCOUNTER — Ambulatory Visit: Payer: Medicare Other | Admitting: Cardiovascular Disease

## 2018-06-03 ENCOUNTER — Telehealth: Payer: Self-pay | Admitting: Cardiovascular Disease

## 2018-06-03 NOTE — Telephone Encounter (Signed)
  Patient would like to discuss an episode that she had Saturday night. She started having a light pain on her left arm for a couple hours and she did call EMS and she took nitroglycerin. She did not go to the hospital. Later after she went to bed she started having the pain again and it lasted about 45 min.

## 2018-06-03 NOTE — Telephone Encounter (Signed)
No recommendations. She will be 89 next month. She is not a candidate for invasive cardiac testing. Thanks, chris

## 2018-06-03 NOTE — Telephone Encounter (Signed)
Pt called to report that she had left arm pain after a long busy day on Saturday. She took a Nitro, but was anxious so she called EMS. They said her EKG looked "normal" and had her take another Nitro and 4 aspirin, and she said the pain was better after 30 minutes. She declined going to the ER.   Saturday night the pain returned in her arm, and she laid in bed for about 45 minutes and it went away on its own.   She denies radiation to her chest and no other symptoms such as SOB, dizziness, and no physical activities that could cause musculoskeletal pain.   She says she has been fine since then. She does not know her BP, just feels more tired than usual.   Will forward to Dr. Angelena Form. Pt to call back if pain returns in the meantime.

## 2018-06-03 NOTE — Telephone Encounter (Signed)
Pt advised and will continue to rest and monitor her symptoms. Says she is doing well today.

## 2018-06-14 ENCOUNTER — Telehealth: Payer: Self-pay

## 2018-06-14 NOTE — Telephone Encounter (Signed)
Per Pt's PCP, pt has been switched from Metorprolol to Diltiazem 120 mg once a day. Dr. Angelena Form is aware of change.

## 2018-07-11 ENCOUNTER — Telehealth: Payer: Self-pay | Admitting: Cardiovascular Disease

## 2018-07-11 NOTE — Telephone Encounter (Signed)
Pt reports she frequently has bad dreams but they have been worse in the last 3-4 months. Primary care changed her lopressor to Cardizem to see if this would help. Pt was off lopressor for 2 weeks but dreams continued.  She has since gone back to taking lopressor. She states primary care told her there was another medicine she could try but she was hesitant to make change.  I told pt it did not sound like dreams were related to lopressor as they did not improve when she stopped it.  I asked her to follow up with primary care regarding the dreams. Will review with Dr. Angelena Form regarding church attendance.

## 2018-07-11 NOTE — Telephone Encounter (Signed)
She wants to know if she can go to church with social distancing in place.  So also is wondering if she can take something else besides metoprolol tartrate (LOPRESSOR) 25 MG tablet [[id number]] she states she has been having really bad dreams and her doctor said it could be the medication that is causing them.

## 2018-07-12 NOTE — Telephone Encounter (Signed)
Agree. At age 85, she should enjoy life but practice social distancing as best she can. Gerald Stabs

## 2018-07-12 NOTE — Telephone Encounter (Signed)
I spoke with pt and gave her information from Dr. Angelena Form.  I talked with her about the importance of social distancing, mask wearing and hand washing.

## 2018-07-18 ENCOUNTER — Emergency Department (HOSPITAL_COMMUNITY): Payer: Medicare Other

## 2018-07-18 ENCOUNTER — Telehealth: Payer: Self-pay | Admitting: Cardiovascular Disease

## 2018-07-18 ENCOUNTER — Other Ambulatory Visit: Payer: Self-pay

## 2018-07-18 ENCOUNTER — Emergency Department (HOSPITAL_COMMUNITY)
Admission: EM | Admit: 2018-07-18 | Discharge: 2018-07-18 | Disposition: A | Discharge status: Home / Self Care | Payer: Medicare Other | Attending: Emergency Medicine | Admitting: Emergency Medicine

## 2018-07-18 DIAGNOSIS — I10 Essential (primary) hypertension: Secondary | ICD-10-CM | POA: Diagnosis not present

## 2018-07-18 DIAGNOSIS — R0789 Other chest pain: Secondary | ICD-10-CM | POA: Insufficient documentation

## 2018-07-18 DIAGNOSIS — I2510 Atherosclerotic heart disease of native coronary artery without angina pectoris: Secondary | ICD-10-CM | POA: Diagnosis not present

## 2018-07-18 DIAGNOSIS — I252 Old myocardial infarction: Secondary | ICD-10-CM | POA: Diagnosis not present

## 2018-07-18 DIAGNOSIS — Z85040 Personal history of malignant carcinoid tumor of rectum: Secondary | ICD-10-CM | POA: Insufficient documentation

## 2018-07-18 DIAGNOSIS — R079 Chest pain, unspecified: Secondary | ICD-10-CM

## 2018-07-18 LAB — TROPONIN I
Troponin I: <0.03 ng/mL (ref <0.03)
Troponin I: <0.03 ng/mL (ref <0.03)

## 2018-07-18 LAB — CBC
HCT: 37.4 % (ref 36.0–46.0)
Hemoglobin: 12 g/dL (ref 12.0–15.0)
MCH: 31.5 pg (ref 26.0–34.0)
MCHC: 32.1 g/dL (ref 30.0–36.0)
MCV: 98.2 fL (ref 80.0–100.0)
Platelets: 173 10*3/uL (ref 150–400)
RBC: 3.81 MIL/uL — ABNORMAL LOW (ref 3.87–5.11)
RDW: 12.5 % (ref 11.5–15.5)
WBC: 8.3 10*3/uL (ref 4.0–10.5)
nRBC: 0 % (ref 0.0–0.2)

## 2018-07-18 LAB — BASIC METABOLIC PANEL
Anion gap: 11 (ref 5–15)
BUN: 23 mg/dL (ref 8–23)
CO2: 19 mmol/L — ABNORMAL LOW (ref 22–32)
Calcium: 9.4 mg/dL (ref 8.9–10.3)
Chloride: 105 mmol/L (ref 98–111)
Creatinine, Ser: 0.75 mg/dL (ref 0.44–1.00)
GFR calc Af Amer: >60 mL/min (ref >60)
GFR calc non Af Amer: >60 mL/min (ref >60)
Glucose, Bld: 90 mg/dL (ref 70–99)
Potassium: 5.1 mmol/L (ref 3.5–5.1)
Sodium: 135 mmol/L (ref 135–145)

## 2018-07-18 MED ORDER — SODIUM CHLORIDE 0.9% FLUSH
3.0000 mL | Freq: Once | INTRAVENOUS | Status: AC
  Administered 2018-07-18: 02:00:00 3 mL via INTRAVENOUS

## 2018-07-18 MED ORDER — NITROGLYCERIN 0.4 MG SL SUBL: 0.4000 mg | SUBLINGUAL_TABLET | SUBLINGUAL | Status: DC | PRN

## 2018-07-18 NOTE — ED Triage Notes (Signed)
Per pt she has been having chest pain for 2 days. Pt says it is pressure in the center of her chest. Does not radiate. Pt has no nausea, no vomiting. Pt has ostomy bag from previous cancer of colon. Pt says she does feel fatigue and not her normal self. Pt did say she has done more than normal the past few day. Hx MI, and has 2 stents placed. 180/100, 985 ra, was given 324 aspirin and 1 nitro

## 2018-07-18 NOTE — ED Notes (Signed)
Lab contacted for troponin results, Kim in lab advised they were running troponin now

## 2018-07-18 NOTE — ED Notes (Signed)
Patient transported to X-ray 

## 2018-07-18 NOTE — Telephone Encounter (Signed)
I spoke to the patient who was in the ED last night with chest heaviness and SOB.  They ruled out a heart attack, but wanted her to f/u with cardiologist.  I told her that we would call to schedule.

## 2018-07-18 NOTE — ED Notes (Signed)
Pt verbalized understanding of d/c instructions and has no further questions, VSS, NAD. Pt to follow up with cardiology. Taken home by [employer].

## 2018-07-18 NOTE — ED Notes (Signed)
ED Provider at bedside. 

## 2018-07-18 NOTE — Telephone Encounter (Signed)
° ° °  Patient requesting to speak with nurse about 6/4 ED visit, declined to schedule appointment at this time

## 2018-07-18 NOTE — ED Notes (Addendum)
Pt's neighbor, "Cookie" was contacted (with pts permission @ [phone]) to inform them that she is ready for discharge. This RN was informed that it will be about an hr before they arrive.

## 2018-07-18 NOTE — ED Provider Notes (Signed)
Emergency Department Provider Note   I have reviewed the triage vital signs and the nursing notes.   HISTORY  Chief Complaint Chest Pain   HPI [name] is a 85 y.o. female with history of coronary artery disease status post stent x2 in 2012 being followed by Dr. Angelena Form who presents to the emergency department today secondary to chest pain.  Patient states that she started having some left-sided chest pressure that does not feel anything like her previous heart attack.  Does not radiate she had no associated nausea or vomiting but did have some shortness of breath with it.  She states the shortness of breath is not necessarily new.  Is also had diarrhea recently from her ostomy but does not new either.  She is felt more fatigued recently.  She also states that she is worked more than she normally would over the last couple days.  Of note when she is exerting herself did not seem to make the chest pain worse.  No fever or cough.  No lower extremity swelling.  When I reference her blood pressure being high she states that she supposed to be taking a fluid pill that she has not been taking. On review of her records it appears that she is seeing Dr. Angelena Form recently.  She had some episodes of left arm discomfort he stated that she would not be a candidate for invasive cardiac testing secondary to her age.  Medical therapy was continued.   No other associated or modifying symptoms.    Past Medical History:  Diagnosis Date  . Arthritis    FINGERS   . CAD (coronary artery disease) CARDIOLOGIST-- DR Angelena Form   a. s/p INF STEMI 7/12: tx with BMS to RCA;  b. cath 08/26/10: pLAD 30%, mLAD 50%, D1 40%, pCFX 95%, mRCA occluded;   c. staged PCI of pCFX with BMS;   d. echo 7/12:   EF 60-65%, mild RAE, mild to moderate AI, mild MR, moderate TR, RVE, PASP 47  . Complication of anesthesia PT STATES "MADE HER FEEL CRAZY"  . Degeneration of eye    left eye cornea  . Dyspnea   . First degree  heart block   . Heart palpitations PAC'S AND SVT RUN'S  PER CARDIOLOGIST NOTE  . History of ST elevation myocardial infarction (STEMI) 08-26-2010-- INFERIOR WALL   S/P PCI  BMS IN RCA AND PROX. CX  . Hyperlipidemia   . Hypertension   . Impaired hearing BILATERAL HEARING AIDS  . Osteopenia   . PAF (paroxysmal atrial fibrillation) ([place])   . PONV (postoperative nausea and vomiting)   . Pulmonary nodules BENIGN  PER CT  10-12-2010  . Rectal Cancer 08/2010   adenocarcinoma   S/P PARTIAL PROCTECTOMY (NO CHEMO OR RADIATION)  . 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 S/P coronary artery stent placement 08/2010   X2  BM    Patient Active Problem List   Diagnosis Date Noted  . Slurred speech 02/23/2017  . PAF (paroxysmal atrial fibrillation) ([place]) 02/23/2017  . Depression with anxiety 02/23/2017  . TIA (transient ischemic attack)   . Fracture, tibia and fibula 07/06/2015  . Tibia/fibula fracture 07/03/2015  . Lung nodule seen on imaging study 09/05/2013  . Exposure to TB 09/05/2013  . SOB (shortness of breath) 10/02/2011  . Vascular skin changes 07/17/2011  . 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  Anorexia post-op 12/08/2010  . HTN (hypertension) 11/03/2010  . Dizziness 09/15/2010  . CAD (coronary artery disease)   . Hyperlipidemia   . Rectal cancer, pT2uN0(pNX) s/p TEM partial proctectomy 09/07/2010    Past Surgical History:  Procedure Laterality Date  . ABDOMINAL HYSTERECTOMY  1950's   AND APPENDECTOMY  . CATARACT EXTRACTION W/ INTRAOCULAR LENS  IMPLANT, BILATERAL    . CORONARY ANGIOPLASTY WITH STENT PLACEMENT  08-26-2010  DR [place]   PCI, BM STENT IN RCA  . CORONARY ANGIOPLASTY WITH STENT PLACEMENT  08-29-2010  DR [place]   PCI, BM STENT IN PROXIMAL CIRCUMFLEX  . EXCISION BENIGN CYST RIGHT BREAST    . FISTULA PLUG N/A 06/21/2012   Procedure: insertion of  FISTULA PLUG;  Surgeon: Leighton Ruff, MD;  Location: [facility];  Service: General;  Laterality: N/A;  . FLEXIBLE SIGMOIDOSCOPY N/A 04/02/2012   Procedure: FLEXIBLE SIGMOIDOSCOPY;  Surgeon: Leighton Ruff, MD;  Location: [facility];  Service: Endoscopy;  Laterality: N/A;  . KNEE SURGERY Right 1996  . LAPROSCOPY LYSIS ADHESIONS/ DRAINAGE OF PELVIC ABSCESS/ DIVERTING LOOP SIGMOID COLECTOMY  11-22-2010   POST OP RECTOVAGINAL FISTULA  . PARTIAL PROCTECTOMY BY TEM  11-17-2010   RECTAL CANCER  . RELEASE LEFT CARPAL TUNNEL/ OSTEOTOMY LEFT DISTAL RADIUS  11-10-2009  . STAPEDECTOMY  1970'S  . TONSILLECTOMY  CHILD  . TOTAL HIP ARTHROPLASTY Right 1992  . TRANSTHORACIC ECHOCARDIOGRAM  10-10-2011   NORMAL LV SIZE WITH MILD FOCAL BASAL SEPTAL HYPERTROPHY/ EF 55-60%/ NORMAL RV SIZE AND LVSF/ BIATRIAL ENLARGEMENT/ MILD TO MODERATE AI  &  TR  . TYMPANOPLASTY Left 12-23-2009    Current Outpatient Rx  . Order #: [id number] Class: Normal  . Order #: [id number] Class: Historical Med  . Order #: [id number] Class: Historical Med  . Order #: [id number] Class: Historical Med  . Order #: [id number] Class: Historical Med  . Order #: [id number] Class: Historical Med  . Order #: [id number] Class: Historical Med  . Order #: [id number] Class: Normal  . Order #: [id number] Class: Historical Med  . Order #: [id number] Class: Historical Med  . Order #: [id number] Class: Historical Med  . Order #: [id number] Class: No Print    Allergies Effexor [venlafaxine hydrochloride]; Penicillins; Sulfa antibiotics; Dicyclomine; and Hydrocodone  Family History  Problem Relation Age of Onset  . Kidney disease Mother   . Heart disease Father   . Cancer Brother     Social History Social History   Tobacco Use  . Smoking status: Never Smoker  . Smokeless tobacco: Never Used  Substance Use Topics  . Alcohol use: No  . Drug use: No    Review of Systems  All other systems negative except as documented in the HPI. All  pertinent positives and negatives as reviewed in the HPI. ____________________________________________   PHYSICAL EXAM:  VITAL SIGNS: ED Triage Vitals  Enc Vitals Group     BP 07/18/18 0145 (!) 161/58     Pulse Rate 07/18/18 0145 67     Resp 07/18/18 0145 (!) 23     Temp 07/18/18 0145 97.8 F (36.6 C)     Temp Source 07/18/18 0145 Oral     SpO2 07/18/18 0145 98 %     Weight --     Constitutional: Alert and oriented. Well appearing and in no acute distress. Eyes: Conjunctivae are normal. PERRL. EOMI. Head: Atraumatic. Nose: No congestion/rhinnorhea. Mouth/Throat: Mucous membranes are moist.  Oropharynx non-erythematous. Neck: No stridor.  No meningeal signs.   Cardiovascular: Normal rate, regular rhythm. Good peripheral circulation. Grossly normal heart  sounds.   Respiratory: Normal respiratory effort.  No retractions. Lungs CTAB. Gastrointestinal: Soft and nontender. No distention.  Musculoskeletal: No lower extremity tenderness nor edema. No gross deformities of extremities. Neurologic:  Normal speech and language. No gross focal neurologic deficits are appreciated.  Skin:  Skin is warm, dry and intact. No rash noted.   ____________________________________________   LABS (all labs ordered are listed, but only abnormal results are displayed)  Labs Reviewed  BASIC METABOLIC PANEL - Abnormal; Notable for the following components:      Result Value   CO2 19 (*)    All other components within normal limits  CBC - Abnormal; Notable for the following components:   RBC 3.81 (*)    All other components within normal limits  TROPONIN I  TROPONIN I   ____________________________________________  EKG   EKG Interpretation  Date/Time:  Thursday July 18 2018 01:55:05 EDT Ventricular Rate:  68 PR Interval:    QRS Duration: 135 QT Interval:  428 QTC Calculation: 456 R Axis:   -2 Text Interpretation:  Sinus rhythm IVCD, consider atypical RBBB Probable left ventricular  hypertrophy Anterior Q waves, possibly due to LVH No significant change since 6/20 Confirmed by Aletta Edouard [phone]) on 07/19/2018 12:39:27 PM      ____________________________________________  RADIOLOGY  No results found. ____________________________________________   PROCEDURES  Procedure(s) performed:   Procedures   ____________________________________________   INITIAL IMPRESSION / ASSESSMENT AND PLAN / ED COURSE  Atypical sounding chest pain.  Associated symptoms actually seem like it been going on for a while according to the records.  This really is just the chest discomfort that she states does not feel like her previous heart attack.  She has a relatively normal EKG albeit with significant artifact to it.  She also has new LVH was probably from her elevated blood pressure.  She has pretty significantly elevated T wave amplitude but only one lead, V2.  At this point we will try to make her symptoms better but does not seem like she is really a candidate for invasive therapy anyway unless she is having a significant cardiac event which I do not think she has.  We will do delta troponins and work on symptom control.  Second troponin negative. Doubt active ACS beyond her chronic worsening of symptoms that is already being adressed by cardiologist.   Pertinent labs & imaging results that were available during my care of the patient were reviewed by me and considered in my medical decision making (see chart for details).  A medical screening exam was performed and I feel the patient has had an appropriate workup for their chief complaint at this time and likelihood of emergent condition existing is low. They have been counseled on decision, discharge, follow up and which symptoms necessitate immediate return to the emergency department. They or their family verbally stated understanding and agreement with plan and discharged in stable condition.    ____________________________________________  FINAL CLINICAL IMPRESSION(S) / ED DIAGNOSES  Final diagnoses:  Nonspecific chest pain     MEDICATIONS GIVEN DURING THIS VISIT:  Medications  sodium chloride flush (NS) 0.9 % injection 3 mL (3 mLs Intravenous Given 07/18/18 0149)     NEW OUTPATIENT MEDICATIONS STARTED DURING THIS VISIT:  Discharge Medication List as of 07/18/2018  7:15 AM      Note:  This note was prepared with assistance of Dragon voice recognition software. Occasional wrong-word or sound-a-like substitutions may have occurred due to the inherent limitations of voice  recognition software.   Merrily Pew, MD 07/19/18 2325

## 2018-07-29 ENCOUNTER — Other Ambulatory Visit: Payer: Self-pay

## 2018-07-29 ENCOUNTER — Telehealth (INDEPENDENT_AMBULATORY_CARE_PROVIDER_SITE_OTHER): Payer: Medicare Other | Admitting: Cardiology

## 2018-07-29 ENCOUNTER — Encounter: Payer: Self-pay | Admitting: Cardiology

## 2018-07-29 VITALS — BP 132/53 | HR 69 | Ht 63.0 in | Wt 113.0 lb

## 2018-07-29 DIAGNOSIS — I480 Paroxysmal atrial fibrillation: Secondary | ICD-10-CM

## 2018-07-29 DIAGNOSIS — I2583 Coronary atherosclerosis due to lipid rich plaque: Secondary | ICD-10-CM | POA: Diagnosis not present

## 2018-07-29 DIAGNOSIS — I2510 Atherosclerotic heart disease of native coronary artery without angina pectoris: Secondary | ICD-10-CM | POA: Diagnosis not present

## 2018-07-29 DIAGNOSIS — I10 Essential (primary) hypertension: Secondary | ICD-10-CM

## 2018-07-29 DIAGNOSIS — I351 Nonrheumatic aortic (valve) insufficiency: Secondary | ICD-10-CM

## 2018-07-29 NOTE — Patient Instructions (Signed)

## 2018-07-29 NOTE — Progress Notes (Signed)
Virtual Visit via Telephone Note   This visit type was conducted due to national recommendations for restrictions regarding the COVID-19 Pandemic (e.g. social distancing) in an effort to limit this patient's exposure and mitigate transmission in our community.  Due to her co-morbid illnesses, this patient is at least at moderate risk for complications without adequate follow up.  This format is felt to be most appropriate for this patient at this time.  The patient did not have access to video technology/had technical difficulties with video requiring transitioning to audio format only (telephone).  All issues noted in this document were discussed and addressed.  No physical exam could be performed with this format.  Please refer to the patient's chart for her  consent to telehealth for [facility].   Date:  07/29/2018   ID:  [name], DOB 1916/07/07, MRN [id number]  Patient Location: Home Provider Location: Office  PCP:  Leonard Downing, MD  Cardiologist:  Lauree Chandler, MD  Electrophysiologist:  None   Evaluation Performed:  Follow-Up Visit  Chief Complaint:  Hospital follow up for chest pain  History of Present Illness:    [name] is a 85 y.o. female with  with history of CAD, aortic valve insufficiency, mitral regurgitation, rectal cancer and atrial flutter who is here today for cardiac follow up. She was admitted to [facility] July 2012 with an inferior STEMI and was found to have a total occlusion of the proximal RCA which was treated with a bare metal stent. There was moderate residual disease in the mid RCA and severe disease in the proximal Circumflex artery. A bare metal stent was placed in the circumflex artery in a staged procedure. Echo March 2016 with normal LV function, moderate AI, mild MR. She is known to have SVT, PVCs and PACs documented by cardiac monitor in 2013. She was found to have atrial flutter in September 2017. Anti-coagulation was not  started given advanced age and fall risk. She has been on ASA and a beta blocker. She was admitted with a probable TIA in January 2018 and has recovered neurologically. She has had ongoing dyspnea for the last few years.  Echo January 2019 with normal LV systolic function, TOIZ=12-45%. There was grade 3 diastolic dysfunction and mild to moderate aortic valve insufficiency with trivial MR.She was seen in the ED on 05/12/17 with chest pain. Troponin was negative. She had no ischemic EKG changes. She was not admitted. I saw her in October 2019 and she reported ongoing fatigue and dyspnea but no chest pain.   She went to the ED on 07/18/2018 for evaluation of chest pain. She is seen virtually today by phone for hospital follow up. She had been awakened during the night with chest heaviness and shortness of breath. This did not feel like her prior heart attack. She has not had any chest pressure since then. Her daughter told her it sounded like a panic attack. She continues to have some shortness of breath with activities like getting dressed in the morning, sometimes working around the house or when she gets up to answer the phone. She was given lasix to use as needed by her PCP. She has taken it about every other day. She feels like this may be helping some but not to any great degree.   She has some mild pedal edema more at night and improves by morning. No orthopnea or PND.   She has been having problems with bad dreams and has intermittently  attributed them to metoprolol although she seemed not to have any improvement when she stopped the metoprolol. She is now trying verapamil instead and stopped metoprolol.   The patient does not have symptoms concerning for COVID-19 infection (fever, chills, cough, or new shortness of breath).    Past Medical History:  Diagnosis Date  . Arthritis    FINGERS   . CAD (coronary artery disease) CARDIOLOGIST-- DR Angelena Form   a. s/p INF STEMI 7/12: tx with BMS to RCA;  b.  cath 08/26/10: pLAD 30%, mLAD 50%, D1 40%, pCFX 95%, mRCA occluded;   c. staged PCI of pCFX with BMS;   d. echo 7/12:   EF 60-65%, mild RAE, mild to moderate AI, mild MR, moderate TR, RVE, PASP 47  . Complication of anesthesia PT STATES "MADE HER FEEL CRAZY"  . Degeneration of eye    left eye cornea  . Dyspnea   . First degree heart block   . Heart palpitations PAC'S AND SVT RUN'S  PER CARDIOLOGIST NOTE  . History of ST elevation myocardial infarction (STEMI) 08-26-2010-- INFERIOR WALL   S/P PCI  BMS IN RCA AND PROX. CX  . Hyperlipidemia   . Hypertension   . Impaired hearing BILATERAL HEARING AIDS  . Osteopenia   . PAF (paroxysmal atrial fibrillation) ([place])   . PONV (postoperative nausea and vomiting)   . Pulmonary nodules BENIGN  PER CT  10-12-2010  . Rectal Cancer 08/2010   adenocarcinoma   S/P PARTIAL PROCTECTOMY (NO CHEMO OR RADIATION)  . 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 S/P coronary artery stent placement 08/2010   X2  BM   Past Surgical History:  Procedure Laterality Date  . ABDOMINAL HYSTERECTOMY  1950's   AND APPENDECTOMY  . CATARACT EXTRACTION W/ INTRAOCULAR LENS  IMPLANT, BILATERAL    . CORONARY ANGIOPLASTY WITH STENT PLACEMENT  08-26-2010  DR [place]   PCI, BM STENT IN RCA  . CORONARY ANGIOPLASTY WITH STENT PLACEMENT  08-29-2010  DR [place]   PCI, BM STENT IN PROXIMAL CIRCUMFLEX  . EXCISION BENIGN CYST RIGHT BREAST    . FISTULA PLUG N/A 06/21/2012   Procedure: insertion of FISTULA PLUG;  Surgeon: Leighton Ruff, MD;  Location: [facility];  Service: General;  Laterality: N/A;  . FLEXIBLE SIGMOIDOSCOPY N/A 04/02/2012   Procedure: FLEXIBLE SIGMOIDOSCOPY;  Surgeon: Leighton Ruff, MD;  Location: [facility];  Service: Endoscopy;  Laterality: N/A;  . KNEE SURGERY Right 1996  . LAPROSCOPY LYSIS ADHESIONS/ DRAINAGE OF PELVIC ABSCESS/ DIVERTING LOOP SIGMOID COLECTOMY   11-22-2010   POST OP RECTOVAGINAL FISTULA  . PARTIAL PROCTECTOMY BY TEM  11-17-2010   RECTAL CANCER  . RELEASE LEFT CARPAL TUNNEL/ OSTEOTOMY LEFT DISTAL RADIUS  11-10-2009  . STAPEDECTOMY  1970'S  . TONSILLECTOMY  CHILD  . TOTAL HIP ARTHROPLASTY Right 1992  . TRANSTHORACIC ECHOCARDIOGRAM  10-10-2011   NORMAL LV SIZE WITH MILD FOCAL BASAL SEPTAL HYPERTROPHY/ EF 55-60%/ NORMAL RV SIZE AND LVSF/ BIATRIAL ENLARGEMENT/ MILD TO MODERATE AI  &  TR  . TYMPANOPLASTY Left 12-23-2009     Current Meds  Medication Sig  . acetaminophen (TYLENOL) 500 MG tablet Take 2 tablets (1,000 mg total) by mouth every 6 (six) hours as needed for mild pain.  Marland Kitchen aspirin EC 81 MG tablet Take 81 mg by mouth daily.  . beta carotene w/minerals (OCUVITE) tablet Take 1 tablet by mouth daily.  Marland Kitchen BIOTIN PO Take  1 tablet by mouth daily.   . cholecalciferol (VITAMIN D) 1000 units tablet Take 1,000 Units by mouth daily.  . feeding supplement, ENSURE ENLIVE, (ENSURE ENLIVE) LIQD Take 237 mLs by mouth daily at 3 pm.  . furosemide (LASIX) 40 MG tablet Take 20 mg by mouth every other day.  . nitroGLYCERIN (NITROSTAT) 0.4 MG SL tablet DISSOLVE ONE TABLET UNDER THE TONGUE EVERY 5 MINUTES AS NEEDED FOR CHEST PAIN.  DO NOT EXCEED A TOTAL OF 3 DOSES IN 15 MINUTES (Patient taking differently: Place 0.4 mg under the tongue every 5 (five) minutes as needed for chest pain. )  . nystatin-triamcinolone (MYCOLOG II) cream Apply 2 application topically daily as needed (skin care).   Vladimir Faster Glycol-Propyl Glycol (SYSTANE) 0.4-0.3 % GEL Place 1 drop into both eyes at bedtime.   Marland Kitchen PREMARIN vaginal cream Place 2 application vaginally 2 (two) times a week.  . verapamil (CALAN) 80 MG tablet Take 1 tablet by mouth 3 (three) times daily.     Allergies:   Effexor [venlafaxine hydrochloride], Penicillins, Sulfa antibiotics, Dicyclomine, and Hydrocodone   Social History   Tobacco Use  . Smoking status: Never Smoker  . Smokeless tobacco: Never  Used  Substance Use Topics  . Alcohol use: No  . Drug use: No     Family Hx: The patient's family history includes Cancer in her brother; Heart disease in her father; Kidney disease in her mother.  ROS:   Please see the history of present illness.     All other systems reviewed and are negative.   Prior CV studies:   The following studies were reviewed today:  Echo 02/23/17: - Left ventricle: The cavity size was normal. There was moderate focal basal and mild concentric hypertrophy. Systolic function was normal. The estimated ejection fraction was in the range of 60% to 65%. Wall motion was normal; there were no regional wall motion abnormalities. There was a reduced contribution of atrial contraction to ventricular filling, due to increased ventricular diastolic pressure or atrial contractile dysfunction. Doppler parameters are consistent with a reversible restrictive pattern, indicative of decreased left ventricular diastolic compliance and/or increased left atrial pressure (grade 3 diastolic dysfunction). Doppler parameters are consistent with high ventricular filling pressure. - Aortic valve: There was mild to moderate regurgitation. - Mitral valve: Severely calcified annulus. - Left atrium: The atrium was mildly dilated. - Pulmonic valve: There was trivial regurgitation. - Pulmonary arteries: PA peak pressure: 32 mm Hg (S).  Labs/Other Tests and Data Reviewed:    EKG:  An ECG dated 07/18/2018 was personally reviewed today and demonstrated:  Sinus rhythm  Recent Labs: 07/18/2018: BUN 23; Creatinine, Ser 0.75; Hemoglobin 12.0; Platelets 173; Potassium 5.1; Sodium 135   Recent Lipid Panel Lab Results  Component Value Date/Time   CHOL 191 02/23/2017 05:03 AM   TRIG 77 02/23/2017 05:03 AM   HDL 67 02/23/2017 05:03 AM   CHOLHDL 2.9 02/23/2017 05:03 AM   LDLCALC 109 (H) 02/23/2017 05:03 AM    Wt Readings from Last 3 Encounters:  07/29/18 113 lb  (51.3 kg)  03/25/18 111 lb 12.8 oz (50.7 kg)  11/19/17 110 lb (49.9 kg)     Objective:    Vital Signs:  BP (!) 132/53   Pulse 69   Ht 5\' 3"  (1.6 m)   Wt 113 lb (51.3 kg)   BMI 20.02 kg/m    VITAL SIGNS:  reviewed GEN:  no acute distress RESPIRATORY:  No audible wheezes or cough. She is able  to speak in complete sentences.  NEURO:  alert and oriented x 3, no obvious focal deficit PSYCH:  normal affect  ASSESSMENT & PLAN:    1. CAD  -Pt with recent episode of chest heaviness with shortness of breath. Ruled out for MI in the ED. No further episodes. Pt has had mild DOE for quite some time and she feels like she is getting more weak over time as she ages. She may be having some effects of CAD but Due to her advanced age she is not felt to be a candidate a cardiac cath.  -She has been having bad dreams and feels like this is related to the metoprolol. I am not sure of that. She was tried on diltiazem which was not better and is now trying verapamil.   2. Atrial flutter, paroxysmal  -Pt was recently in sinus rhythm at ED on 07/18/18.  -Dr. Angelena Form and the pt have elected not to start anti-coagulation given her advanced age.  -She is now trying verapamil instead of metoprolol due to causing bad dreams. She is hopeful that this will work as she does not want to have aflutter again.  3. HTN -BP controlled.    4. Aortic insufficiency: Mild to moderate AI by echo January 2019. No change since 2016.  Not felt to be contributing to her dyspnea per Dr. Angelena Form    COVID-19 Education: The signs and symptoms of COVID-19 were discussed with the patient and how to seek care for testing (follow up with PCP or arrange E-visit).  The importance of social distancing was discussed today.  Time:   Today, I have spent 19 minutes with the patient with telehealth technology discussing the above problems.     Medication Adjustments/Labs and Tests Ordered: Current medicines are reviewed at length  with the patient today.  Concerns regarding medicines are outlined above.   Tests Ordered: No orders of the defined types were placed in this encounter.   Medication Changes: No orders of the defined types were placed in this encounter.   Follow Up:  Virtual Visit or In Person in 6 month(s)  Signed, Daune Perch, NP  07/29/2018 3:20 PM    [facility]

## 2018-09-12 ENCOUNTER — Telehealth: Payer: Self-pay | Admitting: *Deleted

## 2018-09-12 MED ORDER — FUROSEMIDE 40 MG PO TABS: 40.0000 mg | ORAL_TABLET | Freq: Every day | ORAL | 2 refills | Status: DC

## 2018-09-12 NOTE — Telephone Encounter (Signed)
I spoke with pt. She has been taking half of a 40 mg tablet daily.  She will increase to whole tablet daily. Will send new prescription to [facility] on [address].

## 2018-09-12 NOTE — Telephone Encounter (Signed)
-----   Message from Burnell Blanks, MD sent at 09/12/2018 10:40 AM EDT ----- Fraser Din, Can you touch base with her? BNP slightly elevated in primary care. She has been taking lasix every other day I think. She should go to taking Lasix every day. If her dose has been 20 mg every other day then would increase to 20 mg daily. If she has been taking Lasix 20 mg daily, would increase to 40 mg daily. Thanks, chris

## 2018-12-16 ENCOUNTER — Ambulatory Visit (INDEPENDENT_AMBULATORY_CARE_PROVIDER_SITE_OTHER): Payer: Medicare Other | Admitting: Cardiovascular Disease

## 2018-12-16 ENCOUNTER — Encounter (INDEPENDENT_AMBULATORY_CARE_PROVIDER_SITE_OTHER): Payer: Self-pay

## 2018-12-16 ENCOUNTER — Other Ambulatory Visit: Payer: Self-pay

## 2018-12-16 ENCOUNTER — Encounter: Payer: Self-pay | Admitting: Cardiovascular Disease

## 2018-12-16 VITALS — BP 120/62 | HR 67 | Ht 63.0 in | Wt 110.0 lb

## 2018-12-16 DIAGNOSIS — I10 Essential (primary) hypertension: Secondary | ICD-10-CM

## 2018-12-16 DIAGNOSIS — I351 Nonrheumatic aortic (valve) insufficiency: Secondary | ICD-10-CM

## 2018-12-16 DIAGNOSIS — I2510 Atherosclerotic heart disease of native coronary artery without angina pectoris: Secondary | ICD-10-CM

## 2018-12-16 DIAGNOSIS — I480 Paroxysmal atrial fibrillation: Secondary | ICD-10-CM

## 2018-12-16 DIAGNOSIS — I5032 Chronic diastolic (congestive) heart failure: Secondary | ICD-10-CM

## 2018-12-16 DIAGNOSIS — I2583 Coronary atherosclerosis due to lipid rich plaque: Secondary | ICD-10-CM

## 2018-12-16 NOTE — Progress Notes (Signed)
Chief Complaint  Patient presents with  . Follow-up    CAD   History of Present Illness: 85 yo female with history of CAD, aortic valve insufficiency, mitral regurgitation, rectal cancer and atrial flutter who is here today for cardiac follow up. She was admitted to [facility] July 2012 with an inferior STEMI and was found to have a total occlusion of the proximal RCA which was treated with a bare metal stent. There was moderate residual disease in the mid RCA and severe disease in the proximal Circumflex artery. A bare metal stent was placed in the circumflex artery in a staged procedure. Echo March 2016 with normal LV function, moderate AI, mild MR. She is known to have SVT, PVCs and PACs documented by cardiac monitor in 2013. She was found to have atrial flutter in September 2017. Anti-coagulation was not started given advanced age and fall risk. She has been on ASA and a beta blocker. She was admitted with a probable TIA in January 2018 and has recovered neurologically. Echo January 2019 with normal LV systolic function, [id number]. There was grade 3 diastolic dysfunction and mild to moderate aortic valve insufficiency with trivial MR.She was seen in the ED on 05/12/17 with chest pain. Troponin was negative. She had no ischemic EKG changes. She was not admitted. She has had severe fatigue and dyspnea for the past few years.  Some improvement on Lasix.   She is here today for follow up. The patient denies any chest pain, dyspnea, palpitations, lower extremity edema, orthopnea, PND, dizziness, near syncope or syncope.    Primary Care Physician: Leonard Downing, MD  Past Medical History:  Diagnosis Date  . Arthritis    FINGERS   . CAD (coronary artery disease) CARDIOLOGIST-- DR Angelena Form   a. s/p INF STEMI 7/12: tx with BMS to RCA;  b. cath 08/26/10: pLAD 30%, mLAD 50%, D1 40%, pCFX 95%, mRCA occluded;   c. staged PCI of pCFX with BMS;   d. echo 7/12:   EF 60-65%, mild RAE, mild to moderate AI,  mild MR, moderate TR, RVE, PASP 47  . Complication of anesthesia PT STATES "MADE HER FEEL CRAZY"  . Degeneration of eye    left eye cornea  . Dyspnea   . First degree heart block   . Heart palpitations PAC'S AND SVT RUN'S  PER CARDIOLOGIST NOTE  . History of ST elevation myocardial infarction (STEMI) 08-26-2010-- INFERIOR WALL   S/P PCI  BMS IN RCA AND PROX. CX  . Hyperlipidemia   . Hypertension   . Impaired hearing BILATERAL HEARING AIDS  . Osteopenia   . PAF (paroxysmal atrial fibrillation) ([place])   . PONV (postoperative nausea and vomiting)   . Pulmonary nodules BENIGN  PER CT  10-12-2010  . Rectal Cancer 08/2010   adenocarcinoma   S/P PARTIAL PROCTECTOMY (NO CHEMO OR RADIATION)  . 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 S/P coronary artery stent placement 08/2010   X2  BM    Past Surgical History:  Procedure Laterality Date  . ABDOMINAL HYSTERECTOMY  1950's   AND APPENDECTOMY  . CATARACT EXTRACTION W/ INTRAOCULAR LENS  IMPLANT, BILATERAL    . CORONARY ANGIOPLASTY WITH STENT PLACEMENT  08-26-2010  DR [place]   PCI, BM STENT IN RCA  . CORONARY ANGIOPLASTY WITH STENT PLACEMENT  08-29-2010  DR [place]   PCI, BM STENT IN PROXIMAL CIRCUMFLEX  . EXCISION BENIGN CYST RIGHT  BREAST    . FISTULA PLUG N/A 06/21/2012   Procedure: insertion of FISTULA PLUG;  Surgeon: Leighton Ruff, MD;  [facility];  Service: General;  Laterality: N/A;  . FLEXIBLE SIGMOIDOSCOPY N/A 04/02/2012   Procedure: FLEXIBLE SIGMOIDOSCOPY;  Surgeon: Leighton Ruff, MD;  Location: [facility];  Service: Endoscopy;  Laterality: N/A;  . KNEE SURGERY Right 1996  . LAPROSCOPY LYSIS ADHESIONS/ DRAINAGE OF PELVIC ABSCESS/ DIVERTING LOOP SIGMOID COLECTOMY  11-22-2010   POST OP RECTOVAGINAL FISTULA  . PARTIAL PROCTECTOMY BY TEM  11-17-2010   RECTAL CANCER  . RELEASE LEFT CARPAL TUNNEL/ OSTEOTOMY LEFT DISTAL RADIUS   11-10-2009  . STAPEDECTOMY  1970'S  . TONSILLECTOMY  CHILD  . TOTAL HIP ARTHROPLASTY Right 1992  . TRANSTHORACIC ECHOCARDIOGRAM  10-10-2011   NORMAL LV SIZE WITH MILD FOCAL BASAL SEPTAL HYPERTROPHY/ EF 55-60%/ NORMAL RV SIZE AND LVSF/ BIATRIAL ENLARGEMENT/ MILD TO MODERATE AI  &  TR  . TYMPANOPLASTY Left 12-23-2009    Current Outpatient Medications  Medication Sig Dispense Refill  . acetaminophen (TYLENOL) 500 MG tablet Take 2 tablets (1,000 mg total) by mouth every 6 (six) hours as needed for mild pain. 30 tablet 0  . aspirin EC 81 MG tablet Take 81 mg by mouth daily.    . beta carotene w/minerals (OCUVITE) tablet Take 1 tablet by mouth daily.    Marland Kitchen BIOTIN PO Take 1 tablet by mouth daily.     . cholecalciferol (VITAMIN D) 1000 units tablet Take 1,000 Units by mouth daily.    . feeding supplement, ENSURE ENLIVE, (ENSURE ENLIVE) LIQD Take 237 mLs by mouth daily at 3 pm. 237 mL 12  . furosemide (LASIX) 40 MG tablet Take 20 mg by mouth daily.    . nitroGLYCERIN (NITROSTAT) 0.4 MG SL tablet Place 0.4 mg under the tongue every 5 (five) minutes as needed for chest pain.    Marland Kitchen nystatin-triamcinolone (MYCOLOG II) cream Apply 2 application topically daily as needed (skin care).   0  . Polyethyl Glycol-Propyl Glycol (SYSTANE) 0.4-0.3 % GEL Place 1 drop into both eyes at bedtime.     Marland Kitchen PREMARIN vaginal cream Place 2 application vaginally as needed.   1   No current facility-administered medications for this visit.     Allergies  Allergen Reactions  . Effexor [Venlafaxine Hydrochloride] Other (See Comments)    Reaction not recalled by the patient  . Penicillins Shortness Of Breath    Has patient had a PCN reaction causing immediate rash, facial/tongue/throat swelling, SOB or lightheadedness with hypotension: Yes Has patient had a PCN reaction causing severe rash involving mucus membranes or skin necrosis: No Has patient had a PCN reaction that required hospitalization: No Has patient had a PCN  reaction occurring within the last 10 years: No If all of the above answers are "NO", then may proceed with Cephalosporin use.   . Sulfa Antibiotics Nausea Only  . Dicyclomine Other (See Comments)    Caused confusion  . Hydrocodone Nausea Only    Social History   Socioeconomic History  . Marital status: Widowed    Spouse name: Not on file  . Number of children: Not on file  . Years of education: Not on file  . Highest education level: Not on file  Occupational History  . Not on file  Social Needs  . Financial resource strain: Not on file  . Food insecurity    Worry: Not on file    Inability: Not on file  . Transportation  needs    Medical: Not on file    Non-medical: Not on file  Tobacco Use  . Smoking status: Never Smoker  . Smokeless tobacco: Never Used  Substance and Sexual Activity  . Alcohol use: No  . Drug use: No  . Sexual activity: Not on file  Lifestyle  . Physical activity    Days per week: Not on file    Minutes per session: Not on file  . Stress: Not on file  Relationships  . Social Herbalist on phone: Not on file    Gets together: Not on file    Attends religious service: Not on file    Active member of club or organization: Not on file    Attends meetings of clubs or organizations: Not on file    Relationship status: Not on file  . Intimate partner violence    Fear of current or ex partner: Not on file    Emotionally abused: Not on file    Physically abused: Not on file    Forced sexual activity: Not on file  Other Topics Concern  . Not on file  Social History Narrative  . Not on file    Family History  Problem Relation Age of Onset  . Kidney disease Mother   . Heart disease Father   . Cancer Brother     Review of Systems:  As stated in the HPI and otherwise negative.   BP 120/62   Pulse 67   Ht 5\' 3"  (1.6 m)   Wt 110 lb (49.9 kg)   SpO2 98%   BMI 19.49 kg/m   Physical Examination:  General: Well developed, well  nourished, NAD  HEENT: OP clear, mucus membranes moist  SKIN: warm, dry. No rashes. Neuro: No focal deficits  Musculoskeletal: Muscle strength 5/5 all ext  Psychiatric: Mood and affect normal  Neck: No JVD, no carotid bruits, no thyromegaly, no lymphadenopathy.  Lungs:Clear bilaterally, no wheezes, rhonci, crackles Cardiovascular: Regular rate and rhythm. No murmurs, gallops or rubs. Abdomen:Soft. Bowel sounds present. Non-tender.  Extremities: No lower extremity edema. Pulses are 2 + in the bilateral DP/PT.  Echo 02/23/17: - Left ventricle: The cavity size was normal. There was moderate   focal basal and mild concentric hypertrophy. Systolic function   was normal. The estimated ejection fraction was in the range of   60% to 65%. Wall motion was normal; there were no regional wall   motion abnormalities. There was a reduced contribution of atrial   contraction to ventricular filling, due to increased ventricular   diastolic pressure or atrial contractile dysfunction. Doppler   parameters are consistent with a reversible restrictive pattern,   indicative of decreased left ventricular diastolic compliance   and/or increased left atrial pressure (grade 3 diastolic   dysfunction). Doppler parameters are consistent with high   ventricular filling pressure. - Aortic valve: There was mild to moderate regurgitation. - Mitral valve: Severely calcified annulus. - Left atrium: The atrium was mildly dilated. - Pulmonic valve: There was trivial regurgitation. - Pulmonary arteries: PA peak pressure: 32 mm Hg (S).  EKG:  EKG is not ordered today. The ekg ordered today demonstrates  Recent Labs: 07/18/2018: BUN 23; Creatinine, Ser 0.75; Hemoglobin 12.0; Platelets 173; Potassium 5.1; Sodium 135   Lipid Panel    Component Value Date/Time   CHOL 191 02/23/2017 0503   TRIG 77 02/23/2017 0503   HDL 67 02/23/2017 0503   CHOLHDL 2.9 02/23/2017 0503   VLDL  15 02/23/2017 0503   LDLCALC 109 (H)  02/23/2017 0503     Wt Readings from Last 3 Encounters:  12/16/18 110 lb (49.9 kg)  07/29/18 113 lb (51.3 kg)  03/25/18 111 lb 12.8 oz (50.7 kg)     Other studies Reviewed: Additional studies/ records that were reviewed today include: . Review of the above records demonstrates:   Assessment and Plan:   1. CAD without angina: She has baseline dyspnea and fatigue but she is not felt to be a good candidate for cardiac cath given advanced age. No chest pain or change in symptoms over last two years. Her beta blocker was stopped due to fatigue and nightmares. Continue ASA.    2. Atrial flutter, paroxysmal: sinus today. We have elected not to start anti-coagulation given her advanced age. Continue Lopressor.  3. HTN: BP is controlled.   4. Aortic insufficiency: Mild to moderate AI by echo January 2019. No change since 2016.  Will not plan to repeat echo.   5. Dyspnea: This has been persistent for years. Lungs are clear. No LE edema. Her aortic valve insufficiency is mild to moderate and likely not contributory.   6 Chronic diastolic CHF:  Weight is stable on Lasix 20 mg daily. Will continue Lasix.   Current medicines are reviewed at length with the patient today.  The patient does not have concerns regarding medicines.  The following changes have been made:  no change  Labs/ tests ordered today include:  No orders of the defined types were placed in this encounter.   Disposition:   FU with me in 6 months  Signed, Lauree Chandler, MD 12/16/2018 4:33 PM    [facility] [place], [place], [place]  [address] Phone: [phone]; Fax: [phone]

## 2018-12-16 NOTE — Patient Instructions (Signed)
Medication Instructions:  No changes *If you need a refill on your cardiac medications before your next appointment, please call your pharmacy*  Lab Work: none If you have labs (blood work) drawn today and your tests are completely normal, you will receive your results only by: . MyChart Message (if you have MyChart) OR . A paper copy in the mail If you have any lab test that is abnormal or we need to change your treatment, we will call you to review the results.  Testing/Procedures: none  Follow-Up: At [facility], you and your health needs are our priority.  As part of our continuing mission to provide you with exceptional heart care, we have created designated Provider Care Teams.  These Care Teams include your primary Cardiologist (physician) and Advanced Practice Providers (APPs -  Physician Assistants and Nurse Practitioners) who all work together to provide you with the care you need, when you need it.  Your next appointment:   6 month(s)  The format for your next appointment:   In Person  Provider:   Christopher McAlhany, MD  Other Instructions   

## 2019-02-13 ENCOUNTER — Ambulatory Visit: Payer: Medicare Other | Admitting: Cardiovascular Disease

## 2019-03-28 ENCOUNTER — Telehealth: Payer: Self-pay | Admitting: Cardiovascular Disease

## 2019-03-28 MED ORDER — METOPROLOL TARTRATE 25 MG PO TABS: 12.5000 mg | ORAL_TABLET | Freq: Two times a day (BID) | ORAL | 3 refills | Status: DC

## 2019-03-28 NOTE — Telephone Encounter (Signed)
I spoke with patient and gave her information from Dr Angelena Form.  Will send prescription to [facility] on [facility]

## 2019-03-28 NOTE — Telephone Encounter (Signed)
Left message to call office

## 2019-03-28 NOTE — Telephone Encounter (Signed)
Upon rereading note I intended to say "nightmares had gotten better and she resumed metoprolol"

## 2019-03-28 NOTE — Telephone Encounter (Signed)
I spoke with patient. She woke up this morning with very irregular heart beat. She was not having any chest pain but did have pain in arms and she felt weak. Took one NTG and four baby ASA. After this she started feeling better.   Metoprolol tartrate had been stopped due to nightmares and fatigue.  Patient stated nightmares had gotten bad and she resumed metoprolol tartrate 12.5 mg twice daily about 2-3 weeks ago because she was having some irregular heart beats once in awhile.  This morning she took metoprolol tartrate 25 mg. BP was 164/80 and heart rate 70.  Usually her BP is 122-132/60.  She has had diarrhea for the last 3 weeks and this is starting to improve but has made her feel weak. I advised patient to try to stay hydrated. I asked her to let us know is she has recurrence of symptoms or to go to ED if she has severe symptoms. Will forward to Dr Angelena Form to see what dose metoprolol tartrate he recommends patient take.

## 2019-03-28 NOTE — Telephone Encounter (Signed)
[name], I would recommend she resume the prior dose of metoprolol at 12.5 mg BID.

## 2019-03-28 NOTE — Telephone Encounter (Signed)
New Message  Patient c/o Palpitations:  High priority if patient c/o lightheadedness, shortness of breath, or chest pain  1) How long have you had palpitations/irregular HR/ Afib? Are you having the symptoms now? No  2) Are you currently experiencing lightheadedness, SOB or CP? SOB  3) Do you have a history of afib (atrial fibrillation) or irregular heart rhythm? Yes  4) Have you checked your BP or HR? (document readings if available): 164/80; 70  5) Are you experiencing any other symptoms? Both arms are extremely weak, back of neck is heavy

## 2019-04-04 ENCOUNTER — Telehealth: Payer: Self-pay | Admitting: Cardiovascular Disease

## 2019-04-04 MED ORDER — METOPROLOL SUCCINATE ER 25 MG PO TB24: 25.0000 mg | ORAL_TABLET | Freq: Every day | ORAL | 6 refills | Status: DC

## 2019-04-04 NOTE — Telephone Encounter (Signed)
Informed patient of recommendations.  She is in agreement and she verbalizes understanding of the long acting medication and to be sure not to get it mixed up with short acting metoprolol.  Will continue lasix.

## 2019-04-04 NOTE — Telephone Encounter (Signed)
Pt recently started back on metoprolol tartrate 12.5 mg twice a day because she was noticing more frequent irregular heart beats.  Now she is having nightmares on the twice a day dose so she has cut herself back to 12.5 mg DAILY. So far no increase in irregular heartbeats no nightmares. Wanted to know if ok to stay on this dose.  Does not think she has ever tried a different beta blocker or Toprol XL.  Her other questions is in regard to lasix 20 mg daily.  Dr. Arelia Sneddon prescribed.  It causes very frequent urination. Denies swelling.  Gets SOB at times and supposes this is the reason its prescribed.  I told her she should continue this and that Dr. Angelena Form will probably not recommend changing it but if he does we will let her know.

## 2019-04-04 NOTE — Telephone Encounter (Signed)
We could try Toprol 25 mg daily. Continue Lasix. Not surprised that it is making her urinate more frequently.

## 2019-04-04 NOTE — Telephone Encounter (Signed)
  Pt c/o medication issue:  1. Name of Medication: METOPROL  25mg   2. How are you currently taking this medication (dosage and times per day)? Twice a day  3. Are you having a reaction (difficulty breathing--STAT)? no  4. What is your medication issue?   [facility] wants to cut back on her dosage pt requesting a call back please

## 2019-04-20 ENCOUNTER — Emergency Department (HOSPITAL_COMMUNITY)
Admission: EM | Admit: 2019-04-20 | Discharge: 2019-04-20 | Disposition: A | Discharge status: Home / Self Care | Payer: Medicare PPO | Attending: Emergency Medicine | Admitting: Emergency Medicine

## 2019-04-20 ENCOUNTER — Encounter (HOSPITAL_COMMUNITY): Payer: Self-pay

## 2019-04-20 ENCOUNTER — Other Ambulatory Visit: Payer: Self-pay

## 2019-04-20 ENCOUNTER — Emergency Department (HOSPITAL_COMMUNITY): Payer: Medicare PPO

## 2019-04-20 DIAGNOSIS — I10 Essential (primary) hypertension: Secondary | ICD-10-CM | POA: Insufficient documentation

## 2019-04-20 DIAGNOSIS — Z85048 Personal history of other malignant neoplasm of rectum, rectosigmoid junction, and anus: Secondary | ICD-10-CM | POA: Diagnosis not present

## 2019-04-20 DIAGNOSIS — Z79899 Other long term (current) drug therapy: Secondary | ICD-10-CM | POA: Insufficient documentation

## 2019-04-20 DIAGNOSIS — N390 Urinary tract infection, site not specified: Secondary | ICD-10-CM

## 2019-04-20 DIAGNOSIS — I480 Paroxysmal atrial fibrillation: Secondary | ICD-10-CM | POA: Insufficient documentation

## 2019-04-20 DIAGNOSIS — Z7982 Long term (current) use of aspirin: Secondary | ICD-10-CM | POA: Diagnosis not present

## 2019-04-20 DIAGNOSIS — I2510 Atherosclerotic heart disease of native coronary artery without angina pectoris: Secondary | ICD-10-CM | POA: Insufficient documentation

## 2019-04-20 DIAGNOSIS — R002 Palpitations: Secondary | ICD-10-CM | POA: Diagnosis present

## 2019-04-20 DIAGNOSIS — R531 Weakness: Secondary | ICD-10-CM | POA: Diagnosis not present

## 2019-04-20 DIAGNOSIS — E876 Hypokalemia: Secondary | ICD-10-CM | POA: Diagnosis not present

## 2019-04-20 LAB — CBC WITH DIFFERENTIAL/PLATELET
Abs Immature Granulocytes: 0.03 10*3/uL (ref 0.00–0.07)
Basophils Absolute: 0 10*3/uL (ref 0.0–0.1)
Basophils Relative: 0 %
Eosinophils Absolute: 0.1 10*3/uL (ref 0.0–0.5)
Eosinophils Relative: 1 %
HCT: 35.5 % — ABNORMAL LOW (ref 36.0–46.0)
Hemoglobin: 11.5 g/dL — ABNORMAL LOW (ref 12.0–15.0)
Immature Granulocytes: 0 %
Lymphocytes Relative: 26 %
Lymphs Abs: 1.9 10*3/uL (ref 0.7–4.0)
MCH: 30.8 pg (ref 26.0–34.0)
MCHC: 32.4 g/dL (ref 30.0–36.0)
MCV: 95.2 fL (ref 80.0–100.0)
Monocytes Absolute: 0.7 10*3/uL (ref 0.1–1.0)
Monocytes Relative: 9 %
Neutro Abs: 4.5 10*3/uL (ref 1.7–7.7)
Neutrophils Relative %: 64 %
Platelets: 193 10*3/uL (ref 150–400)
RBC: 3.73 MIL/uL — ABNORMAL LOW (ref 3.87–5.11)
RDW: 13.1 % (ref 11.5–15.5)
WBC: 7.2 10*3/uL (ref 4.0–10.5)
nRBC: 0 % (ref 0.0–0.2)

## 2019-04-20 LAB — COMPREHENSIVE METABOLIC PANEL
ALT: 50 U/L — ABNORMAL HIGH (ref 0–44)
AST: 71 U/L — ABNORMAL HIGH (ref 15–41)
Albumin: 3 g/dL — ABNORMAL LOW (ref 3.5–5.0)
Alkaline Phosphatase: 70 U/L (ref 38–126)
Anion gap: 9 (ref 5–15)
BUN: 20 mg/dL (ref 8–23)
CO2: 25 mmol/L (ref 22–32)
Calcium: 9.2 mg/dL (ref 8.9–10.3)
Chloride: 106 mmol/L (ref 98–111)
Creatinine, Ser: 0.71 mg/dL (ref 0.44–1.00)
GFR calc Af Amer: >60 mL/min (ref >60)
GFR calc non Af Amer: >60 mL/min (ref >60)
Glucose, Bld: 99 mg/dL (ref 70–99)
Potassium: 3.3 mmol/L — ABNORMAL LOW (ref 3.5–5.1)
Sodium: 140 mmol/L (ref 135–145)
Total Bilirubin: 0.8 mg/dL (ref 0.3–1.2)
Total Protein: 6.1 g/dL — ABNORMAL LOW (ref 6.5–8.1)

## 2019-04-20 LAB — URINALYSIS, ROUTINE W REFLEX MICROSCOPIC
Bilirubin Urine: NEGATIVE
Glucose, UA: NEGATIVE mg/dL
Hgb urine dipstick: NEGATIVE
Ketones, ur: 5 mg/dL — AB
Nitrite: POSITIVE — AB
Protein, ur: 30 mg/dL — AB
Specific Gravity, Urine: 1.024 (ref 1.005–1.030)
pH: 5 (ref 5.0–8.0)

## 2019-04-20 LAB — BRAIN NATRIURETIC PEPTIDE: B Natriuretic Peptide: 265.4 pg/mL — ABNORMAL HIGH (ref 0.0–100.0)

## 2019-04-20 LAB — TROPONIN I (HIGH SENSITIVITY)
Troponin I (High Sensitivity): 18 ng/L — ABNORMAL HIGH (ref <18)
Troponin I (High Sensitivity): 28 ng/L — ABNORMAL HIGH (ref <18)

## 2019-04-20 MED ORDER — SODIUM CHLORIDE 0.9 % IV BOLUS
500.0000 mL | Freq: Once | INTRAVENOUS | Status: AC
  Administered 2019-04-20: 500 mL via INTRAVENOUS

## 2019-04-20 MED ORDER — DILTIAZEM HCL 25 MG/5ML IV SOLN
10.0000 mg | Freq: Once | INTRAVENOUS | Status: DC
  Filled 2019-04-20: qty 5

## 2019-04-20 MED ORDER — POTASSIUM CHLORIDE CRYS ER 20 MEQ PO TBCR
40.0000 meq | EXTENDED_RELEASE_TABLET | Freq: Once | ORAL | Status: AC
  Administered 2019-04-20: 40 meq via ORAL
  Filled 2019-04-20: qty 2

## 2019-04-20 MED ORDER — SODIUM CHLORIDE 0.9 % IV SOLN
1.0000 g | Freq: Once | INTRAVENOUS | Status: AC
  Administered 2019-04-20: 1 g via INTRAVENOUS
  Filled 2019-04-20: qty 10

## 2019-04-20 MED ORDER — DILTIAZEM HCL 25 MG/5ML IV SOLN
10.0000 mg | Freq: Once | INTRAVENOUS | Status: AC
  Administered 2019-04-20: 10 mg via INTRAVENOUS

## 2019-04-20 MED ORDER — CEPHALEXIN 500 MG PO CAPS: 500.0000 mg | ORAL_CAPSULE | Freq: Two times a day (BID) | ORAL | 0 refills | Status: AC

## 2019-04-20 NOTE — Discharge Instructions (Signed)
If you develop fever, vomiting, trouble breathing, chest pain, weakness, dizziness, or any other new/worsening symptoms, then return to the ER for evaluation

## 2019-04-20 NOTE — ED Triage Notes (Signed)
Pt brought to ED from home by EMS with c/o flutter in chest, gen weakness. Pt also reports incontinence x1 week. Hx of afib, 0 blood thinner use, treated with metoprolol. Pt states she took tylemol and nitro prior to EMS arrival with no changes in condition. EMS reports pt was in afib on arrival to home, gave 81mg  aspirin and pt took 25 mg metoprolol. EMS v/s: 99.1 F tympanic, afib 135 HR, BP 130/74, 98% RA, CBG 125. Pt currently in afib, A&Ox4, denies pain. Colostomy present, supplies at bedside.

## 2019-04-20 NOTE — ED Notes (Signed)
Essie Christine- POA- Would like a pt update.- [phone]

## 2019-04-20 NOTE — ED Provider Notes (Signed)
[facility] EMERGENCY DEPARTMENT Provider Note   CSN: [id number] Arrival date & time: 04/20/19  [id number]     History Chief Complaint  Patient presents with  . Atrial Fibrillation  . Fatigue    [name] is a 86 y.o. female.  HPI 86 year old female presents with palpitations and generalized weakness.  Patient started feeling bad this morning.  Feels recurrent palpitations like A. fib.  Also feels generally weak which typically happens with this.  Some pain at the base of her neck/occiput.  This also typically happens with A. fib.  No chest pain.  She took a nitroglycerin to see if that would help.  She has chronic shortness of breath but it is not worse.  She feels all over a weak.  No leg swelling.  Took her metoprolol this morning.  She is not on anticoagulation to her knowledge.  EMS noted her to be in A. fib with RVR.  Unable to get IV access.  Patient also notes she has had urinary incontinence for the last week.  No dysuria.   Past Medical History:  Diagnosis Date  . Arthritis    FINGERS   . CAD (coronary artery disease) CARDIOLOGIST-- DR Angelena Form   a. s/p INF STEMI 7/12: tx with BMS to RCA;  b. cath 08/26/10: pLAD 30%, mLAD 50%, D1 40%, pCFX 95%, mRCA occluded;   c. staged PCI of pCFX with BMS;   d. echo 7/12:   EF 60-65%, mild RAE, mild to moderate AI, mild MR, moderate TR, RVE, PASP 47  . Complication of anesthesia PT STATES "MADE HER FEEL CRAZY"  . Degeneration of eye    left eye cornea  . Dyspnea   . First degree heart block   . Heart palpitations PAC'S AND SVT RUN'S  PER CARDIOLOGIST NOTE  . History of ST elevation myocardial infarction (STEMI) 08-26-2010-- INFERIOR WALL   S/P PCI  BMS IN RCA AND PROX. CX  . Hyperlipidemia   . Hypertension   . Impaired hearing BILATERAL HEARING AIDS  . Osteopenia   . PAF (paroxysmal atrial fibrillation) ([place])   . PONV (postoperative nausea and vomiting)   . Pulmonary nodules BENIGN  PER CT  10-12-2010  . Rectal  Cancer 08/2010   adenocarcinoma   S/P PARTIAL PROCTECTOMY (NO CHEMO OR RADIATION)  . 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 S/P coronary artery stent placement 08/2010   X2  BM    Patient Active Problem List   Diagnosis Date Noted  . Slurred speech 02/23/2017  . PAF (paroxysmal atrial fibrillation) ([place]) 02/23/2017  . Depression with anxiety 02/23/2017  . TIA (transient ischemic attack)   . Fracture, tibia and fibula 07/06/2015  . Tibia/fibula fracture 07/03/2015  . Lung nodule seen on imaging study 09/05/2013  . Exposure to TB 09/05/2013  . SOB (shortness of breath) 10/02/2011  . Vascular skin changes 07/17/2011  . 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 Anorexia post-op 12/08/2010  . HTN (hypertension) 11/03/2010  . Dizziness 09/15/2010  . CAD (coronary artery disease)   . Hyperlipidemia   . Rectal cancer, pT2uN0(pNX) s/p TEM partial proctectomy 09/07/2010    Past Surgical History:  Procedure Laterality Date  . ABDOMINAL HYSTERECTOMY  1950's   AND APPENDECTOMY  . CATARACT EXTRACTION W/ INTRAOCULAR LENS  IMPLANT, BILATERAL    . CORONARY ANGIOPLASTY WITH STENT PLACEMENT  08-26-2010  DR Angelena Form  PCI, BM STENT IN RCA  . CORONARY ANGIOPLASTY WITH STENT PLACEMENT  08-29-2010  DR [place]   PCI, BM STENT IN PROXIMAL CIRCUMFLEX  . EXCISION BENIGN CYST RIGHT BREAST    . FISTULA PLUG N/A 06/21/2012   Procedure: insertion of FISTULA PLUG;  Surgeon: Leighton Ruff, MD;  Location: [facility]  Service: General;  Laterality: N/A;  . FLEXIBLE SIGMOIDOSCOPY N/A 04/02/2012   Procedure: FLEXIBLE SIGMOIDOSCOPY;  Surgeon: Leighton Ruff, MD;  Location: [facility];  Service: Endoscopy;  Laterality: N/A;  . KNEE SURGERY Right 1996  . LAPROSCOPY LYSIS ADHESIONS/ DRAINAGE OF PELVIC ABSCESS/ DIVERTING LOOP SIGMOID COLECTOMY  11-22-2010   POST OP RECTOVAGINAL FISTULA    . PARTIAL PROCTECTOMY BY TEM  11-17-2010   RECTAL CANCER  . RELEASE LEFT CARPAL TUNNEL/ OSTEOTOMY LEFT DISTAL RADIUS  11-10-2009  . STAPEDECTOMY  1970'S  . TONSILLECTOMY  CHILD  . TOTAL HIP ARTHROPLASTY Right 1992  . TRANSTHORACIC ECHOCARDIOGRAM  10-10-2011   NORMAL LV SIZE WITH MILD FOCAL BASAL SEPTAL HYPERTROPHY/ EF 55-60%/ NORMAL RV SIZE AND LVSF/ BIATRIAL ENLARGEMENT/ MILD TO MODERATE AI  &  TR  . TYMPANOPLASTY Left 12-23-2009     OB History   No obstetric history on file.     Family History  Problem Relation Age of Onset  . Kidney disease Mother   . Heart disease Father   . Cancer Brother     Social History   Tobacco Use  . Smoking status: Never Smoker  . Smokeless tobacco: Never Used  Substance Use Topics  . Alcohol use: No  . Drug use: No    Home Medications Prior to Admission medications   Medication Sig Start Date End Date Taking? Authorizing Provider  acetaminophen (TYLENOL) 325 MG tablet Take 650 mg by mouth every 6 (six) hours as needed for mild pain or headache.   Yes [provider]  aspirin EC 81 MG tablet Take 81 mg by mouth daily.   Yes [provider]  beta carotene w/minerals (OCUVITE) tablet Take 1 tablet by mouth daily.   Yes [provider]  BIOTIN PO Take 1 tablet by mouth daily.    Yes [provider]  cholecalciferol (VITAMIN D) 1000 units tablet Take 1,000 Units by mouth daily.   Yes [provider]  furosemide (LASIX) 40 MG tablet Take 20 mg by mouth daily.   Yes [provider]  metoprolol succinate (TOPROL XL) 25 MG 24 hr tablet Take 1 tablet (25 mg total) by mouth daily. 04/04/19  Yes Burnell Blanks, MD  nitroGLYCERIN (NITROSTAT) 0.4 MG SL tablet Place 0.4 mg under the tongue every 5 (five) minutes as needed for chest pain.   Yes [provider]  Polyethyl Glycol-Propyl Glycol (SYSTANE) 0.4-0.3 % GEL Place 1 drop into both eyes at bedtime.    Yes [provider]   PREMARIN vaginal cream Place 2 application vaginally as needed (Irritation).  09/24/17  Yes [provider]  sertraline (ZOLOFT) 25 MG tablet Take 0.5 tablets by mouth daily. 04/02/19  Yes [provider]  acetaminophen (TYLENOL) 500 MG tablet Take 2 tablets (1,000 mg total) by mouth every 6 (six) hours as needed for mild pain. Patient not taking: Reported on 04/20/2019 07/06/15   Rama, Venetia Maxon, MD  cephALEXin (KEFLEX) 500 MG capsule Take 1 capsule (500 mg total) by mouth 2 (two) times daily for 7 days. 04/20/19 04/27/19  Sherwood Gambler, MD  feeding supplement, ENSURE ENLIVE, (ENSURE ENLIVE) LIQD Take 237  mLs by mouth daily at 3 pm. Patient not taking: Reported on 04/20/2019 07/06/15   Rama, Venetia Maxon, MD  omeprazole (PRILOSEC) 40 MG capsule Take 40 mg by mouth as needed.  04/26/11  [provider]    Allergies    Effexor [venlafaxine hydrochloride], Penicillins, Sulfa antibiotics, Dicyclomine, and Hydrocodone  Review of Systems   Review of Systems  Respiratory: Negative for shortness of breath.   Cardiovascular: Positive for palpitations. Negative for chest pain and leg swelling.  Gastrointestinal: Negative for abdominal pain.  Genitourinary: Negative for dysuria.  Neurological: Positive for weakness.  All other systems reviewed and are negative.   Physical Exam Updated Vital Signs BP 131/65   Pulse 97   Temp 97.9 F (36.6 C) (Oral)   Resp 18   Ht 5\' 3"  (1.6 m)   Wt 48.5 kg   SpO2 99%   BMI 18.95 kg/m   Physical Exam Vitals and nursing note reviewed.  Constitutional:      Appearance: She is well-developed.  HENT:     Head: Normocephalic and atraumatic.     Right Ear: External ear normal.     Left Ear: External ear normal.     Nose: Nose normal.  Eyes:     General:        Right eye: No discharge.        Left eye: No discharge.     Pupils: Pupils are equal, round, and reactive to light.  Cardiovascular:     Rate and Rhythm: Tachycardia  present. Rhythm irregular.     Heart sounds: Normal heart sounds.  Pulmonary:     Effort: Pulmonary effort is normal.     Breath sounds: Normal breath sounds.  Abdominal:     Palpations: Abdomen is soft.     Tenderness: There is no abdominal tenderness.  Musculoskeletal:     Right lower leg: No edema.     Left lower leg: No edema.  Skin:    General: Skin is warm and dry.  Neurological:     Mental Status: She is alert.     Comments: 5/5 strength in all 4 extremities  Psychiatric:        Mood and Affect: Mood is not anxious.     ED Results / Procedures / Treatments   Labs (all labs ordered are listed, but only abnormal results are displayed) Labs Reviewed  COMPREHENSIVE METABOLIC PANEL - Abnormal; Notable for the following components:      Result Value   Potassium 3.3 (*)    Total Protein 6.1 (*)    Albumin 3.0 (*)    AST 71 (*)    ALT 50 (*)    All other components within normal limits  BRAIN NATRIURETIC PEPTIDE - Abnormal; Notable for the following components:   B Natriuretic Peptide 265.4 (*)    All other components within normal limits  CBC WITH DIFFERENTIAL/PLATELET - Abnormal; Notable for the following components:   RBC 3.73 (*)    Hemoglobin 11.5 (*)    HCT 35.5 (*)    All other components within normal limits  URINALYSIS, ROUTINE W REFLEX MICROSCOPIC - Abnormal; Notable for the following components:   APPearance HAZY (*)    Ketones, ur 5 (*)    Protein, ur 30 (*)    Nitrite POSITIVE (*)    Leukocytes,Ua TRACE (*)    Bacteria, UA MANY (*)    Non Squamous Epithelial 0-5 (*)    All other components within normal limits  TROPONIN  I (HIGH SENSITIVITY) - Abnormal; Notable for the following components:   Troponin I (High Sensitivity) 18 (*)    All other components within normal limits  TROPONIN I (HIGH SENSITIVITY) - Abnormal; Notable for the following components:   Troponin I (High Sensitivity) 28 (*)    All other components within normal limits  URINE CULTURE     EKG EKG Interpretation  Date/Time:  Sunday April 20 2019 07:29:02 EST Ventricular Rate:  120 PR Interval:    QRS Duration: 130 QT Interval:  355 QTC Calculation: 502 R Axis:   -51 Text Interpretation: Atrial fibrillation with rapid rate IVCD, consider atypical RBBB LVH with secondary repolarization abnormality Anterior infarct, old Confirmed by Sherwood Gambler [phone]) on 04/20/2019 7:40:27 AM   EKG Interpretation  Date/Time:  Sunday April 20 2019 09:24:13 EST Ventricular Rate:  119 PR Interval:    QRS Duration: 131 QT Interval:  363 QTC Calculation: 511 R Axis:   -53 Text Interpretation: Sinus tachycardia vs aflutter IVCD, consider atypical RBBB LVH with secondary repolarization abnormality Anterior infarct, old Confirmed by Sherwood Gambler [phone]) on 04/20/2019 10:23:12 AM        Radiology DG Chest Portable 1 View  Result Date: 04/20/2019 CLINICAL DATA:  Atrial fibrillation, weakness EXAM: PORTABLE CHEST 1 VIEW COMPARISON:  07/18/2018 FINDINGS: Hyperexpanded. Chronic interstitial prominence. No new consolidation or edema. No pleural effusion or pneumothorax. Stable cardiomediastinal contours. IMPRESSION: No acute process in the chest. Electronically Signed   By: Macy Mis M.D.   On: 04/20/2019 07:52    Procedures Procedures (including critical care time)  Medications Ordered in ED Medications  diltiazem (CARDIZEM) injection 10 mg (0 mg Intravenous Hold 04/20/19 1021)  sodium chloride 0.9 % bolus 500 mL (0 mLs Intravenous Stopped 04/20/19 0929)  diltiazem (CARDIZEM) injection 10 mg (10 mg Intravenous Given 04/20/19 0836)  potassium chloride SA (KLOR-CON) CR tablet 40 mEq (40 mEq Oral Given 04/20/19 1003)  sodium chloride 0.9 % bolus 500 mL (0 mLs Intravenous Stopped 04/20/19 1223)  cefTRIAXone (ROCEPHIN) 1 g in sodium chloride 0.9 % 100 mL IVPB (0 g Intravenous Stopped 04/20/19 1314)    ED Course  I have reviewed the triage vital signs and the nursing notes.  Pertinent labs  & imaging results that were available during my care of the patient were reviewed by me and considered in my medical decision making (see chart for details).    MDM Rules/Calculators/A&P                      Patient is feeling much better with heart rate control.  Given IV fluids and 1 dose of IV diltiazem.  Potassium repleted.  She is found to have a UTI which would account for her vague urinary tract infection symptoms.  Will send for culture.  Will treat with Keflex.  Her creatinine clearance is 32.  She has some mildly elevated troponins that are more likely from the high heart rate rather than ACS given no chest pain or shortness of breath.  I offered admission for supportive care and observation but she declines and wants to go home.  I think this is reasonable.  Treat for UTI.  Return precautions.  Final Clinical Impression(s) / ED Diagnoses Final diagnoses:  Acute UTI  Paroxysmal atrial fibrillation (HCC)  Hypokalemia    Rx / DC Orders ED Discharge Orders         Ordered    cephALEXin (KEFLEX) 500 MG capsule  2 times daily  04/20/19 [place], MD 04/20/19 [phone]

## 2019-04-22 LAB — URINE CULTURE: Culture: >=100000 — AB

## 2019-04-23 ENCOUNTER — Telehealth: Payer: Self-pay | Admitting: *Deleted

## 2019-04-23 NOTE — Telephone Encounter (Signed)
Post ED Visit - Positive Culture Follow-up  Culture report reviewed by antimicrobial stewardship pharmacist: [place] Team []  Elenor Quinones, Pharm.D. [x]  Heide Guile, Pharm.D., BCPS AQ-ID []  Parks Neptune, Pharm.D., BCPS []  Alycia Rossetti, Pharm.D., BCPS []  Pryor, Pharm.D., BCPS, AAHIVP []  Legrand Como, Pharm.D., BCPS, AAHIVP []  Salome Arnt, PharmD, BCPS []  Johnnette Gourd, PharmD, BCPS []  Hughes Better, PharmD, BCPS []  Leeroy Cha, PharmD []  Laqueta Linden, PharmD, BCPS []  Albertina Parr, PharmD  [place] Team []  Leodis Sias, PharmD []  Lindell Spar, PharmD []  Royetta Asal, PharmD []  Graylin Shiver, Rph []  Rema Fendt) Glennon Mac, PharmD []  Arlyn Dunning, PharmD []  Netta Cedars, PharmD []  Dia Sitter, PharmD []  Leone Haven, PharmD []  Gretta Arab, PharmD []  Theodis Shove, PharmD []  Peggyann Juba, PharmD []  Reuel Boom, PharmD   Positive urine culture Treated with Cephalexin, organism sensitive to the same and no further patient follow-up is required at this time.  Harlon Flor [facility] 04/23/2019, 8:35 AM

## 2019-07-19 ENCOUNTER — Emergency Department (HOSPITAL_COMMUNITY): Payer: Medicare PPO

## 2019-07-19 ENCOUNTER — Encounter (HOSPITAL_COMMUNITY): Payer: Self-pay

## 2019-07-19 ENCOUNTER — Other Ambulatory Visit: Payer: Self-pay

## 2019-07-19 ENCOUNTER — Emergency Department (HOSPITAL_COMMUNITY)
Admission: EM | Admit: 2019-07-19 | Discharge: 2019-07-20 | Disposition: A | Discharge status: Home / Self Care | Payer: Medicare PPO | Attending: Emergency Medicine | Admitting: Emergency Medicine

## 2019-07-19 DIAGNOSIS — E860 Dehydration: Secondary | ICD-10-CM | POA: Diagnosis not present

## 2019-07-19 DIAGNOSIS — N179 Acute kidney failure, unspecified: Secondary | ICD-10-CM | POA: Diagnosis not present

## 2019-07-19 DIAGNOSIS — R5383 Other fatigue: Secondary | ICD-10-CM | POA: Diagnosis not present

## 2019-07-19 DIAGNOSIS — I10 Essential (primary) hypertension: Secondary | ICD-10-CM | POA: Insufficient documentation

## 2019-07-19 DIAGNOSIS — R531 Weakness: Secondary | ICD-10-CM | POA: Diagnosis present

## 2019-07-19 DIAGNOSIS — Z96641 Presence of right artificial hip joint: Secondary | ICD-10-CM | POA: Insufficient documentation

## 2019-07-19 DIAGNOSIS — N3000 Acute cystitis without hematuria: Secondary | ICD-10-CM | POA: Diagnosis not present

## 2019-07-19 DIAGNOSIS — Z7982 Long term (current) use of aspirin: Secondary | ICD-10-CM | POA: Insufficient documentation

## 2019-07-19 DIAGNOSIS — Z79899 Other long term (current) drug therapy: Secondary | ICD-10-CM | POA: Diagnosis not present

## 2019-07-19 DIAGNOSIS — I2510 Atherosclerotic heart disease of native coronary artery without angina pectoris: Secondary | ICD-10-CM | POA: Diagnosis not present

## 2019-07-19 LAB — CBC WITH DIFFERENTIAL/PLATELET
Abs Immature Granulocytes: 0.03 10*3/uL (ref 0.00–0.07)
Basophils Absolute: 0 10*3/uL (ref 0.0–0.1)
Basophils Relative: 0 %
Eosinophils Absolute: 0.2 10*3/uL (ref 0.0–0.5)
Eosinophils Relative: 2 %
HCT: 38.6 % (ref 36.0–46.0)
Hemoglobin: 12.5 g/dL (ref 12.0–15.0)
Immature Granulocytes: 0 %
Lymphocytes Relative: 9 %
Lymphs Abs: 0.7 10*3/uL (ref 0.7–4.0)
MCH: 31.4 pg (ref 26.0–34.0)
MCHC: 32.4 g/dL (ref 30.0–36.0)
MCV: 97 fL (ref 80.0–100.0)
Monocytes Absolute: 0.4 10*3/uL (ref 0.1–1.0)
Monocytes Relative: 4 %
Neutro Abs: 6.6 10*3/uL (ref 1.7–7.7)
Neutrophils Relative %: 85 %
Platelets: 214 10*3/uL (ref 150–400)
RBC: 3.98 MIL/uL (ref 3.87–5.11)
RDW: 12.5 % (ref 11.5–15.5)
WBC: 7.9 10*3/uL (ref 4.0–10.5)
nRBC: 0 % (ref 0.0–0.2)

## 2019-07-19 LAB — URINALYSIS, ROUTINE W REFLEX MICROSCOPIC
Bilirubin Urine: NEGATIVE
Glucose, UA: NEGATIVE mg/dL
Hgb urine dipstick: NEGATIVE
Ketones, ur: NEGATIVE mg/dL
Nitrite: NEGATIVE
Protein, ur: NEGATIVE mg/dL
Specific Gravity, Urine: 1.016 (ref 1.005–1.030)
WBC, UA: >50 WBC/hpf — ABNORMAL HIGH (ref 0–5)
pH: 5 (ref 5.0–8.0)

## 2019-07-19 LAB — COMPREHENSIVE METABOLIC PANEL
ALT: 25 U/L (ref 0–44)
AST: 58 U/L — ABNORMAL HIGH (ref 15–41)
Albumin: 3.9 g/dL (ref 3.5–5.0)
Alkaline Phosphatase: 78 U/L (ref 38–126)
Anion gap: 9 (ref 5–15)
BUN: 29 mg/dL — ABNORMAL HIGH (ref 8–23)
CO2: 22 mmol/L (ref 22–32)
Calcium: 9.5 mg/dL (ref 8.9–10.3)
Chloride: 104 mmol/L (ref 98–111)
Creatinine, Ser: 1.1 mg/dL — ABNORMAL HIGH (ref 0.44–1.00)
GFR calc Af Amer: 47 mL/min — ABNORMAL LOW (ref >60)
GFR calc non Af Amer: 41 mL/min — ABNORMAL LOW (ref >60)
Glucose, Bld: 93 mg/dL (ref 70–99)
Potassium: 4.2 mmol/L (ref 3.5–5.1)
Sodium: 135 mmol/L (ref 135–145)
Total Bilirubin: 1.3 mg/dL — ABNORMAL HIGH (ref 0.3–1.2)
Total Protein: 7.8 g/dL (ref 6.5–8.1)

## 2019-07-19 LAB — PROTIME-INR
INR: 1 (ref 0.8–1.2)
Prothrombin Time: 12.3 seconds (ref 11.4–15.2)

## 2019-07-19 LAB — LACTIC ACID, PLASMA: Lactic Acid, Venous: 1.4 mmol/L (ref 0.5–1.9)

## 2019-07-19 MED ORDER — SODIUM CHLORIDE 0.9 % IV BOLUS
500.0000 mL | Freq: Once | INTRAVENOUS | Status: AC
  Administered 2019-07-19: 500 mL via INTRAVENOUS

## 2019-07-19 MED ORDER — ACETAMINOPHEN 500 MG PO TABS
500.0000 mg | ORAL_TABLET | Freq: Once | ORAL | Status: AC
  Administered 2019-07-19: 500 mg via ORAL
  Filled 2019-07-19: qty 1

## 2019-07-19 NOTE — ED Triage Notes (Signed)
Pt arrives POV for eval of generalized weakness and malaise. Pt reports "I feel like I have the flu". Has not checked temp at home, but reports that she feels as though she is febrile. Pt was dx'd w/ UTI on Monday and started on Cipro and continued to feel poorly throughout the week. States she contacted her PCP yesterday and they added macrobid, however pt doesn't feel improved

## 2019-07-20 NOTE — ED Notes (Signed)
Patient verbalizes understanding of discharge instructions. Opportunity for questioning and answers were provided. Armband removed by staff, pt discharged from ED. Pt. ambulatory and discharged home.  

## 2019-07-20 NOTE — ED Provider Notes (Signed)
[facility] EMERGENCY DEPARTMENT Provider Note  CSN: [id number] Arrival date & time: 07/19/19 1824  Chief Complaint(s) Weakness  HPI [name] is a 86 y.o. female with a past medical history listed below who is currently being treated for urinary tract infection presents to the emergency department with generalized fatigue for several days that worsened today. Patient reports being diagnosed 1 week ago and started on antibiotic which she did not tolerate (she believes it was cefdinir). The antibiotic gave the patient nausea, vomiting, diarrhea. She was subsequently changed to ciprofloxacin. Since she did not improve, she was seen yesterday and had urine cultures obtained. At that time she was transition from ciprofloxacin to [place]. She denies any known fevers but endorses chills that began today. She does not have any more of the vomiting or diarrhea. She denies any associated chest pain or shortness of breath. No abdominal pain. No cough or congestion. No other physical complaints.  HPI  Past Medical History Past Medical History:  Diagnosis Date  . Arthritis    FINGERS   . CAD (coronary artery disease) CARDIOLOGIST-- DR Angelena Form   a. s/p INF STEMI 7/12: tx with BMS to RCA;  b. cath 08/26/10: pLAD 30%, mLAD 50%, D1 40%, pCFX 95%, mRCA occluded;   c. staged PCI of pCFX with BMS;   d. echo 7/12:   EF 60-65%, mild RAE, mild to moderate AI, mild MR, moderate TR, RVE, PASP 47  . Complication of anesthesia PT STATES "MADE HER FEEL CRAZY"  . Degeneration of eye    left eye cornea  . Dyspnea   . First degree heart block   . Heart palpitations PAC'S AND SVT RUN'S  PER CARDIOLOGIST NOTE  . History of ST elevation myocardial infarction (STEMI) 08-26-2010-- INFERIOR WALL   S/P PCI  BMS IN RCA AND PROX. CX  . Hyperlipidemia   . Hypertension   . Impaired hearing BILATERAL HEARING AIDS  . Osteopenia   . PAF (paroxysmal atrial fibrillation) ([place])   . PONV (postoperative  nausea and vomiting)   . Pulmonary nodules BENIGN  PER CT  10-12-2010  . Rectal Cancer 08/2010   adenocarcinoma   S/P PARTIAL PROCTECTOMY (NO CHEMO OR RADIATION)  . 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 S/P coronary artery stent placement 08/2010   X2  BM   Patient Active Problem List   Diagnosis Date Noted  . Slurred speech 02/23/2017  . PAF (paroxysmal atrial fibrillation) ([place]) 02/23/2017  . Depression with anxiety 02/23/2017  . TIA (transient ischemic attack)   . Fracture, tibia and fibula 07/06/2015  . Tibia/fibula fracture 07/03/2015  . Lung nodule seen on imaging study 09/05/2013  . Exposure to TB 09/05/2013  . SOB (shortness of breath) 10/02/2011  . Vascular skin changes 07/17/2011  . 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 Anorexia post-op 12/08/2010  . HTN (hypertension) 11/03/2010  . Dizziness 09/15/2010  . CAD (coronary artery disease)   . Hyperlipidemia   . Rectal cancer, pT2uN0(pNX) s/p TEM partial proctectomy 09/07/2010   Home Medication(s) Prior to Admission medications   Medication Sig Start Date End Date Taking? Authorizing Provider  acetaminophen (TYLENOL) 500 MG tablet Take 2 tablets (1,000 mg total) by mouth every 6 (six) hours as needed for mild pain. 07/06/15  Yes Rama, Venetia Maxon, MD  aspirin EC 81 MG tablet Take 81 mg by mouth daily.   Yes  [provider]  beta carotene w/minerals (OCUVITE) tablet Take 1 tablet by mouth daily.   Yes [provider]  BIOTIN PO Take 1 tablet by mouth daily.    Yes [provider]  cholecalciferol (VITAMIN D) 1000 units tablet Take 1,000 Units by mouth daily.   Yes [provider]  furosemide (LASIX) 40 MG tablet Take 40 mg by mouth daily.    Yes [provider]  metoprolol succinate (TOPROL XL) 25 MG 24 hr tablet Take 1 tablet (25 mg total) by mouth daily. 04/04/19   Yes Burnell Blanks, MD  nitrofurantoin, macrocrystal-monohydrate, (MACROBID) 100 MG capsule Take 100 mg by mouth 2 (two) times daily. For 10 days. 07/18/19  Yes [provider]  nitroGLYCERIN (NITROSTAT) 0.4 MG SL tablet Place 0.4 mg under the tongue every 5 (five) minutes as needed for chest pain.   Yes [provider]  nystatin-triamcinolone (MYCOLOG II) cream Apply 1 application topically 2 (two) times daily as needed for rash.   Yes [provider]  Polyethyl Glycol-Propyl Glycol (SYSTANE) 0.4-0.3 % GEL Place 1 drop into both eyes at bedtime.    Yes [provider]  sertraline (ZOLOFT) 25 MG tablet Take 0.5 tablets by mouth daily. 04/02/19  Yes [provider]  feeding supplement, ENSURE ENLIVE, (ENSURE ENLIVE) LIQD Take 237 mLs by mouth daily at 3 pm. Patient not taking: Reported on 04/20/2019 07/06/15   Rama, Venetia Maxon, MD  omeprazole (PRILOSEC) 40 MG capsule Take 40 mg by mouth as needed.  04/26/11  [provider]                                                                                                                                    Past Surgical History Past Surgical History:  Procedure Laterality Date  . ABDOMINAL HYSTERECTOMY  1950's   AND APPENDECTOMY  . CATARACT EXTRACTION W/ INTRAOCULAR LENS  IMPLANT, BILATERAL    . CORONARY ANGIOPLASTY WITH STENT PLACEMENT  08-26-2010  DR [place]   PCI, BM STENT IN RCA  . CORONARY ANGIOPLASTY WITH STENT PLACEMENT  08-29-2010  DR [place]   PCI, BM STENT IN PROXIMAL CIRCUMFLEX  . EXCISION BENIGN CYST RIGHT BREAST    . FISTULA PLUG N/A 06/21/2012   Procedure: insertion of FISTULA PLUG;  Surgeon: Leighton Ruff, MD;  Location: [facility];  Service: General;  Laterality: N/A;  . FLEXIBLE SIGMOIDOSCOPY N/A 04/02/2012   Procedure: FLEXIBLE SIGMOIDOSCOPY;  Surgeon: Leighton Ruff, MD;  Location: [facility];  Service: Endoscopy;  Laterality: N/A;  . KNEE SURGERY Right  1996  . LAPROSCOPY LYSIS ADHESIONS/ DRAINAGE OF PELVIC ABSCESS/ DIVERTING LOOP SIGMOID COLECTOMY  11-22-2010   POST OP RECTOVAGINAL FISTULA  . PARTIAL PROCTECTOMY BY TEM  11-17-2010   RECTAL CANCER  . RELEASE LEFT CARPAL TUNNEL/ OSTEOTOMY LEFT DISTAL RADIUS  11-10-2009  . STAPEDECTOMY  1970'S  . TONSILLECTOMY  CHILD  . TOTAL  HIP ARTHROPLASTY Right 1992  . TRANSTHORACIC ECHOCARDIOGRAM  10-10-2011   NORMAL LV SIZE WITH MILD FOCAL BASAL SEPTAL HYPERTROPHY/ EF 55-60%/ NORMAL RV SIZE AND LVSF/ BIATRIAL ENLARGEMENT/ MILD TO MODERATE AI  &  TR  . TYMPANOPLASTY Left 12-23-2009   Family History Family History  Problem Relation Age of Onset  . Kidney disease Mother   . Heart disease Father   . Cancer Brother     Social History Social History   Tobacco Use  . Smoking status: Never Smoker  . Smokeless tobacco: Never Used  Substance Use Topics  . Alcohol use: No  . Drug use: No   Allergies Effexor [venlafaxine hydrochloride], Penicillins, Sulfa antibiotics, Dicyclomine, Lactose intolerance (gi), and Hydrocodone  Review of Systems Review of Systems All other systems are reviewed and are negative for acute change except as noted in the HPI  Physical Exam Vital Signs  I have reviewed the triage vital signs BP (!) 121/98   Pulse 67   Temp 98 F (36.7 C) (Oral)   Resp 19   Ht 5\' 3"  (1.6 m)   Wt 48.5 kg   SpO2 99%   BMI 18.94 kg/m   Physical Exam Vitals reviewed.  Constitutional:      General: She is not in acute distress.    Appearance: She is well-developed. She is not diaphoretic.  HENT:     Head: Normocephalic and atraumatic.     Nose: Nose normal.     Mouth/Throat:     Mouth: Mucous membranes are moist.     Comments: Dry lips Eyes:     General: No scleral icterus.       Right eye: No discharge.        Left eye: No discharge.     Conjunctiva/sclera: Conjunctivae normal.     Pupils: Pupils are equal, round, and reactive to light.  Cardiovascular:     Rate and  Rhythm: Normal rate and regular rhythm.     Heart sounds: No murmur. No friction rub. No gallop.   Pulmonary:     Effort: Pulmonary effort is normal. No respiratory distress.     Breath sounds: Normal breath sounds. No stridor. No rales.  Abdominal:     General: There is no distension.     Palpations: Abdomen is soft.     Tenderness: There is no abdominal tenderness.  Musculoskeletal:        General: No tenderness.     Cervical back: Normal range of motion and neck supple.  Skin:    General: Skin is warm and dry.     Findings: No erythema or rash.  Neurological:     Mental Status: She is alert and oriented to person, place, and time.     ED Results and Treatments Labs (all labs ordered are listed, but only abnormal results are displayed) Labs Reviewed  COMPREHENSIVE METABOLIC PANEL - Abnormal; Notable for the following components:      Result Value   BUN 29 (*)    Creatinine, Ser 1.10 (*)    AST 58 (*)    Total Bilirubin 1.3 (*)    GFR calc non Af Amer 41 (*)    GFR calc Af Amer 47 (*)    All other components within normal limits  URINALYSIS, ROUTINE W REFLEX MICROSCOPIC - Abnormal; Notable for the following components:   APPearance HAZY (*)    Leukocytes,Ua LARGE (*)    WBC, UA >50 (*)    Bacteria, UA RARE (*)  All other components within normal limits  CULTURE, BLOOD (ROUTINE X 2)  CULTURE, BLOOD (ROUTINE X 2)  LACTIC ACID, PLASMA  CBC WITH DIFFERENTIAL/PLATELET  PROTIME-INR                                                                                                                         EKG  EKG Interpretation  Date/Time:  Saturday July 19 2019 23:45:34 EDT Ventricular Rate:  102 PR Interval:    QRS Duration: 134 QT Interval:  387 QTC Calculation: 505 R Axis:   -14 Text Interpretation: Sinus or ectopic atrial tachycardia IVCD, consider atypical RBBB LVH with secondary repolarization abnormality Anterior infarct, old Baseline wander in lead(s) V6 No  significant change since last tracing Confirmed by Addison Lank [phone]) on 07/20/2019 12:58:02 AM      Radiology DG Chest 2 View  Result Date: 07/19/2019 CLINICAL DATA:  Suspected sepsis, generalized weakness and malaise EXAM: CHEST - 2 VIEW COMPARISON:  Radiograph 05/12/2017, CT 11/03/2014 FINDINGS: Fairly well-defined convex opacity seen along the right hemidiaphragm likely related to scarring seen on prior CT. There are chronically coarsened interstitial changes throughout the lungs with chronic hyperinflation and flattening of the diaphragms. No other focal opacity is seen. Extensive coronary artery calcifications and dense mitral annular calcifications are seen. The aorta is calcified. The remaining cardiomediastinal contours are unremarkable. No pneumothorax or effusion. No acute osseous or soft tissue abnormality. Degenerative changes are present in the imaged spine and shoulders. IMPRESSION: 1. Chronic interstitial changes and hyperinflation. 2. No acute cardiopulmonary abnormality. 3. Density in the right lung base likely related to scarring seen on comparison study. 4. Extensive coronary artery calcifications and dense mitral annular calcifications. Electronically Signed   By: Lovena Le M.D.   On: 07/19/2019 20:00    Pertinent labs & imaging results that were available during my care of the patient were reviewed by me and considered in my medical decision making (see chart for details).  Medications Ordered in ED Medications  sodium chloride 0.9 % bolus 500 mL (0 mLs Intravenous Stopped 07/20/19 0048)  acetaminophen (TYLENOL) tablet 500 mg (500 mg Oral Given 07/19/19 2348)                                                                                                                                    Procedures Procedures  (including critical care time)  Medical Decision Making /  ED Course I have reviewed the nursing notes for this encounter and the patient's prior records (if  available in EHR or on provided paperwork).   ADLEIGH MCMASTERS was evaluated in Emergency Department on 07/20/2019 for the symptoms described in the history of present illness. She was evaluated in the context of the global COVID-19 pandemic, which necessitated consideration that the patient might be at risk for infection with the SARS-CoV-2 virus that causes COVID-19. Institutional protocols and algorithms that pertain to the evaluation of patients at risk for COVID-19 are in a state of rapid change based on information released by regulatory bodies including the CDC and federal and state organizations. These policies and algorithms were followed during the patient's care in the ED.  Work-up is consistent with a urinary tract infection and likely dehydration with mild AKI. This may be due to decrease oral hydration with superimposed Lasix use. Patient is not septic and is hemodynamically stable. There is no anemia on CBC or leukocytosis. CXR w/o PNA.  She was provided with small IV fluid bolus and Tylenol. Patient was able to tolerate oral intake.  Since cultures were sent, we did not send urine cultures today.  On review of records, 3 months ago patient had Macrobid susceptible E. Coli. E. coli at that time was resistant to Cipro. She was instructed to continue the use of Macrobid and to follow-up closely with a primary care provider for culture results.      Final Clinical Impression(s) / ED Diagnoses Final diagnoses:  Other fatigue  Acute cystitis without hematuria  AKI (acute kidney injury) ([place])  Dehydration   The patient appears reasonably screened and/or stabilized for discharge and I doubt any other medical condition or other [facility] requiring further screening, evaluation, or treatment in the ED at this time prior to discharge. Safe for discharge with strict return precautions.  Disposition: Discharge  Condition: Good  I have discussed the results, Dx and Tx plan with the  patient/family who expressed understanding and agree(s) with the plan. Discharge instructions discussed at length. The patient/family was given strict return precautions who verbalized understanding of the instructions. No further questions at time of discharge.    ED Discharge Orders    None       Follow Up: Leonard Downing, MD [place] [address] [phone]  Call in 2 days for urine culture results and enquire about short Lasix vacation.     This chart was dictated using voice recognition software.  Despite best efforts to proofread,  errors can occur which can change the documentation meaning.   Fatima Blank, MD 07/20/19 [phone]

## 2019-07-24 LAB — CULTURE, BLOOD (ROUTINE X 2)
Culture: NO GROWTH
Culture: NO GROWTH
Special Requests: ADEQUATE
Special Requests: ADEQUATE

## 2019-08-13 ENCOUNTER — Other Ambulatory Visit: Payer: Self-pay

## 2019-08-13 ENCOUNTER — Encounter: Payer: Self-pay | Admitting: Cardiovascular Disease

## 2019-08-13 ENCOUNTER — Ambulatory Visit: Payer: Medicare PPO | Admitting: Cardiovascular Disease

## 2019-08-13 VITALS — BP 134/58 | HR 63 | Ht 63.0 in | Wt 102.0 lb

## 2019-08-13 DIAGNOSIS — I351 Nonrheumatic aortic (valve) insufficiency: Secondary | ICD-10-CM

## 2019-08-13 DIAGNOSIS — I10 Essential (primary) hypertension: Secondary | ICD-10-CM

## 2019-08-13 DIAGNOSIS — I2510 Atherosclerotic heart disease of native coronary artery without angina pectoris: Secondary | ICD-10-CM

## 2019-08-13 DIAGNOSIS — I5032 Chronic diastolic (congestive) heart failure: Secondary | ICD-10-CM

## 2019-08-13 DIAGNOSIS — I480 Paroxysmal atrial fibrillation: Secondary | ICD-10-CM

## 2019-08-13 DIAGNOSIS — I2583 Coronary atherosclerosis due to lipid rich plaque: Secondary | ICD-10-CM

## 2019-08-13 NOTE — Patient Instructions (Signed)
Medication Instructions:  No changes *If you need a refill on your cardiac medications before your next appointment, please call your pharmacy*   Lab Work: none If you have labs (blood work) drawn today and your tests are completely normal, you will receive your results only by: . MyChart Message (if you have MyChart) OR . A paper copy in the mail If you have any lab test that is abnormal or we need to change your treatment, we will call you to review the results.   Testing/Procedures: none   Follow-Up: At [facility], you and your health needs are our priority.  As part of our continuing mission to provide you with exceptional heart care, we have created designated Provider Care Teams.  These Care Teams include your primary Cardiologist (physician) and Advanced Practice Providers (APPs -  Physician Assistants and Nurse Practitioners) who all work together to provide you with the care you need, when you need it.  We recommend signing up for the patient portal called "MyChart".  Sign up information is provided on this After Visit Summary.  MyChart is used to connect with patients for Virtual Visits (Telemedicine).  Patients are able to view lab/test results, encounter notes, upcoming appointments, etc.  Non-urgent messages can be sent to your provider as well.   To learn more about what you can do with MyChart, go to [email or web address].    Your next appointment:   6 month(s)  The format for your next appointment:   In Person  Provider:   You may see Christopher McAlhany, MD or one of the following Advanced Practice Providers on your designated Care Team:    Dayna Dunn, PA-C  Michele Lenze, PA-C  Other Instructions   

## 2019-08-13 NOTE — Progress Notes (Signed)
Chief Complaint  Patient presents with  . Follow-up    CAD   History of Present Illness: 86 yo female with history of CAD, aortic valve insufficiency, mitral regurgitation, rectal cancer and atrial flutter who is here today for cardiac follow up. She was admitted to [facility] July 2012 with an inferior STEMI and was found to have a total occlusion of the proximal RCA which was treated with a bare metal stent. There was moderate residual disease in the mid RCA and severe disease in the proximal Circumflex artery. A bare metal stent was placed in the circumflex artery in a staged procedure. Echo March 2016 with normal LV function, moderate AI, mild MR. She is known to have SVT, PVCs and PACs documented by cardiac monitor in 2013. She was found to have atrial flutter in September 2017. Anti-coagulation was not started given advanced age and fall risk. She has been on ASA and a beta blocker. She was admitted with a probable TIA in January 2018 and has recovered neurologically. Echo January 2019 with normal LV systolic function, WUJW=11-91%. There was grade 3 diastolic dysfunction and mild to moderate aortic valve insufficiency with trivial MR.She was seen in the ED on 05/12/17 with chest pain. Troponin was negative. She had no ischemic EKG changes. She was not admitted. She has had severe fatigue and dyspnea for the past few years.  Some improvement on Lasix.   She is here today for follow up. The patient denies any chest pain, lower extremity edema, orthopnea, PND, dizziness, near syncope or syncope. Baseline dyspnea, no changes. Occasional palpitations. LE edema controlled with Lasix.   Primary Care Physician: Leonard Downing, MD  Past Medical History:  Diagnosis Date  . Arthritis    FINGERS   . CAD (coronary artery disease) CARDIOLOGIST-- DR Angelena Form   a. s/p INF STEMI 7/12: tx with BMS to RCA;  b. cath 08/26/10: pLAD 30%, mLAD 50%, D1 40%, pCFX 95%, mRCA occluded;   c. staged PCI of pCFX with  BMS;   d. echo 7/12:   EF 60-65%, mild RAE, mild to moderate AI, mild MR, moderate TR, RVE, PASP 47  . Complication of anesthesia PT STATES "MADE HER FEEL CRAZY"  . Degeneration of eye    left eye cornea  . Dyspnea   . First degree heart block   . Heart palpitations PAC'S AND SVT RUN'S  PER CARDIOLOGIST NOTE  . History of ST elevation myocardial infarction (STEMI) 08-26-2010-- INFERIOR WALL   S/P PCI  BMS IN RCA AND PROX. CX  . Hyperlipidemia   . Hypertension   . Impaired hearing BILATERAL HEARING AIDS  . Osteopenia   . PAF (paroxysmal atrial fibrillation) ([place])   . PONV (postoperative nausea and vomiting)   . Pulmonary nodules BENIGN  PER CT  10-12-2010  . Rectal Cancer 08/2010   adenocarcinoma   S/P PARTIAL PROCTECTOMY (NO CHEMO OR RADIATION)  . 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 S/P coronary artery stent placement 08/2010   X2  BM    Past Surgical History:  Procedure Laterality Date  . ABDOMINAL HYSTERECTOMY  1950's   AND APPENDECTOMY  . CATARACT EXTRACTION W/ INTRAOCULAR LENS  IMPLANT, BILATERAL    . CORONARY ANGIOPLASTY WITH STENT PLACEMENT  08-26-2010  DR [place]   PCI, BM STENT IN RCA  . CORONARY ANGIOPLASTY WITH STENT PLACEMENT  08-29-2010  DR [place]   PCI, BM STENT IN  PROXIMAL CIRCUMFLEX  . EXCISION BENIGN CYST RIGHT BREAST    . FISTULA PLUG N/A 06/21/2012   Procedure: insertion of FISTULA PLUG;  Surgeon: Leighton Ruff, MD;  Location: [facility];  Service: General;  Laterality: N/A;  . FLEXIBLE SIGMOIDOSCOPY N/A 04/02/2012   Procedure: FLEXIBLE SIGMOIDOSCOPY;  Surgeon: Leighton Ruff, MD;  Location: [facility];  Service: Endoscopy;  Laterality: N/A;  . KNEE SURGERY Right 1996  . LAPROSCOPY LYSIS ADHESIONS/ DRAINAGE OF PELVIC ABSCESS/ DIVERTING LOOP SIGMOID COLECTOMY  11-22-2010   POST OP RECTOVAGINAL FISTULA  . PARTIAL PROCTECTOMY BY TEM  11-17-2010   RECTAL  CANCER  . RELEASE LEFT CARPAL TUNNEL/ OSTEOTOMY LEFT DISTAL RADIUS  11-10-2009  . STAPEDECTOMY  1970'S  . TONSILLECTOMY  CHILD  . TOTAL HIP ARTHROPLASTY Right 1992  . TRANSTHORACIC ECHOCARDIOGRAM  10-10-2011   NORMAL LV SIZE WITH MILD FOCAL BASAL SEPTAL HYPERTROPHY/ EF 55-60%/ NORMAL RV SIZE AND LVSF/ BIATRIAL ENLARGEMENT/ MILD TO MODERATE AI  &  TR  . TYMPANOPLASTY Left 12-23-2009    Current Outpatient Medications  Medication Sig Dispense Refill  . acetaminophen (TYLENOL) 500 MG tablet Take 2 tablets (1,000 mg total) by mouth every 6 (six) hours as needed for mild pain. 30 tablet 0  . aspirin EC 81 MG tablet Take 81 mg by mouth daily.    . beta carotene w/minerals (OCUVITE) tablet Take 1 tablet by mouth daily.    Marland Kitchen BIOTIN PO Take 1 tablet by mouth daily.     . cholecalciferol (VITAMIN D) 1000 units tablet Take 1,000 Units by mouth daily.    . feeding supplement, ENSURE ENLIVE, (ENSURE ENLIVE) LIQD Take 237 mLs by mouth daily at 3 pm. 237 mL 12  . furosemide (LASIX) 40 MG tablet Take 40 mg by mouth daily.     . melatonin 5 MG TABS Take 5 mg by mouth at bedtime.    . metoprolol succinate (TOPROL XL) 25 MG 24 hr tablet Take 1 tablet (25 mg total) by mouth daily. 30 tablet 6  . nitrofurantoin, macrocrystal-monohydrate, (MACROBID) 100 MG capsule Take 100 mg by mouth 2 (two) times daily. For 10 days.    . nitroGLYCERIN (NITROSTAT) 0.4 MG SL tablet Place 0.4 mg under the tongue every 5 (five) minutes as needed for chest pain.    Marland Kitchen nystatin-triamcinolone (MYCOLOG II) cream Apply 1 application topically 2 (two) times daily as needed for rash.    Vladimir Faster Glycol-Propyl Glycol (SYSTANE) 0.4-0.3 % GEL Place 1 drop into both eyes at bedtime.     . sertraline (ZOLOFT) 25 MG tablet Take 0.5 tablets by mouth daily.     No current facility-administered medications for this visit.    Allergies  Allergen Reactions  . Effexor [Venlafaxine Hydrochloride] Other (See Comments)    Reaction not recalled  by the patient  . Penicillins Shortness Of Breath    Has patient had a PCN reaction causing immediate rash, facial/tongue/throat swelling, SOB or lightheadedness with hypotension: Yes Has patient had a PCN reaction causing severe rash involving mucus membranes or skin necrosis: No Has patient had a PCN reaction that required hospitalization: No Has patient had a PCN reaction occurring within the last 10 years: No If all of the above answers are "NO", then may proceed with Cephalosporin use.   . Sulfa Antibiotics Nausea Only  . Dicyclomine Other (See Comments)    Caused confusion  . Lactose Intolerance (Gi) Diarrhea  . Hydrocodone Nausea Only    Social History   Socioeconomic History  .  Marital status: Widowed    Spouse name: Not on file  . Number of children: Not on file  . Years of education: Not on file  . Highest education level: Not on file  Occupational History  . Not on file  Tobacco Use  . Smoking status: Never Smoker  . Smokeless tobacco: Never Used  Substance and Sexual Activity  . Alcohol use: No  . Drug use: No  . Sexual activity: Not on file  Other Topics Concern  . Not on file  Social History Narrative  . Not on file   Social Determinants of Health   Financial Resource Strain:   . Difficulty of Paying Living Expenses:   Food Insecurity:   . Worried About Charity fundraiser in the Last Year:   . Arboriculturist in the Last Year:   Transportation Needs:   . Film/video editor (Medical):   Marland Kitchen Lack of Transportation (Non-Medical):   Physical Activity:   . Days of Exercise per Week:   . Minutes of Exercise per Session:   Stress:   . Feeling of Stress :   Social Connections:   . Frequency of Communication with Friends and Family:   . Frequency of Social Gatherings with Friends and Family:   . Attends Religious Services:   . Active Member of Clubs or Organizations:   . Attends Archivist Meetings:   Marland Kitchen Marital Status:   Intimate Partner  Violence:   . Fear of Current or Ex-Partner:   . Emotionally Abused:   Marland Kitchen Physically Abused:   . Sexually Abused:     Family History  Problem Relation Age of Onset  . Kidney disease Mother   . Heart disease Father   . Cancer Brother     Review of Systems:  As stated in the HPI and otherwise negative.   BP (!) 134/58   Pulse 63   Ht 5\' 3"  (1.6 m)   Wt 102 lb (46.3 kg)   SpO2 100%   BMI 18.07 kg/m   Physical Examination:  General: Well developed, well nourished, NAD  HEENT: OP clear, mucus membranes moist  SKIN: warm, dry. No rashes. Neuro: No focal deficits  Musculoskeletal: Muscle strength 5/5 all ext  Psychiatric: Mood and affect normal  Neck: No JVD, no carotid bruits, no thyromegaly, no lymphadenopathy.  Lungs:Clear bilaterally, no wheezes, rhonci, crackles Cardiovascular: Regular rate and rhythm. No murmurs, gallops or rubs. Abdomen:Soft. Bowel sounds present. Non-tender.  Extremities: No lower extremity edema. Pulses are 2 + in the bilateral DP/PT.  Echo 02/23/17: - Left ventricle: The cavity size was normal. There was moderate   focal basal and mild concentric hypertrophy. Systolic function   was normal. The estimated ejection fraction was in the range of   60% to 65%. Wall motion was normal; there were no regional wall   motion abnormalities. There was a reduced contribution of atrial   contraction to ventricular filling, due to increased ventricular   diastolic pressure or atrial contractile dysfunction. Doppler   parameters are consistent with a reversible restrictive pattern,   indicative of decreased left ventricular diastolic compliance   and/or increased left atrial pressure (grade 3 diastolic   dysfunction). Doppler parameters are consistent with high   ventricular filling pressure. - Aortic valve: There was mild to moderate regurgitation. - Mitral valve: Severely calcified annulus. - Left atrium: The atrium was mildly dilated. - Pulmonic valve: There  was trivial regurgitation. - Pulmonary arteries: PA peak pressure:  32 mm Hg (S).  EKG:  EKG is not  ordered today. The ekg ordered today demonstrates  Recent Labs: 04/20/2019: B Natriuretic Peptide 265.4 07/19/2019: ALT 25; BUN 29; Creatinine, Ser 1.10; Hemoglobin 12.5; Platelets 214; Potassium 4.2; Sodium 135   Lipid Panel    Component Value Date/Time   CHOL 191 02/23/2017 0503   TRIG 77 02/23/2017 0503   HDL 67 02/23/2017 0503   CHOLHDL 2.9 02/23/2017 0503   VLDL 15 02/23/2017 0503   LDLCALC 109 (H) 02/23/2017 0503     Wt Readings from Last 3 Encounters:  08/13/19 102 lb (46.3 kg)  07/19/19 106 lb 14.8 oz (48.5 kg)  04/20/19 107 lb (48.5 kg)     Other studies Reviewed: Additional studies/ records that were reviewed today include: . Review of the above records demonstrates:   Assessment and Plan:   1. CAD without angina: She has baseline dyspnea and fatigue but she is not felt to be a good candidate for cardiac cath given advanced age. She has no chest pain. Continue ASA and beta blocker. .    2. Atrial flutter, paroxysmal: She is in sinus today on exam. We have elected not to start anti-coagulation given her advanced age. Will continue the beta blocker.   3. HTN: BP is well controlled.   4. Aortic insufficiency: Mild to moderate AI by echo January 2019. No change since 2016.  Will not plan to repeat echo.   5. Dyspnea: This has been persistent for years. Lungs are clear. No LE edema. Her aortic valve insufficiency is mild to moderate and likely not contributory.   6 Chronic diastolic CHF:  Weight is stable on Lasix 40 mg daily. Continue Lasix.   Current medicines are reviewed at length with the patient today.  The patient does not have concerns regarding medicines.  The following changes have been made:  no change  Labs/ tests ordered today include:  No orders of the defined types were placed in this encounter.   Disposition:   FU with me in 6  months  Signed, Lauree Chandler, MD 08/13/2019 10:04 AM    [facility] [address], [place], [place]  [address] Phone: [phone]; Fax: [phone]

## 2019-09-06 ENCOUNTER — Emergency Department: Payer: Medicare PPO

## 2019-09-06 ENCOUNTER — Other Ambulatory Visit: Payer: Self-pay

## 2019-09-06 ENCOUNTER — Inpatient Hospital Stay
Admission: EM | Admit: 2019-09-06 | Discharge: 2019-09-11 | DRG: 536 | Disposition: A | Discharge status: Skilled Nursing Facility | Payer: Medicare PPO | Attending: Internal Medicine | Admitting: Internal Medicine

## 2019-09-06 DIAGNOSIS — Z882 Allergy status to sulfonamides status: Secondary | ICD-10-CM | POA: Diagnosis not present

## 2019-09-06 DIAGNOSIS — D696 Thrombocytopenia, unspecified: Secondary | ICD-10-CM | POA: Diagnosis present

## 2019-09-06 DIAGNOSIS — Z96641 Presence of right artificial hip joint: Secondary | ICD-10-CM | POA: Diagnosis present

## 2019-09-06 DIAGNOSIS — Z974 Presence of external hearing-aid: Secondary | ICD-10-CM

## 2019-09-06 DIAGNOSIS — S32591A Other specified fracture of right pubis, initial encounter for closed fracture: Secondary | ICD-10-CM | POA: Diagnosis present

## 2019-09-06 DIAGNOSIS — W010XXA Fall on same level from slipping, tripping and stumbling without subsequent striking against object, initial encounter: Secondary | ICD-10-CM | POA: Diagnosis present

## 2019-09-06 DIAGNOSIS — Z933 Colostomy status: Secondary | ICD-10-CM | POA: Diagnosis not present

## 2019-09-06 DIAGNOSIS — I480 Paroxysmal atrial fibrillation: Secondary | ICD-10-CM | POA: Diagnosis present

## 2019-09-06 DIAGNOSIS — S32592A Other specified fracture of left pubis, initial encounter for closed fracture: Principal | ICD-10-CM

## 2019-09-06 DIAGNOSIS — F419 Anxiety disorder, unspecified: Secondary | ICD-10-CM | POA: Diagnosis present

## 2019-09-06 DIAGNOSIS — Z85048 Personal history of other malignant neoplasm of rectum, rectosigmoid junction, and anus: Secondary | ICD-10-CM

## 2019-09-06 DIAGNOSIS — C20 Malignant neoplasm of rectum: Secondary | ICD-10-CM | POA: Diagnosis present

## 2019-09-06 DIAGNOSIS — D649 Anemia, unspecified: Secondary | ICD-10-CM | POA: Diagnosis present

## 2019-09-06 DIAGNOSIS — S42202A Unspecified fracture of upper end of left humerus, initial encounter for closed fracture: Secondary | ICD-10-CM

## 2019-09-06 DIAGNOSIS — Z66 Do not resuscitate: Secondary | ICD-10-CM | POA: Diagnosis present

## 2019-09-06 DIAGNOSIS — I2510 Atherosclerotic heart disease of native coronary artery without angina pectoris: Secondary | ICD-10-CM | POA: Diagnosis present

## 2019-09-06 DIAGNOSIS — Z79899 Other long term (current) drug therapy: Secondary | ICD-10-CM | POA: Diagnosis not present

## 2019-09-06 DIAGNOSIS — S42212A Unspecified displaced fracture of surgical neck of left humerus, initial encounter for closed fracture: Secondary | ICD-10-CM | POA: Diagnosis not present

## 2019-09-06 DIAGNOSIS — Z841 Family history of disorders of kidney and ureter: Secondary | ICD-10-CM

## 2019-09-06 DIAGNOSIS — F418 Other specified anxiety disorders: Secondary | ICD-10-CM | POA: Diagnosis present

## 2019-09-06 DIAGNOSIS — Y9301 Activity, walking, marching and hiking: Secondary | ICD-10-CM | POA: Diagnosis present

## 2019-09-06 DIAGNOSIS — D72829 Elevated white blood cell count, unspecified: Secondary | ICD-10-CM | POA: Diagnosis present

## 2019-09-06 DIAGNOSIS — Z7982 Long term (current) use of aspirin: Secondary | ICD-10-CM

## 2019-09-06 DIAGNOSIS — E785 Hyperlipidemia, unspecified: Secondary | ICD-10-CM | POA: Diagnosis present

## 2019-09-06 DIAGNOSIS — Z880 Allergy status to penicillin: Secondary | ICD-10-CM | POA: Diagnosis not present

## 2019-09-06 DIAGNOSIS — K5900 Constipation, unspecified: Secondary | ICD-10-CM | POA: Diagnosis present

## 2019-09-06 DIAGNOSIS — Z9841 Cataract extraction status, right eye: Secondary | ICD-10-CM | POA: Diagnosis not present

## 2019-09-06 DIAGNOSIS — Z888 Allergy status to other drugs, medicaments and biological substances status: Secondary | ICD-10-CM

## 2019-09-06 DIAGNOSIS — Z20822 Contact with and (suspected) exposure to covid-19: Secondary | ICD-10-CM | POA: Diagnosis present

## 2019-09-06 DIAGNOSIS — Z961 Presence of intraocular lens: Secondary | ICD-10-CM | POA: Diagnosis present

## 2019-09-06 DIAGNOSIS — F329 Major depressive disorder, single episode, unspecified: Secondary | ICD-10-CM | POA: Diagnosis present

## 2019-09-06 DIAGNOSIS — W19XXXA Unspecified fall, initial encounter: Secondary | ICD-10-CM

## 2019-09-06 DIAGNOSIS — I10 Essential (primary) hypertension: Secondary | ICD-10-CM | POA: Diagnosis present

## 2019-09-06 DIAGNOSIS — Z955 Presence of coronary angioplasty implant and graft: Secondary | ICD-10-CM | POA: Diagnosis not present

## 2019-09-06 DIAGNOSIS — Z9842 Cataract extraction status, left eye: Secondary | ICD-10-CM | POA: Diagnosis not present

## 2019-09-06 DIAGNOSIS — Z8249 Family history of ischemic heart disease and other diseases of the circulatory system: Secondary | ICD-10-CM

## 2019-09-06 DIAGNOSIS — I252 Old myocardial infarction: Secondary | ICD-10-CM

## 2019-09-06 DIAGNOSIS — Z809 Family history of malignant neoplasm, unspecified: Secondary | ICD-10-CM

## 2019-09-06 LAB — COMPREHENSIVE METABOLIC PANEL
ALT: 17 U/L (ref 0–44)
AST: 28 U/L (ref 15–41)
Albumin: 3.6 g/dL (ref 3.5–5.0)
Alkaline Phosphatase: 60 U/L (ref 38–126)
Anion gap: 11 (ref 5–15)
BUN: 21 mg/dL (ref 8–23)
CO2: 24 mmol/L (ref 22–32)
Calcium: 9.1 mg/dL (ref 8.9–10.3)
Chloride: 103 mmol/L (ref 98–111)
Creatinine, Ser: 0.65 mg/dL (ref 0.44–1.00)
GFR calc Af Amer: >60 mL/min (ref >60)
GFR calc non Af Amer: >60 mL/min (ref >60)
Glucose, Bld: 110 mg/dL — ABNORMAL HIGH (ref 70–99)
Potassium: 4.1 mmol/L (ref 3.5–5.1)
Sodium: 138 mmol/L (ref 135–145)
Total Bilirubin: 0.9 mg/dL (ref 0.3–1.2)
Total Protein: 7.1 g/dL (ref 6.5–8.1)

## 2019-09-06 LAB — CBC
HCT: 37.6 % (ref 36.0–46.0)
Hemoglobin: 12.2 g/dL (ref 12.0–15.0)
MCH: 31.3 pg (ref 26.0–34.0)
MCHC: 32.4 g/dL (ref 30.0–36.0)
MCV: 96.4 fL (ref 80.0–100.0)
Platelets: 170 10*3/uL (ref 150–400)
RBC: 3.9 MIL/uL (ref 3.87–5.11)
RDW: 12.6 % (ref 11.5–15.5)
WBC: 20.8 10*3/uL — ABNORMAL HIGH (ref 4.0–10.5)
nRBC: 0 % (ref 0.0–0.2)

## 2019-09-06 LAB — SARS CORONAVIRUS 2 BY RT PCR (HOSPITAL ORDER, PERFORMED IN ~~LOC~~ HOSPITAL LAB): SARS Coronavirus 2: NEGATIVE

## 2019-09-06 MED ORDER — FUROSEMIDE 40 MG PO TABS
40.0000 mg | ORAL_TABLET | Freq: Every day | ORAL | Status: DC
  Administered 2019-09-08 – 2019-09-11 (×4): 40 mg via ORAL
  Filled 2019-09-06 (×5): qty 1

## 2019-09-06 MED ORDER — ONDANSETRON HCL 4 MG PO TABS
8.0000 mg | ORAL_TABLET | Freq: Three times a day (TID) | ORAL | Status: DC | PRN
  Administered 2019-09-08 – 2019-09-10 (×2): 8 mg via ORAL
  Filled 2019-09-06 (×2): qty 2

## 2019-09-06 MED ORDER — METOPROLOL SUCCINATE ER 25 MG PO TB24
25.0000 mg | ORAL_TABLET | Freq: Every day | ORAL | Status: DC
  Administered 2019-09-06: 25 mg via ORAL
  Filled 2019-09-06 (×2): qty 1

## 2019-09-06 MED ORDER — FENTANYL CITRATE (PF) 100 MCG/2ML IJ SOLN
50.0000 ug | Freq: Once | INTRAMUSCULAR | Status: AC
  Administered 2019-09-06: 50 ug via INTRAVENOUS
  Filled 2019-09-06: qty 2

## 2019-09-06 MED ORDER — MELATONIN 5 MG PO TABS
5.0000 mg | ORAL_TABLET | Freq: Every day | ORAL | Status: DC
  Filled 2019-09-06 (×3): qty 1

## 2019-09-06 MED ORDER — NITROGLYCERIN 0.4 MG SL SUBL: 0.4000 mg | SUBLINGUAL_TABLET | SUBLINGUAL | Status: DC | PRN

## 2019-09-06 MED ORDER — ASPIRIN EC 81 MG PO TBEC
81.0000 mg | DELAYED_RELEASE_TABLET | Freq: Every day | ORAL | Status: DC
  Administered 2019-09-07 – 2019-09-11 (×5): 81 mg via ORAL
  Filled 2019-09-06 (×5): qty 1

## 2019-09-06 MED ORDER — TRAMADOL HCL 50 MG PO TABS
50.0000 mg | ORAL_TABLET | Freq: Four times a day (QID) | ORAL | Status: DC
  Administered 2019-09-06 – 2019-09-09 (×10): 50 mg via ORAL
  Filled 2019-09-06 (×13): qty 1

## 2019-09-06 MED ORDER — POLYETHYLENE GLYCOL 3350 17 G PO PACK: 17.0000 g | PACK | Freq: Every day | ORAL | Status: DC | PRN

## 2019-09-06 MED ORDER — MORPHINE SULFATE (PF) 2 MG/ML IV SOLN
1.0000 mg | INTRAVENOUS | Status: DC | PRN
  Administered 2019-09-06 – 2019-09-07 (×2): 1 mg via INTRAVENOUS
  Filled 2019-09-06 (×4): qty 1

## 2019-09-06 MED ORDER — ENOXAPARIN SODIUM 40 MG/0.4ML ~~LOC~~ SOLN
40.0000 mg | SUBCUTANEOUS | Status: DC
  Administered 2019-09-07: 40 mg via SUBCUTANEOUS
  Filled 2019-09-06: qty 0.4

## 2019-09-06 MED ORDER — SODIUM CHLORIDE 0.45 % IV SOLN: INTRAVENOUS | Status: AC

## 2019-09-06 MED ORDER — MORPHINE SULFATE (PF) 2 MG/ML IV SOLN
1.0000 mg | INTRAVENOUS | Status: DC | PRN
  Administered 2019-09-06 (×2): 1 mg via INTRAVENOUS
  Filled 2019-09-06 (×2): qty 1

## 2019-09-06 NOTE — ED Provider Notes (Signed)
[facility] Emergency Department Provider Note  Time seen: 11:31 AM  I have reviewed the triage vital signs and the nursing notes.   HISTORY  Chief Complaint Fall, Hip Pain, and Shoulder Pain   HPI [name] is a 86 y.o. female with a past medical history of hypertension, hyperlipidemia, lives independently, presents to the emergency department after mechanical fall.  According to the patient she tripped landing on her left side.  Patient is complaining of pain in her left shoulder and left hip.  Patient did hit her head denies any headache does have a small laceration to left eyebrow.  Patient is awake alert, no distress, pain with attempted movement in the left shoulder and left hip.   Past Medical History:  Diagnosis Date  . Arthritis    FINGERS   . CAD (coronary artery disease) CARDIOLOGIST-- DR Angelena Form   a. s/p INF STEMI 7/12: tx with BMS to RCA;  b. cath 08/26/10: pLAD 30%, mLAD 50%, D1 40%, pCFX 95%, mRCA occluded;   c. staged PCI of pCFX with BMS;   d. echo 7/12:   EF 60-65%, mild RAE, mild to moderate AI, mild MR, moderate TR, RVE, PASP 47  . Complication of anesthesia PT STATES "MADE HER FEEL CRAZY"  . Degeneration of eye    left eye cornea  . Dyspnea   . First degree heart block   . Heart palpitations PAC'S AND SVT RUN'S  PER CARDIOLOGIST NOTE  . History of ST elevation myocardial infarction (STEMI) 08-26-2010-- INFERIOR WALL   S/P PCI  BMS IN RCA AND PROX. CX  . Hyperlipidemia   . Hypertension   . Impaired hearing BILATERAL HEARING AIDS  . Osteopenia   . PAF (paroxysmal atrial fibrillation) ([place])   . PONV (postoperative nausea and vomiting)   . Pulmonary nodules BENIGN  PER CT  10-12-2010  . Rectal Cancer 08/2010   adenocarcinoma   S/P PARTIAL PROCTECTOMY (NO CHEMO OR RADIATION)  . 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 S/P coronary artery stent  placement 08/2010   X2  BM    Patient Active Problem List   Diagnosis Date Noted  . Slurred speech 02/23/2017  . PAF (paroxysmal atrial fibrillation) ([place]) 02/23/2017  . Depression with anxiety 02/23/2017  . TIA (transient ischemic attack)   . Fracture, tibia and fibula 07/06/2015  . Tibia/fibula fracture 07/03/2015  . Lung nodule seen on imaging study 09/05/2013  . Exposure to TB 09/05/2013  . SOB (shortness of breath) 10/02/2011  . Vascular skin changes 07/17/2011  . 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 Anorexia post-op 12/08/2010  . HTN (hypertension) 11/03/2010  . Dizziness 09/15/2010  . CAD (coronary artery disease)   . Hyperlipidemia   . Rectal cancer, pT2uN0(pNX) s/p TEM partial proctectomy 09/07/2010    Past Surgical History:  Procedure Laterality Date  . ABDOMINAL HYSTERECTOMY  1950's   AND APPENDECTOMY  . CATARACT EXTRACTION W/ INTRAOCULAR LENS  IMPLANT, BILATERAL    . CORONARY ANGIOPLASTY WITH STENT PLACEMENT  08-26-2010  DR [place]   PCI, BM STENT IN RCA  . CORONARY ANGIOPLASTY WITH STENT PLACEMENT  08-29-2010  DR [place]   PCI, BM STENT IN PROXIMAL CIRCUMFLEX  . EXCISION BENIGN CYST RIGHT BREAST    . FISTULA PLUG N/A 06/21/2012   Procedure: insertion of FISTULA PLUG;  Surgeon: Leighton Ruff, MD;  Location: [facility];  Service: General;  Laterality: N/A;  . FLEXIBLE SIGMOIDOSCOPY N/A 04/02/2012   Procedure: FLEXIBLE SIGMOIDOSCOPY;  Surgeon: Leighton Ruff, MD;  Location: [facility];  Service: Endoscopy;  Laterality: N/A;  . KNEE SURGERY Right 1996  . LAPROSCOPY LYSIS ADHESIONS/ DRAINAGE OF PELVIC ABSCESS/ DIVERTING LOOP SIGMOID COLECTOMY  11-22-2010   POST OP RECTOVAGINAL FISTULA  . PARTIAL PROCTECTOMY BY TEM  11-17-2010   RECTAL CANCER  . RELEASE LEFT CARPAL TUNNEL/ OSTEOTOMY LEFT DISTAL RADIUS  11-10-2009  . STAPEDECTOMY  1970'S  . TONSILLECTOMY  CHILD  . TOTAL HIP ARTHROPLASTY Right 1992  . TRANSTHORACIC  ECHOCARDIOGRAM  10-10-2011   NORMAL LV SIZE WITH MILD FOCAL BASAL SEPTAL HYPERTROPHY/ EF 55-60%/ NORMAL RV SIZE AND LVSF/ BIATRIAL ENLARGEMENT/ MILD TO MODERATE AI  &  TR  . TYMPANOPLASTY Left 12-23-2009    Prior to Admission medications   Medication Sig Start Date End Date Taking? Authorizing Provider  acetaminophen (TYLENOL) 500 MG tablet Take 2 tablets (1,000 mg total) by mouth every 6 (six) hours as needed for mild pain. 07/06/15   Rama, Venetia Maxon, MD  aspirin EC 81 MG tablet Take 81 mg by mouth daily.    [provider]  beta carotene w/minerals (OCUVITE) tablet Take 1 tablet by mouth daily.    [provider]  BIOTIN PO Take 1 tablet by mouth daily.     [provider]  cholecalciferol (VITAMIN D) 1000 units tablet Take 1,000 Units by mouth daily.    [provider]  feeding supplement, ENSURE ENLIVE, (ENSURE ENLIVE) LIQD Take 237 mLs by mouth daily at 3 pm. 07/06/15   Rama, Venetia Maxon, MD  furosemide (LASIX) 40 MG tablet Take 40 mg by mouth daily.     [provider]  melatonin 5 MG TABS Take 5 mg by mouth at bedtime.    [provider]  metoprolol succinate (TOPROL XL) 25 MG 24 hr tablet Take 1 tablet (25 mg total) by mouth daily. 04/04/19   Burnell Blanks, MD  nitrofurantoin, macrocrystal-monohydrate, (MACROBID) 100 MG capsule Take 100 mg by mouth 2 (two) times daily. For 10 days. 07/18/19   [provider]  nitroGLYCERIN (NITROSTAT) 0.4 MG SL tablet Place 0.4 mg under the tongue every 5 (five) minutes as needed for chest pain.    [provider]  nystatin-triamcinolone (MYCOLOG II) cream Apply 1 application topically 2 (two) times daily as needed for rash.    [provider]  Polyethyl Glycol-Propyl Glycol (SYSTANE) 0.4-0.3 % GEL Place 1 drop into both eyes at bedtime.     [provider]  sertraline (ZOLOFT) 25 MG tablet Take 0.5 tablets by mouth daily. 04/02/19   [provider]   omeprazole (PRILOSEC) 40 MG capsule Take 40 mg by mouth as needed.  04/26/11  [provider]    Allergies  Allergen Reactions  . Effexor [Venlafaxine Hydrochloride] Other (See Comments)    Reaction not recalled by the patient  . Penicillins Shortness Of Breath    Has patient had a PCN reaction causing immediate rash, facial/tongue/throat swelling, SOB or lightheadedness with hypotension: Yes Has patient had a PCN reaction causing severe rash involving mucus membranes or skin necrosis: No Has patient had a PCN reaction that required hospitalization: No Has patient had a PCN reaction occurring within the last 10 years: No If all of the above answers are "NO", then may proceed with Cephalosporin use.   . Sulfa Antibiotics Nausea Only  . Dicyclomine Other (See Comments)  Caused confusion  . Lactose Intolerance (Gi) Diarrhea  . Hydrocodone Nausea Only    Family History  Problem Relation Age of Onset  . Kidney disease Mother   . Heart disease Father   . Cancer Brother     Social History Social History   Tobacco Use  . Smoking status: Never Smoker  . Smokeless tobacco: Never Used  Substance Use Topics  . Alcohol use: No  . Drug use: No    Review of Systems Constitutional: Negative for loss of consciousness Cardiovascular: Negative for chest pain. Respiratory: Negative for shortness of breath. Gastrointestinal: Negative for abdominal pain Musculoskeletal: Positive for moderate dull left hip and left shoulder pain much worse with attempted range of motion Skin: Small skin tear to left elbow, small laceration to left eyebrow Neurological: Negative for headache All other ROS negative  ____________________________________________   PHYSICAL EXAM:  VITAL SIGNS: ED Triage Vitals  Enc Vitals Group     BP 09/06/19 1105 (!) 103/49     Pulse Rate 09/06/19 1105 64     Resp 09/06/19 1105 20     Temp 09/06/19 1105 97.7 F (36.5 C)     Temp Source 09/06/19 1105  Oral     SpO2 09/06/19 1105 97 %     Weight 09/06/19 1111 99 lb (44.9 kg)     Height --      Head Circumference --      Peak Flow --      Pain Score 09/06/19 1110 10     Pain Loc --      Pain Edu? --      Excl. in [place]? --    Constitutional: Alert and oriented.  Overall well-appearing, no distress, pleasant. Eyes: Normal exam ENT      Head: Very small laceration above left eye/left eyebrow hemostatic, does not appear to require any repair.      Nose: No congestion/rhinnorhea.      Mouth/Throat: Mucous membranes are moist. Cardiovascular: Normal rate, regular rhythm.  Respiratory: Normal respiratory effort without tachypnea nor retractions. Breath sounds are clear Gastrointestinal: Soft and nontender. No distention. Musculoskeletal: Moderate tenderness palpation left hip with significant pain with any attempted range of motion.  1+ DP pulse neuro vastly intact distally.  Moderate swelling of the left shoulder neurovascular intact distally with significant tenderness to palpation or attempted range of motion Neurologic:  Normal speech and language. No gross focal neurologic deficits are appreciated. Skin:  Skin is warm, dry and intact.  Psychiatric: Mood and affect are normal. Speech and behavior are normal.   ____________________________________________   RADIOLOGY  CT head is negative for acute abnormality Chest x-ray is negative for acute abnormality Patient has left-sided superior and likely inferior pubic rami fractures. Patient has a left proximal humerus fracture  ____________________________________________   INITIAL IMPRESSION / ASSESSMENT AND PLAN / ED COURSE  Pertinent labs & imaging results that were available during my care of the patient were reviewed by me and considered in my medical decision making (see chart for details).   Patient presents emergency department after mechanical fall at home.  Patient at baseline is very high functioning for age lives  independently.  Unfortunately patient's exam is concerning for left hip fracture as well as left proximal humerus fracture.  We will obtain x-ray images as well as labs for likely preop purposes.  Patient will require admission to the hospitalist service.  Patient has fractures of her inferior and superior left-sided pubic rami as well as left  proximal humerus.  We will admit to the hospital service for further pain control physical therapy and eventually patient will require placement to a rehab facility.  Patient agreeable to plan of care.  DIAN LAPRADE was evaluated in Emergency Department on 09/06/2019 for the symptoms described in the history of present illness. She was evaluated in the context of the global COVID-19 pandemic, which necessitated consideration that the patient might be at risk for infection with the SARS-CoV-2 virus that causes COVID-19. Institutional protocols and algorithms that pertain to the evaluation of patients at risk for COVID-19 are in a state of rapid change based on information released by regulatory bodies including the CDC and federal and state organizations. These policies and algorithms were followed during the patient's care in the ED.  ____________________________________________   FINAL CLINICAL IMPRESSION(S) / ED DIAGNOSES  Fall Proximal humerus fracture Pubic rami fracture   Harvest Dark, MD 09/06/19 1341

## 2019-09-06 NOTE — ED Triage Notes (Signed)
Pt reports that she was walking with out her cane, fell to the left side, tried to catch herself, pt unsure if she hit her head, but has some blood noted to the left eye, pt c/o left shoulder pain and left hip pain

## 2019-09-06 NOTE — H&P (Signed)
History and Physical:    [name]   DPO:[id number] DOB: 29-May-1916 DOA: 09/06/2019  Referring MD/provider: Dr. Kerman Passey PCP: Joyce Downing, MD   Patient coming from: Home  Chief Complaint: Fall with hip and shoulder pain  History of Present Illness:   [name] is an 86 y.o. female who lives independently with PMH significant for HTN, atrial fibrillation/flutter on aspirin 81 mg daily, BPH/dizziness, gait instability who was in her usual state of reasonable health until earlier today when she stepped off a uneven step and fell.  She had immediate pain on her left shoulder left hip.  Patient was brought to ED for further evaluation.  Patient denies hitting her head, being confused.  Patient denies any chest pain or palpitation or shortness of breath.  She notes she was feeling entirely well until she fell earlier this morning.  She has not been unwell.  ED Course:  The patient was noted to be afebrile and normotensive.  X-rays revealed left superior and inferior pubic rami fractures as well as a fracture of left proximal humerus.  Patient is admitted for pain control and PT OT.  ROS:   ROS   Review of Systems: General: Denies fever, chills, malaise,  Eyes: Denies recent change in vision, no discharge, redness, pain noted Endocrine: Denies heat/cold intolerance, polyuria or weight loss. Respiratory: Denies cough, SOB at rest or hemoptysis Cardiovascular: Denies chest pain or palpitations GI: Denies nausea, vomiting, diarrhea or constipation GU: Denies dysuria, frequency or hematuria Blood/lymphatics: Denies easy bruising or bleeding Mood/affect: Denies anxiety/depression    Past Medical History:   Past Medical History:  Diagnosis Date  . Arthritis    FINGERS   . CAD (coronary artery disease) CARDIOLOGIST-- DR Angelena Form   a. s/p INF STEMI 7/12: tx with BMS to RCA;  b. cath 08/26/10: pLAD 30%, mLAD 50%, D1 40%, pCFX 95%, mRCA occluded;   c. staged PCI  of pCFX with BMS;   d. echo 7/12:   EF 60-65%, mild RAE, mild to moderate AI, mild MR, moderate TR, RVE, PASP 47  . Complication of anesthesia PT STATES "MADE HER FEEL CRAZY"  . Degeneration of eye    left eye cornea  . Dyspnea   . First degree heart block   . Heart palpitations PAC'S AND SVT RUN'S  PER CARDIOLOGIST NOTE  . History of ST elevation myocardial infarction (STEMI) 08-26-2010-- INFERIOR WALL   S/P PCI  BMS IN RCA AND PROX. CX  . Hyperlipidemia   . Hypertension   . Impaired hearing BILATERAL HEARING AIDS  . Osteopenia   . PAF (paroxysmal atrial fibrillation) ([place])   . PONV (postoperative nausea and vomiting)   . Pulmonary nodules BENIGN  PER CT  10-12-2010  . Rectal Cancer 08/2010   adenocarcinoma   S/P PARTIAL PROCTECTOMY (NO CHEMO OR RADIATION)  . 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 S/P coronary artery stent placement 08/2010   X2  BM    Past Surgical History:   Past Surgical History:  Procedure Laterality Date  . ABDOMINAL HYSTERECTOMY  1950's   AND APPENDECTOMY  . CATARACT EXTRACTION W/ INTRAOCULAR LENS  IMPLANT, BILATERAL    . CORONARY ANGIOPLASTY WITH STENT PLACEMENT  08-26-2010  DR [place]   PCI, BM STENT IN RCA  . CORONARY ANGIOPLASTY WITH STENT PLACEMENT  08-29-2010  DR [place]   PCI, BM STENT IN PROXIMAL CIRCUMFLEX  .  EXCISION BENIGN CYST RIGHT BREAST    . FISTULA PLUG N/A 06/21/2012   Procedure: insertion of FISTULA PLUG;  Surgeon: Leighton Ruff, MD;  Location: [facility];  Service: General;  Laterality: N/A;  . FLEXIBLE SIGMOIDOSCOPY N/A 04/02/2012   Procedure: FLEXIBLE SIGMOIDOSCOPY;  Surgeon: Leighton Ruff, MD;  Location: [facility];  Service: Endoscopy;  Laterality: N/A;  . KNEE SURGERY Right 1996  . LAPROSCOPY LYSIS ADHESIONS/ DRAINAGE OF PELVIC ABSCESS/ DIVERTING LOOP SIGMOID COLECTOMY  11-22-2010   POST OP RECTOVAGINAL FISTULA  . PARTIAL  PROCTECTOMY BY TEM  11-17-2010   RECTAL CANCER  . RELEASE LEFT CARPAL TUNNEL/ OSTEOTOMY LEFT DISTAL RADIUS  11-10-2009  . STAPEDECTOMY  1970'S  . TONSILLECTOMY  CHILD  . TOTAL HIP ARTHROPLASTY Right 1992  . TRANSTHORACIC ECHOCARDIOGRAM  10-10-2011   NORMAL LV SIZE WITH MILD FOCAL BASAL SEPTAL HYPERTROPHY/ EF 55-60%/ NORMAL RV SIZE AND LVSF/ BIATRIAL ENLARGEMENT/ MILD TO MODERATE AI  &  TR  . TYMPANOPLASTY Left 12-23-2009    Social History:   Social History   Socioeconomic History  . Marital status: Widowed    Spouse name: Not on file  . Number of children: Not on file  . Years of education: Not on file  . Highest education level: Not on file  Occupational History  . Not on file  Tobacco Use  . Smoking status: Never Smoker  . Smokeless tobacco: Never Used  Substance and Sexual Activity  . Alcohol use: No  . Drug use: No  . Sexual activity: Not on file  Other Topics Concern  . Not on file  Social History Narrative  . Not on file   Social Determinants of Health   Financial Resource Strain:   . Difficulty of Paying Living Expenses:   Food Insecurity:   . Worried About Charity fundraiser in the Last Year:   . Arboriculturist in the Last Year:   Transportation Needs:   . Film/video editor (Medical):   Marland Kitchen Lack of Transportation (Non-Medical):   Physical Activity:   . Days of Exercise per Week:   . Minutes of Exercise per Session:   Stress:   . Feeling of Stress :   Social Connections:   . Frequency of Communication with Friends and Family:   . Frequency of Social Gatherings with Friends and Family:   . Attends Religious Services:   . Active Member of Clubs or Organizations:   . Attends Archivist Meetings:   Marland Kitchen Marital Status:   Intimate Partner Violence:   . Fear of Current or Ex-Partner:   . Emotionally Abused:   Marland Kitchen Physically Abused:   . Sexually Abused:     Allergies   Effexor [venlafaxine hydrochloride], Penicillins, Sulfa antibiotics,  Dicyclomine, Lactose intolerance (gi), and Hydrocodone  Family history:   Family History  Problem Relation Age of Onset  . Kidney disease Mother   . Heart disease Father   . Cancer Brother     Current Medications:   Prior to Admission medications   Medication Sig Start Date End Date Taking? Authorizing Provider  acetaminophen (TYLENOL) 500 MG tablet Take 2 tablets (1,000 mg total) by mouth every 6 (six) hours as needed for mild pain. 07/06/15  Yes Rama, Venetia Maxon, MD  aspirin EC 81 MG tablet Take 81 mg by mouth daily.   Yes [provider]  beta carotene w/minerals (OCUVITE) tablet Take 1 tablet by mouth daily.   Yes [provider]  BIOTIN  PO Take 1 tablet by mouth daily.    Yes [provider]  cholecalciferol (VITAMIN D) 1000 units tablet Take 1,000 Units by mouth daily.   Yes [provider]  furosemide (LASIX) 40 MG tablet Take 40 mg by mouth daily.    Yes [provider]  melatonin 5 MG TABS Take 5 mg by mouth at bedtime.   Yes [provider]  metoprolol succinate (TOPROL XL) 25 MG 24 hr tablet Take 1 tablet (25 mg total) by mouth daily. 04/04/19  Yes Burnell Blanks, MD  nitroGLYCERIN (NITROSTAT) 0.4 MG SL tablet Place 0.4 mg under the tongue every 5 (five) minutes as needed for chest pain.   Yes [provider]  ondansetron (ZOFRAN) 8 MG tablet Take 8 mg by mouth every 8 (eight) hours as needed. 08/27/19  Yes [provider]  Polyethyl Glycol-Propyl Glycol (SYSTANE) 0.4-0.3 % GEL Place 1 drop into both eyes at bedtime.    Yes [provider]  omeprazole (PRILOSEC) 40 MG capsule Take 40 mg by mouth as needed.  04/26/11  [provider]    Physical Exam:   Vitals:   09/06/19 1105 09/06/19 1111 09/06/19 1300  BP: (!) 103/49    Pulse: 64  61  Resp: 20  18  Temp: 97.7 F (36.5 C)    TempSrc: Oral    SpO2: 97%  99%  Weight:  44.9 kg      Physical Exam: Blood pressure (!)  103/49, pulse 61, temperature 97.7 F (36.5 C), temperature source Oral, resp. rate 18, weight 44.9 kg, SpO2 99 %. Gen: Thin frail elderly female lying flat in bed with intermittent grimacing and complaining of pain with minimal movement of shoulder. Eyes: sclera anicteric, conjuctiva mildly injected bilaterally CVS: S1-S2, regulary, no gallops Respiratory:  decreased air entry likely secondary to decreased inspiratory effort GI: NABS, soft, NT, colostomy in place draining brown stool LE: Ecchymosis left side. Psych: patient is logical and coherent, judgement and insight appear normal, mood and affect appropriate to situation. Skin: no rashes or lesions or ulcers,    Data Review:    Labs: Basic Metabolic Panel: Recent Labs  Lab 09/06/19 1214  NA 138  K 4.1  CL 103  CO2 24  GLUCOSE 110*  BUN 21  CREATININE 0.65  CALCIUM 9.1   Liver Function Tests: Recent Labs  Lab 09/06/19 1214  AST 28  ALT 17  ALKPHOS 60  BILITOT 0.9  PROT 7.1  ALBUMIN 3.6   No results for input(s): LIPASE, AMYLASE in the last 168 hours. No results for input(s): AMMONIA in the last 168 hours. CBC: Recent Labs  Lab 09/06/19 1214  WBC 20.8*  HGB 12.2  HCT 37.6  MCV 96.4  PLT 170   Cardiac Enzymes: No results for input(s): CKTOTAL, CKMB, CKMBINDEX, TROPONINI in the last 168 hours.  BNP (last 3 results) No results for input(s): PROBNP in the last 8760 hours. CBG: No results for input(s): GLUCAP in the last 168 hours.  Urinalysis    Component Value Date/Time   COLORURINE YELLOW 07/19/2019 2034   APPEARANCEUR HAZY (A) 07/19/2019 2034   LABSPEC 1.016 07/19/2019 2034   PHURINE 5.0 07/19/2019 2034   GLUCOSEU NEGATIVE 07/19/2019 2034   HGBUR NEGATIVE 07/19/2019 2034   BILIRUBINUR NEGATIVE 07/19/2019 2034   KETONESUR NEGATIVE 07/19/2019 2034   PROTEINUR NEGATIVE 07/19/2019 2034   UROBILINOGEN 0.2 10/01/2014 1628   NITRITE NEGATIVE 07/19/2019 2034   LEUKOCYTESUR LARGE (A) 07/19/2019 2034  Radiographic Studies: DG Chest 1 View  Result Date: 09/06/2019 CLINICAL DATA:  Fall today.  Left shoulder pain. EXAM: CHEST  1 VIEW COMPARISON:  Radiograph 07/19/2019 and 04/20/2019. FINDINGS: 1136 hours. The heart size and mediastinal contours are stable. There is aortic atherosclerosis and prominent calcification of the mitral annulus. The lungs appear clear. There is no pleural effusion or pneumothorax. No acute osseous findings are seen. There are degenerative changes within the spine. IMPRESSION: No evidence of acute chest injury or active cardiopulmonary process. Electronically Signed   By: Richardean Sale M.D.   On: 09/06/2019 12:31   CT Head Wo Contrast  Result Date: 09/06/2019 CLINICAL DATA:  [place] while walking with her cane striking LEFT side, blood at LEFT eye, LEFT shoulder pain, LEFT hip pain EXAM: CT HEAD WITHOUT CONTRAST TECHNIQUE: Contiguous axial images were obtained from the base of the skull through the vertex without intravenous contrast. Sagittal and coronal MPR images reconstructed from axial data set. COMPARISON:  02/22/2017 FINDINGS: Brain: Generalized atrophy. Normal ventricular morphology. No midline shift or mass effect. Small vessel chronic ischemic changes of deep cerebral white matter. No intracranial hemorrhage, mass lesion, evidence of acute infarction, or extra-axial fluid collection. Vascular: Atherosclerotic calcification of internal carotid arteries bilaterally at skull base Skull: Demineralized but intact Sinuses/Orbits: Clear Other: N/A IMPRESSION: Atrophy with small vessel chronic ischemic changes of deep cerebral white matter. No acute intracranial abnormalities. Electronically Signed   By: Lavonia Dana M.D.   On: 09/06/2019 11:54   DG Shoulder Left  Result Date: 09/06/2019 CLINICAL DATA:  Left shoulder pain post fall. EXAM: LEFT SHOULDER - 2+ VIEW COMPARISON:  11/24/2013 FINDINGS: Minimally displaced fracture of the left humeral neck with extension to the  region of the greater tuberosity. No evidence of dislocation. Mild degenerate change of the [facility] joint and glenohumeral joints. IMPRESSION: Minimally displaced humeral neck fracture extending to the region of the greater tuberosity. Electronically Signed   By: Marin Olp M.D.   On: 09/06/2019 12:25   DG HIP UNILAT WITH PELVIS 2-3 VIEWS LEFT  Result Date: 09/06/2019 CLINICAL DATA:  Fall with left hip pain. EXAM: DG HIP (WITH OR WITHOUT PELVIS) 2-3V LEFT COMPARISON:  11/24/2013 FINDINGS: Exam demonstrates moderate diffuse osteopenia. There is a displaced acute fracture involving the left superior pubic ramus as well as separate fracture along the left side of the junction of the superior pubic ramus to the symphysis. Minimally displaced acute fracture of the left inferior pubic ramus. Possible subtle fracture along the right side of the symphysis. Right hip arthroplasty intact. Degenerative change of the spine. No evidence of left hip fracture. IMPRESSION: Acute displaced fractures involving the left superior pubic ramus as well as junction of the left superior pubic ramus to the symphysis. Fracture of the left inferior pubic ramus. Possible subtle nondisplaced fracture of the right side of the symphysis. Electronically Signed   By: Marin Olp M.D.   On: 09/06/2019 12:29    EKG: Ordered and pending.   Assessment/Plan:   Principal Problem:   Closed fracture of multiple pubic rami, left, initial encounter ([place]) Active Problems:   Rectal cancer, pT2uN0(pNX) s/p TEM partial proctectomy   CAD (coronary artery disease)   HTN (hypertension)   PAF (paroxysmal atrial fibrillation) (HCC)   Depression with anxiety   Bilateral pubic rami fracture We will start patient on morphine 1 mg every 3 hours as needed Patient notes she gets nausea to almost all pain medications including hydrocodone and oxycodone She does think she  has tolerated tramadol in the past We will place patient on tramadol 50 mg  every 6 with as needed Zofran Lovenox DVT prophylaxis to start tomorrow. PT OT can be consulted once pain is under better control I have a note into orthopedics but do not have a formal consultation in which can be requested if warranted  Proximal humerus fracture Left shoulder sling requested Pain management as noted above  Atrial fibrillation To new Toprol-XL for rate control Continue aspirin 81 mg daily  History of rectal cancer with colostomy Discussed possible constipation, patient states that she does intermittently get constipated As needed MiraLAX      Other information:   DVT prophylaxis: Enoxaparin ordered to start in the morning Code Status: DNR Family Communication: Patient's niece was at bedside throughout Disposition Plan: TBD Consults called: None yet, orthopedics is curb sided Admission status: Inpatient  [facility]  If 7PM-7AM, please contact night-coverage [email or web address] Password [facility] 09/06/2019, 3:59 PM

## 2019-09-07 DIAGNOSIS — S32592A Other specified fracture of left pubis, initial encounter for closed fracture: Secondary | ICD-10-CM | POA: Diagnosis not present

## 2019-09-07 LAB — BASIC METABOLIC PANEL
Anion gap: 9 (ref 5–15)
BUN: 26 mg/dL — ABNORMAL HIGH (ref 8–23)
CO2: 27 mmol/L (ref 22–32)
Calcium: 8.7 mg/dL — ABNORMAL LOW (ref 8.9–10.3)
Chloride: 101 mmol/L (ref 98–111)
Creatinine, Ser: 0.8 mg/dL (ref 0.44–1.00)
GFR calc Af Amer: >60 mL/min (ref >60)
GFR calc non Af Amer: >60 mL/min (ref >60)
Glucose, Bld: 135 mg/dL — ABNORMAL HIGH (ref 70–99)
Potassium: 4.5 mmol/L (ref 3.5–5.1)
Sodium: 137 mmol/L (ref 135–145)

## 2019-09-07 LAB — CBC
HCT: 29.6 % — ABNORMAL LOW (ref 36.0–46.0)
Hemoglobin: 10.1 g/dL — ABNORMAL LOW (ref 12.0–15.0)
MCH: 32.2 pg (ref 26.0–34.0)
MCHC: 34.1 g/dL (ref 30.0–36.0)
MCV: 94.3 fL (ref 80.0–100.0)
Platelets: 146 10*3/uL — ABNORMAL LOW (ref 150–400)
RBC: 3.14 MIL/uL — ABNORMAL LOW (ref 3.87–5.11)
RDW: 12.7 % (ref 11.5–15.5)
WBC: 10.2 10*3/uL (ref 4.0–10.5)
nRBC: 0 % (ref 0.0–0.2)

## 2019-09-07 MED ORDER — ENOXAPARIN SODIUM 30 MG/0.3ML ~~LOC~~ SOLN
30.0000 mg | SUBCUTANEOUS | Status: DC
  Administered 2019-09-08 – 2019-09-11 (×4): 30 mg via SUBCUTANEOUS
  Filled 2019-09-07 (×5): qty 0.3

## 2019-09-07 MED ORDER — METOPROLOL SUCCINATE ER 25 MG PO TB24
25.0000 mg | ORAL_TABLET | Freq: Every day | ORAL | Status: DC
  Administered 2019-09-07 – 2019-09-10 (×4): 25 mg via ORAL
  Filled 2019-09-07 (×4): qty 1

## 2019-09-07 MED ORDER — LIDOCAINE 5 % EX PTCH
1.0000 | MEDICATED_PATCH | CUTANEOUS | Status: DC
  Administered 2019-09-07 – 2019-09-11 (×5): 1 via TRANSDERMAL
  Filled 2019-09-07 (×6): qty 1

## 2019-09-07 NOTE — Progress Notes (Signed)
PROGRESS NOTE    [name]  YWV:[id number] DOB: Dec 21, 1916 DOA: 09/06/2019 PCP: Leonard Downing, MD    Brief Narrative:  [name] is an 86 y.o. female who lives independently with PMH significant for HTN, atrial fibrillation/flutter on aspirin 81 mg daily, BPH/dizziness, gait instability who was in her usual state of reasonable health until earlier today when she stepped off a uneven step and fell.  She had immediate pain on her left shoulder left hip.  Patient was brought to ED for further evaluation.  Patient denies hitting her head, being confused.  Patient denies any chest pain or palpitation or shortness of breath.  She notes she was feeling entirely well until she fell this morning.ED Course:  The patient was noted to be afebrile and normotensive.  X-rays revealed left superior and inferior pubic rami fractures as well as a fracture of left proximal humerus.      Consultants:   Ortho  Procedures:   Antimicrobials:       Subjective: Mild pain of the left shoulder and back otherwise has no complaints  Objective: Vitals:   09/06/19 2230 09/07/19 0231 09/07/19 0632 09/07/19 0810  BP: (!) 125/49 (!) 122/48 (!) 126/47 (!) 129/49  Pulse: 67 63 64 69  Resp: 16 16 17 14   Temp: 98 F (36.7 C) 98.2 F (36.8 C) 97.6 F (36.4 C) 98.7 F (37.1 C)  TempSrc: Oral Oral Oral Oral  SpO2: 100% 100% 100% 100%  Weight:        Intake/Output Summary (Last 24 hours) at 09/07/2019 1346 Last data filed at 09/07/2019 8546 Gross per 24 hour  Intake --  Output 200 ml  Net -200 ml   Filed Weights   09/06/19 1111  Weight: 44.9 kg    Examination:  General exam: Appears calm and comfortable  Respiratory system: Clear to auscultation. Respiratory effort normal. Cardiovascular system: S1 & S2 heard, RRR. No JVD, murmurs, rubs, gallops or clicks. Gastrointestinal system: Abdomen is nondistended, soft and nontender.  Normal bowel sounds heard. Central nervous system:  Alert and oriented.  Grossly intact Extremities: No edema left shoulder in a sling Skin warm and dry Psychiatry: Judgement and insight appear normal. Mood & affect appropriate.     Data Reviewed: I have personally reviewed following labs and imaging studies  CBC: Recent Labs  Lab 09/06/19 1214 09/07/19 0509  WBC 20.8* 10.2  HGB 12.2 10.1*  HCT 37.6 29.6*  MCV 96.4 94.3  PLT 170 270*   Basic Metabolic Panel: Recent Labs  Lab 09/06/19 1214 09/07/19 0509  NA 138 137  K 4.1 4.5  CL 103 101  CO2 24 27  GLUCOSE 110* 135*  BUN 21 26*  CREATININE 0.65 0.80  CALCIUM 9.1 8.7*   GFR: Estimated Creatinine Clearance: 25.8 mL/min (by C-G formula based on SCr of 0.8 mg/dL). Liver Function Tests: Recent Labs  Lab 09/06/19 1214  AST 28  ALT 17  ALKPHOS 60  BILITOT 0.9  PROT 7.1  ALBUMIN 3.6   No results for input(s): LIPASE, AMYLASE in the last 168 hours. No results for input(s): AMMONIA in the last 168 hours. Coagulation Profile: No results for input(s): INR, PROTIME in the last 168 hours. Cardiac Enzymes: No results for input(s): CKTOTAL, CKMB, CKMBINDEX, TROPONINI in the last 168 hours. BNP (last 3 results) No results for input(s): PROBNP in the last 8760 hours. HbA1C: No results for input(s): HGBA1C in the last 72 hours. CBG: No results for input(s): GLUCAP in the  last 168 hours. Lipid Profile: No results for input(s): CHOL, HDL, LDLCALC, TRIG, CHOLHDL, LDLDIRECT in the last 72 hours. Thyroid Function Tests: No results for input(s): TSH, T4TOTAL, FREET4, T3FREE, THYROIDAB in the last 72 hours. Anemia Panel: No results for input(s): VITAMINB12, FOLATE, FERRITIN, TIBC, IRON, RETICCTPCT in the last 72 hours. Sepsis Labs: No results for input(s): PROCALCITON, LATICACIDVEN in the last 168 hours.  Recent Results (from the past 240 hour(s))  SARS Coronavirus 2 by RT PCR (hospital order, performed in [facility] hospital lab) Nasopharyngeal Nasopharyngeal Swab      Status: None   Collection Time: 09/06/19 12:14 PM   Specimen: Nasopharyngeal Swab  Result Value Ref Range Status   SARS Coronavirus 2 NEGATIVE NEGATIVE Final    Comment: (NOTE) SARS-CoV-2 target nucleic acids are NOT DETECTED.  The SARS-CoV-2 RNA is generally detectable in upper and lower respiratory specimens during the acute phase of infection. The lowest concentration of SARS-CoV-2 viral copies this assay can detect is 250 copies / mL. A negative result does not preclude SARS-CoV-2 infection and should not be used as the sole basis for treatment or other patient management decisions.  A negative result may occur with improper specimen collection / handling, submission of specimen other than nasopharyngeal swab, presence of viral mutation(s) within the areas targeted by this assay, and inadequate number of viral copies (<250 copies / mL). A negative result must be combined with clinical observations, patient history, and epidemiological information.  Fact Sheet for Patients:   StrictlyIdeas.no  Fact Sheet for Healthcare Providers: BankingDealers.co.za  This test is not yet approved or  cleared by the [place] FDA and has been authorized for detection and/or diagnosis of SARS-CoV-2 by FDA under an Emergency Use Authorization (EUA).  This EUA will remain in effect (meaning this test can be used) for the duration of the COVID-19 declaration under Section 564(b)(1) of the Act, 21 U.S.C. section 360bbb-3(b)(1), unless the authorization is terminated or revoked sooner.  Performed at [facility], [address], [place], [place] [address]          Radiology Studies: DG Chest 1 View  Result Date: 09/06/2019 CLINICAL DATA:  Fall today.  Left shoulder pain. EXAM: CHEST  1 VIEW COMPARISON:  Radiograph 07/19/2019 and 04/20/2019. FINDINGS: 1136 hours. The heart size and mediastinal contours are stable. There is aortic  atherosclerosis and prominent calcification of the mitral annulus. The lungs appear clear. There is no pleural effusion or pneumothorax. No acute osseous findings are seen. There are degenerative changes within the spine. IMPRESSION: No evidence of acute chest injury or active cardiopulmonary process. Electronically Signed   By: Richardean Sale M.D.   On: 09/06/2019 12:31   CT Head Wo Contrast  Result Date: 09/06/2019 CLINICAL DATA:  [place] while walking with her cane striking LEFT side, blood at LEFT eye, LEFT shoulder pain, LEFT hip pain EXAM: CT HEAD WITHOUT CONTRAST TECHNIQUE: Contiguous axial images were obtained from the base of the skull through the vertex without intravenous contrast. Sagittal and coronal MPR images reconstructed from axial data set. COMPARISON:  02/22/2017 FINDINGS: Brain: Generalized atrophy. Normal ventricular morphology. No midline shift or mass effect. Small vessel chronic ischemic changes of deep cerebral white matter. No intracranial hemorrhage, mass lesion, evidence of acute infarction, or extra-axial fluid collection. Vascular: Atherosclerotic calcification of internal carotid arteries bilaterally at skull base Skull: Demineralized but intact Sinuses/Orbits: Clear Other: N/A IMPRESSION: Atrophy with small vessel chronic ischemic changes of deep cerebral white matter. No acute intracranial  abnormalities. Electronically Signed   By: Lavonia Dana M.D.   On: 09/06/2019 11:54   DG Shoulder Left  Result Date: 09/06/2019 CLINICAL DATA:  Left shoulder pain post fall. EXAM: LEFT SHOULDER - 2+ VIEW COMPARISON:  11/24/2013 FINDINGS: Minimally displaced fracture of the left humeral neck with extension to the region of the greater tuberosity. No evidence of dislocation. Mild degenerate change of the [facility] joint and glenohumeral joints. IMPRESSION: Minimally displaced humeral neck fracture extending to the region of the greater tuberosity. Electronically Signed   By: Marin Olp M.D.   On:  09/06/2019 12:25   DG HIP UNILAT WITH PELVIS 2-3 VIEWS LEFT  Result Date: 09/06/2019 CLINICAL DATA:  Fall with left hip pain. EXAM: DG HIP (WITH OR WITHOUT PELVIS) 2-3V LEFT COMPARISON:  11/24/2013 FINDINGS: Exam demonstrates moderate diffuse osteopenia. There is a displaced acute fracture involving the left superior pubic ramus as well as separate fracture along the left side of the junction of the superior pubic ramus to the symphysis. Minimally displaced acute fracture of the left inferior pubic ramus. Possible subtle fracture along the right side of the symphysis. Right hip arthroplasty intact. Degenerative change of the spine. No evidence of left hip fracture. IMPRESSION: Acute displaced fractures involving the left superior pubic ramus as well as junction of the left superior pubic ramus to the symphysis. Fracture of the left inferior pubic ramus. Possible subtle nondisplaced fracture of the right side of the symphysis. Electronically Signed   By: Marin Olp M.D.   On: 09/06/2019 12:29        Scheduled Meds: . aspirin EC  81 mg Oral Daily  . enoxaparin (LOVENOX) injection  40 mg Subcutaneous Q24H  . furosemide  40 mg Oral Daily  . lidocaine  1 patch Transdermal Q24H  . melatonin  5 mg Oral QHS  . metoprolol succinate  25 mg Oral Q2000  . traMADol  50 mg Oral Q6H   Continuous Infusions: . sodium chloride 100 mL/hr at 09/07/19 0403    Assessment & Plan:   Principal Problem:   Closed fracture of multiple pubic rami, left, initial encounter [facility]) Active Problems:   Rectal cancer, pT2uN0(pNX) s/p TEM partial proctectomy   CAD (coronary artery disease)   HTN (hypertension)   PAF (paroxysmal atrial fibrillation) (HCC)   Depression with anxiety   Bilateral pubic rami fracture Pain controlled Add lidocaine patch Patient notes she gets nausea to almost all pain medications including hydrocodone and oxycodone Continue tramadol 50 mg every 6 with as needed Zofran Ortho is input  appreciated-patient may weight-bear as tolerated on the left lower extremity.  Continue sling for left upper extremity PT OT Is a hemiwalker or cane to avoid weightbearing on the left shoulder No surgical intervention at this time   Proximal humerus fracture Left shoulder sling requested Pain management as noted above Lidocaine patch  Atrial fibrillation Continue Toprol-XL for rate control Continue aspirin 81 mg daily  History of rectal cancer with colostomy Continue MiraLAX as needed     DVT prophylaxis: Lovenox Code Status: DNR Family Communication: None at bedside Disposition Plan: Home versus rehab Status is: Inpatient  Remains inpatient appropriate because:Unsafe d/c plan   Dispo: The patient is from: Home              Anticipated d/c is to: Home versus SNF needs evaluation              Anticipated d/c date is: 1 day  Patient currently is not medically stable to d/c.            LOS: 1 day   Time spent: 35 minutes with more than 50% COC    Nolberto Hanlon, MD [facility] Pager [phone]  If 7PM-7AM, please contact night-coverage [email or web address] Password [email or web address] 09/07/2019, 1:46 PM

## 2019-09-07 NOTE — Plan of Care (Signed)

## 2019-09-07 NOTE — Consult Note (Addendum)
Full consult note to follow.  I have seen and examined the patient.  Patient was seen with a family member at the bedside.  Patient has a minimally displaced fracture of the left proximal humerus.  She also has mildly displaced superior and inferior pubic rami fractures.  Patient may weight-bear as tolerated on the left lower extremity.  Continue sling use for her left upper extremity.  Patient should be evaluated by physical therapy.  Ideally the patient would benefit from use of a hemiwalker or cane to avoid weightbearing on the left shoulder.  However if the patient is unstable she will need to use a regular walker and placed some weight through her left shoulder despite the fracture.  The patient and family understands that if she requires weightbearing through the left upper extremity for balance, there is some risk for fracture displacement.  No surgical intervention is required for either of these fractures at this time.

## 2019-09-07 NOTE — TOC Initial Note (Addendum)
Transition of Care [facility]) - Initial/Assessment Note    Patient Details  Name: [name] MRN: [id number] Date of Birth: December 02, 1916  [facility]) CM/SW Contact:    Anselm Pancoast, RN Phone Number: 09/07/2019, 2:59 PM  Clinical Narrative:                 Patient resting in bed with eyes closed upon entering room. Unable to assess.   RN CM attempted to outreach to niece, POA, [facility] Headings O-LVMM requesting callback.   Spoke to [place] who confirmed patient is agreeable to SNF and would prefer [facility] and if they are not available would prefer [facility] in [place]. Patient was independent and lives alone prior to fall. 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   Expected Discharge Plan: [place] Barriers to Discharge: Continued Medical Work up   Patient Goals and CMS Choice        Expected Discharge Plan and Services Expected Discharge Plan: [place] arrangements for the past 2 months: Single Family Home                                      Prior Living Arrangements/Services Living arrangements for the past 2 months: Single Family Home Lives with:: Self Patient language and need for interpreter reviewed:: Yes Do you feel safe going back to the place where you live?: Yes      Need for Family Participation in Patient Care: Yes (Comment) Care giver support system in place?: Yes (comment)   Criminal Activity/Legal Involvement Pertinent to Current Situation/Hospitalization: No - Comment as needed  Activities of Daily Living      Permission Sought/Granted Permission sought to share information with : Facility Art therapist granted to share information with : Yes, Verbal Permission Granted  Share Information with NAME: Potential SNF facility           Emotional Assessment Appearance:: Appears stated age Attitude/Demeanor/Rapport: Unable to  Assess Affect (typically observed): Unable to Assess Orientation: : Oriented to Self, Oriented to Place, Oriented to  Time, Oriented to Situation Alcohol / Substance Use: Not Applicable Psych Involvement: No (comment)  Admission diagnosis:  Fall [W19.XXXA] Closed fracture of multiple pubic rami, left, initial encounter ([place]) [S32.592A] Closed fracture of proximal end of left humerus, unspecified fracture morphology, initial encounter [S42.202A] Closed fracture of multiple rami of left pubis, initial encounter [facility]) [S32.592A] Patient Active Problem List   Diagnosis Date Noted  . Closed fracture of multiple pubic rami, left, initial encounter ([place]) 09/06/2019  . Slurred speech 02/23/2017  . PAF (paroxysmal atrial fibrillation) ([place]) 02/23/2017  . Depression with anxiety 02/23/2017  . TIA (transient ischemic attack)   . Fracture, tibia and fibula 07/06/2015  . Tibia/fibula fracture 07/03/2015  . Lung nodule seen on imaging study 09/05/2013  . Exposure to TB 09/05/2013  . SOB (shortness of breath) 10/02/2011  . Vascular skin changes 07/17/2011  . 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 Anorexia post-op 12/08/2010  . HTN (hypertension) 11/03/2010  . Dizziness 09/15/2010  . CAD (coronary artery disease)   . Hyperlipidemia   . Rectal cancer, pT2uN0(pNX) s/p TEM partial proctectomy 09/07/2010   PCP:  Leonard Downing, MD Pharmacy:   [facility] ([facility]), [place] [address]  [place] ([place]) [place] [address] Phone: [phone] Fax: [phone]     Social Determinants of Health (SDOH) Interventions    Readmission Risk Interventions No flowsheet data found.

## 2019-09-07 NOTE — Progress Notes (Signed)
Anticoagulation monitoring(Lovenox):  86 yo female ordered Lovenox 40 mg Q24h  Filed Weights   09/06/19 1111  Weight: 44.9 kg (99 lb)   Body mass index is 17.54 kg/m.   Lab Results  Component Value Date   CREATININE 0.80 09/07/2019   CREATININE 0.65 09/06/2019   CREATININE 1.10 (H) 07/19/2019   Estimated Creatinine Clearance: 25.8 mL/min (by C-G formula based on SCr of 0.8 mg/dL). Hemoglobin & Hematocrit     Component Value Date/Time   HGB 10.1 (L) 09/07/2019 0509   HCT 29.6 (L) 09/07/2019 0638     Per Protocol for Patient with estCrcl < 30 ml/min and wt<44.9 kg, will transition to Lovenox 30 mg Q24h.

## 2019-09-07 NOTE — Consult Note (Signed)
ORTHOPAEDIC CONSULTATION  REQUESTING PHYSICIAN: Nolberto Hanlon, MD  Chief Complaint: Left shoulder and hip/pelvis pain status post fall  HPI: [name] is a 86 y.o. female who complains of left shoulder and hip/pelvis pain status post fall at home yesterday.  Patient explains that she was walking without her cane and lost her balance falling onto her left side.  She was brought to the [facility] emergency department where x-rays revealed a left proximal humerus fracture without significant displacement as well as mildly displaced superior and inferior pubic rami fractures.  Patient denies any numbness or tingling.  Past Medical History:  Diagnosis Date  . Arthritis    FINGERS   . CAD (coronary artery disease) CARDIOLOGIST-- DR Angelena Form   a. s/p INF STEMI 7/12: tx with BMS to RCA;  b. cath 08/26/10: pLAD 30%, mLAD 50%, D1 40%, pCFX 95%, mRCA occluded;   c. staged PCI of pCFX with BMS;   d. echo 7/12:   EF 60-65%, mild RAE, mild to moderate AI, mild MR, moderate TR, RVE, PASP 47  . Complication of anesthesia PT STATES "MADE HER FEEL CRAZY"  . Degeneration of eye    left eye cornea  . Dyspnea   . First degree heart block   . Heart palpitations PAC'S AND SVT RUN'S  PER CARDIOLOGIST NOTE  . History of ST elevation myocardial infarction (STEMI) 08-26-2010-- INFERIOR WALL   S/P PCI  BMS IN RCA AND PROX. CX  . Hyperlipidemia   . Hypertension   . Impaired hearing BILATERAL HEARING AIDS  . Osteopenia   . PAF (paroxysmal atrial fibrillation) ([place])   . PONV (postoperative nausea and vomiting)   . Pulmonary nodules BENIGN  PER CT  10-12-2010  . Rectal Cancer 08/2010   adenocarcinoma   S/P PARTIAL PROCTECTOMY (NO CHEMO OR RADIATION)  . 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 S/P coronary artery stent placement 08/2010   X2  BM   Past Surgical History:  Procedure Laterality Date  . ABDOMINAL  HYSTERECTOMY  1950's   AND APPENDECTOMY  . CATARACT EXTRACTION W/ INTRAOCULAR LENS  IMPLANT, BILATERAL    . CORONARY ANGIOPLASTY WITH STENT PLACEMENT  08-26-2010  DR Speers   PCI, BM STENT IN RCA  . CORONARY ANGIOPLASTY WITH STENT PLACEMENT  08-29-2010  DR [place]   PCI, BM STENT IN PROXIMAL CIRCUMFLEX  . EXCISION BENIGN CYST RIGHT BREAST    . FISTULA PLUG N/A 06/21/2012   Procedure: insertion of FISTULA PLUG;  Surgeon: Leighton Ruff, MD;  Location: [facility];  Service: General;  Laterality: N/A;  . FLEXIBLE SIGMOIDOSCOPY N/A 04/02/2012   Procedure: FLEXIBLE SIGMOIDOSCOPY;  Surgeon: Leighton Ruff, MD;  Location: [facility];  Service: Endoscopy;  Laterality: N/A;  . KNEE SURGERY Right 1996  . LAPROSCOPY LYSIS ADHESIONS/ DRAINAGE OF PELVIC ABSCESS/ DIVERTING LOOP SIGMOID COLECTOMY  11-22-2010   POST OP RECTOVAGINAL FISTULA  . PARTIAL PROCTECTOMY BY TEM  11-17-2010   RECTAL CANCER  . RELEASE LEFT CARPAL TUNNEL/ OSTEOTOMY LEFT DISTAL RADIUS  11-10-2009  . STAPEDECTOMY  1970'S  . TONSILLECTOMY  CHILD  . TOTAL HIP ARTHROPLASTY Right 1992  . TRANSTHORACIC ECHOCARDIOGRAM  10-10-2011   NORMAL LV SIZE WITH MILD FOCAL BASAL SEPTAL HYPERTROPHY/ EF 55-60%/ NORMAL RV SIZE AND LVSF/ BIATRIAL ENLARGEMENT/ MILD TO MODERATE AI  &  TR  . TYMPANOPLASTY Left 12-23-2009   Social History   Socioeconomic History  . Marital status:  Widowed    Spouse name: Not on file  . Number of children: Not on file  . Years of education: Not on file  . Highest education level: Not on file  Occupational History  . Not on file  Tobacco Use  . Smoking status: Never Smoker  . Smokeless tobacco: Never Used  Substance and Sexual Activity  . Alcohol use: No  . Drug use: No  . Sexual activity: Not on file  Other Topics Concern  . Not on file  Social History Narrative  . Not on file   Social Determinants of Health   Financial Resource Strain:   . Difficulty of Paying Living Expenses:   Food  Insecurity:   . Worried About Charity fundraiser in the Last Year:   . Arboriculturist in the Last Year:   Transportation Needs:   . Film/video editor (Medical):   Marland Kitchen Lack of Transportation (Non-Medical):   Physical Activity:   . Days of Exercise per Week:   . Minutes of Exercise per Session:   Stress:   . Feeling of Stress :   Social Connections:   . Frequency of Communication with Friends and Family:   . Frequency of Social Gatherings with Friends and Family:   . Attends Religious Services:   . Active Member of Clubs or Organizations:   . Attends Archivist Meetings:   Marland Kitchen Marital Status:    Family History  Problem Relation Age of Onset  . Kidney disease Mother   . Heart disease Father   . Cancer Brother    Allergies  Allergen Reactions  . Effexor [Venlafaxine Hydrochloride] Other (See Comments)    Reaction not recalled by the patient  . Penicillins Shortness Of Breath    Has patient had a PCN reaction causing immediate rash, facial/tongue/throat swelling, SOB or lightheadedness with hypotension: Yes Has patient had a PCN reaction causing severe rash involving mucus membranes or skin necrosis: No Has patient had a PCN reaction that required hospitalization: No Has patient had a PCN reaction occurring within the last 10 years: No If all of the above answers are "NO", then may proceed with Cephalosporin use.   . Sulfa Antibiotics Nausea Only  . Dicyclomine Other (See Comments)    Caused confusion  . Lactose Intolerance (Gi) Diarrhea  . Hydrocodone Nausea Only   Prior to Admission medications   Medication Sig Start Date End Date Taking? Authorizing Provider  acetaminophen (TYLENOL) 500 MG tablet Take 2 tablets (1,000 mg total) by mouth every 6 (six) hours as needed for mild pain. 07/06/15  Yes Rama, Venetia Maxon, MD  aspirin EC 81 MG tablet Take 81 mg by mouth daily.   Yes [provider]  beta carotene w/minerals (OCUVITE) tablet Take 1 tablet by  mouth daily.   Yes [provider]  BIOTIN PO Take 1 tablet by mouth daily.    Yes [provider]  cholecalciferol (VITAMIN D) 1000 units tablet Take 1,000 Units by mouth daily.   Yes [provider]  furosemide (LASIX) 40 MG tablet Take 40 mg by mouth daily.    Yes [provider]  melatonin 5 MG TABS Take 5 mg by mouth at bedtime.   Yes [provider]  metoprolol succinate (TOPROL XL) 25 MG 24 hr tablet Take 1 tablet (25 mg total) by mouth daily. 04/04/19  Yes Burnell Blanks, MD  nitroGLYCERIN (NITROSTAT) 0.4 MG SL tablet Place 0.4 mg under the tongue every  5 (five) minutes as needed for chest pain.   Yes [provider]  ondansetron (ZOFRAN) 8 MG tablet Take 8 mg by mouth every 8 (eight) hours as needed. 08/27/19  Yes [provider]  Polyethyl Glycol-Propyl Glycol (SYSTANE) 0.4-0.3 % GEL Place 1 drop into both eyes at bedtime.    Yes [provider]  omeprazole (PRILOSEC) 40 MG capsule Take 40 mg by mouth as needed.  04/26/11  [provider]   DG Chest 1 View  Result Date: 09/06/2019 CLINICAL DATA:  Fall today.  Left shoulder pain. EXAM: CHEST  1 VIEW COMPARISON:  Radiograph 07/19/2019 and 04/20/2019. FINDINGS: 1136 hours. The heart size and mediastinal contours are stable. There is aortic atherosclerosis and prominent calcification of the mitral annulus. The lungs appear clear. There is no pleural effusion or pneumothorax. No acute osseous findings are seen. There are degenerative changes within the spine. IMPRESSION: No evidence of acute chest injury or active cardiopulmonary process. Electronically Signed   By: Richardean Sale M.D.   On: 09/06/2019 12:31   CT Head Wo Contrast  Result Date: 09/06/2019 CLINICAL DATA:  [place] while walking with her cane striking LEFT side, blood at LEFT eye, LEFT shoulder pain, LEFT hip pain EXAM: CT HEAD WITHOUT CONTRAST TECHNIQUE: Contiguous axial images were obtained from  the base of the skull through the vertex without intravenous contrast. Sagittal and coronal MPR images reconstructed from axial data set. COMPARISON:  02/22/2017 FINDINGS: Brain: Generalized atrophy. Normal ventricular morphology. No midline shift or mass effect. Small vessel chronic ischemic changes of deep cerebral white matter. No intracranial hemorrhage, mass lesion, evidence of acute infarction, or extra-axial fluid collection. Vascular: Atherosclerotic calcification of internal carotid arteries bilaterally at skull base Skull: Demineralized but intact Sinuses/Orbits: Clear Other: N/A IMPRESSION: Atrophy with small vessel chronic ischemic changes of deep cerebral white matter. No acute intracranial abnormalities. Electronically Signed   By: Lavonia Dana M.D.   On: 09/06/2019 11:54   DG Shoulder Left  Result Date: 09/06/2019 CLINICAL DATA:  Left shoulder pain post fall. EXAM: LEFT SHOULDER - 2+ VIEW COMPARISON:  11/24/2013 FINDINGS: Minimally displaced fracture of the left humeral neck with extension to the region of the greater tuberosity. No evidence of dislocation. Mild degenerate change of the [facility] joint and glenohumeral joints. IMPRESSION: Minimally displaced humeral neck fracture extending to the region of the greater tuberosity. Electronically Signed   By: Marin Olp M.D.   On: 09/06/2019 12:25   DG HIP UNILAT WITH PELVIS 2-3 VIEWS LEFT  Result Date: 09/06/2019 CLINICAL DATA:  Fall with left hip pain. EXAM: DG HIP (WITH OR WITHOUT PELVIS) 2-3V LEFT COMPARISON:  11/24/2013 FINDINGS: Exam demonstrates moderate diffuse osteopenia. There is a displaced acute fracture involving the left superior pubic ramus as well as separate fracture along the left side of the junction of the superior pubic ramus to the symphysis. Minimally displaced acute fracture of the left inferior pubic ramus. Possible subtle fracture along the right side of the symphysis. Right hip arthroplasty intact. Degenerative change of  the spine. No evidence of left hip fracture. IMPRESSION: Acute displaced fractures involving the left superior pubic ramus as well as junction of the left superior pubic ramus to the symphysis. Fracture of the left inferior pubic ramus. Possible subtle nondisplaced fracture of the right side of the symphysis. Electronically Signed   By: Marin Olp M.D.   On: 09/06/2019 12:29    Positive ROS: All other systems have been reviewed and were otherwise negative with  the exception of those mentioned in the HPI and as above.  Physical Exam: General: Alert, no acute distress  MUSCULOSKELETAL:  Left upper extremity: Patient skin is intact.  Patient has a Lidoderm patch on her anterior left shoulder.  Patient has mild tenderness to palpation of the anterior shoulder.  She has pain with any movement of the left shoulder.  Her arm compartments are soft and compressible.  There is no erythema ecchymosis or significant swelling.  Distally she is neurovascular intact he can move all 5 digits of the left hand.  Her fingers are well-perfused and she has a palpable radial pulse.  Patient demonstrates active motion to the wrist.  She is wearing a sling on her left upper extremity.  Left lower extremity: Patient skin is intact.  Patient has mild pain with lower extremity rotation.  Distally she has palpable pedal pulses, intact sensation light touch and intact motor function.  Assessment: Left proximal humerus fracture Left superior and inferior pubic rami fractures  Plan: I have seen and examined the patient.  Patient was seen with a family member at the bedside.  Patient has a minimally displaced fracture of the left proximal humerus.  She also has mildly displaced superior and inferior pubic rami fractures.    Patient should be evaluated by physical therapy.   No surgical intervention is required for either of these fractures at this time.    Patient may weight-bear as tolerated on the left lower extremity.   Ideally the patient would benefit from use of a hemiwalker or cane to avoid weightbearing on the left shoulder.  However, if the patient requires use of her left upper extremity for stability, she will need to use a regular walker and placed some weight through her left shoulder despite the fracture.  The patient and family understands that if she requires weightbearing through the left upper extremity for balance, there is some risk for fracture displacement.   Patient should continue use of a sling for her left upper extremity.  She may remove the sling to use a walker if necessary for stability.  Patient will likely need a skilled nursing facility upon discharge given her upper and lower extremity fractures.  Patient may follow-up in my office in approximately 2 weeks after discharge for reevaluation and x-ray.   Thornton Park, MD    09/07/2019 1:52 PM

## 2019-09-08 DIAGNOSIS — S32592A Other specified fracture of left pubis, initial encounter for closed fracture: Secondary | ICD-10-CM | POA: Diagnosis not present

## 2019-09-08 NOTE — Progress Notes (Signed)
PROGRESS NOTE    [name]  OQH:[id number] DOB: April 02, 1916 DOA: 09/06/2019 PCP: Leonard Downing, MD    Brief Narrative:  [name] is an 86 y.o. female who lives independently with PMH significant for HTN, atrial fibrillation/flutter on aspirin 81 mg daily, BPH/dizziness, gait instability who was in her usual state of reasonable health until earlier today when she stepped off a uneven step and fell.  She had immediate pain on her left shoulder left hip.  Patient was brought to ED for further evaluation.  Patient denies hitting her head, being confused.  Patient denies any chest pain or palpitation or shortness of breath.  She notes she was feeling entirely well until she fell this morning.ED Course:  The patient was noted to be afebrile and normotensive.  X-rays revealed left superior and inferior pubic rami fractures as well as a fracture of left proximal humerus.      Consultants:   Ortho  Procedures:   Antimicrobials:       Subjective: No new complaints.  Pain is controlled overall.  Objective: Vitals:   09/07/19 0810 09/07/19 1940 09/08/19 0008 09/08/19 0738  BP: (!) 129/49 (!) 118/53 (!) 111/50 (!) 124/55  Pulse: 69 86 94 66  Resp: 14 16 20 15   Temp: 98.7 F (37.1 C) 98.2 F (36.8 C) 98.2 F (36.8 C) 97.9 F (36.6 C)  TempSrc: Oral Oral Oral Oral  SpO2: 100%  98% 100%  Weight:        Intake/Output Summary (Last 24 hours) at 09/08/2019 1326 Last data filed at 09/07/2019 1800 Gross per 24 hour  Intake 500 ml  Output 250 ml  Net 250 ml   Filed Weights   09/06/19 1111  Weight: 44.9 kg    Examination:  General exam: Appears calm and comfortable sitting up in bed, NAD Respiratory system: CTA, no wheeze rales rhonchi's  Cardiovascular system: RRR, S1-S2 no murmurs  Gastrointestinal system: Soft nontender nondistended normal bowel sounds heard.+ostomy Central nervous system: Alert and oriented x3.  Neuro grossly intact Extremities: No edema or  cyanosis left shoulder in a sling Skin warm and dry Psychiatry: Mood and affect appropriate in current setting     Data Reviewed: I have personally reviewed following labs and imaging studies  CBC: Recent Labs  Lab 09/06/19 1214 09/07/19 0509  WBC 20.8* 10.2  HGB 12.2 10.1*  HCT 37.6 29.6*  MCV 96.4 94.3  PLT 170 546*   Basic Metabolic Panel: Recent Labs  Lab 09/06/19 1214 09/07/19 0509  NA 138 137  K 4.1 4.5  CL 103 101  CO2 24 27  GLUCOSE 110* 135*  BUN 21 26*  CREATININE 0.65 0.80  CALCIUM 9.1 8.7*   GFR: Estimated Creatinine Clearance: 25.8 mL/min (by C-G formula based on SCr of 0.8 mg/dL). Liver Function Tests: Recent Labs  Lab 09/06/19 1214  AST 28  ALT 17  ALKPHOS 60  BILITOT 0.9  PROT 7.1  ALBUMIN 3.6   No results for input(s): LIPASE, AMYLASE in the last 168 hours. No results for input(s): AMMONIA in the last 168 hours. Coagulation Profile: No results for input(s): INR, PROTIME in the last 168 hours. Cardiac Enzymes: No results for input(s): CKTOTAL, CKMB, CKMBINDEX, TROPONINI in the last 168 hours. BNP (last 3 results) No results for input(s): PROBNP in the last 8760 hours. HbA1C: No results for input(s): HGBA1C in the last 72 hours. CBG: No results for input(s): GLUCAP in the last 168 hours. Lipid Profile: No results for  input(s): CHOL, HDL, LDLCALC, TRIG, CHOLHDL, LDLDIRECT in the last 72 hours. Thyroid Function Tests: No results for input(s): TSH, T4TOTAL, FREET4, T3FREE, THYROIDAB in the last 72 hours. Anemia Panel: No results for input(s): VITAMINB12, FOLATE, FERRITIN, TIBC, IRON, RETICCTPCT in the last 72 hours. Sepsis Labs: No results for input(s): PROCALCITON, LATICACIDVEN in the last 168 hours.  Recent Results (from the past 240 hour(s))  SARS Coronavirus 2 by RT PCR (hospital order, performed in [facility] hospital lab) Nasopharyngeal Nasopharyngeal Swab     Status: None   Collection Time: 09/06/19 12:14 PM   Specimen:  Nasopharyngeal Swab  Result Value Ref Range Status   SARS Coronavirus 2 NEGATIVE NEGATIVE Final    Comment: (NOTE) SARS-CoV-2 target nucleic acids are NOT DETECTED.  The SARS-CoV-2 RNA is generally detectable in upper and lower respiratory specimens during the acute phase of infection. The lowest concentration of SARS-CoV-2 viral copies this assay can detect is 250 copies / mL. A negative result does not preclude SARS-CoV-2 infection and should not be used as the sole basis for treatment or other patient management decisions.  A negative result may occur with improper specimen collection / handling, submission of specimen other than nasopharyngeal swab, presence of viral mutation(s) within the areas targeted by this assay, and inadequate number of viral copies (<250 copies / mL). A negative result must be combined with clinical observations, patient history, and epidemiological information.  Fact Sheet for Patients:   StrictlyIdeas.no  Fact Sheet for Healthcare Providers: BankingDealers.co.za  This test is not yet approved or  cleared by the [place] FDA and has been authorized for detection and/or diagnosis of SARS-CoV-2 by FDA under an Emergency Use Authorization (EUA).  This EUA will remain in effect (meaning this test can be used) for the duration of the COVID-19 declaration under Section 564(b)(1) of the Act, 21 U.S.C. section 360bbb-3(b)(1), unless the authorization is terminated or revoked sooner.  Performed at [facility], [address] [place], [place] [address]          Radiology Studies: No results found.      Scheduled Meds: . aspirin EC  81 mg Oral Daily  . enoxaparin (LOVENOX) injection  30 mg Subcutaneous Q24H  . furosemide  40 mg Oral Daily  . lidocaine  1 patch Transdermal Q24H  . melatonin  5 mg Oral QHS  . metoprolol succinate  25 mg Oral Q2000  . traMADol  50 mg Oral Q6H    Continuous Infusions:   Assessment & Plan:   Principal Problem:   Closed fracture of multiple pubic rami, left, initial encounter ([place]) Active Problems:   Rectal cancer, pT2uN0(pNX) s/p TEM partial proctectomy   CAD (coronary artery disease)   HTN (hypertension)   PAF (paroxysmal atrial fibrillation) (HCC)   Depression with anxiety   Bilateral pubic rami fracture Pain controlled Add lidocaine patch Patient notes she gets nausea to almost all pain medications including hydrocodone and oxycodone Continue tramadol 50 mg every 6 with as needed Zofran Ortho is input appreciated-patient may weight-bear as tolerated on the left lower extremity.  Continue sling for left upper extremity Use hemiwalker or cane to avoid weightbearing on the left shoulder No surgical intervention at this time PT/OT-recommend SNF Case mx notified   Proximal humerus fracture Left shoulder sling requested Pain management as noted above Continue with lidocaine patch  Atrial fibrillation Continue Toprol-XL for rate control Continue asa.  History of rectal cancer with colostomy Continue MiraLAX as needed  DVT prophylaxis: Lovenox Code Status: DNR Family Communication: None at bedside Disposition Plan: SNF Status is: Inpatient  Remains inpatient appropriate because:Unsafe d/c plan   Dispo: The patient is from: Home              Anticipated d/c is to: SNF              Anticipated d/c date is: 1 day              Patient currently is not medically stable to d/c.SNF pending. [facility] working on this            LOS: 2 days   Time spent: 35 minutes with more than 50% COC    Nolberto Hanlon, MD [facility] Pager [phone]  If 7PM-7AM, please contact night-coverage [email or web address] Password [email or web address] 09/08/2019, 1:26 PM

## 2019-09-08 NOTE — Evaluation (Signed)
Physical Therapy Evaluation Patient Details Name: [name] MRN: [id number] DOB: [birth year] Today's Date: 09/08/2019   History of Present Illness  Pt is an 86 y.o. female with PMH significant for HTN, STEMI, stent placement, atrial fibrillation/flutter on aspirin 81 mg daily, BPH/dizziness, rectal cancer (w/ colostomy), and gait instability. Pt presents today s/p bilateral pubic rami fx and proximal L humerus fx after stepping off uneven step and falling.  Clinical Impression  Pt pleasant and motivated to participate during the session. Pt overall weak with supine exercises; requiring mod assist for bilateral SLR. Pt mod-I w/ bed mobility requiring significantly increased time for supine-sit but no need for physical assist. Pt demonstrated fair sitting balance w/ a R lateral lean likely due to L sided pain. Pt able to transfer sit-stand with mod cueing on foot hand placement and min-A to come to full standing. Pt appeared to be reluctant to put weight through LLE and had a fairly significant lean to the R with multiple attempts to reach across and use LUE on hemiwalker. Pt able to briefly march and shift weight through BLE in standing with reported pain 8/10 with weight shift onto LLE. Ambulation attempted and pt took ~6 very small, effortful, shuffled steps forward with a step-to pattern and LLE antalgia w/ significant lean to the RUE on hemiwalker. Pt reported nausea wanted to sit down at this point. Pt took ~2 backward steps before assisted back to sitting at EOB. Pt given ice water and cooling rag and nurse notified about nausea. Pt remained fairly nauseous in sitting and w/ +2 assist was helped back to supine. Pt on 3L O2 throughout session; SpO2 monitored and remained in low 90s% throughout. Pt will benefit from PT services in a SNF setting upon discharge to safely address deficits listed in patient problem list for decreased caregiver assistance and eventual return to PLOF.   Follow Up  Recommendations SNF    Equipment Recommendations  None recommended by PT    Recommendations for Other Services       Precautions / Restrictions Precautions Precautions: None Restrictions Weight Bearing Restrictions: Yes LUE Weight Bearing: Non weight bearing LLE Weight Bearing: Weight bearing as tolerated Other Position/Activity Restrictions: per MD note: pt can bear weight through LUE w/ RW if necessary but inc risk of fx displacement. Should attempt hemiwalker      Mobility  Bed Mobility Overal bed mobility: Needs Assistance Bed Mobility: Supine to Sit;Sit to Supine     Supine to sit: Modified independent (Device/Increase time) Sit to supine: Mod assist;+2 for physical assistance   General bed mobility comments: significantly increased time supine-sit, no physical assist needed, pt use of UE to help move LLE across bed: +2 assist provided sit-supine secondary to pt fatigue/nausea  Transfers Overall transfer level: Needs assistance Equipment used: Hemi-walker Transfers: Sit to/from Stand Sit to Stand: Min assist         General transfer comment: min A to help come to full standing; pt appeared to have some reluctancy to put weight through LLE and tried to reach across to hemiwalker w/ LUE. Mod cueing for hand and foot positioning  Ambulation/Gait Ambulation/Gait assistance: Mod assist;Min assist Gait Distance (Feet): 1 Feet Assistive device: Hemi-walker Gait Pattern/deviations: Step-to pattern;Decreased step length - right;Decreased step length - left;Decreased stance time - right;Decreased stance time - left;Decreased weight shift to left;Shuffle;Trunk flexed;Antalgic Gait velocity: decreased   General Gait Details: Pt demonstrated ability to shift weight through BLE in standing and attempted small steps forward. Pt  had a step-to shuffling pattern with LLE antagia and significant lean to the RUE on hemiwalker. Pt motivated and took ~6 very small shuffled steps  forward before reporting nausea and needing to sit down. Took ~2 small backwards steps and helped assist pt back to sitting.  Stairs            Wheelchair Mobility    Modified Rankin (Stroke Patients Only)       Balance Overall balance assessment: Needs assistance Sitting-balance support: Single extremity supported;Feet supported Sitting balance-Leahy Scale: Fair Sitting balance - Comments: able to remain upright w/o physical assist; fairly strong R lateral lean at times likely secondary to left sided pain Postural control: Right lateral lean Standing balance support: Single extremity supported;During functional activity Standing balance-Leahy Scale: Fair Standing balance comment: fairly signifiant lean to  R side / hemiwalker in standing likely secondary to L pain. Mod assist by OT on R. Difficulty shifting weight through LLE                             Pertinent Vitals/Pain Pain Assessment: Faces Faces Pain Scale: Hurts little more Pain Location: L hip, L shoulder; pt reported 8/10 pain shifting weight onto LLE in standing Pain Descriptors / Indicators: Grimacing;Sore;Aching Pain Intervention(s): Monitored during session;Repositioned;Utilized relaxation techniques;Limited activity within patient's tolerance    Home Living Family/patient expects to be discharged to:: Private residence Living Arrangements: Alone Available Help at Discharge: Family;Neighbor;Available PRN/intermittently Type of Home: House Home Access: Ramped entrance;Stairs to enter Entrance Stairs-Rails: Can reach both Entrance Stairs-Number of Steps: pt has a ramped entrance but states using stair entrance which has 8, 1/2 steps Home Layout: One level Home Equipment: Walker - 2 wheels;Cane - single point;Grab bars - tub/shower;Hand held shower head;Shower seat - built in      Prior Function Level of Independence: Independent with assistive device(s)         Comments: per pt,  independent w/ use of SPC for community ambulation. Pt states that pastor gets on her when she doesn't have her cane at church (indicating inconsistent cane use). Pt states neighbor drives her to the grocery store but she does all the shopping herself.     Hand Dominance   Dominant Hand: Right    Extremity/Trunk Assessment   Upper Extremity Assessment Upper Extremity Assessment: Defer to OT evaluation    Lower Extremity Assessment Lower Extremity Assessment: Generalized weakness       Communication   Communication: Deaf;HOH (pt cannot hear from R ear. Wears hearing aid and appeared to have minimal difficulty hearing w/ L ear throughout session)  Cognition Arousal/Alertness: Awake/alert Behavior During Therapy: WFL for tasks assessed/performed Overall Cognitive Status: Within Functional Limits for tasks assessed                                 General Comments: pt A&Ox4; excellent cognition      General Comments General comments (skin integrity, edema, etc.): Pt on 3L O2 throughout session    Exercises Total Joint Exercises Ankle Circles/Pumps: AROM;Both;10 reps Straight Leg Raises: AROM;Strengthening;Both;Other (comment);AAROM (2) Long Arc Quad: AROM;Strengthening;Both;Other (comment);AAROM (2) Marching in Standing: AROM;Strengthening;Both;Other (comment) (2) Other Exercises Other Exercises: education on use of hemiwalker   Assessment/Plan    PT Assessment Patient needs continued PT services  PT Problem List Decreased strength;Decreased activity tolerance;Decreased balance;Decreased mobility;Decreased knowledge of use of DME;Pain  PT Treatment Interventions DME instruction;Gait training;Stair training;Functional mobility training;Therapeutic activities;Therapeutic exercise;Balance training;Patient/family education    PT Goals (Current goals can be found in the Care Plan section)  Acute Rehab PT Goals Patient Stated Goal: to walk better PT Goal  Formulation: With patient Time For Goal Achievement: 09/21/19 Potential to Achieve Goals: Good    Frequency 7X/week   Barriers to discharge        Co-evaluation PT/OT/SLP Co-Evaluation/Treatment: Yes Reason for Co-Treatment: Complexity of the patient's impairments (multi-system involvement);To address functional/ADL transfers PT goals addressed during session: Mobility/safety with mobility;Proper use of DME;Balance OT goals addressed during session: ADL's and self-care;Strengthening/ROM       AM-PAC PT "6 Clicks" Mobility  Outcome Measure Help needed turning from your back to your side while in a flat bed without using bedrails?: None Help needed moving from lying on your back to sitting on the side of a flat bed without using bedrails?: None Help needed moving to and from a bed to a chair (including a wheelchair)?: A Little Help needed standing up from a chair using your arms (e.g., wheelchair or bedside chair)?: A Little Help needed to walk in hospital room?: A Lot Help needed climbing 3-5 steps with a railing? : Total 6 Click Score: 17    End of Session Equipment Utilized During Treatment: Gait belt;Oxygen Activity Tolerance: Patient limited by pain;Other (comment) (patient limited by nausea in standing) Patient left: in bed;with bed alarm set;with nursing/sitter in room;with call bell/phone within reach Nurse Communication: Mobility status;Weight bearing status PT Visit Diagnosis: Unsteadiness on feet (R26.81);Muscle weakness (generalized) (M62.81);Difficulty in walking, not elsewhere classified (R26.2);Pain Pain - Right/Left: Left Pain - part of body: Shoulder;Hip    Time: 2841-3244 PT Time Calculation (min) (ACUTE ONLY): 38 min   Charges:              Adalena Abdulla SPT 09/08/19, 10:58 AM

## 2019-09-08 NOTE — Progress Notes (Signed)
  Subjective:  Patient is reevaluated today.  She is sitting in bed eating her dinner.  Patient states that she has had some persistent left hip and pelvic pain but is not having significant left shoulder pain.  Patient states that she worked with physical therapy today and was able to take a couple steps with a hemiwalker.  Objective:   VITALS:   Vitals:   09/07/19 1940 09/08/19 0008 09/08/19 0738 09/08/19 1626  BP: (!) 118/53 (!) 111/50 (!) 124/55 (!) 106/47  Pulse: 86 94 66 70  Resp: 16 20 15 16   Temp: 98.2 F (36.8 C) 98.2 F (36.8 C) 97.9 F (36.6 C) (!) 97.5 F (36.4 C)  TempSrc: Oral Oral Oral Oral  SpO2:  98% 100% 94%  Weight:        PHYSICAL EXAM: Left upper extremity: Patient's left upper extremity is in a sling.  She has full digital and wrist range of motion and intact sensation to light touch.  She has mild tenderness over the proximal humerus.  Left lower extremity: Neurovascular intact Sensation intact distally Intact pulses distally Dorsiflexion/Plantar flexion intact No cellulitis present Compartment soft  LABS  No results found for this or any previous visit (from the past 24 hour(s)).  No results found.  Assessment/Plan:     Principal Problem:   Closed fracture of multiple pubic rami, left, initial encounter ([place]) Active Problems:   Rectal cancer, pT2uN0(pNX) s/p TEM partial proctectomy   CAD (coronary artery disease)   HTN (hypertension)   PAF (paroxysmal atrial fibrillation) (HCC)   Depression with anxiety  Continue with physical therapy.  Patient will need skilled nursing facility upon discharge.  He may follow-up in our office in approximately 2 weeks for reevaluation or may follow-up with an orthopedist in [place] if she goes to a skilled nursing facility there.    Thornton Park , MD 09/08/2019, 6:11 PM

## 2019-09-08 NOTE — NC FL2 (Signed)
[place] LEVEL OF CARE SCREENING TOOL     IDENTIFICATION  Patient Name: [name] Birthdate: 12-22-1916 Sex: female Admission Date (Current Location): 09/06/2019  [place] and [place] Number:  Engineering geologist and Address:  [facility], [address] [place], [place] [address]      Provider Number: [id number]  Attending Physician Name and Address:  Nolberto Hanlon, MD  Relative Name and Phone Number:  Thayer Headings niece [phone]    Current Level of Care: Hospital Recommended Level of Care: [place] Prior Approval Number:    Date Approved/Denied:   PASRR Number: [id number]  Discharge Plan: SNF    Current Diagnoses: Patient Active Problem List   Diagnosis Date Noted  . Closed fracture of multiple pubic rami, left, initial encounter ([place]) 09/06/2019  . Slurred speech 02/23/2017  . PAF (paroxysmal atrial fibrillation) ([place]) 02/23/2017  . Depression with anxiety 02/23/2017  . TIA (transient ischemic attack)   . Fracture, tibia and fibula 07/06/2015  . Tibia/fibula fracture 07/03/2015  . Lung nodule seen on imaging study 09/05/2013  . Exposure to TB 09/05/2013  . SOB (shortness of breath) 10/02/2011  . Vascular skin changes 07/17/2011  . 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 Anorexia post-op 12/08/2010  . HTN (hypertension) 11/03/2010  . Dizziness 09/15/2010  . CAD (coronary artery disease)   . Hyperlipidemia   . Rectal cancer, pT2uN0(pNX) s/p TEM partial proctectomy 09/07/2010    Orientation RESPIRATION BLADDER Height & Weight     Self, Time, Situation, Place  O2 (2 liters) External catheter Weight: 44.9 kg Height:     BEHAVIORAL SYMPTOMS/MOOD NEUROLOGICAL BOWEL NUTRITION STATUS      Continent Diet (Heart Healthy)  AMBULATORY STATUS COMMUNICATION OF NEEDS Skin   Extensive Assist Verbally Bruising                       Personal Care Assistance Level of Assistance   Bathing, Dressing, Feeding Bathing Assistance: Limited assistance Feeding assistance: Limited assistance Dressing Assistance: Limited assistance     Functional Limitations Info             SPECIAL CARE FACTORS FREQUENCY  PT (By licensed PT), OT (By licensed OT)     PT Frequency: 5 times per week OT Frequency: 5 times per week            Contractures Contractures Info: Not present    Additional Factors Info  Code Status, Allergies Code Status Info: DNR Allergies Info: Effexor Venlafaxine Hydrochloride, Penicillins, Sulfa Antibiotics, Dicyclomine, Lactose Intolerance Hydrocodone           Current Medications (09/08/2019):  This is the current hospital active medication list Current Facility-Administered Medications  Medication Dose Route Frequency Provider Last Rate Last Admin  . aspirin EC tablet 81 mg  81 mg Oral Daily Bonnell Public Tublu, MD   81 mg at 09/08/19 0949  . enoxaparin (LOVENOX) injection 30 mg  30 mg Subcutaneous Q24H Rocky Morel, RPH   30 mg at 09/08/19 0948  . furosemide (LASIX) tablet 40 mg  40 mg Oral Daily Bonnell Public Tublu, MD   40 mg at 09/08/19 0949  . lidocaine (LIDODERM) 5 % 1 patch  1 patch Transdermal Q24H Nolberto Hanlon, MD   1 patch at 09/08/19 0955  . melatonin tablet 5 mg  5 mg Oral QHS Bonnell Public Tublu, MD      . metoprolol succinate (TOPROL-XL) 24 hr tablet  25 mg  25 mg Oral Q2000 Nolberto Hanlon, MD   25 mg at 09/07/19 1941  . morphine 2 MG/ML injection 1 mg  1 mg Intravenous Q3H PRN Bonnell Public Tublu, MD   1 mg at 09/07/19 1040  . nitroGLYCERIN (NITROSTAT) SL tablet 0.4 mg  0.4 mg Sublingual Q5 min PRN Bonnell Public Tublu, MD      . ondansetron [facility]) tablet 8 mg  8 mg Oral Q8H PRN Vashti Hey, MD   8 mg at 09/08/19 0950  . polyethylene glycol (MIRALAX / GLYCOLAX) packet 17 g  17 g Oral Daily PRN Bonnell Public Tublu, MD      . traMADol Veatrice Bourbon) tablet 50 mg  50 mg Oral Q6H  Bonnell Public Tublu, MD   50 mg at 09/08/19 1210     Discharge Medications: Please see discharge summary for a list of discharge medications.  Relevant Imaging Results:  Relevant Lab Results:   Additional Information SSN: [id number]  Su Hilt, RN

## 2019-09-08 NOTE — Evaluation (Signed)
Occupational Therapy Evaluation Patient Details Name: [name] MRN: [id number] DOB: [birth year] Today's Date: 09/08/2019    History of Present Illness Pt is an 86 y.o. female with PMH significant for HTN, STEMI, stent placement, atrial fibrillation/flutter on aspirin 81 mg daily, BPH/dizziness, rectal cancer (w/ colostomy), and gait instability. Pt presents today s/p bilateral pubic rami fx and proximal L humerus fx after stepping off uneven step and falling.   Clinical Impression   Ms Joens was seen for OT evaluation this date. Prior to hospital admission, pt was MOD I for mobility and ADLs using SPC, pt received rides to grocery store from neighbour and 1x/week personal attendant cleaned house. Pt lives alone. Pt presents to acute OT demonstrating impaired ADL performance and functional mobility 2/2 functional strneght/ROM/balance deficits, decreased activity tolerance, and impaired use of nondominant LUE. Pt currently requires MAX A don B socks at bed level. SETUP self-drinking and face washing at bed level. SUPERVISION + increased time to exit R side of bed. Single UE support for sitting balance c R lateral lean decreasing to CGA intermittently t/o ~12 mins seated EOB. MIN A + R hemiwalker sit<>stand and ~4 steps at EOB c MOD A for balance when weight shifting to LLE. Pt would benefit from skilled OT to address noted impairments and functional limitations (see below for any additional details) in order to maximize safety and independence while minimizing falls risk and caregiver burden. Upon hospital discharge, recommend STR to maximize pt safety and return to PLOF.     Follow Up Recommendations  SNF    Equipment Recommendations   (TBD )    Recommendations for Other Services       Precautions / Restrictions Precautions Precautions: Fall Restrictions Weight Bearing Restrictions: Yes LUE Weight Bearing: Non weight bearing LLE Weight Bearing: Weight bearing as tolerated Other  Position/Activity Restrictions: per MD note: pt can bear weight through LUE w/ RW if necessary but inc risk of fx displacement. Should attempt hemiwalker      Mobility Bed Mobility Overal bed mobility: Needs Assistance Bed Mobility: Supine to Sit;Sit to Supine     Supine to sit: Supervision;HOB elevated Sit to supine: Mod assist;+2 for physical assistance   General bed mobility comments: significantly increased time sup>sit but no physical assist needed, use RUE to help move LLE across bed: +2 assist provided sit>supine 2/2 pt fatigue/nausea  Transfers Overall transfer level: Needs assistance Equipment used: Hemi-walker Transfers: Sit to/from Stand Sit to Stand: Min assist;From elevated surface         General transfer comment: MIN A achieve upright posture and safe hemiwalker technique, +1 to assist with LUE NWBing pcns    Balance Overall balance assessment: Needs assistance Sitting-balance support: Single extremity supported;Feet supported Sitting balance-Leahy Scale: Fair Sitting balance - Comments: SBA + RUE support on bed - fairly strong R lateral lean at times likely 2/2 left sided pain Postural control: Right lateral lean Standing balance support: Single extremity supported;During functional activity Standing balance-Leahy Scale: Poor Standing balance comment: MOD A + R hemiwalker during weightshifting for small steps at EOB                            ADL either performed or assessed with clinical judgement   ADL Overall ADL's : Needs assistance/impaired  General ADL Comments: MAX A don B socks at bed level. SETUP self-drinking and face washing at bed level.      Vision Baseline Vision/History: Wears glasses Wears Glasses: Reading only       Perception     Praxis      Pertinent Vitals/Pain Pain Assessment: 0-10 Pain Score: 8  Faces Pain Scale: Hurts little more Pain Location: L hip, L  shoulder; pt reported 8/10 pain shifting weight onto LLE in standing Pain Descriptors / Indicators: Grimacing;Sore;Aching Pain Intervention(s): Monitored during session;Repositioned;Patient requesting pain meds-RN notified     Hand Dominance Right   Extremity/Trunk Assessment Upper Extremity Assessment Upper Extremity Assessment: RUE deficits/detail;LUE deficits/detail RUE Deficits / Details: AROM grossly WFL, 5/5 grip  LUE Deficits / Details: Achieves full fist LUE: Unable to fully assess due to immobilization   Lower Extremity Assessment Lower Extremity Assessment: Generalized weakness       Communication Communication Communication: HOH (pt cannot hear from R ear. Wears hearing aid L ear)   Cognition Arousal/Alertness: Awake/alert Behavior During Therapy: WFL for tasks assessed/performed Overall Cognitive Status: Within Functional Limits for tasks assessed                                 General Comments: pt A&Ox4; excellent cognition   General Comments  SpO2 91% on 3L [place] t/o session - RN in room aware    Exercises Exercises: Other exercises Total Joint Exercises Ankle Circles/Pumps: AROM;Both;10 reps Straight Leg Raises: AROM;Strengthening;Both;Other (comment);AAROM (2) Long Arc Quad: AROM;Strengthening;Both;Other (comment);AAROM (2) Marching in Standing: AROM;Strengthening;Both;Other (comment) (2) Other Exercises Other Exercises: Pt educated re: OT role, DME recs, d/c recs, falls prevention, pain mgmt, ECS, importance of mobility for functional strengthening Other Exercises: don/doff B socks, self-drinking, face washing, sup<>sit, sit<>stand, ~4 steps, sitting/standing balance/tolerance   Shoulder Instructions      Home Living Family/patient expects to be discharged to:: Private residence Living Arrangements: Alone Available Help at Discharge: Family;Neighbor;Available PRN/intermittently Type of Home: House Home Access: Ramped entrance;Stairs to  enter Entrance Stairs-Number of Steps: pt has a ramped entrance but prefers using 1/5 steps entrance (8 small STE) Entrance Stairs-Rails: Can reach both Home Layout: One level     Bathroom Shower/Tub: Occupational psychologist: Handicapped height Bathroom Accessibility: Yes   Home Equipment: Cane - single point;Grab bars - tub/shower;Shower seat - built in   Additional Comments: Pt reports built in shower seat is too far from water to be functional       Prior Functioning/Environment Level of Independence: Independent with assistive device(s)        Comments: per pt, independent w/ use of SPC for community ambulation. Pt bakes for neighbours. Pt states neighbour drives her to the grocery store. Pt stands in shower at baseline and manages colostomy bag.         OT Problem List: Decreased strength;Decreased range of motion;Decreased activity tolerance;Impaired balance (sitting and/or standing);Decreased knowledge of use of DME or AE;Impaired UE functional use      OT Treatment/Interventions: Self-care/ADL training;Therapeutic exercise;Energy conservation;DME and/or AE instruction;Therapeutic activities;Patient/family education;Balance training    OT Goals(Current goals can be found in the care plan section) Acute Rehab OT Goals Patient Stated Goal: to walk better OT Goal Formulation: With patient Time For Goal Achievement: 09/22/19 Potential to Achieve Goals: Good ADL Goals Pt Will Perform Grooming: with set-up;sitting;with supervision Pt Will Perform Upper Body Dressing: with min assist;with caregiver independent in assisting;sitting  Pt Will Perform Lower Body Dressing: with mod assist;sit to/from stand (c LRAD PRN) Pt Will Transfer to Toilet: with min assist;stand pivot transfer;bedside commode (c LRAD PRN)  OT Frequency: Min 2X/week   Barriers to D/C: Decreased caregiver support          Co-evaluation PT/OT/SLP Co-Evaluation/Treatment: Yes Reason for  Co-Treatment: Complexity of the patient's impairments (multi-system involvement);To address functional/ADL transfers PT goals addressed during session: Mobility/safety with mobility;Balance;Proper use of DME OT goals addressed during session: ADL's and self-care;Proper use of Adaptive equipment and DME      AM-PAC OT "6 Clicks" Daily Activity     Outcome Measure Help from another person eating meals?: A Little Help from another person taking care of personal grooming?: A Little Help from another person toileting, which includes using toliet, bedpan, or urinal?: A Lot (pt has colostomy bag ) Help from another person bathing (including washing, rinsing, drying)?: A Lot Help from another person to put on and taking off regular upper body clothing?: A Lot Help from another person to put on and taking off regular lower body clothing?: A Lot 6 Click Score: 14   End of Session Equipment Utilized During Treatment: Oxygen;Other (comment) (3L [place], hemiwalker ) Nurse Communication:  (Pt request nausea meds)  Activity Tolerance: Patient tolerated treatment well Patient left: in bed;with call bell/phone within reach;with bed alarm set;with nursing/sitter in room  OT Visit Diagnosis: Unsteadiness on feet (R26.81);Other abnormalities of gait and mobility (R26.89)                Time: 2956-2130 OT Time Calculation (min): 50 min Charges:  OT General Charges $OT Visit: 1 Visit OT Evaluation $OT Eval Moderate Complexity: 1 Mod OT Treatments $Self Care/Home Management : 8-22 mins $Therapeutic Activity: 8-22 mins  Dessie Coma, M.S. OTR/L  09/08/19, 1:13 PM

## 2019-09-08 NOTE — TOC Progression Note (Signed)
Transition of Care [facility]) - Progression Note    Patient Details  Name: [name] MRN: [id number] Date of Birth: [birth year]  [facility]) CM/SW Contact  Su Hilt, RN Phone Number: 09/08/2019, 2:03 PM  Clinical Narrative:   Spoke with the patient at the bedside about going to rehab she is agreeable and wants the bedsearch to be done in [place], she stated that she is from [place], she hopes to go to [facility] or [facility], she has been to [facility] before, she is agreeable to a bed search and we will review the bed offers once obtained    Expected Discharge Plan: [place] Barriers to Discharge: Continued Medical Work up  Expected Discharge Plan and Services Expected Discharge Plan: [place] arrangements for the past 2 months: Single Family Home                                       Social Determinants of Health (SDOH) Interventions    Readmission Risk Interventions No flowsheet data found.

## 2019-09-09 DIAGNOSIS — S32592A Other specified fracture of left pubis, initial encounter for closed fracture: Secondary | ICD-10-CM | POA: Diagnosis not present

## 2019-09-09 LAB — CBC
HCT: 25.2 % — ABNORMAL LOW (ref 36.0–46.0)
Hemoglobin: 8.5 g/dL — ABNORMAL LOW (ref 12.0–15.0)
MCH: 32 pg (ref 26.0–34.0)
MCHC: 33.7 g/dL (ref 30.0–36.0)
MCV: 94.7 fL (ref 80.0–100.0)
Platelets: 136 10*3/uL — ABNORMAL LOW (ref 150–400)
RBC: 2.66 MIL/uL — ABNORMAL LOW (ref 3.87–5.11)
RDW: 12.5 % (ref 11.5–15.5)
WBC: 10.3 10*3/uL (ref 4.0–10.5)
nRBC: 0 % (ref 0.0–0.2)

## 2019-09-09 MED ORDER — ACETAMINOPHEN 325 MG PO TABS
650.0000 mg | ORAL_TABLET | Freq: Four times a day (QID) | ORAL | Status: DC | PRN
  Administered 2019-09-09 – 2019-09-11 (×6): 650 mg via ORAL
  Filled 2019-09-09 (×7): qty 2

## 2019-09-09 NOTE — Care Management Important Message (Signed)
Important Message  Patient Details  Name: [name] MRN: [id number] Date of Birth: 07/15/1916   Medicare Important Message Given:  N/A - LOS <3 / Initial given by admissions     [name] 09/09/2019, 7:17 AM

## 2019-09-09 NOTE — Progress Notes (Signed)
  Subjective:  Patient is up out of bed to a chair.  She states she feels tired but denies any significant pain.  Objective:   VITALS:   Vitals:   09/08/19 1626 09/08/19 2042 09/09/19 0004 09/09/19 0803  BP: (!) 106/47 (!) 116/56 (!) 122/64 (!) 114/47  Pulse: 70 92 78 70  Resp: 16 18 14 17   Temp: (!) 97.5 F (36.4 C) 98.3 F (36.8 C) 98.2 F (36.8 C) 98.2 F (36.8 C)  TempSrc: Oral Oral Oral Oral  SpO2: 94% 96% 100% 98%  Weight:        PHYSICAL EXAM: Left upper extremity: Skin overlying the left upper extremity remains intact.  There is no significant erythema ecchymosis or swelling observed.  Patient has intact sensation throughout the left upper extremity.  She has full digital and wrist range of motion, intact sensation light touch and a palpable radial pulse.  Left lower extremity: Neurovascular intact, Sensation intact distally, although diminished due to baseline neuropathy Intact pulses distally Dorsiflexion/Plantar flexion intact No cellulitis present Compartment soft  LABS  Results for orders placed or performed during the hospital encounter of 09/06/19 (from the past 24 hour(s))  CBC     Status: Abnormal   Collection Time: 09/09/19  4:54 AM  Result Value Ref Range   WBC 10.3 4.0 - 10.5 K/uL   RBC 2.66 (L) 3.87 - 5.11 MIL/uL   Hemoglobin 8.5 (L) 12.0 - 15.0 g/dL   HCT 25.2 (L) 36 - 46 %   MCV 94.7 80.0 - 100.0 fL   MCH 32.0 26.0 - 34.0 pg   MCHC 33.7 30.0 - 36.0 g/dL   RDW 12.5 11.5 - 15.5 %   Platelets 136 (L) 150 - 400 K/uL   nRBC 0.0 0.0 - 0.2 %    No results found.  Assessment/Plan:     Principal Problem:   Closed fracture of multiple pubic rami, left, initial encounter ([place]) Active Problems:   Rectal cancer, pT2uN0(pNX) s/p TEM partial proctectomy   CAD (coronary artery disease)   HTN (hypertension)   PAF (paroxysmal atrial fibrillation) (HCC)   Depression with anxiety  Patient stable from an orthopedic standpoint.  She should continue  with physical and occupational therapy.  Patient will benefit from a skilled nursing facility upon discharge.  None of the patient's fractures should require surgical intervention.  She will follow-up in the office in approximately 2 weeks after discharge from the hospital.  Patient is requesting to go to a skilled nursing facility in [place].  She may follow-up with an orthopedist locally in [place] if she wishes.  Patient may weight-bear as tolerated on the left lower extremity and ideally should remain nonweightbearing on the left upper extremity and use a hemiwalker, and less the patient has issues with stability and must use her left upper extremity to manipulate a standard rolling walker.    Thornton Park , MD 09/09/2019, 4:05 PM

## 2019-09-09 NOTE — TOC Progression Note (Signed)
Transition of Care [facility]) - Progression Note    Patient Details  Name: [name] MRN: [id number] Date of Birth: [birth year]  Transition of Care [facility]) CM/SW Foster, RN Phone Number: 09/09/2019, 1:18 PM  Clinical Narrative:  Notified the patient that the [facility] in [place] has had a patient test positive and until they test negative she will not be able to accept visitors until the employee tests negative, she stated understanding and still wants to go to [facility]     Expected Discharge Plan: [place] Barriers to Discharge: Continued Medical Work up  Expected Discharge Plan and Services Expected Discharge Plan: [place] arrangements for the past 2 months: Single Family Home                                       Social Determinants of Health (SDOH) Interventions    Readmission Risk Interventions No flowsheet data found.

## 2019-09-09 NOTE — Progress Notes (Signed)
Physical Therapy Treatment Patient Details Name: [name] MRN: [id number] DOB: [birth year] Today's Date: 09/09/2019    History of Present Illness Pt is an 86 y.o. female with PMH significant for HTN, STEMI, stent placement, atrial fibrillation/flutter on aspirin 81 mg daily, BPH/dizziness, rectal cancer (w/ colostomy), and gait instability. Pt presents today s/p bilateral pubic rami fx and proximal L humerus fx after stepping off uneven step and falling.    PT Comments    Patient remains very motivated, eager to participate as able.  Able to complete OOB to chair this date, mod assist +2 for safety.  Optimal performance and comfort/confidence with use of R HHA vs HW; improved midline, balance and overall safety with R HHA. Difficulty with weight shift and limb advancement, L > R, ofen requiring physical assist for advancement and position of L LE with transfer.    Follow Up Recommendations  SNF     Equipment Recommendations       Recommendations for Other Services       Precautions / Restrictions Precautions Precautions: Fall Restrictions Weight Bearing Restrictions: Yes LUE Weight Bearing: Non weight bearing LLE Weight Bearing: Weight bearing as tolerated Other Position/Activity Restrictions: per MD note: pt can bear weight through LUE w/ RW if necessary but inc risk of fx displacement. Should attempt hemiwalker    Mobility  Bed Mobility Overal bed mobility: Needs Assistance Bed Mobility: Supine to Sit       Sit to supine: Mod assist;+2 for physical assistance   General bed mobility comments: transition towards L; step by step buing for sequencing  Transfers Overall transfer level: Needs assistance Equipment used: Hemi-walker Transfers: Sit to/from Stand Sit to Stand: Mod assist;+2 safety/equipment         General transfer comment: significant R lateral lean; poor midline orientation; poor balance.  Constant cuing to minimize use/movement of L UE (patient  consistently reaching for therapist, assist device with L UE)  Ambulation/Gait Ambulation/Gait assistance: Mod assist;+2 physical assistance   Assistive device: 1 person hand held assist       General Gait Details: very fearful, hesitant weight shift and limb advancement, requiring physical assist for advancement and placecment of L LE.  Decreased stance time L LE > R LE   Stairs             Wheelchair Mobility    Modified Rankin (Stroke Patients Only)       Balance Overall balance assessment: Needs assistance Sitting-balance support: No upper extremity supported;Feet supported Sitting balance-Leahy Scale: Fair     Standing balance support: Single extremity supported Standing balance-Leahy Scale: Poor Standing balance comment: heavy R lateral lean/push, limited ability to self-correct                            Cognition Arousal/Alertness: Awake/alert Behavior During Therapy: WFL for tasks assessed/performed Overall Cognitive Status: Within Functional Limits for tasks assessed                                        Exercises Other Exercises Other Exercises: Sit/stand with hemi-walker, mod assist +2 for safety. Heavy R lateral lean/push, constant manual assist for correction and fall prevention. Other Exercises: Sit/stand with R HHA, min/mod assist +1-marked improvement in balance, midline and overall comfort/confidence with HHA    General Comments        Pertinent Vitals/Pain  Pain Assessment: Faces Faces Pain Scale: Hurts little more Pain Location: L hip Pain Descriptors / Indicators: Grimacing;Sore;Aching Pain Intervention(s): Limited activity within patient's tolerance;Monitored during session;Repositioned    Home Living                      Prior Function            PT Goals (current goals can now be found in the care plan section) Acute Rehab PT Goals Patient Stated Goal: to walk better Time For Goal  Achievement: 09/21/19 Potential to Achieve Goals: Good Progress towards PT goals: Progressing toward goals    Frequency    7X/week      PT Plan Current plan remains appropriate    Co-evaluation              AM-PAC PT "6 Clicks" Mobility   Outcome Measure  Help needed turning from your back to your side while in a flat bed without using bedrails?: A Lot Help needed moving from lying on your back to sitting on the side of a flat bed without using bedrails?: A Lot Help needed moving to and from a bed to a chair (including a wheelchair)?: A Lot Help needed standing up from a chair using your arms (e.g., wheelchair or bedside chair)?: A Lot Help needed to walk in hospital room?: A Lot Help needed climbing 3-5 steps with a railing? : Total 6 Click Score: 11    End of Session Equipment Utilized During Treatment: Gait belt;Oxygen Activity Tolerance: Patient tolerated treatment well Patient left: in chair;with call bell/phone within reach;with chair alarm set Nurse Communication: Mobility status;Weight bearing status PT Visit Diagnosis: Unsteadiness on feet (R26.81);Muscle weakness (generalized) (M62.81);Difficulty in walking, not elsewhere classified (R26.2);Pain Pain - Right/Left: Left Pain - part of body: Shoulder;Hip     Time: 8115-7262 PT Time Calculation (min) (ACUTE ONLY): 33 min  Charges:  $Therapeutic Activity: 23-37 mins                     Nakaila Freeze H. Owens Shark, PT, DPT, NCS 09/09/19, 10:41 PM [phone]

## 2019-09-09 NOTE — Progress Notes (Signed)
PROGRESS NOTE    [name]  QJF:[id number] DOB: [birth year] DOA: 09/06/2019 PCP: Leonard Downing, MD    Brief Narrative:  [name] is an 86 y.o. female who lives independently with PMH significant for HTN, atrial fibrillation/flutter on aspirin 81 mg daily, BPH/dizziness, gait instability who was in her usual state of reasonable health until earlier today when she stepped off a uneven step and fell.  She had immediate pain on her left shoulder left hip.  Patient was brought to ED for further evaluation.  Patient denies hitting her head, being confused.  Patient denies any chest pain or palpitation or shortness of breath.  She notes she was feeling entirely well until she fell this morning.ED Course:  The patient was noted to be afebrile and normotensive.  X-rays revealed left superior and inferior pubic rami fractures as well as a fracture of left proximal humerus.      Consultants:   Ortho  Procedures:   Antimicrobials:       Subjective: Her chaplain visiting.  Patient has no pain.  No other complaints such as shortness of breath or cp.  Objective: Vitals:   09/08/19 1626 09/08/19 2042 09/09/19 0004 09/09/19 0803  BP: (!) 106/47 (!) 116/56 (!) 122/64 (!) 114/47  Pulse: 70 92 78 70  Resp: 16 18 14 17   Temp: (!) 97.5 F (36.4 C) 98.3 F (36.8 C) 98.2 F (36.8 C) 98.2 F (36.8 C)  TempSrc: Oral Oral Oral Oral  SpO2: 94% 96% 100% 98%  Weight:        Intake/Output Summary (Last 24 hours) at 09/09/2019 1511 Last data filed at 09/09/2019 1407 Gross per 24 hour  Intake 240 ml  Output 800 ml  Net -560 ml   Filed Weights   09/06/19 1111  Weight: 44.9 kg    Examination:  General exam: Calm and comfortable, NAD.  Left eye ecchymosis not need Respiratory system: CTA, no wheeze rales rhonchi's placed Cardiovascular system: RRR S1-S2 no murmurs  gastrointestinal system: Soft nontender nondistended positive bowel sounds  Central nervous system: Alert and  oriented x3.  Grossly intact Extremities: left shoulder in a sling, no edema Skin warm and dry Psychiatry: Mood and affect appropriate in current setting     Data Reviewed: I have personally reviewed following labs and imaging studies  CBC: Recent Labs  Lab 09/06/19 1214 09/07/19 0509 09/09/19 0454  WBC 20.8* 10.2 10.3  HGB 12.2 10.1* 8.5*  HCT 37.6 29.6* 25.2*  MCV 96.4 94.3 94.7  PLT 170 146* 893*   Basic Metabolic Panel: Recent Labs  Lab 09/06/19 1214 09/07/19 0509  NA 138 137  K 4.1 4.5  CL 103 101  CO2 24 27  GLUCOSE 110* 135*  BUN 21 26*  CREATININE 0.65 0.80  CALCIUM 9.1 8.7*   GFR: Estimated Creatinine Clearance: 25.8 mL/min (by C-G formula based on SCr of 0.8 mg/dL). Liver Function Tests: Recent Labs  Lab 09/06/19 1214  AST 28  ALT 17  ALKPHOS 60  BILITOT 0.9  PROT 7.1  ALBUMIN 3.6   No results for input(s): LIPASE, AMYLASE in the last 168 hours. No results for input(s): AMMONIA in the last 168 hours. Coagulation Profile: No results for input(s): INR, PROTIME in the last 168 hours. Cardiac Enzymes: No results for input(s): CKTOTAL, CKMB, CKMBINDEX, TROPONINI in the last 168 hours. BNP (last 3 results) No results for input(s): PROBNP in the last 8760 hours. HbA1C: No results for input(s): HGBA1C in the last 72  hours. CBG: No results for input(s): GLUCAP in the last 168 hours. Lipid Profile: No results for input(s): CHOL, HDL, LDLCALC, TRIG, CHOLHDL, LDLDIRECT in the last 72 hours. Thyroid Function Tests: No results for input(s): TSH, T4TOTAL, FREET4, T3FREE, THYROIDAB in the last 72 hours. Anemia Panel: No results for input(s): VITAMINB12, FOLATE, FERRITIN, TIBC, IRON, RETICCTPCT in the last 72 hours. Sepsis Labs: No results for input(s): PROCALCITON, LATICACIDVEN in the last 168 hours.  Recent Results (from the past 240 hour(s))  SARS Coronavirus 2 by RT PCR (hospital order, performed in [facility] hospital lab) Nasopharyngeal  Nasopharyngeal Swab     Status: None   Collection Time: 09/06/19 12:14 PM   Specimen: Nasopharyngeal Swab  Result Value Ref Range Status   SARS Coronavirus 2 NEGATIVE NEGATIVE Final    Comment: (NOTE) SARS-CoV-2 target nucleic acids are NOT DETECTED.  The SARS-CoV-2 RNA is generally detectable in upper and lower respiratory specimens during the acute phase of infection. The lowest concentration of SARS-CoV-2 viral copies this assay can detect is 250 copies / mL. A negative result does not preclude SARS-CoV-2 infection and should not be used as the sole basis for treatment or other patient management decisions.  A negative result may occur with improper specimen collection / handling, submission of specimen other than nasopharyngeal swab, presence of viral mutation(s) within the areas targeted by this assay, and inadequate number of viral copies (<250 copies / mL). A negative result must be combined with clinical observations, patient history, and epidemiological information.  Fact Sheet for Patients:   StrictlyIdeas.no  Fact Sheet for Healthcare Providers: BankingDealers.co.za  This test is not yet approved or  cleared by the [place] FDA and has been authorized for detection and/or diagnosis of SARS-CoV-2 by FDA under an Emergency Use Authorization (EUA).  This EUA will remain in effect (meaning this test can be used) for the duration of the COVID-19 declaration under Section 564(b)(1) of the Act, 21 U.S.C. section 360bbb-3(b)(1), unless the authorization is terminated or revoked sooner.  Performed at [facility], [address], [place], [place] [address]          Radiology Studies: No results found.      Scheduled Meds: . aspirin EC  81 mg Oral Daily  . enoxaparin (LOVENOX) injection  30 mg Subcutaneous Q24H  . furosemide  40 mg Oral Daily  . lidocaine  1 patch Transdermal Q24H  . melatonin  5 mg  Oral QHS  . metoprolol succinate  25 mg Oral Q2000  . traMADol  50 mg Oral Q6H   Continuous Infusions:   Assessment & Plan:   Principal Problem:   Closed fracture of multiple pubic rami, left, initial encounter ([place]) Active Problems:   Rectal cancer, pT2uN0(pNX) s/p TEM partial proctectomy   CAD (coronary artery disease)   HTN (hypertension)   PAF (paroxysmal atrial fibrillation) (HCC)   Depression with anxiety   Bilateral pubic rami fracture Pain controlled Add lidocaine patch Patient notes she gets nausea to almost all pain medications including hydrocodone and oxycodone Continue tramadol 50 mg every 6 with as needed Zofran Ortho is input appreciated-patient may weight-bear as tolerated on the left lower extremity.  Continue sling for left upper extremity Use hemiwalker or cane to avoid weightbearing on the left shoulder No surgical intervention at this time PT/OT-recommend SNF-authorization pending Needs to follow-up with Dr. Mack Guise in 2 weeks for reevaluation    Proximal humerus fracture Continue with left shoulder sling Pain management lidocaine patch e  Atrial fibrillation Continue aspirin and beta-blockers   History of rectal cancer with colostomy Continue MiraLAX as needed     DVT prophylaxis: Lovenox Code Status: DNR Family Communication: None at bedside Disposition Plan: SNF Status is: Inpatient  Remains inpatient appropriate because:Unsafe d/c plan   Dispo: The patient is from: Home              Anticipated d/c is to: SNF              Anticipated d/c date is: 1 day              Patient currently is not medically stable to d/c.SNF pending. Awaiting authorization            LOS: 3 days   Time spent: 35 minutes with more than 50% COC    Nolberto Hanlon, MD [facility] Pager [phone]  If 7PM-7AM, please contact night-coverage [email or web address] Password [email or web address] 09/09/2019, 3:11 PM

## 2019-09-09 NOTE — TOC Progression Note (Signed)
Transition of Care [facility]) - Progression Note    Patient Details  Name: [name] MRN: [id number] Date of Birth: 1916/05/08  Transition of Care [facility]) CM/SW [place], RN Phone Number: 09/09/2019, 12:18 PM  Clinical Narrative:   reviewed the bed offers with the patient, she accepted the bed offer from [facility] , she has been fully vaccinated, faxed clinical for insurance to [employer]   Expected Discharge Plan: [place] Barriers to Discharge: Continued Medical Work up  Expected Discharge Plan and Services Expected Discharge Plan: [place] arrangements for the past 2 months: Single Family Home                                       Social Determinants of Health (SDOH) Interventions    Readmission Risk Interventions No flowsheet data found.

## 2019-09-10 DIAGNOSIS — S32592A Other specified fracture of left pubis, initial encounter for closed fracture: Secondary | ICD-10-CM | POA: Diagnosis not present

## 2019-09-10 DIAGNOSIS — I480 Paroxysmal atrial fibrillation: Secondary | ICD-10-CM

## 2019-09-10 DIAGNOSIS — D696 Thrombocytopenia, unspecified: Secondary | ICD-10-CM

## 2019-09-10 LAB — SARS CORONAVIRUS 2 BY RT PCR (HOSPITAL ORDER, PERFORMED IN ~~LOC~~ HOSPITAL LAB): SARS Coronavirus 2: NEGATIVE

## 2019-09-10 MED ORDER — LORAZEPAM 2 MG/ML IJ SOLN
0.5000 mg | Freq: Once | INTRAMUSCULAR | Status: AC
  Administered 2019-09-10: 0.5 mg via INTRAVENOUS
  Filled 2019-09-10: qty 1

## 2019-09-10 MED ORDER — PHENOL 1.4 % MT LIQD
1.0000 | OROMUCOSAL | Status: DC | PRN
  Filled 2019-09-10 (×2): qty 177

## 2019-09-10 MED ORDER — MAGIC MOUTHWASH
10.0000 mL | Freq: Three times a day (TID) | ORAL | Status: DC | PRN
  Filled 2019-09-10 (×3): qty 10

## 2019-09-10 NOTE — Progress Notes (Signed)
Physical Therapy Treatment Patient Details Name: [name] MRN: [id number] DOB: [birth year] Today's Date: 09/10/2019    History of Present Illness Pt is an 86 y.o. female with PMH significant for HTN, STEMI, stent placement, atrial fibrillation/flutter on aspirin 81 mg daily, BPH/dizziness, rectal cancer (w/ colostomy), and gait instability. Pt presents today s/p bilateral pubic rami fx and proximal L humerus fx after stepping off uneven step and falling.    PT Comments    Pt appeared to have significantly impaired cognition during today's session, compared to observations during PT session 7/26. Pt decreased orientation and alertness at beginning of session; however, alertness improved over the course of treatment. Pt only oriented to self this a.m., very confused as to where she is and why she is here. Pt continued to repeat that she wanted to go home, but that she did not want to move, and did not understand why they couldn't just take her home like this (in bed). Pt confused and rambling about various things throughout session. Pt expressed significant pain this a.m. (5/10 pain initially; up to 10/10) and requested tylenol during session. Nursing notified and pt received tylenol during session. Pt demonstrated very minimal ability to participate in bed mobility today (compared to supervision assist 7/26) and required +2 mod-max assist supine-sit and sit-supine. Pt +1 HHA and +2 mod assist for sit-stand and for standing with poor LE positioning and R lateral lean. Briefly corrected and able to stand with relatively good posture ~2sec with cueing and continued +1 HHA and mod assist. Prolonged sitting at EOB was performed with SBA for work on sitting balance and endurance; no LOB noted. Overall, pt participation in today's session appeared limited by change in cognitive status. MD and nursing notified of observed changes. Pt will benefit from PT services in a SNF setting upon discharge to safely  address deficits listed in patient problem list for decreased caregiver assistance and eventual return to PLOF.   Follow Up Recommendations  SNF     Equipment Recommendations  None recommended by PT    Recommendations for Other Services       Precautions / Restrictions Precautions Precautions: Fall Required Braces or Orthoses: Sling (LUE) Restrictions Weight Bearing Restrictions: Yes LUE Weight Bearing: Non weight bearing LLE Weight Bearing: Weight bearing as tolerated Other Position/Activity Restrictions: per MD note: pt can bear weight through LUE w/ RW if necessary but inc risk of fx displacement. Should attempt hemiwalker    Mobility  Bed Mobility Overal bed mobility: Needs Assistance Bed Mobility: Supine to Sit;Sit to Supine     Supine to sit: +2 for physical assistance;Mod assist;HOB elevated Sit to supine: Max assist;+2 for physical assistance   General bed mobility comments: pt demonstrated minimal ability to participate in bed mobility today with need for +2 mod-max assist to move BLE and trunk (of note, supervision supine-to-sit 7/26). Pt unable to follow cues or and and unable to provide any of the inital movement to get to EOB.  Transfers Overall transfer level: Needs assistance Equipment used: 1 person hand held assist Transfers: Sit to/from Stand Sit to Stand: Mod assist;+2 physical assistance         General transfer comment: Poor LE positioning with feet nearly sliding out from under her going into standing; feet remaining anterior with cueing. Signifiant anterior and R lateral trunk lean with brief ~2sec ability to stand up straight with cueing. Pt consistently reached for therapist in standing - completely leaning on RUE and reaching with LUE.  Ambulation/Gait             General Gait Details: attempt to lift BLE; unable to advance either limb with pt reporting "my feet just feel stuck to the ground"   Stairs             Wheelchair  Mobility    Modified Rankin (Stroke Patients Only)       Balance Overall balance assessment: Needs assistance Sitting-balance support: No upper extremity supported;Feet supported Sitting balance-Leahy Scale: Fair Sitting balance - Comments: SBA + RUE support on bed - fairly strong R lateral lean at times likely 2/2 left sided pain Postural control: Right lateral lean Standing balance support: Single extremity supported Standing balance-Leahy Scale: Poor Standing balance comment: heavy R lateral lean/push, limited ability to self-correct                            Cognition Arousal/Alertness: Lethargic Behavior During Therapy: Agitated Overall Cognitive Status: Impaired/Different from baseline Area of Impairment: Orientation;Memory                 Orientation Level: Disoriented to;Place;Time;Situation   Memory: Decreased short-term memory         General Comments: Per 7/26 session pt was A&Ox4, excellent historian and provided details of day-to-day life.  Pt's cognition today, 7/28, significantly impaired compared to status on 7/26. Pt dec orientation and alertness at beginning of today's session, with alertness improving over the course of the session. Pt now disoriented to place, time, and situation this a.m. as well as decreased short-term memory. Pt asked multiple times where she is and why she is here and that she wants to go home. Pt confused and rambling various things throughout session, continued to apologize for sins and ask for forgiveness, quoting bible/singing worship songs.      Exercises Total Joint Exercises Ankle Circles/Pumps: AROM;Both;10 reps Hip ABduction/ADduction: AAROM;Both;10 reps;Strengthening Straight Leg Raises: AAROM;Strengthening;Both;10 reps Long Arc Quad: AROM;Strengthening;Both;10 reps;15 reps Marching in Standing: Other (comment) (attempted 7/28 - pt unable to lift BLE) Other Exercises Other Exercises: static sitting for  improvement for balance and overall endurance    General Comments General comments (skin integrity, edema, etc.): nasal cannula running on 1L but off and beside pt at beginning of session. Pt SpO2 90% at baseline; nasal cannula placed back on. SpO2 returned to mid-90s      Pertinent Vitals/Pain Pain Assessment: 0-10 Pain Score: 5  Pain Location: L hip, L shoulder Pain Descriptors / Indicators: Grimacing;Sore;Aching Pain Intervention(s): Limited activity within patient's tolerance;Monitored during session;Patient requesting pain meds-RN notified;RN gave pain meds during session;Repositioned    Home Living                      Prior Function            PT Goals (current goals can now be found in the care plan section) Progress towards PT goals: Not progressing toward goals - comment (significant agitation / cognitive difficulties today limiting pt's function and ability to participate)    Frequency    7X/week      PT Plan Current plan remains appropriate    Co-evaluation              AM-PAC PT "6 Clicks" Mobility   Outcome Measure  Help needed turning from your back to your side while in a flat bed without using bedrails?: A Lot Help needed moving from lying on your back  to sitting on the side of a flat bed without using bedrails?: A Lot Help needed moving to and from a bed to a chair (including a wheelchair)?: Total Help needed standing up from a chair using your arms (e.g., wheelchair or bedside chair)?: A Lot Help needed to walk in hospital room?: Total Help needed climbing 3-5 steps with a railing? : Total 6 Click Score: 9    End of Session Equipment Utilized During Treatment: Gait belt;Oxygen Activity Tolerance: Patient limited by lethargy;Patient limited by pain;Treatment limited secondary to agitation Patient left: in bed;with bed alarm set;with call bell/phone within reach Nurse Communication: Mobility status;Weight bearing status PT Visit  Diagnosis: Unsteadiness on feet (R26.81);Muscle weakness (generalized) (M62.81);Difficulty in walking, not elsewhere classified (R26.2);Pain Pain - Right/Left: Left Pain - part of body: Shoulder;Hip     Time: 4536-4680 PT Time Calculation (min) (ACUTE ONLY): 39 min  Charges:                        Breeann Reposa SPT 09/10/19, 1:20 PM

## 2019-09-10 NOTE — TOC Progression Note (Signed)
Transition of Care [facility]) - Progression Note    Patient Details  Name: [name] MRN: [id number] Date of Birth: [birth year]  Transition of Care [facility]) CM/SW [place], RN Phone Number: 09/10/2019, 1:08 PM  Clinical Narrative:   [employer] to follow up on auth request, They have not started the auth process but will start now , ref number [id number], fax number [phone]     Expected Discharge Plan: [place] Barriers to Discharge: Continued Medical Work up  Expected Discharge Plan and Services Expected Discharge Plan: [place] arrangements for the past 2 months: Single Family Home                                       Social Determinants of Health (SDOH) Interventions    Readmission Risk Interventions No flowsheet data found.

## 2019-09-10 NOTE — Progress Notes (Signed)
  Subjective:  Patient more confused today.  She states that she was angry at the hospital for sending her to social place that was very small and she felt claustrophobic.  I believe she either dreamed this or had some hallucinations possibly due to pain medication.  Patient demonstrated better insight once it was explained to her that that she was in the hospital and likely her visions were the result of medication.  Patient's friend was at the bedside and agreed.  Patient complained of pain in the left shoulder but not any significant pain in the left hip or lower extremity.  Objective:   VITALS:   Vitals:   09/09/19 2118 09/09/19 2325 09/10/19 0753 09/10/19 1532  BP: (!) 135/49 (!) 157/61 (!) 132/52 (!) 112/40  Pulse: 70 78 76 68  Resp: 18 19 17 17   Temp: 98.1 F (36.7 C) 97.8 F (36.6 C) (!) 97.5 F (36.4 C) 98.3 F (36.8 C)  TempSrc: Oral Oral  Oral  SpO2: 100% 97% 93% 100%  Weight:        PHYSICAL EXAM: Left upper extremity: Skin remains intact.  There is no erythema ecchymosis or significant swelling.  Her compartments are soft and compressible.  She has intact sensation to light touch in all 5 digits in her fingers well-perfused.  She can flex and extend her fingers actively.  Left lower extremity:   Patient has intact skin.  There is no significant swelling.  Distally patient is neurovascular intact.  There is no shortening or external rotation to her left lower extremity.   LABS  No results found for this or any previous visit (from the past 24 hour(s)).  No results found.  Assessment/Plan:     Principal Problem:   Closed fracture of multiple pubic rami, left, initial encounter ([place]) Active Problems:   Rectal cancer, pT2uN0(pNX) s/p TEM partial proctectomy   CAD (coronary artery disease)   HTN (hypertension)   PAF (paroxysmal atrial fibrillation) (HCC)   Depression with anxiety  Should have judicious use of pain medication.  I recommend Tylenol for pain  unless severe.  I believe her confusion is medication related as she is normally alert and oriented.  Patient is awaiting insurance approval for transfer to a skilled nursing facility.  [facility] in approximately 2 weeks for reevaluation and x-ray of her fractures.  She is going to a skilled nursing facility in [place] and may follow-up with a local orthopedic office there if more convenient.   [place] , MD 09/10/2019, 3:47 PM

## 2019-09-10 NOTE — Progress Notes (Addendum)
PROGRESS NOTE    [name]  MWU:[id number] DOB: Jan 23, 1917 DOA: 09/06/2019 PCP: Leonard Downing, MD  Assessment & Plan:   Principal Problem:   Closed fracture of multiple pubic rami, left, initial encounter [facility]) Active Problems:   Rectal cancer, pT2uN0(pNX) s/p TEM partial proctectomy   CAD (coronary artery disease)   HTN (hypertension)   PAF (paroxysmal atrial fibrillation) ([place])   Depression with anxiety   Bilateral pubic rami fracture: no surgical intervention as per ortho surg. Continue  lidocaine patch only as tramadol causes confusion/delirium. PT/OT recs SNF. WBAT on LLE & ideally remain non-weight bearing of LUE & use hemiwalker.  Will follow-up with Dr. Mack Guise in 2 weeks outpatient   Proximal left humerus fracture: continue with left shoulder sling. Continue on  lidocaine patch. Tylenol prn. Narcotics cause confusion/delirium.  No benzos as it likely causes confusion/delirium   Atrial fibrillation: likely PAF. Continue aspirin and metoprolol   History of rectal cancer:  with colostomy. Management per onco as an outpatient   Thrombocytopenia: etiology unclear. Will continue to monitor   Normocytic anemia: no need for a transfusion at this time. Will continue to monitor   DVT prophylaxis: lovenox Code Status: DNR Family Communication:  Disposition Plan: likely d/c to SNF   Status is: Inpatient  Remains inpatient appropriate because:Ongoing active pain requiring inpatient pain management   Dispo: The patient is from: Home              Anticipated d/c is to: SNF              Anticipated d/c date is: 1 day              Patient currently is not medically stable to d/c.      Consultants:   Ortho surg    Procedures:    Antimicrobials:    Status is: Inpatient  Remains inpatient appropriate because:Ongoing active pain requiring inpatient pain management   Dispo: The patient is from: Home              Anticipated d/c is to: SNF               Anticipated d/c date is: 2 days              Patient currently is not medically stable to d/c.     Subjective: Pt c/o pelvic pain. Pt is oriented to self, place only.   Objective: Vitals:   09/09/19 0803 09/09/19 2118 09/09/19 2325 09/10/19 0753  BP: (!) 114/47 (!) 135/49 (!) 157/61 (!) 132/52  Pulse: 70 70 78 76  Resp: 17 18 19 17   Temp: 98.2 F (36.8 C) 98.1 F (36.7 C) 97.8 F (36.6 C) (!) 97.5 F (36.4 C)  TempSrc: Oral Oral Oral   SpO2: 98% 100% 97% 93%  Weight:        Intake/Output Summary (Last 24 hours) at 09/10/2019 0858 Last data filed at 09/10/2019 0500 Gross per 24 hour  Intake 360 ml  Output 1215 ml  Net -855 ml   Filed Weights   09/06/19 1111  Weight: 44.9 kg    Examination:  General exam: Appears calm but uncomfortable  Respiratory system: Clear to auscultation. No rales Cardiovascular system: S1 & S2+. No  rubs, gallops or clicks. No pedal edema. Gastrointestinal system: Abdomen is nondistended, soft and nontender. Hypoactive bowel sounds heard. Central nervous system: Alert and oriented to person & place only. Moves all 4 extremities  Psychiatry: Judgement and insight appear  abnormal.  Anxious mood and affect      Data Reviewed: I have personally reviewed following labs and imaging studies  CBC: Recent Labs  Lab 09/06/19 1214 09/07/19 0509 09/09/19 0454  WBC 20.8* 10.2 10.3  HGB 12.2 10.1* 8.5*  HCT 37.6 29.6* 25.2*  MCV 96.4 94.3 94.7  PLT 170 146* 025*   Basic Metabolic Panel: Recent Labs  Lab 09/06/19 1214 09/07/19 0509  NA 138 137  K 4.1 4.5  CL 103 101  CO2 24 27  GLUCOSE 110* 135*  BUN 21 26*  CREATININE 0.65 0.80  CALCIUM 9.1 8.7*   GFR: Estimated Creatinine Clearance: 25.8 mL/min (by C-G formula based on SCr of 0.8 mg/dL). Liver Function Tests: Recent Labs  Lab 09/06/19 1214  AST 28  ALT 17  ALKPHOS 60  BILITOT 0.9  PROT 7.1  ALBUMIN 3.6   No results for input(s): LIPASE, AMYLASE in the last 168  hours. No results for input(s): AMMONIA in the last 168 hours. Coagulation Profile: No results for input(s): INR, PROTIME in the last 168 hours. Cardiac Enzymes: No results for input(s): CKTOTAL, CKMB, CKMBINDEX, TROPONINI in the last 168 hours. BNP (last 3 results) No results for input(s): PROBNP in the last 8760 hours. HbA1C: No results for input(s): HGBA1C in the last 72 hours. CBG: No results for input(s): GLUCAP in the last 168 hours. Lipid Profile: No results for input(s): CHOL, HDL, LDLCALC, TRIG, CHOLHDL, LDLDIRECT in the last 72 hours. Thyroid Function Tests: No results for input(s): TSH, T4TOTAL, FREET4, T3FREE, THYROIDAB in the last 72 hours. Anemia Panel: No results for input(s): VITAMINB12, FOLATE, FERRITIN, TIBC, IRON, RETICCTPCT in the last 72 hours. Sepsis Labs: No results for input(s): PROCALCITON, LATICACIDVEN in the last 168 hours.  Recent Results (from the past 240 hour(s))  SARS Coronavirus 2 by RT PCR (hospital order, performed in [facility] hospital lab) Nasopharyngeal Nasopharyngeal Swab     Status: None   Collection Time: 09/06/19 12:14 PM   Specimen: Nasopharyngeal Swab  Result Value Ref Range Status   SARS Coronavirus 2 NEGATIVE NEGATIVE Final    Comment: (NOTE) SARS-CoV-2 target nucleic acids are NOT DETECTED.  The SARS-CoV-2 RNA is generally detectable in upper and lower respiratory specimens during the acute phase of infection. The lowest concentration of SARS-CoV-2 viral copies this assay can detect is 250 copies / mL. A negative result does not preclude SARS-CoV-2 infection and should not be used as the sole basis for treatment or other patient management decisions.  A negative result may occur with improper specimen collection / handling, submission of specimen other than nasopharyngeal swab, presence of viral mutation(s) within the areas targeted by this assay, and inadequate number of viral copies (<250 copies / mL). A negative result must be  combined with clinical observations, patient history, and epidemiological information.  Fact Sheet for Patients:   StrictlyIdeas.no  Fact Sheet for Healthcare Providers: BankingDealers.co.za  This test is not yet approved or  cleared by the [place] FDA and has been authorized for detection and/or diagnosis of SARS-CoV-2 by FDA under an Emergency Use Authorization (EUA).  This EUA will remain in effect (meaning this test can be used) for the duration of the COVID-19 declaration under Section 564(b)(1) of the Act, 21 U.S.C. section 360bbb-3(b)(1), unless the authorization is terminated or revoked sooner.  Performed at [facility], [address] [place], [place] [address]          Radiology Studies: No results found.  Scheduled Meds: . aspirin EC  81 mg Oral Daily  . enoxaparin (LOVENOX) injection  30 mg Subcutaneous Q24H  . furosemide  40 mg Oral Daily  . lidocaine  1 patch Transdermal Q24H  . melatonin  5 mg Oral QHS  . metoprolol succinate  25 mg Oral Q2000  . traMADol  50 mg Oral Q6H   Continuous Infusions:   LOS: 4 days    Time spent: 34 mins    Wyvonnia Dusky, MD [facility] Pager [phone]  If 7PM-7AM, please contact night-coverage [email or web address] 09/10/2019, 8:58 AM

## 2019-09-10 NOTE — Progress Notes (Signed)
Pt confused and oriented to self and year. Per night nurse pt became confused when previously was alert and oriented x4. RN notified Dr. Jimmye Norman. Dr. Gwyndolyn Saxon in pt room now.

## 2019-09-10 NOTE — Plan of Care (Signed)
  Problem: Education: Goal: Knowledge of General Education information will improve Description: Including pain rating scale, medication(s)/side effects and non-pharmacologic comfort measures Outcome: Progressing   Problem: Health Behavior/Discharge Planning: Goal: Ability to manage health-related needs will improve Outcome: Progressing   Problem: Clinical Measurements: Goal: Ability to maintain clinical measurements within normal limits will improve Outcome: Progressing Goal: Will remain free from infection Outcome: Progressing Goal: Diagnostic test results will improve Outcome: Progressing Goal: Respiratory complications will improve Outcome: Progressing Goal: Cardiovascular complication will be avoided Outcome: Progressing   Problem: Activity: Goal: Risk for activity intolerance will decrease Outcome: Progressing   Problem: Nutrition: Goal: Adequate nutrition will be maintained Outcome: Progressing   Problem: Coping: Goal: Level of anxiety will decrease Outcome: Progressing   Problem: Elimination: Goal: Will not experience complications related to bowel motility Outcome: Progressing Goal: Will not experience complications related to urinary retention Outcome: Progressing   Problem: Pain Managment: Goal: General experience of comfort will improve Outcome: Progressing   Problem: Safety: Goal: Ability to remain free from injury will improve Outcome: Progressing   Problem: Skin Integrity: Goal: Risk for impaired skin integrity will decrease Outcome: Progressing   Problem: Clinical Measurements: Goal: Postoperative complications will be avoided or minimized Outcome: Progressing   Problem: Pain Management: Goal: Pain level will decrease Outcome: Progressing

## 2019-09-11 DIAGNOSIS — S32592A Other specified fracture of left pubis, initial encounter for closed fracture: Secondary | ICD-10-CM | POA: Diagnosis not present

## 2019-09-11 DIAGNOSIS — I480 Paroxysmal atrial fibrillation: Secondary | ICD-10-CM | POA: Diagnosis not present

## 2019-09-11 DIAGNOSIS — D649 Anemia, unspecified: Secondary | ICD-10-CM | POA: Diagnosis not present

## 2019-09-11 LAB — CBC
HCT: 28.2 % — ABNORMAL LOW (ref 36.0–46.0)
Hemoglobin: 9.4 g/dL — ABNORMAL LOW (ref 12.0–15.0)
MCH: 31.2 pg (ref 26.0–34.0)
MCHC: 33.3 g/dL (ref 30.0–36.0)
MCV: 93.7 fL (ref 80.0–100.0)
Platelets: 235 10*3/uL (ref 150–400)
RBC: 3.01 MIL/uL — ABNORMAL LOW (ref 3.87–5.11)
RDW: 12.3 % (ref 11.5–15.5)
WBC: 11.1 10*3/uL — ABNORMAL HIGH (ref 4.0–10.5)
nRBC: 0 % (ref 0.0–0.2)

## 2019-09-11 LAB — BASIC METABOLIC PANEL
Anion gap: 10 (ref 5–15)
BUN: 24 mg/dL — ABNORMAL HIGH (ref 8–23)
CO2: 27 mmol/L (ref 22–32)
Calcium: 8.9 mg/dL (ref 8.9–10.3)
Chloride: 97 mmol/L — ABNORMAL LOW (ref 98–111)
Creatinine, Ser: 0.81 mg/dL (ref 0.44–1.00)
GFR calc Af Amer: >60 mL/min (ref >60)
GFR calc non Af Amer: 59 mL/min — ABNORMAL LOW (ref >60)
Glucose, Bld: 108 mg/dL — ABNORMAL HIGH (ref 70–99)
Potassium: 4.4 mmol/L (ref 3.5–5.1)
Sodium: 134 mmol/L — ABNORMAL LOW (ref 135–145)

## 2019-09-11 MED ORDER — LIDOCAINE 5 % EX PTCH: 1.0000 | MEDICATED_PATCH | CUTANEOUS | 0 refills | Status: AC

## 2019-09-11 MED ORDER — ENOXAPARIN SODIUM 30 MG/0.3ML ~~LOC~~ SOLN: 30.0000 mg | SUBCUTANEOUS | Status: DC

## 2019-09-11 NOTE — TOC Progression Note (Signed)
Transition of Care [facility]) - Progression Note    Patient Details  Name: [name] MRN: [id number] Date of Birth: 23-Jan-1917  Transition of [facility]) CM/SW [place], RN Phone Number: 09/11/2019, 1:23 PM  Clinical Narrative:   The bedside nurse called report to [facility], the patient is discharging to bed 304B, Toc called EMS, they stated that they are busy it may be a while, I attempted to contact her Niece, no answer, The nurse made aware    Expected Discharge Plan: Skilled Nursing Facility Barriers to Discharge: Barriers Resolved  Expected Discharge Plan and Services Expected Discharge Plan: [place] arrangements for the past 2 months: Single Family Home Expected Discharge Date: 09/11/19                                     Social Determinants of Health (SDOH) Interventions    Readmission Risk Interventions No flowsheet data found.

## 2019-09-11 NOTE — Progress Notes (Signed)
PT report called to Lolita Rieger at this time.  CM notified pt ready for dc and will call EMS

## 2019-09-11 NOTE — Discharge Summary (Signed)
Physician Discharge Summary  [name] YNW:[id number] DOB: 02/04/1917 DOA: 09/06/2019  PCP: Leonard Downing, MD  Admit date: 09/06/2019 Discharge date: 09/11/2019  Admitted From: home Disposition:  SNF  Recommendations for Outpatient Follow-up:  1. Follow up with PCP in 1-2 weeks 2. F/u w/ ortho surg in 2 weeks  Home Health: no  Equipment/Devices:  Discharge Condition: stable CODE STATUS: DNR Diet recommendation: Heart Healthy  Brief/Interim Summary: HPI was taken from Dr. Jamse Arn: [name] is an 86 y.o. female who lives independently with PMH significant for HTN, atrial fibrillation/flutter on aspirin 81 mg daily, BPH/dizziness, gait instability who was in her usual state of reasonable health until earlier today when she stepped off a uneven step and fell.  She had immediate pain on her left shoulder left hip.  Patient was brought to ED for further evaluation.  Patient denies hitting her head, being confused.  Patient denies any chest pain or palpitation or shortness of breath.  She notes she was feeling entirely well until she fell earlier this morning.  She has not been unwell.  ED Course:  The patient was noted to be afebrile and normotensive.  X-rays revealed left superior and inferior pubic rami fractures as well as a fracture of left proximal humerus.  Patient is admitted for pain control and PT OT.  Hospital Course from Dr. Lenise Herald 7/28-7/29/21: Pt presented after a fall and was found to have b/l pubic rami fracture & proximal left humerus fracture. No surgical intervention was indicated as per ortho surg. Pt will f/u w/ ortho surg in 2 weeks outpatient. Also, pt should be WBAT on LLE & ideally remain non-weight bearing of LUE & use hemiwalker. PT/OT saw the pt and recommended SNF. For more information, please see previous progress notes.   Discharge Diagnoses:  Principal Problem:   Closed fracture of multiple pubic rami, left, initial encounter  ([place]) Active Problems:   Rectal cancer, pT2uN0(pNX) s/p TEM partial proctectomy   CAD (coronary artery disease)   HTN (hypertension)   PAF (paroxysmal atrial fibrillation) (HCC)   Depression with anxiety  Bilateral pubic rami fracture: no surgical intervention as per ortho surg. Continue  lidocaine patch only as narcotics causes confusion/delirium. PT/OT recs SNF.  Weight bearing as tolerated on LLE & ideally remain non-weight bearing of LUE & use hemiwalker.  Will follow-up with Dr. Mack Guise, ortho surg, in 2 weeks outpatient  Proximal left humerus fracture: continue with left shoulder sling. Continue on  lidocaine patch. Tylenol prn. Narcotics cause confusion/delirium.  No benzos as it likely causes confusion/delirium   Leukocytosis: mild. Likely reactive. Will continue to monitor   Atrial fibrillation: likely PAF. Continue aspirin and metoprolol   History of rectal cancer:  with colostomy. Management per oncology as an outpatient   Thrombocytopenia: resolved  Normocytic anemia: H&H are trending up today. No need for a transfusion currently. Will continue to monitor   Discharge Instructions  Discharge Instructions    Diet - low sodium heart healthy   Complete by: As directed    Discharge instructions   Complete by: As directed    F/u ortho surg, Dr. Mack Guise, in 2 weeks. F/u PCP in 1-2 weeks   Increase activity slowly   Complete by: As directed      Allergies as of 09/11/2019      Reactions   Effexor [venlafaxine Hydrochloride] Other (See Comments)   Reaction not recalled by the patient   Penicillins Shortness Of Breath   Has patient had  a PCN reaction causing immediate rash, facial/tongue/throat swelling, SOB or lightheadedness with hypotension: Yes Has patient had a PCN reaction causing severe rash involving mucus membranes or skin necrosis: No Has patient had a PCN reaction that required hospitalization: No Has patient had a PCN reaction occurring within the last 10  years: No If all of the above answers are "NO", then may proceed with Cephalosporin use.   Sulfa Antibiotics Nausea Only   Dicyclomine Other (See Comments)   Caused confusion   Lactose Intolerance (gi) Diarrhea   Hydrocodone Nausea Only      Medication List    TAKE these medications   acetaminophen 500 MG tablet Commonly known as: TYLENOL Take 2 tablets (1,000 mg total) by mouth every 6 (six) hours as needed for mild pain.   aspirin EC 81 MG tablet Take 81 mg by mouth daily.   beta carotene w/minerals tablet Take 1 tablet by mouth daily.   BIOTIN PO Take 1 tablet by mouth daily.   cholecalciferol 1000 units tablet Commonly known as: VITAMIN D Take 1,000 Units by mouth daily.   enoxaparin 30 MG/0.3ML injection Commonly known as: LOVENOX Inject 0.3 mLs (30 mg total) into the skin daily for 14 days. Start taking on: September 12, 2019   furosemide 40 MG tablet Commonly known as: LASIX Take 40 mg by mouth daily.   lidocaine 5 % Commonly known as: LIDODERM Place 1 patch onto the skin daily for 3 days. Remove & Discard patch within 12 hours or as directed by MD   melatonin 5 MG Tabs Take 5 mg by mouth at bedtime.   metoprolol succinate 25 MG 24 hr tablet Commonly known as: Toprol XL Take 1 tablet (25 mg total) by mouth daily.   nitroGLYCERIN 0.4 MG SL tablet Commonly known as: NITROSTAT Place 0.4 mg under the tongue every 5 (five) minutes as needed for chest pain.   ondansetron 8 MG tablet Commonly known as: ZOFRAN Take 8 mg by mouth every 8 (eight) hours as needed.   Systane 0.4-0.3 % Gel ophthalmic gel Generic drug: Polyethyl Glycol-Propyl Glycol Place 1 drop into both eyes at bedtime.       Contact information for after-discharge care    Destination    [facility] Preferred SNF.   Service: Skilled Nursing Contact information: [place] [phone]                 Allergies   Allergen Reactions  . Effexor [Venlafaxine Hydrochloride] Other (See Comments)    Reaction not recalled by the patient  . Penicillins Shortness Of Breath    Has patient had a PCN reaction causing immediate rash, facial/tongue/throat swelling, SOB or lightheadedness with hypotension: Yes Has patient had a PCN reaction causing severe rash involving mucus membranes or skin necrosis: No Has patient had a PCN reaction that required hospitalization: No Has patient had a PCN reaction occurring within the last 10 years: No If all of the above answers are "NO", then may proceed with Cephalosporin use.   . Sulfa Antibiotics Nausea Only  . Dicyclomine Other (See Comments)    Caused confusion  . Lactose Intolerance (Gi) Diarrhea  . Hydrocodone Nausea Only    Consultations:  Ortho surgery    Procedures/Studies: DG Chest 1 View  Result Date: 09/06/2019 CLINICAL DATA:  Fall today.  Left shoulder pain. EXAM: CHEST  1 VIEW COMPARISON:  Radiograph 07/19/2019 and 04/20/2019. FINDINGS: 1136 hours. The heart size and mediastinal contours  are stable. There is aortic atherosclerosis and prominent calcification of the mitral annulus. The lungs appear clear. There is no pleural effusion or pneumothorax. No acute osseous findings are seen. There are degenerative changes within the spine. IMPRESSION: No evidence of acute chest injury or active cardiopulmonary process. Electronically Signed   By: Richardean Sale M.D.   On: 09/06/2019 12:31   CT Head Wo Contrast  Result Date: 09/06/2019 CLINICAL DATA:  [place] while walking with her cane striking LEFT side, blood at LEFT eye, LEFT shoulder pain, LEFT hip pain EXAM: CT HEAD WITHOUT CONTRAST TECHNIQUE: Contiguous axial images were obtained from the base of the skull through the vertex without intravenous contrast. Sagittal and coronal MPR images reconstructed from axial data set. COMPARISON:  02/22/2017 FINDINGS: Brain: Generalized atrophy. Normal ventricular  morphology. No midline shift or mass effect. Small vessel chronic ischemic changes of deep cerebral white matter. No intracranial hemorrhage, mass lesion, evidence of acute infarction, or extra-axial fluid collection. Vascular: Atherosclerotic calcification of internal carotid arteries bilaterally at skull base Skull: Demineralized but intact Sinuses/Orbits: Clear Other: N/A IMPRESSION: Atrophy with small vessel chronic ischemic changes of deep cerebral white matter. No acute intracranial abnormalities. Electronically Signed   By: Lavonia Dana M.D.   On: 09/06/2019 11:54   DG Shoulder Left  Result Date: 09/06/2019 CLINICAL DATA:  Left shoulder pain post fall. EXAM: LEFT SHOULDER - 2+ VIEW COMPARISON:  11/24/2013 FINDINGS: Minimally displaced fracture of the left humeral neck with extension to the region of the greater tuberosity. No evidence of dislocation. Mild degenerate change of the [facility] joint and glenohumeral joints. IMPRESSION: Minimally displaced humeral neck fracture extending to the region of the greater tuberosity. Electronically Signed   By: Marin Olp M.D.   On: 09/06/2019 12:25   DG HIP UNILAT WITH PELVIS 2-3 VIEWS LEFT  Result Date: 09/06/2019 CLINICAL DATA:  Fall with left hip pain. EXAM: DG HIP (WITH OR WITHOUT PELVIS) 2-3V LEFT COMPARISON:  11/24/2013 FINDINGS: Exam demonstrates moderate diffuse osteopenia. There is a displaced acute fracture involving the left superior pubic ramus as well as separate fracture along the left side of the junction of the superior pubic ramus to the symphysis. Minimally displaced acute fracture of the left inferior pubic ramus. Possible subtle fracture along the right side of the symphysis. Right hip arthroplasty intact. Degenerative change of the spine. No evidence of left hip fracture. IMPRESSION: Acute displaced fractures involving the left superior pubic ramus as well as junction of the left superior pubic ramus to the symphysis. Fracture of the left  inferior pubic ramus. Possible subtle nondisplaced fracture of the right side of the symphysis. Electronically Signed   By: Marin Olp M.D.   On: 09/06/2019 12:29       Subjective: Pt c/o dry feet    Discharge Exam: Vitals:   09/11/19 0027 09/11/19 0031  BP: (!) 154/76   Pulse: 105 82  Resp: 18   Temp: 98.8 F (37.1 C)   SpO2: 90% 95%   Vitals:   09/10/19 1532 09/10/19 2024 09/11/19 0027 09/11/19 0031  BP: (!) 112/40 116/78 (!) 154/76   Pulse: 68 105 105 82  Resp: 17 20 18    Temp: 98.3 F (36.8 C) 98.5 F (36.9 C) 98.8 F (37.1 C)   TempSrc: Oral Oral Oral   SpO2: 100% 92% 90% 95%  Weight:        General: Pt is alert, awake, not in acute distress Cardiovascular: S1/S2 +, no rubs, no gallops Respiratory: CTA  bilaterally, no wheezing, no rales Abdominal: Soft, NT, ND, bowel sounds + Extremities: no edema, no cyanosis    The results of significant diagnostics from this hospitalization (including imaging, microbiology, ancillary and laboratory) are listed below for reference.     Microbiology: Recent Results (from the past 240 hour(s))  SARS Coronavirus 2 by RT PCR (hospital order, performed in [facility] hospital lab) Nasopharyngeal Nasopharyngeal Swab     Status: None   Collection Time: 09/06/19 12:14 PM   Specimen: Nasopharyngeal Swab  Result Value Ref Range Status   SARS Coronavirus 2 NEGATIVE NEGATIVE Final    Comment: (NOTE) SARS-CoV-2 target nucleic acids are NOT DETECTED.  The SARS-CoV-2 RNA is generally detectable in upper and lower respiratory specimens during the acute phase of infection. The lowest concentration of SARS-CoV-2 viral copies this assay can detect is 250 copies / mL. A negative result does not preclude SARS-CoV-2 infection and should not be used as the sole basis for treatment or other patient management decisions.  A negative result may occur with improper specimen collection / handling, submission of specimen other than  nasopharyngeal swab, presence of viral mutation(s) within the areas targeted by this assay, and inadequate number of viral copies (<250 copies / mL). A negative result must be combined with clinical observations, patient history, and epidemiological information.  Fact Sheet for Patients:   StrictlyIdeas.no  Fact Sheet for Healthcare Providers: BankingDealers.co.za  This test is not yet approved or  cleared by the [place] FDA and has been authorized for detection and/or diagnosis of SARS-CoV-2 by FDA under an Emergency Use Authorization (EUA).  This EUA will remain in effect (meaning this test can be used) for the duration of the COVID-19 declaration under Section 564(b)(1) of the Act, 21 U.S.C. section 360bbb-3(b)(1), unless the authorization is terminated or revoked sooner.  Performed at [facility], [address] [place], [place] [address]   SARS Coronavirus 2 by RT PCR (hospital order, performed in [facility] hospital lab) Nasopharyngeal Nasopharyngeal Swab     Status: None   Collection Time: 09/10/19  5:40 PM   Specimen: Nasopharyngeal Swab  Result Value Ref Range Status   SARS Coronavirus 2 NEGATIVE NEGATIVE Final    Comment: (NOTE) SARS-CoV-2 target nucleic acids are NOT DETECTED.  The SARS-CoV-2 RNA is generally detectable in upper and lower respiratory specimens during the acute phase of infection. The lowest concentration of SARS-CoV-2 viral copies this assay can detect is 250 copies / mL. A negative result does not preclude SARS-CoV-2 infection and should not be used as the sole basis for treatment or other patient management decisions.  A negative result may occur with improper specimen collection / handling, submission of specimen other than nasopharyngeal swab, presence of viral mutation(s) within the areas targeted by this assay, and inadequate number of viral copies (<250 copies / mL). A negative  result must be combined with clinical observations, patient history, and epidemiological information.  Fact Sheet for Patients:   StrictlyIdeas.no  Fact Sheet for Healthcare Providers: BankingDealers.co.za  This test is not yet approved or  cleared by the [place] FDA and has been authorized for detection and/or diagnosis of SARS-CoV-2 by FDA under an Emergency Use Authorization (EUA).  This EUA will remain in effect (meaning this test can be used) for the duration of the COVID-19 declaration under Section 564(b)(1) of the Act, 21 U.S.C. section 360bbb-3(b)(1), unless the authorization is terminated or revoked sooner.  Performed at [facility], [address],  [place], [place] [address]      Labs: BNP (last 3 results) Recent Labs    04/20/19 0801  BNP 024.0*   Basic Metabolic Panel: Recent Labs  Lab 09/06/19 1214 09/07/19 0509 09/11/19 0447  NA 138 137 134*  K 4.1 4.5 4.4  CL 103 101 97*  CO2 24 27 27   GLUCOSE 110* 135* 108*  BUN 21 26* 24*  CREATININE 0.65 0.80 0.81  CALCIUM 9.1 8.7* 8.9   Liver Function Tests: Recent Labs  Lab 09/06/19 1214  AST 28  ALT 17  ALKPHOS 60  BILITOT 0.9  PROT 7.1  ALBUMIN 3.6   No results for input(s): LIPASE, AMYLASE in the last 168 hours. No results for input(s): AMMONIA in the last 168 hours. CBC: Recent Labs  Lab 09/06/19 1214 09/07/19 0509 09/09/19 0454 09/11/19 0447  WBC 20.8* 10.2 10.3 11.1*  HGB 12.2 10.1* 8.5* 9.4*  HCT 37.6 29.6* 25.2* 28.2*  MCV 96.4 94.3 94.7 93.7  PLT 170 146* 136* 235   Cardiac Enzymes: No results for input(s): CKTOTAL, CKMB, CKMBINDEX, TROPONINI in the last 168 hours. BNP: Invalid input(s): POCBNP CBG: No results for input(s): GLUCAP in the last 168 hours. D-Dimer No results for input(s): DDIMER in the last 72 hours. Hgb A1c No results for input(s): HGBA1C in the last 72 hours. Lipid Profile No results for  input(s): CHOL, HDL, LDLCALC, TRIG, CHOLHDL, LDLDIRECT in the last 72 hours. Thyroid function studies No results for input(s): TSH, T4TOTAL, T3FREE, THYROIDAB in the last 72 hours.  Invalid input(s): FREET3 Anemia work up No results for input(s): VITAMINB12, FOLATE, FERRITIN, TIBC, IRON, RETICCTPCT in the last 72 hours. Urinalysis    Component Value Date/Time   COLORURINE YELLOW 07/19/2019 2034   APPEARANCEUR HAZY (A) 07/19/2019 2034   LABSPEC 1.016 07/19/2019 2034   PHURINE 5.0 07/19/2019 2034   GLUCOSEU NEGATIVE 07/19/2019 2034   HGBUR NEGATIVE 07/19/2019 2034   BILIRUBINUR NEGATIVE 07/19/2019 2034   KETONESUR NEGATIVE 07/19/2019 2034   PROTEINUR NEGATIVE 07/19/2019 2034   UROBILINOGEN 0.2 10/01/2014 1628   NITRITE NEGATIVE 07/19/2019 2034   LEUKOCYTESUR LARGE (A) 07/19/2019 2034   Sepsis Labs Invalid input(s): PROCALCITONIN,  WBC,  LACTICIDVEN Microbiology Recent Results (from the past 240 hour(s))  SARS Coronavirus 2 by RT PCR (hospital order, performed in [facility] [address] hospital lab) Nasopharyngeal Nasopharyngeal Swab     Status: None   Collection Time: 09/06/19 12:14 PM   Specimen: Nasopharyngeal Swab  Result Value Ref Range Status   SARS Coronavirus 2 NEGATIVE NEGATIVE Final    Comment: (NOTE) SARS-CoV-2 target nucleic acids are NOT DETECTED.  The SARS-CoV-2 RNA is generally detectable in upper and lower respiratory specimens during the acute phase of infection. The lowest concentration of SARS-CoV-2 viral copies this assay can detect is 250 copies / mL. A negative result does not preclude SARS-CoV-2 infection and should not be used as the sole basis for treatment or other patient management decisions.  A negative result may occur with improper specimen collection / handling, submission of specimen other than nasopharyngeal swab, presence of viral mutation(s) within the areas targeted by this assay, and inadequate number of viral copies (<250 copies / mL). A negative  result must be combined with clinical observations, patient history, and epidemiological information.  Fact Sheet for Patients:   StrictlyIdeas.no  Fact Sheet for Healthcare Providers: BankingDealers.co.za  This test is not yet approved or  cleared by the [place] FDA and has been authorized for detection and/or diagnosis of SARS-CoV-2  by FDA under an Emergency Use Authorization (EUA).  This EUA will remain in effect (meaning this test can be used) for the duration of the COVID-19 declaration under Section 564(b)(1) of the Act, 21 U.S.C. section 360bbb-3(b)(1), unless the authorization is terminated or revoked sooner.  Performed at [facility], [address] [place], [place] [address]   SARS Coronavirus 2 by RT PCR (hospital order, performed in [facility] hospital lab) Nasopharyngeal Nasopharyngeal Swab     Status: None   Collection Time: 09/10/19  5:40 PM   Specimen: Nasopharyngeal Swab  Result Value Ref Range Status   SARS Coronavirus 2 NEGATIVE NEGATIVE Final    Comment: (NOTE) SARS-CoV-2 target nucleic acids are NOT DETECTED.  The SARS-CoV-2 RNA is generally detectable in upper and lower respiratory specimens during the acute phase of infection. The lowest concentration of SARS-CoV-2 viral copies this assay can detect is 250 copies / mL. A negative result does not preclude SARS-CoV-2 infection and should not be used as the sole basis for treatment or other patient management decisions.  A negative result may occur with improper specimen collection / handling, submission of specimen other than nasopharyngeal swab, presence of viral mutation(s) within the areas targeted by this assay, and inadequate number of viral copies (<250 copies / mL). A negative result must be combined with clinical observations, patient history, and epidemiological information.  Fact Sheet for Patients:    StrictlyIdeas.no  Fact Sheet for Healthcare Providers: BankingDealers.co.za  This test is not yet approved or  cleared by the [place] FDA and has been authorized for detection and/or diagnosis of SARS-CoV-2 by FDA under an Emergency Use Authorization (EUA).  This EUA will remain in effect (meaning this test can be used) for the duration of the COVID-19 declaration under Section 564(b)(1) of the Act, 21 U.S.C. section 360bbb-3(b)(1), unless the authorization is terminated or revoked sooner.  Performed at [facility], [address], [place], [place] [address]      Time coordinating discharge: Over 30 minutes  SIGNED:   Wyvonnia Dusky, MD  [facility] 09/11/2019, 12:04 PM Pager   If 7PM-7AM, please contact night-coverage [email or web address]

## 2019-09-11 NOTE — Plan of Care (Signed)
°  Problem: Education: Goal: Knowledge of General Education information will improve Description: Including pain rating scale, medication(s)/side effects and non-pharmacologic comfort measures Outcome: Progressing   Problem: Health Behavior/Discharge Planning: Goal: Ability to manage health-related needs will improve Outcome: Progressing   Problem: Clinical Measurements: Goal: Ability to maintain clinical measurements within normal limits will improve Outcome: Progressing Goal: Will remain free from infection Outcome: Progressing Goal: Diagnostic test results will improve Outcome: Progressing Goal: Respiratory complications will improve Outcome: Progressing Goal: Cardiovascular complication will be avoided Outcome: Progressing   Problem: Activity: Goal: Risk for activity intolerance will decrease Outcome: Progressing   Problem: Nutrition: Goal: Adequate nutrition will be maintained Outcome: Progressing   Problem: Coping: Goal: Level of anxiety will decrease Outcome: Progressing   Problem: Elimination: Goal: Will not experience complications related to bowel motility Outcome: Progressing Goal: Will not experience complications related to urinary retention Outcome: Progressing   Problem: Pain Managment: Goal: General experience of comfort will improve Outcome: Progressing   Problem: Safety: Goal: Ability to remain free from injury will improve Outcome: Progressing   Problem: Skin Integrity: Goal: Risk for impaired skin integrity will decrease Outcome: Progressing   Problem: Education: Goal: Verbalization of understanding the information provided (i.e., activity precautions, restrictions, etc) will improve Outcome: Progressing Goal: Individualized Educational Video(s) Outcome: Progressing   Problem: Activity: Goal: Ability to ambulate and perform ADLs will improve Outcome: Progressing   Problem: Clinical Measurements: Goal: Postoperative complications will be  avoided or minimized Outcome: Progressing   Problem: Self-Concept: Goal: Ability to maintain and perform role responsibilities to the fullest extent possible will improve Outcome: Progressing   Problem: Pain Management: Goal: Pain level will decrease Outcome: Progressing  Pt confused this am about place but is reoriented with frequent reminders. Pain treated with prn tylenol for l shoulder discomfort.  Reminded to call for assist

## 2019-09-11 NOTE — Care Management Important Message (Signed)
Important Message  Patient Details  Name: [name] MRN: [id number] Date of Birth: 02-Jun-1916   Medicare Important Message Given:  Yes     Juliann Pulse A Batul [place] 09/11/2019, 12:34 PM

## 2019-09-11 NOTE — Plan of Care (Signed)
Pt discharged and taken to [facility] via EMS- with packet containing AVS and DNR

## 2019-09-11 NOTE — TOC Progression Note (Addendum)
Transition of Care [facility]) - Progression Note    Patient Details  Name: [name] MRN: [id number] Date of Birth: May 02, 1916  Transition of Care [facility]) CM/SW [place], RN Phone Number: 09/11/2019, 9:21 AM  Clinical Narrative:  Insurance approved 7/29-8/02, Josem Kaufmann id [id number], contacted [facility] and notified them of the approval, DC packet on the chart, Bedside nurse to call report, TOC to call EMS when ready      Expected Discharge Plan: [place] Barriers to Discharge: Continued Medical Work up  Expected Discharge Plan and Services Expected Discharge Plan: [place] arrangements for the past 2 months: Single Family Home                                       Social Determinants of Health (SDOH) Interventions    Readmission Risk Interventions No flowsheet data found.

## 2020-01-25 NOTE — Progress Notes (Signed)
Chief Complaint  Patient presents with  . Follow-up    CAD   History of Present Illness: [name] with history of CAD, aortic valve insufficiency, mitral regurgitation, rectal cancer and atrial flutter who is here today for cardiac follow up. She was admitted to [facility] July 2012 with an inferior STEMI and was found to have a total occlusion of the proximal RCA which was treated with a bare metal stent. There was moderate residual disease in the mid RCA and severe disease in the proximal Circumflex artery. A bare metal stent was placed in the circumflex artery in a staged procedure. Echo March 2016 with normal LV function, moderate AI, mild MR. She is known to have SVT, PVCs and PACs documented by cardiac monitor in 2013. She was found to have atrial flutter in September 2017. Anti-coagulation was not started given advanced age and fall risk. She has been on ASA and a beta blocker. She was admitted with a probable TIA in January 2018 and has recovered neurologically. Echo January 2019 with normal LV systolic function, ZOXW=96-04%. There was grade 3 diastolic dysfunction and mild to moderate aortic valve insufficiency with trivial MR. She was seen in the ED on 05/12/17 with chest pain. Troponin was negative. She had no ischemic EKG changes. She was not admitted. She has had severe fatigue and dyspnea for the past few years.  Some improvement on Lasix. She fell in July 2021 and fractured her pelvis and shoulder. She was in a nursing home for 2 months. Started on amiodarone while in the nursing home.   She is here today for follow up. The patient denies any chest pain, palpitations, lower extremity edema, orthopnea, PND, dizziness, near syncope or syncope. Overall doing well. Continued easy fatigue and dyspnea with minimal exertion.   Primary Care Physician: Leonard Downing, MD  Past Medical History:  Diagnosis Date  . Arthritis    FINGERS   . CAD (coronary artery disease) CARDIOLOGIST-- DR  Angelena Form   a. s/p INF STEMI 7/12: tx with BMS to RCA;  b. cath 08/26/10: pLAD 30%, mLAD 50%, D1 40%, pCFX 95%, mRCA occluded;   c. staged PCI of pCFX with BMS;   d. echo 7/12:   EF 60-65%, mild RAE, mild to moderate AI, mild MR, moderate TR, RVE, PASP 47  . Complication of anesthesia PT STATES "MADE HER FEEL CRAZY"  . Degeneration of eye    left eye cornea  . Dyspnea   . First degree heart block   . Heart palpitations PAC'S AND SVT RUN'S  PER CARDIOLOGIST NOTE  . History of ST elevation myocardial infarction (STEMI) 08-26-2010-- INFERIOR WALL   S/P PCI  BMS IN RCA AND PROX. CX  . Hyperlipidemia   . Hypertension   . Impaired hearing BILATERAL HEARING AIDS  . Osteopenia   . PAF (paroxysmal atrial fibrillation) ([place])   . PONV (postoperative nausea and vomiting)   . Pulmonary nodules BENIGN  PER CT  10-12-2010  . Rectal Cancer 08/2010   adenocarcinoma   S/P PARTIAL PROCTECTOMY (NO CHEMO OR RADIATION)  . 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 S/P coronary artery stent placement 08/2010   X2  BM    Past Surgical History:  Procedure Laterality Date  . ABDOMINAL HYSTERECTOMY  1950's   AND APPENDECTOMY  . CATARACT EXTRACTION W/ INTRAOCULAR LENS  IMPLANT, BILATERAL    . CORONARY ANGIOPLASTY WITH STENT PLACEMENT  08-26-2010  DR Raiford   PCI, BM STENT IN RCA  . CORONARY ANGIOPLASTY WITH STENT PLACEMENT  08-29-2010  DR [place]   PCI, BM STENT IN PROXIMAL CIRCUMFLEX  . EXCISION BENIGN CYST RIGHT BREAST    . FISTULA PLUG N/A 06/21/2012   Procedure: insertion of FISTULA PLUG;  Surgeon: Leighton Ruff, MD;  Location: [facility];  Service: General;  Laterality: N/A;  . FLEXIBLE SIGMOIDOSCOPY N/A 04/02/2012   Procedure: FLEXIBLE SIGMOIDOSCOPY;  Surgeon: Leighton Ruff, MD;  Location: [facility];  Service: Endoscopy;  Laterality: N/A;  . KNEE SURGERY Right 1996  . LAPROSCOPY LYSIS ADHESIONS/  DRAINAGE OF PELVIC ABSCESS/ DIVERTING LOOP SIGMOID COLECTOMY  11-22-2010   POST OP RECTOVAGINAL FISTULA  . PARTIAL PROCTECTOMY BY TEM  11-17-2010   RECTAL CANCER  . RELEASE LEFT CARPAL TUNNEL/ OSTEOTOMY LEFT DISTAL RADIUS  11-10-2009  . STAPEDECTOMY  1970'S  . TONSILLECTOMY  CHILD  . TOTAL HIP ARTHROPLASTY Right 1992  . TRANSTHORACIC ECHOCARDIOGRAM  10-10-2011   NORMAL LV SIZE WITH MILD FOCAL BASAL SEPTAL HYPERTROPHY/ EF 55-60%/ NORMAL RV SIZE AND LVSF/ BIATRIAL ENLARGEMENT/ MILD TO MODERATE AI  &  TR  . TYMPANOPLASTY Left 12-23-2009    Current Outpatient Medications  Medication Sig Dispense Refill  . acetaminophen (TYLENOL) 500 MG tablet Take 2 tablets (1,000 mg total) by mouth every 6 (six) hours as needed for mild pain. 30 tablet 0  . amiodarone (PACERONE) 200 MG tablet     . aspirin EC 81 MG tablet Take 81 mg by mouth daily.    . beta carotene w/minerals (OCUVITE) tablet Take 1 tablet by mouth daily.    Marland Kitchen BIOTIN PO Take 1 tablet by mouth daily.     . Cholecalciferol (VITAMIN D) 50 MCG (2000 UT) tablet Take 2,000 Units by mouth daily.    . furosemide (LASIX) 40 MG tablet Take 40 mg by mouth daily. Per patient taking 1/2 tablet    . melatonin 5 MG TABS Take 5 mg by mouth at bedtime.    . metoprolol succinate (TOPROL XL) 25 MG 24 hr tablet Take 1 tablet (25 mg total) by mouth daily. 30 tablet 6  . nitroGLYCERIN (NITROSTAT) 0.4 MG SL tablet Place 0.4 mg under the tongue every 5 (five) minutes as needed for chest pain.    Vladimir Faster Glycol-Propyl Glycol (SYSTANE) 0.4-0.3 % GEL ophthalmic gel Place 1 drop into both eyes at bedtime.     . sertraline (ZOLOFT) 25 MG tablet     . enoxaparin (LOVENOX) 30 MG/0.3ML injection Inject 0.3 mLs (30 mg total) into the skin daily for 14 days.    Marland Kitchen omeprazole (PRILOSEC) 20 MG capsule Take 1 capsule (20 mg total) by mouth daily. 90 capsule 3   No current facility-administered medications for this visit.    Allergies  Allergen Reactions  . Effexor  [Venlafaxine Hydrochloride] Other (See Comments)    Reaction not recalled by the patient  . Penicillins Shortness Of Breath    Has patient had a PCN reaction causing immediate rash, facial/tongue/throat swelling, SOB or lightheadedness with hypotension: Yes Has patient had a PCN reaction causing severe rash involving mucus membranes or skin necrosis: No Has patient had a PCN reaction that required hospitalization: No Has patient had a PCN reaction occurring within the last 10 years: No If all of the above answers are "NO", then may proceed with Cephalosporin use.   . Sulfa Antibiotics Nausea Only  . Dicyclomine Other (See Comments)    Caused confusion  .  Lactose Intolerance (Gi) Diarrhea  . Hydrocodone Nausea Only    Social History   Socioeconomic History  . Marital status: Widowed    Spouse name: Not on file  . Number of children: Not on file  . Years of education: Not on file  . Highest education level: Not on file  Occupational History  . Not on file  Tobacco Use  . Smoking status: Never Smoker  . Smokeless tobacco: Never Used  Substance and Sexual Activity  . Alcohol use: No  . Drug use: No  . Sexual activity: Not on file  Other Topics Concern  . Not on file  Social History Narrative  . Not on file   Social Determinants of Health   Financial Resource Strain: Not on file  Food Insecurity: Not on file  Transportation Needs: Not on file  Physical Activity: Not on file  Stress: Not on file  Social Connections: Not on file  Intimate Partner Violence: Not on file    Family History  Problem Relation Age of Onset  . Kidney disease Mother   . Heart disease Father   . Cancer Brother     Review of Systems:  As stated in the HPI and otherwise negative.   BP 120/70   Pulse (!) 51   Ht 5\' 3"  (1.6 m)   Wt 99 lb 3.2 oz (45 kg)   SpO2 97%   BMI 17.57 kg/m   Physical Examination:  General: Well developed, well nourished, NAD  HEENT: OP clear, mucus membranes  moist  SKIN: warm, dry. No rashes. Neuro: No focal deficits  Musculoskeletal: Muscle strength 5/5 all ext  Psychiatric: Mood and affect normal  Neck: No JVD, no carotid bruits, no thyromegaly, no lymphadenopathy.  Lungs:Clear bilaterally, no wheezes, rhonci, crackles Cardiovascular: Regular rate and rhythm. Soft murmur.  Abdomen:Soft. Bowel sounds present. Non-tender.  Extremities: No lower extremity edema. Pulses are 2 + in the bilateral DP/PT.  Echo 02/23/17: - Left ventricle: The cavity size was normal. There was moderate   focal basal and mild concentric hypertrophy. Systolic function   was normal. The estimated ejection fraction was in the range of   60% to 65%. Wall motion was normal; there were no regional wall   motion abnormalities. There was a reduced contribution of atrial   contraction to ventricular filling, due to increased ventricular   diastolic pressure or atrial contractile dysfunction. Doppler   parameters are consistent with a reversible restrictive pattern,   indicative of decreased left ventricular diastolic compliance   and/or increased left atrial pressure (grade 3 diastolic   dysfunction). Doppler parameters are consistent with high   ventricular filling pressure. - Aortic valve: There was mild to moderate regurgitation. - Mitral valve: Severely calcified annulus. - Left atrium: The atrium was mildly dilated. - Pulmonic valve: There was trivial regurgitation. - Pulmonary arteries: PA peak pressure: 32 mm Hg (S).  EKG:  EKG is not ordered today. The ekg ordered today demonstrates  Recent Labs: 04/20/2019: B Natriuretic Peptide 265.4 09/06/2019: ALT 17 09/11/2019: BUN 24; Creatinine, Ser 0.81; Hemoglobin 9.4; Platelets 235; Potassium 4.4; Sodium 134   Lipid Panel    Component Value Date/Time   CHOL 191 02/23/2017 0503   TRIG 77 02/23/2017 0503   HDL 67 02/23/2017 0503   CHOLHDL 2.9 02/23/2017 0503   VLDL 15 02/23/2017 0503   LDLCALC 109 (H) 02/23/2017  0503     Wt Readings from Last 3 Encounters:  01/26/20 99 lb 3.2 oz (45 kg)  09/06/19 99 lb (44.9 kg)  08/13/19 102 lb (46.3 kg)     Other studies Reviewed: Additional studies/ records that were reviewed today include: . Review of the above records demonstrates:   Assessment and Plan:   1. CAD without angina: No chest pain. She has chronic dyspnea but due to advanced age is not felt to be a good cath candidate. Continue ASA and beta blocker.   2. Atrial flutter, paroxysmal: sinus today on exam. We have elected not to start anti-coagulation given her advanced age. Continue beta blocker. Stop amiodarone.   3. HTN: BP is controlled. Continue current meds.   4. Aortic insufficiency: Mild to moderate AI by echo January 2019. No change since 2016.  Will not plan to repeat echo.   5. Dyspnea: This has been persistent for years. No volume overload. Her aortic valve insufficiency is mild to moderate and likely not contributory.   6 Chronic diastolic CHF:  Weight is stable. Continue Lasix.   Current medicines are reviewed at length with the patient today.  The patient does not have concerns regarding medicines.  The following changes have been made:  no change  Labs/ tests ordered today include:  No orders of the defined types were placed in this encounter.   Disposition:   FU with me in 6 months  Signed, Lauree Chandler, MD 01/26/2020 10:26 AM    [facility] [address], [place], [place]  [address] Phone: [phone]; Fax: [phone]

## 2020-01-26 ENCOUNTER — Other Ambulatory Visit: Payer: Self-pay

## 2020-01-26 ENCOUNTER — Ambulatory Visit: Payer: Medicare PPO | Admitting: Cardiovascular Disease

## 2020-01-26 ENCOUNTER — Encounter: Payer: Self-pay | Admitting: Cardiovascular Disease

## 2020-01-26 VITALS — BP 120/70 | HR 51 | Ht 63.0 in | Wt 99.2 lb

## 2020-01-26 DIAGNOSIS — I2510 Atherosclerotic heart disease of native coronary artery without angina pectoris: Secondary | ICD-10-CM | POA: Diagnosis not present

## 2020-01-26 DIAGNOSIS — I10 Essential (primary) hypertension: Secondary | ICD-10-CM | POA: Diagnosis not present

## 2020-01-26 DIAGNOSIS — I5032 Chronic diastolic (congestive) heart failure: Secondary | ICD-10-CM | POA: Diagnosis not present

## 2020-01-26 DIAGNOSIS — I351 Nonrheumatic aortic (valve) insufficiency: Secondary | ICD-10-CM

## 2020-01-26 DIAGNOSIS — I483 Typical atrial flutter: Secondary | ICD-10-CM

## 2020-01-26 MED ORDER — OMEPRAZOLE 20 MG PO CPDR: 20.0000 mg | DELAYED_RELEASE_CAPSULE | Freq: Every day | ORAL | 3 refills | Status: DC

## 2020-01-26 NOTE — Patient Instructions (Signed)
Medication Instructions:  No changes *If you need a refill on your cardiac medications before your next appointment, please call your pharmacy*   Lab Work: none If you have labs (blood work) drawn today and your tests are completely normal, you will receive your results only by:  [place] (if you have MyChart) OR  A paper copy in the mail If you have any lab test that is abnormal or we need to change your treatment, we will call you to review the results.   Testing/Procedures: None    Follow-Up: At [facility], you and your health needs are our priority.  As part of our continuing mission to provide you with exceptional heart care, we have created designated Provider Care Teams.  These Care Teams include your primary Cardiologist (physician) and Advanced Practice Providers (APPs -  Physician Assistants and Nurse Practitioners) who all work together to provide you with the care you need, when you need it.  We recommend signing up for the patient portal called "MyChart".  Sign up information is provided on this After Visit Summary.  MyChart is used to connect with patients for Virtual Visits (Telemedicine).  Patients are able to view lab/test results, encounter notes, upcoming appointments, etc.  Non-urgent messages can be sent to your provider as well.   To learn more about what you can do with MyChart, go to [email or web address].    Your next appointment:   6 month(s)  The format for your next appointment:   In Person  Provider:   You may see Lauree Chandler, MD or one of the following Advanced Practice Providers on your designated Care Team:    Melina Copa, PA-C  Ermalinda Barrios, PA-C    Other Instructions

## 2020-04-13 ENCOUNTER — Other Ambulatory Visit: Payer: Self-pay

## 2020-04-13 ENCOUNTER — Emergency Department (HOSPITAL_COMMUNITY): Payer: Medicare PPO

## 2020-04-13 ENCOUNTER — Emergency Department (HOSPITAL_COMMUNITY)
Admission: EM | Admit: 2020-04-13 | Discharge: 2020-04-13 | Disposition: A | Discharge status: Home / Self Care | Payer: Medicare PPO | Attending: Emergency Medicine | Admitting: Emergency Medicine

## 2020-04-13 DIAGNOSIS — I4891 Unspecified atrial fibrillation: Secondary | ICD-10-CM | POA: Diagnosis not present

## 2020-04-13 DIAGNOSIS — Z7982 Long term (current) use of aspirin: Secondary | ICD-10-CM | POA: Insufficient documentation

## 2020-04-13 DIAGNOSIS — Z85048 Personal history of other malignant neoplasm of rectum, rectosigmoid junction, and anus: Secondary | ICD-10-CM | POA: Diagnosis not present

## 2020-04-13 DIAGNOSIS — Z8673 Personal history of transient ischemic attack (TIA), and cerebral infarction without residual deficits: Secondary | ICD-10-CM | POA: Insufficient documentation

## 2020-04-13 DIAGNOSIS — I2510 Atherosclerotic heart disease of native coronary artery without angina pectoris: Secondary | ICD-10-CM | POA: Diagnosis not present

## 2020-04-13 DIAGNOSIS — Z955 Presence of coronary angioplasty implant and graft: Secondary | ICD-10-CM | POA: Diagnosis not present

## 2020-04-13 DIAGNOSIS — R7401 Elevation of levels of liver transaminase levels: Secondary | ICD-10-CM | POA: Diagnosis not present

## 2020-04-13 DIAGNOSIS — I10 Essential (primary) hypertension: Secondary | ICD-10-CM | POA: Insufficient documentation

## 2020-04-13 DIAGNOSIS — Z79899 Other long term (current) drug therapy: Secondary | ICD-10-CM | POA: Insufficient documentation

## 2020-04-13 DIAGNOSIS — M546 Pain in thoracic spine: Secondary | ICD-10-CM | POA: Diagnosis not present

## 2020-04-13 DIAGNOSIS — E860 Dehydration: Secondary | ICD-10-CM | POA: Insufficient documentation

## 2020-04-13 DIAGNOSIS — R531 Weakness: Secondary | ICD-10-CM

## 2020-04-13 LAB — CBC WITH DIFFERENTIAL/PLATELET
Abs Immature Granulocytes: 0.01 10*3/uL (ref 0.00–0.07)
Basophils Absolute: 0 10*3/uL (ref 0.0–0.1)
Basophils Relative: 1 %
Eosinophils Absolute: 0.1 10*3/uL (ref 0.0–0.5)
Eosinophils Relative: 2 %
HCT: 37.3 % (ref 36.0–46.0)
Hemoglobin: 12.3 g/dL (ref 12.0–15.0)
Immature Granulocytes: 0 %
Lymphocytes Relative: 28 %
Lymphs Abs: 1.5 10*3/uL (ref 0.7–4.0)
MCH: 32.4 pg (ref 26.0–34.0)
MCHC: 33 g/dL (ref 30.0–36.0)
MCV: 98.2 fL (ref 80.0–100.0)
Monocytes Absolute: 0.3 10*3/uL (ref 0.1–1.0)
Monocytes Relative: 6 %
Neutro Abs: 3.4 10*3/uL (ref 1.7–7.7)
Neutrophils Relative %: 63 %
Platelets: 190 10*3/uL (ref 150–400)
RBC: 3.8 MIL/uL — ABNORMAL LOW (ref 3.87–5.11)
RDW: 11.9 % (ref 11.5–15.5)
WBC: 5.4 10*3/uL (ref 4.0–10.5)
nRBC: 0 % (ref 0.0–0.2)

## 2020-04-13 LAB — COMPREHENSIVE METABOLIC PANEL
ALT: 180 U/L — ABNORMAL HIGH (ref 0–44)
AST: 257 U/L — ABNORMAL HIGH (ref 15–41)
Albumin: 3.2 g/dL — ABNORMAL LOW (ref 3.5–5.0)
Alkaline Phosphatase: 81 U/L (ref 38–126)
Anion gap: 10 (ref 5–15)
BUN: 26 mg/dL — ABNORMAL HIGH (ref 8–23)
CO2: 24 mmol/L (ref 22–32)
Calcium: 9.3 mg/dL (ref 8.9–10.3)
Chloride: 104 mmol/L (ref 98–111)
Creatinine, Ser: 0.82 mg/dL (ref 0.44–1.00)
GFR, Estimated: >60 mL/min (ref >60)
Glucose, Bld: 91 mg/dL (ref 70–99)
Potassium: 4.4 mmol/L (ref 3.5–5.1)
Sodium: 138 mmol/L (ref 135–145)
Total Bilirubin: 0.7 mg/dL (ref 0.3–1.2)
Total Protein: 6.6 g/dL (ref 6.5–8.1)

## 2020-04-13 LAB — URINALYSIS, ROUTINE W REFLEX MICROSCOPIC
Bilirubin Urine: NEGATIVE
Glucose, UA: NEGATIVE mg/dL
Hgb urine dipstick: NEGATIVE
Ketones, ur: NEGATIVE mg/dL
Leukocytes,Ua: NEGATIVE
Nitrite: NEGATIVE
Protein, ur: NEGATIVE mg/dL
Specific Gravity, Urine: 1.01 (ref 1.005–1.030)
pH: 5 (ref 5.0–8.0)

## 2020-04-13 LAB — TROPONIN I (HIGH SENSITIVITY)
Troponin I (High Sensitivity): 52 ng/L — ABNORMAL HIGH (ref <18)
Troponin I (High Sensitivity): 70 ng/L — ABNORMAL HIGH (ref <18)

## 2020-04-13 MED ORDER — SODIUM CHLORIDE 0.9 % IV BOLUS
250.0000 mL | Freq: Once | INTRAVENOUS | Status: AC
  Administered 2020-04-13: 250 mL via INTRAVENOUS

## 2020-04-13 MED ORDER — METOPROLOL TARTRATE 5 MG/5ML IV SOLN
2.5000 mg | Freq: Once | INTRAVENOUS | Status: AC
  Administered 2020-04-13: 2.5 mg via INTRAVENOUS
  Filled 2020-04-13: qty 5

## 2020-04-13 NOTE — ED Triage Notes (Signed)
BIB EMS for afib RVR, 166. Given 500 IVF and went down to 105 afib. Pt is 60-80s afib baseline and takes metoprolol everyday but missed yesterday. This morning she took two as directed by her PCP. Overall pt feels weak and lethargic since this morning. Pt had pain in between shoulder blades this morning that subsided immediately. BP 140/62, no SOB, no CP.

## 2020-04-13 NOTE — ED Provider Notes (Addendum)
[facility] EMERGENCY DEPARTMENT Provider Note   CSN: [id number] Arrival date & time: 04/13/20  1204     History Chief Complaint  Patient presents with  . Atrial Fibrillation    [name] is a 87 y.o. female.  The history is provided by the patient.  Back Pain Location:  Thoracic spine Quality:  Aching Pain severity:  Mild Onset quality:  Sudden Progression:  Resolved Chronicity:  New Context: physical stress   Relieved by:  Nothing Worsened by:  Nothing Associated symptoms: no abdominal pain, no abdominal swelling, no bladder incontinence, no bowel incontinence, no chest pain, no dysuria, no fever, no headaches, no leg pain, no pelvic pain, no perianal numbness, no tingling and no weakness        Past Medical History:  Diagnosis Date  . Arthritis    FINGERS   . CAD (coronary artery disease) CARDIOLOGIST-- DR Angelena Form   a. s/p INF STEMI 7/12: tx with BMS to RCA;  b. cath 08/26/10: pLAD 30%, mLAD 50%, D1 40%, pCFX 95%, mRCA occluded;   c. staged PCI of pCFX with BMS;   d. echo 7/12:   EF 60-65%, mild RAE, mild to moderate AI, mild MR, moderate TR, RVE, PASP 47  . Complication of anesthesia PT STATES "MADE HER FEEL CRAZY"  . Degeneration of eye    left eye cornea  . Dyspnea   . First degree heart block   . Heart palpitations PAC'S AND SVT RUN'S  PER CARDIOLOGIST NOTE  . History of ST elevation myocardial infarction (STEMI) 08-26-2010-- INFERIOR WALL   S/P PCI  BMS IN RCA AND PROX. CX  . Hyperlipidemia   . Hypertension   . Impaired hearing BILATERAL HEARING AIDS  . Osteopenia   . PAF (paroxysmal atrial fibrillation) ([place])   . PONV (postoperative nausea and vomiting)   . Pulmonary nodules BENIGN  PER CT  10-12-2010  . Rectal Cancer 08/2010   adenocarcinoma   S/P PARTIAL PROCTECTOMY (NO CHEMO OR RADIATION)  . 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 S/P  coronary artery stent placement 08/2010   X2  BM    Patient Active Problem List   Diagnosis Date Noted  . Closed fracture of multiple pubic rami, left, initial encounter ([place]) 09/06/2019  . Slurred speech 02/23/2017  . PAF (paroxysmal atrial fibrillation) ([place]) 02/23/2017  . Depression with anxiety 02/23/2017  . TIA (transient ischemic attack)   . Fracture, tibia and fibula 07/06/2015  . Tibia/fibula fracture 07/03/2015  . Lung nodule seen on imaging study 09/05/2013  . Exposure to TB 09/05/2013  . SOB (shortness of breath) 10/02/2011  . Vascular skin changes 07/17/2011  . 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 Anorexia post-op 12/08/2010  . HTN (hypertension) 11/03/2010  . Dizziness 09/15/2010  . CAD (coronary artery disease)   . Hyperlipidemia   . Rectal cancer, pT2uN0(pNX) s/p TEM partial proctectomy 09/07/2010    Past Surgical History:  Procedure Laterality Date  . ABDOMINAL HYSTERECTOMY  1950's   AND APPENDECTOMY  . CATARACT EXTRACTION W/ INTRAOCULAR LENS  IMPLANT, BILATERAL    . CORONARY ANGIOPLASTY WITH STENT PLACEMENT  08-26-2010  DR Ramos   PCI, BM STENT IN RCA  . CORONARY ANGIOPLASTY WITH STENT PLACEMENT  08-29-2010  DR Lyman   PCI, BM STENT IN PROXIMAL CIRCUMFLEX  . EXCISION BENIGN CYST RIGHT BREAST    . FISTULA PLUG N/A  06/21/2012   Procedure: insertion of FISTULA PLUG;  Surgeon: Leighton Ruff, MD;  Location: [facility];  Service: General;  Laterality: N/A;  . FLEXIBLE SIGMOIDOSCOPY N/A 04/02/2012   Procedure: FLEXIBLE SIGMOIDOSCOPY;  Surgeon: Leighton Ruff, MD;  Location: [facility];  Service: Endoscopy;  Laterality: N/A;  . KNEE SURGERY Right 1996  . LAPROSCOPY LYSIS ADHESIONS/ DRAINAGE OF PELVIC ABSCESS/ DIVERTING LOOP SIGMOID COLECTOMY  11-22-2010   POST OP RECTOVAGINAL FISTULA  . PARTIAL PROCTECTOMY BY TEM  11-17-2010   RECTAL CANCER  . RELEASE LEFT CARPAL TUNNEL/ OSTEOTOMY LEFT DISTAL RADIUS  11-10-2009   . STAPEDECTOMY  1970'S  . TONSILLECTOMY  CHILD  . TOTAL HIP ARTHROPLASTY Right 1992  . TRANSTHORACIC ECHOCARDIOGRAM  10-10-2011   NORMAL LV SIZE WITH MILD FOCAL BASAL SEPTAL HYPERTROPHY/ EF 55-60%/ NORMAL RV SIZE AND LVSF/ BIATRIAL ENLARGEMENT/ MILD TO MODERATE AI  &  TR  . TYMPANOPLASTY Left 12-23-2009     OB History   No obstetric history on file.     Family History  Problem Relation Age of Onset  . Kidney disease Mother   . Heart disease Father   . Cancer Brother     Social History   Tobacco Use  . Smoking status: Never Smoker  . Smokeless tobacco: Never Used  Substance Use Topics  . Alcohol use: No  . Drug use: No    Home Medications Prior to Admission medications   Medication Sig Start Date End Date Taking? Authorizing Provider  acetaminophen (TYLENOL) 500 MG tablet Take 2 tablets (1,000 mg total) by mouth every 6 (six) hours as needed for mild pain. 07/06/15   Rama, Venetia Maxon, MD  amiodarone (PACERONE) 200 MG tablet  10/09/19   [provider]  aspirin EC 81 MG tablet Take 81 mg by mouth daily.    [provider]  beta carotene w/minerals (OCUVITE) tablet Take 1 tablet by mouth daily.    [provider]  BIOTIN PO Take 1 tablet by mouth daily.     [provider]  Cholecalciferol (VITAMIN D) 50 MCG (2000 UT) tablet Take 2,000 Units by mouth daily.    [provider]  enoxaparin (LOVENOX) 30 MG/0.3ML injection Inject 0.3 mLs (30 mg total) into the skin daily for 14 days. 09/12/19 09/26/19  Wyvonnia Dusky, MD  furosemide (LASIX) 40 MG tablet Take 40 mg by mouth daily. Per patient taking 1/2 tablet    [provider]  melatonin 5 MG TABS Take 5 mg by mouth at bedtime.    [provider]  metoprolol succinate (TOPROL XL) 25 MG 24 hr tablet Take 1 tablet (25 mg total) by mouth daily. 04/04/19   Burnell Blanks, MD  nitroGLYCERIN (NITROSTAT) 0.4 MG SL tablet Place 0.4 mg under the tongue every 5  (five) minutes as needed for chest pain.    [provider]  omeprazole (PRILOSEC) 20 MG capsule Take 1 capsule (20 mg total) by mouth daily. 01/26/20   Burnell Blanks, MD  Polyethyl Glycol-Propyl Glycol (SYSTANE) 0.4-0.3 % GEL ophthalmic gel Place 1 drop into both eyes at bedtime.     [provider]  sertraline (ZOLOFT) 25 MG tablet  10/28/19   [provider]    Allergies    Effexor [venlafaxine hydrochloride], Penicillins, Sulfa antibiotics, Dicyclomine, Lactose intolerance (gi), and Hydrocodone  Review of Systems   Review of Systems  Constitutional: Negative for chills and fever.  HENT: Negative for ear pain and sore throat.  Eyes: Negative for pain and visual disturbance.  Respiratory: Negative for cough and shortness of breath.   Cardiovascular: Negative for chest pain and palpitations.  Gastrointestinal: Negative for abdominal pain, bowel incontinence and vomiting.  Genitourinary: Negative for bladder incontinence, dysuria, hematuria and pelvic pain.  Musculoskeletal: Positive for back pain. Negative for arthralgias.  Skin: Negative for color change and rash.  Neurological: Negative for tingling, seizures, syncope, weakness and headaches.  All other systems reviewed and are negative.   Physical Exam Updated Vital Signs  ED Triage Vitals  Enc Vitals Group     BP 04/13/20 1218 (!) 127/58     Pulse Rate 04/13/20 1218 (!) 107     Resp 04/13/20 1218 18     Temp 04/13/20 1218 98.1 F (36.7 C)     Temp Source 04/13/20 1218 Oral     SpO2 04/13/20 1218 96 %     Weight 04/13/20 1223 98 lb (44.5 kg)     Height 04/13/20 1223 5\' 3"  (1.6 m)     Head Circumference --      Peak Flow --      Pain Score 04/13/20 1223 0     Pain Loc --      Pain Edu? --      Excl. in [place]? --     Physical Exam Vitals and nursing note reviewed.  Constitutional:      General: She is not in acute distress.    Appearance: She is well-developed and well-nourished.  She is not ill-appearing.  HENT:     Head: Normocephalic and atraumatic.     Nose: Nose normal.     Mouth/Throat:     Mouth: Mucous membranes are moist.  Eyes:     Extraocular Movements: Extraocular movements intact.     Conjunctiva/sclera: Conjunctivae normal.     Pupils: Pupils are equal, round, and reactive to light.  Cardiovascular:     Rate and Rhythm: Tachycardia present. Rhythm irregular.     Pulses: Normal pulses.     Heart sounds: Normal heart sounds. No murmur heard.   Pulmonary:     Effort: Pulmonary effort is normal. No respiratory distress.     Breath sounds: Normal breath sounds.  Abdominal:     Palpations: Abdomen is soft.     Tenderness: There is no abdominal tenderness.  Musculoskeletal:        General: No tenderness or edema. Normal range of motion.     Cervical back: Normal range of motion and neck supple.  Skin:    General: Skin is warm and dry.     Capillary Refill: Capillary refill takes less than 2 seconds.  Neurological:     General: No focal deficit present.     Mental Status: She is alert.  Psychiatric:        Mood and Affect: Mood and affect normal.     ED Results / Procedures / Treatments   Labs (all labs ordered are listed, but only abnormal results are displayed) Labs Reviewed  CBC WITH DIFFERENTIAL/PLATELET - Abnormal; Notable for the following components:      Result Value   RBC 3.80 (*)    All other components within normal limits  COMPREHENSIVE METABOLIC PANEL - Abnormal; Notable for the following components:   BUN 26 (*)    Albumin 3.2 (*)    AST 257 (*)    ALT 180 (*)    All other components within normal limits  TROPONIN I (HIGH SENSITIVITY) - Abnormal; Notable  for the following components:   Troponin I (High Sensitivity) 52 (*)    All other components within normal limits  TROPONIN I (HIGH SENSITIVITY) - Abnormal; Notable for the following components:   Troponin I (High Sensitivity) 70 (*)    All other components within  normal limits  URINALYSIS, ROUTINE W REFLEX MICROSCOPIC    EKG EKG Interpretation  Date/Time:  Tuesday April 13 2020 12:20:37 EST Ventricular Rate:  123 PR Interval:    QRS Duration: 136 QT Interval:  371 QTC Calculation: 531 R Axis:   -19 Text Interpretation: Atrial fibrillation IVCD, consider atypical RBBB LVH with secondary repolarization abnormality Anterior infarct, old Prolonged QT interval Confirmed by Lennice Sites [phone]) on 04/13/2020 12:34:05 PM   Radiology DG Chest Portable 1 View  Result Date: 04/13/2020 CLINICAL DATA:  Pain EXAM: PORTABLE CHEST 1 VIEW COMPARISON:  09/06/2019 FINDINGS: Chronic interstitial prominence. No new consolidation or edema. No pleural effusion or pneumothorax. Stable cardiomediastinal contours. Chronic left humeral fracture. IMPRESSION: No acute process in the chest. Electronically Signed   By: Macy Mis M.D.   On: 04/13/2020 12:58    Procedures Procedures   Medications Ordered in ED Medications  sodium chloride 0.9 % bolus 250 mL (250 mLs Intravenous New Bag/Given 04/13/20 1400)  sodium chloride 0.9 % bolus 250 mL (250 mLs Intravenous New Bag/Given 04/13/20 1440)  metoprolol tartrate (LOPRESSOR) injection 2.5 mg (2.5 mg Intravenous Given 04/13/20 1504)    ED Course  I have reviewed the triage vital signs and the nursing notes.  Pertinent labs & imaging results that were available during my care of the patient were reviewed by me and considered in my medical decision making (see chart for details).    MDM Rules/Calculators/A&P                          [name] is a 87 year old female with history of A. fib, hypertension who presents to the ED after some back pain while trying to stand up today.  Patient with overall normal vitals.  HR in 160s with ems improved with fluids. She has just taken extra 25 mg of metoprolol prior to ems coming. Patient has A. fib with heart rate in the low 100s and 90s.  Had missed her dose of metoprolol  yesterday and took double the amount today.  Denies any current chest pain, shortness of breath, abdominal pain.  Denies any bloody stool.  No urinary tract symptoms.  No cough, no sputum production.  Patient with normal neurological exam.  Overall appears well.  She is already gotten small fluid bolus who is EMS and will give her some more IV fluids while checking basic labs including troponin.  EKG shows A. fib but no obvious ischemic changes.  Patient continues to feel well.  Feeling better after some IV fluids.  Lab work shows no significant anemia or leukocytosis.  No significant electrolyte abnormalities.  Liver function is mildly elevated with AST of 250 and ALT of 180.  May be from dehydration as patient does appear dry on exam.  Will give additional IV fluids.  She is not having any abdominal pain.  First troponin is 52 and will trend.  Not having any active chest pain.  Urinalysis negative for infection.  Patient prefers to be discharged as she is feeling well.  I have a low suspicion for ACS and suspect that troponin is mildly elevated in the setting of demand given that she has  had slightly higher heart rate.  She did have A. fib in the 160s with EMS.  We will give a small dose of IV Lopressor.  Heart rate has improved to the 80s to the 110 range.  Troponin stable at 70.  Have a low suspicion for ACS as she is had no chest pain.  Overall suspect troponin leak likely from demand from tachycardia.  She does not want to stay for admission and prefers to follow-up with her cardiologist primary care doctor.  She understands to return if symptoms worsen.  Overall suspect that her A. fib has been on the faster side recently.  She has been told in the past that she can take extra doses of her metoprolol and recommend that she consider taking an half a tablet to full tablet of her metoprolol at night as needed.  She understands return precautions and was discharged from the ED in good condition.  This chart  was dictated using voice recognition software.  Despite best efforts to proofread,  errors can occur which can change the documentation meaning.    Final Clinical Impression(s) / ED Diagnoses Final diagnoses:  Weakness  Dehydration  Atrial fibrillation, unspecified type [facility])    Rx / DC Orders ED Discharge Orders    None       Lennice Sites, DO 04/13/20 [place], [place], DO 04/13/20 [place], [place], DO 04/13/20 1559    [place], [place], DO 04/13/20 1601

## 2020-04-13 NOTE — Discharge Instructions (Addendum)
Make sure to hydrate well at home.  Please take your medications as prescribed.  Follow-up with your primary care/cardiology doctor to have your liver enzymes rechecked and to discuss further management of your heart rate. It is okay to take an extra half pill or full pill of your metoprolol at night if needed as cardiology has recommended in the past.  Please return to the emergency department if symptoms worsen.

## 2020-04-25 ENCOUNTER — Other Ambulatory Visit: Payer: Self-pay | Admitting: Cardiovascular Disease

## 2020-04-26 ENCOUNTER — Telehealth: Payer: Self-pay | Admitting: Cardiovascular Disease

## 2020-04-26 MED ORDER — METOPROLOL SUCCINATE ER 25 MG PO TB24
25.0000 mg | ORAL_TABLET | Freq: Two times a day (BID) | ORAL | 5 refills | Status: DC
Start: 2020-04-26 — End: 2020-04-27

## 2020-04-26 NOTE — Telephone Encounter (Addendum)
Started last Monday w SOB but not this bad.  Had afib and went to the hospital.  She doesn't feel afib now. "I have a feeling it might be congestive heart failure."  She did get IVF at the hospital but that was 2 weeks ago. Has a little bit of swelling in ankles, this has been going on for 2-3 months and is not worse.   No chest pain or pressure, no irregular HR or pounding  BP/HR - And is usually good 120/70s per pt.  I asked her to check today but apparently the cuff is not working for her.  She is slightly winded in speaking on the phone.  breating no worse w lying down, no cough.  Sitting still it is ok, worse w exertion.   She does not take amiodarone because she did not realize what it was for.  Usually only takes 20 mg (half) lasix.  I adv her to take the other half tab this morning.  She has done this a couple times previously for this SOB.  She is not sure if he helps.    Aware I will talk with Dr. Lovena Le (DOD) and call her back with any further recommendations.    Per Dr. Lovena Le, pt should be seen at afib clinic and should increase Toprol XL to 25 mg BID.  She has been notified and verbalizes understanding and agreement.

## 2020-04-26 NOTE — Telephone Encounter (Signed)
Pt c/o Shortness Of Breath: STAT if SOB developed within the last 24 hours or pt is noticeably SOB on the phone  1. Are you currently SOB (can you hear that pt is SOB on the phone)? Yes, cannot hear over phone   2. How long have you been experiencing SOB? At least a week has occurred before, but has worsened   3. Are you SOB when sitting or when up moving around? Both   4. Are you currently experiencing any other symptoms? Not that she knows of   [name] is calling stating she has been having worsened SOB for the past week and also was recently hospitalized for Afib. She is requesting an appt with Dr. Angelena Form ASAP in regards to this. Please advise.

## 2020-04-27 ENCOUNTER — Encounter (HOSPITAL_COMMUNITY): Payer: Self-pay | Admitting: Physician Assistant

## 2020-04-27 ENCOUNTER — Ambulatory Visit (HOSPITAL_COMMUNITY)
Admission: RE | Admit: 2020-04-27 | Discharge: 2020-04-27 | Disposition: A | Discharge status: Home / Self Care | Payer: Medicare PPO | Source: Ambulatory Visit | Attending: Physician Assistant | Admitting: Physician Assistant

## 2020-04-27 ENCOUNTER — Other Ambulatory Visit: Payer: Self-pay

## 2020-04-27 VITALS — BP 106/58 | HR 87 | Ht 63.0 in | Wt 103.2 lb

## 2020-04-27 DIAGNOSIS — I4892 Unspecified atrial flutter: Secondary | ICD-10-CM | POA: Diagnosis not present

## 2020-04-27 DIAGNOSIS — D6869 Other thrombophilia: Secondary | ICD-10-CM | POA: Insufficient documentation

## 2020-04-27 DIAGNOSIS — Z7982 Long term (current) use of aspirin: Secondary | ICD-10-CM | POA: Diagnosis not present

## 2020-04-27 DIAGNOSIS — I2510 Atherosclerotic heart disease of native coronary artery without angina pectoris: Secondary | ICD-10-CM | POA: Diagnosis not present

## 2020-04-27 DIAGNOSIS — Z8673 Personal history of transient ischemic attack (TIA), and cerebral infarction without residual deficits: Secondary | ICD-10-CM | POA: Diagnosis not present

## 2020-04-27 DIAGNOSIS — Z7901 Long term (current) use of anticoagulants: Secondary | ICD-10-CM | POA: Diagnosis not present

## 2020-04-27 DIAGNOSIS — I351 Nonrheumatic aortic (valve) insufficiency: Secondary | ICD-10-CM | POA: Diagnosis not present

## 2020-04-27 DIAGNOSIS — I340 Nonrheumatic mitral (valve) insufficiency: Secondary | ICD-10-CM | POA: Insufficient documentation

## 2020-04-27 DIAGNOSIS — Z79899 Other long term (current) drug therapy: Secondary | ICD-10-CM | POA: Insufficient documentation

## 2020-04-27 DIAGNOSIS — I10 Essential (primary) hypertension: Secondary | ICD-10-CM | POA: Diagnosis not present

## 2020-04-27 MED ORDER — AMIODARONE HCL 200 MG PO TABS: 100.0000 mg | ORAL_TABLET | Freq: Every day | ORAL | 1 refills | Status: DC

## 2020-04-27 MED ORDER — METOPROLOL SUCCINATE ER 25 MG PO TB24
12.5000 mg | ORAL_TABLET | Freq: Every day | ORAL | 5 refills | Status: DC
Start: 2020-04-27 — End: 2020-07-02

## 2020-04-27 NOTE — Progress Notes (Signed)
Primary Care Physician: Leonard Downing, MD Primary Cardiologist: Dr Angelena Form Primary Electrophysiologist: none Referring Physician: Dr Myrla Halsted is a 87 y.o. female with a history of CAD, aortic valve insufficiency, mitral regurgitation, prior TIA, rectal cancer and atrial flutter who presents for consultation in the [facility].  The patient was initially diagnosed with atrial flutter in September 2017. Anti-coagulation was not started given advanced age and fall risk. She has been on ASA and a beta blocker. She was admitted with a probable TIA in January 2018. She has a CHADS2VASC score of 6. Patient was seen by Dr Angelena Form 01/2020 and her amiodarone was stopped at that time. She was seen at the ED 04/13/20 with rapid rates. She was given a fluid bolus and her heart rates slowed. Her Toprol was increased by DOD over the phone on 04/26/20 but she never started the higher dose. She reports fatigue and SOB but his appears to be chronic for her. She denies any symptoms of fluid overload.   Today, she denies symptoms of chest pain, orthopnea, PND, lower extremity edema, dizziness, presyncope, syncope, snoring, daytime somnolence, bleeding, or neurologic sequela. The patient is tolerating medications without difficulties and is otherwise without complaint today.    Atrial Fibrillation Risk Factors:  she does not have symptoms or diagnosis of sleep apnea. she does not have a history of rheumatic fever. she does not have a history of alcohol use.   she has a BMI of Body mass index is 18.28 kg/m.Marland Kitchen Filed Weights   04/27/20 1449  Weight: 46.8 kg    Family History  Problem Relation Age of Onset  . Kidney disease Mother   . Heart disease Father   . Cancer Brother      Atrial Fibrillation Management history:  Previous antiarrhythmic drugs: amiodarone  Previous cardioversions: none Previous ablations: none CHADS2VASC score:  6 Anticoagulation history: none   Past Medical History:  Diagnosis Date  . Arthritis    FINGERS   . CAD (coronary artery disease) CARDIOLOGIST-- DR Angelena Form   a. s/p INF STEMI 7/12: tx with BMS to RCA;  b. cath 08/26/10: pLAD 30%, mLAD 50%, D1 40%, pCFX 95%, mRCA occluded;   c. staged PCI of pCFX with BMS;   d. echo 7/12:   EF 60-65%, mild RAE, mild to moderate AI, mild MR, moderate TR, RVE, PASP 47  . Complication of anesthesia PT STATES "MADE HER FEEL CRAZY"  . Degeneration of eye    left eye cornea  . Dyspnea   . First degree heart block   . Heart palpitations PAC'S AND SVT RUN'S  PER CARDIOLOGIST NOTE  . History of ST elevation myocardial infarction (STEMI) 08-26-2010-- INFERIOR WALL   S/P PCI  BMS IN RCA AND PROX. CX  . Hyperlipidemia   . Hypertension   . Impaired hearing BILATERAL HEARING AIDS  . Osteopenia   . PAF (paroxysmal atrial fibrillation) ([place])   . PONV (postoperative nausea and vomiting)   . Pulmonary nodules BENIGN  PER CT  10-12-2010  . Rectal Cancer 08/2010   adenocarcinoma   S/P PARTIAL PROCTECTOMY (NO CHEMO OR RADIATION)  . 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 S/P coronary artery stent placement 08/2010   X2  BM   Past Surgical History:  Procedure Laterality Date  . ABDOMINAL HYSTERECTOMY  1950's   AND APPENDECTOMY  . CATARACT EXTRACTION W/  INTRAOCULAR LENS  IMPLANT, BILATERAL    . CORONARY ANGIOPLASTY WITH STENT PLACEMENT  08-26-2010  DR [place]   PCI, BM STENT IN RCA  . CORONARY ANGIOPLASTY WITH STENT PLACEMENT  08-29-2010  DR [place]   PCI, BM STENT IN PROXIMAL CIRCUMFLEX  . EXCISION BENIGN CYST RIGHT BREAST    . FISTULA PLUG N/A 06/21/2012   Procedure: insertion of FISTULA PLUG;  Surgeon: Leighton Ruff, MD;  Location: [facility];  Service: General;  Laterality: N/A;  . FLEXIBLE SIGMOIDOSCOPY N/A 04/02/2012   Procedure: FLEXIBLE SIGMOIDOSCOPY;   Surgeon: Leighton Ruff, MD;  Location: [facility];  Service: Endoscopy;  Laterality: N/A;  . KNEE SURGERY Right 1996  . LAPROSCOPY LYSIS ADHESIONS/ DRAINAGE OF PELVIC ABSCESS/ DIVERTING LOOP SIGMOID COLECTOMY  11-22-2010   POST OP RECTOVAGINAL FISTULA  . PARTIAL PROCTECTOMY BY TEM  11-17-2010   RECTAL CANCER  . RELEASE LEFT CARPAL TUNNEL/ OSTEOTOMY LEFT DISTAL RADIUS  11-10-2009  . STAPEDECTOMY  1970'S  . TONSILLECTOMY  CHILD  . TOTAL HIP ARTHROPLASTY Right 1992  . TRANSTHORACIC ECHOCARDIOGRAM  10-10-2011   NORMAL LV SIZE WITH MILD FOCAL BASAL SEPTAL HYPERTROPHY/ EF 55-60%/ NORMAL RV SIZE AND LVSF/ BIATRIAL ENLARGEMENT/ MILD TO MODERATE AI  &  TR  . TYMPANOPLASTY Left 12-23-2009    Current Outpatient Medications  Medication Sig Dispense Refill  . acetaminophen (TYLENOL) 500 MG tablet Take 2 tablets (1,000 mg total) by mouth every 6 (six) hours as needed for mild pain. 30 tablet 0  . aspirin EC 81 MG tablet Take 81 mg by mouth daily.    . beta carotene w/minerals (OCUVITE) tablet Take 1 tablet by mouth daily.    Marland Kitchen BIOTIN PO Take 1 tablet by mouth daily.     . Cholecalciferol (VITAMIN D) 50 MCG (2000 UT) tablet Take 2,000 Units by mouth daily.    . furosemide (LASIX) 40 MG tablet Take 40 mg by mouth daily. Per patient taking 1/2 tablet    . melatonin 5 MG TABS Take 5 mg by mouth at bedtime.    . metoprolol succinate (TOPROL XL) 25 MG 24 hr tablet Take 1 tablet (25 mg total) by mouth in the morning and at bedtime. (Patient taking differently: Take 25 mg by mouth daily.) 60 tablet 5  . nitroGLYCERIN (NITROSTAT) 0.4 MG SL tablet Place 0.4 mg under the tongue every 5 (five) minutes as needed for chest pain.    Vladimir Faster Glycol-Propyl Glycol (SYSTANE) 0.4-0.3 % GEL ophthalmic gel Place 1 drop into both eyes at bedtime.     . sertraline (ZOLOFT) 25 MG tablet     . enoxaparin (LOVENOX) 30 MG/0.3ML injection Inject 0.3 mLs (30 mg total) into the skin daily for 14 days.     No current  facility-administered medications for this encounter.    Allergies  Allergen Reactions  . Effexor [Venlafaxine Hydrochloride] Other (See Comments)    Reaction not recalled by the patient  . Penicillins Shortness Of Breath    Has patient had a PCN reaction causing immediate rash, facial/tongue/throat swelling, SOB or lightheadedness with hypotension: Yes Has patient had a PCN reaction causing severe rash involving mucus membranes or skin necrosis: No Has patient had a PCN reaction that required hospitalization: No Has patient had a PCN reaction occurring within the last 10 years: No If all of the above answers are "NO", then may proceed with Cephalosporin use.   . Sulfa Antibiotics Nausea Only  . Dicyclomine Other (See Comments)    Caused confusion  .  Lactose Intolerance (Gi) Diarrhea  . Hydrocodone Nausea Only    Social History   Socioeconomic History  . Marital status: Widowed    Spouse name: Not on file  . Number of children: Not on file  . Years of education: Not on file  . Highest education level: Not on file  Occupational History  . Not on file  Tobacco Use  . Smoking status: Never Smoker  . Smokeless tobacco: Never Used  Substance and Sexual Activity  . Alcohol use: No  . Drug use: No  . Sexual activity: Not on file  Other Topics Concern  . Not on file  Social History Narrative  . Not on file   Social Determinants of Health   Financial Resource Strain: Not on file  Food Insecurity: Not on file  Transportation Needs: Not on file  Physical Activity: Not on file  Stress: Not on file  Social Connections: Not on file  Intimate Partner Violence: Not on file     ROS- All systems are reviewed and negative except as per the HPI above.  Physical Exam: Vitals:   04/27/20 1449  BP: (!) 106/58  Pulse: 87  Weight: 46.8 kg  Height: 5\' 3"  (1.6 m)    GEN- The patient is a well appearing elderly female, alert and oriented x 3 today.   Head- normocephalic,  atraumatic Eyes-  Sclera clear, conjunctiva pink Ears- hearing intact Oropharynx- clear Neck- supple  Lungs- Clear to ausculation bilaterally, normal work of breathing Heart- Regular rate and rhythm, no murmurs, rubs or gallops  GI- soft, NT, ND, + BS Extremities- no clubbing, cyanosis, or edema MS- no significant deformity or atrophy Skin- no rash or lesion Psych- euthymic mood, full affect Neuro- strength and sensation are intact  Wt Readings from Last 3 Encounters:  04/27/20 46.8 kg  04/13/20 44.5 kg  01/26/20 45 kg    EKG today demonstrates  SR, 1st degree AV block, IVCD Vent. rate 87 BPM PR interval 282 ms QRS duration 132 ms QT/QTc 398/478 ms  Echo 02/23/17 demonstrated  Left ventricle: The cavity size was normal. There was moderate  focal basal and mild concentric hypertrophy. Systolic function  was normal. The estimated ejection fraction was in the range of  60% to 65%. Wall motion was normal; there were no regional wall  motion abnormalities. There was a reduced contribution of atrial  contraction to ventricular filling, due to increased ventricular  diastolic pressure or atrial contractile dysfunction. Doppler  parameters are consistent with a reversible restrictive pattern,  indicative of decreased left ventricular diastolic compliance  and/or increased left atrial pressure (grade 3 diastolic  dysfunction). Doppler parameters are consistent with high  ventricular filling pressure.  - Aortic valve: There was mild to moderate regurgitation.  - Mitral valve: Severely calcified annulus.  - Left atrium: The atrium was mildly dilated.  - Pulmonic valve: There was trivial regurgitation.  - Pulmonary arteries: PA peak pressure: 32 mm Hg (S).   Epic records are reviewed at length today  CHA2DS2-VASc Score = 6  The patient's score is based upon: CHF History: No HTN History: No Diabetes History: No Stroke History: Yes Vascular Disease  History: Yes Age Score: 2 Gender Score: 1      ASSESSMENT AND PLAN: 1. Atrial flutter The patient's CHA2DS2-VASc score is 6, indicating a 9.7% annual risk of stroke.   Patient is in [place] today.  We discussed therapeutic options. Patient reports nightmares on higher doses of BB. Will resume amiodarone at  low dose 100 mg daily for symptom relief.  Continue Toprol 12.5 mg daily  2. Secondary Hypercoagulable State (ICD10:  D68.69) The patient is at significant risk for stroke/thromboembolism based upon her CHA2DS2-VASc Score of 6.  Patient is not on anticoagulation given age and high fall risk per Dr Angelena Form .   3. CAD No anginal symptoms. Not felt to be a LHC candidate.   4. HTN Stable, no changes today.   Follow up in the AF clinic in 2 weeks.    [facility] [address] [place], [place] [address] [phone] 04/27/2020 2:55 PM

## 2020-04-27 NOTE — Patient Instructions (Signed)
Decrease metoprolol to 1/2 tablet at bedtime only (12.5mg )  Start Amiodarone 1/2 tablet daily in the morning (100mg )

## 2020-04-30 ENCOUNTER — Observation Stay (HOSPITAL_COMMUNITY): Payer: Medicare PPO

## 2020-04-30 ENCOUNTER — Other Ambulatory Visit: Payer: Self-pay

## 2020-04-30 ENCOUNTER — Emergency Department (HOSPITAL_COMMUNITY): Payer: Medicare PPO

## 2020-04-30 ENCOUNTER — Inpatient Hospital Stay (HOSPITAL_COMMUNITY)
Admission: EM | Admit: 2020-04-30 | Discharge: 2020-05-04 | DRG: 308 | Disposition: A | Discharge status: Home Health | Payer: Medicare PPO | Attending: Internal Medicine | Admitting: Internal Medicine

## 2020-04-30 DIAGNOSIS — K766 Portal hypertension: Secondary | ICD-10-CM | POA: Diagnosis present

## 2020-04-30 DIAGNOSIS — N179 Acute kidney failure, unspecified: Secondary | ICD-10-CM | POA: Diagnosis present

## 2020-04-30 DIAGNOSIS — I5033 Acute on chronic diastolic (congestive) heart failure: Secondary | ICD-10-CM | POA: Diagnosis present

## 2020-04-30 DIAGNOSIS — I4891 Unspecified atrial fibrillation: Secondary | ICD-10-CM

## 2020-04-30 DIAGNOSIS — I420 Dilated cardiomyopathy: Secondary | ICD-10-CM | POA: Diagnosis present

## 2020-04-30 DIAGNOSIS — I083 Combined rheumatic disorders of mitral, aortic and tricuspid valves: Secondary | ICD-10-CM | POA: Diagnosis present

## 2020-04-30 DIAGNOSIS — H9190 Unspecified hearing loss, unspecified ear: Secondary | ICD-10-CM | POA: Diagnosis present

## 2020-04-30 DIAGNOSIS — Z882 Allergy status to sulfonamides status: Secondary | ICD-10-CM

## 2020-04-30 DIAGNOSIS — Z933 Colostomy status: Secondary | ICD-10-CM

## 2020-04-30 DIAGNOSIS — R0600 Dyspnea, unspecified: Secondary | ICD-10-CM

## 2020-04-30 DIAGNOSIS — Z888 Allergy status to other drugs, medicaments and biological substances status: Secondary | ICD-10-CM

## 2020-04-30 DIAGNOSIS — J984 Other disorders of lung: Secondary | ICD-10-CM | POA: Diagnosis present

## 2020-04-30 DIAGNOSIS — Z7982 Long term (current) use of aspirin: Secondary | ICD-10-CM

## 2020-04-30 DIAGNOSIS — I480 Paroxysmal atrial fibrillation: Secondary | ICD-10-CM | POA: Diagnosis not present

## 2020-04-30 DIAGNOSIS — Z9181 History of falling: Secondary | ICD-10-CM

## 2020-04-30 DIAGNOSIS — R0902 Hypoxemia: Secondary | ICD-10-CM | POA: Diagnosis present

## 2020-04-30 DIAGNOSIS — Z809 Family history of malignant neoplasm, unspecified: Secondary | ICD-10-CM

## 2020-04-30 DIAGNOSIS — Z841 Family history of disorders of kidney and ureter: Secondary | ICD-10-CM

## 2020-04-30 DIAGNOSIS — R140 Abdominal distension (gaseous): Secondary | ICD-10-CM

## 2020-04-30 DIAGNOSIS — Z974 Presence of external hearing-aid: Secondary | ICD-10-CM

## 2020-04-30 DIAGNOSIS — Z9842 Cataract extraction status, left eye: Secondary | ICD-10-CM

## 2020-04-30 DIAGNOSIS — I700 Atherosclerosis of aorta: Secondary | ICD-10-CM | POA: Diagnosis present

## 2020-04-30 DIAGNOSIS — R188 Other ascites: Secondary | ICD-10-CM | POA: Diagnosis present

## 2020-04-30 DIAGNOSIS — Z885 Allergy status to narcotic agent status: Secondary | ICD-10-CM

## 2020-04-30 DIAGNOSIS — Z955 Presence of coronary angioplasty implant and graft: Secondary | ICD-10-CM

## 2020-04-30 DIAGNOSIS — E785 Hyperlipidemia, unspecified: Secondary | ICD-10-CM | POA: Diagnosis present

## 2020-04-30 DIAGNOSIS — R531 Weakness: Secondary | ICD-10-CM

## 2020-04-30 DIAGNOSIS — I4892 Unspecified atrial flutter: Secondary | ICD-10-CM | POA: Diagnosis present

## 2020-04-30 DIAGNOSIS — K7469 Other cirrhosis of liver: Secondary | ICD-10-CM | POA: Diagnosis present

## 2020-04-30 DIAGNOSIS — Z961 Presence of intraocular lens: Secondary | ICD-10-CM | POA: Diagnosis present

## 2020-04-30 DIAGNOSIS — Z90710 Acquired absence of both cervix and uterus: Secondary | ICD-10-CM

## 2020-04-30 DIAGNOSIS — I440 Atrioventricular block, first degree: Secondary | ICD-10-CM | POA: Diagnosis present

## 2020-04-30 DIAGNOSIS — Z96641 Presence of right artificial hip joint: Secondary | ICD-10-CM | POA: Diagnosis present

## 2020-04-30 DIAGNOSIS — E46 Unspecified protein-calorie malnutrition: Secondary | ICD-10-CM | POA: Diagnosis present

## 2020-04-30 DIAGNOSIS — Z880 Allergy status to penicillin: Secondary | ICD-10-CM

## 2020-04-30 DIAGNOSIS — Z9841 Cataract extraction status, right eye: Secondary | ICD-10-CM

## 2020-04-30 DIAGNOSIS — M8580 Other specified disorders of bone density and structure, unspecified site: Secondary | ICD-10-CM | POA: Diagnosis present

## 2020-04-30 DIAGNOSIS — I110 Hypertensive heart disease with heart failure: Secondary | ICD-10-CM | POA: Diagnosis present

## 2020-04-30 DIAGNOSIS — Z8249 Family history of ischemic heart disease and other diseases of the circulatory system: Secondary | ICD-10-CM

## 2020-04-30 DIAGNOSIS — R0602 Shortness of breath: Secondary | ICD-10-CM | POA: Diagnosis not present

## 2020-04-30 DIAGNOSIS — Z66 Do not resuscitate: Secondary | ICD-10-CM | POA: Diagnosis present

## 2020-04-30 DIAGNOSIS — I5032 Chronic diastolic (congestive) heart failure: Secondary | ICD-10-CM | POA: Diagnosis present

## 2020-04-30 DIAGNOSIS — Z20822 Contact with and (suspected) exposure to covid-19: Secondary | ICD-10-CM | POA: Diagnosis present

## 2020-04-30 DIAGNOSIS — I252 Old myocardial infarction: Secondary | ICD-10-CM

## 2020-04-30 DIAGNOSIS — Z79899 Other long term (current) drug therapy: Secondary | ICD-10-CM

## 2020-04-30 DIAGNOSIS — R7989 Other specified abnormal findings of blood chemistry: Secondary | ICD-10-CM

## 2020-04-30 DIAGNOSIS — I2510 Atherosclerotic heart disease of native coronary artery without angina pectoris: Secondary | ICD-10-CM | POA: Diagnosis present

## 2020-04-30 DIAGNOSIS — Z85048 Personal history of other malignant neoplasm of rectum, rectosigmoid junction, and anus: Secondary | ICD-10-CM

## 2020-04-30 DIAGNOSIS — I10 Essential (primary) hypertension: Secondary | ICD-10-CM | POA: Diagnosis present

## 2020-04-30 LAB — CBC WITH DIFFERENTIAL/PLATELET
Abs Immature Granulocytes: 0.02 10*3/uL (ref 0.00–0.07)
Basophils Absolute: 0 10*3/uL (ref 0.0–0.1)
Basophils Relative: 1 %
Eosinophils Absolute: 0.1 10*3/uL (ref 0.0–0.5)
Eosinophils Relative: 2 %
HCT: 40.6 % (ref 36.0–46.0)
Hemoglobin: 12.8 g/dL (ref 12.0–15.0)
Immature Granulocytes: 0 %
Lymphocytes Relative: 36 %
Lymphs Abs: 2.3 10*3/uL (ref 0.7–4.0)
MCH: 31.7 pg (ref 26.0–34.0)
MCHC: 31.5 g/dL (ref 30.0–36.0)
MCV: 100.5 fL — ABNORMAL HIGH (ref 80.0–100.0)
Monocytes Absolute: 0.4 10*3/uL (ref 0.1–1.0)
Monocytes Relative: 7 %
Neutro Abs: 3.5 10*3/uL (ref 1.7–7.7)
Neutrophils Relative %: 54 %
Platelets: 205 10*3/uL (ref 150–400)
RBC: 4.04 MIL/uL (ref 3.87–5.11)
RDW: 12.9 % (ref 11.5–15.5)
WBC: 6.4 10*3/uL (ref 4.0–10.5)
nRBC: 0 % (ref 0.0–0.2)

## 2020-04-30 LAB — HEPATIC FUNCTION PANEL
ALT: 131 U/L — ABNORMAL HIGH (ref 0–44)
AST: 184 U/L — ABNORMAL HIGH (ref 15–41)
Albumin: 3.5 g/dL (ref 3.5–5.0)
Alkaline Phosphatase: 164 U/L — ABNORMAL HIGH (ref 38–126)
Bilirubin, Direct: 0.2 mg/dL (ref 0.0–0.2)
Indirect Bilirubin: 0.6 mg/dL (ref 0.3–0.9)
Total Bilirubin: 0.8 mg/dL (ref 0.3–1.2)
Total Protein: 7.1 g/dL (ref 6.5–8.1)

## 2020-04-30 LAB — BASIC METABOLIC PANEL
Anion gap: 9 (ref 5–15)
BUN: 33 mg/dL — ABNORMAL HIGH (ref 8–23)
CO2: 20 mmol/L — ABNORMAL LOW (ref 22–32)
Calcium: 8.9 mg/dL (ref 8.9–10.3)
Chloride: 104 mmol/L (ref 98–111)
Creatinine, Ser: 1.06 mg/dL — ABNORMAL HIGH (ref 0.44–1.00)
GFR, Estimated: 47 mL/min — ABNORMAL LOW (ref >60)
Glucose, Bld: 91 mg/dL (ref 70–99)
Potassium: 4.1 mmol/L (ref 3.5–5.1)
Sodium: 133 mmol/L — ABNORMAL LOW (ref 135–145)

## 2020-04-30 LAB — TROPONIN I (HIGH SENSITIVITY)
Troponin I (High Sensitivity): 15 ng/L (ref <18)
Troponin I (High Sensitivity): 16 ng/L (ref <18)

## 2020-04-30 LAB — RESP PANEL BY RT-PCR (FLU A&B, COVID) ARPGX2
Influenza A by PCR: NEGATIVE
Influenza B by PCR: NEGATIVE
SARS Coronavirus 2 by RT PCR: NEGATIVE

## 2020-04-30 LAB — BRAIN NATRIURETIC PEPTIDE: B Natriuretic Peptide: 369.2 pg/mL — ABNORMAL HIGH (ref 0.0–100.0)

## 2020-04-30 LAB — MAGNESIUM: Magnesium: 2.1 mg/dL (ref 1.7–2.4)

## 2020-04-30 MED ORDER — ASPIRIN EC 81 MG PO TBEC
81.0000 mg | DELAYED_RELEASE_TABLET | Freq: Every day | ORAL | Status: DC
  Administered 2020-05-01 – 2020-05-04 (×4): 81 mg via ORAL
  Filled 2020-04-30 (×4): qty 1

## 2020-04-30 MED ORDER — SODIUM CHLORIDE 0.9 % IV BOLUS: 500.0000 mL | Freq: Once | INTRAVENOUS | Status: DC

## 2020-04-30 MED ORDER — HEPARIN SODIUM (PORCINE) 5000 UNIT/ML IJ SOLN
5000.0000 [IU] | Freq: Three times a day (TID) | INTRAMUSCULAR | Status: DC
  Administered 2020-04-30 – 2020-05-04 (×11): 5000 [IU] via SUBCUTANEOUS
  Filled 2020-04-30 (×8): qty 1

## 2020-04-30 MED ORDER — MELATONIN 5 MG PO TABS
5.0000 mg | ORAL_TABLET | Freq: Every day | ORAL | Status: DC
  Administered 2020-05-01 – 2020-05-03 (×3): 5 mg via ORAL
  Filled 2020-04-30 (×5): qty 1

## 2020-04-30 MED ORDER — FUROSEMIDE 10 MG/ML IJ SOLN
40.0000 mg | Freq: Once | INTRAMUSCULAR | Status: AC
  Administered 2020-04-30: 40 mg via INTRAVENOUS
  Filled 2020-04-30: qty 4

## 2020-04-30 MED ORDER — FUROSEMIDE 40 MG PO TABS
40.0000 mg | ORAL_TABLET | Freq: Every day | ORAL | Status: DC
Start: 2020-05-01 — End: 2020-05-04
  Administered 2020-05-01 – 2020-05-04 (×4): 40 mg via ORAL
  Filled 2020-04-30 (×3): qty 1
  Filled 2020-04-30: qty 2

## 2020-04-30 MED ORDER — METOPROLOL TARTRATE 5 MG/5ML IV SOLN: 2.5000 mg | Freq: Four times a day (QID) | INTRAVENOUS | Status: DC | PRN

## 2020-04-30 MED ORDER — METOPROLOL SUCCINATE ER 25 MG PO TB24
12.5000 mg | ORAL_TABLET | Freq: Once | ORAL | Status: AC
  Administered 2020-04-30: 12.5 mg via ORAL
  Filled 2020-04-30: qty 1

## 2020-04-30 MED ORDER — SODIUM CHLORIDE 0.9% FLUSH
3.0000 mL | Freq: Two times a day (BID) | INTRAVENOUS | Status: DC
  Administered 2020-04-30 – 2020-05-03 (×6): 3 mL via INTRAVENOUS

## 2020-04-30 MED ORDER — SODIUM CHLORIDE 0.9 % IV BOLUS: 1000.0000 mL | Freq: Once | INTRAVENOUS | Status: DC

## 2020-04-30 MED ORDER — METOPROLOL SUCCINATE ER 25 MG PO TB24
12.5000 mg | ORAL_TABLET | Freq: Two times a day (BID) | ORAL | Status: DC
  Administered 2020-05-01 – 2020-05-04 (×7): 12.5 mg via ORAL
  Filled 2020-04-30 (×7): qty 1

## 2020-04-30 NOTE — ED Provider Notes (Signed)
[facility] EMERGENCY DEPARTMENT Provider Note   CSN: [id number] Arrival date & time: 04/30/20  1804     History Chief Complaint  Patient presents with  . Shortness of Breath  . Weakness    [name] is a 87 y.o. female.  HPI      [name] is a 87 y.o. female, with a history of CAD, STEMI,, hyperlipidemia, HTN, A. fib, presenting to the ED primarily complaining of generalized weakness worsening over the past week.  Patient lives alone and she called EMS today due to shortness of breath over the last 2 days. EMS reports patient was originally 89% on room air with increased work of breathing.  EKG showed A. fib RVR at a rate of 160.  She received 600 cc IV normal saline bolus and heart rate improved.  Patient was seen by cardiology on March 15.  They opted to resume her amiodarone at 100 mg daily and continue her Toprol 12.5 mg daily.  No anticoagulation due to increased fall risk and age.  Patient denies fever/chills, N/V/D, chest pain, cough, abdominal pain, urinary symptoms, syncope, falls/trauma, or any other complaints.   Past Medical History:  Diagnosis Date  . Arthritis    FINGERS   . CAD (coronary artery disease) CARDIOLOGIST-- DR Angelena Form   a. s/p INF STEMI 7/12: tx with BMS to RCA;  b. cath 08/26/10: pLAD 30%, mLAD 50%, D1 40%, pCFX 95%, mRCA occluded;   c. staged PCI of pCFX with BMS;   d. echo 7/12:   EF 60-65%, mild RAE, mild to moderate AI, mild MR, moderate TR, RVE, PASP 47  . Complication of anesthesia PT STATES "MADE HER FEEL CRAZY"  . Degeneration of eye    left eye cornea  . Dyspnea   . First degree heart block   . Heart palpitations PAC'S AND SVT RUN'S  PER CARDIOLOGIST NOTE  . History of ST elevation myocardial infarction (STEMI) 08-26-2010-- INFERIOR WALL   S/P PCI  BMS IN RCA AND PROX. CX  . Hyperlipidemia   . Hypertension   . Impaired hearing BILATERAL HEARING AIDS  . Osteopenia   . PAF (paroxysmal atrial  fibrillation) ([place])   . PONV (postoperative nausea and vomiting)   . Pulmonary nodules BENIGN  PER CT  10-12-2010  . Rectal Cancer 08/2010   adenocarcinoma   S/P PARTIAL PROCTECTOMY (NO CHEMO OR RADIATION)  . 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 S/P coronary artery stent placement 08/2010   X2  BM    Patient Active Problem List   Diagnosis Date Noted  . Atrial flutter ([place]) 04/27/2020  . Secondary hypercoagulable state ([place]) 04/27/2020  . Closed fracture of multiple pubic rami, left, initial encounter ([place]) 09/06/2019  . Slurred speech 02/23/2017  . PAF (paroxysmal atrial fibrillation) ([place]) 02/23/2017  . Depression with anxiety 02/23/2017  . TIA (transient ischemic attack)   . Fracture, tibia and fibula 07/06/2015  . Tibia/fibula fracture 07/03/2015  . Lung nodule seen on imaging study 09/05/2013  . Exposure to TB 09/05/2013  . SOB (shortness of breath) 10/02/2011  . Vascular skin changes 07/17/2011  . 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 Anorexia post-op 12/08/2010  . HTN (hypertension) 11/03/2010  . Dizziness 09/15/2010  . CAD (coronary artery disease)   . Hyperlipidemia   . Rectal cancer, pT2uN0(pNX) s/p TEM partial proctectomy 09/07/2010    Past  Surgical History:  Procedure Laterality Date  . ABDOMINAL HYSTERECTOMY  1950's   AND APPENDECTOMY  . CATARACT EXTRACTION W/ INTRAOCULAR LENS  IMPLANT, BILATERAL    . CORONARY ANGIOPLASTY WITH STENT PLACEMENT  08-26-2010  DR [place]   PCI, BM STENT IN RCA  . CORONARY ANGIOPLASTY WITH STENT PLACEMENT  08-29-2010  DR [place]   PCI, BM STENT IN PROXIMAL CIRCUMFLEX  . EXCISION BENIGN CYST RIGHT BREAST    . FISTULA PLUG N/A 06/21/2012   Procedure: insertion of FISTULA PLUG;  Surgeon: Leighton Ruff, MD;  Location: [facility];  Service: General;  Laterality: N/A;  . FLEXIBLE SIGMOIDOSCOPY N/A  04/02/2012   Procedure: FLEXIBLE SIGMOIDOSCOPY;  Surgeon: Leighton Ruff, MD;  Location: [facility];  Service: Endoscopy;  Laterality: N/A;  . KNEE SURGERY Right 1996  . LAPROSCOPY LYSIS ADHESIONS/ DRAINAGE OF PELVIC ABSCESS/ DIVERTING LOOP SIGMOID COLECTOMY  11-22-2010   POST OP RECTOVAGINAL FISTULA  . PARTIAL PROCTECTOMY BY TEM  11-17-2010   RECTAL CANCER  . RELEASE LEFT CARPAL TUNNEL/ OSTEOTOMY LEFT DISTAL RADIUS  11-10-2009  . STAPEDECTOMY  1970'S  . TONSILLECTOMY  CHILD  . TOTAL HIP ARTHROPLASTY Right 1992  . TRANSTHORACIC ECHOCARDIOGRAM  10-10-2011   NORMAL LV SIZE WITH MILD FOCAL BASAL SEPTAL HYPERTROPHY/ EF 55-60%/ NORMAL RV SIZE AND LVSF/ BIATRIAL ENLARGEMENT/ MILD TO MODERATE AI  &  TR  . TYMPANOPLASTY Left 12-23-2009     OB History   No obstetric history on file.     Family History  Problem Relation Age of Onset  . Kidney disease Mother   . Heart disease Father   . Cancer Brother     Social History   Tobacco Use  . Smoking status: Never Smoker  . Smokeless tobacco: Never Used  Substance Use Topics  . Alcohol use: No  . Drug use: No    Home Medications Prior to Admission medications   Medication Sig Start Date End Date Taking? Authorizing Provider  acetaminophen (TYLENOL) 500 MG tablet Take 2 tablets (1,000 mg total) by mouth every 6 (six) hours as needed for mild pain. 07/06/15   Rama, Venetia Maxon, MD  amiodarone (PACERONE) 200 MG tablet Take 0.5 tablets (100 mg total) by mouth daily. 04/27/20   Fenton, Clint R, PA  aspirin EC 81 MG tablet Take 81 mg by mouth daily.    [provider]  beta carotene w/minerals (OCUVITE) tablet Take 1 tablet by mouth daily.    [provider]  BIOTIN PO Take 1 tablet by mouth daily.     [provider]  Cholecalciferol (VITAMIN D) 50 MCG (2000 UT) tablet Take 2,000 Units by mouth daily.    [provider]  enoxaparin (LOVENOX) 30 MG/0.3ML injection Inject 0.3 mLs (30 mg total) into the skin  daily for 14 days. 09/12/19 09/26/19  Wyvonnia Dusky, MD  furosemide (LASIX) 40 MG tablet Take 40 mg by mouth daily. Per patient taking 1/2 tablet    [provider]  melatonin 5 MG TABS Take 5 mg by mouth at bedtime.    [provider]  metoprolol succinate (TOPROL XL) 25 MG 24 hr tablet Take 0.5 tablets (12.5 mg total) by mouth at bedtime. 04/27/20   Fenton, Clint R, PA  nitroGLYCERIN (NITROSTAT) 0.4 MG SL tablet Place 0.4 mg under the tongue every 5 (five) minutes as needed for chest pain.    [provider]  Polyethyl Glycol-Propyl Glycol (SYSTANE) 0.4-0.3 % GEL ophthalmic gel Place 1 drop into both  eyes at bedtime.     [provider]  sertraline (ZOLOFT) 25 MG tablet  10/28/19   [provider]    Allergies    Effexor [venlafaxine hydrochloride], Penicillins, Sulfa antibiotics, Dicyclomine, Lactose intolerance (gi), and Hydrocodone  Review of Systems   Review of Systems  Constitutional: Negative for chills, diaphoresis and fever.  Respiratory: Positive for shortness of breath. Negative for cough.   Cardiovascular: Negative for chest pain and leg swelling.  Gastrointestinal: Negative for abdominal pain, constipation, diarrhea, nausea and vomiting.  Genitourinary: Negative for difficulty urinating, dysuria and frequency.  Musculoskeletal: Negative for back pain.  Neurological: Positive for weakness. Negative for syncope and headaches.    Physical Exam Updated Vital Signs BP 128/66   Pulse 97   Temp 98.3 F (36.8 C) (Rectal)   Resp 19   SpO2 98%   Physical Exam Vitals and nursing note reviewed.  Constitutional:      General: She is not in acute distress.    Appearance: She is well-developed. She is not diaphoretic.  HENT:     Head: Normocephalic and atraumatic.     Mouth/Throat:     Mouth: Mucous membranes are moist.     Pharynx: Oropharynx is clear.  Eyes:     Conjunctiva/sclera: Conjunctivae normal.  Cardiovascular:      Rate and Rhythm: Normal rate. Rhythm irregularly irregular.     Pulses: Normal pulses.          Radial pulses are 2+ on the right side and 2+ on the left side.       Posterior tibial pulses are 2+ on the right side and 2+ on the left side.     Heart sounds: Normal heart sounds.     Comments: Tactile temperature in the extremities appropriate and equal bilaterally. Pulmonary:     Effort: Tachypnea present.     Breath sounds: Normal breath sounds.  Abdominal:     Palpations: Abdomen is soft.     Tenderness: There is no abdominal tenderness. There is no guarding.  Musculoskeletal:     Cervical back: Neck supple.     Right lower leg: No edema.     Left lower leg: No edema.  Skin:    General: Skin is warm and dry.  Neurological:     Mental Status: She is alert and oriented to person, place, and time.     Comments: Patient is quite generally weak.  She has difficulty even sitting upright in bed.  She denies any dizziness, rather states she is just too weak to sit upright.  Psychiatric:        Mood and Affect: Mood and affect normal.        Speech: Speech normal.        Behavior: Behavior normal.     ED Results / Procedures / Treatments   Labs (all labs ordered are listed, but only abnormal results are displayed) Labs Reviewed  CBC WITH DIFFERENTIAL/PLATELET - Abnormal; Notable for the following components:      Result Value   MCV 100.5 (*)    All other components within normal limits  BASIC METABOLIC PANEL - Abnormal; Notable for the following components:   Sodium 133 (*)    CO2 20 (*)    BUN 33 (*)    Creatinine, Ser 1.06 (*)    GFR, Estimated 47 (*)    All other components within normal limits  HEPATIC FUNCTION PANEL - Abnormal; Notable for the following components:   AST  184 (*)    ALT 131 (*)    Alkaline Phosphatase 164 (*)    All other components within normal limits  BRAIN NATRIURETIC PEPTIDE - Abnormal; Notable for the following components:   B Natriuretic Peptide  369.2 (*)    All other components within normal limits  RESP PANEL BY RT-PCR (FLU A&B, COVID) ARPGX2  MAGNESIUM  URINALYSIS, ROUTINE W REFLEX MICROSCOPIC  TROPONIN I (HIGH SENSITIVITY)  TROPONIN I (HIGH SENSITIVITY)    EKG EKG Interpretation  Date/Time:  Friday April 30 2020 19:51:49 EDT Ventricular Rate:  88 PR Interval:    QRS Duration: 135 QT Interval:  363 QTC Calculation: 440 R Axis:   -69 Text Interpretation: Atrial fibrillation Nonspecific IVCD with LAD Left ventricular hypertrophy Anterior infarct, old Confirmed by Lennice Sites [phone]) on 04/30/2020 8:00:16 PM   Radiology DG Chest Portable 1 View  Result Date: 04/30/2020 CLINICAL DATA:  Fatigue EXAM: PORTABLE CHEST 1 VIEW COMPARISON:  04/13/2020 FINDINGS: There is hyperinflation of the lungs compatible with COPD. Cardiomegaly. Patchy airspace disease throughout the right lung. Small right pleural effusion. No confluent opacity on the left. Areas of scarring bilaterally. No acute bony abnormality. IMPRESSION: Cardiomegaly.  COPD. Airspace disease in the right lower lobe with small right effusion. Findings concerning for pneumonia. Electronically Signed   By: Rolm Baptise M.D.   On: 04/30/2020 19:04    Procedures Procedures   Medications Ordered in ED Medications  furosemide (LASIX) injection 40 mg (has no administration in time range)  metoprolol succinate (TOPROL-XL) 24 hr tablet 12.5 mg (12.5 mg Oral Given 04/30/20 2018)    ED Course  I have reviewed the triage vital signs and the nursing notes.  Pertinent labs & imaging results that were available during my care of the patient were reviewed by me and considered in my medical decision making (see chart for details).  Clinical Course as of 04/30/20 2158  Fri Apr 30, 2020  2156 Dr. Ronnald Nian spoke Dr. Posey Pronto, hospitalist, who agrees to admit the patient. [SJ]    Clinical Course User Index [SJ] Katrese Shell, Helane Gunther, PA-C   MDM Rules/Calculators/A&P                           Patient presents with shortness of breath and generalized weakness. A. fib RVR with EMS, improved by ED arrival, however, persistent tachypnea and increased work of breathing.  Afebrile.  Requiring O2 due to tachypnea and intermittent hypoxia.  Elevations in AST, ALT, alk phos of uncertain significance at this time.  No right upper quadrant or other abdominal pain. We closely reviewed the patient's chest x-ray.  We have a lower suspicion for acute infection in this patient as she has had symptoms for at least several days and has no fever, leukocytosis, or cough.   Findings and plan of care discussed with attending physician, Lennice Sites, DO. Dr. Ronnald Nian personally evaluated and examined this patient.  Final Clinical Impression(s) / ED Diagnoses Final diagnoses:  Generalized weakness  Atrial fibrillation with RVR [facility])    Rx / DC Orders ED Discharge Orders    None       Layla Maw 04/30/20 2159    Lennice Sites, DO 04/30/20 2308

## 2020-04-30 NOTE — ED Provider Notes (Addendum)
I personally evaluated the patient during the encounter and completed a history, physical, procedures, medical decision making to contribute to the overall care of the patient and decision making for the patient briefly, the patient is a 87 y.o. female high cholesterol, hypertension, A. fib not on blood thinners who presents the ED with generalized weakness, shortness of breath.  Appears volume overloaded on exam.  Rales on exam, edema in her legs.  A. fib with RVR with EMS in the 160s.  They gave 500 L bolus with improvement of heart rate to the low 100s.  She was hypoxic with them but she appears to be without hypoxia here.  She is has some fatigue during this time.  She recently saw cardiology at A. fib clinic and they started her on amiodarone to go along with her Lopressor.  Suspect that A. fib is causing fatigue and may be some heart failure symptoms.  No fever.  Less likely sepsis or infectious process.  Will check labs and may need to be admitted for some diuresis or better heart rate control.  We will continue to help with disposition.  Please see PAs note for further results, evaluation, disposition of the patient.  EKG shows atrial fibrillation with heart rate 88.   Lennice Sites, DO 04/30/20 [place], [place], DO 04/30/20 2125

## 2020-04-30 NOTE — H&P (Signed)
History and Physical    [name] IHK:[id number] DOB: May 08, 1916 DOA: 04/30/2020  PCP: Leonard Downing, MD  Patient coming from: Home via EMS  I have personally briefly reviewed patient's old medical records in [place]  Chief Complaint: Shortness of breath, generalized weakness  HPI: [name] is a 87 y.o. female with medical history significant for paroxysmal atrial fibrillation/flutter not on anticoagulation, chronic diastolic CHF (EF 75-64%, G3 DD by TTE 02/23/2017), CAD s/p PCI with BMS x2, first-degree AV block, rectal cancer s/p proctectomy with colostomy, hypertension who presents to the ED for evaluation of shortness of breath and generalized weakness.  Patient states she has been feeling generally weak and fatigued over the last several weeks.  She has chronic shortness of breath which has worsened over the last few days with orthopnea, DOE.  She says she likes to cook and wanted to bake a pie but did not have any energy to do so.  She reports occasional palpitations but denies any chest pain, cough, abdominal pain, nausea, vomiting.  She reports decreased urine output than expected without dysuria.  She has a colostomy in place and reports fair bowel movements.  ED Course:  Initial vitals showed BP 128/66, pulse 133, RR 19, temp 97.6 F, SPO2 98% on room air.  Labs show WBC 6.4, hemoglobin 12.8, platelets 205,000, sodium 133, potassium 4.1, bicarb 20, BUN 33, creatinine 1.06, GFR 47, serum glucose 91, magnesium 2.1, AST 184, ALT 131, alk phos 164, total bilirubin 0.8, BNP 369.2, high-sensitivity troponin I 15 > 16.  SARS-CoV-2 PCR is negative.  Influenza A/B PCR's are negative.  Portable chest x-ray shows hyperinflated lungs, enlarged cardiac silhouette, right lower lobe airspace disease with small right effusion.  Patient was given IV Lasix 40 mg once and oral Toprol-XL 12.5 mg.  The hospitalist service was consulted to admit for further evaluation and  management.  Review of Systems: All systems reviewed and are negative except as documented in history of present illness above.   Past Medical History:  Diagnosis Date  . Arthritis    FINGERS   . CAD (coronary artery disease) CARDIOLOGIST-- DR Angelena Form   a. s/p INF STEMI 7/12: tx with BMS to RCA;  b. cath 08/26/10: pLAD 30%, mLAD 50%, D1 40%, pCFX 95%, mRCA occluded;   c. staged PCI of pCFX with BMS;   d. echo 7/12:   EF 60-65%, mild RAE, mild to moderate AI, mild MR, moderate TR, RVE, PASP 47  . Complication of anesthesia PT STATES "MADE HER FEEL CRAZY"  . Degeneration of eye    left eye cornea  . Dyspnea   . First degree heart block   . Heart palpitations PAC'S AND SVT RUN'S  PER CARDIOLOGIST NOTE  . History of ST elevation myocardial infarction (STEMI) 08-26-2010-- INFERIOR WALL   S/P PCI  BMS IN RCA AND PROX. CX  . Hyperlipidemia   . Hypertension   . Impaired hearing BILATERAL HEARING AIDS  . Osteopenia   . PAF (paroxysmal atrial fibrillation) ([place])   . PONV (postoperative nausea and vomiting)   . Pulmonary nodules BENIGN  PER CT  10-12-2010  . Rectal Cancer 08/2010   adenocarcinoma   S/P PARTIAL PROCTECTOMY (NO CHEMO OR RADIATION)  . 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 S/P coronary artery stent placement 08/2010   X2  BM    Past Surgical History:  Procedure Laterality Date  .  ABDOMINAL HYSTERECTOMY  1950's   AND APPENDECTOMY  . CATARACT EXTRACTION W/ INTRAOCULAR LENS  IMPLANT, BILATERAL    . CORONARY ANGIOPLASTY WITH STENT PLACEMENT  08-26-2010  DR [place]   PCI, BM STENT IN RCA  . CORONARY ANGIOPLASTY WITH STENT PLACEMENT  08-29-2010  DR [place]   PCI, BM STENT IN PROXIMAL CIRCUMFLEX  . EXCISION BENIGN CYST RIGHT BREAST    . FISTULA PLUG N/A 06/21/2012   Procedure: insertion of FISTULA PLUG;  Surgeon: Leighton Ruff, MD;  Location: [facility];  Service: General;   Laterality: N/A;  . FLEXIBLE SIGMOIDOSCOPY N/A 04/02/2012   Procedure: FLEXIBLE SIGMOIDOSCOPY;  Surgeon: Leighton Ruff, MD;  Location: [facility];  Service: Endoscopy;  Laterality: N/A;  . KNEE SURGERY Right 1996  . LAPROSCOPY LYSIS ADHESIONS/ DRAINAGE OF PELVIC ABSCESS/ DIVERTING LOOP SIGMOID COLECTOMY  11-22-2010   POST OP RECTOVAGINAL FISTULA  . PARTIAL PROCTECTOMY BY TEM  11-17-2010   RECTAL CANCER  . RELEASE LEFT CARPAL TUNNEL/ OSTEOTOMY LEFT DISTAL RADIUS  11-10-2009  . STAPEDECTOMY  1970'S  . TONSILLECTOMY  CHILD  . TOTAL HIP ARTHROPLASTY Right 1992  . TRANSTHORACIC ECHOCARDIOGRAM  10-10-2011   NORMAL LV SIZE WITH MILD FOCAL BASAL SEPTAL HYPERTROPHY/ EF 55-60%/ NORMAL RV SIZE AND LVSF/ BIATRIAL ENLARGEMENT/ MILD TO MODERATE AI  &  TR  . TYMPANOPLASTY Left 12-23-2009    Social History:  reports that she has never smoked. She has never used smokeless tobacco. She reports that she does not drink alcohol and does not use drugs.  Allergies  Allergen Reactions  . Effexor [Venlafaxine Hydrochloride] Other (See Comments)    Reaction not recalled by the patient  . Penicillins Shortness Of Breath    Has patient had a PCN reaction causing immediate rash, facial/tongue/throat swelling, SOB or lightheadedness with hypotension: Yes Has patient had a PCN reaction causing severe rash involving mucus membranes or skin necrosis: No Has patient had a PCN reaction that required hospitalization: No Has patient had a PCN reaction occurring within the last 10 years: No If all of the above answers are "NO", then may proceed with Cephalosporin use.   . Sulfa Antibiotics Nausea Only  . Dicyclomine Other (See Comments)    Caused confusion  . Lactose Intolerance (Gi) Diarrhea  . Hydrocodone Nausea Only    Family History  Problem Relation Age of Onset  . Kidney disease Mother   . Heart disease Father   . Cancer Brother      Prior to Admission medications   Medication Sig Start Date End Date  Taking? Authorizing Provider  acetaminophen (TYLENOL) 500 MG tablet Take 2 tablets (1,000 mg total) by mouth every 6 (six) hours as needed for mild pain. 07/06/15   Rama, Venetia Maxon, MD  amiodarone (PACERONE) 200 MG tablet Take 0.5 tablets (100 mg total) by mouth daily. 04/27/20   Fenton, Clint R, PA  aspirin EC 81 MG tablet Take 81 mg by mouth daily.    [provider]  beta carotene w/minerals (OCUVITE) tablet Take 1 tablet by mouth daily.    [provider]  BIOTIN PO Take 1 tablet by mouth daily.     [provider]  Cholecalciferol (VITAMIN D) 50 MCG (2000 UT) tablet Take 2,000 Units by mouth daily.    [provider]  enoxaparin (LOVENOX) 30 MG/0.3ML injection Inject 0.3 mLs (30 mg total) into the skin daily for 14 days. 09/12/19 09/26/19  Wyvonnia Dusky, MD  furosemide (LASIX) 40 MG tablet Take 40  mg by mouth daily. Per patient taking 1/2 tablet    [provider]  melatonin 5 MG TABS Take 5 mg by mouth at bedtime.    [provider]  metoprolol succinate (TOPROL XL) 25 MG 24 hr tablet Take 0.5 tablets (12.5 mg total) by mouth at bedtime. 04/27/20   Fenton, Clint R, PA  nitroGLYCERIN (NITROSTAT) 0.4 MG SL tablet Place 0.4 mg under the tongue every 5 (five) minutes as needed for chest pain.    [provider]  Polyethyl Glycol-Propyl Glycol (SYSTANE) 0.4-0.3 % GEL ophthalmic gel Place 1 drop into both eyes at bedtime.     [provider]  sertraline (ZOLOFT) 25 MG tablet  10/28/19   [provider]    Physical Exam: Vitals:   04/30/20 2056 04/30/20 2100 04/30/20 2126 04/30/20 2130  BP: 124/60 121/60  115/66  Pulse: (!) 106 67 64 (!) 113  Resp: (!) 21 19 (!) 24 (!) 26  Temp:      TempSrc:      SpO2: 100% 100% 100% 100%   Constitutional: Elderly woman resting in bed, appears tired but in NAD, calm, comfortable Eyes: PERRL, lids and conjunctivae normal ENMT: Mucous membranes are moist. Posterior pharynx  clear of any exudate or lesions.Normal dentition.  Neck: normal, supple, no masses. Respiratory: Inspiratory crackles left lung base, normal respiratory effort. No accessory muscle use.  Cardiovascular: Irregularly irregular, tachycardic, no murmurs / rubs / gallops. No extremity edema. 2+ pedal pulses. Abdomen: Colostomy in place left abdomen, no tenderness, no masses palpated. Bowel sounds positive.  Musculoskeletal: no clubbing / cyanosis. No joint deformity upper and lower extremities. Good ROM, no contractures. Normal muscle tone.  Skin: no rashes, lesions, ulcers. No induration Neurologic: CN 2-12 grossly intact. Sensation intact. Strength 5/5 in all 4.  Psychiatric: Normal judgment and insight. Alert and oriented x 3. Normal mood.   Labs on Admission: I have personally reviewed following labs and imaging studies  CBC: Recent Labs  Lab 04/30/20 1822  WBC 6.4  NEUTROABS 3.5  HGB 12.8  HCT 40.6  MCV 100.5*  PLT 696   Basic Metabolic Panel: Recent Labs  Lab 04/30/20 1822  NA 133*  K 4.1  CL 104  CO2 20*  GLUCOSE 91  BUN 33*  CREATININE 1.06*  CALCIUM 8.9  MG 2.1   GFR: Estimated Creatinine Clearance: 20.3 mL/min (A) (by C-G formula based on SCr of 1.06 mg/dL (H)). Liver Function Tests: Recent Labs  Lab 04/30/20 1822  AST 184*  ALT 131*  ALKPHOS 164*  BILITOT 0.8  PROT 7.1  ALBUMIN 3.5   No results for input(s): LIPASE, AMYLASE in the last 168 hours. No results for input(s): AMMONIA in the last 168 hours. Coagulation Profile: No results for input(s): INR, PROTIME in the last 168 hours. Cardiac Enzymes: No results for input(s): CKTOTAL, CKMB, CKMBINDEX, TROPONINI in the last 168 hours. BNP (last 3 results) No results for input(s): PROBNP in the last 8760 hours. HbA1C: No results for input(s): HGBA1C in the last 72 hours. CBG: No results for input(s): GLUCAP in the last 168 hours. Lipid Profile: No results for input(s): CHOL, HDL, LDLCALC, TRIG, CHOLHDL,  LDLDIRECT in the last 72 hours. Thyroid Function Tests: No results for input(s): TSH, T4TOTAL, FREET4, T3FREE, THYROIDAB in the last 72 hours. Anemia Panel: No results for input(s): VITAMINB12, FOLATE, FERRITIN, TIBC, IRON, RETICCTPCT in the last 72 hours. Urine analysis:    Component Value Date/Time   COLORURINE YELLOW 04/13/2020 1236  APPEARANCEUR CLEAR 04/13/2020 1236   LABSPEC 1.010 04/13/2020 1236   PHURINE 5.0 04/13/2020 1236   GLUCOSEU NEGATIVE 04/13/2020 1236   HGBUR NEGATIVE 04/13/2020 [place] 04/13/2020 1236   [place] 04/13/2020 1236   PROTEINUR NEGATIVE 04/13/2020 1236   UROBILINOGEN 0.2 10/01/2014 1628   NITRITE NEGATIVE 04/13/2020 1236   LEUKOCYTESUR NEGATIVE 04/13/2020 1236    Radiological Exams on Admission: DG Chest Portable 1 View  Result Date: 04/30/2020 CLINICAL DATA:  Fatigue EXAM: PORTABLE CHEST 1 VIEW COMPARISON:  04/13/2020 FINDINGS: There is hyperinflation of the lungs compatible with COPD. Cardiomegaly. Patchy airspace disease throughout the right lung. Small right pleural effusion. No confluent opacity on the left. Areas of scarring bilaterally. No acute bony abnormality. IMPRESSION: Cardiomegaly.  COPD. Airspace disease in the right lower lobe with small right effusion. Findings concerning for pneumonia. Electronically Signed   By: Rolm Baptise M.D.   On: 04/30/2020 19:04    EKG: Personally reviewed.  Initial EKG shows A. fib with rate 118, repeat EKG shows atrial fibrillation, rate 88, nonspecific IVCD.  Prior EKG showed sinus rhythm with first-degree AV block.  Assessment/Plan Principal Problem:   Paroxysmal atrial fibrillation with rapid ventricular response (HCC) Active Problems:   CAD (coronary artery disease)   HTN (hypertension)   Acute on chronic diastolic CHF (congestive heart failure) (HCC)   Elevated LFTs   AKI (acute kidney injury) ([place])   [name] is a 87 y.o. female with medical history  significant for paroxysmal atrial fibrillation/flutter not on anticoagulation, chronic diastolic CHF (EF 71-24%, G3 DD by TTE 02/23/2017), CAD s/p PCI with BMS x2, first-degree AV block, rectal cancer s/p proctectomy with colostomy, hypertension who is admitted with A. fib with RVR and acute on chronic diastolic CHF.  Paroxysmal atrial fibrillation/flutter with RVR: Rates improving but still higher than goal.  Will increase home Toprol-XL 12.5 mg daily to twice daily.  IV metoprolol if needed.  Not on full dose anticoagulation due to age and risk of bleeding.  Hold amiodarone with elevated LFTs.  Continue aspirin.  Acute on chronic diastolic CHF: Presenting with DOE, orthopnea.  BNP mildly elevated.  CXR with small right pleural effusion.  Doubt pneumonia without infectious symptoms/signs. -S/p IV Lasix 40 mg once in the ED -Plan to resume home oral Lasix 40 mg daily tomorrow, redose IV Lasix if needed -Strict I/O's and daily weights  Elevated LFTs: First noted on labs about 2 weeks ago, remains elevated but slightly improved from prior.  Denies any abdominal pain.  Check RUQ ultrasound.  Hold amiodarone.  Acute kidney injury: GFR down to 47 compared to baseline >60.  Likely related to A. fib with RVR and CHF.  Repeat labs tomorrow after receiving diuresis.  CAD s/p PCI with BMS x2: Chronic and stable, denies chest pain.  Continue aspirin.  Hypertension: Stable, continue Toprol-XL.  History of rectal cancer and rectovaginal fistula s/p proctectomy with colostomy: Continue colostomy care.  Generalized weakness: Lives alone, ambulates with the use of a walker.  PT eval.  DVT prophylaxis: Subcutaneous heparin Code Status: DNR, confirmed with patient Family Communication: Discussed with patient, she has discussed with family Disposition Plan: From home and likely discharge to home pending clinical progress Consults called: None Level of care: Telemetry Cardiac Admission status:  Status  is: Observation  The patient remains OBS appropriate and will d/c before 2 midnights.  Dispo: The patient is from: Home  Anticipated d/c is to: Home              Patient currently is not medically stable to d/c.   Difficult to place patient No   Zada Finders MD [facility]  If 7PM-7AM, please contact night-coverage [email or web address]  04/30/2020, 10:02 PM

## 2020-04-30 NOTE — ED Triage Notes (Signed)
Pt to ED via EMS from home c/o generalized weakness increasing over the past couple weeks, also c/o [facility] that started 2 days ago. Med HX: A Fib, On EMS arrival pt ekg showed AFIB RVR 90-160 BPM, 600ML NS Bolus given by EMS, current HR 120 Irregular. On EMS arrival o2 89%, placed on 4L 95%,  Last VS: 148/76, Temp 97.1 cbg 113. Yellow DNR Form accompanies pt

## 2020-05-01 ENCOUNTER — Observation Stay (HOSPITAL_BASED_OUTPATIENT_CLINIC_OR_DEPARTMENT_OTHER): Payer: Medicare PPO

## 2020-05-01 DIAGNOSIS — I428 Other cardiomyopathies: Secondary | ICD-10-CM | POA: Diagnosis not present

## 2020-05-01 DIAGNOSIS — K7469 Other cirrhosis of liver: Secondary | ICD-10-CM

## 2020-05-01 DIAGNOSIS — I480 Paroxysmal atrial fibrillation: Secondary | ICD-10-CM | POA: Diagnosis not present

## 2020-05-01 LAB — HEPATIC FUNCTION PANEL
ALT: 167 U/L — ABNORMAL HIGH (ref 0–44)
AST: 149 U/L — ABNORMAL HIGH (ref 15–41)
Albumin: 3.3 g/dL — ABNORMAL LOW (ref 3.5–5.0)
Alkaline Phosphatase: 175 U/L — ABNORMAL HIGH (ref 38–126)
Bilirubin, Direct: 0.2 mg/dL (ref 0.0–0.2)
Indirect Bilirubin: 0.5 mg/dL (ref 0.3–0.9)
Total Bilirubin: 0.7 mg/dL (ref 0.3–1.2)
Total Protein: 7.1 g/dL (ref 6.5–8.1)

## 2020-05-01 LAB — CBC
HCT: 35.3 % — ABNORMAL LOW (ref 36.0–46.0)
Hemoglobin: 11.3 g/dL — ABNORMAL LOW (ref 12.0–15.0)
MCH: 31.2 pg (ref 26.0–34.0)
MCHC: 32 g/dL (ref 30.0–36.0)
MCV: 97.5 fL (ref 80.0–100.0)
Platelets: 228 10*3/uL (ref 150–400)
RBC: 3.62 MIL/uL — ABNORMAL LOW (ref 3.87–5.11)
RDW: 13.1 % (ref 11.5–15.5)
WBC: 6.4 10*3/uL (ref 4.0–10.5)
nRBC: 0 % (ref 0.0–0.2)

## 2020-05-01 LAB — BASIC METABOLIC PANEL
Anion gap: 8 (ref 5–15)
BUN: 32 mg/dL — ABNORMAL HIGH (ref 8–23)
CO2: 25 mmol/L (ref 22–32)
Calcium: 8.7 mg/dL — ABNORMAL LOW (ref 8.9–10.3)
Chloride: 103 mmol/L (ref 98–111)
Creatinine, Ser: 1.02 mg/dL — ABNORMAL HIGH (ref 0.44–1.00)
GFR, Estimated: 49 mL/min — ABNORMAL LOW (ref >60)
Glucose, Bld: 95 mg/dL (ref 70–99)
Potassium: 4.2 mmol/L (ref 3.5–5.1)
Sodium: 136 mmol/L (ref 135–145)

## 2020-05-01 LAB — ECHOCARDIOGRAM COMPLETE
Area-P 1/2: 5.54 cm2
P 1/2 time: 434 msec
S' Lateral: 2.2 cm

## 2020-05-01 LAB — MAGNESIUM: Magnesium: 2 mg/dL (ref 1.7–2.4)

## 2020-05-01 LAB — TSH: TSH: 6.476 u[IU]/mL — ABNORMAL HIGH (ref 0.350–4.500)

## 2020-05-01 MED ORDER — DM-GUAIFENESIN ER 30-600 MG PO TB12
1.0000 | ORAL_TABLET | Freq: Two times a day (BID) | ORAL | Status: DC | PRN
  Filled 2020-05-01: qty 1

## 2020-05-01 MED ORDER — ACETAMINOPHEN 325 MG PO TABS: 650.0000 mg | ORAL_TABLET | Freq: Four times a day (QID) | ORAL | Status: DC | PRN

## 2020-05-01 MED ORDER — IPRATROPIUM-ALBUTEROL 0.5-2.5 (3) MG/3ML IN SOLN: 3.0000 mL | RESPIRATORY_TRACT | Status: DC | PRN

## 2020-05-01 MED ORDER — SERTRALINE HCL 25 MG PO TABS
25.0000 mg | ORAL_TABLET | Freq: Every day | ORAL | Status: DC
  Administered 2020-05-01 – 2020-05-03 (×3): 25 mg via ORAL
  Filled 2020-05-01 (×3): qty 1

## 2020-05-01 MED ORDER — OXYCODONE HCL 5 MG PO TABS
5.0000 mg | ORAL_TABLET | ORAL | Status: DC | PRN
  Administered 2020-05-01 – 2020-05-03 (×3): 5 mg via ORAL
  Filled 2020-05-01 (×3): qty 1

## 2020-05-01 MED ORDER — SALINE SPRAY 0.65 % NA SOLN
2.0000 | NASAL | Status: DC | PRN
  Filled 2020-05-01: qty 44

## 2020-05-01 NOTE — Progress Notes (Signed)
PT Cancellation Note  Patient Details Name: [name] MRN: [id number] DOB: Jul 29, 1916   Cancelled Treatment:    Reason Eval/Treat Not Completed: Patient at procedure or test/unavailable. Pt getting ECHO. Will follow-up as time permits.   Moishe Spice, PT, DPT Acute Rehabilitation Services  Pager: [phone] Office: [place] 05/01/2020, 4:37 PM

## 2020-05-01 NOTE — ED Notes (Signed)
Attempted to call report to 4E.

## 2020-05-01 NOTE — Progress Notes (Signed)
Situation: Chaplain responding to verbal request from pt while walking through ED.   Background: Facts: Pt [name] was in a bed in the hallway of the ED and shared that she was waiting for a room.  Feelings: [name] shared that she is feeling tired and expressed a desire to get a room. 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  Actions & Assessments: [name] seems desperate to get a room where she can rest.  Chaplain offered prayers and words of empathy and reassurance.  Recommendations: Chaplain remains available for follow-up spiritual/emotional support as needed.  Rev. Susanne Borders, MDiv      05/01/20 1600  Clinical Encounter Type  Visited With Patient  Visit Type Initial;Spiritual support  Referral From Patient  Spiritual Encounters  Spiritual Needs Prayer;Emotional

## 2020-05-01 NOTE — Progress Notes (Signed)
PROGRESS NOTE    [name]  QPY:[id number] DOB: 22-Feb-1916 DOA: 04/30/2020 PCP: Leonard Downing, MD   Brief Narrative:  38 with past medical history of paroxysmal A. fib not on AC, diastolic CHF EF 60 to 12%, grade 3 DD, CAD status post PCI with BMS 6 2, first-degree AV block, rectal cancer status post proctectomy with colostomy, HTN admitted for shortness of breath and generalized weakness.  Ongoing for past several weeks with chronic dyspnea on exertion.  Upon admission noted to be in A. fib with RVR and volume overload.   Assessment & Plan:   Principal Problem:   Paroxysmal atrial fibrillation with rapid ventricular response (HCC) Active Problems:   CAD (coronary artery disease)   HTN (hypertension)   Acute on chronic diastolic CHF (congestive heart failure) (HCC)   Elevated LFTs   AKI (acute kidney injury) ([place])    Paroxysmal atrial fibrillation/flutter with RVR: -Not on anticoagulation due to risk of falling.  Per last cardiology note, amiodarone was stopped 3 months ago but then resumed about a week ago, 100 mg orally daily -Home Toprol increased to 12.5 mg twice daily.  Closely monitor rate -Continue aspirin  Acute congestive heart failure with preserved ejection fraction, EF 60 to 65%.  Class III -Echocardiogram 02/2017-EF 60 to 65%, mild LVH, grade 3 DD -Still has dyspnea on exertion -Very gentle diuresis with oral Lasix given soft blood pressure -Monitor electrolytes.  Strict input and output and daily weights -Repeat echocardiogram -Deemed not to be a good candidate for cardiac cath due to her age  Elevated LFTs, cirrhosis with evidence of portal hypertension and ascites --Right upper quadrant ultrasound shows cirrhosis with evidence of portal hypertension and ascites.  Possibly cardiac cirrhosis?  Mild renal insufficiency -Baseline creatinine 0.8.  Admission creatinine 1.0.  Likely from volume overload.  CAD s/p PCI with BMS x2: -Currently patient  is chest pain-free.  Continue aspirin  Hypertension: -Stable.  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  PT/OT eval ordered  Mildprotein calorie malnutrition -Encourage oral diet   DVT prophylaxis: Subcu heparin Code Status: DNR/DNI Family Communication:      Dispo: The patient is from: Home              Anticipated d/c is to: Home              Patient currently is not medically stable to d/c.  Still patient needs gentle diuresis with p.o. Lasix and better heart rate control.  Hopefully discharge in next 24-48 hours after PT evaluation   Difficult to place patient No        Subjective: Sitting up on the stretcher in the ER, no complaints overall feels little better after overnight diuresis and better heart rate control.  She fears that she feels very weak when she gets up.  She lives at home alone and neighbors check up on her periodically.  Review of Systems Otherwise negative except as per HPI, including: General: Denies fever, chills, night sweats or unintended weight loss. Resp: Denies hemoptysis Cardiac: Denies chest pain, palpitations, orthopnea, paroxysmal nocturnal dyspnea. GI: Denies abdominal pain, nausea, vomiting, diarrhea or constipation GU: Denies dysuria, frequency, hesitancy or incontinence MS: Denies muscle aches, joint pain or swelling Neuro: Denies headache, neurologic deficits (focal weakness, numbness, tingling), abnormal gait Psych: Denies anxiety, depression, SI/HI/AVH Skin: Denies new rashes or lesions ID: Denies  sick contacts, exotic exposures, travel  Examination:  General exam: Appears calm and comfortable, elderly frail Respiratory system: Bibasilar crackles Cardiovascular system: S1 & S2 heard, RRR. No JVD, murmurs, rubs, gallops or clicks. No pedal edema. Gastrointestinal system:  Abdomen is nondistended, soft and nontender. No organomegaly or masses felt. Normal bowel sounds heard. Central nervous system: Alert and oriented. No focal neurological deficits. Extremities: Symmetric 5 x 5 power. Skin: No rashes, lesions or ulcers Psychiatry: Judgement and insight appear normal. Mood & affect appropriate.     Objective: Vitals:   04/30/20 2245 05/01/20 0000 05/01/20 0348 05/01/20 0737  BP: 109/65 104/60 (!) 101/59 (!) 98/54  Pulse: (!) 107 (!) 109 79 75  Resp: (!) 26 19 18 17   Temp:      TempSrc:      SpO2: 93% 99% 98% 99%   No intake or output data in the 24 hours ending 05/01/20 0848 There were no vitals filed for this visit.   Data Reviewed:   CBC: Recent Labs  Lab 04/30/20 1822 05/01/20 0458  WBC 6.4 6.4  NEUTROABS 3.5  --   HGB 12.8 11.3*  HCT 40.6 35.3*  MCV 100.5* 97.5  PLT 205 497   Basic Metabolic Panel: Recent Labs  Lab 04/30/20 1822 05/01/20 0458  NA 133* 136  K 4.1 4.2  CL 104 103  CO2 20* 25  GLUCOSE 91 95  BUN 33* 32*  CREATININE 1.06* 1.02*  CALCIUM 8.9 8.7*  MG 2.1 2.0   GFR: Estimated Creatinine Clearance: 21.1 mL/min (A) (by C-G formula based on SCr of 1.02 mg/dL (H)). Liver Function Tests: Recent Labs  Lab 04/30/20 1822  AST 184*  ALT 131*  ALKPHOS 164*  BILITOT 0.8  PROT 7.1  ALBUMIN 3.5   No results for input(s): LIPASE, AMYLASE in the last 168 hours. No results for input(s): AMMONIA in the last 168 hours. Coagulation Profile: No results for input(s): INR, PROTIME in the last 168 hours. Cardiac Enzymes: No results for input(s): CKTOTAL, CKMB, CKMBINDEX, TROPONINI in the last 168 hours. BNP (last 3 results) No results for input(s): PROBNP in the last 8760 hours. HbA1C: No results for input(s): HGBA1C in the last 72 hours. CBG: No results for input(s): GLUCAP in the last 168 hours. Lipid Profile: No results for input(s): CHOL, HDL, LDLCALC, TRIG, CHOLHDL, LDLDIRECT in the last 72 hours. Thyroid  Function Tests: No results for input(s): TSH, T4TOTAL, FREET4, T3FREE, THYROIDAB in the last 72 hours. Anemia Panel: No results for input(s): VITAMINB12, FOLATE, FERRITIN, TIBC, IRON, RETICCTPCT in the last 72 hours. Sepsis Labs: No results for input(s): PROCALCITON, LATICACIDVEN in the last 168 hours.  Recent Results (from the past 240 hour(s))  Resp Panel by RT-PCR (Flu A&B, Covid) Nasopharyngeal Swab     Status: None   Collection Time: 04/30/20  6:54 PM   Specimen: Nasopharyngeal Swab; Nasopharyngeal(NP) swabs in vial transport medium  Result Value Ref Range Status   SARS Coronavirus 2 by RT PCR NEGATIVE NEGATIVE Final    Comment: (NOTE) SARS-CoV-2 target nucleic acids are NOT DETECTED.  The SARS-CoV-2 RNA is generally detectable in upper respiratory specimens during the acute phase of infection. The lowest concentration of SARS-CoV-2 viral copies this assay can detect is 138 copies/mL. A negative result does not preclude SARS-Cov-2 infection and should not be used as the sole basis for treatment or other patient management decisions. A negative result may occur with  improper specimen collection/handling, submission of specimen other than nasopharyngeal swab, presence of  viral mutation(s) within the areas targeted by this assay, and inadequate number of viral copies(<138 copies/mL). A negative result must be combined with clinical observations, patient history, and epidemiological information. The expected result is Negative.  Fact Sheet for Patients:  EntrepreneurPulse.com.au  Fact Sheet for Healthcare Providers:  IncredibleEmployment.be  This test is no t yet approved or cleared by the [place] FDA and  has been authorized for detection and/or diagnosis of SARS-CoV-2 by FDA under an Emergency Use Authorization (EUA). This EUA will remain  in effect (meaning this test can be used) for the duration of the COVID-19 declaration under  Section 564(b)(1) of the Act, 21 U.S.C.section 360bbb-3(b)(1), unless the authorization is terminated  or revoked sooner.       Influenza A by PCR NEGATIVE NEGATIVE Final   Influenza B by PCR NEGATIVE NEGATIVE Final    Comment: (NOTE) The Xpert Xpress SARS-CoV-2/FLU/RSV plus assay is intended as an aid in the diagnosis of influenza from Nasopharyngeal swab specimens and should not be used as a sole basis for treatment. Nasal washings and aspirates are unacceptable for Xpert Xpress SARS-CoV-2/FLU/RSV testing.  Fact Sheet for Patients: EntrepreneurPulse.com.au  Fact Sheet for Healthcare Providers: IncredibleEmployment.be  This test is not yet approved or cleared by the [place] FDA and has been authorized for detection and/or diagnosis of SARS-CoV-2 by FDA under an Emergency Use Authorization (EUA). This EUA will remain in effect (meaning this test can be used) for the duration of the COVID-19 declaration under Section 564(b)(1) of the Act, 21 U.S.C. section 360bbb-3(b)(1), unless the authorization is terminated or revoked.  Performed at [facility], [place] [address], [place], [place] [address]          Radiology Studies: DG Chest Portable 1 View  Result Date: 04/30/2020 CLINICAL DATA:  Fatigue EXAM: PORTABLE CHEST 1 VIEW COMPARISON:  04/13/2020 FINDINGS: There is hyperinflation of the lungs compatible with COPD. Cardiomegaly. Patchy airspace disease throughout the right lung. Small right pleural effusion. No confluent opacity on the left. Areas of scarring bilaterally. No acute bony abnormality. IMPRESSION: Cardiomegaly.  COPD. Airspace disease in the right lower lobe with small right effusion. Findings concerning for pneumonia. Electronically Signed   By: Rolm Baptise M.D.   On: 04/30/2020 19:04   US Abdomen Limited RUQ (LIVER/GB)  Result Date: 04/30/2020 CLINICAL DATA:  87 year old female with elevated LFTs. EXAM: ULTRASOUND  ABDOMEN LIMITED RIGHT UPPER QUADRANT COMPARISON:  CT abdomen pelvis dated 11/06/2011. FINDINGS: Gallbladder: The gallbladder is not identified with certainty and may be contracted. Common bile duct: Diameter: 4 mm Liver: Morphologic changes of cirrhosis. Multiple liver cysts measure up to 3 cm. The main portal vein is patent with somewhat bidirectional flow suggestive of portal hypertension. Other: Partially visualized small ascites as well as right pleural effusion. IMPRESSION: 1. Nonvisualization of the gallbladder. 2. Cirrhosis with evidence of portal hypertension and ascites. 3. Right pleural effusion. Electronically Signed   By: Anner Crete M.D.   On: 04/30/2020 23:47        Scheduled Meds: . aspirin EC  81 mg Oral Daily  . furosemide  40 mg Oral Daily  . heparin  5,000 Units Subcutaneous Q8H  . melatonin  5 mg Oral QHS  . metoprolol succinate  12.5 mg Oral BID  . sodium chloride flush  3 mL Intravenous Q12H   Continuous Infusions:   LOS: 0 days   Time spent= 35 mins    Naomi Fitton Arsenio Loader, MD [facility]  If 7PM-7AM, please  contact night-coverage  05/01/2020, 8:48 AM

## 2020-05-01 NOTE — Progress Notes (Signed)
Pt transferred to 4E, tele initiated, CHG completed, assisted with ordering lunch. VSS. Call bell within reach and bed in lowest locked position.

## 2020-05-01 NOTE — Progress Notes (Signed)
*  PRELIMINARY RESULTS* Echocardiogram 2D Echocardiogram has been performed.  [name] 05/01/2020, 4:53 PM

## 2020-05-02 ENCOUNTER — Observation Stay (HOSPITAL_COMMUNITY): Payer: Medicare PPO

## 2020-05-02 DIAGNOSIS — I480 Paroxysmal atrial fibrillation: Secondary | ICD-10-CM | POA: Diagnosis not present

## 2020-05-02 LAB — COMPREHENSIVE METABOLIC PANEL
ALT: 122 U/L — ABNORMAL HIGH (ref 0–44)
AST: 89 U/L — ABNORMAL HIGH (ref 15–41)
Albumin: 2.9 g/dL — ABNORMAL LOW (ref 3.5–5.0)
Alkaline Phosphatase: 139 U/L — ABNORMAL HIGH (ref 38–126)
Anion gap: 9 (ref 5–15)
BUN: 28 mg/dL — ABNORMAL HIGH (ref 8–23)
CO2: 24 mmol/L (ref 22–32)
Calcium: 8.8 mg/dL — ABNORMAL LOW (ref 8.9–10.3)
Chloride: 102 mmol/L (ref 98–111)
Creatinine, Ser: 0.95 mg/dL (ref 0.44–1.00)
GFR, Estimated: 53 mL/min — ABNORMAL LOW (ref >60)
Glucose, Bld: 88 mg/dL (ref 70–99)
Potassium: 3.8 mmol/L (ref 3.5–5.1)
Sodium: 135 mmol/L (ref 135–145)
Total Bilirubin: 0.8 mg/dL (ref 0.3–1.2)
Total Protein: 5.9 g/dL — ABNORMAL LOW (ref 6.5–8.1)

## 2020-05-02 LAB — T4, FREE: Free T4: 1.13 ng/dL — ABNORMAL HIGH (ref 0.61–1.12)

## 2020-05-02 LAB — CBC
HCT: 32.5 % — ABNORMAL LOW (ref 36.0–46.0)
Hemoglobin: 10.9 g/dL — ABNORMAL LOW (ref 12.0–15.0)
MCH: 32 pg (ref 26.0–34.0)
MCHC: 33.5 g/dL (ref 30.0–36.0)
MCV: 95.3 fL (ref 80.0–100.0)
Platelets: 226 10*3/uL (ref 150–400)
RBC: 3.41 MIL/uL — ABNORMAL LOW (ref 3.87–5.11)
RDW: 12.9 % (ref 11.5–15.5)
WBC: 6.2 10*3/uL (ref 4.0–10.5)
nRBC: 0 % (ref 0.0–0.2)

## 2020-05-02 LAB — PROCALCITONIN: Procalcitonin: 0.12 ng/mL

## 2020-05-02 LAB — MAGNESIUM: Magnesium: 1.8 mg/dL (ref 1.7–2.4)

## 2020-05-02 MED ORDER — METOPROLOL TARTRATE 5 MG/5ML IV SOLN: 5.0000 mg | INTRAVENOUS | Status: DC | PRN

## 2020-05-02 MED ORDER — LACTULOSE 10 GM/15ML PO SOLN: 20.0000 g | Freq: Two times a day (BID) | ORAL | Status: DC | PRN

## 2020-05-02 MED ORDER — POLYETHYLENE GLYCOL 3350 17 G PO PACK
17.0000 g | PACK | Freq: Every day | ORAL | Status: DC
  Administered 2020-05-02 – 2020-05-03 (×2): 17 g via ORAL
  Filled 2020-05-02 (×3): qty 1

## 2020-05-02 MED ORDER — SENNOSIDES-DOCUSATE SODIUM 8.6-50 MG PO TABS
1.0000 | ORAL_TABLET | Freq: Two times a day (BID) | ORAL | Status: DC
  Administered 2020-05-02 – 2020-05-04 (×5): 1 via ORAL
  Filled 2020-05-02 (×5): qty 1

## 2020-05-02 MED ORDER — MAGNESIUM CITRATE PO SOLN
1.0000 | Freq: Once | ORAL | Status: AC
  Administered 2020-05-02: 1 via ORAL
  Filled 2020-05-02: qty 296

## 2020-05-02 MED ORDER — AMIODARONE HCL 200 MG PO TABS
100.0000 mg | ORAL_TABLET | Freq: Every day | ORAL | Status: DC
  Administered 2020-05-02 – 2020-05-04 (×3): 100 mg via ORAL
  Filled 2020-05-02 (×3): qty 1

## 2020-05-02 NOTE — Progress Notes (Signed)
Attempted to perform a TAP enema on pt but was unable to advance tube into stoma, updated doctor and orders received.

## 2020-05-02 NOTE — Evaluation (Signed)
Physical Therapy Evaluation Patient Details Name: [name] MRN: [id number] DOB: Apr 16, 1916 Today's Date: 05/02/2020   History of Present Illness  Patient is a 87 y/o female who presents with SOB and weakness on 05/01/20. CXR- hyperinflated lungs, RLL airspace disease with pleural effusion. Admitted with A-fib with RVR and acute on chronic CHF. PMH includes HTN, MI, stent placement, A-fib/flutter, dizziness, rectal ca with colostomy, left humeral fx and pubic rami fxs.  Clinical Impression  Patient presents with generalized weakness/deconditioning, decreased endurance, dyspnea on exertion and impaired mobility s/p above. Pt lives alone and has support from neighbors and family to assist with driving/grocery shopping. Pt uses RW for ambulation and reports 1 fall into the bookcase recently. Reports her niece can stay with her for a day or two and her good friend can come and help as well. Encouraged OOB and walking to bathroom/hall as able while in the hospital. Sp02 dropped to 86% on RA with activity with 2/4 DOE and HR up to 128 bpm, A-fib with activity. Will follow acutely to maximize independence and mobility prior to return home.    Follow Up Recommendations Home health PT;Supervision - Intermittent (initial supervision for a few days which pt can provide)    Equipment Recommendations  None recommended by PT    Recommendations for Other Services       Precautions / Restrictions Precautions Precautions: Fall;Other (comment) Precaution Comments: watch 02/HR Restrictions Weight Bearing Restrictions: No      Mobility  Bed Mobility Overal bed mobility: Needs Assistance Bed Mobility: Rolling;Sidelying to Sit Rolling: Supervision Sidelying to sit: Supervision;HOB elevated       General bed mobility comments: pt in chair    Transfers Overall transfer level: Needs assistance Equipment used: Rolling walker (2 wheeled) Transfers: Sit to/from Stand Sit to Stand: Min guard;Min  assist         General transfer comment: Min guard-Min A to steady in standing from EOB x2, transferred to chair post ambulation.  Ambulation/Gait Ambulation/Gait assistance: Min guard Gait Distance (Feet): 100 Feet Assistive device: Rolling walker (2 wheeled) Gait Pattern/deviations: Step-through pattern;Decreased stride length;Trunk flexed Gait velocity: decreased Gait velocity interpretation: <1.31 ft/sec, indicative of household ambulator General Gait Details: Slow, mostly steady gait with RW for support; 2/4 DOE. Sp02 dropped to 86% on RA with activity. Cues for pursed lip breathing. Rebounds quickly. Donned 2L post walk. HR in A-fib up to 125 bpm max  Stairs            Wheelchair Mobility    Modified Rankin (Stroke Patients Only)       Balance Overall balance assessment: History of Falls;Needs assistance Sitting-balance support: Feet supported;No upper extremity supported Sitting balance-Leahy Scale: Good Sitting balance - Comments: supervision for safety.   Standing balance support: During functional activity Standing balance-Leahy Scale: Poor Standing balance comment: Requires UE support for dynamic tasks.                             Pertinent Vitals/Pain Pain Assessment: No/denies pain    Home Living Family/patient expects to be discharged to:: Private residence Living Arrangements: Alone Available Help at Discharge: Family;Neighbor;Available PRN/intermittently Type of Home: House Home Access: Ramped entrance;Stairs to enter     Home Layout: One level Home Equipment: Cane - single point;Grab bars - tub/shower;Shower seat - built in;Walker - 2 wheels;Adaptive equipment;Grab bars - toilet Additional Comments: has AE, but does not use, showers 1-2x a week, goes to beauty  shop for hair    Prior Function Level of Independence: Independent with assistive device(s)         Comments: Uses RW for ambulation, Does not drive. 1 partial fall in  last 6 months. Loves to bake/cook.     Hand Dominance   Dominant Hand: Right    Extremity/Trunk Assessment   Upper Extremity Assessment Upper Extremity Assessment: Overall WFL for tasks assessed    Lower Extremity Assessment Lower Extremity Assessment: Defer to PT evaluation    Cervical / Trunk Assessment Cervical / Trunk Assessment: Kyphotic  Communication   Communication: HOH (hearing aid on L)  Cognition Arousal/Alertness: Awake/alert Behavior During Therapy: WFL for tasks assessed/performed Overall Cognitive Status: Within Functional Limits for tasks assessed                                 General Comments: age appropriate, Oriented x4      General Comments General comments (skin integrity, edema, etc.): HR ranging from 80-125 bpm with activity in A-fib. Sp02 93% on RA at rest, dropped to 86% with activity on RA. Donned 2L post session.    Exercises     Assessment/Plan    PT Assessment Patient needs continued PT services  PT Problem List Decreased strength;Decreased mobility;Decreased balance;Cardiopulmonary status limiting activity;Decreased activity tolerance       PT Treatment Interventions Therapeutic exercise;Gait training;Therapeutic activities;Patient/family education;Functional mobility training    PT Goals (Current goals can be found in the Care Plan section)  Acute Rehab PT Goals Patient Stated Goal: to go home PT Goal Formulation: With patient Time For Goal Achievement: 05/16/20 Potential to Achieve Goals: Good    Frequency Min 3X/week   Barriers to discharge Decreased caregiver support lives alone but can have niece stay with her on monday and neighbor/friend on tuesday    Co-evaluation               AM-PAC PT "6 Clicks" Mobility  Outcome Measure Help needed turning from your back to your side while in a flat bed without using bedrails?: A Little Help needed moving from lying on your back to sitting on the side of a  flat bed without using bedrails?: A Little Help needed moving to and from a bed to a chair (including a wheelchair)?: A Little Help needed standing up from a chair using your arms (e.g., wheelchair or bedside chair)?: A Little Help needed to walk in hospital room?: A Little Help needed climbing 3-5 steps with a railing? : A Little 6 Click Score: 18    End of Session Equipment Utilized During Treatment: Oxygen;Gait belt Activity Tolerance: Patient tolerated treatment well;Treatment limited secondary to medical complications (Comment) (drop in Sp02) Patient left: in chair;with call bell/phone within reach;with chair alarm set Nurse Communication: Mobility status;Other (comment) (02 drop) PT Visit Diagnosis: Muscle weakness (generalized) (M62.81);Difficulty in walking, not elsewhere classified (R26.2)    Time: 3903-0092 PT Time Calculation (min) (ACUTE ONLY): 32 min   Charges:   PT Evaluation $PT Eval Moderate Complexity: 1 Mod PT Treatments $Gait Training: 8-22 mins        Marisa Severin, PT, DPT Acute Rehabilitation Services Pager [phone] Office [place] 05/02/2020, 10:35 AM

## 2020-05-02 NOTE — Evaluation (Signed)
Occupational Therapy Evaluation Patient Details Name: [name] MRN: [id number] DOB: [birth year] Today's Date: 05/02/2020    History of Present Illness Patient is a 87 y/o female who presents with SOB and weakness on 05/01/20. CXR- hyperinflated lungs, RLL airspace disease with pleural effusion. Admitted with A-fib with RVR and acute on chronic CHF. PMH includes HTN, MI, stent placement, A-fib/flutter, dizziness, rectal ca with colostomy, left humeral fx and pubic rami fxs.   Clinical Impression   Pt lives alone with support of friends and neighbors. She walks with a walker, primarily sponge bathes, cooks, does her laundry and light housekeeping. Pt reports owning AE for LB ADL, but does not use. She struggles with LB ADL. Will practice use of AE while admitted. Pt likely to progress well. Encouraged walking to the bathroom and avoiding use of purewick.     Follow Up Recommendations  Home health OT;Supervision - Intermittent    Equipment Recommendations  None recommended by OT    Recommendations for Other Services       Precautions / Restrictions Precautions Precautions: Fall;Other (comment) Precaution Comments: watch 02/HR Restrictions Weight Bearing Restrictions: No      Mobility Bed Mobility        General bed mobility comments: pt in chair    Transfers Overall transfer level: Needs assistance Equipment used: Rolling walker (2 wheeled) Transfers: Sit to/from Stand Sit to Stand: Min guard         General transfer comment: slow to rise, no physical assist from recliner or 3 in 1 over toilet    Balance Overall balance assessment: History of Falls;Needs assistance Sitting-balance support: Feet supported;No upper extremity supported Sitting balance-Leahy Scale: Good Sitting balance - Comments: supervision for safety.   Standing balance support: During functional activity Standing balance-Leahy Scale: Poor Standing balance comment: can release walker in  static standing                           ADL either performed or assessed with clinical judgement   ADL Overall ADL's : Needs assistance/impaired Eating/Feeding: Independent   Grooming: Oral care;Standing;Supervision/safety   Upper Body Bathing: Set up;Sitting   Lower Body Bathing: Supervison/ safety;Sit to/from stand   Upper Body Dressing : Set up;Sitting   Lower Body Dressing: Supervision/safety;Sit to/from stand   Toilet Transfer: Ambulation;RW;Min guard   Toileting- Clothing Manipulation and Hygiene: Minimal assistance;Sit to/from stand       Functional mobility during ADLs: Min guard;Rolling walker General ADL Comments: pt reports struggling with LB dressing, has AE, will practice     Vision Baseline Vision/History: Wears glasses Wears Glasses: At all times Patient Visual Report: No change from baseline       Perception     Praxis      Pertinent Vitals/Pain Pain Assessment: No/denies pain     Hand Dominance Right   Extremity/Trunk Assessment Upper Extremity Assessment Upper Extremity Assessment: Overall WFL for tasks assessed   Lower Extremity Assessment Lower Extremity Assessment: Defer to PT evaluation   Cervical / Trunk Assessment Cervical / Trunk Assessment: Kyphotic   Communication Communication Communication: HOH (hearing aid on L)   Cognition Arousal/Alertness: Awake/alert Behavior During Therapy: WFL for tasks assessed/performed Overall Cognitive Status: Within Functional Limits for tasks assessed                                 General Comments: age appropriate, Oriented x4  General Comments  HR ranging from 80-125 bpm with activity in A-fib. Sp02 93% on RA at rest, dropped to 86% with activity on RA. Donned 2L post session.    Exercises     Shoulder Instructions      Home Living Family/patient expects to be discharged to:: Private residence Living Arrangements: Alone Available Help at Discharge:  Family;Neighbor;Available PRN/intermittently Type of Home: House Home Access: Ramped entrance;Stairs to enter     Home Layout: One level     Bathroom Shower/Tub: Occupational psychologist: Handicapped height Bathroom Accessibility: Yes   Home Equipment: Cane - single point;Grab bars - tub/shower;Shower seat - built in;Walker - 2 wheels;Adaptive equipment;Grab bars - toilet Adaptive Equipment: Reacher;Sock aid;Long-handled shoe horn;Long-handled sponge Additional Comments: has AE, but does not use, showers 1-2x a week, goes to beauty shop for hair      Prior Functioning/Environment Level of Independence: Independent with assistive device(s)        Comments: Uses RW for ambulation, Does not drive. 1 partial fall in last 6 months. Loves to bake/cook.        OT Problem List: Decreased activity tolerance;Impaired balance (sitting and/or standing)      OT Treatment/Interventions: Self-care/ADL training;DME and/or AE instruction;Patient/family education;Balance training;Therapeutic activities    OT Goals(Current goals can be found in the care plan section) Acute Rehab OT Goals Patient Stated Goal: return home OT Goal Formulation: With patient Time For Goal Achievement: 05/16/20 Potential to Achieve Goals: Good ADL Goals Pt Will Perform Grooming: with modified independence;standing Pt Will Perform Lower Body Bathing: with modified independence;with adaptive equipment;sit to/from stand Pt Will Perform Lower Body Dressing: with modified independence;sit to/from stand Pt Will Transfer to Toilet: with modified independence;ambulating;bedside commode (over toilet) Pt Will Perform Toileting - Clothing Manipulation and hygiene: with modified independence;sit to/from stand  OT Frequency: Min 2X/week   Barriers to D/C:            Co-evaluation              AM-PAC OT "6 Clicks" Daily Activity     Outcome Measure Help from another person eating meals?: None Help from  another person taking care of personal grooming?: A Little Help from another person toileting, which includes using toliet, bedpan, or urinal?: A Little Help from another person bathing (including washing, rinsing, drying)?: A Little Help from another person to put on and taking off regular upper body clothing?: A Little Help from another person to put on and taking off regular lower body clothing?: A Little 6 Click Score: 19   End of Session Equipment Utilized During Treatment: Rolling walker;Oxygen (2.5L)  Activity Tolerance: Patient tolerated treatment well Patient left: in chair;with call bell/phone within reach;with chair alarm set  OT Visit Diagnosis: Unsteadiness on feet (R26.81);Other abnormalities of gait and mobility (R26.89)                Time: 8280-0349 OT Time Calculation (min): 24 min Charges:  OT General Charges $OT Visit: 1 Visit OT Evaluation $OT Eval Moderate Complexity: 1 Mod OT Treatments $Self Care/Home Management : 8-22 mins  Nestor Lewandowsky, OTR/L Acute Rehabilitation Services Pager: [phone] Office: [phone]  Malka So 05/02/2020, 10:12 AM

## 2020-05-02 NOTE — Progress Notes (Signed)
PROGRESS NOTE    [name]  ZOX:[id number] DOB: 03-22-1916 DOA: 04/30/2020 PCP: Leonard Downing, MD   Brief Narrative:  23 with past medical history of paroxysmal A. fib not on AC, diastolic CHF EF 60 to 81%, grade 3 DD, CAD status post PCI with BMS 6 2, first-degree AV block, rectal cancer status post proctectomy with colostomy, HTN admitted for shortness of breath and generalized weakness.  Ongoing for past several weeks with chronic dyspnea on exertion.  Upon admission noted to be in A. fib with RVR and volume overload.  Echocardiogram shows EF 60 to 65% with dilated cardiomyopathy, severe TR, moderate MR.   Assessment & Plan:   Principal Problem:   Paroxysmal atrial fibrillation with rapid ventricular response (HCC) Active Problems:   CAD (coronary artery disease)   HTN (hypertension)   Acute on chronic diastolic CHF (congestive heart failure) (HCC)   Elevated LFTs   AKI (acute kidney injury) ([facility])   Other cirrhosis of liver (HCC)    Paroxysmal atrial fibrillation/flutter with RVR: -Heart rate still fluctuating quite a bit even with minimal mobility as high as 130.  Not on anticoagulation due to risk of falling.  Resume amiodarone 100 mg orally daily.  LFTs improving. -Home Toprol increased to 12.5 mg twice daily.  Closely monitor rate -Continue aspirin -PT/OT-home health  Acute congestive heart failure with preserved ejection fraction, EF 60 to 65%.  Class III Acute hypoxia secondary to CHF -Echocardiogram 05/01/2020-EF 60 to 19%, grade 3 diastolic dysfunction, severe TR, moderate MR -This morning on 2 L nasal cannula. -Monitor electrolytes.  Strict input and output and daily weights -Deemed not to be a good candidate for cardiac cath due to her age -Continue Lasix 40 mg orally daily.  Unable to aggressively diurese given her age and comorbidities  Elevated LFTs, improving.  Cirrhosis with evidence of portal hypertension and ascites --Right upper quadrant  ultrasound shows cirrhosis with evidence of portal hypertension and ascites.  Possibly cardiac cirrhosis? -Discussed this with patient's niece and the patient-does not want any further investigation at this time therefore hold off on any further work-up or liver biopsy.  Mild renal insufficiency, improving -Baseline creatinine 0.8.  Admission creatinine 1.0.  Likely from volume overload.  CAD s/p PCI with BMS x2: -Currently patient is chest pain-free.  Continue aspirin  Hypertension: -Stable.  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  PT/OT eval-home health  Mildprotein calorie malnutrition -Encourage oral diet   DVT prophylaxis: Subcu heparin Code Status: DNR/DNI Family Communication: Spoke with her niece yesterday    Dispo: The patient is from: Home              Anticipated d/c is to: Home              Patient currently is not medically stable to d/c.  Patient is still on 2 L nasal cannula, with minimal ambulation heart rate as high as 130.  Tells me she still overall feels very weak and some dyspnea with exertion.   Difficult to place patient No        Subjective: Sitting up in the bed does not have any complaints.  When working with physical therapy she became very tachycardic with heart rate as high as 130-140 in A. fib.  Overall felt weak and not yet back to baseline.  Fearful of going home because she  lives at home alone. Also discussed cirrhosis with her, does not want any further work-up for this at this time  Review of Systems Otherwise negative except as per HPI, including: General: Denies fever, chills, night sweats or unintended weight loss. Resp: Denies cough, wheezing, shortness of breath. Cardiac: Denies chest pain, palpitations, orthopnea, paroxysmal nocturnal dyspnea. GI: Denies abdominal pain,  nausea, vomiting, diarrhea or constipation GU: Denies dysuria, frequency, hesitancy or incontinence MS: Denies muscle aches, joint pain or swelling Neuro: Denies headache, neurologic deficits (focal weakness, numbness, tingling), abnormal gait Psych: Denies anxiety, depression, SI/HI/AVH Skin: Denies new rashes or lesions ID: Denies sick contacts, exotic exposures, travel  Examination:  Constitutional: Not in acute distress, elderly frail.  2 L nasal cannula Respiratory: Bibasilar crackles Cardiovascular: Irregularly irregular in A. fib Abdomen: Nontender nondistended good bowel sounds Musculoskeletal: No edema noted Skin: No rashes seen Neurologic: CN 2-12 grossly intact.  And nonfocal Psychiatric: Normal judgment and insight. Alert and oriented x 3. Normal mood.  Objective: Vitals:   05/01/20 1310 05/01/20 1349 05/01/20 1707 05/02/20 0800  BP: (!) 104/58 111/60 122/66 120/70  Pulse: 79 93 80 82  Resp: 20 18 18 16   Temp: 98.2 F (36.8 C) 97.6 F (36.4 C) 98.6 F (37 C)   TempSrc: Oral Oral Oral   SpO2: 97% 97% 100% 97%    Intake/Output Summary (Last 24 hours) at 05/02/2020 1058 Last data filed at 05/02/2020 0839 Gross per 24 hour  Intake 240 ml  Output 850 ml  Net -610 ml   There were no vitals filed for this visit.   Data Reviewed:   CBC: Recent Labs  Lab 04/30/20 1822 05/01/20 0458 05/02/20 0152  WBC 6.4 6.4 6.2  NEUTROABS 3.5  --   --   HGB 12.8 11.3* 10.9*  HCT 40.6 35.3* 32.5*  MCV 100.5* 97.5 95.3  PLT 205 228 546   Basic Metabolic Panel: Recent Labs  Lab 04/30/20 1822 05/01/20 0458 05/02/20 0152  NA 133* 136 135  K 4.1 4.2 3.8  CL 104 103 102  CO2 20* 25 24  GLUCOSE 91 95 88  BUN 33* 32* 28*  CREATININE 1.06* 1.02* 0.95  CALCIUM 8.9 8.7* 8.8*  MG 2.1 2.0 1.8   GFR: Estimated Creatinine Clearance: 22.7 mL/min (by C-G formula based on SCr of 0.95 mg/dL). Liver Function Tests: Recent Labs  Lab 04/30/20 1822 05/01/20 1430  05/02/20 0152  AST 184* 149* 89*  ALT 131* 167* 122*  ALKPHOS 164* 175* 139*  BILITOT 0.8 0.7 0.8  PROT 7.1 7.1 5.9*  ALBUMIN 3.5 3.3* 2.9*   No results for input(s): LIPASE, AMYLASE in the last 168 hours. No results for input(s): AMMONIA in the last 168 hours. Coagulation Profile: No results for input(s): INR, PROTIME in the last 168 hours. Cardiac Enzymes: No results for input(s): CKTOTAL, CKMB, CKMBINDEX, TROPONINI in the last 168 hours. BNP (last 3 results) No results for input(s): PROBNP in the last 8760 hours. HbA1C: No results for input(s): HGBA1C in the last 72 hours. CBG: No results for input(s): GLUCAP in the last 168 hours. Lipid Profile: No results for input(s): CHOL, HDL, LDLCALC, TRIG, CHOLHDL, LDLDIRECT in the last 72 hours. Thyroid Function Tests: Recent Labs    05/01/20 0913  TSH 6.476*   Anemia Panel: No results for input(s): VITAMINB12, FOLATE, FERRITIN, TIBC, IRON, RETICCTPCT in the last 72 hours. Sepsis Labs: Recent Labs  Lab 05/02/20 0152  PROCALCITON 0.12    Recent Results (from the past 240 hour(s))  Resp Panel by RT-PCR (Flu A&B, Covid) Nasopharyngeal Swab     Status: None   Collection Time: 04/30/20  6:54 PM   Specimen: Nasopharyngeal Swab; Nasopharyngeal(NP) swabs in vial transport medium  Result Value Ref Range Status   SARS Coronavirus 2 by RT PCR NEGATIVE NEGATIVE Final    Comment: (NOTE) SARS-CoV-2 target nucleic acids are NOT DETECTED.  The SARS-CoV-2 RNA is generally detectable in upper respiratory specimens during the acute phase of infection. The lowest concentration of SARS-CoV-2 viral copies this assay can detect is 138 copies/mL. A negative result does not preclude SARS-Cov-2 infection and should not be used as the sole basis for treatment or other patient management decisions. A negative result may occur with  improper specimen collection/handling, submission of specimen other than nasopharyngeal swab, presence of viral  mutation(s) within the areas targeted by this assay, and inadequate number of viral copies(<138 copies/mL). A negative result must be combined with clinical observations, patient history, and epidemiological information. The expected result is Negative.  Fact Sheet for Patients:  EntrepreneurPulse.com.au  Fact Sheet for Healthcare Providers:  IncredibleEmployment.be  This test is no t yet approved or cleared by the [place] FDA and  has been authorized for detection and/or diagnosis of SARS-CoV-2 by FDA under an Emergency Use Authorization (EUA). This EUA will remain  in effect (meaning this test can be used) for the duration of the COVID-19 declaration under Section 564(b)(1) of the Act, 21 U.S.C.section 360bbb-3(b)(1), unless the authorization is terminated  or revoked sooner.       Influenza A by PCR NEGATIVE NEGATIVE Final   Influenza B by PCR NEGATIVE NEGATIVE Final    Comment: (NOTE) The Xpert Xpress SARS-CoV-2/FLU/RSV plus assay is intended as an aid in the diagnosis of influenza from Nasopharyngeal swab specimens and should not be used as a sole basis for treatment. Nasal washings and aspirates are unacceptable for Xpert Xpress SARS-CoV-2/FLU/RSV testing.  Fact Sheet for Patients: EntrepreneurPulse.com.au  Fact Sheet for Healthcare Providers: IncredibleEmployment.be  This test is not yet approved or cleared by the [place] FDA and has been authorized for detection and/or diagnosis of SARS-CoV-2 by FDA under an Emergency Use Authorization (EUA). This EUA will remain in effect (meaning this test can be used) for the duration of the COVID-19 declaration under Section 564(b)(1) of the Act, 21 U.S.C. section 360bbb-3(b)(1), unless the authorization is terminated or revoked.  Performed at [facility], [place] [address], [place], [place] [address]          Radiology Studies: DG  Chest Portable 1 View  Result Date: 04/30/2020 CLINICAL DATA:  Fatigue EXAM: PORTABLE CHEST 1 VIEW COMPARISON:  04/13/2020 FINDINGS: There is hyperinflation of the lungs compatible with COPD. Cardiomegaly. Patchy airspace disease throughout the right lung. Small right pleural effusion. No confluent opacity on the left. Areas of scarring bilaterally. No acute bony abnormality. IMPRESSION: Cardiomegaly.  COPD. Airspace disease in the right lower lobe with small right effusion. Findings concerning for pneumonia. Electronically Signed   By: Rolm Baptise M.D.   On: 04/30/2020 19:04   ECHOCARDIOGRAM COMPLETE  Result Date: 05/01/2020    ECHOCARDIOGRAM REPORT   Patient Name:   EYLA TALLON [facility] Date of Exam: 05/01/2020 Medical Rec #:  [id number]       Height:       63.0 in Accession #:    [id number]      Weight:       103.2 lb Date of Birth:  [birth year]  BSA:          1.460 m Patient Age:    87 years       BP:           103/53 mmHg Patient Gender: F               HR:           84 bpm. Exam Location:  Inpatient Procedure: 2D Echo, Cardiac Doppler and Color Doppler Indications:    Cardiomyopathy-Unspecified I42.9  History:        Patient has prior history of Echocardiogram examinations, most                 recent 02/23/2017. CAD and Previous Myocardial Infarction, TIA,                 Arrythmias:Atrial Flutter and Atrial Fibrillation; Risk                 Factors:Hypertension and Dyslipidemia. First degree heart block                 (From Hx), Heart palpitations (From Hx), S/P coronary artery                 stent placement (From Hx).  Sonographer:    Alvino Chapel RCS Referring Phys: [id number] Hamish Banks CHIRAG Carliss Porcaro IMPRESSIONS  1. Left ventricular ejection fraction, by estimation, is 60 to 65%. The left ventricle has normal function. The left ventricle has no regional wall motion abnormalities. There is mild concentric left ventricular hypertrophy. Left ventricular diastolic function could not be evaluated. There is the  interventricular septum is flattened in systole and diastole, consistent with right ventricular pressure and volume overload.  2. Right ventricular systolic function is normal. The right ventricular size is mildly enlarged. There is mildly elevated pulmonary artery systolic pressure. The estimated right ventricular systolic pressure is 82.9 mmHg.  3. Left atrial size was moderately dilated.  4. Right atrial size was severely dilated.  5. The mitral valve is normal in structure. Moderate mitral valve regurgitation. No evidence of mitral stenosis. Severe mitral annular calcification.  6. Tricuspid valve regurgitation is severe.  7. The aortic valve is normal in structure. There is mild calcification of the aortic valve. There is mild thickening of the aortic valve. Aortic valve regurgitation is mild to moderate. Mild to moderate aortic valve sclerosis/calcification is present, without any evidence of aortic stenosis.  8. The inferior vena cava is dilated in size with <50% respiratory variability, suggesting right atrial pressure of 15 mmHg.  9. Evidence of atrial level shunting detected by color flow Doppler. There is a small secundum atrial septal defect with predominantly left to right shunting across the atrial septum. FINDINGS  Left Ventricle: Left ventricular ejection fraction, by estimation, is 60 to 65%. The left ventricle has normal function. The left ventricle has no regional wall motion abnormalities. The left ventricular internal cavity size was normal in size. There is  mild concentric left ventricular hypertrophy. The interventricular septum is flattened in systole and diastole, consistent with right ventricular pressure and volume overload. Left ventricular diastolic function could not be evaluated due to atrial fibrillation. Left ventricular diastolic function could not be evaluated. Right Ventricle: The right ventricular size is mildly enlarged. No increase in right ventricular wall thickness. Right  ventricular systolic function is normal. There is mildly elevated pulmonary artery systolic pressure. The tricuspid regurgitant velocity is 2.60 m/s, and with an assumed right atrial pressure of 15 mmHg,  the estimated right ventricular systolic pressure is 42.3 mmHg. Left Atrium: Left atrial size was moderately dilated. Right Atrium: Right atrial size was severely dilated. Pericardium: There is no evidence of pericardial effusion. Mitral Valve: The mitral valve is normal in structure. There is mild thickening of the mitral valve leaflet(s). There is mild calcification of the mitral valve leaflet(s). Severe mitral annular calcification. Moderate mitral valve regurgitation, with posteriorly-directed jet. No evidence of mitral valve stenosis. Tricuspid Valve: The tricuspid valve is normal in structure. Tricuspid valve regurgitation is severe. No evidence of tricuspid stenosis. Aortic Valve: The aortic valve is normal in structure. There is mild calcification of the aortic valve. There is mild thickening of the aortic valve. Aortic valve regurgitation is mild to moderate. Aortic regurgitation PHT measures 434 msec. Mild to moderate aortic valve sclerosis/calcification is present, without any evidence of aortic stenosis. Pulmonic Valve: The pulmonic valve was normal in structure. Pulmonic valve regurgitation is not visualized. No evidence of pulmonic stenosis. Aorta: The aortic root is normal in size and structure. Venous: The inferior vena cava is dilated in size with less than 50% respiratory variability, suggesting right atrial pressure of 15 mmHg. IAS/Shunts: Evidence of atrial level shunting detected by color flow Doppler. There is a small secundum atrial septal defect with predominantly left to right shunting across the atrial septum.  LEFT VENTRICLE PLAX 2D LVIDd:         3.40 cm LVIDs:         2.20 cm LV PW:         0.90 cm LV IVS:        1.00 cm LVOT diam:     1.60 cm LV SV:         28 LV SV Index:   19 LVOT  Area:     2.01 cm  RIGHT VENTRICLE TAPSE (M-mode): 2.3 cm LEFT ATRIUM             Index       RIGHT ATRIUM           Index LA diam:        3.90 cm 2.67 cm/m  RA Area:     25.50 cm LA Vol (A2C):   98.2 ml 67.28 ml/m RA Volume:   88.60 ml  60.70 ml/m LA Vol (A4C):   83.9 ml 57.48 ml/m LA Biplane Vol: 96.0 ml 65.77 ml/m  AORTIC VALVE LVOT Vmax:   63.50 cm/s LVOT Vmean:  45.000 cm/s LVOT VTI:    0.137 m AI PHT:      434 msec  AORTA Ao Root diam: 3.20 cm MITRAL VALVE                TRICUSPID VALVE MV Area (PHT): 5.54 cm     TR Peak grad:   27.0 mmHg MV Decel Time: 137 msec     TR Vmax:        260.00 cm/s MV E velocity: 155.00 cm/s                             SHUNTS                             Systemic VTI:  0.14 m                             Systemic Diam: 1.60  cm Ena Dawley MD Electronically signed by Ena Dawley MD Signature Date/Time: 05/01/2020/11:59:17 PM    Final    US Abdomen Limited RUQ (LIVER/GB)  Result Date: 04/30/2020 CLINICAL DATA:  87 year old female with elevated LFTs. EXAM: ULTRASOUND ABDOMEN LIMITED RIGHT UPPER QUADRANT COMPARISON:  CT abdomen pelvis dated 11/06/2011. FINDINGS: Gallbladder: The gallbladder is not identified with certainty and may be contracted. Common bile duct: Diameter: 4 mm Liver: Morphologic changes of cirrhosis. Multiple liver cysts measure up to 3 cm. The main portal vein is patent with somewhat bidirectional flow suggestive of portal hypertension. Other: Partially visualized small ascites as well as right pleural effusion. IMPRESSION: 1. Nonvisualization of the gallbladder. 2. Cirrhosis with evidence of portal hypertension and ascites. 3. Right pleural effusion. Electronically Signed   By: Anner Crete M.D.   On: 04/30/2020 23:47        Scheduled Meds: . aspirin EC  81 mg Oral Daily  . furosemide  40 mg Oral Daily  . heparin  5,000 Units Subcutaneous Q8H  . melatonin  5 mg Oral QHS  . metoprolol succinate  12.5 mg Oral BID  . polyethylene  glycol  17 g Oral Daily  . senna-docusate  1 tablet Oral BID  . sertraline  25 mg Oral QHS  . sodium chloride flush  3 mL Intravenous Q12H   Continuous Infusions:   LOS: 0 days   Time spent= 35 mins    Pryce Folts Arsenio Loader, MD [facility]  If 7PM-7AM, please contact night-coverage  05/02/2020, 10:58 AM

## 2020-05-02 NOTE — Progress Notes (Signed)
Mobility Specialist - Progress Note   05/02/20 1306  Mobility  Activity Contraindicated/medical hold   Pt's HR is currently 125 at rest, will f/u as able.   Pricilla Handler Mobility Specialist Mobility Specialist Phone: [phone]

## 2020-05-03 ENCOUNTER — Observation Stay (HOSPITAL_COMMUNITY): Payer: Medicare PPO

## 2020-05-03 DIAGNOSIS — Z9181 History of falling: Secondary | ICD-10-CM | POA: Diagnosis not present

## 2020-05-03 DIAGNOSIS — M8580 Other specified disorders of bone density and structure, unspecified site: Secondary | ICD-10-CM | POA: Diagnosis present

## 2020-05-03 DIAGNOSIS — Z85048 Personal history of other malignant neoplasm of rectum, rectosigmoid junction, and anus: Secondary | ICD-10-CM | POA: Diagnosis not present

## 2020-05-03 DIAGNOSIS — E785 Hyperlipidemia, unspecified: Secondary | ICD-10-CM | POA: Diagnosis present

## 2020-05-03 DIAGNOSIS — R188 Other ascites: Secondary | ICD-10-CM | POA: Diagnosis present

## 2020-05-03 DIAGNOSIS — I4891 Unspecified atrial fibrillation: Secondary | ICD-10-CM | POA: Diagnosis present

## 2020-05-03 DIAGNOSIS — Z66 Do not resuscitate: Secondary | ICD-10-CM | POA: Diagnosis present

## 2020-05-03 DIAGNOSIS — Z974 Presence of external hearing-aid: Secondary | ICD-10-CM | POA: Diagnosis not present

## 2020-05-03 DIAGNOSIS — I480 Paroxysmal atrial fibrillation: Secondary | ICD-10-CM | POA: Diagnosis present

## 2020-05-03 DIAGNOSIS — R0602 Shortness of breath: Secondary | ICD-10-CM | POA: Diagnosis present

## 2020-05-03 DIAGNOSIS — J984 Other disorders of lung: Secondary | ICD-10-CM | POA: Diagnosis present

## 2020-05-03 DIAGNOSIS — E46 Unspecified protein-calorie malnutrition: Secondary | ICD-10-CM | POA: Diagnosis present

## 2020-05-03 DIAGNOSIS — I252 Old myocardial infarction: Secondary | ICD-10-CM | POA: Diagnosis not present

## 2020-05-03 DIAGNOSIS — N179 Acute kidney failure, unspecified: Secondary | ICD-10-CM | POA: Diagnosis present

## 2020-05-03 DIAGNOSIS — K7469 Other cirrhosis of liver: Secondary | ICD-10-CM | POA: Diagnosis present

## 2020-05-03 DIAGNOSIS — I2510 Atherosclerotic heart disease of native coronary artery without angina pectoris: Secondary | ICD-10-CM | POA: Diagnosis present

## 2020-05-03 DIAGNOSIS — Z933 Colostomy status: Secondary | ICD-10-CM | POA: Diagnosis not present

## 2020-05-03 DIAGNOSIS — R7989 Other specified abnormal findings of blood chemistry: Secondary | ICD-10-CM | POA: Diagnosis present

## 2020-05-03 DIAGNOSIS — R0902 Hypoxemia: Secondary | ICD-10-CM | POA: Diagnosis present

## 2020-05-03 DIAGNOSIS — Z20822 Contact with and (suspected) exposure to covid-19: Secondary | ICD-10-CM | POA: Diagnosis present

## 2020-05-03 DIAGNOSIS — I4892 Unspecified atrial flutter: Secondary | ICD-10-CM | POA: Diagnosis present

## 2020-05-03 DIAGNOSIS — I5033 Acute on chronic diastolic (congestive) heart failure: Secondary | ICD-10-CM | POA: Diagnosis present

## 2020-05-03 DIAGNOSIS — I110 Hypertensive heart disease with heart failure: Secondary | ICD-10-CM | POA: Diagnosis present

## 2020-05-03 DIAGNOSIS — K766 Portal hypertension: Secondary | ICD-10-CM | POA: Diagnosis present

## 2020-05-03 DIAGNOSIS — Z955 Presence of coronary angioplasty implant and graft: Secondary | ICD-10-CM | POA: Diagnosis not present

## 2020-05-03 DIAGNOSIS — I083 Combined rheumatic disorders of mitral, aortic and tricuspid valves: Secondary | ICD-10-CM | POA: Diagnosis present

## 2020-05-03 LAB — COMPREHENSIVE METABOLIC PANEL
ALT: 91 U/L — ABNORMAL HIGH (ref 0–44)
AST: 50 U/L — ABNORMAL HIGH (ref 15–41)
Albumin: 3 g/dL — ABNORMAL LOW (ref 3.5–5.0)
Alkaline Phosphatase: 132 U/L — ABNORMAL HIGH (ref 38–126)
Anion gap: 7 (ref 5–15)
BUN: 26 mg/dL — ABNORMAL HIGH (ref 8–23)
CO2: 27 mmol/L (ref 22–32)
Calcium: 9.3 mg/dL (ref 8.9–10.3)
Chloride: 104 mmol/L (ref 98–111)
Creatinine, Ser: 0.86 mg/dL (ref 0.44–1.00)
GFR, Estimated: 60 mL/min — ABNORMAL LOW (ref >60)
Glucose, Bld: 99 mg/dL (ref 70–99)
Potassium: 3.9 mmol/L (ref 3.5–5.1)
Sodium: 138 mmol/L (ref 135–145)
Total Bilirubin: 0.6 mg/dL (ref 0.3–1.2)
Total Protein: 6.3 g/dL — ABNORMAL LOW (ref 6.5–8.1)

## 2020-05-03 LAB — CBC
HCT: 35.5 % — ABNORMAL LOW (ref 36.0–46.0)
Hemoglobin: 11.9 g/dL — ABNORMAL LOW (ref 12.0–15.0)
MCH: 32.2 pg (ref 26.0–34.0)
MCHC: 33.5 g/dL (ref 30.0–36.0)
MCV: 96.2 fL (ref 80.0–100.0)
Platelets: 240 10*3/uL (ref 150–400)
RBC: 3.69 MIL/uL — ABNORMAL LOW (ref 3.87–5.11)
RDW: 12.8 % (ref 11.5–15.5)
WBC: 6.7 10*3/uL (ref 4.0–10.5)
nRBC: 0 % (ref 0.0–0.2)

## 2020-05-03 LAB — MAGNESIUM: Magnesium: 2.2 mg/dL (ref 1.7–2.4)

## 2020-05-03 LAB — HCV RNA QUANT: HCV Quantitative: NOT DETECTED IU/mL (ref >50)

## 2020-05-03 MED ORDER — FUROSEMIDE 10 MG/ML IJ SOLN
20.0000 mg | Freq: Once | INTRAMUSCULAR | Status: AC
  Administered 2020-05-03: 20 mg via INTRAVENOUS
  Filled 2020-05-03: qty 2

## 2020-05-03 NOTE — Progress Notes (Signed)
PROGRESS NOTE    [name]  HYQ:[id number] DOB: September 03, 1916 DOA: 04/30/2020 PCP: Leonard Downing, MD   Brief Narrative:  14 with past medical history of paroxysmal A. fib not on AC, diastolic CHF EF 60 to 95%, grade 3 DD, CAD status post PCI with BMS 6 2, first-degree AV block, rectal cancer status post proctectomy with colostomy, HTN admitted for shortness of breath and generalized weakness.  Ongoing for past several weeks with chronic dyspnea on exertion.  Upon admission noted to be in A. fib with RVR and volume overload.  Echocardiogram shows EF 60 to 65% with dilated cardiomyopathy, severe TR, moderate MR.  Assessment & Plan:   Principal Problem:   Paroxysmal atrial fibrillation with rapid ventricular response (HCC) Active Problems:   CAD (coronary artery disease)   HTN (hypertension)   Acute on chronic diastolic CHF (congestive heart failure) (HCC)   Elevated LFTs   AKI (acute kidney injury) ([place])   Other cirrhosis of liver (HCC)    Paroxysmal atrial fibrillation/flutter with RVR: -HR improved.  Not on anticoagulation due to risk of falling.  Resume amiodarone 100 mg orally daily.  LFTs improving. -Home Toprol increased to 12.5 mg twice daily.  Closely monitor rate -Continue aspirin -PT/OT-home health  Acute congestive heart failure with preserved ejection fraction, EF 60 to 65%.  Class III Acute hypoxia secondary to CHF -Echocardiogram 05/01/2020-EF 60 to 28%, grade 3 diastolic dysfunction, severe TR, moderate MR -On 2-4 L [place] -Monitor electrolytes.  Strict input and output and daily weights -Deemed not to be a good candidate for cardiac cath due to her age -Continue Lasix 40 mg orally daily.  Additional lasix 20mg  IV once later today.   Elevated LFTs, improving.  Cirrhosis with evidence of portal hypertension and ascites --Right upper quadrant ultrasound shows cirrhosis with evidence of portal hypertension and ascites.  Possibly cardiac cirrhosis? -Discussed  this with patient's niece and the patient-does not want any further investigation at this time therefore hold off on any further work-up or liver biopsy.  Mild renal insufficiency, improving -Baseline creatinine 0.8.  Admission creatinine 1.0.  Likely from volume overload.  CAD s/p PCI with BMS x2: -Currently patient is chest pain-free.  Continue aspirin  Hypertension: -Stable.  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  PT/OT eval-home health  Mildprotein calorie malnutrition -Encourage oral diet   DVT prophylaxis: Subcu heparin Code Status: DNR/DNI Family Communication: Spoke with her niece today    Dispo: The patient is from: Home              Anticipated d/c is to: Home              Patient currently is not medically stable to d/c.  HR is better but worsening right sided pleural effusion, give IV lasix today.    Difficult to place patient No        Subjective: Patient had a good bowel movement yesterday, has some exertional dyspnea this morning but no new complaints. Review of Systems Otherwise negative except as per HPI, including: General: Denies fever, chills, night sweats or unintended weight loss. Resp: Denies cough, wheezing Cardiac: Denies chest pain, palpitations, orthopnea, paroxysmal nocturnal dyspnea. GI: Denies abdominal pain, nausea, vomiting, diarrhea or constipation GU: Denies dysuria, frequency, hesitancy or incontinence MS: Denies muscle aches, joint pain or swelling Neuro: Denies headache, neurologic deficits (focal weakness, numbness,  tingling), abnormal gait Psych: Denies anxiety, depression, SI/HI/AVH Skin: Denies new rashes or lesions ID: Denies sick contacts, exotic exposures, travel Examination:  Constitutional: Not in acute distress   Elderly frail.  2-3 L nasal cannula Respiratory:  Diminished breath sounds at the right lower lung base Cardiovascular: Irregularly irregular.  Systolic murmur. Abdomen: Nontender nondistended good bowel sounds Musculoskeletal: No edema noted Skin: No rashes seen Neurologic: CN 2-12 grossly intact.  And nonfocal Psychiatric: Normal judgment and insight. Alert and oriented x 3. Normal mood., Objective: Vitals:   05/03/20 0400 05/03/20 0508 05/03/20 0740 05/03/20 1133  BP: 133/73  125/72 133/63  Pulse: 79  81 (!) 55  Resp: 16 18 18 18   Temp:   (!) 97.4 F (36.3 C) (!) 97.5 F (36.4 C)  TempSrc: Oral Oral Oral Oral  SpO2: 100%  100% 100%    Intake/Output Summary (Last 24 hours) at 05/03/2020 1206 Last data filed at 05/03/2020 1147 Gross per 24 hour  Intake -  Output 550 ml  Net -550 ml   There were no vitals filed for this visit.   Data Reviewed:   CBC: Recent Labs  Lab 04/30/20 1822 05/01/20 0458 05/02/20 0152 05/03/20 0204  WBC 6.4 6.4 6.2 6.7  NEUTROABS 3.5  --   --   --   HGB 12.8 11.3* 10.9* 11.9*  HCT 40.6 35.3* 32.5* 35.5*  MCV 100.5* 97.5 95.3 96.2  PLT 205 228 226 644   Basic Metabolic Panel: Recent Labs  Lab 04/30/20 1822 05/01/20 0458 05/02/20 0152 05/03/20 0204  NA 133* 136 135 138  K 4.1 4.2 3.8 3.9  CL 104 103 102 104  CO2 20* 25 24 27   GLUCOSE 91 95 88 99  BUN 33* 32* 28* 26*  CREATININE 1.06* 1.02* 0.95 0.86  CALCIUM 8.9 8.7* 8.8* 9.3  MG 2.1 2.0 1.8 2.2   GFR: Estimated Creatinine Clearance: 25.1 mL/min (by C-G formula based on SCr of 0.86 mg/dL). Liver Function Tests: Recent Labs  Lab 04/30/20 1822 05/01/20 1430 05/02/20 0152 05/03/20 0204  AST 184* 149* 89* 50*  ALT 131* 167* 122* 91*  ALKPHOS 164* 175* 139* 132*  BILITOT 0.8 0.7 0.8 0.6  PROT 7.1 7.1 5.9* 6.3*  ALBUMIN 3.5 3.3* 2.9* 3.0*   No results for input(s): LIPASE, AMYLASE in the last 168 hours. No results for input(s): AMMONIA in the last 168 hours. Coagulation Profile: No results for input(s): INR, PROTIME in  the last 168 hours. Cardiac Enzymes: No results for input(s): CKTOTAL, CKMB, CKMBINDEX, TROPONINI in the last 168 hours. BNP (last 3 results) No results for input(s): PROBNP in the last 8760 hours. HbA1C: No results for input(s): HGBA1C in the last 72 hours. CBG: No results for input(s): GLUCAP in the last 168 hours. Lipid Profile: No results for input(s): CHOL, HDL, LDLCALC, TRIG, CHOLHDL, LDLDIRECT in the last 72 hours. Thyroid Function Tests: Recent Labs    05/01/20 0913 05/02/20 1124  TSH 6.476*  --   FREET4  --  1.13*   Anemia Panel: No results for input(s): VITAMINB12, FOLATE, FERRITIN, TIBC, IRON, RETICCTPCT in the last 72 hours. Sepsis Labs: Recent Labs  Lab 05/02/20 0152  PROCALCITON 0.12    Recent Results (from the past 240 hour(s))  Resp Panel by RT-PCR (Flu A&B, Covid) Nasopharyngeal Swab     Status: None   Collection Time: 04/30/20  6:54 PM   Specimen: Nasopharyngeal Swab; Nasopharyngeal(NP) swabs in vial transport medium  Result Value Ref Range Status   SARS  Coronavirus 2 by RT PCR NEGATIVE NEGATIVE Final    Comment: (NOTE) SARS-CoV-2 target nucleic acids are NOT DETECTED.  The SARS-CoV-2 RNA is generally detectable in upper respiratory specimens during the acute phase of infection. The lowest concentration of SARS-CoV-2 viral copies this assay can detect is 138 copies/mL. A negative result does not preclude SARS-Cov-2 infection and should not be used as the sole basis for treatment or other patient management decisions. A negative result may occur with  improper specimen collection/handling, submission of specimen other than nasopharyngeal swab, presence of viral mutation(s) within the areas targeted by this assay, and inadequate number of viral copies(<138 copies/mL). A negative result must be combined with clinical observations, patient history, and epidemiological information. The expected result is Negative.  Fact Sheet for Patients:   EntrepreneurPulse.com.au  Fact Sheet for Healthcare Providers:  IncredibleEmployment.be  This test is no t yet approved or cleared by the [place] FDA and  has been authorized for detection and/or diagnosis of SARS-CoV-2 by FDA under an Emergency Use Authorization (EUA). This EUA will remain  in effect (meaning this test can be used) for the duration of the COVID-19 declaration under Section 564(b)(1) of the Act, 21 U.S.C.section 360bbb-3(b)(1), unless the authorization is terminated  or revoked sooner.       Influenza A by PCR NEGATIVE NEGATIVE Final   Influenza B by PCR NEGATIVE NEGATIVE Final    Comment: (NOTE) The Xpert Xpress SARS-CoV-2/FLU/RSV plus assay is intended as an aid in the diagnosis of influenza from Nasopharyngeal swab specimens and should not be used as a sole basis for treatment. Nasal washings and aspirates are unacceptable for Xpert Xpress SARS-CoV-2/FLU/RSV testing.  Fact Sheet for Patients: EntrepreneurPulse.com.au  Fact Sheet for Healthcare Providers: IncredibleEmployment.be  This test is not yet approved or cleared by the [place] FDA and has been authorized for detection and/or diagnosis of SARS-CoV-2 by FDA under an Emergency Use Authorization (EUA). This EUA will remain in effect (meaning this test can be used) for the duration of the COVID-19 declaration under Section 564(b)(1) of the Act, 21 U.S.C. section 360bbb-3(b)(1), unless the authorization is terminated or revoked.  Performed at [facility], [place] [address] [place], [place] [address]          Radiology Studies: CT ABDOMEN PELVIS WO CONTRAST  Result Date: 05/02/2020 CLINICAL DATA:  Abdominal distention EXAM: CT ABDOMEN AND PELVIS WITHOUT CONTRAST TECHNIQUE: Multidetector CT imaging of the abdomen and pelvis was performed following the standard protocol without IV contrast. COMPARISON:   11/06/2011 FINDINGS: Lower chest: Large right pleural effusion. Compressive atelectasis in the right lower lobe. Platelike scarring in the right middle lobe. Small left pleural effusion with left base atelectasis. Densely calcified mitral valve annulus and coronary arteries diffusely. Aortic atherosclerosis. Hepatobiliary: 3.9 cm cyst in the right hepatic dome. No biliary ductal dilatation. Gallbladder unremarkable. Pancreas: No focal abnormality or ductal dilatation. Spleen: Scattered calcifications.  Normal size. Adrenals/Urinary Tract: No adrenal abnormality. No focal renal abnormality. No stones or hydronephrosis. Urinary bladder is unremarkable. Stomach/Bowel: Sigmoid diverticulosis. No active diverticulitis. Stomach and small bowel decompressed, unremarkable. Left lower quadrant ostomy again noted, grossly unremarkable. Vascular/Lymphatic: Aortoiliac atherosclerosis. No evidence of aneurysm or adenopathy. Reproductive: Prior hysterectomy.  No adnexal masses. Other: No free fluid or free air. Musculoskeletal: Prior right hip replacement. Old left superior and inferior pubic rami fractures. Degenerative changes in the lumbar spine. No acute bony abnormality. IMPRESSION: Large right pleural effusion and small left pleural effusion. Bibasilar atelectasis. Cardiomegaly, coronary  artery disease, aortic atherosclerosis. Sigmoid diverticulosis. Left lower quadrant ostomy. No bowel obstruction. Electronically Signed   By: Rolm Baptise M.D.   On: 05/02/2020 18:44   DG Abd 1 View  Result Date: 05/02/2020 CLINICAL DATA:  Abdominal distention EXAM: ABDOMEN - 1 VIEW COMPARISON:  08/15/2009. FINDINGS: Nonobstructive bowel gas pattern. No organomegaly or free air. No suspicious calcification. Prior right hip replacement. Fractures noted through the left superior and inferior pubic rami. IMPRESSION: Left superior and inferior pubic rami fractures. No evidence of bowel obstruction or free air. Electronically Signed   By:  Rolm Baptise M.D.   On: 05/02/2020 16:18   DG Chest Port 1 View  Result Date: 05/03/2020 CLINICAL DATA:  Dyspnea. EXAM: PORTABLE CHEST 1 VIEW COMPARISON:  04/30/2020 FINDINGS: 0859 hours. The cardio pericardial silhouette is enlarged. Interval increase in right base collapse/consolidation with small right pleural effusion noted. Interstitial markings are diffusely coarsened with chronic features. Mitral annular calcification again noted. Bones are diffusely demineralized. With posttraumatic deformity of the left humeral neck incompletely visualized. Telemetry leads overlie the chest. IMPRESSION: Interval increase in right base collapse/consolidation with small right pleural effusion. Electronically Signed   By: Misty Stanley M.D.   On: 05/03/2020 09:07   ECHOCARDIOGRAM COMPLETE  Result Date: 05/01/2020    ECHOCARDIOGRAM REPORT   Patient Name:   [name] [facility] Date of Exam: 05/01/2020 Medical Rec #:  [id number]       Height:       63.0 in Accession #:    [id number]      Weight:       103.2 lb Date of Birth:  [birth year]        BSA:          1.460 m Patient Age:    87 years       BP:           103/53 mmHg Patient Gender: F               HR:           84 bpm. Exam Location:  Inpatient Procedure: 2D Echo, Cardiac Doppler and Color Doppler Indications:    Cardiomyopathy-Unspecified I42.9  History:        Patient has prior history of Echocardiogram examinations, most                 recent 02/23/2017. CAD and Previous Myocardial Infarction, TIA,                 Arrythmias:Atrial Flutter and Atrial Fibrillation; Risk                 Factors:Hypertension and Dyslipidemia. First degree heart block                 (From Hx), Heart palpitations (From Hx), S/P coronary artery                 stent placement (From Hx).  Sonographer:    Alvino Chapel RCS Referring Phys: [id number] ANKIT CHIRAG AMIN IMPRESSIONS  1. Left ventricular ejection fraction, by estimation, is 60 to 65%. The left ventricle has normal function. The left  ventricle has no regional wall motion abnormalities. There is mild concentric left ventricular hypertrophy. Left ventricular diastolic function could not be evaluated. There is the interventricular septum is flattened in systole and diastole, consistent with right ventricular pressure and volume overload.  2. Right ventricular systolic function is normal. The right ventricular size is mildly enlarged. There is mildly  elevated pulmonary artery systolic pressure. The estimated right ventricular systolic pressure is 44.0 mmHg.  3. Left atrial size was moderately dilated.  4. Right atrial size was severely dilated.  5. The mitral valve is normal in structure. Moderate mitral valve regurgitation. No evidence of mitral stenosis. Severe mitral annular calcification.  6. Tricuspid valve regurgitation is severe.  7. The aortic valve is normal in structure. There is mild calcification of the aortic valve. There is mild thickening of the aortic valve. Aortic valve regurgitation is mild to moderate. Mild to moderate aortic valve sclerosis/calcification is present, without any evidence of aortic stenosis.  8. The inferior vena cava is dilated in size with <50% respiratory variability, suggesting right atrial pressure of 15 mmHg.  9. Evidence of atrial level shunting detected by color flow Doppler. There is a small secundum atrial septal defect with predominantly left to right shunting across the atrial septum. FINDINGS  Left Ventricle: Left ventricular ejection fraction, by estimation, is 60 to 65%. The left ventricle has normal function. The left ventricle has no regional wall motion abnormalities. The left ventricular internal cavity size was normal in size. There is  mild concentric left ventricular hypertrophy. The interventricular septum is flattened in systole and diastole, consistent with right ventricular pressure and volume overload. Left ventricular diastolic function could not be evaluated due to atrial fibrillation.  Left ventricular diastolic function could not be evaluated. Right Ventricle: The right ventricular size is mildly enlarged. No increase in right ventricular wall thickness. Right ventricular systolic function is normal. There is mildly elevated pulmonary artery systolic pressure. The tricuspid regurgitant velocity is 2.60 m/s, and with an assumed right atrial pressure of 15 mmHg, the estimated right ventricular systolic pressure is 10.2 mmHg. Left Atrium: Left atrial size was moderately dilated. Right Atrium: Right atrial size was severely dilated. Pericardium: There is no evidence of pericardial effusion. Mitral Valve: The mitral valve is normal in structure. There is mild thickening of the mitral valve leaflet(s). There is mild calcification of the mitral valve leaflet(s). Severe mitral annular calcification. Moderate mitral valve regurgitation, with posteriorly-directed jet. No evidence of mitral valve stenosis. Tricuspid Valve: The tricuspid valve is normal in structure. Tricuspid valve regurgitation is severe. No evidence of tricuspid stenosis. Aortic Valve: The aortic valve is normal in structure. There is mild calcification of the aortic valve. There is mild thickening of the aortic valve. Aortic valve regurgitation is mild to moderate. Aortic regurgitation PHT measures 434 msec. Mild to moderate aortic valve sclerosis/calcification is present, without any evidence of aortic stenosis. Pulmonic Valve: The pulmonic valve was normal in structure. Pulmonic valve regurgitation is not visualized. No evidence of pulmonic stenosis. Aorta: The aortic root is normal in size and structure. Venous: The inferior vena cava is dilated in size with less than 50% respiratory variability, suggesting right atrial pressure of 15 mmHg. IAS/Shunts: Evidence of atrial level shunting detected by color flow Doppler. There is a small secundum atrial septal defect with predominantly left to right shunting across the atrial septum.   LEFT VENTRICLE PLAX 2D LVIDd:         3.40 cm LVIDs:         2.20 cm LV PW:         0.90 cm LV IVS:        1.00 cm LVOT diam:     1.60 cm LV SV:         28 LV SV Index:   19 LVOT Area:     2.01  cm  RIGHT VENTRICLE TAPSE (M-mode): 2.3 cm LEFT ATRIUM             Index       RIGHT ATRIUM           Index LA diam:        3.90 cm 2.67 cm/m  RA Area:     25.50 cm LA Vol (A2C):   98.2 ml 67.28 ml/m RA Volume:   88.60 ml  60.70 ml/m LA Vol (A4C):   83.9 ml 57.48 ml/m LA Biplane Vol: 96.0 ml 65.77 ml/m  AORTIC VALVE LVOT Vmax:   63.50 cm/s LVOT Vmean:  45.000 cm/s LVOT VTI:    0.137 m AI PHT:      434 msec  AORTA Ao Root diam: 3.20 cm MITRAL VALVE                TRICUSPID VALVE MV Area (PHT): 5.54 cm     TR Peak grad:   27.0 mmHg MV Decel Time: 137 msec     TR Vmax:        260.00 cm/s MV E velocity: 155.00 cm/s                             SHUNTS                             Systemic VTI:  0.14 m                             Systemic Diam: 1.60 cm Ena Dawley MD Electronically signed by Ena Dawley MD Signature Date/Time: 05/01/2020/11:59:17 PM    Final         Scheduled Meds: . amiodarone  100 mg Oral Daily  . aspirin EC  81 mg Oral Daily  . furosemide  20 mg Intravenous Once  . furosemide  40 mg Oral Daily  . heparin  5,000 Units Subcutaneous Q8H  . melatonin  5 mg Oral QHS  . metoprolol succinate  12.5 mg Oral BID  . polyethylene glycol  17 g Oral Daily  . senna-docusate  1 tablet Oral BID  . sertraline  25 mg Oral QHS  . sodium chloride flush  3 mL Intravenous Q12H   Continuous Infusions:   LOS: 0 days   Time spent= 35 mins    Ankit Arsenio Loader, MD [facility]  If 7PM-7AM, please contact night-coverage  05/03/2020, 12:06 PM

## 2020-05-03 NOTE — Progress Notes (Signed)
Occupational Therapy Treatment Patient Details Name: [name] MRN: [id number] DOB: 09-05-1916 Today's Date: 05/03/2020    History of present illness Patient is a 87 y/o female who presents with SOB and weakness on 05/01/20. CXR- hyperinflated lungs, RLL airspace disease with pleural effusion. Admitted with A-fib with RVR and acute on chronic CHF. PMH includes HTN, MI, stent placement, A-fib/flutter, dizziness, rectal ca with colostomy, left humeral fx and pubic rami fxs.   OT comments  Pt making steady progress towards OT goals this session. Session focus on practicing with LB AE for dressing as pt reports difficulty reaching LB. Pt currently requires MOD A to sequence using sock aid to don socks. Education provided on use of LH shoe horn as well as using reacher to doff socks as needed. Pt would continue to benefit from skilled occupational therapy while admitted and after d/c to address the below listed limitations in order to improve overall functional mobility and facilitate independence with BADL participation. DC plan remains appropriate, will follow acutely per POC.     Follow Up Recommendations  Home health OT;Supervision - Intermittent    Equipment Recommendations  None recommended by OT    Recommendations for Other Services      Precautions / Restrictions Precautions Precautions: Fall;Other (comment) Precaution Comments: watch 02/HR; HoH and hearing aide battery dead Restrictions Weight Bearing Restrictions: No       Mobility Bed Mobility               General bed mobility comments: pt recieved in recliner and left up in recliner    Transfers                      Balance                                           ADL either performed or assessed with clinical judgement   ADL Overall ADL's : Needs assistance/impaired                     Lower Body Dressing: Moderate assistance;With adaptive equipment Lower Body  Dressing Details (indicate cue type and reason): MOD A needed to sequence novel task of using sock aid to don socks from long sitting, education also provided on reacher and LH shoe horn for LB dressing, pt thinks she has sock aid at home but issued pt handout of where to purchase items               General ADL Comments: session focus on praciting with LB AE as pt reports impaired ability to reach LB     Vision       Perception     Praxis      Cognition Arousal/Alertness: Awake/alert;Lethargic (initially lethargic, but aroused more as session progressed, very tired again at end of session) Behavior During Therapy: [facility] for tasks assessed/performed Overall Cognitive Status: Within Functional Limits for tasks assessed                                          Exercises     Shoulder Instructions       General Comments pt on 1L during session with sats >/= 98% during session , issued pt handout on where to purchase  LB AE items. education provided on tying fall risk bag on RW to use as reacher bag as pt reporting difficulty keeping reacher close by    Pertinent Vitals/ Pain       Pain Assessment: No/denies pain  Home Living                                          Prior Functioning/Environment              Frequency  Min 2X/week        Progress Toward Goals  OT Goals(current goals can now be found in the care plan section)  Progress towards OT goals: Progressing toward goals  Acute Rehab OT Goals Patient Stated Goal: return home Time For Goal Achievement: 05/16/20 Potential to Achieve Goals: Good  Plan Discharge plan remains appropriate;Frequency remains appropriate    Co-evaluation                 AM-PAC OT "6 Clicks" Daily Activity     Outcome Measure   Help from another person eating meals?: None Help from another person taking care of personal grooming?: A Little   Help from another person bathing  (including washing, rinsing, drying)?: A Little Help from another person to put on and taking off regular upper body clothing?: A Little Help from another person to put on and taking off regular lower body clothing?: A Little 6 Click Score: 16    End of Session Equipment Utilized During Treatment: Oxygen;Other (comment) (1L [place]; sock aid, reacher and LH shoe horn)  OT Visit Diagnosis: Unsteadiness on feet (R26.81);Other abnormalities of gait and mobility (R26.89)   Activity Tolerance Patient tolerated treatment well   Patient Left in chair;with call bell/phone within reach;with chair alarm set   Nurse Communication Mobility status        Time: 8127-5170 OT Time Calculation (min): 24 min  Charges: OT General Charges $OT Visit: 1 Visit OT Treatments $Self Care/Home Management : 23-37 mins  Harley Alto., COTA/L Acute Rehabilitation Services [phone]    Precious Haws 05/03/2020, 4:44 PM

## 2020-05-03 NOTE — Progress Notes (Signed)
Mobility Specialist - Progress Note   05/03/20 1333  Mobility  Activity Refused mobility   Pt states she is feeling fatigued as she just got settled in chair after being bathed by NT, will f/u as able.   Pricilla Handler Mobility Specialist Mobility Specialist Phone: [phone]

## 2020-05-03 NOTE — Progress Notes (Signed)
Physical Therapy Treatment Patient Details Name: [name] MRN: [id number] DOB: [birth year] Today's Date: 05/03/2020    History of Present Illness Patient is a 87 y/o female who presents with SOB and weakness on 05/01/20. CXR- hyperinflated lungs, RLL airspace disease with pleural effusion. Admitted with A-fib with RVR and acute on chronic CHF. PMH includes HTN, MI, stent placement, A-fib/flutter, dizziness, rectal ca with colostomy, left humeral fx and pubic rami fxs.    PT Comments    Pt received in supine, agreeable to therapy session with encouragement and with good participation and tolerance for mobility. Pt remained on 1L O2  during ambulation however with poor pleth reading (WNL resting and despite pleth switched to opp hand, poor signal), HR WNL and BP stable with no dizziness. Pt performed seated balance/reaching activity EOB and fair standing balance at sink, but used RW for safety during gait. Pt slow gait speed <0.3 m/s indicates and increased risk of falls for household ambulation tasks and she would benefit from increased supervision at home if available. Pt continues to benefit from PT services to progress toward functional mobility goals. DC recs below remain appropriate.  Follow Up Recommendations  Home health PT;Supervision - Intermittent     Equipment Recommendations  Other (comment) (may have supplemental O2 needs)    Recommendations for Other Services       Precautions / Restrictions Precautions Precautions: Fall;Other (comment) Precaution Comments: watch 02/HR; HoH and hearing aide battery dead Restrictions Weight Bearing Restrictions: No    Mobility  Bed Mobility Overal bed mobility: Needs Assistance Bed Mobility: Supine to Sit     Supine to sit: Supervision     General bed mobility comments: flat HOB, no rails per home setup; assist with line mgmt only    Transfers Overall transfer level: Needs assistance Equipment used: Rolling walker (2  wheeled) Transfers: Sit to/from Stand Sit to Stand: Min guard         General transfer comment: slow to rise, no physical assist from EOB to RW and RW to chair  Ambulation/Gait Ambulation/Gait assistance: Supervision Gait Distance (Feet): 100 Feet Assistive device: Rolling walker (2 wheeled) Gait Pattern/deviations: Step-through pattern;Decreased stride length Gait velocity: decreased Gait velocity interpretation: <1.31 ft/sec, indicative of household ambulator General Gait Details: cues for line mgmt; poor pleth reading throughout despite switching pleth to other hand and putting hands in warm water at sink, SpO2 WNL resting prior to mobility and no DOE; HR 55 bpm resting and 85 bpm during gait   Stairs             Wheelchair Mobility    Modified Rankin (Stroke Patients Only)       Balance Overall balance assessment: History of Falls;Needs assistance Sitting-balance support: Feet supported;No upper extremity supported Sitting balance-Leahy Scale: Good     Standing balance support: During functional activity Standing balance-Leahy Scale: Poor Standing balance comment: can release walker in static standing, no LOB       Cognition Arousal/Alertness: Awake/alert Behavior During Therapy: WFL for tasks assessed/performed Overall Cognitive Status: Within Functional Limits for tasks assessed    General Comments: age appropriate, Oriented x4; HoH      Exercises      General Comments General comments (skin integrity, edema, etc.): see gait comments      Pertinent Vitals/Pain Pain Assessment: No/denies pain    Home Living   Prior Function    PT Goals (current goals can now be found in the care plan section) Acute Rehab PT Goals  Patient Stated Goal: return home PT Goal Formulation: With patient Time For Goal Achievement: 05/16/20 Potential to Achieve Goals: Good Progress towards PT goals: Progressing toward goals    Frequency    Min 3X/week       PT Plan Current plan remains appropriate    Co-evaluation              AM-PAC PT "6 Clicks" Mobility   Outcome Measure  Help needed turning from your back to your side while in a flat bed without using bedrails?: None Help needed moving from lying on your back to sitting on the side of a flat bed without using bedrails?: A Little Help needed moving to and from a bed to a chair (including a wheelchair)?: A Little Help needed standing up from a chair using your arms (e.g., wheelchair or bedside chair)?: A Little Help needed to walk in hospital room?: A Little Help needed climbing 3-5 steps with a railing? : A Little 6 Click Score: 19    End of Session Equipment Utilized During Treatment: Oxygen;Gait belt Activity Tolerance: Patient tolerated treatment well Patient left: in chair;with call bell/phone within reach;with chair alarm set Nurse Communication: Mobility status PT Visit Diagnosis: Muscle weakness (generalized) (M62.81);Difficulty in walking, not elsewhere classified (R26.2)     Time: 1123-1201 PT Time Calculation (min) (ACUTE ONLY): 38 min  Charges:  $Gait Training: 23-37 mins $Therapeutic Activity: 8-22 mins                     Chiniqua Kilcrease P., PTA Acute Rehabilitation Services Pager: [phone] Office: [place] 05/03/2020, 12:11 PM

## 2020-05-03 NOTE — Consult Note (Signed)
[place] Nurse ostomy consult note Stoma type/location: LLQ colostomy.  Patient reports she has had since 2012 and that it was performed in [place] at [facility]. She reports being independent in her ostomy care at home Stomal assessment/size: 1 and 3/8 inch stoma that is mobile Peristomal assessment: intact with bulging parastomal hernia (small) in the peristomal plane Treatment options for stomal/peristomal skin: skin barrier ring Output: liquid light brown stool Ostomy pouching: 2pc. 2 and 3/4 inch ostomy pouching system plus skin barrier ring Education provided: Answered questions regarding her hernia, she states it has been there a couple of months and the mobility of her stoma, which is also fairly recent. Patient states she would rather "put up with" a few extra pouch changes than have any corrective surgery, if at all possible.  Supply orders placed for pouching supplies:  Pouches are [employer], Skin barriers are Kellie Simmering #2 and Skin barrier rings are Kellie Simmering # [phone] Enrolled patient in [place] program: No. Patient uses [facility] and is pleased with their service.  [place] nursing team will not follow, but will remain available to this patient, the nursing and medical teams.  Please re-consult if needed. Thanks, Maudie Flakes, MSN, RN, [place], Arther Abbott  Pager# [phone]

## 2020-05-04 ENCOUNTER — Other Ambulatory Visit: Payer: Self-pay | Admitting: Internal Medicine

## 2020-05-04 LAB — COMPREHENSIVE METABOLIC PANEL
ALT: 71 U/L — ABNORMAL HIGH (ref 0–44)
AST: 32 U/L (ref 15–41)
Albumin: 3.1 g/dL — ABNORMAL LOW (ref 3.5–5.0)
Alkaline Phosphatase: 129 U/L — ABNORMAL HIGH (ref 38–126)
Anion gap: 7 (ref 5–15)
BUN: 25 mg/dL — ABNORMAL HIGH (ref 8–23)
CO2: 29 mmol/L (ref 22–32)
Calcium: 9.5 mg/dL (ref 8.9–10.3)
Chloride: 99 mmol/L (ref 98–111)
Creatinine, Ser: 1.07 mg/dL — ABNORMAL HIGH (ref 0.44–1.00)
GFR, Estimated: 46 mL/min — ABNORMAL LOW (ref >60)
Glucose, Bld: 98 mg/dL (ref 70–99)
Potassium: 3.9 mmol/L (ref 3.5–5.1)
Sodium: 135 mmol/L (ref 135–145)
Total Bilirubin: 0.9 mg/dL (ref 0.3–1.2)
Total Protein: 6.5 g/dL (ref 6.5–8.1)

## 2020-05-04 LAB — MAGNESIUM: Magnesium: 2.1 mg/dL (ref 1.7–2.4)

## 2020-05-04 LAB — CBC
HCT: 36 % (ref 36.0–46.0)
Hemoglobin: 11.7 g/dL — ABNORMAL LOW (ref 12.0–15.0)
MCH: 31.5 pg (ref 26.0–34.0)
MCHC: 32.5 g/dL (ref 30.0–36.0)
MCV: 96.8 fL (ref 80.0–100.0)
Platelets: 244 10*3/uL (ref 150–400)
RBC: 3.72 MIL/uL — ABNORMAL LOW (ref 3.87–5.11)
RDW: 12.8 % (ref 11.5–15.5)
WBC: 7.4 10*3/uL (ref 4.0–10.5)
nRBC: 0 % (ref 0.0–0.2)

## 2020-05-04 MED ORDER — POLYETHYLENE GLYCOL 3350 17 G PO PACK: 17.0000 g | PACK | Freq: Every day | ORAL | 0 refills | Status: DC | PRN

## 2020-05-04 MED ORDER — SENNOSIDES-DOCUSATE SODIUM 8.6-50 MG PO TABS: 1.0000 | ORAL_TABLET | Freq: Two times a day (BID) | ORAL | 0 refills | Status: DC | PRN

## 2020-05-04 NOTE — TOC Transition Note (Signed)
Transition of Care (TOC) - CM/SW Discharge Note Marvetta Gibbons RN, BSN Transitions of Care Unit 4E- RN Case Manager See Treatment Team for direct phone #    Patient Details  Name: [name] MRN: [id number] Date of Birth: 1916-08-30  Transition of Care [facility]) CM/SW Contact:  Dawayne Patricia, RN Phone Number: 05/04/2020, 9:57 AM   Clinical Narrative:    Pt stable for transition home today, orders placed for HHRN/PT/OT, CM spoke with pt at bedside. Per pt she has needed DME at home- uses walker most of the time. Lives at home alone, family nearby to assist.  Discussed [place] orders- pt agreeable to services. List provided for choice Per CMS guidelines from medicare.gov website with star ratings (copy placed in shadow chart), per pt she does not have a preference and does not remember name of agency she has used in past. She states she will defer to this writer to secure an agency on her behalf. Discussed using [facility] which pt was agreeable with.  Address, phone # and PCP all confirmed with pt in epic. Gold DNR returned to pt that was brought from home.   Call made to [facility] with [facility] for [facility] needs- referral has been accepted.    Final next level of care: [place] Barriers to Discharge: No Barriers Identified   Patient Goals and CMS Choice Patient states their goals for this hospitalization and ongoing recovery are:: "to be able to get in my kitchen and do some cooking" CMS Medicare.gov Compare Post Acute Care list provided to:: Patient Choice offered to / list presented to : Patient  Discharge Placement               Home with [facility]        Discharge Plan and Services   Discharge Planning Services: CM Consult Post Acute Care Choice: Home Health          DME Arranged: N/A DME Agency: NA       HH Arranged: RN,Disease Management,PT,OT HH Agency: [place] Date [facility] Agency Contacted: 05/04/20 Time [place]: [phone] Representative  spoke with at [place]: [place] ([place]) Interventions     Readmission Risk Interventions Readmission Risk Prevention Plan 05/04/2020  Transportation Screening Complete  PCP or Specialist Appt within 5-7 Days Complete  Home Care Screening Complete  Medication Review (RN CM) Complete  Some recent data might be hidden

## 2020-05-04 NOTE — Plan of Care (Signed)
  Problem: Acute Rehab OT Goals (only OT should resolve) Goal: Pt. Will Perform Grooming Outcome: Adequate for Discharge Goal: Pt. Will Perform Lower Body Bathing Outcome: Adequate for Discharge Goal: Pt. Will Perform Lower Body Dressing Outcome: Adequate for Discharge Goal: Pt. Will Transfer To Toilet Outcome: Adequate for Discharge Goal: Pt. Will Perform Toileting-Clothing Manipulation Outcome: Adequate for Discharge

## 2020-05-04 NOTE — Progress Notes (Signed)
Discharge instructions (including medications) discussed with and copy provided to patient/caregiver 

## 2020-05-04 NOTE — Progress Notes (Signed)
Physical Therapy Treatment Patient Details Name: [name] MRN: [id number] DOB: [birth year] Today's Date: 05/04/2020    History of Present Illness Patient is a 87 y/o female who presents with SOB and weakness on 05/01/20. CXR- hyperinflated lungs, RLL airspace disease with pleural effusion. Admitted with A-fib with RVR and acute on chronic CHF. PMH includes HTN, MI, stent placement, A-fib/flutter, dizziness, rectal ca with colostomy, left humeral fx and pubic rami fxs.    PT Comments    Pt received in supine, agreeable to therapy session but c/o feeling cold and fatigued after poor evening of sleep, but good participation and tolerance for mobility. Pt progressed gait distance to 163ft using RW and Supervision, no DOE and fair use of RW. Pt remains Supervision for bed mobility and transfers with increased time to perform and some verbal cues needed for safety. Pt agreeable to sit up in chair with more blankets to stay warm at end of session. Pt continues to benefit from PT services to progress toward functional mobility goals. Continue to recommend HHPT.   Follow Up Recommendations  Home health PT;Supervision - Intermittent     Equipment Recommendations  None recommended by PT    Recommendations for Other Services       Precautions / Restrictions Precautions Precautions: Fall;Other (comment) Precaution Comments: watch 02/HR; HoH has L hearing aid Restrictions Weight Bearing Restrictions: No    Mobility  Bed Mobility Overal bed mobility: Needs Assistance Bed Mobility: Supine to Sit     Supine to sit: Supervision     General bed mobility comments: flat HOB, no rails per home setup; assist with line mgmt only    Transfers Overall transfer level: Needs assistance Equipment used: Rolling walker (2 wheeled) Transfers: Sit to/from Stand Sit to Stand: Min guard         General transfer comment: slow to rise, no physical assist from EOB to RW and RW to chair but cues for  safe hand placement  Ambulation/Gait Ambulation/Gait assistance: Supervision Gait Distance (Feet): 130 Feet Assistive device: Rolling walker (2 wheeled) Gait Pattern/deviations: Step-through pattern;Decreased stride length Gait velocity: decreased   General Gait Details: cues for line mgmt and RW proximity needed; poor pleth reading but SpO2 WNL resting prior to/post mobility on RA and no DOE; HR 55 bpm resting   Stairs             Wheelchair Mobility    Modified Rankin (Stroke Patients Only)       Balance Overall balance assessment: History of Falls;Needs assistance Sitting-balance support: Feet supported;No upper extremity supported Sitting balance-Leahy Scale: Good     Standing balance support: During functional activity Standing balance-Leahy Scale: Poor Standing balance comment: can release walker in static standing, no LOB                            Cognition Arousal/Alertness: Awake/alert Behavior During Therapy: WFL for tasks assessed/performed Overall Cognitive Status: Within Functional Limits for tasks assessed                                 General Comments: age appropriate, Oriented x4; reports increased fatigue due to poor sleep overnight but participatory      Exercises      General Comments        Pertinent Vitals/Pain Pain Assessment: No/denies pain    Home Living  Prior Function            PT Goals (current goals can now be found in the care plan section) Acute Rehab PT Goals Patient Stated Goal: return home PT Goal Formulation: With patient Time For Goal Achievement: 05/16/20 Potential to Achieve Goals: Good Progress towards PT goals: Progressing toward goals    Frequency    Min 3X/week      PT Plan Current plan remains appropriate    Co-evaluation              AM-PAC PT "6 Clicks" Mobility   Outcome Measure  Help needed turning from your back to your  side while in a flat bed without using bedrails?: None Help needed moving from lying on your back to sitting on the side of a flat bed without using bedrails?: A Little Help needed moving to and from a bed to a chair (including a wheelchair)?: A Little Help needed standing up from a chair using your arms (e.g., wheelchair or bedside chair)?: A Little Help needed to walk in hospital room?: A Little Help needed climbing 3-5 steps with a railing? : A Little 6 Click Score: 19    End of Session Equipment Utilized During Treatment: Gait belt Activity Tolerance: Patient tolerated treatment well Patient left: in chair;with call bell/phone within reach;with chair alarm set Nurse Communication: Mobility status PT Visit Diagnosis: Muscle weakness (generalized) (M62.81);Difficulty in walking, not elsewhere classified (R26.2)     Time: 9774-1423 PT Time Calculation (min) (ACUTE ONLY): 29 min  Charges:  $Gait Training: 8-22 mins $Therapeutic Activity: 8-22 mins                     Enrigue Hashimi P., PTA Acute Rehabilitation Services Pager: [phone] Office: [place] 05/04/2020, 11:07 AM

## 2020-05-04 NOTE — Discharge Summary (Signed)
Physician Discharge Summary  Roseline Ebarb Mullings TIR:[id number] DOB: [birth year] DOA: 04/30/2020  PCP: Leonard Downing, MD  Admit date: 04/30/2020 Discharge date: 05/04/2020  Admitted From: Home Disposition:  Home with [facility]  Recommendations for Outpatient Follow-up:  1. Follow up with PCP in 1-2 weeks 2. Please obtain BMP/CBC in one week your next doctors visit.  3. Bowel regimen prescribed 4. Continue home cardiac meds. Patient is both on Toprol and Amiodarone, confirmed.  5. Follow up outpatient Cardiology.   Home Health: PT/OT/RN Discharge Condition: Stable CODE STATUS: DNR Diet recommendation: Cardiac  Brief/Interim Summary: 89 with past medical history of paroxysmal A. fib not on AC, diastolic CHF EF 60 to 67%, grade 3 DD, CAD status post PCI with BMS 6 2, first-degree AV block, rectal cancer status post proctectomy with colostomy, HTN admitted for shortness of breath and generalized weakness.  Ongoing for past several weeks with chronic dyspnea on exertion.  Upon admission noted to be in A. fib with RVR and volume overload.  Echocardiogram shows EF 60 to 65% with dilated cardiomyopathy, severe TR, moderate MR. Her HR was controlled with toprol and amiodarone. She was seen by PT, recommened HH.    Paroxysmal atrial fibrillation/flutter with RVR: -HR improved.  Not on anticoagulation due to risk of falling.   -Home Toprol 12.5 mg twice daily.  Amiodarone 100mg  po daily.  -PT/OT-home health  Acute congestive heart failure with preserved ejection fraction, EF 60 to 65%.  Class I Acute hypoxia secondary to CHF -Echocardiogram 05/01/2020-EF 60 to 61%, grade 3 diastolic dysfunction, severe TR, moderate MR. Now on Room Air.  -Monitor electrolytes.  Strict input and output and daily weights -Deemed not to be a good candidate for cardiac cath due to her age -Cont home lasix daily.   Elevated LFTs, improving.  Cirrhosis with evidence of portal hypertension and ascites.  --Right upper  quadrant ultrasound shows cirrhosis with evidence of portal hypertension and ascites.  Possibly cardiac cirrhosis? -Discussed this with patient's niece and the patient-does not want any further investigation at this time therefore hold off on any further work-up or liver biopsy.  Mild renal insufficiency, improving -Resvoled.   CAD s/p PCI with BMS x2: -Currently patient is chest pain-free.  Continue aspirin  Hypertension: -Stable.  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  PT/OT eval-home health  Mildprotein calorie malnutrition -Encourage oral diet   Body mass index is 16.46 kg/m.         Discharge Diagnoses:  Principal Problem:   Paroxysmal atrial fibrillation with rapid ventricular response (HCC) Active Problems:   CAD (coronary artery disease)   HTN (hypertension)   Acute on chronic diastolic CHF (congestive heart failure) (HCC)   Elevated LFTs   AKI (acute kidney injury) ([place])   Other cirrhosis of liver (HCC)   Atrial fibrillation with RVR ([place])      Consultations:  None  Subjective: Feels better, wants to go home.  Does feel like she needs to have a BM.   Discharge Exam: Vitals:   05/04/20 0330 05/04/20 0807  BP: (!) 156/59 (!) 135/93  Pulse: (!) 53 66  Resp: 17 14  Temp: 97.8 F (36.6 C) 97.8 F (36.6 C)  SpO2: 96% 97%   Vitals:   05/03/20 2011 05/03/20 2342 05/04/20 0330 05/04/20 0807  BP: (!) 129/55 (!) 124/52 (!) 156/59 (!) 135/93  Pulse: (!) 56 Marland Kitchen)  52 (!) 53 66  Resp: 18 16 17 14   Temp: 98.2 F (36.8 C) 98.2 F (36.8 C) 97.8 F (36.6 C) 97.8 F (36.6 C)  TempSrc: Oral Oral Oral Oral  SpO2: 99% 93% 96% 97%  Weight:   42.1 kg     General: Pt is alert, awake, not in acute distress. Elderly frail Cardiovascular: RRR, S1/S2 +, no rubs, no gallops Respiratory: CTA  bilaterally, no wheezing, no rhonchi Abdominal: Soft, NT, ND, bowel sounds + Extremities: no edema, no cyanosis Ostomy bag full of stool   Discharge Instructions   Allergies as of 05/04/2020      Reactions   Effexor [venlafaxine Hydrochloride] Other (See Comments)   Reaction not recalled by the patient   Penicillins Shortness Of Breath   Has patient had a PCN reaction causing immediate rash, facial/tongue/throat swelling, SOB or lightheadedness with hypotension: Yes Has patient had a PCN reaction causing severe rash involving mucus membranes or skin necrosis: No Has patient had a PCN reaction that required hospitalization: No Has patient had a PCN reaction occurring within the last 10 years: No If all of the above answers are "NO", then may proceed with Cephalosporin use.   Sulfa Antibiotics Nausea Only   Dicyclomine Other (See Comments)   Caused confusion   Lactose Intolerance (gi) Diarrhea   Hydrocodone Nausea Only      Medication List    TAKE these medications   acetaminophen 500 MG tablet Commonly known as: TYLENOL Take 2 tablets (1,000 mg total) by mouth every 6 (six) hours as needed for mild pain.   amiodarone 200 MG tablet Commonly known as: PACERONE Take 0.5 tablets (100 mg total) by mouth daily.   aspirin EC 81 MG tablet Take 81 mg by mouth daily.   beta carotene w/minerals tablet Take 1 tablet by mouth daily.   BIOTIN PO Take 1 tablet by mouth daily.   furosemide 40 MG tablet Commonly known as: LASIX Take 40 mg by mouth daily. Per patient taking 1/2 tablet   melatonin 5 MG Tabs Take 5 mg by mouth at bedtime.   metoprolol succinate 25 MG 24 hr tablet Commonly known as: Toprol XL Take 0.5 tablets (12.5 mg total) by mouth at bedtime.   nitroGLYCERIN 0.4 MG SL tablet Commonly known as: NITROSTAT Place 0.4 mg under the tongue every 5 (five) minutes as needed for chest pain.   omeprazole 20 MG capsule Commonly known as: PRILOSEC Take 20 mg by mouth  daily.   Polyethyl Glycol-Propyl Glycol 0.4-0.3 % Gel ophthalmic gel Commonly known as: SYSTANE Place 1 drop into both eyes at bedtime.   polyethylene glycol 17 g packet Commonly known as: MIRALAX / GLYCOLAX Take 17 g by mouth daily as needed for moderate constipation or severe constipation.   senna-docusate 8.6-50 MG tablet Commonly known as: Senokot-S Take 1 tablet by mouth 2 (two) times daily as needed for mild constipation.   sertraline 25 MG tablet Commonly known as: ZOLOFT Take 25 mg by mouth at bedtime.   Vitamin D 50 MCG (2000 UT) tablet Take 2,000 Units by mouth daily.       Follow-up Information    Leonard Downing, MD. Schedule an appointment as soon as possible for a visit in 1 week(s).   Specialty: Family Medicine Contact information: [place] [address] [phone]        Burnell Blanks, MD .   Specialty: Cardiology Contact information: [place]. [place]  [place], [facility] Follow up.   Specialty: [place] Why: HHRN/PT/OT arranged- they will contact you to set up home visits Contact information: [address] [place] [address] [phone]              Allergies  Allergen Reactions  . Effexor [Venlafaxine Hydrochloride] Other (See Comments)    Reaction not recalled by the patient  . Penicillins Shortness Of Breath    Has patient had a PCN reaction causing immediate rash, facial/tongue/throat swelling, SOB or lightheadedness with hypotension: Yes Has patient had a PCN reaction causing severe rash involving mucus membranes or skin necrosis: No Has patient had a PCN reaction that required hospitalization: No Has patient had a PCN reaction occurring within the last 10 years: No If all of the above answers are "NO", then may proceed with Cephalosporin use.   . Sulfa Antibiotics Nausea Only  . Dicyclomine Other (See  Comments)    Caused confusion  . Lactose Intolerance (Gi) Diarrhea  . Hydrocodone Nausea Only    You were cared for by a hospitalist during your hospital stay. If you have any questions about your discharge medications or the care you received while you were in the hospital after you are discharged, you can call the unit and asked to speak with the hospitalist on call if the hospitalist that took care of you is not available. Once you are discharged, your primary care physician will handle any further medical issues. Please note that no refills for any discharge medications will be authorized once you are discharged, as it is imperative that you return to your primary care physician (or establish a relationship with a primary care physician if you do not have one) for your aftercare needs so that they can reassess your need for medications and monitor your lab values.   Procedures/Studies: CT ABDOMEN PELVIS WO CONTRAST  Result Date: 05/02/2020 CLINICAL DATA:  Abdominal distention EXAM: CT ABDOMEN AND PELVIS WITHOUT CONTRAST TECHNIQUE: Multidetector CT imaging of the abdomen and pelvis was performed following the standard protocol without IV contrast. COMPARISON:  11/06/2011 FINDINGS: Lower chest: Large right pleural effusion. Compressive atelectasis in the right lower lobe. Platelike scarring in the right middle lobe. Small left pleural effusion with left base atelectasis. Densely calcified mitral valve annulus and coronary arteries diffusely. Aortic atherosclerosis. Hepatobiliary: 3.9 cm cyst in the right hepatic dome. No biliary ductal dilatation. Gallbladder unremarkable. Pancreas: No focal abnormality or ductal dilatation. Spleen: Scattered calcifications.  Normal size. Adrenals/Urinary Tract: No adrenal abnormality. No focal renal abnormality. No stones or hydronephrosis. Urinary bladder is unremarkable. Stomach/Bowel: Sigmoid diverticulosis. No active diverticulitis. Stomach and small bowel  decompressed, unremarkable. Left lower quadrant ostomy again noted, grossly unremarkable. Vascular/Lymphatic: Aortoiliac atherosclerosis. No evidence of aneurysm or adenopathy. Reproductive: Prior hysterectomy.  No adnexal masses. Other: No free fluid or free air. Musculoskeletal: Prior right hip replacement. Old left superior and inferior pubic rami fractures. Degenerative changes in the lumbar spine. No acute bony abnormality. IMPRESSION: Large right pleural effusion and small left pleural effusion. Bibasilar atelectasis. Cardiomegaly, coronary artery disease, aortic atherosclerosis. Sigmoid diverticulosis. Left lower quadrant ostomy. No bowel obstruction. Electronically Signed   By: Rolm Baptise M.D.   On: 05/02/2020 18:44   DG Abd 1 View  Result Date: 05/02/2020 CLINICAL DATA:  Abdominal distention EXAM: ABDOMEN - 1 VIEW COMPARISON:  08/15/2009. FINDINGS: Nonobstructive bowel gas pattern. No organomegaly or free air. No suspicious calcification. Prior  right hip replacement. Fractures noted through the left superior and inferior pubic rami. IMPRESSION: Left superior and inferior pubic rami fractures. No evidence of bowel obstruction or free air. Electronically Signed   By: Rolm Baptise M.D.   On: 05/02/2020 16:18   DG Chest Port 1 View  Result Date: 05/03/2020 CLINICAL DATA:  Dyspnea. EXAM: PORTABLE CHEST 1 VIEW COMPARISON:  04/30/2020 FINDINGS: 0859 hours. The cardio pericardial silhouette is enlarged. Interval increase in right base collapse/consolidation with small right pleural effusion noted. Interstitial markings are diffusely coarsened with chronic features. Mitral annular calcification again noted. Bones are diffusely demineralized. With posttraumatic deformity of the left humeral neck incompletely visualized. Telemetry leads overlie the chest. IMPRESSION: Interval increase in right base collapse/consolidation with small right pleural effusion. Electronically Signed   By: Misty Stanley M.D.   On:  05/03/2020 09:07   DG Chest Portable 1 View  Result Date: 04/30/2020 CLINICAL DATA:  Fatigue EXAM: PORTABLE CHEST 1 VIEW COMPARISON:  04/13/2020 FINDINGS: There is hyperinflation of the lungs compatible with COPD. Cardiomegaly. Patchy airspace disease throughout the right lung. Small right pleural effusion. No confluent opacity on the left. Areas of scarring bilaterally. No acute bony abnormality. IMPRESSION: Cardiomegaly.  COPD. Airspace disease in the right lower lobe with small right effusion. Findings concerning for pneumonia. Electronically Signed   By: Rolm Baptise M.D.   On: 04/30/2020 19:04   DG Chest Portable 1 View  Result Date: 04/13/2020 CLINICAL DATA:  Pain EXAM: PORTABLE CHEST 1 VIEW COMPARISON:  09/06/2019 FINDINGS: Chronic interstitial prominence. No new consolidation or edema. No pleural effusion or pneumothorax. Stable cardiomediastinal contours. Chronic left humeral fracture. IMPRESSION: No acute process in the chest. Electronically Signed   By: Macy Mis M.D.   On: 04/13/2020 12:58   ECHOCARDIOGRAM COMPLETE  Result Date: 05/01/2020    ECHOCARDIOGRAM REPORT   Patient Name:   Joyce FAILLA [facility] Date of Exam: 05/01/2020 Medical Rec #:  [id number]       Height:       63.0 in Accession #:    [id number]      Weight:       103.2 lb Date of Birth:  [birth year]        BSA:          1.460 m Patient Age:    87 years       BP:           103/53 mmHg Patient Gender: F               HR:           84 bpm. Exam Location:  Inpatient Procedure: 2D Echo, Cardiac Doppler and Color Doppler Indications:    Cardiomyopathy-Unspecified I42.9  History:        Patient has prior history of Echocardiogram examinations, most                 recent 02/23/2017. CAD and Previous Myocardial Infarction, TIA,                 Arrythmias:Atrial Flutter and Atrial Fibrillation; Risk                 Factors:Hypertension and Dyslipidemia. First degree heart block                 (From Hx), Heart palpitations (From Hx), S/P  coronary artery                 stent placement (From Hx).  Sonographer:    Alvino Chapel RCS Referring Phys: [id number] Deleon Passe CHIRAG [place]delrahman IMPRESSIONS  1. Left ventricular ejection fraction, by estimation, is 60 to 65%. The left ventricle has normal function. The left ventricle has no regional wall motion abnormalities. There is mild concentric left ventricular hypertrophy. Left ventricular diastolic function could not be evaluated. There is the interventricular septum is flattened in systole and diastole, consistent with right ventricular pressure and volume overload.  2. Right ventricular systolic function is normal. The right ventricular size is mildly enlarged. There is mildly elevated pulmonary artery systolic pressure. The estimated right ventricular systolic pressure is 57.3 mmHg.  3. Left atrial size was moderately dilated.  4. Right atrial size was severely dilated.  5. The mitral valve is normal in structure. Moderate mitral valve regurgitation. No evidence of mitral stenosis. Severe mitral annular calcification.  6. Tricuspid valve regurgitation is severe.  7. The aortic valve is normal in structure. There is mild calcification of the aortic valve. There is mild thickening of the aortic valve. Aortic valve regurgitation is mild to moderate. Mild to moderate aortic valve sclerosis/calcification is present, without any evidence of aortic stenosis.  8. The inferior vena cava is dilated in size with <50% respiratory variability, suggesting right atrial pressure of 15 mmHg.  9. Evidence of atrial level shunting detected by color flow Doppler. There is a small secundum atrial septal defect with predominantly left to right shunting across the atrial septum. FINDINGS  Left Ventricle: Left ventricular ejection fraction, by estimation, is 60 to 65%. The left ventricle has normal function. The left ventricle has no regional wall motion abnormalities. The left ventricular internal cavity size was normal in size. There  is  mild concentric left ventricular hypertrophy. The interventricular septum is flattened in systole and diastole, consistent with right ventricular pressure and volume overload. Left ventricular diastolic function could not be evaluated due to atrial fibrillation. Left ventricular diastolic function could not be evaluated. Right Ventricle: The right ventricular size is mildly enlarged. No increase in right ventricular wall thickness. Right ventricular systolic function is normal. There is mildly elevated pulmonary artery systolic pressure. The tricuspid regurgitant velocity is 2.60 m/s, and with an assumed right atrial pressure of 15 mmHg, the estimated right ventricular systolic pressure is 22.0 mmHg. Left Atrium: Left atrial size was moderately dilated. Right Atrium: Right atrial size was severely dilated. Pericardium: There is no evidence of pericardial effusion. Mitral Valve: The mitral valve is normal in structure. There is mild thickening of the mitral valve leaflet(s). There is mild calcification of the mitral valve leaflet(s). Severe mitral annular calcification. Moderate mitral valve regurgitation, with posteriorly-directed jet. No evidence of mitral valve stenosis. Tricuspid Valve: The tricuspid valve is normal in structure. Tricuspid valve regurgitation is severe. No evidence of tricuspid stenosis. Aortic Valve: The aortic valve is normal in structure. There is mild calcification of the aortic valve. There is mild thickening of the aortic valve. Aortic valve regurgitation is mild to moderate. Aortic regurgitation PHT measures 434 msec. Mild to moderate aortic valve sclerosis/calcification is present, without any evidence of aortic stenosis. Pulmonic Valve: The pulmonic valve was normal in structure. Pulmonic valve regurgitation is not visualized. No evidence of pulmonic stenosis. Aorta: The aortic root is normal in size and structure. Venous: The inferior vena cava is dilated in size with less than 50%  respiratory variability, suggesting right atrial pressure of 15 mmHg. IAS/Shunts: Evidence of atrial level shunting detected by color flow Doppler. There is a small secundum atrial septal  defect with predominantly left to right shunting across the atrial septum.  LEFT VENTRICLE PLAX 2D LVIDd:         3.40 cm LVIDs:         2.20 cm LV PW:         0.90 cm LV IVS:        1.00 cm LVOT diam:     1.60 cm LV SV:         28 LV SV Index:   19 LVOT Area:     2.01 cm  RIGHT VENTRICLE TAPSE (M-mode): 2.3 cm LEFT ATRIUM             Index       RIGHT ATRIUM           Index LA diam:        3.90 cm 2.67 cm/m  RA Area:     25.50 cm LA Vol (A2C):   98.2 ml 67.28 ml/m RA Volume:   88.60 ml  60.70 ml/m LA Vol (A4C):   83.9 ml 57.48 ml/m LA Biplane Vol: 96.0 ml 65.77 ml/m  AORTIC VALVE LVOT Vmax:   63.50 cm/s LVOT Vmean:  45.000 cm/s LVOT VTI:    0.137 m AI PHT:      434 msec  AORTA Ao Root diam: 3.20 cm MITRAL VALVE                TRICUSPID VALVE MV Area (PHT): 5.54 cm     TR Peak grad:   27.0 mmHg MV Decel Time: 137 msec     TR Vmax:        260.00 cm/s MV E velocity: 155.00 cm/s                             SHUNTS                             Systemic VTI:  0.14 m                             Systemic Diam: 1.60 cm Ena Dawley MD Electronically signed by Ena Dawley MD Signature Date/Time: 05/01/2020/11:59:17 PM    Final    US Abdomen Limited RUQ (LIVER/GB)  Result Date: 04/30/2020 CLINICAL DATA:  87 year old female with elevated LFTs. EXAM: ULTRASOUND ABDOMEN LIMITED RIGHT UPPER QUADRANT COMPARISON:  CT abdomen pelvis dated 11/06/2011. FINDINGS: Gallbladder: The gallbladder is not identified with certainty and may be contracted. Common bile duct: Diameter: 4 mm Liver: Morphologic changes of cirrhosis. Multiple liver cysts measure up to 3 cm. The main portal vein is patent with somewhat bidirectional flow suggestive of portal hypertension. Other: Partially visualized small ascites as well as right pleural effusion.  IMPRESSION: 1. Nonvisualization of the gallbladder. 2. Cirrhosis with evidence of portal hypertension and ascites. 3. Right pleural effusion. Electronically Signed   By: Anner Crete M.D.   On: 04/30/2020 23:47      The results of significant diagnostics from this hospitalization (including imaging, microbiology, ancillary and laboratory) are listed below for reference.     Microbiology: Recent Results (from the past 240 hour(s))  Resp Panel by RT-PCR (Flu A&B, Covid) Nasopharyngeal Swab     Status: None   Collection Time: 04/30/20  6:54 PM   Specimen: Nasopharyngeal Swab; Nasopharyngeal(NP) swabs in vial transport medium  Result Value Ref Range  Status   SARS Coronavirus 2 by RT PCR NEGATIVE NEGATIVE Final    Comment: (NOTE) SARS-CoV-2 target nucleic acids are NOT DETECTED.  The SARS-CoV-2 RNA is generally detectable in upper respiratory specimens during the acute phase of infection. The lowest concentration of SARS-CoV-2 viral copies this assay can detect is 138 copies/mL. A negative result does not preclude SARS-Cov-2 infection and should not be used as the sole basis for treatment or other patient management decisions. A negative result may occur with  improper specimen collection/handling, submission of specimen other than nasopharyngeal swab, presence of viral mutation(s) within the areas targeted by this assay, and inadequate number of viral copies(<138 copies/mL). A negative result must be combined with clinical observations, patient history, and epidemiological information. The expected result is Negative.  Fact Sheet for Patients:  EntrepreneurPulse.com.au  Fact Sheet for Healthcare Providers:  IncredibleEmployment.be  This test is no t yet approved or cleared by the [place] FDA and  has been authorized for detection and/or diagnosis of SARS-CoV-2 by FDA under an Emergency Use Authorization (EUA). This EUA will remain  in  effect (meaning this test can be used) for the duration of the COVID-19 declaration under Section 564(b)(1) of the Act, 21 U.S.C.section 360bbb-3(b)(1), unless the authorization is terminated  or revoked sooner.       Influenza A by PCR NEGATIVE NEGATIVE Final   Influenza B by PCR NEGATIVE NEGATIVE Final    Comment: (NOTE) The Xpert Xpress SARS-CoV-2/FLU/RSV plus assay is intended as an aid in the diagnosis of influenza from Nasopharyngeal swab specimens and should not be used as a sole basis for treatment. Nasal washings and aspirates are unacceptable for Xpert Xpress SARS-CoV-2/FLU/RSV testing.  Fact Sheet for Patients: EntrepreneurPulse.com.au  Fact Sheet for Healthcare Providers: IncredibleEmployment.be  This test is not yet approved or cleared by the [place] FDA and has been authorized for detection and/or diagnosis of SARS-CoV-2 by FDA under an Emergency Use Authorization (EUA). This EUA will remain in effect (meaning this test can be used) for the duration of the COVID-19 declaration under Section 564(b)(1) of the Act, 21 U.S.C. section 360bbb-3(b)(1), unless the authorization is terminated or revoked.  Performed at [facility], [place] [address] [place],  [address]      Labs: BNP (last 3 results) Recent Labs    04/30/20 1932  BNP 601.0*   Basic Metabolic Panel: Recent Labs  Lab 04/30/20 1822 05/01/20 0458 05/02/20 0152 05/03/20 0204 05/04/20 0152  NA 133* 136 135 138 135  K 4.1 4.2 3.8 3.9 3.9  CL 104 103 102 104 99  CO2 20* 25 24 27 29   GLUCOSE 91 95 88 99 98  BUN 33* 32* 28* 26* 25*  CREATININE 1.06* 1.02* 0.95 0.86 1.07*  CALCIUM 8.9 8.7* 8.8* 9.3 9.5  MG 2.1 2.0 1.8 2.2 2.1   Liver Function Tests: Recent Labs  Lab 04/30/20 1822 05/01/20 1430 05/02/20 0152 05/03/20 0204 05/04/20 0152  AST 184* 149* 89* 50* 32  ALT 131* 167* 122* 91* 71*  ALKPHOS 164* 175* 139* 132* 129*  BILITOT 0.8  0.7 0.8 0.6 0.9  PROT 7.1 7.1 5.9* 6.3* 6.5  ALBUMIN 3.5 3.3* 2.9* 3.0* 3.1*   No results for input(s): LIPASE, AMYLASE in the last 168 hours. No results for input(s): AMMONIA in the last 168 hours. CBC: Recent Labs  Lab 04/30/20 1822 05/01/20 0458 05/02/20 0152 05/03/20 0204 05/04/20 0152  WBC 6.4 6.4 6.2 6.7 7.4  NEUTROABS 3.5  --   --   --   --  HGB 12.8 11.3* 10.9* 11.9* 11.7*  HCT 40.6 35.3* 32.5* 35.5* 36.0  MCV 100.5* 97.5 95.3 96.2 96.8  PLT 205 228 226 240 244   Cardiac Enzymes: No results for input(s): CKTOTAL, CKMB, CKMBINDEX, TROPONINI in the last 168 hours. BNP: Invalid input(s): POCBNP CBG: No results for input(s): GLUCAP in the last 168 hours. D-Dimer No results for input(s): DDIMER in the last 72 hours. Hgb A1c No results for input(s): HGBA1C in the last 72 hours. Lipid Profile No results for input(s): CHOL, HDL, LDLCALC, TRIG, CHOLHDL, LDLDIRECT in the last 72 hours. Thyroid function studies No results for input(s): TSH, T4TOTAL, T3FREE, THYROIDAB in the last 72 hours.  Invalid input(s): FREET3 Anemia work up No results for input(s): VITAMINB12, FOLATE, FERRITIN, TIBC, IRON, RETICCTPCT in the last 72 hours. Urinalysis    Component Value Date/Time   COLORURINE YELLOW 04/13/2020 1236   APPEARANCEUR CLEAR 04/13/2020 1236   LABSPEC 1.010 04/13/2020 1236   PHURINE 5.0 04/13/2020 1236   GLUCOSEU NEGATIVE 04/13/2020 1236   HGBUR NEGATIVE 04/13/2020 1236   BILIRUBINUR NEGATIVE 04/13/2020 1236   KETONESUR NEGATIVE 04/13/2020 1236   PROTEINUR NEGATIVE 04/13/2020 1236   UROBILINOGEN 0.2 10/01/2014 1628   NITRITE NEGATIVE 04/13/2020 1236   LEUKOCYTESUR NEGATIVE 04/13/2020 1236   Sepsis Labs Invalid input(s): PROCALCITONIN,  WBC,  LACTICIDVEN Microbiology Recent Results (from the past 240 hour(s))  Resp Panel by RT-PCR (Flu A&B, Covid) Nasopharyngeal Swab     Status: None   Collection Time: 04/30/20  6:54 PM   Specimen: Nasopharyngeal Swab;  Nasopharyngeal(NP) swabs in vial transport medium  Result Value Ref Range Status   SARS Coronavirus 2 by RT PCR NEGATIVE NEGATIVE Final    Comment: (NOTE) SARS-CoV-2 target nucleic acids are NOT DETECTED.  The SARS-CoV-2 RNA is generally detectable in upper respiratory specimens during the acute phase of infection. The lowest concentration of SARS-CoV-2 viral copies this assay can detect is 138 copies/mL. A negative result does not preclude SARS-Cov-2 infection and should not be used as the sole basis for treatment or other patient management decisions. A negative result may occur with  improper specimen collection/handling, submission of specimen other than nasopharyngeal swab, presence of viral mutation(s) within the areas targeted by this assay, and inadequate number of viral copies(<138 copies/mL). A negative result must be combined with clinical observations, patient history, and epidemiological information. The expected result is Negative.  Fact Sheet for Patients:  EntrepreneurPulse.com.au  Fact Sheet for Healthcare Providers:  IncredibleEmployment.be  This test is no t yet approved or cleared by the [place] FDA and  has been authorized for detection and/or diagnosis of SARS-CoV-2 by FDA under an Emergency Use Authorization (EUA). This EUA will remain  in effect (meaning this test can be used) for the duration of the COVID-19 declaration under Section 564(b)(1) of the Act, 21 U.S.C.section 360bbb-3(b)(1), unless the authorization is terminated  or revoked sooner.       Influenza A by PCR NEGATIVE NEGATIVE Final   Influenza B by PCR NEGATIVE NEGATIVE Final    Comment: (NOTE) The Xpert Xpress SARS-CoV-2/FLU/RSV plus assay is intended as an aid in the diagnosis of influenza from Nasopharyngeal swab specimens and should not be used as a sole basis for treatment. Nasal washings and aspirates are unacceptable for Xpert Xpress  SARS-CoV-2/FLU/RSV testing.  Fact Sheet for Patients: EntrepreneurPulse.com.au  Fact Sheet for Healthcare Providers: IncredibleEmployment.be  This test is not yet approved or cleared by the [place] FDA and has been authorized for detection and/or  diagnosis of SARS-CoV-2 by FDA under an Emergency Use Authorization (EUA). This EUA will remain in effect (meaning this test can be used) for the duration of the COVID-19 declaration under Section 564(b)(1) of the Act, 21 U.S.C. section 360bbb-3(b)(1), unless the authorization is terminated or revoked.  Performed at [facility], [place] [address] [place], [place] [address]      Time coordinating discharge:  I have spent 35 minutes face to face with the patient and on the ward discussing the patients care, assessment, plan and disposition with other care givers. >50% of the time was devoted counseling the patient about the risks and benefits of treatment/Discharge disposition and coordinating care.   SIGNED:   Damita Lack, MD  [facility] 05/04/2020, 11:47 AM   If 7PM-7AM, please contact night-coverage

## 2020-05-06 ENCOUNTER — Telehealth: Payer: Self-pay | Admitting: Cardiovascular Disease

## 2020-05-06 NOTE — Telephone Encounter (Signed)
Herbert Deaner with [facility] did home visit with patient once she was discharge from the hospital. Constipated going into the hospital but stated it is now loose. Reported chocking weekly, he thinks patient would benfit from a speech referral home health aid 1 time week for 3 weeks. Also physical therapy 1 week 1; 2 week 4, and 1 week 4. Then patient doesn't have and hasnt taken   beta carotene w/minerals (OCUVITE) tablet   And was given omeprazole (PRILOSEC) 20 MG capsule in the hospital.

## 2020-05-10 ENCOUNTER — Ambulatory Visit (HOSPITAL_COMMUNITY): Payer: Medicare PPO | Admitting: Physician Assistant

## 2020-05-10 NOTE — Progress Notes (Incomplete)
Primary Care Physician: Leonard Downing, MD Primary Cardiologist: Dr Angelena Form Primary Electrophysiologist: none Referring Physician: Dr Myrla Halsted is a 87 y.o. female with a history of CAD, aortic valve insufficiency, mitral regurgitation, prior TIA, rectal cancer and atrial flutter who presents for follow up in the [facility].  The patient was initially diagnosed with atrial flutter in September 2017. Anti-coagulation was not started given advanced age and fall risk. She has been on ASA and a beta blocker. She was admitted with a probable TIA in January 2018. She has a CHADS2VASC score of 6. Patient was seen by Dr Angelena Form 01/2020 and her amiodarone was stopped at that time. She was seen at the ED 04/13/20 with rapid rates. She was given a fluid bolus and her heart rates slowed. Her Toprol was increased by DOD over the phone on 04/26/20 but she never started the higher dose.   On follow up today, patient was hospitalized 3/18-3/22/22 with afib and acute diastolic CHF. Of note, she was also diagnosed with cirrhosis with evidence of portal HTN and ascites. Patient and family elected to not pursue biopsy or workup at this time. ***  Today, she denies symptoms of ***palpitations, chest pain, orthopnea, PND, lower extremity edema, dizziness, presyncope, syncope, snoring, daytime somnolence, bleeding, or neurologic sequela. The patient is tolerating medications without difficulties and is otherwise without complaint today.    Atrial Fibrillation Risk Factors:  she does not have symptoms or diagnosis of sleep apnea. she does not have a history of rheumatic fever. she does not have a history of alcohol use.   she has a BMI of There is no height or weight on file to calculate BMI.. There were no vitals filed for this visit.  Family History  Problem Relation Age of Onset  . Kidney disease Mother   . Heart disease Father   . Cancer Brother       Atrial Fibrillation Management history:  Previous antiarrhythmic drugs: amiodarone  Previous cardioversions: none Previous ablations: none CHADS2VASC score: 6 Anticoagulation history: none   Past Medical History:  Diagnosis Date  . Arthritis    FINGERS   . CAD (coronary artery disease) CARDIOLOGIST-- DR Angelena Form   a. s/p INF STEMI 7/12: tx with BMS to RCA;  b. cath 08/26/10: pLAD 30%, mLAD 50%, D1 40%, pCFX 95%, mRCA occluded;   c. staged PCI of pCFX with BMS;   d. echo 7/12:   EF 60-65%, mild RAE, mild to moderate AI, mild MR, moderate TR, RVE, PASP 47  . Complication of anesthesia PT STATES "MADE HER FEEL CRAZY"  . Degeneration of eye    left eye cornea  . Dyspnea   . First degree heart block   . Heart palpitations PAC'S AND SVT RUN'S  PER CARDIOLOGIST NOTE  . History of ST elevation myocardial infarction (STEMI) 08-26-2010-- INFERIOR WALL   S/P PCI  BMS IN RCA AND PROX. CX  . Hyperlipidemia   . Hypertension   . Impaired hearing BILATERAL HEARING AIDS  . Osteopenia   . PAF (paroxysmal atrial fibrillation) ([place])   . PONV (postoperative nausea and vomiting)   . Pulmonary nodules BENIGN  PER CT  10-12-2010  . Rectal Cancer 08/2010   adenocarcinoma   S/P PARTIAL PROCTECTOMY (NO CHEMO OR RADIATION)  . 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 S/P coronary artery stent placement 08/2010  X2  BM   Past Surgical History:  Procedure Laterality Date  . ABDOMINAL HYSTERECTOMY  1950's   AND APPENDECTOMY  . CATARACT EXTRACTION W/ INTRAOCULAR LENS  IMPLANT, BILATERAL    . CORONARY ANGIOPLASTY WITH STENT PLACEMENT  08-26-2010  DR [place]   PCI, BM STENT IN RCA  . CORONARY ANGIOPLASTY WITH STENT PLACEMENT  08-29-2010  DR [place]   PCI, BM STENT IN PROXIMAL CIRCUMFLEX  . EXCISION BENIGN CYST RIGHT BREAST    . FISTULA PLUG N/A 06/21/2012   Procedure: insertion of FISTULA PLUG;  Surgeon: Leighton Ruff, MD;  [facility];  Service: General;  Laterality: N/A;  . FLEXIBLE SIGMOIDOSCOPY N/A 04/02/2012   Procedure: FLEXIBLE SIGMOIDOSCOPY;  Surgeon: Leighton Ruff, MD;  Location: [facility];  Service: Endoscopy;  Laterality: N/A;  . KNEE SURGERY Right 1996  . LAPROSCOPY LYSIS ADHESIONS/ DRAINAGE OF PELVIC ABSCESS/ DIVERTING LOOP SIGMOID COLECTOMY  11-22-2010   POST OP RECTOVAGINAL FISTULA  . PARTIAL PROCTECTOMY BY TEM  11-17-2010   RECTAL CANCER  . RELEASE LEFT CARPAL TUNNEL/ OSTEOTOMY LEFT DISTAL RADIUS  11-10-2009  . STAPEDECTOMY  1970'S  . TONSILLECTOMY  CHILD  . TOTAL HIP ARTHROPLASTY Right 1992  . TRANSTHORACIC ECHOCARDIOGRAM  10-10-2011   NORMAL LV SIZE WITH MILD FOCAL BASAL SEPTAL HYPERTROPHY/ EF 55-60%/ NORMAL RV SIZE AND LVSF/ BIATRIAL ENLARGEMENT/ MILD TO MODERATE AI  &  TR  . TYMPANOPLASTY Left 12-23-2009    Current Outpatient Medications  Medication Sig Dispense Refill  . acetaminophen (TYLENOL) 500 MG tablet Take 2 tablets (1,000 mg total) by mouth every 6 (six) hours as needed for mild pain. 30 tablet 0  . amiodarone (PACERONE) 200 MG tablet Take 0.5 tablets (100 mg total) by mouth daily. 45 tablet 1  . aspirin EC 81 MG tablet Take 81 mg by mouth daily.    . beta carotene w/minerals (OCUVITE) tablet Take 1 tablet by mouth daily.    Marland Kitchen BIOTIN PO Take 1 tablet by mouth daily.     . Cholecalciferol (VITAMIN D) 50 MCG (2000 UT) tablet Take 2,000 Units by mouth daily.    . furosemide (LASIX) 40 MG tablet Take 40 mg by mouth daily. Per patient taking 1/2 tablet    . melatonin 5 MG TABS Take 5 mg by mouth at bedtime.    . metoprolol succinate (TOPROL XL) 25 MG 24 hr tablet Take 0.5 tablets (12.5 mg total) by mouth at bedtime. 60 tablet 5  . nitroGLYCERIN (NITROSTAT) 0.4 MG SL tablet Place 0.4 mg under the tongue every 5 (five) minutes as needed for chest pain.    Marland Kitchen omeprazole (PRILOSEC) 20 MG capsule Take 20 mg by mouth daily.    Vladimir Faster  Glycol-Propyl Glycol (SYSTANE) 0.4-0.3 % GEL ophthalmic gel Place 1 drop into both eyes at bedtime.     . polyethylene glycol (MIRALAX / GLYCOLAX) 17 g packet Take 17 g by mouth daily as needed for moderate constipation or severe constipation. 14 each 0  . senna-docusate (SENOKOT-S) 8.6-50 MG tablet Take 1 tablet by mouth 2 (two) times daily as needed for mild constipation. 60 tablet 0  . sertraline (ZOLOFT) 25 MG tablet Take 25 mg by mouth at bedtime.     No current facility-administered medications for this visit.    Allergies  Allergen Reactions  . Effexor [Venlafaxine Hydrochloride] Other (See Comments)    Reaction not recalled by the patient  . Penicillins Shortness Of Breath    Has patient had a PCN  reaction causing immediate rash, facial/tongue/throat swelling, SOB or lightheadedness with hypotension: Yes Has patient had a PCN reaction causing severe rash involving mucus membranes or skin necrosis: No Has patient had a PCN reaction that required hospitalization: No Has patient had a PCN reaction occurring within the last 10 years: No If all of the above answers are "NO", then may proceed with Cephalosporin use.   . Sulfa Antibiotics Nausea Only  . Dicyclomine Other (See Comments)    Caused confusion  . Lactose Intolerance (Gi) Diarrhea  . Hydrocodone Nausea Only    Social History   Socioeconomic History  . Marital status: Widowed    Spouse name: Not on file  . Number of children: Not on file  . Years of education: Not on file  . Highest education level: Not on file  Occupational History  . Not on file  Tobacco Use  . Smoking status: Never Smoker  . Smokeless tobacco: Never Used  Substance and Sexual Activity  . Alcohol use: No  . Drug use: No  . Sexual activity: Not on file  Other Topics Concern  . Not on file  Social History Narrative  . Not on file   Social Determinants of Health   Financial Resource Strain: Not on file  Food Insecurity: Not on file   Transportation Needs: Not on file  Physical Activity: Not on file  Stress: Not on file  Social Connections: Not on file  Intimate Partner Violence: Not on file     ROS- All systems are reviewed and negative except as per the HPI above.  Physical Exam: There were no vitals filed for this visit.  GEN- The patient is a well appearing *** {Desc; female/female:[id number]}, alert and oriented x 3 today.   HEENT-head normocephalic, atraumatic, sclera clear, conjunctiva pink, hearing intact, trachea midline. Lungs- Clear to ausculation bilaterally, normal work of breathing Heart- ***Regular rate and rhythm, no murmurs, rubs or gallops  GI- soft, NT, ND, + BS Extremities- no clubbing, cyanosis, or edema MS- no significant deformity or atrophy Skin- no rash or lesion Psych- euthymic mood, full affect Neuro- strength and sensation are intact   Wt Readings from Last 3 Encounters:  05/04/20 42.1 kg  04/27/20 46.8 kg  04/13/20 44.5 kg    EKG today demonstrates  ***  Echo 05/01/20 demonstrated  1. Left ventricular ejection fraction, by estimation, is 60 to 65%. The  left ventricle has normal function. The left ventricle has no regional  wall motion abnormalities. There is mild concentric left ventricular  hypertrophy. Left ventricular diastolic  function could not be evaluated. There is the interventricular septum is  flattened in systole and diastole, consistent with right ventricular  pressure and volume overload.  2. Right ventricular systolic function is normal. The right ventricular  size is mildly enlarged. There is mildly elevated pulmonary artery  systolic pressure. The estimated right ventricular systolic pressure is  06.2 mmHg.  3. Left atrial size was moderately dilated.  4. Right atrial size was severely dilated.  5. The mitral valve is normal in structure. Moderate mitral valve  regurgitation. No evidence of mitral stenosis. Severe mitral annular  calcification.  6.  Tricuspid valve regurgitation is severe.  7. The aortic valve is normal in structure. There is mild calcification  of the aortic valve. There is mild thickening of the aortic valve. Aortic  valve regurgitation is mild to moderate. Mild to moderate aortic valve  sclerosis/calcification is present,  without any evidence of aortic stenosis.  8.  The inferior vena cava is dilated in size with <50% respiratory  variability, suggesting right atrial pressure of 15 mmHg.  9. Evidence of atrial level shunting detected by color flow Doppler.  There is a small secundum atrial septal defect with predominantly left to  right shunting across the atrial septum.   Epic records are reviewed at length today  CHA2DS2-VASc Score = 6  The patient's score is based upon: CHF History: No HTN History: No Diabetes History: No Stroke History: Yes Vascular Disease History: Yes Age Score: 2 Gender Score: 1      ASSESSMENT AND PLAN: 1. Atrial flutter The patient's CHA2DS2-VASc score is 6, indicating a 9.7% annual risk of stroke.   ***inc BB? We discussed therapeutic options. Patient reports nightmares on higher doses of BB. Will resume amiodarone at low dose 100 mg daily for symptom relief.  Continue Toprol 12.5 mg daily  2. Secondary Hypercoagulable State (ICD10:  D68.69) The patient is at significant risk for stroke/thromboembolism based upon her CHA2DS2-VASc Score of 6.  Patient is not on anticoagulation given age and high fall risk per Dr Angelena Form .   3. CAD No anginal symptoms. Not felt to be a LHC candidate.  ***  4. HTN Stable, no changes today. ***  5. Chronic diastolic CHF ***   Follow up ***   Adline Peals PA-C [facility] [place], [place] [address] [phone] 05/10/2020 9:06 AM

## 2020-05-24 ENCOUNTER — Emergency Department (HOSPITAL_COMMUNITY): Payer: Medicare PPO

## 2020-05-24 ENCOUNTER — Other Ambulatory Visit: Payer: Self-pay

## 2020-05-24 ENCOUNTER — Emergency Department (HOSPITAL_COMMUNITY)
Admission: EM | Admit: 2020-05-24 | Discharge: 2020-05-24 | Disposition: A | Discharge status: Home / Self Care | Payer: Medicare PPO | Attending: Emergency Medicine | Admitting: Emergency Medicine

## 2020-05-24 DIAGNOSIS — W19XXXA Unspecified fall, initial encounter: Secondary | ICD-10-CM

## 2020-05-24 DIAGNOSIS — G319 Degenerative disease of nervous system, unspecified: Secondary | ICD-10-CM | POA: Insufficient documentation

## 2020-05-24 DIAGNOSIS — Z955 Presence of coronary angioplasty implant and graft: Secondary | ICD-10-CM | POA: Diagnosis not present

## 2020-05-24 DIAGNOSIS — Z85040 Personal history of malignant carcinoid tumor of rectum: Secondary | ICD-10-CM | POA: Insufficient documentation

## 2020-05-24 DIAGNOSIS — W1811XA Fall from or off toilet without subsequent striking against object, initial encounter: Secondary | ICD-10-CM | POA: Insufficient documentation

## 2020-05-24 DIAGNOSIS — Z7982 Long term (current) use of aspirin: Secondary | ICD-10-CM | POA: Diagnosis not present

## 2020-05-24 DIAGNOSIS — Z96641 Presence of right artificial hip joint: Secondary | ICD-10-CM | POA: Insufficient documentation

## 2020-05-24 DIAGNOSIS — Z79899 Other long term (current) drug therapy: Secondary | ICD-10-CM | POA: Insufficient documentation

## 2020-05-24 DIAGNOSIS — S22040A Wedge compression fracture of fourth thoracic vertebra, initial encounter for closed fracture: Secondary | ICD-10-CM

## 2020-05-24 DIAGNOSIS — I2510 Atherosclerotic heart disease of native coronary artery without angina pectoris: Secondary | ICD-10-CM | POA: Diagnosis not present

## 2020-05-24 DIAGNOSIS — S0990XA Unspecified injury of head, initial encounter: Secondary | ICD-10-CM | POA: Insufficient documentation

## 2020-05-24 DIAGNOSIS — Y92002 Bathroom of unspecified non-institutional (private) residence single-family (private) house as the place of occurrence of the external cause: Secondary | ICD-10-CM | POA: Diagnosis not present

## 2020-05-24 DIAGNOSIS — S299XXA Unspecified injury of thorax, initial encounter: Secondary | ICD-10-CM | POA: Diagnosis present

## 2020-05-24 DIAGNOSIS — M25551 Pain in right hip: Secondary | ICD-10-CM

## 2020-05-24 DIAGNOSIS — S22049A Unspecified fracture of fourth thoracic vertebra, initial encounter for closed fracture: Secondary | ICD-10-CM | POA: Insufficient documentation

## 2020-05-24 DIAGNOSIS — I10 Essential (primary) hypertension: Secondary | ICD-10-CM | POA: Diagnosis not present

## 2020-05-24 DIAGNOSIS — M542 Cervicalgia: Secondary | ICD-10-CM

## 2020-05-24 MED ORDER — ACETAMINOPHEN 500 MG PO TABS
1000.0000 mg | ORAL_TABLET | Freq: Once | ORAL | Status: AC
  Administered 2020-05-24: 1000 mg via ORAL
  Filled 2020-05-24: qty 2

## 2020-05-24 MED ORDER — TRAMADOL HCL 50 MG PO TABS
50.0000 mg | ORAL_TABLET | Freq: Once | ORAL | Status: AC
  Administered 2020-05-24: 50 mg via ORAL
  Filled 2020-05-24: qty 1

## 2020-05-24 NOTE — ED Notes (Signed)
Pts medication crushed and placed in applesauce.

## 2020-05-24 NOTE — ED Notes (Signed)
Patient transported to Xray via stretcher in stable condition. 

## 2020-05-24 NOTE — ED Notes (Signed)
Pt transported to radiology.

## 2020-05-24 NOTE — Discharge Instructions (Addendum)
The CT scan of your head and neck was unremarkable.  There does appear to be a small compression fracture of your thoracic vertebra #4.  I given you the information for a spine surgeon to follow-up with however generally small compression fracture such as this did not benefit from surgery.  Please use Tylenol 1000 mg every 6 hours as needed for pain.  Please follow-up with your primary care doctor as well.  Your x-ray of your pelvis showed improvement in the pubic rami fracture that you experienced in March 2021.  There are no new fractures.

## 2020-05-24 NOTE — ED Provider Notes (Signed)
[facility] EMERGENCY DEPARTMENT Provider Note   CSN: [id number] Arrival date & time: 05/24/20  9163     History Chief Complaint  Patient presents with  . Fall    NIKHITA MENTZEL is a 87 y.o. female.  HPI Patient is a 87 year old female presented today with no pain she presents after she had a fall earlier today.  She states that she was attempting to sit on the elevated seat of her toilet which is set up this way so that she does not have to squat and stand back up.  She states she gets around well at her house with a walker.  She states that she fell forward and struck her head on the ground which was linoleum tile.  She states that she did not lose her consciousness or have any nausea, vomiting, shortness of breath.  She denies any symptoms prior to her fall such as chest pain, shortness of breath, lightheadedness, dizziness, abdominal pain.  She states that she feels well presently except for some mild neck pain  Denies any other symptoms.  No other pain.  She is taken no medications prior to arrival.     Past Medical History:  Diagnosis Date  . Arthritis    FINGERS   . CAD (coronary artery disease) CARDIOLOGIST-- DR Angelena Form   a. s/p INF STEMI 7/12: tx with BMS to RCA;  b. cath 08/26/10: pLAD 30%, mLAD 50%, D1 40%, pCFX 95%, mRCA occluded;   c. staged PCI of pCFX with BMS;   d. echo 7/12:   EF 60-65%, mild RAE, mild to moderate AI, mild MR, moderate TR, RVE, PASP 47  . Complication of anesthesia PT STATES "MADE HER FEEL CRAZY"  . Degeneration of eye    left eye cornea  . Dyspnea   . First degree heart block   . Heart palpitations PAC'S AND SVT RUN'S  PER CARDIOLOGIST NOTE  . History of ST elevation myocardial infarction (STEMI) 08-26-2010-- INFERIOR WALL   S/P PCI  BMS IN RCA AND PROX. CX  . Hyperlipidemia   . Hypertension   . Impaired hearing BILATERAL HEARING AIDS  . Osteopenia   . PAF (paroxysmal atrial fibrillation) ([place])   . PONV (postoperative  nausea and vomiting)   . Pulmonary nodules BENIGN  PER CT  10-12-2010  . Rectal Cancer 08/2010   adenocarcinoma   S/P PARTIAL PROCTECTOMY (NO CHEMO OR RADIATION)  . 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 S/P coronary artery stent placement 08/2010   X2  BM    Patient Active Problem List   Diagnosis Date Noted  . Atrial fibrillation with RVR ([place]) 05/03/2020  . Other cirrhosis of liver ([place]) 05/01/2020  . Paroxysmal atrial fibrillation with rapid ventricular response ([place]) 04/30/2020  . Acute on chronic diastolic CHF (congestive heart failure) ([place]) 04/30/2020  . Elevated LFTs 04/30/2020  . AKI (acute kidney injury) ([place]) 04/30/2020  . Atrial flutter ([place]) 04/27/2020  . Secondary hypercoagulable state ([place]) 04/27/2020  . Closed fracture of multiple pubic rami, left, initial encounter ([place]) 09/06/2019  . Slurred speech 02/23/2017  . PAF (paroxysmal atrial fibrillation) ([place]) 02/23/2017  . Depression with anxiety 02/23/2017  . TIA (transient ischemic attack)   . Fracture, tibia and fibula 07/06/2015  . Tibia/fibula fracture 07/03/2015  . Lung nodule seen on imaging study 09/05/2013  . Exposure to TB 09/05/2013  . SOB (shortness of breath) 10/02/2011  .  Vascular skin changes 07/17/2011  . 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 Anorexia post-op 12/08/2010  . HTN (hypertension) 11/03/2010  . Dizziness 09/15/2010  . CAD (coronary artery disease)   . Hyperlipidemia   . Rectal cancer, pT2uN0(pNX) s/p TEM partial proctectomy 09/07/2010    Past Surgical History:  Procedure Laterality Date  . ABDOMINAL HYSTERECTOMY  1950's   AND APPENDECTOMY  . CATARACT EXTRACTION W/ INTRAOCULAR LENS  IMPLANT, BILATERAL    . CORONARY ANGIOPLASTY WITH STENT PLACEMENT  08-26-2010  DR [place]   PCI, BM STENT IN RCA  . CORONARY ANGIOPLASTY WITH STENT PLACEMENT  08-29-2010  DR [place]    PCI, BM STENT IN PROXIMAL CIRCUMFLEX  . EXCISION BENIGN CYST RIGHT BREAST    . FISTULA PLUG N/A 06/21/2012   Procedure: insertion of FISTULA PLUG;  Surgeon: Leighton Ruff, MD;  Location: [facility];  Service: General;  Laterality: N/A;  . FLEXIBLE SIGMOIDOSCOPY N/A 04/02/2012   Procedure: FLEXIBLE SIGMOIDOSCOPY;  Surgeon: Leighton Ruff, MD;  Location: [facility] ENDOS[facility];  Service: Endoscopy;  Laterality: N/A;  . KNEE SURGERY Right 1996  . LAPROSCOPY LYSIS ADHESIONS/ DRAINAGE OF PELVIC ABSCESS/ DIVERTING LOOP SIGMOID COLECTOMY  11-22-2010   POST OP RECTOVAGINAL FISTULA  . PARTIAL PROCTECTOMY BY TEM  11-17-2010   RECTAL CANCER  . RELEASE LEFT CARPAL TUNNEL/ OSTEOTOMY LEFT DISTAL RADIUS  11-10-2009  . STAPEDECTOMY  1970'S  . TONSILLECTOMY  CHILD  . TOTAL HIP ARTHROPLASTY Right 1992  . TRANSTHORACIC ECHOCARDIOGRAM  10-10-2011   NORMAL LV SIZE WITH MILD FOCAL BASAL SEPTAL HYPERTROPHY/ EF 55-60%/ NORMAL RV SIZE AND LVSF/ BIATRIAL ENLARGEMENT/ MILD TO MODERATE AI  &  TR  . TYMPANOPLASTY Left 12-23-2009     OB History   No obstetric history on file.     Family History  Problem Relation Age of Onset  . Kidney disease Mother   . Heart disease Father   . Cancer Brother     Social History   Tobacco Use  . Smoking status: Never Smoker  . Smokeless tobacco: Never Used  Substance Use Topics  . Alcohol use: No  . Drug use: No    Home Medications Prior to Admission medications   Medication Sig Start Date End Date Taking? Authorizing Provider  acetaminophen (TYLENOL) 500 MG tablet Take 2 tablets (1,000 mg total) by mouth every 6 (six) hours as needed for mild pain. 07/06/15   Rama, Venetia Maxon, MD  amiodarone (PACERONE) 200 MG tablet Take 0.5 tablets (100 mg total) by mouth daily. 04/27/20   Fenton, Clint R, PA  aspirin EC 81 MG tablet Take 81 mg by mouth daily.    [provider]  beta carotene w/minerals (OCUVITE) tablet Take 1 tablet by mouth daily.    [provider]  BIOTIN PO Take 1 tablet by mouth daily.     [provider]  Cholecalciferol (VITAMIN D) 50 MCG (2000 UT) tablet Take 2,000 Units by mouth daily.    [provider]  furosemide (LASIX) 40 MG tablet Take 40 mg by mouth daily. Per patient taking 1/2 tablet    [provider]  melatonin 5 MG TABS Take 5 mg by mouth at bedtime.    [provider]  metoprolol succinate (TOPROL XL) 25 MG 24 hr tablet Take 0.5 tablets (12.5 mg total) by mouth at bedtime. 04/27/20   Fenton, Clint R, PA  nitroGLYCERIN (NITROSTAT) 0.4 MG SL tablet Place 0.4 mg under the tongue every 5 (  five) minutes as needed for chest pain.    [provider]  omeprazole (PRILOSEC) 20 MG capsule Take 20 mg by mouth daily.    [provider]  Polyethyl Glycol-Propyl Glycol (SYSTANE) 0.4-0.3 % GEL ophthalmic gel Place 1 drop into both eyes at bedtime.     [provider]  polyethylene glycol (MIRALAX / GLYCOLAX) 17 g packet Take 17 g by mouth daily as needed for moderate constipation or severe constipation. 05/04/20   Amin, Ankit Chirag, MD  polyethylene glycol powder (GLYCOLAX/MIRALAX) 17 GM/SCOOP powder DISSOLVE 1 CAPFUL (17 G) IN WATER AND DRINK DAILY AS NEEDED FOR MODERATE CONSTIPATION OR SEVERE CONSTIPATION. 05/04/20 05/04/21  Amin, Jeanella Flattery, MD  senna-docusate (SENOKOT-S) 8.6-50 MG tablet Take 1 tablet by mouth 2 (two) times daily as needed for mild constipation. 05/04/20   Amin, Ankit Chirag, MD  senna-docusate (SENOKOT-S) 8.6-50 MG tablet TAKE 1 TABLET BY MOUTH TWO TIMES DAILY AS NEEDED FOR MILD CONSTIPATION. 05/04/20 05/04/21  Damita Lack, MD  sertraline (ZOLOFT) 25 MG tablet Take 25 mg by mouth at bedtime. 10/28/19   [provider]    Allergies    Effexor [venlafaxine hydrochloride], Penicillins, Sulfa antibiotics, Dicyclomine, Lactose intolerance (gi), and Hydrocodone  Review of Systems   Review of Systems  Constitutional: Negative for  chills and fever.  HENT: Negative for congestion.   Eyes: Negative for pain.  Respiratory: Negative for cough and shortness of breath.   Cardiovascular: Negative for chest pain and leg swelling.  Gastrointestinal: Negative for abdominal pain, diarrhea, nausea and vomiting.  Genitourinary: Negative for dysuria.  Musculoskeletal: Negative for myalgias.       Some neck pain  Skin: Negative for rash.  Neurological: Negative for dizziness and headaches.    Physical Exam Updated Vital Signs BP (!) 126/59   Pulse 72   Temp (!) 97.5 F (36.4 C) (Oral)   Resp 19   SpO2 95%   Physical Exam Vitals and nursing note reviewed.  Constitutional:      General: She is not in acute distress.    Comments: Pleasant comfortable appearing [age]-year-old female.  HENT:     Head: Normocephalic.     Comments: Small approximately 2 cm x 1 cm oval shaped abrasion to forehead    Nose: Nose normal.  Eyes:     General: No scleral icterus. Cardiovascular:     Rate and Rhythm: Normal rate and regular rhythm.     Pulses: Normal pulses.     Heart sounds: Normal heart sounds.  Pulmonary:     Effort: Pulmonary effort is normal. No respiratory distress.     Breath sounds: No wheezing.  Abdominal:     Palpations: Abdomen is soft.     Tenderness: There is no abdominal tenderness.  Musculoskeletal:     Cervical back: Normal range of motion.     Right lower leg: No edema.     Left lower leg: No edema.     Comments: No bony tenderness over joints or long bones of the upper and lower extremities.     No neck or back midline tenderness, step-off, deformity, or bruising. Able to turn head left and right 45 degrees without difficulty.  Full range of motion of upper and lower extremity joints shown after palpation was conducted; with 5/5 symmetrical strength in upper and lower extremities. No chest wall tenderness, no facial or cranial tenderness.   Patient has intact sensation grossly in lower and upper  extremities. Intact patellar and ankle  reflexes. Patient able to ambulate without difficulty.  Radial and DP pulses palpated BL.   Skin:    General: Skin is warm and dry.     Capillary Refill: Capillary refill takes less than 2 seconds.  Neurological:     Mental Status: She is alert. Mental status is at baseline.  Psychiatric:        Mood and Affect: Mood normal.        Behavior: Behavior normal.     ED Results / Procedures / Treatments   Labs (all labs ordered are listed, but only abnormal results are displayed) Labs Reviewed - No data to display  EKG None  Radiology DG Thoracic Spine 2 View  Result Date: 05/24/2020 CLINICAL DATA:  87 year old female status post fall 1 week ago with persistent pain. EXAM: THORACIC SPINE 2 VIEWS COMPARISON:  Two-view chest 07/19/2019 and earlier. FINDINGS: Osteopenia. Chronically exaggerated thoracic kyphosis. No upper thoracic compression fracture since last year, approximately the T4 level. Mild loss of height. Other thoracic vertebral height and alignment appears stable since last year. Stable visible thoracic visceral contours. Visible upper lumbar levels appear grossly stable. IMPRESSION: Mild T4 compression , new since last year and compatible with subacute compression fracture in this setting. No complicating features. Electronically Signed   By: Genevie Ann M.D.   On: 05/24/2020 10:31   DG Shoulder Right  Result Date: 05/24/2020 CLINICAL DATA:  Right shoulder pain following fall, initial encounter EXAM: RIGHT SHOULDER - 2+ VIEW COMPARISON:  None. FINDINGS: Degenerative changes of the acromioclavicular joint and glenohumeral joint are seen. Humeral head is high-riding which may be related to chronic rotator cuff injury. No acute fracture or dislocation is noted. The underlying bony thorax appears within normal limits. IMPRESSION: Degenerative changes as described with findings suggestive of prior rotator cuff injury. Electronically Signed   By: Inez Catalina M.D.   On: 05/24/2020 14:11   CT Head Wo Contrast  Result Date: 05/24/2020 CLINICAL DATA:  Fall EXAM: CT HEAD WITHOUT CONTRAST TECHNIQUE: Contiguous axial images were obtained from the base of the skull through the vertex without intravenous contrast. COMPARISON:  09/06/2019 FINDINGS: Brain: There is atrophy and chronic small vessel disease changes. No acute intracranial abnormality. Specifically, no hemorrhage, hydrocephalus, mass lesion, acute infarction, or significant intracranial injury. Vascular: No hyperdense vessel or unexpected calcification. Skull: No acute calvarial abnormality. Sinuses/Orbits: Visualized paranasal sinuses and mastoids clear. Orbital soft tissues unremarkable. Other: None IMPRESSION: Atrophy, chronic microvascular disease. No acute intracranial abnormality. Electronically Signed   By: Rolm Baptise M.D.   On: 05/24/2020 10:40   CT Cervical Spine Wo Contrast  Result Date: 05/24/2020 CLINICAL DATA:  Fall EXAM: CT CERVICAL SPINE WITHOUT CONTRAST TECHNIQUE: Multidetector CT imaging of the cervical spine was performed without intravenous contrast. Multiplanar CT image reconstructions were also generated. COMPARISON:  07/03/2015 FINDINGS: Alignment: No subluxation. Skull base and vertebrae: No acute fracture. No primary bone lesion or focal pathologic process. Soft tissues and spinal canal: No prevertebral fluid or swelling. No visible canal hematoma. Disc levels: Diffuse degenerative disc and facet disease, moderate to advanced. Partial fusion across the C3-4 and C4-5 disc spaces related to degenerative change. Upper chest: Biapical scarring. Other: None IMPRESSION: Degenerative disc and facet disease.  No acute bony abnormality. Electronically Signed   By: Rolm Baptise M.D.   On: 05/24/2020 10:44   DG Knee Complete 4 Views Right  Result Date: 05/24/2020 CLINICAL DATA:  Recent fall with right knee pain, initial encounter EXAM: RIGHT KNEE -  COMPLETE 4+ VIEW COMPARISON:   07/03/15 FINDINGS: Postsurgical changes are noted in the proximal tibia. Mild degenerative changes of the knee joint are seen. No acute fracture or dislocation is noted. No joint effusion is seen. IMPRESSION: Postsurgical and degenerative changes without acute abnormality. Electronically Signed   By: Inez Catalina M.D.   On: 05/24/2020 14:10   DG Hip Unilat W or Wo Pelvis 2-3 Views Right  Result Date: 05/24/2020 CLINICAL DATA:  Right hip pain following fall, initial encounter EXAM: DG HIP (WITH OR WITHOUT PELVIS) 2-3V RIGHT COMPARISON:  09/06/2019 FINDINGS: Right hip replacement is again identified. Prior fracture in the superior to inferior pubic rami on the left are again seen with partial healing. No new fracture or dislocation is identified. No soft tissue abnormality is seen. IMPRESSION: Postsurgical changes in the right hip. Fractures in the left pubic rami with some healing when compared with the prior exam. Electronically Signed   By: Inez Catalina M.D.   On: 05/24/2020 13:58    Procedures Procedures   Medications Ordered in ED Medications  acetaminophen (TYLENOL) tablet 1,000 mg (1,000 mg Oral Given 05/24/20 1230)  traMADol (ULTRAM) tablet 50 mg (50 mg Oral Given 05/24/20 1231)    ED Course  I have reviewed the triage vital signs and the nursing notes.  Pertinent labs & imaging results that were available during my care of the patient were reviewed by me and considered in my medical decision making (see chart for details). It is 87 year old female presented today after fall.  This was a mechanical fall.  She seems to be in no pain.  Will obtain CT imaging of head and C-spine.  She does have complaint of some discomfort in her T-spine although she has no palpable midline tenderness.  Will obtain x-ray of this.  She states this occurred after a fall approximately 1 week ago.  After patient's daughter arrived patient is now complaining of right hip pain, right knee pain, right shoulder pain  will obtain x-rays of these areas as well.  Clinical Course as of 05/24/20 1545  Mon May 24, 2020  1132 Small T4 compression fracture. [WF]  1132 CT head/C spine unremarkable. [WF]    Clinical Course User Index [WF] Tedd Sias, PA   X-ray of right knee, right shoulder unremarkable.  There is some degenerative changes and some chronic appearing abnormalities here.  No acute abnormalities however.  Pelvic x-ray shows improved pelvic rami fracture from March 2021.  No acute fractures.  I discussed this case with my attending physician who cosigned this note including patient's presenting symptoms, physical exam, and planned diagnostics and interventions. Attending physician stated agreement with plan or made changes to plan which were implemented.   Attending physician assessed patient at bedside.  MDM Rules/Calculators/A&P                          Patient states that she feels that her baseline after the tramadol and Tylenol she was given here.  Discharge with her pastor.  All questions answered best my ability.  Final Clinical Impression(s) / ED Diagnoses Final diagnoses:  Fall, initial encounter  Compression fracture of T4 vertebra, initial encounter [facility])    Rx / [place] Orders ED Discharge Orders    None       Tedd Sias, [place] 05/24/20 1546    Carmin Muskrat, MD 05/25/20 [phone]

## 2020-05-24 NOTE — ED Notes (Signed)
Patient transported to ray

## 2020-05-24 NOTE — ED Triage Notes (Signed)
Patient stated she fell forward while at the toilet reaching down for something, denies LOC, on ASA, denies pain when asked.

## 2020-05-24 NOTE — ED Notes (Signed)
Family at bedside. 

## 2020-06-07 NOTE — Telephone Encounter (Signed)
Called [facility] and spoke with Vickie and advised that orders keep coming in for Dr. Angelena Form to sign but that at her last hospital admission she was not seen by cardiology and she has a primary physician.  They are sending the PCP the plan of care as well.  Per Sheilah Mins will remove Dr. Angelena Form as [facility] ordering provider.  They will continue to send to Dr. Arelia Sneddon.

## 2020-06-11 ENCOUNTER — Encounter (HOSPITAL_COMMUNITY): Payer: Self-pay

## 2020-06-11 ENCOUNTER — Observation Stay (HOSPITAL_COMMUNITY)
Admission: EM | Admit: 2020-06-11 | Discharge: 2020-06-12 | Disposition: A | Discharge status: Home / Self Care | Payer: Medicare PPO | Attending: Internal Medicine | Admitting: Internal Medicine

## 2020-06-11 ENCOUNTER — Other Ambulatory Visit: Payer: Self-pay

## 2020-06-11 ENCOUNTER — Emergency Department (HOSPITAL_COMMUNITY): Payer: Medicare PPO

## 2020-06-11 DIAGNOSIS — I5033 Acute on chronic diastolic (congestive) heart failure: Secondary | ICD-10-CM | POA: Diagnosis not present

## 2020-06-11 DIAGNOSIS — E785 Hyperlipidemia, unspecified: Secondary | ICD-10-CM | POA: Diagnosis not present

## 2020-06-11 DIAGNOSIS — I2510 Atherosclerotic heart disease of native coronary artery without angina pectoris: Secondary | ICD-10-CM | POA: Diagnosis not present

## 2020-06-11 DIAGNOSIS — I4891 Unspecified atrial fibrillation: Secondary | ICD-10-CM | POA: Diagnosis not present

## 2020-06-11 DIAGNOSIS — K7469 Other cirrhosis of liver: Secondary | ICD-10-CM

## 2020-06-11 DIAGNOSIS — Z79899 Other long term (current) drug therapy: Secondary | ICD-10-CM | POA: Diagnosis not present

## 2020-06-11 DIAGNOSIS — I959 Hypotension, unspecified: Secondary | ICD-10-CM | POA: Diagnosis not present

## 2020-06-11 DIAGNOSIS — I10 Essential (primary) hypertension: Secondary | ICD-10-CM | POA: Diagnosis not present

## 2020-06-11 DIAGNOSIS — Z7982 Long term (current) use of aspirin: Secondary | ICD-10-CM | POA: Insufficient documentation

## 2020-06-11 DIAGNOSIS — I480 Paroxysmal atrial fibrillation: Secondary | ICD-10-CM | POA: Diagnosis not present

## 2020-06-11 DIAGNOSIS — Z955 Presence of coronary angioplasty implant and graft: Secondary | ICD-10-CM | POA: Insufficient documentation

## 2020-06-11 DIAGNOSIS — R0602 Shortness of breath: Secondary | ICD-10-CM | POA: Diagnosis present

## 2020-06-11 DIAGNOSIS — Z96641 Presence of right artificial hip joint: Secondary | ICD-10-CM | POA: Diagnosis not present

## 2020-06-11 DIAGNOSIS — R55 Syncope and collapse: Secondary | ICD-10-CM

## 2020-06-11 DIAGNOSIS — Z85040 Personal history of malignant carcinoid tumor of rectum: Secondary | ICD-10-CM | POA: Insufficient documentation

## 2020-06-11 DIAGNOSIS — I5032 Chronic diastolic (congestive) heart failure: Secondary | ICD-10-CM | POA: Diagnosis present

## 2020-06-11 DIAGNOSIS — I110 Hypertensive heart disease with heart failure: Secondary | ICD-10-CM | POA: Insufficient documentation

## 2020-06-11 DIAGNOSIS — R627 Adult failure to thrive: Secondary | ICD-10-CM | POA: Insufficient documentation

## 2020-06-11 DIAGNOSIS — Z20822 Contact with and (suspected) exposure to covid-19: Secondary | ICD-10-CM | POA: Insufficient documentation

## 2020-06-11 LAB — CBC WITH DIFFERENTIAL/PLATELET
Abs Immature Granulocytes: 0.03 10*3/uL (ref 0.00–0.07)
Basophils Absolute: 0 10*3/uL (ref 0.0–0.1)
Basophils Relative: 0 %
Eosinophils Absolute: 0.2 10*3/uL (ref 0.0–0.5)
Eosinophils Relative: 2 %
HCT: 44.3 % (ref 36.0–46.0)
Hemoglobin: 13.9 g/dL (ref 12.0–15.0)
Immature Granulocytes: 0 %
Lymphocytes Relative: 27 %
Lymphs Abs: 2.2 10*3/uL (ref 0.7–4.0)
MCH: 29.8 pg (ref 26.0–34.0)
MCHC: 31.4 g/dL (ref 30.0–36.0)
MCV: 95.1 fL (ref 80.0–100.0)
Monocytes Absolute: 0.6 10*3/uL (ref 0.1–1.0)
Monocytes Relative: 7 %
Neutro Abs: 5.1 10*3/uL (ref 1.7–7.7)
Neutrophils Relative %: 64 %
Platelets: 245 10*3/uL (ref 150–400)
RBC: 4.66 MIL/uL (ref 3.87–5.11)
RDW: 14.1 % (ref 11.5–15.5)
WBC: 8.2 10*3/uL (ref 4.0–10.5)
nRBC: 0 % (ref 0.0–0.2)

## 2020-06-11 LAB — URINALYSIS, ROUTINE W REFLEX MICROSCOPIC
Bilirubin Urine: NEGATIVE
Glucose, UA: NEGATIVE mg/dL
Hgb urine dipstick: NEGATIVE
Ketones, ur: NEGATIVE mg/dL
Leukocytes,Ua: NEGATIVE
Nitrite: NEGATIVE
Protein, ur: NEGATIVE mg/dL
Specific Gravity, Urine: 1.011 (ref 1.005–1.030)
pH: 5 (ref 5.0–8.0)

## 2020-06-11 LAB — COMPREHENSIVE METABOLIC PANEL
ALT: 28 U/L (ref 0–44)
AST: 39 U/L (ref 15–41)
Albumin: 3.7 g/dL (ref 3.5–5.0)
Alkaline Phosphatase: 138 U/L — ABNORMAL HIGH (ref 38–126)
Anion gap: 7 (ref 5–15)
BUN: 24 mg/dL — ABNORMAL HIGH (ref 8–23)
CO2: 27 mmol/L (ref 22–32)
Calcium: 9.8 mg/dL (ref 8.9–10.3)
Chloride: 101 mmol/L (ref 98–111)
Creatinine, Ser: 0.86 mg/dL (ref 0.44–1.00)
GFR, Estimated: 60 mL/min — ABNORMAL LOW (ref >60)
Glucose, Bld: 105 mg/dL — ABNORMAL HIGH (ref 70–99)
Potassium: 4.5 mmol/L (ref 3.5–5.1)
Sodium: 135 mmol/L (ref 135–145)
Total Bilirubin: 0.5 mg/dL (ref 0.3–1.2)
Total Protein: 7.9 g/dL (ref 6.5–8.1)

## 2020-06-11 LAB — RESP PANEL BY RT-PCR (FLU A&B, COVID) ARPGX2
Influenza A by PCR: NEGATIVE
Influenza B by PCR: NEGATIVE
SARS Coronavirus 2 by RT PCR: NEGATIVE

## 2020-06-11 LAB — LACTIC ACID, PLASMA
Lactic Acid, Venous: 1.4 mmol/L (ref 0.5–1.9)
Lactic Acid, Venous: 0.9 mmol/L (ref 0.5–1.9)

## 2020-06-11 LAB — MAGNESIUM: Magnesium: 2.1 mg/dL (ref 1.7–2.4)

## 2020-06-11 LAB — PROTIME-INR
INR: 0.9 (ref 0.8–1.2)
Prothrombin Time: 12.2 seconds (ref 11.4–15.2)

## 2020-06-11 LAB — BRAIN NATRIURETIC PEPTIDE: B Natriuretic Peptide: 266.2 pg/mL — ABNORMAL HIGH (ref 0.0–100.0)

## 2020-06-11 LAB — TROPONIN I (HIGH SENSITIVITY)
Troponin I (High Sensitivity): 14 ng/L (ref <18)
Troponin I (High Sensitivity): 23 ng/L — ABNORMAL HIGH (ref <18)

## 2020-06-11 LAB — TSH: TSH: 11.118 u[IU]/mL — ABNORMAL HIGH (ref 0.350–4.500)

## 2020-06-11 MED ORDER — METOPROLOL SUCCINATE ER 25 MG PO TB24
12.5000 mg | ORAL_TABLET | Freq: Every day | ORAL | Status: DC
  Filled 2020-06-11: qty 1

## 2020-06-11 MED ORDER — ACETAMINOPHEN 325 MG PO TABS: 650.0000 mg | ORAL_TABLET | ORAL | Status: DC | PRN

## 2020-06-11 MED ORDER — AMIODARONE HCL IN DEXTROSE 360-4.14 MG/200ML-% IV SOLN
60.0000 mg/h | INTRAVENOUS | Status: DC
  Administered 2020-06-11: 60 mg/h via INTRAVENOUS
  Filled 2020-06-11: qty 200

## 2020-06-11 MED ORDER — ASPIRIN EC 81 MG PO TBEC
81.0000 mg | DELAYED_RELEASE_TABLET | Freq: Every morning | ORAL | Status: DC
  Administered 2020-06-12: 81 mg via ORAL
  Filled 2020-06-11: qty 1

## 2020-06-11 MED ORDER — CLONAZEPAM 0.25 MG PO TBDP
0.2500 mg | ORAL_TABLET | Freq: Every day | ORAL | Status: DC
  Administered 2020-06-11: 0.25 mg via ORAL
  Filled 2020-06-11: qty 1

## 2020-06-11 MED ORDER — HEPARIN SODIUM (PORCINE) 5000 UNIT/ML IJ SOLN
5000.0000 [IU] | Freq: Three times a day (TID) | INTRAMUSCULAR | Status: DC
  Administered 2020-06-11 – 2020-06-12 (×3): 5000 [IU] via SUBCUTANEOUS
  Filled 2020-06-11 (×3): qty 1

## 2020-06-11 MED ORDER — SODIUM CHLORIDE 0.9 % IV BOLUS
500.0000 mL | Freq: Once | INTRAVENOUS | Status: AC
  Administered 2020-06-11: 500 mL via INTRAVENOUS

## 2020-06-11 MED ORDER — ACETAMINOPHEN 500 MG PO TABS: 500.0000 mg | ORAL_TABLET | Freq: Every day | ORAL | Status: DC | PRN

## 2020-06-11 MED ORDER — FUROSEMIDE 20 MG PO TABS
20.0000 mg | ORAL_TABLET | Freq: Every morning | ORAL | Status: DC
  Administered 2020-06-12: 20 mg via ORAL
  Filled 2020-06-11: qty 1

## 2020-06-11 MED ORDER — DIGOXIN 0.25 MG/ML IJ SOLN
0.1250 mg | Freq: Once | INTRAMUSCULAR | Status: AC
  Administered 2020-06-11: 0.125 mg via INTRAVENOUS
  Filled 2020-06-11 (×2): qty 2

## 2020-06-11 MED ORDER — MELATONIN 5 MG PO TABS
5.0000 mg | ORAL_TABLET | Freq: Every day | ORAL | Status: DC
  Administered 2020-06-11: 5 mg via ORAL
  Filled 2020-06-11 (×2): qty 1

## 2020-06-11 MED ORDER — POLYVINYL ALCOHOL 1.4 % OP SOLN
Freq: Every day | OPHTHALMIC | Status: DC
  Filled 2020-06-11: qty 15

## 2020-06-11 MED ORDER — DILTIAZEM HCL-DEXTROSE 125-5 MG/125ML-% IV SOLN (PREMIX)
5.0000 mg/h | INTRAVENOUS | Status: DC
  Administered 2020-06-11: 5 mg/h via INTRAVENOUS
  Filled 2020-06-11: qty 125

## 2020-06-11 MED ORDER — ONDANSETRON HCL 4 MG/2ML IJ SOLN: 4.0000 mg | Freq: Four times a day (QID) | INTRAMUSCULAR | Status: DC | PRN

## 2020-06-11 MED ORDER — SENNOSIDES-DOCUSATE SODIUM 8.6-50 MG PO TABS: 1.0000 | ORAL_TABLET | Freq: Two times a day (BID) | ORAL | Status: DC | PRN

## 2020-06-11 MED ORDER — NITROGLYCERIN 0.4 MG SL SUBL: 0.4000 mg | SUBLINGUAL_TABLET | SUBLINGUAL | Status: DC | PRN

## 2020-06-11 MED ORDER — AMIODARONE HCL IN DEXTROSE 360-4.14 MG/200ML-% IV SOLN
30.0000 mg/h | INTRAVENOUS | Status: DC
  Administered 2020-06-11: 30 mg/h via INTRAVENOUS
  Filled 2020-06-11 (×2): qty 200

## 2020-06-11 MED ORDER — SERTRALINE HCL 50 MG PO TABS
25.0000 mg | ORAL_TABLET | Freq: Every day | ORAL | Status: DC
  Administered 2020-06-11: 25 mg via ORAL
  Filled 2020-06-11 (×2): qty 1

## 2020-06-11 MED ORDER — AMIODARONE HCL 200 MG PO TABS: 100.0000 mg | ORAL_TABLET | Freq: Every day | ORAL | Status: DC

## 2020-06-11 NOTE — ED Triage Notes (Addendum)
Called EMS for weakness after walking to bathroom this morning. Just started amiodarone. EMS reports orthostatic VS noted upon moving to stretcher. Hx of CHF and afib, colostomy. Pt states she did take her lasix today. Pt speaking in full sentences at this time, slight tachypnea noted currently. Pt reports decreased fluid intake.

## 2020-06-11 NOTE — H&P (Signed)
History and Physical    [name]:[id number] DOB: 05-30-1916 DOA: 06/11/2020  PCP: Leonard Downing, MD (Confirm with patient/family/NH records and if not entered, this has to be entered at [facility] point of entry) Patient coming from: home  I have personally briefly reviewed patient's old medical records in [place]  Chief Complaint: SOB, weakness  HPI: [name] is a 87 y.o. female with medical history significant of CAD, HFpEF grade III, PAF w/ h/o RVR, HTN has had several admissions and ED visits for PAF with RVR. She was at home and had increased weakness and increased SOB for which she came to [facility] for evaluation.  ED Course: T 97.7  BP 104/73  HR 135  R 14. Patient wasn't in distress. Denied chest pain. Cmet was nl, CBCD nl, BNP 266  Troponin #1 14, Lactic acid 1.4, to 0.9 on repeat. TSH 11.12. EKG revealed a flutter with RVR at 134. Patient was started by EDP on diltiazem infusion but her blood pressure dropped. After diltiazem was stopped she received IV digoxin which did not keep rate down. Dr. Marlou Porch had been consulted. [facility] called to admit for continued treatment.  Review of Systems: As per HPI otherwise 10 point review of systems negative.    Past Medical History:  Diagnosis Date  . Arthritis    FINGERS   . CAD (coronary artery disease) CARDIOLOGIST-- DR Angelena Form   a. s/p INF STEMI 7/12: tx with BMS to RCA;  b. cath 08/26/10: pLAD 30%, mLAD 50%, D1 40%, pCFX 95%, mRCA occluded;   c. staged PCI of pCFX with BMS;   d. echo 7/12:   EF 60-65%, mild RAE, mild to moderate AI, mild MR, moderate TR, RVE, PASP 47  . Complication of anesthesia PT STATES "MADE HER FEEL CRAZY"  . Degeneration of eye    left eye cornea  . Dyspnea   . First degree heart block   . Heart palpitations PAC'S AND SVT RUN'S  PER CARDIOLOGIST NOTE  . History of ST elevation myocardial infarction (STEMI) 08-26-2010-- INFERIOR WALL   S/P PCI  BMS IN RCA AND PROX. CX  . Hyperlipidemia   .  Hypertension   . Impaired hearing BILATERAL HEARING AIDS  . Osteopenia   . PAF (paroxysmal atrial fibrillation) ([place])   . PONV (postoperative nausea and vomiting)   . Pulmonary nodules BENIGN  PER CT  10-12-2010  . Rectal Cancer 08/2010   adenocarcinoma   S/P PARTIAL PROCTECTOMY (NO CHEMO OR RADIATION)  . 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 S/P coronary artery stent placement 08/2010   X2  BM    Past Surgical History:  Procedure Laterality Date  . ABDOMINAL HYSTERECTOMY  1950's   AND APPENDECTOMY  . CATARACT EXTRACTION W/ INTRAOCULAR LENS  IMPLANT, BILATERAL    . CORONARY ANGIOPLASTY WITH STENT PLACEMENT  08-26-2010  DR Wren   PCI, BM STENT IN RCA  . CORONARY ANGIOPLASTY WITH STENT PLACEMENT  08-29-2010  DR Baxter   PCI, BM STENT IN PROXIMAL CIRCUMFLEX  . EXCISION BENIGN CYST RIGHT BREAST    . FISTULA PLUG N/A 06/21/2012   Procedure: insertion of FISTULA PLUG;  Surgeon: Leighton Ruff, MD;  Location: [facility];  Service: General;  Laterality: N/A;  . FLEXIBLE SIGMOIDOSCOPY N/A 04/02/2012   Procedure: FLEXIBLE SIGMOIDOSCOPY;  Surgeon: Leighton Ruff, MD;  Location: [facility];  Service: Endoscopy;  Laterality: N/A;  .  KNEE SURGERY Right 1996  . LAPROSCOPY LYSIS ADHESIONS/ DRAINAGE OF PELVIC ABSCESS/ DIVERTING LOOP SIGMOID COLECTOMY  11-22-2010   POST OP RECTOVAGINAL FISTULA  . PARTIAL PROCTECTOMY BY TEM  11-17-2010   RECTAL CANCER  . RELEASE LEFT CARPAL TUNNEL/ OSTEOTOMY LEFT DISTAL RADIUS  11-10-2009  . STAPEDECTOMY  1970'S  . TONSILLECTOMY  CHILD  . TOTAL HIP ARTHROPLASTY Right 1992  . TRANSTHORACIC ECHOCARDIOGRAM  10-10-2011   NORMAL LV SIZE WITH MILD FOCAL BASAL SEPTAL HYPERTROPHY/ EF 55-60%/ NORMAL RV SIZE AND LVSF/ BIATRIAL ENLARGEMENT/ MILD TO MODERATE AI  &  TR  . TYMPANOPLASTY Left 12-23-2009    Soc Hx - born 1918! Married for 6 years, widowed in 1986. She has  two adopted children - a son in [place], a daughter n [place]. She had 4 grandchildren and 4 great-grandchildren. She lives alone but does have help that comes in to assist with heavy house work. Her niece helps to look after her affairs.    reports that she has never smoked. She has never used smokeless tobacco. She reports that she does not drink alcohol and does not use drugs.  Allergies  Allergen Reactions  . Effexor [Venlafaxine Hydrochloride] Nausea And Vomiting  . Penicillins Shortness Of Breath    Has patient had a PCN reaction causing immediate rash, facial/tongue/throat swelling, SOB or lightheadedness with hypotension: Yes Has patient had a PCN reaction causing severe rash involving mucus membranes or skin necrosis: No Has patient had a PCN reaction that required hospitalization: No Has patient had a PCN reaction occurring within the last 10 years: No If all of the above answers are "NO", then may proceed with Cephalosporin use.   . Sulfa Antibiotics Nausea Only  . Dicyclomine Other (See Comments)    Caused confusion  . Lactose Intolerance (Gi) Diarrhea  . Hydrocodone Nausea Only    Family History  Problem Relation Age of Onset  . Kidney disease Mother   . Heart disease Father   . Cancer Brother      Prior to Admission medications   Medication Sig Start Date End Date Taking? Authorizing Provider  acetaminophen (TYLENOL) 500 MG tablet Take 2 tablets (1,000 mg total) by mouth every 6 (six) hours as needed for mild pain. Patient taking differently: Take 500-1,000 mg by mouth daily as needed for headache (pain). 07/06/15  Yes Rama, Venetia Maxon, MD  amiodarone (PACERONE) 200 MG tablet Take 0.5 tablets (100 mg total) by mouth daily. 04/27/20  Yes Fenton, Clint R, PA  aspirin EC 81 MG tablet Take 81 mg by mouth every morning.   Yes [provider]  beta carotene w/minerals (OCUVITE) tablet Take 1 tablet by mouth every morning.   Yes [provider]   cholecalciferol (VITAMIN D3) 25 MCG (1000 UNIT) tablet Take 1,000 Units by mouth every morning.   Yes [provider]  clonazePAM (KLONOPIN) 0.5 MG tablet Take 0.25 mg by mouth at bedtime. 05/12/20  Yes [provider]  furosemide (LASIX) 40 MG tablet Take 20 mg by mouth every morning.   Yes [provider]  melatonin 5 MG TABS Take 5 mg by mouth at bedtime.   Yes [provider]  metoprolol succinate (TOPROL XL) 25 MG 24 hr tablet Take 0.5 tablets (12.5 mg total) by mouth at bedtime. 04/27/20  Yes Fenton, Clint R, PA  Multiple Vitamin (MULTIVITAMIN WITH MINERALS) TABS tablet Take 1 tablet by mouth every morning.   Yes [provider]  nitroGLYCERIN (NITROSTAT) 0.4 MG SL tablet  Place 0.4 mg under the tongue every 5 (five) minutes as needed for chest pain.   Yes [provider]  Polyethyl Glycol-Propyl Glycol (SYSTANE) 0.4-0.3 % GEL ophthalmic gel Place 1 drop into both eyes at bedtime.    Yes [provider]  sertraline (ZOLOFT) 25 MG tablet Take 25 mg by mouth at bedtime. 10/28/19  Yes [provider]  polyethylene glycol (MIRALAX / GLYCOLAX) 17 g packet Take 17 g by mouth daily as needed for moderate constipation or severe constipation. Patient not taking: No sig reported 05/04/20   Amin, Ankit Chirag, MD  polyethylene glycol powder (GLYCOLAX/MIRALAX) 17 GM/SCOOP powder DISSOLVE 1 CAPFUL (17 G) IN WATER AND DRINK DAILY AS NEEDED FOR MODERATE CONSTIPATION OR SEVERE CONSTIPATION. Patient not taking: No sig reported 05/04/20 05/04/21  Amin, Jeanella Flattery, MD  senna-docusate (SENOKOT-S) 8.6-50 MG tablet Take 1 tablet by mouth 2 (two) times daily as needed for mild constipation. Patient not taking: No sig reported 05/04/20   Amin, Ankit Chirag, MD  senna-docusate (SENOKOT-S) 8.6-50 MG tablet TAKE 1 TABLET BY MOUTH TWO TIMES DAILY AS NEEDED FOR MILD CONSTIPATION. Patient not taking: No sig reported 05/04/20 05/04/21  Damita Lack, MD     Physical Exam: Vitals:   06/11/20 1600 06/11/20 1630 06/11/20 1700 06/11/20 1730  BP: (!) 111/94 104/73 (!) 81/62 114/73  Pulse: (!) 109 (!) 117 (!) 134 (!) 135  Resp: 19 14 17 14   Temp:      TempSrc:      SpO2: 99% 100% 100% 100%     Vitals:   06/11/20 1600 06/11/20 1630 06/11/20 1700 06/11/20 1730  BP: (!) 111/94 104/73 (!) 81/62 114/73  Pulse: (!) 109 (!) 117 (!) 134 (!) 135  Resp: 19 14 17 14   Temp:      TempSrc:      SpO2: 99% 100% 100% 100%   General: very bright and alert woman who is very thin, in no distress Eyes: PERRL, lids and conjunctivae normal ENMT: Mucous membranes are moist. Posterior pharynx clear of any exudate or lesions.Normal dentition.  Neck: normal, supple, no masses, no thyromegaly Respiratory:. Normal respiratory effort. No accessory muscle use. Feint bibasilar rales. Cardiovascular: IRIR tachycardic at 135. No extremity edema. 2+ pedal pulses. No carotid bruits.  Abdomen: no tenderness, no masses palpated. No hepatosplenomegaly. Bowel sounds positive. Colostomy LLQ functioning normally. Musculoskeletal: very thin with interosseous wasting both hands, no clubbing / cyanosis. No joint deformity upper and lower extremities. Good ROM, no contractures. Decreased muscle tone.  Skin: no rashes, lesions, ulcers. No induration. Brusiing on her arms, worst at right wrist. Neurologic: CN 2-12 grossly intact.  Psychiatric: Normal judgment and insight. Alert and oriented x 3. Normal mood.     Labs on Admission: I have personally reviewed following labs and imaging studies  CBC: Recent Labs  Lab 06/11/20 1355  WBC 8.2  NEUTROABS 5.1  HGB 13.9  HCT 44.3  MCV 95.1  PLT 99991111   Basic Metabolic Panel: Recent Labs  Lab 06/11/20 1355  NA 135  K 4.5  CL 101  CO2 27  GLUCOSE 105*  BUN 24*  CREATININE 0.86  CALCIUM 9.8  MG 2.1   GFR: CrCl cannot be calculated (Unknown ideal weight.). Liver Function Tests: Recent Labs  Lab 06/11/20 1355  AST  39  ALT 28  ALKPHOS 138*  BILITOT 0.5  PROT 7.9  ALBUMIN 3.7   No results for input(s): LIPASE, AMYLASE in the last 168 hours. No results for input(s):  AMMONIA in the last 168 hours. Coagulation Profile: Recent Labs  Lab 06/11/20 1355  INR 0.9   Cardiac Enzymes: No results for input(s): CKTOTAL, CKMB, CKMBINDEX, TROPONINI in the last 168 hours. BNP (last 3 results) No results for input(s): PROBNP in the last 8760 hours. HbA1C: No results for input(s): HGBA1C in the last 72 hours. CBG: No results for input(s): GLUCAP in the last 168 hours. Lipid Profile: No results for input(s): CHOL, HDL, LDLCALC, TRIG, CHOLHDL, LDLDIRECT in the last 72 hours. Thyroid Function Tests: Recent Labs    06/11/20 1355  TSH 11.118*   Anemia Panel: No results for input(s): VITAMINB12, FOLATE, FERRITIN, TIBC, IRON, RETICCTPCT in the last 72 hours. Urine analysis:    Component Value Date/Time   COLORURINE YELLOW 04/13/2020 1236   APPEARANCEUR CLEAR 04/13/2020 1236   LABSPEC 1.010 04/13/2020 1236   PHURINE 5.0 04/13/2020 1236   GLUCOSEU NEGATIVE 04/13/2020 1236   HGBUR NEGATIVE 04/13/2020 1236   [place] 04/13/2020 1236   KETONESUR NEGATIVE 04/13/2020 1236   PROTEINUR NEGATIVE 04/13/2020 1236   UROBILINOGEN 0.2 10/01/2014 1628   NITRITE NEGATIVE 04/13/2020 1236   LEUKOCYTESUR NEGATIVE 04/13/2020 1236    Radiological Exams on Admission: DG Chest Portable 1 View  Result Date: 06/11/2020 CLINICAL DATA:  4 day history of fatigue. EXAM: PORTABLE CHEST 1 VIEW COMPARISON:  05/03/2020 FINDINGS: 1430 hours. Lungs are hyperexpanded. The cardio pericardial silhouette is enlarged. Valvular annular calcification over the left heart is probably mitral valve related. Interstitial markings are diffusely coarsened with chronic features. The lungs are clear without focal pneumonia, edema, pneumothorax or pleural effusion. The visualized bony structures of the thorax show no acute abnormality.  Telemetry leads overlie the chest. IMPRESSION: Hyperexpansion without acute cardiopulmonary findings. Electronically Signed   By: Misty Stanley M.D.   On: 06/11/2020 15:01    EKG: Independently reviewed. A flutter with RVR at 134, no STEMI  Assessment/Plan Active Problems:   Paroxysmal atrial fibrillation with rapid ventricular response (HCC)   HTN (hypertension)   Acute on chronic diastolic CHF (congestive heart failure) (HCC)   CAD (coronary artery disease)   Hyperlipidemia   Other cirrhosis of liver (HCC)   Atrial fibrillation with RVR ([place])    1. PAF with RVR - patient intolerant of IV diltiazem due to drop in BP. At home she is on metoprolol bid and amiodarone 100 mg daily. Plan Progress floor admission  IV amiodarone at 60 mg/hr x 60 hrs then 30 mg/hr  Continue metoprolol  Cardiology to see in AM  2. Acute on chronic HFpEF - mild elevation in BNP and mild rales on exam. No acute respiratory distress Plan Continue home dose of lasix  Oxygen to keep sats> 90%  3. HTN- BP is soft, see #1 above  4. HLD - continue home meds  5. Cirrhosis - discovered on previous admission - patient declined further workup given advanced age  45. Disposition - spoke with niece. Patient has required full assistance which is becoming a problem. Family is requesting assistance with SNF placement. TOC consult ordered along with PT/OT  DVT prophylaxis: heparin SQ  Code Status: DNR  Family Communication: spoke with niece Essie Christine. Understands current problem. She requested assistance with SNF placement.  Disposition Plan: TBD  Consults called: Cardiology - Dr. Marlou Porch  Admission status: inpatient-progressive care    Adella Hare MD [facility] Pager [phone]  If 7PM-7AM, please contact night-coverage [email or web address] Password [facility]  06/11/2020, 5:49 PM

## 2020-06-11 NOTE — Progress Notes (Signed)
   06/11/20 1823  Assess: MEWS Score  Temp 98 F (36.7 C)  Pulse Rate (!) 113  ECG Heart Rate (!) 113  Resp 18  SpO2 97 %  Assess: MEWS Score  MEWS Temp 0  MEWS Systolic 1  MEWS Pulse 2  MEWS RR 0  MEWS LOC 0  MEWS Score 3  MEWS Score Color Yellow  Assess: if the MEWS score is Yellow or Red  Were vital signs taken at a resting state? Yes  Focused Assessment No change from prior assessment  Early Detection of Sepsis Score *See Row Information* Low  MEWS guidelines implemented *See Row Information* Yes  Notify: Charge Nurse/RN  Name of Charge Nurse/RN Notified Vaughan Basta  Date Charge Nurse/RN Notified 06/11/20  Time Charge Nurse/RN Notified 1823  To start Pt on Amio gtt.  Awting for order to be approved by Pharmacy

## 2020-06-11 NOTE — Plan of Care (Signed)

## 2020-06-11 NOTE — Progress Notes (Signed)
[facility] night shift.  The nursing staff reported that the patient's heart rate is 59 bpm and would like instructions for metoprolol and amiodarone administration.  Metoprolol will be held.  Her amiodarone infusion has been decreased from 33 to 16.67 mL/h.  Tennis Must, MD.

## 2020-06-11 NOTE — ED Notes (Signed)
Patient to floor with transport at this time in NAD.

## 2020-06-11 NOTE — Progress Notes (Signed)
         06/11/20 1842  Assess: MEWS Score  BP 99/66  Pulse Rate (!) 117  ECG Heart Rate (!) 117  Resp (!) 23  SpO2 100 %  Assess: if the MEWS score is Yellow or Red  Were vital signs taken at a resting state? Yes  Focused Assessment No change from prior assessment  Early Detection of Sepsis Score *See Row Information* Low  MEWS guidelines implemented *See Row Information* Yes  Escalate  MEWS: Escalate Yellow: discuss with charge nurse/RN and consider discussing with provider and RRT         Amio gtt started

## 2020-06-11 NOTE — ED Notes (Signed)
dilt drip to be held per ED MD. Verbal order to give another 500cc bolus at this time.

## 2020-06-11 NOTE — Progress Notes (Signed)
   06/11/20 1842  Assess: MEWS Score  BP 99/66  Pulse Rate (!) 117  ECG Heart Rate (!) 117  Resp (!) 23  SpO2 100 %  Assess: MEWS Score  MEWS Temp 0  MEWS Systolic 1  MEWS Pulse 2  MEWS RR 1  MEWS LOC 0  MEWS Score 4  MEWS Score Color Red  Assess: if the MEWS score is Yellow or Red  Were vital signs taken at a resting state? Yes  Focused Assessment No change from prior assessment  Early Detection of Sepsis Score *See Row Information* Low  MEWS guidelines implemented *See Row Information* Yes  Take Vital Signs  Increase Vital Sign Frequency  Red: Q 1hr X 4 then Q 4hr X 4, if remains red, continue Q 4hrs  Escalate  MEWS: Escalate Red: discuss with charge nurse/RN and provider, consider discussing with RRT

## 2020-06-11 NOTE — ED Notes (Signed)
Cards at bedside. Warm blankets applied.

## 2020-06-11 NOTE — ED Provider Notes (Signed)
[facility] EMERGENCY DEPARTMENT Provider Note   CSN: [id number] Arrival date & time: 06/11/20  1316     History Chief Complaint  Patient presents with  . Shortness of Breath    [name] is a 87 y.o. female.  The history is provided by the patient and medical records. No language interpreter was used.  Shortness of Breath Severity:  Moderate Onset quality:  Gradual Duration:  4 days Timing:  Intermittent Progression:  Waxing and waning Chronicity:  New Context: not URI   Relieved by:  Nothing Worsened by:  Exertion Ineffective treatments:  None tried Associated symptoms: no abdominal pain, no chest pain, no cough, no fever, no headaches, no rash and no wheezing   Associated symptoms comment:  Near syncope      Past Medical History:  Diagnosis Date  . Arthritis    FINGERS   . CAD (coronary artery disease) CARDIOLOGIST-- DR Angelena Form   a. s/p INF STEMI 7/12: tx with BMS to RCA;  b. cath 08/26/10: pLAD 30%, mLAD 50%, D1 40%, pCFX 95%, mRCA occluded;   c. staged PCI of pCFX with BMS;   d. echo 7/12:   EF 60-65%, mild RAE, mild to moderate AI, mild MR, moderate TR, RVE, PASP 47  . Complication of anesthesia PT STATES "MADE HER FEEL CRAZY"  . Degeneration of eye    left eye cornea  . Dyspnea   . First degree heart block   . Heart palpitations PAC'S AND SVT RUN'S  PER CARDIOLOGIST NOTE  . History of ST elevation myocardial infarction (STEMI) 08-26-2010-- INFERIOR WALL   S/P PCI  BMS IN RCA AND PROX. CX  . Hyperlipidemia   . Hypertension   . Impaired hearing BILATERAL HEARING AIDS  . Osteopenia   . PAF (paroxysmal atrial fibrillation) ([place])   . PONV (postoperative nausea and vomiting)   . Pulmonary nodules BENIGN  PER CT  10-12-2010  . Rectal Cancer 08/2010   adenocarcinoma   S/P PARTIAL PROCTECTOMY (NO CHEMO OR RADIATION)  . 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 S/P colostomy (HCC) SECONDARY TO RECTOVAGINAL  FISTULA  . S/P coronary artery stent placement 08/2010   X2  BM    Patient Active Problem List   Diagnosis Date Noted  . Atrial fibrillation with RVR ([place]) 05/03/2020  . Other cirrhosis of liver ([place]) 05/01/2020  . Paroxysmal atrial fibrillation with rapid ventricular response ([place]) 04/30/2020  . Acute on chronic diastolic CHF (congestive heart failure) ([place]) 04/30/2020  . Elevated LFTs 04/30/2020  . AKI (acute kidney injury) ([place]) 04/30/2020  . Atrial flutter ([place]) 04/27/2020  . Secondary hypercoagulable state ([place]) 04/27/2020  . Closed fracture of multiple pubic rami, left, initial encounter ([place]) 09/06/2019  . Slurred speech 02/23/2017  . PAF (paroxysmal atrial fibrillation) ([place]) 02/23/2017  . Depression with anxiety 02/23/2017  . TIA (transient ischemic attack)   . Fracture, tibia and fibula 07/06/2015  . Tibia/fibula fracture 07/03/2015  . Lung nodule seen on imaging study 09/05/2013  . Exposure to TB 09/05/2013  . SOB (shortness of breath) 10/02/2011  . Vascular skin changes 07/17/2011  . 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 Anorexia post-op 12/08/2010  . HTN (hypertension) 11/03/2010  . Dizziness 09/15/2010  . CAD (coronary artery disease)   . Hyperlipidemia   . Rectal cancer, pT2uN0(pNX) s/p TEM partial proctectomy 09/07/2010    Past Surgical History:  Procedure Laterality Date  . ABDOMINAL HYSTERECTOMY  1950's  AND APPENDECTOMY  . CATARACT EXTRACTION W/ INTRAOCULAR LENS  IMPLANT, BILATERAL    . CORONARY ANGIOPLASTY WITH STENT PLACEMENT  08-26-2010  DR [place]   PCI, BM STENT IN RCA  . CORONARY ANGIOPLASTY WITH STENT PLACEMENT  08-29-2010  DR [place]   PCI, BM STENT IN PROXIMAL CIRCUMFLEX  . EXCISION BENIGN CYST RIGHT BREAST    . FISTULA PLUG N/A 06/21/2012   Procedure: insertion of FISTULA PLUG;  Surgeon: Leighton Ruff, MD;  Location: [facility];  Service: General;  Laterality: N/A;  . FLEXIBLE SIGMOIDOSCOPY N/A  04/02/2012   Procedure: FLEXIBLE SIGMOIDOSCOPY;  Surgeon: Leighton Ruff, MD;  Location: [facility];  Service: Endoscopy;  Laterality: N/A;  . KNEE SURGERY Right 1996  . LAPROSCOPY LYSIS ADHESIONS/ DRAINAGE OF PELVIC ABSCESS/ DIVERTING LOOP SIGMOID COLECTOMY  11-22-2010   POST OP RECTOVAGINAL FISTULA  . PARTIAL PROCTECTOMY BY TEM  11-17-2010   RECTAL CANCER  . RELEASE LEFT CARPAL TUNNEL/ OSTEOTOMY LEFT DISTAL RADIUS  11-10-2009  . STAPEDECTOMY  1970'S  . TONSILLECTOMY  CHILD  . TOTAL HIP ARTHROPLASTY Right 1992  . TRANSTHORACIC ECHOCARDIOGRAM  10-10-2011   NORMAL LV SIZE WITH MILD FOCAL BASAL SEPTAL HYPERTROPHY/ EF 55-60%/ NORMAL RV SIZE AND LVSF/ BIATRIAL ENLARGEMENT/ MILD TO MODERATE AI  &  TR  . TYMPANOPLASTY Left 12-23-2009     OB History   No obstetric history on file.     Family History  Problem Relation Age of Onset  . Kidney disease Mother   . Heart disease Father   . Cancer Brother     Social History   Tobacco Use  . Smoking status: Never Smoker  . Smokeless tobacco: Never Used  Substance Use Topics  . Alcohol use: No  . Drug use: No    Home Medications Prior to Admission medications   Medication Sig Start Date End Date Taking? Authorizing Provider  acetaminophen (TYLENOL) 500 MG tablet Take 2 tablets (1,000 mg total) by mouth every 6 (six) hours as needed for mild pain. 07/06/15   Rama, Venetia Maxon, MD  amiodarone (PACERONE) 200 MG tablet Take 0.5 tablets (100 mg total) by mouth daily. 04/27/20   Fenton, Clint R, PA  aspirin EC 81 MG tablet Take 81 mg by mouth daily.    [provider]  beta carotene w/minerals (OCUVITE) tablet Take 1 tablet by mouth daily.    [provider]  BIOTIN PO Take 1 tablet by mouth daily.     [provider]  Cholecalciferol (VITAMIN D) 50 MCG (2000 UT) tablet Take 2,000 Units by mouth daily.    [provider]  furosemide (LASIX) 40 MG tablet Take 40 mg by mouth daily. Per patient taking 1/2 tablet     [provider]  melatonin 5 MG TABS Take 5 mg by mouth at bedtime.    [provider]  metoprolol succinate (TOPROL XL) 25 MG 24 hr tablet Take 0.5 tablets (12.5 mg total) by mouth at bedtime. 04/27/20   Fenton, Clint R, PA  nitroGLYCERIN (NITROSTAT) 0.4 MG SL tablet Place 0.4 mg under the tongue every 5 (five) minutes as needed for chest pain.    [provider]  omeprazole (PRILOSEC) 20 MG capsule Take 20 mg by mouth daily.    [provider]  Polyethyl Glycol-Propyl Glycol (SYSTANE) 0.4-0.3 % GEL ophthalmic gel Place 1 drop into both eyes at bedtime.     [provider]  polyethylene glycol (MIRALAX / GLYCOLAX) 17 g packet Take 17 g  by mouth daily as needed for moderate constipation or severe constipation. 05/04/20   Amin, Ankit Chirag, MD  polyethylene glycol powder (GLYCOLAX/MIRALAX) 17 GM/SCOOP powder DISSOLVE 1 CAPFUL (17 G) IN WATER AND DRINK DAILY AS NEEDED FOR MODERATE CONSTIPATION OR SEVERE CONSTIPATION. 05/04/20 05/04/21  Amin, Jeanella Flattery, MD  senna-docusate (SENOKOT-S) 8.6-50 MG tablet Take 1 tablet by mouth 2 (two) times daily as needed for mild constipation. 05/04/20   Amin, Ankit Chirag, MD  senna-docusate (SENOKOT-S) 8.6-50 MG tablet TAKE 1 TABLET BY MOUTH TWO TIMES DAILY AS NEEDED FOR MILD CONSTIPATION. 05/04/20 05/04/21  Damita Lack, MD  sertraline (ZOLOFT) 25 MG tablet Take 25 mg by mouth at bedtime. 10/28/19   [provider]    Allergies    Effexor [venlafaxine hydrochloride], Penicillins, Sulfa antibiotics, Dicyclomine, Lactose intolerance (gi), and Hydrocodone  Review of Systems   Review of Systems  Constitutional: Positive for fatigue. Negative for chills and fever.  HENT: Negative for congestion.   Eyes: Negative for visual disturbance.  Respiratory: Positive for shortness of breath. Negative for cough, chest tightness and wheezing.   Cardiovascular: Positive for palpitations. Negative for chest pain and leg  swelling.  Gastrointestinal: Negative for abdominal pain, diarrhea and nausea.  Genitourinary: Negative for enuresis.  Musculoskeletal: Negative for back pain.  Skin: Negative for rash.  Neurological: Positive for light-headedness. Negative for dizziness, syncope and headaches.  Psychiatric/Behavioral: Negative for agitation.  All other systems reviewed and are negative.   Physical Exam Updated Vital Signs BP 110/64 (BP Location: Right Arm)   Pulse (!) 134   Temp 97.7 F (36.5 C) (Oral)   Resp (!) 22   SpO2 100%   Physical Exam Vitals and nursing note reviewed.  Constitutional:      General: She is not in acute distress.    Appearance: She is well-developed. She is not ill-appearing, toxic-appearing or diaphoretic.  HENT:     Head: Normocephalic and atraumatic.     Mouth/Throat:     Mouth: Mucous membranes are moist.  Eyes:     Conjunctiva/sclera: Conjunctivae normal.  Cardiovascular:     Rate and Rhythm: Regular rhythm. Tachycardia present.     Pulses: Normal pulses.     Heart sounds: No murmur heard.   Pulmonary:     Effort: Pulmonary effort is normal. No respiratory distress.     Breath sounds: Normal breath sounds. No decreased breath sounds, wheezing, rhonchi or rales.  Chest:     Chest wall: No tenderness.  Abdominal:     Palpations: Abdomen is soft.     Tenderness: There is no abdominal tenderness.  Musculoskeletal:     Cervical back: Neck supple.     Right lower leg: No tenderness. No edema.     Left lower leg: No tenderness. No edema.  Skin:    General: Skin is warm and dry.     Capillary Refill: Capillary refill takes less than 2 seconds.  Neurological:     General: No focal deficit present.     Mental Status: She is alert.  Psychiatric:        Mood and Affect: Mood normal.     ED Results / Procedures / Treatments   Labs (all labs ordered are listed, but only abnormal results are displayed) Labs Reviewed  COMPREHENSIVE METABOLIC PANEL -  Abnormal; Notable for the following components:      Result Value   Glucose, Bld 105 (*)    BUN 24 (*)    Alkaline Phosphatase 138 (*)  GFR, Estimated 60 (*)    All other components within normal limits  TSH - Abnormal; Notable for the following components:   TSH 11.118 (*)    All other components within normal limits  BRAIN NATRIURETIC PEPTIDE - Abnormal; Notable for the following components:   B Natriuretic Peptide 266.2 (*)    All other components within normal limits  RESP PANEL BY RT-PCR (FLU A&B, COVID) ARPGX2  URINE CULTURE  CBC WITH DIFFERENTIAL/PLATELET  MAGNESIUM  PROTIME-INR  LACTIC ACID, PLASMA  URINALYSIS, ROUTINE W REFLEX MICROSCOPIC  LACTIC ACID, PLASMA  TROPONIN I (HIGH SENSITIVITY)  TROPONIN I (HIGH SENSITIVITY)    EKG EKG Interpretation  Date/Time:  Friday June 11 2020 13:29:15 EDT Ventricular Rate:  134 PR Interval:  23 QRS Duration: 151 QT Interval:  362 QTC Calculation: 541 R Axis:   -70 Text Interpretation: Sinus tachycardia vs a flutter with RVR Left bundle branch block When compared to prior, faster rate. No STEMI Confirmed by Antony Blackbird [id number]) on 06/11/2020 1:34:25 PM   Radiology DG Chest Portable 1 View  Result Date: 06/11/2020 CLINICAL DATA:  4 day history of fatigue. EXAM: PORTABLE CHEST 1 VIEW COMPARISON:  05/03/2020 FINDINGS: 1430 hours. Lungs are hyperexpanded. The cardio pericardial silhouette is enlarged. Valvular annular calcification over the left heart is probably mitral valve related. Interstitial markings are diffusely coarsened with chronic features. The lungs are clear without focal pneumonia, edema, pneumothorax or pleural effusion. The visualized bony structures of the thorax show no acute abnormality. Telemetry leads overlie the chest. IMPRESSION: Hyperexpansion without acute cardiopulmonary findings. Electronically Signed   By: Misty Stanley M.D.   On: 06/11/2020 15:01    Procedures Procedures   CRITICAL CARE Performed  by: Gwenyth Allegra Farmer Mccahill Total critical care time: 35 minutes Critical care time was exclusive of separately billable procedures and treating other patients. Critical care was necessary to treat or prevent imminent or life-threatening deterioration. Critical care was time spent personally by me on the following activities: development of treatment plan with patient and/or surrogate as well as nursing, discussions with consultants, evaluation of patient's response to treatment, examination of patient, obtaining history from patient or surrogate, ordering and performing treatments and interventions, ordering and review of laboratory studies, ordering and review of radiographic studies, pulse oximetry and re-evaluation of patient's condition.   Medications Ordered in ED Medications  diltiazem (CARDIZEM) 125 mg in dextrose 5% 125 mL (1 mg/mL) infusion (0 mg/hr Intravenous Stopped 06/11/20 1510)  sodium chloride 0.9 % bolus 500 mL (has no administration in time range)  sodium chloride 0.9 % bolus 500 mL (0 mLs Intravenous Stopped 06/11/20 1510)    ED Course  I have reviewed the triage vital signs and the nursing notes.  Pertinent labs & imaging results that were available during my care of the patient were reviewed by me and considered in my medical decision making (see chart for details).    MDM Rules/Calculators/A&P                          [name] is a 87 y.o. female with a past medical history significant for paroxysmal atrial fibrillation and a flutter not on anticoagulation, CAD, hypertension, hyperlipidemia, cirrhosis, prior rectal cancer status post proctectomy, and CHF who presents with 4 days of fatigue, waxing and waning palpitations, shortness of breath, and near syncopal episodes.  Patient reports that she has felt extreme fatigue and tired for the last 4 days.  She reports  occasionally feels palpitations on and off but has been more persistent today.  She is denying any chest  pain but does report shortness of breath.  EMS brought her in on oxygen doing oxygen saturations.  Patient is reportedly feeling somewhat dehydrated and she denies any swelling in her legs.  She denies any fevers, chills, congestion, or cough.  Denies nausea, vomiting, constipation, or diarrhea.  Denies any urinary changes.  She reports that just before arrival, she had an episode where she nearly passed out but was able to sit down and not lose consciousness.  On exam, lungs are clear.  Chest is nontender.  Abdomen is nontender.  Good pulses in extremities.  Legs are nonedematous.  Patient resting comfortably but is on 2 L nasal cannula.  Heart rate is in the 134 range and has not moved the entire time she has been monitored.  EKG did not show STEMI but does show either a flutter with RVR versus sinus tachycardia.  Given her report that she has had a flutter with RVR in the past I suspect that is the case.  We will touch base with cardiology to discuss the EKG and discuss further management.  Given her age of 87 years old, her fast heart rate, fatigue, near syncope, and shortness of breath now on oxygen, I do anticipate she will need admission.  Will discuss what cardiology recommends in regards to starting diltiazem if they think this is A. fib versus giving her more fluids and seeing if sinus tachycardia improves.  Anticipate reassessment shortly.  2:20 PM Cardiology called and agrees this appears to be a flutter with RVR.  They do recommend starting a low-dose diltiazem drip and admitting to medicine.  They agree with some rehydration.  They will follow along.  Patient's blood pressure went into the 90s on the diltiazem.  It was positive she will get some more fluids.  She still does not appear fluid overloaded on exam.  Will call medicine for admission now that electrolytes and labs have begun to return.  COVID test is still pending.    Final Clinical Impression(s) / ED Diagnoses Final  diagnoses:  SOB (shortness of breath)  Atrial fibrillation with RVR (HCC)  Near syncope     Clinical Impression: 1. SOB (shortness of breath)   2. Atrial fibrillation with RVR ([place])   3. Near syncope     Disposition: Admit  This note was prepared with assistance of Systems analyst. Occasional wrong-word or sound-a-like substitutions may have occurred due to the inherent limitations of voice recognition software.     Malaiah Viramontes, Gwenyth Allegra, MD 06/11/20 1630

## 2020-06-11 NOTE — Consult Note (Signed)
Cardiology Consultation:   Patient ID: [name] MRN: [id number]; DOB: [birth year]  Admit date: 06/11/2020 Date of Consult: 06/11/2020  PCP:  Leonard Downing, [facility]  Cardiologist:  Lauree Chandler, MD  Advanced Practice Provider:  No care team member to display Electrophysiologist:  None       Patient Profile:   [name] is a 87 y.o. female with a hx of atrial fibrillation CAD who is being seen today for the evaluation of atrial fibrillation at the request of Dr. Linda Hedges.  History of Present Illness:   Ms. Narducci is 87 year old about to be 87 on Tuesday here with feeling of fatigue with atrial fibrillation RVR.  She is on amiodarone 100 mg a day at home and has been doing fairly well.  She is also on low-dose metoprolol.  She came in feeling some gradual weakness and shortness of breath.  Overall appears fairly comfortable currently.  She sees Dr. Angelena Form in clinic.  Heart rate in the 130s, originally tried IV diltiazem but blood pressure was too soft.  She was given IV digoxin x1 because of soft blood pressures and rapid heart rate.  No effect.  She is very pleasant currently.  She is excited to have her family come for a birthday celebration on Sunday for her actual birthday on Tuesday.  See below for details   Past Medical History:  Diagnosis Date  . Arthritis    FINGERS   . CAD (coronary artery disease) CARDIOLOGIST-- DR Angelena Form   a. s/p INF STEMI 7/12: tx with BMS to RCA;  b. cath 08/26/10: pLAD 30%, mLAD 50%, D1 40%, pCFX 95%, mRCA occluded;   c. staged PCI of pCFX with BMS;   d. echo 7/12:   EF 60-65%, mild RAE, mild to moderate AI, mild MR, moderate TR, RVE, PASP 47  . Complication of anesthesia PT STATES "MADE HER FEEL CRAZY"  . Degeneration of eye    left eye cornea  . Dyspnea   . First degree heart block   . Heart palpitations PAC'S AND SVT RUN'S  PER CARDIOLOGIST NOTE  . History of ST elevation  myocardial infarction (STEMI) 08-26-2010-- INFERIOR WALL   S/P PCI  BMS IN RCA AND PROX. CX  . Hyperlipidemia   . Hypertension   . Impaired hearing BILATERAL HEARING AIDS  . Osteopenia   . PAF (paroxysmal atrial fibrillation) ([place])   . PONV (postoperative nausea and vomiting)   . Pulmonary nodules BENIGN  PER CT  10-12-2010  . Rectal Cancer 08/2010   adenocarcinoma   S/P PARTIAL PROCTECTOMY (NO CHEMO OR RADIATION)  . 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 S/P coronary artery stent placement 08/2010   X2  BM    Past Surgical History:  Procedure Laterality Date  . ABDOMINAL HYSTERECTOMY  1950's   AND APPENDECTOMY  . CATARACT EXTRACTION W/ INTRAOCULAR LENS  IMPLANT, BILATERAL    . CORONARY ANGIOPLASTY WITH STENT PLACEMENT  08-26-2010  DR Goff   PCI, BM STENT IN RCA  . CORONARY ANGIOPLASTY WITH STENT PLACEMENT  08-29-2010  DR Sachse   PCI, BM STENT IN PROXIMAL CIRCUMFLEX  . EXCISION BENIGN CYST RIGHT BREAST    . FISTULA PLUG N/A 06/21/2012   Procedure: insertion of FISTULA PLUG;  Surgeon: Leighton Ruff, MD;  Location: [place];  Service: General;  Laterality: N/A;  . FLEXIBLE SIGMOIDOSCOPY N/A  04/02/2012   Procedure: FLEXIBLE SIGMOIDOSCOPY;  Surgeon: Leighton Ruff, MD;  Location: [facility];  Service: Endoscopy;  Laterality: N/A;  . KNEE SURGERY Right 1996  . LAPROSCOPY LYSIS ADHESIONS/ DRAINAGE OF PELVIC ABSCESS/ DIVERTING LOOP SIGMOID COLECTOMY  11-22-2010   POST OP RECTOVAGINAL FISTULA  . PARTIAL PROCTECTOMY BY TEM  11-17-2010   RECTAL CANCER  . RELEASE LEFT CARPAL TUNNEL/ OSTEOTOMY LEFT DISTAL RADIUS  11-10-2009  . STAPEDECTOMY  1970'S  . TONSILLECTOMY  CHILD  . TOTAL HIP ARTHROPLASTY Right 1992  . TRANSTHORACIC ECHOCARDIOGRAM  10-10-2011   NORMAL LV SIZE WITH MILD FOCAL BASAL SEPTAL HYPERTROPHY/ EF 55-60%/ NORMAL RV SIZE AND LVSF/ BIATRIAL ENLARGEMENT/ MILD TO MODERATE  AI  &  TR  . TYMPANOPLASTY Left 12-23-2009     Home Medications:  Prior to Admission medications   Medication Sig Start Date End Date Taking? Authorizing Provider  acetaminophen (TYLENOL) 500 MG tablet Take 2 tablets (1,000 mg total) by mouth every 6 (six) hours as needed for mild pain. Patient taking differently: Take 500-1,000 mg by mouth daily as needed for headache (pain). 07/06/15  Yes Rama, Venetia Maxon, MD  amiodarone (PACERONE) 200 MG tablet Take 0.5 tablets (100 mg total) by mouth daily. 04/27/20  Yes Fenton, Clint R, PA  aspirin EC 81 MG tablet Take 81 mg by mouth every morning.   Yes [provider]  beta carotene w/minerals (OCUVITE) tablet Take 1 tablet by mouth every morning.   Yes [provider]  cholecalciferol (VITAMIN D3) 25 MCG (1000 UNIT) tablet Take 1,000 Units by mouth every morning.   Yes [provider]  clonazePAM (KLONOPIN) 0.5 MG tablet Take 0.25 mg by mouth at bedtime. 05/12/20  Yes [provider]  furosemide (LASIX) 40 MG tablet Take 20 mg by mouth every morning.   Yes [provider]  melatonin 5 MG TABS Take 5 mg by mouth at bedtime.   Yes [provider]  metoprolol succinate (TOPROL XL) 25 MG 24 hr tablet Take 0.5 tablets (12.5 mg total) by mouth at bedtime. 04/27/20  Yes Fenton, Clint R, PA  Multiple Vitamin (MULTIVITAMIN WITH MINERALS) TABS tablet Take 1 tablet by mouth every morning.   Yes [provider]  nitroGLYCERIN (NITROSTAT) 0.4 MG SL tablet Place 0.4 mg under the tongue every 5 (five) minutes as needed for chest pain.   Yes [provider]  Polyethyl Glycol-Propyl Glycol (SYSTANE) 0.4-0.3 % GEL ophthalmic gel Place 1 drop into both eyes at bedtime.    Yes [provider]  sertraline (ZOLOFT) 25 MG tablet Take 25 mg by mouth at bedtime. 10/28/19  Yes [provider]  polyethylene glycol (MIRALAX / GLYCOLAX) 17 g packet Take 17 g by mouth daily as needed for moderate  constipation or severe constipation. Patient not taking: No sig reported 05/04/20   Amin, Ankit Chirag, MD  polyethylene glycol powder (GLYCOLAX/MIRALAX) 17 GM/SCOOP powder DISSOLVE 1 CAPFUL (17 G) IN WATER AND DRINK DAILY AS NEEDED FOR MODERATE CONSTIPATION OR SEVERE CONSTIPATION. Patient not taking: No sig reported 05/04/20 05/04/21  Amin, Jeanella Flattery, MD  senna-docusate (SENOKOT-S) 8.6-50 MG tablet Take 1 tablet by mouth 2 (two) times daily as needed for mild constipation. Patient not taking: No sig reported 05/04/20   Amin, Ankit Chirag, MD  senna-docusate (SENOKOT-S) 8.6-50 MG tablet TAKE 1 TABLET BY MOUTH TWO TIMES DAILY AS NEEDED FOR MILD CONSTIPATION. Patient not taking: No sig reported 05/04/20 05/04/21  Damita Lack, MD    Inpatient Medications: Scheduled  Meds: . [START ON 06/12/2020] amiodarone  100 mg Oral Daily  . [START ON 06/12/2020] aspirin EC  81 mg Oral q morning  . clonazePAM  0.25 mg Oral QHS  . [START ON 06/12/2020] furosemide  20 mg Oral q morning  . heparin  5,000 Units Subcutaneous Q8H  . melatonin  5 mg Oral QHS  . metoprolol succinate  12.5 mg Oral QHS  . Polyethyl Glycol-Propyl Glycol   Both Eyes QHS  . sertraline  25 mg Oral QHS   Continuous Infusions: . diltiazem (CARDIZEM) infusion Stopped (06/11/20 1510)   PRN Meds: acetaminophen, acetaminophen, nitroGLYCERIN, ondansetron (ZOFRAN) IV, senna-docusate  Allergies:    Allergies  Allergen Reactions  . Effexor [Venlafaxine Hydrochloride] Nausea And Vomiting  . Penicillins Shortness Of Breath    Has patient had a PCN reaction causing immediate rash, facial/tongue/throat swelling, SOB or lightheadedness with hypotension: Yes Has patient had a PCN reaction causing severe rash involving mucus membranes or skin necrosis: No Has patient had a PCN reaction that required hospitalization: No Has patient had a PCN reaction occurring within the last 10 years: No If all of the above answers are "NO", then may proceed  with Cephalosporin use.   . Sulfa Antibiotics Nausea Only  . Dicyclomine Other (See Comments)    Caused confusion  . Lactose Intolerance (Gi) Diarrhea  . Hydrocodone Nausea Only    Social History:   Social History   Socioeconomic History  . Marital status: Widowed    Spouse name: Not on file  . Number of children: Not on file  . Years of education: Not on file  . Highest education level: Not on file  Occupational History  . Not on file  Tobacco Use  . Smoking status: Never Smoker  . Smokeless tobacco: Never Used  Substance and Sexual Activity  . Alcohol use: No  . Drug use: No  . Sexual activity: Not on file  Other Topics Concern  . Not on file  Social History Narrative  . Not on file   Social Determinants of Health   Financial Resource Strain: Not on file  Food Insecurity: Not on file  Transportation Needs: Not on file  Physical Activity: Not on file  Stress: Not on file  Social Connections: Not on file  Intimate Partner Violence: Not on file    Family History:    Family History  Problem Relation Age of Onset  . Kidney disease Mother   . Heart disease Father   . Cancer Brother      ROS:  Please see the history of present illness.   All other ROS reviewed and negative.     Physical Exam/Data:   Vitals:   06/11/20 1600 06/11/20 1630 06/11/20 1700 06/11/20 1730  BP: (!) 111/94 104/73 (!) 81/62 114/73  Pulse: (!) 109 (!) 117 (!) 134 (!) 135  Resp: 19 14 17 14   Temp:      TempSrc:      SpO2: 99% 100% 100% 100%    Intake/Output Summary (Last 24 hours) at 06/11/2020 1748 Last data filed at 06/11/2020 1618 Gross per 24 hour  Intake 500.87 ml  Output --  Net 500.87 ml   Last 3 Weights 05/04/2020 04/27/2020 04/13/2020  Weight (lbs) 92 lb 14.4 oz 103 lb 3.2 oz 98 lb  Weight (kg) 42.139 kg 46.811 kg 44.453 kg     There is no height or weight on file to calculate BMI.  General: Elderly, pleasant HEENT: normal Lymph: no adenopathy Neck:  no  JVD Endocrine:  No thryomegaly Vascular: No carotid bruits Cardiac:  normal S1, S2; irregularly irregular tachycardic; diastolic murmur noted Lungs:  clear to auscultation bilaterally, no wheezing, rhonchi or rales  Abd: soft, nontender, no hepatomegaly  Ext: no edema Musculoskeletal:  No deformities, BUE and BLE strength normal and equal Skin: warm and dry  Neuro:  CNs 2-12 intact, no focal abnormalities noted Psych:  Normal affect   EKG:  The EKG was personally reviewed and demonstrates: Atrial flutter with rapid ventricular response heart rate 134 bpm  Telemetry:  Telemetry was personally reviewed and demonstrates: Atrial flutter fairly incessant at 134 bpm  Relevant CV Studies:   Echo 02/23/17 demonstrated  Left ventricle: The cavity size was normal. There was moderate  focal basal and mild concentric hypertrophy. Systolic function  was normal. The estimated ejection fraction was in the range of  60% to 65%. Wall motion was normal; there were no regional wall  motion abnormalities. There was a reduced contribution of atrial  contraction to ventricular filling, due to increased ventricular  diastolic pressure or atrial contractile dysfunction. Doppler  parameters are consistent with a reversible restrictive pattern,  indicative of decreased left ventricular diastolic compliance  and/or increased left atrial pressure (grade 3 diastolic  dysfunction). Doppler parameters are consistent with high  ventricular filling pressure.  - Aortic valve: There was mild to moderate regurgitation.  - Mitral valve: Severely calcified annulus.  - Left atrium: The atrium was mildly dilated.  - Pulmonic valve: There was trivial regurgitation.  - Pulmonary arteries: PA peak pressure: 32 mm Hg (S).   Laboratory Data:  High Sensitivity Troponin:   Recent Labs  Lab 06/11/20 1355 06/11/20 1558  TROPONINIHS 14 23*     Chemistry Recent Labs  Lab 06/11/20 1355  NA 135   K 4.5  CL 101  CO2 27  GLUCOSE 105*  BUN 24*  CREATININE 0.86  CALCIUM 9.8  GFRNONAA 60*  ANIONGAP 7    Recent Labs  Lab 06/11/20 1355  PROT 7.9  ALBUMIN 3.7  AST 39  ALT 28  ALKPHOS 138*  BILITOT 0.5   Hematology Recent Labs  Lab 06/11/20 1355  WBC 8.2  RBC 4.66  HGB 13.9  HCT 44.3  MCV 95.1  MCH 29.8  MCHC 31.4  RDW 14.1  PLT 245   BNP Recent Labs  Lab 06/11/20 1355  BNP 266.2*    DDimer No results for input(s): DDIMER in the last 168 hours.   Radiology/Studies:  DG Chest Portable 1 View  Result Date: 06/11/2020 CLINICAL DATA:  4 day history of fatigue. EXAM: PORTABLE CHEST 1 VIEW COMPARISON:  05/03/2020 FINDINGS: 1430 hours. Lungs are hyperexpanded. The cardio pericardial silhouette is enlarged. Valvular annular calcification over the left heart is probably mitral valve related. Interstitial markings are diffusely coarsened with chronic features. The lungs are clear without focal pneumonia, edema, pneumothorax or pleural effusion. The visualized bony structures of the thorax show no acute abnormality. Telemetry leads overlie the chest. IMPRESSION: Hyperexpansion without acute cardiopulmonary findings. Electronically Signed   By: Misty Stanley M.D.   On: 06/11/2020 15:01     Assessment and Plan:   Atrial fibrillation with rapid ventricular response - Fairly comfortable currently although heart rates have been in the 130s.  She does take amiodarone 100 mg at home.  We tried IV diltiazem here in the emergency department however her blood pressure did not tolerate well.  She is on low-dose metoprolol. - I discussed with Dr.  Norins, suggested utilizing IV amiodarone instead without 150 mg IV load.  If tomorrow she is rate controlled, can transition her back to p.o. amiodarone, possibly 200 mg twice a day for a week and then 200 mg a day thereafter just to maintain adequate rate control.  She was taking 100 mg of amiodarone at home but this was just initiated on  04/27/2020 without any loading given her advanced age. -She is not an anticoagulation candidate based upon fall risks advanced age and high risk of bleeding.  This has been discussed previously in clinic. -She has previously had some rapid ventricular response in early March was given fluid bolus and heart rate slowed. -Of course given her advanced age and underlying interventricular conduction disease, but to be very careful with amiodarone.  Watch for any signs of worsening bradycardia.  Coronary artery disease - Stable, no degree of anginal symptoms. Prior inferior STEMI in 2012 total occlusion of proximal RCA treated with bare-metal stent.  Also had a bare-metal stent placed in the circumflex artery.  Hypotension - Stopping IV diltiazem.  Moderate aortic regurgitation - No clinical change.  Fatigue and dyspnea - In review of outpatient clinic notes, she has had this for past few years.  Did feel some improvement on Lasix previously however in July 2021 fractured her pelvis and shoulder.   For questions or updates, please contact [place] Please consult [email or web address] for contact info under    Signed, Candee Furbish, MD  06/11/2020 5:48 PM

## 2020-06-12 DIAGNOSIS — I480 Paroxysmal atrial fibrillation: Secondary | ICD-10-CM | POA: Diagnosis not present

## 2020-06-12 DIAGNOSIS — I2510 Atherosclerotic heart disease of native coronary artery without angina pectoris: Secondary | ICD-10-CM | POA: Diagnosis not present

## 2020-06-12 DIAGNOSIS — I4891 Unspecified atrial fibrillation: Secondary | ICD-10-CM | POA: Diagnosis not present

## 2020-06-12 DIAGNOSIS — Z20822 Contact with and (suspected) exposure to covid-19: Secondary | ICD-10-CM | POA: Diagnosis not present

## 2020-06-12 DIAGNOSIS — I110 Hypertensive heart disease with heart failure: Secondary | ICD-10-CM | POA: Diagnosis not present

## 2020-06-12 LAB — BASIC METABOLIC PANEL
Anion gap: 7 (ref 5–15)
BUN: 19 mg/dL (ref 8–23)
CO2: 28 mmol/L (ref 22–32)
Calcium: 9.2 mg/dL (ref 8.9–10.3)
Chloride: 103 mmol/L (ref 98–111)
Creatinine, Ser: 0.89 mg/dL (ref 0.44–1.00)
GFR, Estimated: 57 mL/min — ABNORMAL LOW (ref >60)
Glucose, Bld: 92 mg/dL (ref 70–99)
Potassium: 4.7 mmol/L (ref 3.5–5.1)
Sodium: 138 mmol/L (ref 135–145)

## 2020-06-12 MED ORDER — AMIODARONE HCL 200 MG PO TABS: ORAL_TABLET | ORAL | 0 refills | Status: DC

## 2020-06-12 MED ORDER — AMIODARONE HCL 200 MG PO TABS
200.0000 mg | ORAL_TABLET | Freq: Two times a day (BID) | ORAL | Status: DC
  Administered 2020-06-12: 200 mg via ORAL
  Filled 2020-06-12: qty 1

## 2020-06-12 MED ORDER — AMIODARONE HCL 200 MG PO TABS: 200.0000 mg | ORAL_TABLET | Freq: Every day | ORAL | Status: DC

## 2020-06-12 NOTE — Plan of Care (Signed)

## 2020-06-12 NOTE — Discharge Summary (Signed)
Physician Discharge Summary  [name] XFG:[id number] DOB: Mar 19, 1916  PCP: Joyce Downing, MD  Admitted from: Home Discharged to: Home  Admit date: 06/11/2020 Discharge date: 06/12/2020  Recommendations for Outpatient Follow-up:    Follow-up Information    Joyce Downing, MD. Schedule an appointment as soon as possible for a visit in 1 week(s).   Specialty: Family Medicine Why: To be seen with repeat labs (CBC & BMP).  May need assistance with placement at an assisted living facility if patient agreeable. Contact information: [place] [address] [phone]        Joyce Blanks, MD. Schedule an appointment as soon as possible for a visit in 2 week(s).   Specialty: Cardiology Why: Please call office for an appointment. Contact information: [place] [address] [place] [address] [phone]                Home Health:  Home Health Orders (From admission, onward)    Start     Ordered   06/12/20 [place]  At discharge       Comments: Resumption with [facility]  Question Answer Comment  To provide the following care/treatments PT   To provide the following care/treatments OT   To provide the following care/treatments RN   To provide the following care/treatments [place]   To provide the following care/treatments Social work      06/12/20 1558           Equipment/Devices: None    Discharge Condition: Improved and stable.   Code Status: DNR Diet recommendation:  Discharge Diet Orders (From admission, onward)    Start     Ordered   06/12/20 0000  Diet - low sodium heart healthy        06/12/20 1613           Discharge Diagnoses:  Active Problems:   CAD (coronary artery disease)   Hyperlipidemia   HTN (hypertension)   Paroxysmal atrial fibrillation with rapid ventricular response (HCC)   Acute on chronic diastolic CHF (congestive heart failure) (HCC)   Other cirrhosis of  liver (HCC)   Atrial fibrillation with RVR (HCC)   Brief Summary: 87 year old female patient, lives alone, ambulates with the help of a walker, medical history significant for but not limited to CAD s/p PCI, chronic diastolic CHF, PAF with history of RVR, HTN, HLD, several admissions and ED visits for PAF with RVR, presented to ED with increased weakness and dyspnea.  She was admitted for A. fib with RVR, developed hypotension with Cardizem infusion, did not respond to digoxin, subsequently started on IV amiodarone infusion without bolus and reverted to sinus rhythm, transitioned to oral amiodarone.  Cardiology consulted and cleared her for discharge.   Assessment & Plan:   Atrial fibrillation with RVR Sees Dr. Angelena Form, Cardiology as outpatient.  Was on amiodarone 100 mg daily and Toprol-XL 12.5 mg at bedtime, PTA.  Presented with symptomatic RVR in the 30s.  Developed hypotension with IV Cardizem, discontinued.  Did not respond to a single dose of digoxin 125 mg IV in ED.  Started on IV amiodarone drip without bolus, overnight drip rate had to be reduced to 2 bradycardia.  Patient reverted to sinus rhythm.  Cardiology follow-up appreciated, have transitioned her to amiodarone 200 Mg twice daily x1 week followed by 200 mg once daily thereafter and hope that this will maintain her in sinus rhythm.  There is of course the  concern for worsening conduction given underlying interventricular conduction delay and advanced age.  Not on anticoagulation due to fall and bleeding risk.  Remains on home dose of aspirin 81 Mg daily and Toprol-XL 12.5 mg at bedtime.  I discussed with Dr. Marlou Porch, Cardiology earlier today and he has cleared her for discharge home.  Outpatient follow-up with Cardiology.  CAD s/p PCI No anginal symptoms.  S/p BMS to proximal RCA and circumflex artery in 2012.  Continue aspirin and Toprol-XL.  Essential hypertension/hypotension Hypotension has improved, most recent blood  pressures are within normal range.  Monitor closely.  Chronic hronic diastolic CHF Remains on Lasix 20 mg daily.  Clinically appears euvolemic.  Not sure if she had decompensation this admission.  Hyperlipidemia Not on statins PTA.  Elevated TSH TSH >11.  Clinically euthyroid.  Consider repeating TSH in 4 to 6 weeks or may consider checking free T3 and free T4 during upcoming office visit with PCP in a week's time.  Adult failure to thrive As per my discussion with patient's niece and daughter via phone on 4/30, they have been having extreme difficulty in caring for patient at home over the last 6 weeks.  They report patient has been in and out of SNF's a couple times.  Niece works and also recently had to travel out of town.  She had approximately 2 weeks of 24/7 assistance at home recently for which they had to pay out of pocket which drained their resources.  Neighbors and church folks have tried to assist but that also seems to have run out.  They were interested in SNF placement.  Requested PT, OT consult and SNF.    PT and OT recommend home health PT and OT.  TOC met with patient and had extensive discussion with patient and her daughter.  Patient is absolutely not interested in going to a SNF and wishes to be discharged home today.  This was communicated by [facility] with patient's daughter also and both are agreeable for discharge home at this time.  Patient has capacity to make medical decisions.  I also called and updated patient's niece with above and she expressed understanding.  TOC team has maximized home health services.  PCP can assist patient/family regarding placement to LTC facility or ALF if patient agreeable but currently does not appear to be.  Body mass index is 19.49 kg/m.     Consultants:   Cardiology  Procedures:   None    Discharge Instructions  Discharge Instructions    (HEART FAILURE PATIENTS) Call MD:  Anytime you have any of the following symptoms: 1) 3  pound weight gain in 24 hours or 5 pounds in 1 week 2) shortness of breath, with or without a dry hacking cough 3) swelling in the hands, feet or stomach 4) if you have to sleep on extra pillows at night in order to breathe.   Complete by: As directed    Amb referral to AFIB Clinic   Complete by: As directed    Call MD for:   Complete by: As directed    Heart racing or palpitations.   Call MD for:  difficulty breathing, headache or visual disturbances   Complete by: As directed    Call MD for:  extreme fatigue   Complete by: As directed    Call MD for:  persistant dizziness or light-headedness   Complete by: As directed    Call MD for:  severe uncontrolled pain   Complete by: As directed  Diet - low sodium heart healthy   Complete by: As directed    Increase activity slowly   Complete by: As directed        Medication List    STOP taking these medications   polyethylene glycol 17 g packet Commonly known as: MIRALAX / GLYCOLAX   polyethylene glycol powder 17 GM/SCOOP powder Commonly known as: GLYCOLAX/MIRALAX   Senna Plus 8.6-50 MG tablet Generic drug: senna-docusate   senna-docusate 8.6-50 MG tablet Commonly known as: Senokot-S     TAKE these medications   acetaminophen 500 MG tablet Commonly known as: TYLENOL Take 2 tablets (1,000 mg total) by mouth every 6 (six) hours as needed for mild pain. What changed:   how much to take  when to take this  reasons to take this   amiodarone 200 MG tablet Commonly known as: PACERONE 1 tab (200 mg total) by mouth twice daily for 1 week followed by 1 tab (200 mg total) by mouth daily thereafter. What changed:   how much to take  how to take this  when to take this  additional instructions   aspirin EC 81 MG tablet Take 81 mg by mouth every morning.   beta carotene w/minerals tablet Take 1 tablet by mouth every morning.   cholecalciferol 25 MCG (1000 UNIT) tablet Commonly known as: VITAMIN D3 Take 1,000 Units  by mouth every morning.   clonazePAM 0.5 MG tablet Commonly known as: KLONOPIN Take 0.25 mg by mouth at bedtime.   furosemide 40 MG tablet Commonly known as: LASIX Take 20 mg by mouth every morning.   melatonin 5 MG Tabs Take 5 mg by mouth at bedtime.   metoprolol succinate 25 MG 24 hr tablet Commonly known as: Toprol XL Take 0.5 tablets (12.5 mg total) by mouth at bedtime.   multivitamin with minerals Tabs tablet Take 1 tablet by mouth every morning.   nitroGLYCERIN 0.4 MG SL tablet Commonly known as: NITROSTAT Place 0.4 mg under the tongue every 5 (five) minutes as needed for chest pain.   Polyethyl Glycol-Propyl Glycol 0.4-0.3 % Gel ophthalmic gel Commonly known as: SYSTANE Place 1 drop into both eyes at bedtime.   sertraline 25 MG tablet Commonly known as: ZOLOFT Take 25 mg by mouth at bedtime.      Allergies  Allergen Reactions  . Effexor [Venlafaxine Hydrochloride] Nausea And Vomiting  . Penicillins Shortness Of Breath    Has patient had a PCN reaction causing immediate rash, facial/tongue/throat swelling, SOB or lightheadedness with hypotension: Yes Has patient had a PCN reaction causing severe rash involving mucus membranes or skin necrosis: No Has patient had a PCN reaction that required hospitalization: No Has patient had a PCN reaction occurring within the last 10 years: No If all of the above answers are "NO", then may proceed with Cephalosporin use.   . Sulfa Antibiotics Nausea Only  . Dicyclomine Other (See Comments)    Caused confusion  . Lactose Intolerance (Gi) Diarrhea  . Hydrocodone Nausea Only      Procedures/Studies:  DG Chest Portable 1 View  Result Date: 06/11/2020 CLINICAL DATA:  4 day history of fatigue. EXAM: PORTABLE CHEST 1 VIEW COMPARISON:  05/03/2020 FINDINGS: 1430 hours. Lungs are hyperexpanded. The cardio pericardial silhouette is enlarged. Valvular annular calcification over the left heart is probably mitral valve related.  Interstitial markings are diffusely coarsened with chronic features. The lungs are clear without focal pneumonia, edema, pneumothorax or pleural effusion. The visualized bony structures of the thorax show no  acute abnormality. Telemetry leads overlie the chest. IMPRESSION: Hyperexpansion without acute cardiopulmonary findings. Electronically Signed   By: Misty Stanley M.D.   On: 06/11/2020 15:01     Subjective: Feels much better.  No further palpitations.  Denies dyspnea.  Feels stronger.  Prefers to go home rather than SNF.  No chest pain  Discharge Exam:  Vitals:   06/12/20 0020 06/12/20 0413 06/12/20 0757 06/12/20 1123  BP: (!) 92/44 (!) 99/45 (!) 101/51 (!) 137/49  Pulse: 63 (!) 43 64 65  Resp: '15 20 17 18  ' Temp: 98 F (36.7 C) 98 F (36.7 C) 97.8 F (36.6 C) 98.5 F (36.9 C)  TempSrc: Axillary Oral Axillary Oral  SpO2: 100% 99% 97% 100%  Weight:  49.9 kg       General exam: Elderly female, small built and frail, lying comfortably propped up in bed without distress. Respiratory system: Clear to auscultation. Respiratory effort normal. Cardiovascular system: S1 & S2 heard, RRR. No JVD, murmurs, rubs, gallops or clicks. No pedal edema.  Telemetry personally reviewed: Sinus rhythm with BBB morphology.?  Second-degree AV block (reviewed with Dr. Marlou Porch). Gastrointestinal system: Abdomen is nondistended, soft and nontender. No organomegaly or masses felt. Normal bowel sounds heard. Central nervous system: Alert and oriented. No focal neurological deficits. Extremities: Symmetric 5 x 5 power. Skin: No rashes, lesions or ulcers.  Right hand/distal forearm bruising. Psychiatry: Judgement and insight appear impaired. Mood & affect appropriate.     The results of significant diagnostics from this hospitalization (including imaging, microbiology, ancillary and laboratory) are listed below for reference.     Microbiology: Recent Results (from the past 240 hour(s))  Resp Panel by  RT-PCR (Flu A&B, Covid) Nasopharyngeal Swab     Status: None   Collection Time: 06/11/20  2:17 PM   Specimen: Nasopharyngeal Swab; Nasopharyngeal(NP) swabs in vial transport medium  Result Value Ref Range Status   SARS Coronavirus 2 by RT PCR NEGATIVE NEGATIVE Final    Comment: (NOTE) SARS-CoV-2 target nucleic acids are NOT DETECTED.  The SARS-CoV-2 RNA is generally detectable in upper respiratory specimens during the acute phase of infection. The lowest concentration of SARS-CoV-2 viral copies this assay can detect is 138 copies/mL. A negative result does not preclude SARS-Cov-2 infection and should not be used as the sole basis for treatment or other patient management decisions. A negative result may occur with  improper specimen collection/handling, submission of specimen other than nasopharyngeal swab, presence of viral mutation(s) within the areas targeted by this assay, and inadequate number of viral copies(<138 copies/mL). A negative result must be combined with clinical observations, patient history, and epidemiological information. The expected result is Negative.  Fact Sheet for Patients:  EntrepreneurPulse.com.au  Fact Sheet for Healthcare Providers:  IncredibleEmployment.be  This test is no t yet approved or cleared by the [place] FDA and  has been authorized for detection and/or diagnosis of SARS-CoV-2 by FDA under an Emergency Use Authorization (EUA). This EUA will remain  in effect (meaning this test can be used) for the duration of the COVID-19 declaration under Section 564(b)(1) of the Act, 21 U.S.C.section 360bbb-3(b)(1), unless the authorization is terminated  or revoked sooner.       Influenza A by PCR NEGATIVE NEGATIVE Final   Influenza B by PCR NEGATIVE NEGATIVE Final    Comment: (NOTE) The Xpert Xpress SARS-CoV-2/FLU/RSV plus assay is intended as an aid in the diagnosis of influenza from Nasopharyngeal swab  specimens and should not be used as a sole  basis for treatment. Nasal washings and aspirates are unacceptable for Xpert Xpress SARS-CoV-2/FLU/RSV testing.  Fact Sheet for Patients: EntrepreneurPulse.com.au  Fact Sheet for Healthcare Providers: IncredibleEmployment.be  This test is not yet approved or cleared by the [place] FDA and has been authorized for detection and/or diagnosis of SARS-CoV-2 by FDA under an Emergency Use Authorization (EUA). This EUA will remain in effect (meaning this test can be used) for the duration of the COVID-19 declaration under Section 564(b)(1) of the Act, 21 U.S.C. section 360bbb-3(b)(1), unless the authorization is terminated or revoked.  Performed at [facility], [place] [address], [place], [place] [address]      Labs: CBC: Recent Labs  Lab 06/11/20 1355  WBC 8.2  NEUTROABS 5.1  HGB 13.9  HCT 44.3  MCV 95.1  PLT 707    Basic Metabolic Panel: Recent Labs  Lab 06/11/20 1355 06/12/20 0358  NA 135 138  K 4.5 4.7  CL 101 103  CO2 27 28  GLUCOSE 105* 92  BUN 24* 19  CREATININE 0.86 0.89  CALCIUM 9.8 9.2  MG 2.1  --     Liver Function Tests: Recent Labs  Lab 06/11/20 1355  AST 39  ALT 28  ALKPHOS 138*  BILITOT 0.5  PROT 7.9  ALBUMIN 3.7    Thyroid function studies Recent Labs    06/11/20 1355  TSH 11.118*     Urinalysis    Component Value Date/Time   COLORURINE YELLOW 06/11/2020 1736   APPEARANCEUR CLEAR 06/11/2020 1736   LABSPEC 1.011 06/11/2020 1736   PHURINE 5.0 06/11/2020 1736   GLUCOSEU NEGATIVE 06/11/2020 1736   HGBUR NEGATIVE 06/11/2020 1736   BILIRUBINUR NEGATIVE 06/11/2020 1736   KETONESUR NEGATIVE 06/11/2020 1736   PROTEINUR NEGATIVE 06/11/2020 1736   UROBILINOGEN 0.2 10/01/2014 1628   NITRITE NEGATIVE 06/11/2020 1736   LEUKOCYTESUR NEGATIVE 06/11/2020 1736    I discussed in detail with patient's daughter and niece in detail multiple times, updated  care and answered all questions.  Time coordinating discharge: 35 minutes  SIGNED:  Vernell Leep, MD, [place], [facility]. [facility]  To contact the attending provider between 7A-7P or the covering provider during after hours 7P-7A, please log into the web site [email or web address] and access using universal [place] password for that web site. If you do not have the password, please call the hospital operator.

## 2020-06-12 NOTE — Progress Notes (Signed)
Pt HR sustaining below 60 BPM; Olevia Bowens, MD made aware. Amio gtt rate changed and PO metoprolol evening dose held. Will continue to monitor.   Elaina Hoops, RN

## 2020-06-12 NOTE — Discharge Instructions (Signed)

## 2020-06-12 NOTE — Evaluation (Signed)
Occupational Therapy Evaluation Patient Details Name: [name] MRN: [id number] DOB: [birth year] Today's Date: 06/12/2020    History of Present Illness The pt is a 87 yo female presenting 4/29 from home with c/o SOB and weakness. Pt found to be in atrial flutter with HR of 134 at rest. PMH includes: CAD s/p STEMI (in 2012), 1st degree heart block, HLD, HTN, afib, and multiple joint replacements.   Clinical Impression   Pt admitted with the above diagnoses and presents with below problem list. Pt will benefit from continued acute OT to address the below listed deficits and maximize independence with basic ADLs prior to d/c home. At baseline, pt lives alone and completes most basic ADLs at mod I level, [facility] aide assists with bathing a few times/week. Pt currently needs up to min guard assist with functional transfers/mobility. No physical assist needed. Pt seems to have good network of support at home (neighbors get her groceries, [facility] aide a few times a week, etc) Feel she will be ok to d/c back home once medically cleared.      Follow Up Recommendations  Home health OT;Supervision - Intermittent (initial 24 hr supervision if possible)  Equipment Recommendations  None recommended by OT    Recommendations for Other Services       Precautions / Restrictions Precautions Precautions: Fall Restrictions Weight Bearing Restrictions: No      Mobility Bed Mobility Overal bed mobility: Modified Independent             General bed mobility comments: up in recliner at start and end of session    Transfers Overall transfer level: Needs assistance Equipment used: Rolling walker (2 wheeled) Transfers: Sit to/from Stand Sit to Stand: Min guard         General transfer comment: min guard for safety, slow to power up. to/from recliner seat height    Balance Overall balance assessment: Mild deficits observed, not formally tested                                          ADL either performed or assessed with clinical judgement   ADL Overall ADL's : Needs assistance/impaired Eating/Feeding: Set up;Sitting   Grooming: Set up;Sitting   Upper Body Bathing: Set up;Sitting   Lower Body Bathing: Min guard;Sit to/from stand   Upper Body Dressing : Set up;Sitting   Lower Body Dressing: Min guard;Sit to/from stand   Toilet Transfer: Min guard;Ambulation;RW;Regular Toilet   Toileting- Clothing Manipulation and Hygiene: Set up;Min guard;Sitting/lateral lean;Sit to/from stand   Tub/ Shower Transfer: Minimal assistance;Ambulation;Shower seat;Rolling walker;Min guard   Functional mobility during ADLs: Min guard;Rolling walker General ADL Comments: Pt appears to be close to her baseline with basic ADLs. min guard for safety. 1 LOB turning in [place], pt able to self correct.     Vision Baseline Vision/History: Wears glasses       Perception     Praxis      Pertinent Vitals/Pain Pain Assessment: No/denies pain     Hand Dominance Right   Extremity/Trunk Assessment Upper Extremity Assessment Upper Extremity Assessment: Generalized weakness   Lower Extremity Assessment Lower Extremity Assessment: Generalized weakness;Defer to PT evaluation   Cervical / Trunk Assessment Cervical / Trunk Assessment: Kyphotic   Communication Communication Communication: HOH (B hearing aides)   Cognition Arousal/Alertness: Awake/alert Behavior During Therapy: WFL for tasks assessed/performed Overall Cognitive Status: Within Functional Limits  for tasks assessed                                     General Comments  VSS on RA. One brief episode of O2 dropping to 90 while preparing to sit; suspect pt was holding her breath as O2 level quickly recovered.    Exercises     Shoulder Instructions      Home Living Family/patient expects to be discharged to:: Private residence Living Arrangements: Alone Available Help at Discharge:  Family;Neighbor;Available PRN/intermittently Type of Home: House Home Access: Ramped entrance;Stairs to enter     Home Layout: One level     Bathroom Shower/Tub: Occupational psychologist: Handicapped height Bathroom Accessibility: Yes   Home Equipment: Cane - single point;Grab bars - tub/shower;Shower seat - built in;Walker - 2 wheels;Adaptive equipment;Grab bars - toilet   Additional Comments: showers 1-2x/wk with aide      Prior Functioning/Environment Level of Independence: Independent with assistive device(s)        Comments: Uses RW for ambulation, Does not drive. 1 partial fall in last 6 months. Loves to bake/cook. Neighbor gets her groceries        OT Problem List: Decreased strength;Decreased activity tolerance;Impaired balance (sitting and/or standing);Decreased knowledge of use of DME or AE;Decreased knowledge of precautions      OT Treatment/Interventions:      OT Goals(Current goals can be found in the care plan section) Acute Rehab OT Goals Patient Stated Goal: to get home for 89nd birthday party OT Goal Formulation: With patient Time For Goal Achievement: 06/26/20 Potential to Achieve Goals: Good ADL Goals Pt Will Perform Grooming: with modified independence;sitting;standing Pt Will Perform Upper Body Dressing: with modified independence;sitting Pt Will Perform Lower Body Dressing: with modified independence;sit to/from stand Pt Will Transfer to Toilet: with modified independence;ambulating Pt Will Perform Toileting - Clothing Manipulation and hygiene: with modified independence;sit to/from stand  OT Frequency:     Barriers to D/C:            Co-evaluation              AM-PAC OT "6 Clicks" Daily Activity     Outcome Measure Help from another person eating meals?: None Help from another person taking care of personal grooming?: None Help from another person toileting, which includes using toliet, bedpan, or urinal?: A Little Help  from another person bathing (including washing, rinsing, drying)?: A Little Help from another person to put on and taking off regular upper body clothing?: None Help from another person to put on and taking off regular lower body clothing?: A Little 6 Click Score: 21   End of Session Equipment Utilized During Treatment: Rolling walker  Activity Tolerance: Patient tolerated treatment well Patient left: in chair;with call bell/phone within reach  OT Visit Diagnosis: Unsteadiness on feet (R26.81);Muscle weakness (generalized) (M62.81)                Time: 2831-5176 OT Time Calculation (min): 16 min Charges:  OT General Charges $OT Visit: 1 Visit OT Evaluation $OT Eval Low Complexity: [place], OT Acute Rehabilitation Services Pager: [phone] Office: [phone]   [name] 06/12/2020, 2:27 PM

## 2020-06-12 NOTE — TOC Transition Note (Addendum)
Transition of Care [facility]) - CM/SW Discharge Note   Patient Details  Name: [name] MRN: [id number] Date of Birth: April 13, 1916  Transition of Care [facility]) CM/SW Contact:  Carles Collet, RN Phone Number: 06/12/2020, 4:27 PM   Clinical Narrative:   Damaris Schooner w patient and daughter at bedside. Patient states that she wants to go home today. She states that she will get weaker the longer she stays in the hospital and wants to go home and celebrate her 89nd birthday. She said she is "just a little weak" but will be back to normal once she gets home and can move around.  She lives at home alone, has home health services through [place] and identified neighbors and family as caregivers. Her home is one level and handicapped modified. Her daughter at bedside confirms that her niece and her split the week and provide supervision. The daughter shared feelings overwhelm and that she needs foot surgery and has been postponing it due to needing to assist with her mom.  We discussed skilled vs custodial care, identified that the patient would be custodial at a facility and per MD is optimized for discharge. Explained that there is not anything else we would be doing at the hospital. This made the patient excited to go home, but caused apprehension for the daughter.  Discussed with MD. [facility] services. Patient has RW, no DME needs. Alvis Lemmings stated that there was concern that they would have to drop services as PCP was not signing [place] orders. Discussed with Dr Sarajane Jews (director on call), who is wanting to help and will speak with consulting MDs to assist with signing, however they were not agreeable. Final recommendation from Dr Sarajane Jews is to have PCP sign.  Updated [facility], he will follow up with PCP on Monday.   [facility] IS able to accept for resumption of services at this time.      Final next level of care: Home w Home Health Services Barriers to Discharge: No Barriers Identified   Patient  Goals and CMS Choice Patient states their goals for this hospitalization and ongoing recovery are:: to go home to celebrate 35 birthday   Choice offered to / list presented to : Patient  Discharge Placement                       Discharge Plan and Services                DME Arranged: N/A         HH Arranged: RN,PT,OT,Nurse's Aide,Social Work [employer] Agency: [place] Date [facility] Agency Contacted: 06/12/20 Time [place]: [id number] Representative spoke with at [place]: [place] ([place]) Interventions     Readmission Risk Interventions Readmission Risk Prevention Plan 05/04/2020  Transportation Screening Complete  PCP or Specialist Appt within 5-7 Days Complete  Home Care Screening Complete  Medication Review (RN CM) Complete  Some recent data might be hidden

## 2020-06-12 NOTE — Progress Notes (Signed)
PROGRESS NOTE   [name]  WUJ:[id number]    DOB: [birth year]    DOA: 06/11/2020  PCP: Joyce Downing, MD   I have briefly reviewed patients previous medical records in [facility].  Chief Complaint  Patient presents with  . Shortness of Breath    Brief Narrative:  87 year old female patient, lives alone, ambulates with the help of a walker, medical history significant for but not limited to CAD s/p PCI, chronic diastolic CHF, PAF with history of RVR, HTN, HLD, several admissions and ED visits for PAF with RVR, presented to ED with increased weakness and dyspnea.  She was admitted for A. fib with RVR, developed hypotension with Cardizem infusion, did not respond to digoxin, subsequently started on IV amiodarone infusion without bolus and reverted to sinus rhythm, transitioned to oral amiodarone.  Cardiology consulting.  Medically optimized for DC 4/30 but per family, unable to care for her at home, [name] consulted for possible SNF placement.   Assessment & Plan:  Active Problems:   CAD (coronary artery disease)   Hyperlipidemia   HTN (hypertension)   Paroxysmal atrial fibrillation with rapid ventricular response (HCC)   Acute on chronic diastolic CHF (congestive heart failure) (HCC)   Other cirrhosis of liver (HCC)   Atrial fibrillation with RVR (HCC)   Atrial fibrillation with RVR Sees Dr. Angelena Hamilton, Cardiology as outpatient.  Was on amiodarone 100 mg daily and Toprol-XL 12.5 mg at bedtime, PTA.  Presented with symptomatic RVR in the 30s.  Developed hypotension with IV Cardizem, discontinued.  Did not respond to a single dose of digoxin 125 mg IV in ED.  Started on IV amiodarone drip without bolus, overnight drip rate had to be reduced to 2 bradycardia.  Patient reverted to sinus rhythm.  Cardiology follow-up appreciated, have transitioned her to amiodarone 200 Mg twice daily x1 week followed by 200 mg once daily thereafter and hope that this will maintain her in sinus  rhythm.  There is of course the concern for worsening conduction given underlying interventricular conduction delay and advanced age.  Not on anticoagulation due to fall and bleeding risk.  Remains on home dose of aspirin 81 Mg daily and Toprol-XL 12.5 mg at bedtime.  CAD s/p PCI No anginal symptoms.  S/p BMS to proximal RCA and circumflex artery in 2012.  Continue aspirin and Toprol-XL.  Essential hypertension/hypotension Hypotension has improved, most recent blood pressures are within normal range.  Monitor closely.  Chronic hronic diastolic CHF Remains on Lasix 20 mg daily.  Clinically appears euvolemic.  Not sure if she had decompensation this admission.  Hyperlipidemia Not on statins PTA.  Adult failure to thrive As per my discussion with patient's niece and daughter via phone on 4/30, they have been having extreme difficulty in caring for patient at home over the last 6 weeks.  They report patient has been in and out of SNF's a couple times.  Niece works and also recently had to travel out of town.  She had approximately 2 weeks of 24/7 assistance at home recently for which they had to pay out of pocket and drain their resources.  Neighbors and church folks have tried to assist but that also seems to have run out.  They are interested in SNF placement.  Requested PT, OT consult and SNF.  Pending PT consult, at this time she may be more appropriate for LTC facility and I have discussed this in detail with family who verbalized understanding.  Niece indicates that they  are considering applying for Medicaid  Body mass index is 19.49 kg/m.   DVT prophylaxis: heparin injection 5,000 Units Start: 06/11/20 1830     Code Status: DNR Family Communication: Discussed in detail with patient's daughter and niece separately via phone on 4/30. Disposition:  Status is: Inpatient  Not inpatient appropriate, will call UM team and downgrade to OBS.   Dispo: The patient is from: Home               Anticipated d/c is to: SNF              Patient currently is medically stable to d/c.   Difficult to place patient No        Consultants:   Cardiology  Procedures:   None  Antimicrobials:    Anti-infectives (From admission, onward)   None        Subjective:  Feels much better.  No further palpitations.  Denies dyspnea.  Feels stronger.  Prefers to go home rather than SNF.  No chest pain  Objective:   Vitals:   06/12/20 0020 06/12/20 0413 06/12/20 0757 06/12/20 1123  BP: (!) 92/44 (!) 99/45 (!) 101/51 (!) 137/49  Pulse: 63 (!) 43 64 65  Resp: 15 20 17 18   Temp: 98 F (36.7 C) 98 F (36.7 C) 97.8 F (36.6 C) 98.5 F (36.9 C)  TempSrc: Axillary Oral Axillary Oral  SpO2: 100% 99% 97% 100%  Weight:  49.9 kg      General exam: Elderly female, small built and frail, lying comfortably propped up in bed without distress. Respiratory system: Clear to auscultation. Respiratory effort normal. Cardiovascular system: S1 & S2 heard, RRR. No JVD, murmurs, rubs, gallops or clicks. No pedal edema.  Telemetry personally reviewed: Sinus rhythm with BBB morphology.?  Second-degree AV block (reviewed with Dr. Marlou Porch). Gastrointestinal system: Abdomen is nondistended, soft and nontender. No organomegaly or masses felt. Normal bowel sounds heard. Central nervous system: Alert and oriented. No focal neurological deficits. Extremities: Symmetric 5 x 5 power. Skin: No rashes, lesions or ulcers.  Right hand/distal forearm bruising. Psychiatry: Judgement and insight appear impaired. Mood & affect appropriate.     Data Reviewed:   I have personally reviewed following labs and imaging studies   CBC: Recent Labs  Lab 06/11/20 1355  WBC 8.2  NEUTROABS 5.1  HGB 13.9  HCT 44.3  MCV 95.1  PLT 818    Basic Metabolic Panel: Recent Labs  Lab 06/11/20 1355 06/12/20 0358  NA 135 138  K 4.5 4.7  CL 101 103  CO2 27 28  GLUCOSE 105* 92  BUN 24* 19  CREATININE 0.86 0.89  CALCIUM  9.8 9.2  MG 2.1  --     Liver Function Tests: Recent Labs  Lab 06/11/20 1355  AST 39  ALT 28  ALKPHOS 138*  BILITOT 0.5  PROT 7.9  ALBUMIN 3.7    CBG: No results for input(s): GLUCAP in the last 168 hours.  Microbiology Studies:   Recent Results (from the past 240 hour(s))  Resp Panel by RT-PCR (Flu A&B, Covid) Nasopharyngeal Swab     Status: None   Collection Time: 06/11/20  2:17 PM   Specimen: Nasopharyngeal Swab; Nasopharyngeal(NP) swabs in vial transport medium  Result Value Ref Range Status   SARS Coronavirus 2 by RT PCR NEGATIVE NEGATIVE Final    Comment: (NOTE) SARS-CoV-2 target nucleic acids are NOT DETECTED.  The SARS-CoV-2 RNA is generally detectable in upper respiratory specimens during the acute phase  of infection. The lowest concentration of SARS-CoV-2 viral copies this assay can detect is 138 copies/mL. A negative result does not preclude SARS-Cov-2 infection and should not be used as the sole basis for treatment or other patient management decisions. A negative result may occur with  improper specimen collection/handling, submission of specimen other than nasopharyngeal swab, presence of viral mutation(s) within the areas targeted by this assay, and inadequate number of viral copies(<138 copies/mL). A negative result must be combined with clinical observations, patient history, and epidemiological information. The expected result is Negative.  Fact Sheet for Patients:  EntrepreneurPulse.com.au  Fact Sheet for Healthcare Providers:  IncredibleEmployment.be  This test is no t yet approved or cleared by the [place] FDA and  has been authorized for detection and/or diagnosis of SARS-CoV-2 by FDA under an Emergency Use Authorization (EUA). This EUA will remain  in effect (meaning this test can be used) for the duration of the COVID-19 declaration under Section 564(b)(1) of the Act, 21 U.S.C.section  360bbb-3(b)(1), unless the authorization is terminated  or revoked sooner.       Influenza A by PCR NEGATIVE NEGATIVE Final   Influenza B by PCR NEGATIVE NEGATIVE Final    Comment: (NOTE) The Xpert Xpress SARS-CoV-2/FLU/RSV plus assay is intended as an aid in the diagnosis of influenza from Nasopharyngeal swab specimens and should not be used as a sole basis for treatment. Nasal washings and aspirates are unacceptable for Xpert Xpress SARS-CoV-2/FLU/RSV testing.  Fact Sheet for Patients: EntrepreneurPulse.com.au  Fact Sheet for Healthcare Providers: IncredibleEmployment.be  This test is not yet approved or cleared by the [place] FDA and has been authorized for detection and/or diagnosis of SARS-CoV-2 by FDA under an Emergency Use Authorization (EUA). This EUA will remain in effect (meaning this test can be used) for the duration of the COVID-19 declaration under Section 564(b)(1) of the Act, 21 U.S.C. section 360bbb-3(b)(1), unless the authorization is terminated or revoked.  Performed at [facility], [place] [address], [place], [place] [address]      Radiology Studies:  DG Chest Portable 1 View  Result Date: 06/11/2020 CLINICAL DATA:  4 day history of fatigue. EXAM: PORTABLE CHEST 1 VIEW COMPARISON:  05/03/2020 FINDINGS: 1430 hours. Lungs are hyperexpanded. The cardio pericardial silhouette is enlarged. Valvular annular calcification over the left heart is probably mitral valve related. Interstitial markings are diffusely coarsened with chronic features. The lungs are clear without focal pneumonia, edema, pneumothorax or pleural effusion. The visualized bony structures of the thorax show no acute abnormality. Telemetry leads overlie the chest. IMPRESSION: Hyperexpansion without acute cardiopulmonary findings. Electronically Signed   By: Misty Stanley M.D.   On: 06/11/2020 15:01     Scheduled Meds:   . amiodarone  200 mg Oral BID    Followed by  . [START ON 06/19/2020] amiodarone  200 mg Oral Daily  . aspirin EC  81 mg Oral q morning  . clonazePAM  0.25 mg Oral QHS  . furosemide  20 mg Oral q morning  . heparin  5,000 Units Subcutaneous Q8H  . melatonin  5 mg Oral QHS  . metoprolol succinate  12.5 mg Oral QHS  . polyvinyl alcohol   Both Eyes QHS  . sertraline  25 mg Oral QHS    Continuous Infusions:     LOS: 1 day     Vernell Leep, MD, [place], [facility]. [facility]    To contact the attending provider between 7A-7P or the covering provider during after hours 7P-7A,  please log into the web site [email or web address] and access using universal [place] password for that web site. If you do not have the password, please call the hospital operator.  06/12/2020, 12:42 PM

## 2020-06-12 NOTE — Evaluation (Signed)
Physical Therapy Evaluation Patient Details Name: [name] MRN: [id number] DOB: [birth year] Today's Date: 06/12/2020   History of Present Illness  The pt is a 87 yo female presenting 4/29 from home with c/o SOB and weakness. Pt found to be in atrial flutter with HR of 134 at rest. PMH includes: CAD s/p STEMI (in 2012), 1st degree heart block, HLD, HTN, afib, and multiple joint replacements.    Clinical Impression  Pt in bed upon arrival of PT, agreeable to evaluation at this time. Prior to admission the pt was independent with mobility at home (where she lives alone) when using a RW, but does report an aide comes a few times each week to assist with showers. The pt now presents with limitations in functional mobility, strength, power, endurance, and dynamic stability due to above dx, and will continue to benefit from skilled PT to address these deficits, but is safe to return home with family support when medically cleared. The pt was able to complete bed mobility and initial transfers with minG for safety, but no physical assist needed at this time. The pt was then able to complete hallway ambulation with use of RW and RA only with SpO2 maintaining 92-97% on RA with gait. The pt reports she feels slightly weaker than her normal, but is moving close to her baseline. The pt is eager to return home for her 89nd birthday, and will be safe to return home with current equipment, family supervision, and HHPT to facilitate return to independence and prior endurance with mobility.       Follow Up Recommendations Home health PT;Supervision for mobility/OOB    Equipment Recommendations  None recommended by PT (pt has needed equipment)    Recommendations for Other Services       Precautions / Restrictions Precautions Precautions: Fall Restrictions Weight Bearing Restrictions: No      Mobility  Bed Mobility Overal bed mobility: Modified Independent             General bed mobility  comments: pt completed without assist, increased time and HOB elevated    Transfers Overall transfer level: Needs assistance Equipment used: Rolling walker (2 wheeled) Transfers: Sit to/from Stand Sit to Stand: Min guard         General transfer comment: sit-stand without assist, slow to power up  Ambulation/Gait Ambulation/Gait assistance: Min guard Gait Distance (Feet): 45 Feet Assistive device: Rolling walker (2 wheeled) Gait Pattern/deviations: Step-through pattern;Decreased stride length;Trunk flexed Gait velocity: 0.25 m/s Gait velocity interpretation: <1.31 ft/sec, indicative of household ambulator General Gait Details: pt with significant trunk flexion, despite cues for upright posture. no LOB with BUE support.      Balance Overall balance assessment: Mild deficits observed, not formally tested                                           Pertinent Vitals/Pain Pain Assessment: No/denies pain    Home Living Family/patient expects to be discharged to:: Private residence Living Arrangements: Alone Available Help at Discharge: Family;Neighbor;Available PRN/intermittently Type of Home: House Home Access: Ramped entrance;Stairs to enter     Home Layout: One level Home Equipment: Cane - single point;Grab bars - tub/shower;Shower seat - built in;Walker - 2 wheels;Adaptive equipment;Grab bars - toilet Additional Comments: showers 1-2x/wk with aide    Prior Function Level of Independence: Independent with assistive device(s)  Comments: Uses RW for ambulation, Does not drive. 1 partial fall in last 6 months. Loves to bake/cook.     Hand Dominance   Dominant Hand: Right    Extremity/Trunk Assessment   Upper Extremity Assessment Upper Extremity Assessment: Generalized weakness    Lower Extremity Assessment Lower Extremity Assessment: Generalized weakness    Cervical / Trunk Assessment Cervical / Trunk Assessment: Kyphotic   Communication   Communication: HOH (with hearing aides)  Cognition Arousal/Alertness: Awake/alert Behavior During Therapy: WFL for tasks assessed/performed Overall Cognitive Status: Within Functional Limits for tasks assessed                                        General Comments General comments (skin integrity, edema, etc.): VSS on RA, pt on 3L O2 upon PT arrival, Spo2 99-100% at rest, 95-100% with ambulation.    Exercises     Assessment/Plan    PT Assessment Patient needs continued PT services  PT Problem List Decreased strength;Decreased range of motion;Decreased activity tolerance;Decreased balance;Decreased mobility;Cardiopulmonary status limiting activity       PT Treatment Interventions DME instruction;Gait training;Stair training;Functional mobility training;Therapeutic activities;Therapeutic exercise;Neuromuscular re-education;Balance training;Patient/family education    PT Goals (Current goals can be found in the Care Plan section)  Acute Rehab PT Goals Patient Stated Goal: to ge home for 89nd birthday party PT Goal Formulation: With patient Time For Goal Achievement: 06/26/20 Potential to Achieve Goals: Good    Frequency Min 3X/week    AM-PAC PT "6 Clicks" Mobility  Outcome Measure Help needed turning from your back to your side while in a flat bed without using bedrails?: A Little Help needed moving from lying on your back to sitting on the side of a flat bed without using bedrails?: A Little Help needed moving to and from a bed to a chair (including a wheelchair)?: A Little Help needed standing up from a chair using your arms (e.g., wheelchair or bedside chair)?: A Little Help needed to walk in hospital room?: A Little Help needed climbing 3-5 steps with a railing? : A Lot 6 Click Score: 17    End of Session Equipment Utilized During Treatment: Gait belt Activity Tolerance: Patient tolerated treatment well;Patient limited by  fatigue Patient left: in chair;with call bell/phone within reach Nurse Communication: Mobility status (removal of O2) PT Visit Diagnosis: Other abnormalities of gait and mobility (R26.89);Muscle weakness (generalized) (M62.81)    Time: 9678-9381 PT Time Calculation (min) (ACUTE ONLY): 23 min   Charges:   PT Evaluation $PT Eval Low Complexity: 1 Low PT Treatments $Gait Training: 8-22 mins        Karma Ganja, PT, DPT   Acute Rehabilitation Department Pager #: [phone]  Otho Bellows 06/12/2020, 12:36 PM

## 2020-06-12 NOTE — Care Management Obs Status (Signed)
[place] NOTIFICATION   Patient Details  Name: [name] MRN: [id number] Date of Birth: 05/05/1916   Medicare Observation Status Notification Given:  Yes    Carles Collet, RN 06/12/2020, 4:01 PM

## 2020-06-12 NOTE — Care Management CC44 (Signed)
Condition Code 44 Documentation Completed  Patient Details  Name: SYBOL MORRE MRN: [id number] Date of Birth: Mar 29, 1916   Condition Code 58 given:  Yes Patient signature on Condition Code 44 notice:  Yes Documentation of 2 MD's agreement:  Yes Code 44 added to claim:  Yes    Carles Collet, RN 06/12/2020, 4:01 PM

## 2020-06-12 NOTE — Progress Notes (Signed)
Progress Note  Patient Name: [name] Date of Encounter: 06/12/2020  [facility] Cardiologist: Lauree Chandler, MD   Subjective   No chest pain no shortness of breath.  Heart rate did decrease nicely yesterday evening/last night.  Inpatient Medications    Scheduled Meds: . aspirin EC  81 mg Oral q morning  . clonazePAM  0.25 mg Oral QHS  . furosemide  20 mg Oral q morning  . heparin  5,000 Units Subcutaneous Q8H  . melatonin  5 mg Oral QHS  . metoprolol succinate  12.5 mg Oral QHS  . polyvinyl alcohol   Both Eyes QHS  . sertraline  25 mg Oral QHS   Continuous Infusions: . amiodarone 30 mg/hr (06/11/20 2321)   PRN Meds: acetaminophen, nitroGLYCERIN, ondansetron (ZOFRAN) IV, senna-docusate   Vital Signs    Vitals:   06/11/20 2324 06/12/20 0020 06/12/20 0413 06/12/20 0757  BP: (!) 110/51 (!) 92/44 (!) 99/45 (!) 101/51  Pulse: 64 63 (!) 43 64  Resp: 20 15 20 17   Temp: 98.1 F (36.7 C) 98 F (36.7 C) 98 F (36.7 C) 97.8 F (36.6 C)  TempSrc: Oral Axillary Oral Axillary  SpO2: 100% 100% 99% 97%  Weight:   49.9 kg     Intake/Output Summary (Last 24 hours) at 06/12/2020 1020 Last data filed at 06/12/2020 0417 Gross per 24 hour  Intake 500.87 ml  Output 250 ml  Net 250.87 ml   Last 3 Weights 06/12/2020 05/04/2020 04/27/2020  Weight (lbs) 110 lb 0.2 oz 92 lb 14.4 oz 103 lb 3.2 oz  Weight (kg) 49.9 kg 42.139 kg 46.811 kg      Telemetry    Currently sinus rhythm with first-degree AV block heart rate 60- Personally Reviewed  ECG    Sinus rhythm with PACs, first-degree AV block- Personally Reviewed  Physical Exam   GEN:  Elderly in no distress Neck: No JVD Cardiac: RRR, no murmurs, rubs, or gallops.  Respiratory: Clear to auscultation bilaterally. GI: Soft, nontender, non-distended  MS: No edema; No deformity. Neuro:  Nonfocal  Psych: Normal affect   Labs    High Sensitivity Troponin:   Recent Labs  Lab 06/11/20 1355 06/11/20 1558   TROPONINIHS 14 23*      Chemistry Recent Labs  Lab 06/11/20 1355 06/12/20 0358  NA 135 138  K 4.5 4.7  CL 101 103  CO2 27 28  GLUCOSE 105* 92  BUN 24* 19  CREATININE 0.86 0.89  CALCIUM 9.8 9.2  PROT 7.9  --   ALBUMIN 3.7  --   AST 39  --   ALT 28  --   ALKPHOS 138*  --   BILITOT 0.5  --   GFRNONAA 60* 57*  ANIONGAP 7 7     Hematology Recent Labs  Lab 06/11/20 1355  WBC 8.2  RBC 4.66  HGB 13.9  HCT 44.3  MCV 95.1  MCH 29.8  MCHC 31.4  RDW 14.1  PLT 245    BNP Recent Labs  Lab 06/11/20 1355  BNP 266.2*     DDimer No results for input(s): DDIMER in the last 168 hours.   Radiology    DG Chest Portable 1 View  Result Date: 06/11/2020 CLINICAL DATA:  4 day history of fatigue. EXAM: PORTABLE CHEST 1 VIEW COMPARISON:  05/03/2020 FINDINGS: 1430 hours. Lungs are hyperexpanded. The cardio pericardial silhouette is enlarged. Valvular annular calcification over the left heart is probably mitral valve related. Interstitial markings are diffusely coarsened with  chronic features. The lungs are clear without focal pneumonia, edema, pneumothorax or pleural effusion. The visualized bony structures of the thorax show no acute abnormality. Telemetry leads overlie the chest. IMPRESSION: Hyperexpansion without acute cardiopulmonary findings. Electronically Signed   By: Misty Stanley M.D.   On: 06/11/2020 15:01    Cardiac Studies     1. Left ventricular ejection fraction, by estimation, is 60 to 65%. The  left ventricle has normal function. The left ventricle has no regional  wall motion abnormalities. There is mild concentric left ventricular  hypertrophy. Left ventricular diastolic  function could not be evaluated. There is the interventricular septum is  flattened in systole and diastole, consistent with right ventricular  pressure and volume overload.  2. Right ventricular systolic function is normal. The right ventricular  size is mildly enlarged. There is mildly  elevated pulmonary artery  systolic pressure. The estimated right ventricular systolic pressure is  57.8 mmHg.  3. Left atrial size was moderately dilated.  4. Right atrial size was severely dilated.  5. The mitral valve is normal in structure. Moderate mitral valve  regurgitation. No evidence of mitral stenosis. Severe mitral annular  calcification.  6. Tricuspid valve regurgitation is severe.  7. The aortic valve is normal in structure. There is mild calcification  of the aortic valve. There is mild thickening of the aortic valve. Aortic  valve regurgitation is mild to moderate. Mild to moderate aortic valve  sclerosis/calcification is present,  without any evidence of aortic stenosis.  8. The inferior vena cava is dilated in size with <50% respiratory  variability, suggesting right atrial pressure of 15 mmHg.  9. Evidence of atrial level shunting detected by color flow Doppler.  There is a small secundum atrial septal defect with predominantly left to  right shunting across the atrial septum.   Patient Profile     87 y.o. female with atrial fibrillation rapid ventricular response, coronary artery disease hypotension moderate aortic regurgitation and fatigue and dyspnea  Assessment & Plan    Atrial fibrillation with rapid ventricular response - Was taking amiodarone 100 mg/day at home.  Came in with rapid ventricular response in the 130s.  Her last clinic EKG did show sinus rhythm. - She was given a single dose of digoxin 125 mcg IV in the ER.  She was started on IV amiodarone drip to help reload and adequately controlled rate.  This was successful overnight.  Her rate was decreased from 60 down to 30 mg/h. -Currently she is in sinus rhythm with first-degree AV block.  Heart rate is 60. -I will stop her IV amiodarone drip and place her on p.o. amiodarone 200 mg twice a day for 1 week and then 200 mg p.o. amiodarone thereafter.  Hopefully this will help continue to maintain  sinus rhythm.  Of course, I am concerned about worsening conduction given her underlying interventricular conduction delay and advanced age. -Normal ejection fraction on echocardiogram.  Coronary artery disease - No anginal symptoms stable.  Prior inferior STEMI in 2012 with total occlusion of proximal RCA treated with bare-metal stent as well as bare-metal stent to circumflex artery.  Hypotension - Somewhat improved.  Being careful.  Does not tolerate diltiazem.  Fatigue and dyspnea - In review of clinic notes, seems to be fairly longstanding complaint however she is very pleasant and is active is possible.  She has a birthday on Tuesday she will be 89.  Her family are going to try to celebrate tomorrow on Sunday.  Her  pastor is with her currently.  She does have someone at her house all of the time apparently. Hopefully she will be able to be discharged soon.  For questions or updates, please contact [place] Please consult [email or web address] for contact info under        Signed, Candee Furbish, MD  06/12/2020, 10:20 AM

## 2020-06-13 LAB — URINE CULTURE: Culture: <10000 — AB

## 2020-06-15 NOTE — Progress Notes (Signed)
Follow up call with PCP  I spoke to Dr. Claris Gower just now and requested him to sign this patient's home health orders going forward and he said he would.   Vernell Leep, MD, [place], [facility]. [facility]  To contact the attending provider between 7A-7P or the covering provider during after hours 7P-7A, please log into the web site [email or web address] and access using universal [place] password for that web site. If you do not have the password, please call the hospital operator.

## 2020-06-16 ENCOUNTER — Telehealth: Payer: Self-pay | Admitting: Cardiovascular Disease

## 2020-06-16 NOTE — Telephone Encounter (Signed)
RN spoke to [place], Marine scientist with [facility]. Glenard Haring was calling to ensure Dr. Angelena Form was aware of a change made to patients Amiodarone during her recent hospitalization. Amiodarone was increased to 200mg  daily for 7 days, and then the patient will resume her previous dose of 100mg  daily thereafter. Glenard Haring reports patients HR was 52 and BP was 112/52 during her visit today. Patient denies any chest pain, lightheadedness, or dizziness during today's visit. RN advised I would send a message to [place] and his nurse to update.

## 2020-06-16 NOTE — Telephone Encounter (Signed)
Pt c/o medication issue:  1. Name of Medication:   amiodarone (PACERONE) 200 MG tablet   2. How are you currently taking this medication (dosage and times per day)? 1 tablet by mouth 2x daily for 7 days   3. Are you having a reaction (difficulty breathing--STAT)? No   4. What is your medication issue? [name] is calling to notify [name] that this medication was adjusted while [name] was hospitalized. As of 06/12/20 she is taking it twice daily for 7 days then will go back to 1 tablet daily as of 05/20/20. This change was made due to her being in Afib while hospitalized. [name] reports she was not in Afib during her visit today.     Pt c/o BP issue: STAT if pt c/o blurred vision, one-sided weakness or slurred speech  1. What are your last 5 BP readings?  112/52 HR 52 today HR 57 Before she left (lowest BP reading during visits so far) 110/62 yesterday   2. Are you having any other symptoms (ex. Dizziness, headache, blurred vision, passed out)? No   3. What is your BP issue? Wanted to notify office also called PCP.   [name] is requesting a verbal order for 2 more weeks worth of visits. Visiting her home once a week. Please advise.

## 2020-06-17 NOTE — Telephone Encounter (Signed)
Thank you Chris.

## 2020-06-25 ENCOUNTER — Telehealth: Payer: Self-pay | Admitting: Cardiovascular Disease

## 2020-06-25 ENCOUNTER — Other Ambulatory Visit: Payer: Self-pay

## 2020-06-25 MED ORDER — AMIODARONE HCL 200 MG PO TABS: ORAL_TABLET | ORAL | 3 refills | Status: AC

## 2020-06-25 NOTE — Telephone Encounter (Signed)
Pt c/o medication issue:  1. Name of Medication: amiodarone (PACERONE) 200 MG tablet  2. How are you currently taking this medication (dosage and times per day)?  As written  3. Are you having a reaction (difficulty breathing--STAT)? No   4. What is your medication issue? Patient is currently out of medication. Needs new prescription asap so patient won't skip dosage

## 2020-06-25 NOTE — Telephone Encounter (Signed)
Prescription sent to pharmacy on file. 

## 2020-06-29 ENCOUNTER — Observation Stay (HOSPITAL_COMMUNITY)
Admission: EM | Admit: 2020-06-29 | Discharge: 2020-07-02 | Disposition: A | Discharge status: Home / Self Care | Payer: Medicare PPO | Attending: Internal Medicine | Admitting: Internal Medicine

## 2020-06-29 ENCOUNTER — Emergency Department (HOSPITAL_COMMUNITY): Payer: Medicare PPO

## 2020-06-29 DIAGNOSIS — R2689 Other abnormalities of gait and mobility: Secondary | ICD-10-CM | POA: Diagnosis not present

## 2020-06-29 DIAGNOSIS — I480 Paroxysmal atrial fibrillation: Secondary | ICD-10-CM | POA: Insufficient documentation

## 2020-06-29 DIAGNOSIS — N179 Acute kidney failure, unspecified: Secondary | ICD-10-CM | POA: Diagnosis not present

## 2020-06-29 DIAGNOSIS — I2510 Atherosclerotic heart disease of native coronary artery without angina pectoris: Secondary | ICD-10-CM | POA: Diagnosis not present

## 2020-06-29 DIAGNOSIS — I5032 Chronic diastolic (congestive) heart failure: Secondary | ICD-10-CM | POA: Diagnosis not present

## 2020-06-29 DIAGNOSIS — Z79899 Other long term (current) drug therapy: Secondary | ICD-10-CM | POA: Insufficient documentation

## 2020-06-29 DIAGNOSIS — Z7982 Long term (current) use of aspirin: Secondary | ICD-10-CM | POA: Diagnosis not present

## 2020-06-29 DIAGNOSIS — Z20822 Contact with and (suspected) exposure to covid-19: Secondary | ICD-10-CM | POA: Diagnosis not present

## 2020-06-29 DIAGNOSIS — R55 Syncope and collapse: Secondary | ICD-10-CM | POA: Diagnosis not present

## 2020-06-29 DIAGNOSIS — I2583 Coronary atherosclerosis due to lipid rich plaque: Secondary | ICD-10-CM

## 2020-06-29 LAB — URINALYSIS, ROUTINE W REFLEX MICROSCOPIC
Bacteria, UA: NONE SEEN
Bilirubin Urine: NEGATIVE
Glucose, UA: NEGATIVE mg/dL
Hgb urine dipstick: NEGATIVE
Ketones, ur: NEGATIVE mg/dL
Nitrite: NEGATIVE
Protein, ur: NEGATIVE mg/dL
Specific Gravity, Urine: 1.012 (ref 1.005–1.030)
pH: 5 (ref 5.0–8.0)

## 2020-06-29 LAB — TROPONIN I (HIGH SENSITIVITY)
Troponin I (High Sensitivity): 13 ng/L (ref <18)
Troponin I (High Sensitivity): 13 ng/L (ref <18)

## 2020-06-29 LAB — COMPREHENSIVE METABOLIC PANEL
ALT: 31 U/L (ref 0–44)
AST: 30 U/L (ref 15–41)
Albumin: 3.6 g/dL (ref 3.5–5.0)
Alkaline Phosphatase: 100 U/L (ref 38–126)
Anion gap: 9 (ref 5–15)
BUN: 28 mg/dL — ABNORMAL HIGH (ref 8–23)
CO2: 28 mmol/L (ref 22–32)
Calcium: 10.2 mg/dL (ref 8.9–10.3)
Chloride: 99 mmol/L (ref 98–111)
Creatinine, Ser: 1.22 mg/dL — ABNORMAL HIGH (ref 0.44–1.00)
GFR, Estimated: 39 mL/min — ABNORMAL LOW (ref >60)
Glucose, Bld: 93 mg/dL (ref 70–99)
Potassium: 4.7 mmol/L (ref 3.5–5.1)
Sodium: 136 mmol/L (ref 135–145)
Total Bilirubin: 0.9 mg/dL (ref 0.3–1.2)
Total Protein: 7.9 g/dL (ref 6.5–8.1)

## 2020-06-29 LAB — CBC WITH DIFFERENTIAL/PLATELET
Abs Immature Granulocytes: 0.05 10*3/uL (ref 0.00–0.07)
Basophils Absolute: 0 10*3/uL (ref 0.0–0.1)
Basophils Relative: 0 %
Eosinophils Absolute: 0.1 10*3/uL (ref 0.0–0.5)
Eosinophils Relative: 1 %
HCT: 42.2 % (ref 36.0–46.0)
Hemoglobin: 13.7 g/dL (ref 12.0–15.0)
Immature Granulocytes: 1 %
Lymphocytes Relative: 26 %
Lymphs Abs: 2.3 10*3/uL (ref 0.7–4.0)
MCH: 30.9 pg (ref 26.0–34.0)
MCHC: 32.5 g/dL (ref 30.0–36.0)
MCV: 95 fL (ref 80.0–100.0)
Monocytes Absolute: 0.5 10*3/uL (ref 0.1–1.0)
Monocytes Relative: 6 %
Neutro Abs: 6.1 10*3/uL (ref 1.7–7.7)
Neutrophils Relative %: 66 %
Platelets: 229 10*3/uL (ref 150–400)
RBC: 4.44 MIL/uL (ref 3.87–5.11)
RDW: 15.1 % (ref 11.5–15.5)
WBC: 9 10*3/uL (ref 4.0–10.5)
nRBC: 0 % (ref 0.0–0.2)

## 2020-06-29 NOTE — ED Provider Notes (Signed)
[name] Provider Note   CSN: [id number] Arrival date & time: 06/29/20  1927     History Chief Complaint  Patient presents with  . Syncope    [name] is a 87 y.o. female past medical history of CAD, dyspnea, first-degree heart block, A. fib (currently on amiodarone) brought in by EMS for evaluation of syncopal episode.  Patient was at home.  She states that she remembers getting up from her chair and then she remembers being on the floor.  She pressed life alert which prompted EMS to come.  Patient states that she does not think she had any chest pain when she got up from the chair.  She does not know if she felt dizzy or lightheaded.  She states she has not felt well for the last couple days.  She states she was recently treated for UTI and finished her last dose of antibiotics today.  She does report that she has not been eating or drinking much.  No vomiting.  She reports she has an ostomy bag states she has had some decreased output.  She she is also noted a slight tremor of her hands at rest which she states has been ongoing for the last couple weeks.  She has been able to ambulate since this happened but EMS did report some difficulty.  She normally ambulates with the assistance of a walker.  She has not had fever recently.  She denies any chest pain, neck pain, difficulty breathing, abdominal pain, vomiting, numbness/weakness of her arms or legs.  The history is provided by the patient and the EMS personnel.       Past Medical History:  Diagnosis Date  . Arthritis    FINGERS   . CAD (coronary artery disease) CARDIOLOGIST-- DR Angelena Form   a. s/p INF STEMI 7/12: tx with BMS to RCA;  b. cath 08/26/10: pLAD 30%, mLAD 50%, D1 40%, pCFX 95%, mRCA occluded;   c. staged PCI of pCFX with BMS;   d. echo 7/12:   EF 60-65%, mild RAE, mild to moderate AI, mild MR, moderate TR, RVE, PASP 47  . Complication of anesthesia PT STATES "MADE HER FEEL  CRAZY"  . Degeneration of eye    left eye cornea  . Dyspnea   . First degree heart block   . Heart palpitations PAC'S AND SVT RUN'S  PER CARDIOLOGIST NOTE  . History of ST elevation myocardial infarction (STEMI) 08-26-2010-- INFERIOR WALL   S/P PCI  BMS IN RCA AND PROX. CX  . Hyperlipidemia   . Hypertension   . Impaired hearing BILATERAL HEARING AIDS  . Osteopenia   . PAF (paroxysmal atrial fibrillation) ([place])   . PONV (postoperative nausea and vomiting)   . Pulmonary nodules BENIGN  PER CT  10-12-2010  . Rectal Cancer 08/2010   adenocarcinoma   S/P PARTIAL PROCTECTOMY (NO CHEMO OR RADIATION)  . 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 S/P coronary artery stent placement 08/2010   X2  BM    Patient Active Problem List   Diagnosis Date Noted  . Syncope 06/29/2020  . Atrial fibrillation with RVR ([place]) 05/03/2020  . Other cirrhosis of liver ([place]) 05/01/2020  . Paroxysmal atrial fibrillation with rapid ventricular response ([place]) 04/30/2020  . Acute on chronic diastolic CHF (congestive heart failure) ([place]) 04/30/2020  . Elevated LFTs 04/30/2020  . AKI (acute kidney injury) ([place]) 04/30/2020  .  Atrial flutter ([place]) 04/27/2020  . Secondary hypercoagulable state ([place]) 04/27/2020  . Closed fracture of multiple pubic rami, left, initial encounter ([place]) 09/06/2019  . Slurred speech 02/23/2017  . PAF (paroxysmal atrial fibrillation) ([place]) 02/23/2017  . Depression with anxiety 02/23/2017  . TIA (transient ischemic attack)   . Fracture, tibia and fibula 07/06/2015  . Tibia/fibula fracture 07/03/2015  . Lung nodule seen on imaging study 09/05/2013  . Exposure to TB 09/05/2013  . SOB (shortness of breath) 10/02/2011  . Vascular skin changes 07/17/2011  . 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 Anorexia post-op 12/08/2010  . HTN (hypertension) 11/03/2010  . Dizziness 09/15/2010   . CAD (coronary artery disease)   . Hyperlipidemia   . Rectal cancer, pT2uN0(pNX) s/p TEM partial proctectomy 09/07/2010    Past Surgical History:  Procedure Laterality Date  . ABDOMINAL HYSTERECTOMY  1950's   AND APPENDECTOMY  . CATARACT EXTRACTION W/ INTRAOCULAR LENS  IMPLANT, BILATERAL    . CORONARY ANGIOPLASTY WITH STENT PLACEMENT  08-26-2010  DR K[place]ong   PCI, BM STENT IN RCA  . CORONARY ANGIOPLASTY WITH STENT PLACEMENT  08-29-2010  DR [place]   PCI, BM STENT IN PROXIMAL CIRCUMFLEX  . EXCISION BENIGN CYST RIGHT BREAST    . FISTULA PLUG N/A 06/21/2012   Procedure: insertion of FISTULA PLUG;  Surgeon: Leighton Ruff, MD;  Location: [facility];  Service: General;  Laterality: N/A;  . FLEXIBLE SIGMOIDOSCOPY N/A 04/02/2012   Procedure: FLEXIBLE SIGMOIDOSCOPY;  Surgeon: Leighton Ruff, MD;  Location: [facility];  Service: Endoscopy;  Laterality: N/A;  . KNEE SURGERY Right 1996  . LAPROSCOPY LYSIS ADHESIONS/ DRAINAGE OF PELVIC ABSCESS/ DIVERTING LOOP SIGMOID COLECTOMY  11-22-2010   POST OP RECTOVAGINAL FISTULA  . PARTIAL PROCTECTOMY BY TEM  11-17-2010   RECTAL CANCER  . RELEASE LEFT CARPAL TUNNEL/ OSTEOTOMY LEFT DISTAL RADIUS  11-10-2009  . STAPEDECTOMY  1970'S  . TONSILLECTOMY  CHILD  . TOTAL HIP ARTHROPLASTY Right 1992  . TRANSTHORACIC ECHOCARDIOGRAM  10-10-2011   NORMAL LV SIZE WITH MILD FOCAL BASAL SEPTAL HYPERTROPHY/ EF 55-60%/ NORMAL RV SIZE AND LVSF/ BIATRIAL ENLARGEMENT/ MILD TO MODERATE AI  &  TR  . TYMPANOPLASTY Left 12-23-2009     OB History   No obstetric history on file.     Family History  Problem Relation Age of Onset  . Kidney disease Mother   . Heart disease Father   . Cancer Brother     Social History   Tobacco Use  . Smoking status: Never Smoker  . Smokeless tobacco: Never Used  Substance Use Topics  . Alcohol use: No  . Drug use: No    Home Medications Prior to Admission medications   Medication Sig Start Date End Date  Taking? Authorizing Provider  acetaminophen (TYLENOL) 500 MG tablet Take 2 tablets (1,000 mg total) by mouth every 6 (six) hours as needed for mild pain. Patient taking differently: Take 500-1,000 mg by mouth daily as needed for headache (pain). 07/06/15  Yes Rama, Venetia Maxon, MD  amiodarone (PACERONE) 200 MG tablet 1 tab (200 mg total) by mouth twice daily for 1 week followed by 1 tab (200 mg total) by mouth daily thereafter. Patient taking differently: Take 200 mg by mouth daily. 06/25/20  Yes Burnell Blanks, MD  aspirin EC 81 MG tablet Take 81 mg by mouth every morning.   Yes [provider]  beta carotene w/minerals (OCUVITE) tablet Take 1 tablet by mouth every morning.  Yes [provider]  cholecalciferol (VITAMIN D3) 25 MCG (1000 UNIT) tablet Take 1,000 Units by mouth every morning.   Yes [provider]  clonazePAM (KLONOPIN) 0.5 MG tablet Take 0.25 mg by mouth at bedtime. 05/12/20  Yes [provider]  furosemide (LASIX) 40 MG tablet Take 20 mg by mouth every morning.   Yes [provider]  melatonin 5 MG TABS Take 5 mg by mouth at bedtime.   Yes [provider]  metoprolol succinate (TOPROL XL) 25 MG 24 hr tablet Take 0.5 tablets (12.5 mg total) by mouth at bedtime. 04/27/20  Yes [place]enton, Clint R, PA  nitroGLYCERIN (NITROSTAT) 0.4 MG SL tablet Place 0.4 mg under the tongue every 5 (five) minutes as needed for chest pain.   Yes [provider]  nystatin (MYCOSTATIN) 100000 UNIT/ML suspension Take 5 mLs by mouth 4 (four) times daily.   Yes [provider]  Polyethyl Glycol-Propyl Glycol (SYSTANE) 0.4-0.3 % GEL ophthalmic gel Place 1 drop into both eyes at bedtime.    Yes [provider]  sertraline (ZOLOFT) 25 MG tablet Take 25 mg by mouth at bedtime. 10/28/19  Yes [provider]  levofloxacin (LEVAQUIN) 750 MG tablet Take 750 mg by mouth See admin instructions. Qd x 5 days Patient not taking:  Reported on 06/29/2020 06/25/20   [provider]  nitrofurantoin, macrocrystal-monohydrate, (MACROBID) 100 MG capsule Take 100 mg by mouth See admin instructions. Bid x 7 days Patient not taking: Reported on 06/29/2020 06/24/20   [provider]    Allergies    Effexor [venlafaxine hydrochloride], Penicillins, Sulfa antibiotics, Dicyclomine, Lactose intolerance (gi), and Hydrocodone  Review of Systems   Review of Systems  Constitutional: Negative for chills and fever.  HENT: Negative for congestion.   Eyes: Negative for visual disturbance.  Respiratory: Negative for cough and shortness of breath.   Cardiovascular: Negative for chest pain.  Gastrointestinal: Negative for abdominal pain, diarrhea, nausea and vomiting.  Genitourinary: Negative for dysuria and hematuria.  Musculoskeletal: Negative for back pain and neck pain.  Skin: Negative for rash.  Neurological: Positive for tremors, syncope, weakness (generalized) and light-headedness. Negative for dizziness, numbness and headaches.  Psychiatric/Behavioral: Negative for confusion.  All other systems reviewed and are negative.   Physical Exam Updated Vital Signs BP (!) 154/55   Pulse 60   Temp 98.9 F (37.2 C) (Oral)   Resp 15   SpO2 96%   Physical Exam Vitals and nursing note reviewed.  Constitutional:      Appearance: Normal appearance. She is well-developed.  HENT:     Head: Normocephalic and atraumatic.      Comments: Small hematoma, tenderness in the posterior head.  No open wound. Eyes:     General: Lids are normal.     Conjunctiva/sclera: Conjunctivae normal.     Pupils: Pupils are equal, round, and reactive to light.     Comments: PERRL. EOMs intact. No nystagmus. No neglect.   Neck:     Comments: Full flexion/extension and lateral movement of neck fully intact. No bony midline tenderness. No deformities or crepitus.  Cardiovascular:     Rate and Rhythm: Normal rate and regular rhythm.      Pulses: Normal pulses.          Radial pulses are 2+ on the right side and 2+ on the left side.       Dorsalis pedis pulses are 2+ on the right side and 2+ on the left side.  Heart sounds: Normal heart sounds. No murmur heard. No friction rub. No gallop.   Pulmonary:     Effort: Pulmonary effort is normal.     Breath sounds: Normal breath sounds.     Comments: Lungs clear to auscultation bilaterally.  Symmetric chest rise.  No wheezing, rales, rhonchi. Abdominal:     Palpations: Abdomen is soft. Abdomen is not rigid.     Tenderness: There is no abdominal tenderness. There is no guarding.       Comments: Abdomen soft, nondistended.  No tenderness.  Ostomy bag noted to left side.  Good output.  Musculoskeletal:        General: Normal range of motion.     Cervical back: Full passive range of motion without pain.  Skin:    General: Skin is warm and dry.     Capillary Refill: Capillary refill takes less than 2 seconds.  Neurological:     Mental Status: She is alert and oriented to person, place, and time.     Comments: Cranial nerves III-XII intact Follows commands, Moves all extremities  5/5 strength to BUE and BLE  Sensation intact throughout all major nerve distributions No pronator drift  No slurred speech. No facial droop. Slight resting tremor noted to hands at rest.   Psychiatric:        Speech: Speech normal.     ED Results / Procedures / Treatments   Labs (all labs ordered are listed, but only abnormal results are displayed) Labs Reviewed  COMPREHENSIVE METABOLIC PANEL - Abnormal; Notable for the following components:      Result Value   BUN 28 (*)    Creatinine, Ser 1.22 (*)    GFR, Estimated 39 (*)    All other components within normal limits  URINALYSIS, ROUTINE W REFLEX MICROSCOPIC - Abnormal; Notable for the following components:   Leukocytes,Ua TRACE (*)    All other components within normal limits  CBC WITH DIFFERENTIAL/PLATELET  TROPONIN I (HIGH  SENSITIVITY)  TROPONIN I (HIGH SENSITIVITY)    EKG EKG Interpretation  Date/Time:  Tuesday Jun 29 2020 19:48:06 EDT Ventricular Rate:  57 PR Interval:  236 QRS Duration: 148 QT Interval:  505 QTC Calculation: 492 R Axis:   -28 Text Interpretation: Sinus or ectopic atrial rhythm Prolonged PR interval IVCD, consider atypical RBBB Anterolateral infarct, old Confirmed by Randal Buba, April ([id number]) on 06/29/2020 10:21:29 PM   Radiology CT Head Wo Contrast  Result Date: 06/29/2020 CLINICAL DATA:  Fall, syncope EXAM: CT HEAD WITHOUT CONTRAST CT CERVICAL SPINE WITHOUT CONTRAST TECHNIQUE: Multidetector CT imaging of the head and cervical spine was performed following the standard protocol without intravenous contrast. Multiplanar CT image reconstructions of the cervical spine were also generated. COMPARISON:  May 24, 2020 FINDINGS: CT HEAD FINDINGS Brain: No evidence of acute large vascular territory infarction, hemorrhage, hydrocephalus, extra-axial collection or mass lesion/mass effect. Age related global parenchymal volume loss with ex vacuo dilatation of ventricular system. Similar severe burden of chronic small vessel ischemic disease. Vascular: No hyperdense vessel. Atherosclerotic calcifications of the internal carotid arteries at the skull base. Skull: . Negative for fracture or focal lesion. Sinuses/Orbits: Visualized paranasal sinuses and mastoid air cells are predominantly clear. Prior lens surgery. Other: None CT CERVICAL SPINE FINDINGS Alignment: No traumatic subluxation. Skull base and vertebrae: No acute fracture. No primary bone lesion or focal pathologic process. Soft tissues and spinal canal: No prevertebral fluid or swelling. No visible canal hematoma. Disc levels: Multilevel degenerative change of the spine with disc  space narrowing osteophytosis and facet/uncovertebral hypertrophy, which is moderate to advanced dense similar prior. Partial bony fusion across the C3-C4 and C4-C5 disc  space is is also unchanged. Upper chest: Biapical pleuroparenchymal scarring. Other: Carotid artery calcifications IMPRESSION: 1. No evidence of acute intracranial abnormality. 2. Stable age related global parenchymal volume loss and severe chronic small vessel ischemic disease. 3. No evidence of acute fracture or traumatic subluxation of the cervical spine. 4. Stable moderate to advanced multilevel degenerative change of the cervical spine. Electronically Signed   By: Dahlia Bailiff MD   On: 06/29/2020 20:34   CT Cervical Spine Wo Contrast  Result Date: 06/29/2020 CLINICAL DATA:  Fall, syncope EXAM: CT HEAD WITHOUT CONTRAST CT CERVICAL SPINE WITHOUT CONTRAST TECHNIQUE: Multidetector CT imaging of the head and cervical spine was performed following the standard protocol without intravenous contrast. Multiplanar CT image reconstructions of the cervical spine were also generated. COMPARISON:  May 24, 2020 FINDINGS: CT HEAD FINDINGS Brain: No evidence of acute large vascular territory infarction, hemorrhage, hydrocephalus, extra-axial collection or mass lesion/mass effect. Age related global parenchymal volume loss with ex vacuo dilatation of ventricular system. Similar severe burden of chronic small vessel ischemic disease. Vascular: No hyperdense vessel. Atherosclerotic calcifications of the internal carotid arteries at the skull base. Skull: . Negative for fracture or focal lesion. Sinuses/Orbits: Visualized paranasal sinuses and mastoid air cells are predominantly clear. Prior lens surgery. Other: None CT CERVICAL SPINE FINDINGS Alignment: No traumatic subluxation. Skull base and vertebrae: No acute fracture. No primary bone lesion or focal pathologic process. Soft tissues and spinal canal: No prevertebral fluid or swelling. No visible canal hematoma. Disc levels: Multilevel degenerative change of the spine with disc space narrowing osteophytosis and facet/uncovertebral hypertrophy, which is moderate to  advanced dense similar prior. Partial bony fusion across the C3-C4 and C4-C5 disc space is is also unchanged. Upper chest: Biapical pleuroparenchymal scarring. Other: Carotid artery calcifications IMPRESSION: 1. No evidence of acute intracranial abnormality. 2. Stable age related global parenchymal volume loss and severe chronic small vessel ischemic disease. 3. No evidence of acute fracture or traumatic subluxation of the cervical spine. 4. Stable moderate to advanced multilevel degenerative change of the cervical spine. Electronically Signed   By: Dahlia Bailiff MD   On: 06/29/2020 20:34   DG Chest Portable 1 View  Result Date: 06/29/2020 CLINICAL DATA:  Status post fall. EXAM: PORTABLE CHEST 1 VIEW COMPARISON:  June 11, 2020 FINDINGS: The lungs are hyperinflated. Mild, diffuse, chronic appearing increased interstitial lung markings are seen. There is no evidence of focal consolidation, pleural effusion or pneumothorax. The cardiac silhouette is mildly enlarged and unchanged in appearance. Moderate to marked severity calcification of the aortic arch is seen. A chronic deformity is seen involving the left humeral head and neck. IMPRESSION: Stable exam without acute or active cardiopulmonary disease. Electronically Signed   By: Virgina Norfolk M.D.   On: 06/29/2020 20:09    Procedures Procedures   Medications Ordered in ED Medications - No data to display  ED Course  I have reviewed the triage vital signs and the nursing notes.  Pertinent labs & imaging results that were available during my care of the patient were reviewed by me and considered in my medical decision making (see chart for details).    MDM Rules/Calculators/A&P                          87 y.o. F BIB EMS for evaluation of syncopal  episode that occurred earlier today. Patient was getting up from her chair and had a syncope.  She does not report any chest pain.  She is not sure if she felt lightheaded but states she has felt  lightheaded for the last couple days.  Recent treatment for UTI.  Finished antibiotics.  Recently started on amiodarone for A. fib.  States she ran out of it and has not taken the last couple days.  No blood thinners.  On initial arrival she is afebrile, nontoxic-appearing.. Vitals are stable.  She has a small posterior hematoma noted to the scalp.  She thinks she may have fell and hit her head earlier today.  She does have a slight tremor at rest.  No neurodeficit.  We will plan to check labs, imaging.  Lance Bosch is negative.  CMP shows slight AKI with BUN of 28, creatinine 1.22.  UA shows trace leukocytes.  CBC shows no leukocytosis or anemia.  Chest x-ray shows stable exam.  CT head shows no acute intracranial normality.  CT C-spine shows no acute abnormality.  Patient is resting.  She states she feels tired. She does appear drowsy but is easily arouseable.  She is slightly hypertensive here on exam.  She is still able to answer my questions.  At this time, given her age, positive syncope feel that she needs admission for observation, serial troponins.  Discussed patient with Dr. Myna Hidalgo (hospitalist) who accepts patient for admission.   Portions of this note were generated with [employer]. Dictation errors may occur despite best attempts at proofreading.   Final Clinical Impression(s) / ED Diagnoses Final diagnoses:  Syncope, unspecified syncope type  AKI (acute kidney injury) [facility])    Rx / DC Orders ED Discharge Orders    None       Desma Mcgregor 06/29/20 [id number]    Palumbo, April, MD 06/29/20 [id number]

## 2020-06-29 NOTE — ED Triage Notes (Signed)
Pt arrived via [facility] for cc of syncope/fall. EMS report pt was found down after pressing life alert button. Pt states she was walking and felt faint then woke up on the ground, reporting no pain. Pt diagnosed with UTI on 06/24/20 and finished antibiotics. Reports recent onset of resting tremor, decreased ostomy output, decreased oral intake. HTN noted by EMS. A&Ox4.

## 2020-06-30 ENCOUNTER — Encounter (HOSPITAL_COMMUNITY): Payer: Self-pay | Admitting: Family Medicine

## 2020-06-30 ENCOUNTER — Other Ambulatory Visit: Payer: Self-pay

## 2020-06-30 DIAGNOSIS — I5032 Chronic diastolic (congestive) heart failure: Secondary | ICD-10-CM | POA: Diagnosis not present

## 2020-06-30 DIAGNOSIS — N179 Acute kidney failure, unspecified: Secondary | ICD-10-CM | POA: Diagnosis not present

## 2020-06-30 DIAGNOSIS — R55 Syncope and collapse: Secondary | ICD-10-CM | POA: Diagnosis not present

## 2020-06-30 DIAGNOSIS — I2510 Atherosclerotic heart disease of native coronary artery without angina pectoris: Secondary | ICD-10-CM | POA: Diagnosis not present

## 2020-06-30 LAB — BASIC METABOLIC PANEL
Anion gap: 8 (ref 5–15)
BUN: 23 mg/dL (ref 8–23)
CO2: 25 mmol/L (ref 22–32)
Calcium: 9.1 mg/dL (ref 8.9–10.3)
Chloride: 103 mmol/L (ref 98–111)
Creatinine, Ser: 1.14 mg/dL — ABNORMAL HIGH (ref 0.44–1.00)
GFR, Estimated: 42 mL/min — ABNORMAL LOW (ref >60)
Glucose, Bld: 162 mg/dL — ABNORMAL HIGH (ref 70–99)
Potassium: 4.1 mmol/L (ref 3.5–5.1)
Sodium: 136 mmol/L (ref 135–145)

## 2020-06-30 LAB — CBC
HCT: 37.4 % (ref 36.0–46.0)
Hemoglobin: 11.8 g/dL — ABNORMAL LOW (ref 12.0–15.0)
MCH: 30.3 pg (ref 26.0–34.0)
MCHC: 31.6 g/dL (ref 30.0–36.0)
MCV: 95.9 fL (ref 80.0–100.0)
Platelets: 188 10*3/uL (ref 150–400)
RBC: 3.9 MIL/uL (ref 3.87–5.11)
RDW: 15.1 % (ref 11.5–15.5)
WBC: 9.7 10*3/uL (ref 4.0–10.5)
nRBC: 0 % (ref 0.0–0.2)

## 2020-06-30 LAB — SARS CORONAVIRUS 2 (TAT 6-24 HRS): SARS Coronavirus 2: NEGATIVE

## 2020-06-30 LAB — CBG MONITORING, ED
Glucose-Capillary: 98 mg/dL (ref 70–99)
Glucose-Capillary: 94 mg/dL (ref 70–99)

## 2020-06-30 MED ORDER — METOPROLOL SUCCINATE ER 25 MG PO TB24
12.5000 mg | ORAL_TABLET | Freq: Every day | ORAL | Status: DC
  Administered 2020-06-30: 12.5 mg via ORAL
  Filled 2020-06-30: qty 1

## 2020-06-30 MED ORDER — ASPIRIN EC 81 MG PO TBEC
81.0000 mg | DELAYED_RELEASE_TABLET | Freq: Every morning | ORAL | Status: DC
  Administered 2020-06-30 – 2020-07-02 (×3): 81 mg via ORAL
  Filled 2020-06-30 (×3): qty 1

## 2020-06-30 MED ORDER — SERTRALINE HCL 50 MG PO TABS
25.0000 mg | ORAL_TABLET | Freq: Every day | ORAL | Status: DC
  Administered 2020-06-30 – 2020-07-01 (×3): 25 mg via ORAL
  Filled 2020-06-30 (×4): qty 1

## 2020-06-30 MED ORDER — ACETAMINOPHEN 650 MG RE SUPP: 650.0000 mg | Freq: Four times a day (QID) | RECTAL | Status: DC | PRN

## 2020-06-30 MED ORDER — HEPARIN SODIUM (PORCINE) 5000 UNIT/ML IJ SOLN
5000.0000 [IU] | Freq: Three times a day (TID) | INTRAMUSCULAR | Status: DC
  Administered 2020-06-30 – 2020-07-02 (×7): 5000 [IU] via SUBCUTANEOUS
  Filled 2020-06-30 (×7): qty 1

## 2020-06-30 MED ORDER — ACETAMINOPHEN 325 MG PO TABS
650.0000 mg | ORAL_TABLET | Freq: Four times a day (QID) | ORAL | Status: DC | PRN
  Administered 2020-06-30: 650 mg via ORAL
  Filled 2020-06-30: qty 2

## 2020-06-30 MED ORDER — ONDANSETRON HCL 4 MG/2ML IJ SOLN: 4.0000 mg | Freq: Four times a day (QID) | INTRAMUSCULAR | Status: DC | PRN

## 2020-06-30 MED ORDER — AMIODARONE HCL 200 MG PO TABS
200.0000 mg | ORAL_TABLET | Freq: Every day | ORAL | Status: DC
  Administered 2020-06-30 – 2020-07-02 (×3): 200 mg via ORAL
  Filled 2020-06-30 (×3): qty 1

## 2020-06-30 MED ORDER — SENNOSIDES-DOCUSATE SODIUM 8.6-50 MG PO TABS: 1.0000 | ORAL_TABLET | Freq: Every evening | ORAL | Status: DC | PRN

## 2020-06-30 MED ORDER — SODIUM CHLORIDE 0.9% FLUSH
3.0000 mL | Freq: Two times a day (BID) | INTRAVENOUS | Status: DC
  Administered 2020-06-30 – 2020-07-02 (×4): 3 mL via INTRAVENOUS

## 2020-06-30 MED ORDER — CLONAZEPAM 0.25 MG PO TBDP
0.2500 mg | ORAL_TABLET | Freq: Every day | ORAL | Status: DC
  Administered 2020-06-30 – 2020-07-01 (×2): 0.25 mg via ORAL
  Filled 2020-06-30 (×2): qty 1

## 2020-06-30 MED ORDER — MELATONIN 5 MG PO TABS
5.0000 mg | ORAL_TABLET | Freq: Every day | ORAL | Status: DC
  Administered 2020-06-30 – 2020-07-01 (×3): 5 mg via ORAL
  Filled 2020-06-30 (×4): qty 1

## 2020-06-30 MED ORDER — POLYVINYL ALCOHOL 1.4 % OP SOLN
1.0000 [drp] | Freq: Every day | OPHTHALMIC | Status: DC
  Administered 2020-06-30 – 2020-07-01 (×2): 1 [drp] via OPHTHALMIC
  Filled 2020-06-30: qty 15

## 2020-06-30 MED ORDER — ONDANSETRON HCL 4 MG PO TABS: 4.0000 mg | ORAL_TABLET | Freq: Four times a day (QID) | ORAL | Status: DC | PRN

## 2020-06-30 MED ORDER — SODIUM CHLORIDE 0.9 % IV SOLN: INTRAVENOUS | Status: DC

## 2020-06-30 MED ORDER — NYSTATIN 100000 UNIT/ML MT SUSP
5.0000 mL | Freq: Four times a day (QID) | OROMUCOSAL | Status: DC
  Administered 2020-06-30 – 2020-07-02 (×7): 500000 [IU] via ORAL
  Filled 2020-06-30 (×7): qty 5

## 2020-06-30 MED ORDER — POLYETHYL GLYCOL-PROPYL GLYCOL 0.4-0.3 % OP GEL: Freq: Every day | OPHTHALMIC | Status: DC

## 2020-06-30 NOTE — H&P (Signed)
History and Physical    [name] [id number] DOB: [birth year] DOA: 06/29/2020  PCP: Leonard Downing, MD   Patient coming from: Home   Chief Complaint: LOC   HPI: [name] is a 87 y.o. female with medical history significant for CAD, paroxysmal atrial fibrillation not anticoagulated, chronic diastolic CHF, and insomnia, now presenting to the emergency department after a transient loss of consciousness at home.  Patient lives home alone, just completed a course of Levaquin for UTI earlier today, no longer has urinary symptoms, but reports recent decreased appetite and decreased oral intake that she attributes to the antibiotic.  She was seated today, reports that she stood up and took 1 or 2 steps, and then woke up on the ground and pressed her life alert button.  She denies any chest pain or palpitations.  She denies any headache or focal numbness or weakness.  ED Course: Upon arrival to the ED, patient is found to be afebrile, saturating well on room air, and with stable blood pressure.  EKG features sinus or ectopic atrial rhythm with first-degree AV nodal block and nonspecific IVCD.  Chest x-rays negative for acute cardiopulmonary disease.  Head CT negative for acute intracranial abnormality.  No acute fracture or subluxation noted on cervical spine CT.  Chemistry panel features a creatinine 1.22, up from 0.89 late last month.  CBC is unremarkable.  High-sensitivity troponin is normal.  Review of Systems:  All other systems reviewed and apart from HPI, are negative.  Past Medical History:  Diagnosis Date  . Arthritis    FINGERS   . CAD (coronary artery disease) CARDIOLOGIST-- DR Angelena Form   a. s/p INF STEMI 7/12: tx with BMS to RCA;  b. cath 08/26/10: pLAD 30%, mLAD 50%, D1 40%, pCFX 95%, mRCA occluded;   c. staged PCI of pCFX with BMS;   d. echo 7/12:   EF 60-65%, mild RAE, mild to moderate AI, mild MR, moderate TR, RVE, PASP 47  . Complication of anesthesia PT STATES  "MADE HER FEEL CRAZY"  . Degeneration of eye    left eye cornea  . Dyspnea   . First degree heart block   . Heart palpitations PAC'S AND SVT RUN'S  PER CARDIOLOGIST NOTE  . History of ST elevation myocardial infarction (STEMI) 08-26-2010-- INFERIOR WALL   S/P PCI  BMS IN RCA AND PROX. CX  . Hyperlipidemia   . Hypertension   . Impaired hearing BILATERAL HEARING AIDS  . Osteopenia   . PAF (paroxysmal atrial fibrillation) ([place])   . PONV (postoperative nausea and vomiting)   . Pulmonary nodules BENIGN  PER CT  10-12-2010  . Rectal Cancer 08/2010   adenocarcinoma   S/P PARTIAL PROCTECTOMY (NO CHEMO OR RADIATION)  . 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 S/P coronary artery stent placement 08/2010   X2  BM    Past Surgical History:  Procedure Laterality Date  . ABDOMINAL HYSTERECTOMY  1950's   AND APPENDECTOMY  . CATARACT EXTRACTION W/ INTRAOCULAR LENS  IMPLANT, BILATERAL    . CORONARY ANGIOPLASTY WITH STENT PLACEMENT  08-26-2010  DR Alger   PCI, BM STENT IN RCA  . CORONARY ANGIOPLASTY WITH STENT PLACEMENT  08-29-2010  DR Magnet   PCI, BM STENT IN PROXIMAL CIRCUMFLEX  . EXCISION BENIGN CYST RIGHT BREAST    . FISTULA PLUG N/A 06/21/2012   Procedure: insertion of FISTULA PLUG;  Surgeon: Leighton Ruff,  MD;  Location: [facility];  Service: General;  Laterality: N/A;  . FLEXIBLE SIGMOIDOSCOPY N/A 04/02/2012   Procedure: FLEXIBLE SIGMOIDOSCOPY;  Surgeon: Leighton Ruff, MD;  Location: [facility];  Service: Endoscopy;  Laterality: N/A;  . KNEE SURGERY Right 1996  . LAPROSCOPY LYSIS ADHESIONS/ DRAINAGE OF PELVIC ABSCESS/ DIVERTING LOOP SIGMOID COLECTOMY  11-22-2010   POST OP RECTOVAGINAL FISTULA  . PARTIAL PROCTECTOMY BY TEM  11-17-2010   RECTAL CANCER  . RELEASE LEFT CARPAL TUNNEL/ OSTEOTOMY LEFT DISTAL RADIUS  11-10-2009  . STAPEDECTOMY  1970'S  . TONSILLECTOMY  CHILD  . TOTAL HIP  ARTHROPLASTY Right 1992  . TRANSTHORACIC ECHOCARDIOGRAM  10-10-2011   NORMAL LV SIZE WITH MILD FOCAL BASAL SEPTAL HYPERTROPHY/ EF 55-60%/ NORMAL RV SIZE AND LVSF/ BIATRIAL ENLARGEMENT/ MILD TO MODERATE AI  &  TR  . TYMPANOPLASTY Left 12-23-2009    Social History:   reports that she has never smoked. She has never used smokeless tobacco. She reports that she does not drink alcohol and does not use drugs.  Allergies  Allergen Reactions  . Effexor [Venlafaxine Hydrochloride] Nausea And Vomiting  . Penicillins Shortness Of Breath    Has patient had a PCN reaction causing immediate rash, facial/tongue/throat swelling, SOB or lightheadedness with hypotension: Yes Has patient had a PCN reaction causing severe rash involving mucus membranes or skin necrosis: No Has patient had a PCN reaction that required hospitalization: No Has patient had a PCN reaction occurring within the last 10 years: No If all of the above answers are "NO", then may proceed with Cephalosporin use.   . Sulfa Antibiotics Nausea Only  . Dicyclomine Other (See Comments)    Caused confusion  . Lactose Intolerance (Gi) Diarrhea  . Hydrocodone Nausea Only    Family History  Problem Relation Age of Onset  . Kidney disease Mother   . Heart disease Father   . Cancer Brother      Prior to Admission medications   Medication Sig Start Date End Date Taking? Authorizing Provider  acetaminophen (TYLENOL) 500 MG tablet Take 2 tablets (1,000 mg total) by mouth every 6 (six) hours as needed for mild pain. Patient taking differently: Take 500-1,000 mg by mouth daily as needed for headache (pain). 07/06/15  Yes Rama, Venetia Maxon, MD  amiodarone (PACERONE) 200 MG tablet 1 tab (200 mg total) by mouth twice daily for 1 week followed by 1 tab (200 mg total) by mouth daily thereafter. Patient taking differently: Take 200 mg by mouth daily. 06/25/20  Yes Burnell Blanks, MD  aspirin EC 81 MG tablet Take 81 mg by mouth every  morning.   Yes [provider]  beta carotene w/minerals (OCUVITE) tablet Take 1 tablet by mouth every morning.   Yes [provider]  cholecalciferol (VITAMIN D3) 25 MCG (1000 UNIT) tablet Take 1,000 Units by mouth every morning.   Yes [provider]  clonazePAM (KLONOPIN) 0.5 MG tablet Take 0.25 mg by mouth at bedtime. 05/12/20  Yes [provider]  furosemide (LASIX) 40 MG tablet Take 20 mg by mouth every morning.   Yes [provider]  melatonin 5 MG TABS Take 5 mg by mouth at bedtime.   Yes [provider]  metoprolol succinate (TOPROL XL) 25 MG 24 hr tablet Take 0.5 tablets (12.5 mg total) by mouth at bedtime. 04/27/20  Yes Fenton, Clint R, PA  nitroGLYCERIN (NITROSTAT) 0.4 MG SL tablet Place 0.4 mg under the tongue every 5 (five) minutes as needed for  chest pain.   Yes [provider]  nystatin (MYCOSTATIN) 100000 UNIT/ML suspension Take 5 mLs by mouth 4 (four) times daily.   Yes [provider]  Polyethyl Glycol-Propyl Glycol (SYSTANE) 0.4-0.3 % GEL ophthalmic gel Place 1 drop into both eyes at bedtime.    Yes [provider]  sertraline (ZOLOFT) 25 MG tablet Take 25 mg by mouth at bedtime. 10/28/19  Yes [provider]    Physical Exam: Vitals:   06/29/20 2200 06/29/20 2215 06/29/20 2230 06/29/20 2245  BP: (!) 163/60 (!) 148/47 (!) 135/48 (!) 154/55  Pulse: 61 (!) 58 (!) 56 60  Resp: 14 19 19 15   Temp:      TempSrc:      SpO2: 93% 95% 93% 96%    Constitutional: NAD, calm  Eyes: PERTLA, lids and conjunctivae normal ENMT: Mucous membranes are moist. Posterior pharynx clear of any exudate or lesions.   Neck: supple, no masses  Respiratory: clear to auscultation bilaterally, no wheezing, no crackles. No accessory muscle use.  Cardiovascular: S1 & S2 heard, regular rate and rhythm. No significant JVD. Abdomen: No distension, no tenderness, soft. Bowel sounds active.  Musculoskeletal: no clubbing  / cyanosis. No joint deformity upper and lower extremities.   Skin: no significant rashes, lesions, ulcers. Warm, dry, well-perfused. Neurologic: CN 2-12 grossly intact. Sensation intact. Moving all exremities.  Psychiatric: Alert and oriented to person, place, and situation. Very pleasant and cooperative.    Labs and Imaging on Admission: I have personally reviewed following labs and imaging studies  CBC: Recent Labs  Lab 06/29/20 1928  WBC 9.0  NEUTROABS 6.1  HGB 13.7  HCT 42.2  MCV 95.0  PLT [id number]   Basic Metabolic Panel: Recent Labs  Lab 06/29/20 1928  NA 136  K 4.7  CL 99  CO2 28  GLUCOSE 93  BUN 28*  CREATININE 1.22*  CALCIUM 10.2   GFR: CrCl cannot be calculated (Unknown ideal weight.). Liver Function Tests: Recent Labs  Lab 06/29/20 1928  AST 30  ALT 31  ALKPHOS 100  BILITOT 0.9  PROT 7.9  ALBUMIN 3.6   No results for input(s): LIPASE, AMYLASE in the last 168 hours. No results for input(s): AMMONIA in the last 168 hours. Coagulation Profile: No results for input(s): INR, PROTIME in the last 168 hours. Cardiac Enzymes: No results for input(s): CKTOTAL, CKMB, CKMBINDEX, TROPONINI in the last 168 hours. BNP (last 3 results) No results for input(s): PROBNP in the last 8760 hours. HbA1C: No results for input(s): HGBA1C in the last 72 hours. CBG: No results for input(s): GLUCAP in the last 168 hours. Lipid Profile: No results for input(s): CHOL, HDL, LDLCALC, TRIG, CHOLHDL, LDLDIRECT in the last 72 hours. Thyroid Function Tests: No results for input(s): TSH, T4TOTAL, FREET4, T3FREE, THYROIDAB in the last 72 hours. Anemia Panel: No results for input(s): VITAMINB12, FOLATE, FERRITIN, TIBC, IRON, RETICCTPCT in the last 72 hours. Urine analysis:    Component Value Date/Time   COLORURINE YELLOW 06/29/2020 2042   APPEARANCEUR CLEAR 06/29/2020 2042   LABSPEC 1.012 06/29/2020 2042   PHURINE 5.0 06/29/2020 2042   GLUCOSEU NEGATIVE 06/29/2020 2042   HGBUR  NEGATIVE 06/29/2020 2042   BILIRUBINUR NEGATIVE 06/29/2020 2042   KETONESUR NEGATIVE 06/29/2020 2042   PROTEINUR NEGATIVE 06/29/2020 2042   UROBILINOGEN 0.2 10/01/2014 1628   NITRITE NEGATIVE 06/29/2020 2042   LEUKOCYTESUR TRACE (A) 06/29/2020 2042   Sepsis Labs: @LABRCNTIP (procalcitonin:4,lacticidven:4) )No results found for this or any previous visit (from the past  240 hour(s)).   Radiological Exams on Admission: CT Head Wo Contrast  Result Date: 06/29/2020 CLINICAL DATA:  Fall, syncope EXAM: CT HEAD WITHOUT CONTRAST CT CERVICAL SPINE WITHOUT CONTRAST TECHNIQUE: Multidetector CT imaging of the head and cervical spine was performed following the standard protocol without intravenous contrast. Multiplanar CT image reconstructions of the cervical spine were also generated. COMPARISON:  May 24, 2020 FINDINGS: CT HEAD FINDINGS Brain: No evidence of acute large vascular territory infarction, hemorrhage, hydrocephalus, extra-axial collection or mass lesion/mass effect. Age related global parenchymal volume loss with ex vacuo dilatation of ventricular system. Similar severe burden of chronic small vessel ischemic disease. Vascular: No hyperdense vessel. Atherosclerotic calcifications of the internal carotid arteries at the skull base. Skull: . Negative for fracture or focal lesion. Sinuses/Orbits: Visualized paranasal sinuses and mastoid air cells are predominantly clear. Prior lens surgery. Other: None CT CERVICAL SPINE FINDINGS Alignment: No traumatic subluxation. Skull base and vertebrae: No acute fracture. No primary bone lesion or focal pathologic process. Soft tissues and spinal canal: No prevertebral fluid or swelling. No visible canal hematoma. Disc levels: Multilevel degenerative change of the spine with disc space narrowing osteophytosis and facet/uncovertebral hypertrophy, which is moderate to advanced dense similar prior. Partial bony fusion across the C3-C4 and C4-C5 disc space is is also  unchanged. Upper chest: Biapical pleuroparenchymal scarring. Other: Carotid artery calcifications IMPRESSION: 1. No evidence of acute intracranial abnormality. 2. Stable age related global parenchymal volume loss and severe chronic small vessel ischemic disease. 3. No evidence of acute fracture or traumatic subluxation of the cervical spine. 4. Stable moderate to advanced multilevel degenerative change of the cervical spine. Electronically Signed   By: Dahlia Bailiff MD   On: 06/29/2020 20:34   CT Cervical Spine Wo Contrast  Result Date: 06/29/2020 CLINICAL DATA:  Fall, syncope EXAM: CT HEAD WITHOUT CONTRAST CT CERVICAL SPINE WITHOUT CONTRAST TECHNIQUE: Multidetector CT imaging of the head and cervical spine was performed following the standard protocol without intravenous contrast. Multiplanar CT image reconstructions of the cervical spine were also generated. COMPARISON:  May 24, 2020 FINDINGS: CT HEAD FINDINGS Brain: No evidence of acute large vascular territory infarction, hemorrhage, hydrocephalus, extra-axial collection or mass lesion/mass effect. Age related global parenchymal volume loss with ex vacuo dilatation of ventricular system. Similar severe burden of chronic small vessel ischemic disease. Vascular: No hyperdense vessel. Atherosclerotic calcifications of the internal carotid arteries at the skull base. Skull: . Negative for fracture or focal lesion. Sinuses/Orbits: Visualized paranasal sinuses and mastoid air cells are predominantly clear. Prior lens surgery. Other: None CT CERVICAL SPINE FINDINGS Alignment: No traumatic subluxation. Skull base and vertebrae: No acute fracture. No primary bone lesion or focal pathologic process. Soft tissues and spinal canal: No prevertebral fluid or swelling. No visible canal hematoma. Disc levels: Multilevel degenerative change of the spine with disc space narrowing osteophytosis and facet/uncovertebral hypertrophy, which is moderate to advanced dense similar  prior. Partial bony fusion across the C3-C4 and C4-C5 disc space is is also unchanged. Upper chest: Biapical pleuroparenchymal scarring. Other: Carotid artery calcifications IMPRESSION: 1. No evidence of acute intracranial abnormality. 2. Stable age related global parenchymal volume loss and severe chronic small vessel ischemic disease. 3. No evidence of acute fracture or traumatic subluxation of the cervical spine. 4. Stable moderate to advanced multilevel degenerative change of the cervical spine. Electronically Signed   By: Dahlia Bailiff MD   On: 06/29/2020 20:34   DG Chest Portable 1 View  Result Date: 06/29/2020 CLINICAL DATA:  Status  post fall. EXAM: PORTABLE CHEST 1 VIEW COMPARISON:  June 11, 2020 FINDINGS: The lungs are hyperinflated. Mild, diffuse, chronic appearing increased interstitial lung markings are seen. There is no evidence of focal consolidation, pleural effusion or pneumothorax. The cardiac silhouette is mildly enlarged and unchanged in appearance. Moderate to marked severity calcification of the aortic arch is seen. A chronic deformity is seen involving the left humeral head and neck. IMPRESSION: Stable exam without acute or active cardiopulmonary disease. Electronically Signed   By: Virgina Norfolk M.D.   On: 06/29/2020 20:09    EKG: Independently reviewed. Sinus or ectopic atrial rhythm, 1st degree AV block, IVCD.   Assessment/Plan  1. Syncope  - Presents after a transient LOC that occurred upon standing after a few days of decreased appetite  - Most likely orthostatic in setting of hypovolemia; she does have hx of CAD and PAF on amiodarone and just completed a course of Levaquin raising question of transient arrhythmia  - Check orthostatic vitals, hold Lasix, start gentle IVF hydration, continue cardiac monitoring   2. AKI  - SCr is 1.22 on admission, up from 0.89 two weeks earlier  - Likely mild prerenal azotemia in setting of decreased oral intake  - Hold Lasix, start  gentle IVF hydration, repeat chem panel    3. Paroxysmal atrial fibrillation  - In sinus rhythm on admission  - Not anticoagulated due to risk associated with advanced age and falls  - Continue amiodarone and metoprolol   4. Chronic diastolic CHF  - Appears hypovolemic on admission  - EF was preserved on TTE from March 2022  - Hold Lasix for now, start gentle IVF hydration, monitor volume-status    5. CAD - No anginal complaints, continue ASA and beta-blocker   6. Anxiety, insomnia  - Continue Zoloft and low-dose Klonopin     DVT prophylaxis: sq heparin  Code Status: DNR, confirmed with patient on admission  Level of Care: Level of care: Telemetry Cardiac Family Communication: Daughter updated from ED  Disposition Plan:  Patient is from: Home  Anticipated d/c is to: TBD Anticipated d/c date is: Possibly as early as 06/30/20 Patient currently: Pending orthostatic vitals, repeat chem panel  Consults called: None  Admission status: Observation     Vianne Bulls, MD [facility]  06/30/2020, 12:48 AM

## 2020-06-30 NOTE — ED Notes (Signed)
Pt states she  Feels week and is c/o pain "all over" r/t her fall.  Pt is requesting tylenol.  cbg 94.

## 2020-06-30 NOTE — TOC Initial Note (Signed)
Transition of Care [facility]) - Initial/Assessment Note    Patient Details  Name: [name] MRN: [id number] Date of Birth: [birth year]  Transition of Care [facility]) CM/SW Contact:    Carles Collet, RN Phone Number: 06/30/2020, 4:44 PM  Clinical Narrative:        Spoke to patient at bedside.  Patient comes from home, lives alone, is 89 this month. Readmission for falls. Spoke w patient at bedside. Discussed need for increased strength and balance prior to returning home, and potential ALF needs soon. This brought her distress, but she stated that she would consider SNF placement depending on where, with a preference for [facility]. She was agreeable to be faxed to facilities in [facility].                Barriers to Discharge: Continued Medical Work up   Patient Goals and CMS Choice        Expected Discharge Plan and Services     Discharge Planning Services: CM Consult   Living arrangements for the past 2 months: [place]                                      Prior Living Arrangements/Services Living arrangements for the past 2 months: Single Family Home Lives with:: Self                   Activities of Daily Living      Permission Sought/Granted                  Emotional Assessment              Admission diagnosis:  Syncope [R55] AKI (acute kidney injury) ([place]) [N17.9] Syncope, unspecified syncope type [R55] Patient Active Problem List   Diagnosis Date Noted  . Syncope 06/29/2020  . Atrial fibrillation with RVR ([place]) 05/03/2020  . Other cirrhosis of liver ([place]) 05/01/2020  . Paroxysmal atrial fibrillation with rapid ventricular response ([place]) 04/30/2020  . Chronic diastolic CHF (congestive heart failure) ([place]) 04/30/2020  . Elevated LFTs 04/30/2020  . AKI (acute kidney injury) ([place]) 04/30/2020  . Atrial flutter ([place]) 04/27/2020  . Secondary hypercoagulable state ([place]) 04/27/2020  . Closed fracture of multiple pubic rami,  left, initial encounter ([place]) 09/06/2019  . Slurred speech 02/23/2017  . PAF (paroxysmal atrial fibrillation) ([place]) 02/23/2017  . Depression with anxiety 02/23/2017  . TIA (transient ischemic attack)   . Fracture, tibia and fibula 07/06/2015  . Tibia/fibula fracture 07/03/2015  . Lung nodule seen on imaging study 09/05/2013  . Exposure to TB 09/05/2013  . SOB (shortness of breath) 10/02/2011  . Vascular skin changes 07/17/2011  . 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 Anorexia post-op 12/08/2010  . HTN (hypertension) 11/03/2010  . Dizziness 09/15/2010  . CAD (coronary artery disease)   . Hyperlipidemia   . Rectal cancer, pT2uN0(pNX) s/p TEM partial proctectomy 09/07/2010   PCP:  Leonard Downing, MD Pharmacy:   [facility] [place] - [address] [place]) [place] [address] Phone: [phone] Fax: [phone]  [facility] [address] [place] [address] Phone: [phone] Fax: [phone]     Social Determinants of Health (SDOH) Interventions    Readmission Risk Interventions Readmission Risk Prevention Plan 05/04/2020  Transportation Screening Complete  PCP or Specialist Appt  within 5-7 Days Complete  Home Care Screening Complete  Medication Review (RN CM) Complete  Some recent data might be hidden

## 2020-06-30 NOTE — ED Notes (Signed)
Stood pt up for orthostatics, pt became very weak. Full assist to lay pt back into the bed. Pt now sleeping and resting comfortably.   Pt at 25% of her food said she did not have a need to eat. Tried feeding the pt, pt refused.

## 2020-06-30 NOTE — ED Notes (Signed)
Unable to obtain orthostatic vital signs as pt was too weak to get up. Pt was only to sit on the side of bed. Only ate 25% of her breakfast.

## 2020-06-30 NOTE — Progress Notes (Signed)
PROGRESS NOTE    [name]  BPZ:[id number] DOB: 1917-02-03 DOA: 06/29/2020 PCP: Leonard Downing, MD   Brief Narrative:  [name] is a 87 y.o. female with medical history significant for CAD, paroxysmal atrial fibrillation not anticoagulated, chronic diastolic CHF, and insomnia, now presenting to the emergency department after a transient loss of consciousness at home.  Patient lives home alone, just completed a course of Levaquin for UTI earlier today, no longer has urinary symptoms, but reports recent decreased appetite and decreased oral intake that she attributes to the antibiotic.  She was seated today, reports that she stood up and took 1 or 2 steps, and then woke up on the ground and pressed her life alert button.   Assessment & Plan:   Principal Problem:   Syncope Active Problems:   CAD (coronary artery disease)   PAF (paroxysmal atrial fibrillation) (HCC)   Chronic diastolic CHF (congestive heart failure) (HCC)   AKI (acute kidney injury) (HCC)   Syncope  - Presents after a transient LOC that occurred upon standing after decreased appetite/p.o. intake  - Most likely orthostatic in setting of hypovolemia; she does have hx of CAD and PAF on amiodarone and just completed a course of Levaquin raising question of transient arrhythmia  - Follow orthostatic vital signs, hold diuretics and antihypertensives in the meantime, reinitiate as necessary -currently only on amiodarone -Patient remains on Klonopin at home which is inappropriate given her age -encourage patient to discontinue  AKI in the setting of poor p.o. intake and dehydration  -Creatinine 1.2, baseline around 0.8 - Status post low volume IV fluids, continue to encourage p.o. intake -Lasix currently on hold  Paroxysmal atrial fibrillation  - In sinus rhythm on admission -bradycardic today, discontinue metoprolol as above - Not anticoagulated due to risk associated with advanced age and falls  -  Continue amiodarone  Chronic diastolic CHF  - Appears hypovolemic on admission  - EF was preserved on TTE from March 2022 no further indication for repeat imaging  - Hold Lasix for now, completed IV fluids overnight    CAD - No anginal complaints, continue ASA and beta-blocker   Anxiety, insomnia  - Continue Zoloft and low-dose Klonopin   - Lengthy discussion about cessation of benzodiazepines given her age and fall risk   DVT prophylaxis: sq heparin  Code Status: DNR Family Communication: None present  Status is: Inpatient  Dispo: The patient is from: Home              Anticipated d/c is to: To be determined              Anticipated d/c date is: 48 to 72 hours              Patient currently not medically stable for discharge  Consultants:   None  Procedures:   None  Antimicrobials:  None  Subjective: No acute issues or events overnight denies nausea vomiting diarrhea constipation headache fevers or chills.  She does report tremors overnight which are new for her.  Objective: Vitals:   06/30/20 0600 06/30/20 0630 06/30/20 0700 06/30/20 0724  BP: (!) 134/47 (!) 111/51 (!) 138/47 (!) 123/49  Pulse: (!) 53 (!) 53 (!) 53 (!) 55  Resp: 14 15 19 16   Temp:      TempSrc:      SpO2: 92% 93% 97% 96%   No intake or output data in the 24 hours ending 06/30/20 0751 There were no vitals filed for  this visit.  Examination:  General:  Pleasantly resting in bed, No acute distress. HEENT:  Normocephalic atraumatic.  Sclerae nonicteric, noninjected.  Extraocular movements intact bilaterally. Neck:  Without mass or deformity.  Trachea is midline. Lungs:  Clear to auscultate bilaterally without rhonchi, wheeze, or rales. Heart:  Regular rate and rhythm.  Without murmurs, rubs, or gallops. Abdomen:  Soft, nontender, nondistended.  Without guarding or rebound. Extremities: Without cyanosis, clubbing, edema, or obvious deformity. Vascular:  Dorsalis pedis and posterior  tibial pulses palpable bilaterally. Skin:  Warm and dry, no erythema, no ulcerations.   Data Reviewed: I have personally reviewed following labs and imaging studies  CBC: Recent Labs  Lab 06/29/20 1928 06/30/20 0221  WBC 9.0 9.7  NEUTROABS 6.1  --   HGB 13.7 11.8*  HCT 42.2 37.4  MCV 95.0 95.9  PLT 229 981   Basic Metabolic Panel: Recent Labs  Lab 06/29/20 1928 06/30/20 0221  NA 136 136  K 4.7 4.1  CL 99 103  CO2 28 25  GLUCOSE 93 162*  BUN 28* 23  CREATININE 1.22* 1.14*  CALCIUM 10.2 9.1   GFR: CrCl cannot be calculated (Unknown ideal weight.). Liver Function Tests: Recent Labs  Lab 06/29/20 1928  AST 30  ALT 31  ALKPHOS 100  BILITOT 0.9  PROT 7.9  ALBUMIN 3.6   No results for input(s): LIPASE, AMYLASE in the last 168 hours. No results for input(s): AMMONIA in the last 168 hours. Coagulation Profile: No results for input(s): INR, PROTIME in the last 168 hours. Cardiac Enzymes: No results for input(s): CKTOTAL, CKMB, CKMBINDEX, TROPONINI in the last 168 hours. BNP (last 3 results) No results for input(s): PROBNP in the last 8760 hours. HbA1C: No results for input(s): HGBA1C in the last 72 hours. CBG: Recent Labs  Lab 06/30/20 0615  GLUCAP 98   Lipid Profile: No results for input(s): CHOL, HDL, LDLCALC, TRIG, CHOLHDL, LDLDIRECT in the last 72 hours. Thyroid Function Tests: No results for input(s): TSH, T4TOTAL, FREET4, T3FREE, THYROIDAB in the last 72 hours. Anemia Panel: No results for input(s): VITAMINB12, FOLATE, FERRITIN, TIBC, IRON, RETICCTPCT in the last 72 hours. Sepsis Labs: No results for input(s): PROCALCITON, LATICACIDVEN in the last 168 hours.  Recent Results (from the past 240 hour(s))  SARS CORONAVIRUS 2 (TAT 6-24 HRS) Nasopharyngeal Nasopharyngeal Swab     Status: None   Collection Time: 06/29/20 11:10 PM   Specimen: Nasopharyngeal Swab  Result Value Ref Range Status   SARS Coronavirus 2 NEGATIVE NEGATIVE Final    Comment:  (NOTE) SARS-CoV-2 target nucleic acids are NOT DETECTED.  The SARS-CoV-2 RNA is generally detectable in upper and lower respiratory specimens during the acute phase of infection. Negative results do not preclude SARS-CoV-2 infection, do not rule out co-infections with other pathogens, and should not be used as the sole basis for treatment or other patient management decisions. Negative results must be combined with clinical observations, patient history, and epidemiological information. The expected result is Negative.  Fact Sheet for Patients: SugarRoll.be  Fact Sheet for Healthcare Providers: https://www.woods-mathews.com/  This test is not yet approved or cleared by the [place] FDA and  has been authorized for detection and/or diagnosis of SARS-CoV-2 by FDA under an Emergency Use Authorization (EUA). This EUA will remain  in effect (meaning this test can be used) for the duration of the COVID-19 declaration under Se ction 564(b)(1) of the Act, 21 U.S.C. section 360bbb-3(b)(1), unless the authorization is terminated or revoked sooner.  Performed at  [facility], [place] [address], [place], [place] [address]     Radiology Studies: CT Head Wo Contrast  Result Date: 06/29/2020 CLINICAL DATA:  Fall, syncope EXAM: CT HEAD WITHOUT CONTRAST CT CERVICAL SPINE WITHOUT CONTRAST TECHNIQUE: Multidetector CT imaging of the head and cervical spine was performed following the standard protocol without intravenous contrast. Multiplanar CT image reconstructions of the cervical spine were also generated. COMPARISON:  May 24, 2020 FINDINGS: CT HEAD FINDINGS Brain: No evidence of acute large vascular territory infarction, hemorrhage, hydrocephalus, extra-axial collection or mass lesion/mass effect. Age related global parenchymal volume loss with ex vacuo dilatation of ventricular system. Similar severe burden of chronic small vessel ischemic disease.  Vascular: No hyperdense vessel. Atherosclerotic calcifications of the internal carotid arteries at the skull base. Skull: . Negative for fracture or focal lesion. Sinuses/Orbits: Visualized paranasal sinuses and mastoid air cells are predominantly clear. Prior lens surgery. Other: None CT CERVICAL SPINE FINDINGS Alignment: No traumatic subluxation. Skull base and vertebrae: No acute fracture. No primary bone lesion or focal pathologic process. Soft tissues and spinal canal: No prevertebral fluid or swelling. No visible canal hematoma. Disc levels: Multilevel degenerative change of the spine with disc space narrowing osteophytosis and facet/uncovertebral hypertrophy, which is moderate to advanced dense similar prior. Partial bony fusion across the C3-C4 and C4-C5 disc space is is also unchanged. Upper chest: Biapical pleuroparenchymal scarring. Other: Carotid artery calcifications IMPRESSION: 1. No evidence of acute intracranial abnormality. 2. Stable age related global parenchymal volume loss and severe chronic small vessel ischemic disease. 3. No evidence of acute fracture or traumatic subluxation of the cervical spine. 4. Stable moderate to advanced multilevel degenerative change of the cervical spine. Electronically Signed   By: Dahlia Bailiff MD   On: 06/29/2020 20:34   CT Cervical Spine Wo Contrast  Result Date: 06/29/2020 CLINICAL DATA:  Fall, syncope EXAM: CT HEAD WITHOUT CONTRAST CT CERVICAL SPINE WITHOUT CONTRAST TECHNIQUE: Multidetector CT imaging of the head and cervical spine was performed following the standard protocol without intravenous contrast. Multiplanar CT image reconstructions of the cervical spine were also generated. COMPARISON:  May 24, 2020 FINDINGS: CT HEAD FINDINGS Brain: No evidence of acute large vascular territory infarction, hemorrhage, hydrocephalus, extra-axial collection or mass lesion/mass effect. Age related global parenchymal volume loss with ex vacuo dilatation of  ventricular system. Similar severe burden of chronic small vessel ischemic disease. Vascular: No hyperdense vessel. Atherosclerotic calcifications of the internal carotid arteries at the skull base. Skull: . Negative for fracture or focal lesion. Sinuses/Orbits: Visualized paranasal sinuses and mastoid air cells are predominantly clear. Prior lens surgery. Other: None CT CERVICAL SPINE FINDINGS Alignment: No traumatic subluxation. Skull base and vertebrae: No acute fracture. No primary bone lesion or focal pathologic process. Soft tissues and spinal canal: No prevertebral fluid or swelling. No visible canal hematoma. Disc levels: Multilevel degenerative change of the spine with disc space narrowing osteophytosis and facet/uncovertebral hypertrophy, which is moderate to advanced dense similar prior. Partial bony fusion across the C3-C4 and C4-C5 disc space is is also unchanged. Upper chest: Biapical pleuroparenchymal scarring. Other: Carotid artery calcifications IMPRESSION: 1. No evidence of acute intracranial abnormality. 2. Stable age related global parenchymal volume loss and severe chronic small vessel ischemic disease. 3. No evidence of acute fracture or traumatic subluxation of the cervical spine. 4. Stable moderate to advanced multilevel degenerative change of the cervical spine. Electronically Signed   By: Dahlia Bailiff MD   On: 06/29/2020 20:34   DG Chest Portable 1  View  Result Date: 06/29/2020 CLINICAL DATA:  Status post fall. EXAM: PORTABLE CHEST 1 VIEW COMPARISON:  June 11, 2020 FINDINGS: The lungs are hyperinflated. Mild, diffuse, chronic appearing increased interstitial lung markings are seen. There is no evidence of focal consolidation, pleural effusion or pneumothorax. The cardiac silhouette is mildly enlarged and unchanged in appearance. Moderate to marked severity calcification of the aortic arch is seen. A chronic deformity is seen involving the left humeral head and neck. IMPRESSION:  Stable exam without acute or active cardiopulmonary disease. Electronically Signed   By: Virgina Norfolk M.D.   On: 06/29/2020 20:09   Scheduled Meds: . amiodarone  200 mg Oral Daily  . aspirin EC  81 mg Oral q morning  . clonazePAM  0.25 mg Oral QHS  . heparin  5,000 Units Subcutaneous Q8H  . melatonin  5 mg Oral QHS  . metoprolol succinate  12.5 mg Oral QHS  . Polyethyl Glycol-Propyl Glycol   Both Eyes QHS  . sertraline  25 mg Oral QHS  . sodium chloride flush  3 mL Intravenous Q12H   Continuous Infusions: . sodium chloride 75 mL/hr at 06/30/20 0203     LOS: 0 days   Time spent: 69min  Ahliya Glatt C Jamariya Davidoff, DO [facility]  If 7PM-7AM, please contact night-coverage [email or web address]  06/30/2020, 7:51 AM

## 2020-07-01 DIAGNOSIS — I5032 Chronic diastolic (congestive) heart failure: Secondary | ICD-10-CM | POA: Diagnosis not present

## 2020-07-01 DIAGNOSIS — N179 Acute kidney failure, unspecified: Secondary | ICD-10-CM | POA: Diagnosis not present

## 2020-07-01 DIAGNOSIS — R55 Syncope and collapse: Secondary | ICD-10-CM | POA: Diagnosis not present

## 2020-07-01 DIAGNOSIS — I2510 Atherosclerotic heart disease of native coronary artery without angina pectoris: Secondary | ICD-10-CM | POA: Diagnosis not present

## 2020-07-01 LAB — CBC
HCT: 35.8 % — ABNORMAL LOW (ref 36.0–46.0)
Hemoglobin: 11.5 g/dL — ABNORMAL LOW (ref 12.0–15.0)
MCH: 29.9 pg (ref 26.0–34.0)
MCHC: 32.1 g/dL (ref 30.0–36.0)
MCV: 93.2 fL (ref 80.0–100.0)
Platelets: 175 10*3/uL (ref 150–400)
RBC: 3.84 MIL/uL — ABNORMAL LOW (ref 3.87–5.11)
RDW: 15.6 % — ABNORMAL HIGH (ref 11.5–15.5)
WBC: 7.7 10*3/uL (ref 4.0–10.5)
nRBC: 0 % (ref 0.0–0.2)

## 2020-07-01 LAB — BASIC METABOLIC PANEL
Anion gap: 6 (ref 5–15)
BUN: 17 mg/dL (ref 8–23)
CO2: 23 mmol/L (ref 22–32)
Calcium: 8.9 mg/dL (ref 8.9–10.3)
Chloride: 107 mmol/L (ref 98–111)
Creatinine, Ser: 0.95 mg/dL (ref 0.44–1.00)
GFR, Estimated: 53 mL/min — ABNORMAL LOW (ref >60)
Glucose, Bld: 98 mg/dL (ref 70–99)
Potassium: 4.1 mmol/L (ref 3.5–5.1)
Sodium: 136 mmol/L (ref 135–145)

## 2020-07-01 NOTE — TOC Progression Note (Addendum)
Transition of Care [facility]) - Progression Note    Patient Details  Name: VESTER TITSWORTH MRN: [id number] Date of Birth: 02-14-1916  [facility] CM/SW [place], [place] Phone Number: 07/01/2020, 10:56 AM  Clinical Narrative:     Update- CSW spoke with patient at bedside and provided SNF bed offers. Patient accepted SNF bed offer with [facility]. CSW spoke with Olivia Mackie with [facility] who confirmed they can accept patient for SNF placement. Patients insurance authorization has been approved Auth ID# [id number]. Start date is for 5/20. Next review date is 5/24. CSW will continue to follow.  Update- CSW started insurance authorization for patient. Reference number is # [id number]. Requested start date is for tomorrow 5/20. CSW faxed over clinicals to patients insurance for review. CSW will continue to follow.  CSW received consult for possible SNF placement at time of discharge. CSW spoke with patient at bedside regarding PT recommendation of SNF placement at time of discharge. Patient comes from home alone. Patient expressed understanding of PT recommendation and is agreeable to SNF placement at time of discharge. Patient gave CSW permission to fax out initial referral near the [place] area. Patients first choice is [facility] and 2nd choice [facility]. Patient has received the COVID vaccines. No further questions reported at this time. CSW to continue to follow and assist with discharge planning needs.    Expected Discharge Plan: [place] Barriers to Discharge: Continued Medical Work up  Expected Discharge Plan and Services Expected Discharge Plan: [place] In-house Referral: Clinical Social Work Discharge Planning Services: CM Consult   Living arrangements for the past 2 months: Single Family Home                                       Social Determinants of Health (SDOH) Interventions    Readmission Risk  Interventions Readmission Risk Prevention Plan 05/04/2020  Transportation Screening Complete  PCP or Specialist Appt within 5-7 Days Complete  Home Care Screening Complete  Medication Review (RN CM) Complete  Some recent data might be hidden

## 2020-07-01 NOTE — Evaluation (Signed)
Occupational Therapy Evaluation Patient Details Name: [name] MRN: [id number] DOB: [birth year] Today's Date: 07/01/2020    History of Present Illness Pt is a 87 y.o. female admitted 06/29/20 with syncopal episode, pt woke up on floor and pressed her life alert button. Workup for AKI, likely orthostatic in setting of hypovolemia. EKG features sinus or ectopic atrial rhythm with first-degree AV nodal block and nonspecific IVCD. Head CT negative for acute intracranial abnormality. PMH includes PAF, CHF, CAD, rectal CA s/p colostomy, HTN, MI, osteopenia, HOH. Of note, recent admission 05/2020 with SOB, weakness.   Clinical Impression   Pt was living alone with assistance for showering, IADL and transportation. She walked with a RW. Pt presents with generalized weakness and impaired standing balance. She requires moderate assist for sit<>stand and demonstrates posterior lean with transferring. Pt needs up to moderate assistance for ADL. She reports having more trouble managing at home since her hospitalization last month. Recommending SNF for rehab. Pt is in agreement.     Follow Up Recommendations  SNF;Supervision/Assistance - 24 hour    Equipment Recommendations       Recommendations for Other Services       Precautions / Restrictions Precautions Precautions: Fall;Other (comment) Precaution Comments: colostomy Restrictions Weight Bearing Restrictions: No      Mobility Bed Mobility Overal bed mobility: Needs Assistance Bed Mobility: Supine to Sit     Supine to sit: Mod assist     General bed mobility comments: ModA for HHA to elevate trunk, assist to scoot hips to EOB; pt reports feeling "weak as dirt"    Transfers Overall transfer level: Needs assistance Equipment used: Rolling walker (2 wheeled) Transfers: Sit to/from Stand Sit to Stand: Mod assist         General transfer comment: ModA for trunk elevation and stability, difficulty transitioning UE support to RW;  poor eccentric control into sitting    Balance Overall balance assessment: Needs assistance   Sitting balance-Leahy Scale: Fair     Standing balance support: Bilateral upper extremity supported Standing balance-Leahy Scale: Poor Standing balance comment: Reliant on UE support                           ADL either performed or assessed with clinical judgement   ADL Overall ADL's : Needs assistance/impaired Eating/Feeding: Set up;Sitting   Grooming: Set up;Sitting;Oral care;Brushing hair   Upper Body Bathing: Minimal assistance;Sitting   Lower Body Bathing: Sit to/from stand;Moderate assistance   Upper Body Dressing : Minimal assistance;Sitting   Lower Body Dressing: Sit to/from stand;Moderate assistance   Toilet Transfer: Minimal assistance;Stand-pivot;BSC;RW Toilet Transfer Details (indicate cue type and reason): mod assist to control descent Toileting- Clothing Manipulation and Hygiene: Moderate assistance;Sit to/from stand         General ADL Comments: Pt with significant generalized weakness.     Vision Baseline Vision/History: Wears glasses Patient Visual Report: No change from baseline       Perception     Praxis      Pertinent Vitals/Pain Pain Assessment: Faces Faces Pain Scale: Hurts a little bit Pain Location: Generalized, specifically bottom "from fall" Pain Descriptors / Indicators: Discomfort Pain Intervention(s): Monitored during session;Repositioned     Hand Dominance Right   Extremity/Trunk Assessment Upper Extremity Assessment Upper Extremity Assessment: Generalized weakness   Lower Extremity Assessment Lower Extremity Assessment: Defer to PT evaluation   Cervical / Trunk Assessment Cervical / Trunk Assessment: Kyphotic   Communication Communication Communication: HOH (B  hearin aids)   Cognition Arousal/Alertness: Awake/alert Behavior During Therapy: WFL for tasks assessed/performed Overall Cognitive Status: Within  Functional Limits for tasks assessed                                     General Comments  SpO2 90-92% on RA, HR 57-62; negative orthostatic hypotension, pt asymptomatic    Exercises     Shoulder Instructions      Home Living Family/patient expects to be discharged to:: Private residence Living Arrangements: Alone Available Help at Discharge: Family;Neighbor;Available PRN/intermittently Type of Home: House Home Access: Ramped entrance;Stairs to enter     Home Layout: One level     Bathroom Shower/Tub: Occupational psychologist: Handicapped height Bathroom Accessibility: Yes   Home Equipment: Cane - single point;Grab bars - tub/shower;Shower seat - built in;Walker - 2 wheels;Adaptive equipment;Grab bars - toilet Adaptive Equipment: Reacher;Sock aid;Long-handled shoe horn;Long-handled sponge Additional Comments: showers 1-2x/wk with aide      Prior Functioning/Environment Level of Independence: Independent with assistive device(s)        Comments: Uses RW for ambulation, Does not drive. Fall leading to admission; wears life alert button. Loves to bake/cook; enjoys meeting with church group. Neighbor gets her groceries        OT Problem List: Decreased strength;Decreased activity tolerance;Impaired balance (sitting and/or standing);Decreased knowledge of use of DME or AE;Decreased knowledge of precautions      OT Treatment/Interventions: Self-care/ADL training;Therapeutic activities;Patient/family education;Balance training;DME and/or AE instruction    OT Goals(Current goals can be found in the care plan section) Acute Rehab OT Goals Patient Stated Goal: Willing to consider post-acute rehab at SNF OT Goal Formulation: With patient Time For Goal Achievement: 07/15/20 Potential to Achieve Goals: Good ADL Goals Pt Will Perform Grooming: standing;with min guard assist Pt Will Perform Lower Body Bathing: with min guard assist;sit to/from stand Pt  Will Perform Lower Body Dressing: with min guard assist;sit to/from stand Pt Will Transfer to Toilet: with min guard assist;ambulating;bedside commode (over toilet) Pt Will Perform Toileting - Clothing Manipulation and hygiene: with min guard assist;sit to/from stand  OT Frequency: Min 2X/week   Barriers to D/C: Decreased caregiver support          Co-evaluation              AM-PAC OT "6 Clicks" Daily Activity     Outcome Measure Help from another person eating meals?: None Help from another person taking care of personal grooming?: A Little Help from another person toileting, which includes using toliet, bedpan, or urinal?: A Lot Help from another person bathing (including washing, rinsing, drying)?: A Lot Help from another person to put on and taking off regular upper body clothing?: A Little Help from another person to put on and taking off regular lower body clothing?: A Lot 6 Click Score: 16   End of Session Equipment Utilized During Treatment: Gait belt;Rolling walker  Activity Tolerance: Patient tolerated treatment well Patient left: in chair;with call bell/phone within reach;with chair alarm set  OT Visit Diagnosis: Unsteadiness on feet (R26.81);Muscle weakness (generalized) (M62.81)                Time: 1610-9604 OT Time Calculation (min): 16 min Charges:  OT General Charges $OT Visit: 1 Visit OT Evaluation $OT Eval Moderate Complexity: 1 Mod  Nestor Lewandowsky, OTR/L Acute Rehabilitation Services Pager: [phone] Office: [phone]  Malka So 07/01/2020, 11:02  AM

## 2020-07-01 NOTE — Care Management Obs Status (Signed)
[place] NOTIFICATION   Patient Details  Name: EMON MIGGINS MRN: [id number] Date of Birth: Mar 20, 1916   Medicare Observation Status Notification Given:  Yes    Bethena Roys, RN 07/01/2020, 10:14 AM

## 2020-07-01 NOTE — Progress Notes (Signed)
PROGRESS NOTE    [name]  [id number] DOB: 1916-02-23 DOA: 06/29/2020 PCP: Leonard Downing, MD   Brief Narrative:  [name] is a 87 y.o. female with medical history significant for CAD, paroxysmal atrial fibrillation not anticoagulated, chronic diastolic CHF, and insomnia, now presenting to the emergency department after a transient loss of consciousness at home.  Patient lives home alone, just completed a course of Levaquin for UTI earlier today, no longer has urinary symptoms, but reports recent decreased appetite and decreased oral intake that she attributes to the antibiotic.  She was seated today, reports that she stood up and took 1 or 2 steps, and then woke up on the ground and pressed her life alert button.   Assessment & Plan:   Principal Problem:   Syncope Active Problems:   CAD (coronary artery disease)   PAF (paroxysmal atrial fibrillation) (HCC)   Chronic diastolic CHF (congestive heart failure) (HCC)   AKI (acute kidney injury) (HCC)   Syncope  - Presents after a transient LOC that occurred upon standing after decreased appetite/p.o. intake  - Most likely orthostatic in setting of hypovolemia; she does have hx of CAD and PAF on amiodarone and just completed a course of Levaquin raising question of transient arrhythmia  - Follow orthostatic vital signs, hold diuretics and antihypertensives in the meantime, reinitiate as necessary -currently only on amiodarone - Patient remains on Klonopin at home which is inappropriate given her age -encourage patient to discontinue  AKI in the setting of poor p.o. intake and dehydration, resolved -Creatinine 1.2 at admission, baseline around 0.8 - Status post low volume IV fluids, continue to encourage p.o. intake - Lasix currently on hold  Paroxysmal atrial fibrillation  - In sinus rhythm on admission -bradycardic today, discontinue metoprolol as above - Not anticoagulated due to risk associated with advanced  age and falls  - Continue amiodarone  Chronic diastolic CHF  - Appears hypovolemic on admission  - EF was preserved on TTE from March 2022 no further indication for repeat imaging  - Hold Lasix for now, completed IV fluids overnight    CAD - No anginal complaints, continue ASA and beta-blocker   Anxiety, insomnia  - Continue Zoloft and low-dose Klonopin   - Lengthy discussion about cessation of benzodiazepines given her age and fall risk   DVT prophylaxis: sq heparin  Code Status: DNR Family Communication: None present  Status is: Inpatient  Dispo: The patient is from: Home              Anticipated d/c is to: To be determined - likely SNF              Anticipated d/c date is: 48 to 72 hours              Patient currently not medically stable for discharge  Consultants:   None  Procedures:   None  Antimicrobials:  None  Subjective: No acute issues or events overnight denies nausea vomiting diarrhea constipation headache fevers or chills.  She does report tremors overnight which are new for her.  Objective: Vitals:   06/30/20 1408 06/30/20 1438 07/01/20 0009 07/01/20 0413  BP:  (!) 147/53 (!) 124/49 (!) 148/54  Pulse:  (!) 55 (!) 53 (!) 51  Resp:  18 14 19   Temp: 98.4 F (36.9 C) 99.5 F (37.5 C)    TempSrc: Oral Oral    SpO2:  100% 91% 92%  Weight:    45.4 kg  Intake/Output Summary (Last 24 hours) at 07/01/2020 0726 Last data filed at 07/01/2020 0500 Gross per 24 hour  Intake 788.75 ml  Output 350 ml  Net 438.75 ml   Filed Weights   07/01/20 0413  Weight: 45.4 kg    Examination:  General:  Pleasantly resting in bed, No acute distress. HEENT:  Normocephalic atraumatic.  Sclerae nonicteric, noninjected.  Extraocular movements intact bilaterally. Neck:  Without mass or deformity.  Trachea is midline. Lungs:  Clear to auscultate bilaterally without rhonchi, wheeze, or rales. Heart:  Regular rate and rhythm.  Without murmurs, rubs, or  gallops. Abdomen:  Soft, nontender, nondistended.  Without guarding or rebound. Extremities: Without cyanosis, clubbing, edema, or obvious deformity. Vascular:  Dorsalis pedis and posterior tibial pulses palpable bilaterally. Skin:  Warm and dry, no erythema, no ulcerations.   Data Reviewed: I have personally reviewed following labs and imaging studies  CBC: Recent Labs  Lab 06/29/20 1928 06/30/20 0221 07/01/20 0200  WBC 9.0 9.7 7.7  NEUTROABS 6.1  --   --   HGB 13.7 11.8* 11.5*  HCT 42.2 37.4 35.8*  MCV 95.0 95.9 93.2  PLT 229 188 409   Basic Metabolic Panel: Recent Labs  Lab 06/29/20 1928 06/30/20 0221 07/01/20 0200  NA 136 136 136  K 4.7 4.1 4.1  CL 99 103 107  CO2 28 25 23   GLUCOSE 93 162* 98  BUN 28* 23 17  CREATININE 1.22* 1.14* 0.95  CALCIUM 10.2 9.1 8.9   GFR: Estimated Creatinine Clearance: 21.4 mL/min (by C-G formula based on SCr of 0.95 mg/dL). Liver Function Tests: Recent Labs  Lab 06/29/20 1928  AST 30  ALT 31  ALKPHOS 100  BILITOT 0.9  PROT 7.9  ALBUMIN 3.6   No results for input(s): LIPASE, AMYLASE in the last 168 hours. No results for input(s): AMMONIA in the last 168 hours. Coagulation Profile: No results for input(s): INR, PROTIME in the last 168 hours. Cardiac Enzymes: No results for input(s): CKTOTAL, CKMB, CKMBINDEX, TROPONINI in the last 168 hours. BNP (last 3 results) No results for input(s): PROBNP in the last 8760 hours. HbA1C: No results for input(s): HGBA1C in the last 72 hours. CBG: Recent Labs  Lab 06/30/20 0615 06/30/20 1214  GLUCAP 98 94   Lipid Profile: No results for input(s): CHOL, HDL, LDLCALC, TRIG, CHOLHDL, LDLDIRECT in the last 72 hours. Thyroid Function Tests: No results for input(s): TSH, T4TOTAL, FREET4, T3FREE, THYROIDAB in the last 72 hours. Anemia Panel: No results for input(s): VITAMINB12, FOLATE, FERRITIN, TIBC, IRON, RETICCTPCT in the last 72 hours. Sepsis Labs: No results for input(s):  PROCALCITON, LATICACIDVEN in the last 168 hours.  Recent Results (from the past 240 hour(s))  SARS CORONAVIRUS 2 (TAT 6-24 HRS) Nasopharyngeal Nasopharyngeal Swab     Status: None   Collection Time: 06/29/20 11:10 PM   Specimen: Nasopharyngeal Swab  Result Value Ref Range Status   SARS Coronavirus 2 NEGATIVE NEGATIVE Final    Comment: (NOTE) SARS-CoV-2 target nucleic acids are NOT DETECTED.  The SARS-CoV-2 RNA is generally detectable in upper and lower respiratory specimens during the acute phase of infection. Negative results do not preclude SARS-CoV-2 infection, do not rule out co-infections with other pathogens, and should not be used as the sole basis for treatment or other patient management decisions. Negative results must be combined with clinical observations, patient history, and epidemiological information. The expected result is Negative.  Fact Sheet for Patients: SugarRoll.be  Fact Sheet for Healthcare Providers: https://www.woods-mathews.com/  This test  is not yet approved or cleared by the [place] and  has been authorized for detection and/or diagnosis of SARS-CoV-2 by FDA under an Emergency Use Authorization (EUA). This EUA will remain  in effect (meaning this test can be used) for the duration of the COVID-19 declaration under Se ction 564(b)(1) of the Act, 21 U.S.C. section 360bbb-3(b)(1), unless the authorization is terminated or revoked sooner.  Performed at [facility], [facility] [address] [place], [place] [address]     Radiology Studies: CT Head Wo Contrast  Result Date: 06/29/2020 CLINICAL DATA:  Fall, syncope EXAM: CT HEAD WITHOUT CONTRAST CT CERVICAL SPINE WITHOUT CONTRAST TECHNIQUE: Multidetector CT imaging of the head and cervical spine was performed following the standard protocol without intravenous contrast. Multiplanar CT image reconstructions of the cervical spine were also generated.  COMPARISON:  May 24, 2020 FINDINGS: CT HEAD FINDINGS Brain: No evidence of acute large vascular territory infarction, hemorrhage, hydrocephalus, extra-axial collection or mass lesion/mass effect. Age related global parenchymal volume loss with ex vacuo dilatation of ventricular system. Similar severe burden of chronic small vessel ischemic disease. Vascular: No hyperdense vessel. Atherosclerotic calcifications of the internal carotid arteries at the skull base. Skull: . Negative for fracture or focal lesion. Sinuses/Orbits: Visualized paranasal sinuses and mastoid air cells are predominantly clear. Prior lens surgery. Other: None CT CERVICAL SPINE FINDINGS Alignment: No traumatic subluxation. Skull base and vertebrae: No acute fracture. No primary bone lesion or focal pathologic process. Soft tissues and spinal canal: No prevertebral fluid or swelling. No visible canal hematoma. Disc levels: Multilevel degenerative change of the spine with disc space narrowing osteophytosis and facet/uncovertebral hypertrophy, which is moderate to advanced dense similar prior. Partial bony fusion across the C3-C4 and C4-C5 disc space is is also unchanged. Upper chest: Biapical pleuroparenchymal scarring. Other: Carotid artery calcifications IMPRESSION: 1. No evidence of acute intracranial abnormality. 2. Stable age related global parenchymal volume loss and severe chronic small vessel ischemic disease. 3. No evidence of acute fracture or traumatic subluxation of the cervical spine. 4. Stable moderate to advanced multilevel degenerative change of the cervical spine. Electronically Signed   By: Dahlia Bailiff MD   On: 06/29/2020 20:34   CT Cervical Spine Wo Contrast  Result Date: 06/29/2020 CLINICAL DATA:  Fall, syncope EXAM: CT HEAD WITHOUT CONTRAST CT CERVICAL SPINE WITHOUT CONTRAST TECHNIQUE: Multidetector CT imaging of the head and cervical spine was performed following the standard protocol without intravenous contrast.  Multiplanar CT image reconstructions of the cervical spine were also generated. COMPARISON:  May 24, 2020 FINDINGS: CT HEAD FINDINGS Brain: No evidence of acute large vascular territory infarction, hemorrhage, hydrocephalus, extra-axial collection or mass lesion/mass effect. Age related global parenchymal volume loss with ex vacuo dilatation of ventricular system. Similar severe burden of chronic small vessel ischemic disease. Vascular: No hyperdense vessel. Atherosclerotic calcifications of the internal carotid arteries at the skull base. Skull: . Negative for fracture or focal lesion. Sinuses/Orbits: Visualized paranasal sinuses and mastoid air cells are predominantly clear. Prior lens surgery. Other: None CT CERVICAL SPINE FINDINGS Alignment: No traumatic subluxation. Skull base and vertebrae: No acute fracture. No primary bone lesion or focal pathologic process. Soft tissues and spinal canal: No prevertebral fluid or swelling. No visible canal hematoma. Disc levels: Multilevel degenerative change of the spine with disc space narrowing osteophytosis and facet/uncovertebral hypertrophy, which is moderate to advanced dense similar prior. Partial bony fusion across the C3-C4 and C4-C5 disc space is is also unchanged. Upper chest: Biapical pleuroparenchymal scarring.  Other: Carotid artery calcifications IMPRESSION: 1. No evidence of acute intracranial abnormality. 2. Stable age related global parenchymal volume loss and severe chronic small vessel ischemic disease. 3. No evidence of acute fracture or traumatic subluxation of the cervical spine. 4. Stable moderate to advanced multilevel degenerative change of the cervical spine. Electronically Signed   By: Dahlia Bailiff MD   On: 06/29/2020 20:34   DG Chest Portable 1 View  Result Date: 06/29/2020 CLINICAL DATA:  Status post fall. EXAM: PORTABLE CHEST 1 VIEW COMPARISON:  June 11, 2020 FINDINGS: The lungs are hyperinflated. Mild, diffuse, chronic appearing  increased interstitial lung markings are seen. There is no evidence of focal consolidation, pleural effusion or pneumothorax. The cardiac silhouette is mildly enlarged and unchanged in appearance. Moderate to marked severity calcification of the aortic arch is seen. A chronic deformity is seen involving the left humeral head and neck. IMPRESSION: Stable exam without acute or active cardiopulmonary disease. Electronically Signed   By: Virgina Norfolk M.D.   On: 06/29/2020 20:09   Scheduled Meds: . amiodarone  200 mg Oral Daily  . aspirin EC  81 mg Oral q morning  . clonazePAM  0.25 mg Oral QHS  . heparin  5,000 Units Subcutaneous Q8H  . melatonin  5 mg Oral QHS  . nystatin  5 mL Oral QID  . polyvinyl alcohol  1 drop Both Eyes QHS  . sertraline  25 mg Oral QHS  . sodium chloride flush  3 mL Intravenous Q12H   Continuous Infusions:    LOS: 0 days   Time spent: 84min  Loreen Bankson C Elaynah [place], DO [facility]  If 7PM-7AM, please contact night-coverage [email or web address]  07/01/2020, 7:26 AM

## 2020-07-01 NOTE — Evaluation (Signed)
Physical Therapy Evaluation Patient Details Name: [name] MRN: [id number] DOB: 12-31-1916 Today's Date: 07/01/2020   History of Present Illness  Pt is a 87 y.o. female admitted 06/29/20 with syncopal episode, pt woke up on floor and pressed her life alert button. Workup for AKI, likely orthostatic in setting of hypovolemia. EKG features sinus or ectopic atrial rhythm with first-degree AV nodal block and nonspecific IVCD. Head CT negative for acute intracranial abnormality. PMH includes PAF, CHF, CAD, rectal CA s/p colostomy, HTN, MI, osteopenia, HOH. Of note, recent admission 05/2020 with SOB, weakness.    Clinical Impression  Pt presents with an overall decrease in functional mobility secondary to above. PTA, pt mod indep ambulating with RW, lives alone with intermittent assist from family/friends, pt endorses increased weakness at home as well as fall leading to admission. Today, pt requiring up to [facility] for mobility with RW; pt limited by generalized weakness ("I'm weak as dirt"), decreased activity tolerance and impaired balance strategies. Pt would benefit from continued acute PT services to maximize functional mobility and independence prior to d/c with short-term SNF-level therapies.    Orthostatic BPs Supine 139/60  Sitting 146/58  Standing 143/72  Standing after 2-min 131/64  Sitting in recliner 149/76     Follow Up Recommendations SNF;Supervision for mobility/OOB    Equipment Recommendations  None recommended by PT    Recommendations for Other Services       Precautions / Restrictions Precautions Precautions: Fall;Other (comment) Precaution Comments: Urine incontinence, ostomy Restrictions Weight Bearing Restrictions: No      Mobility  Bed Mobility Overal bed mobility: Needs Assistance Bed Mobility: Supine to Sit     Supine to sit: Mod assist     General bed mobility comments: ModA for HHA to elevate trunk, assist to scoot hips to EOB; pt reports feeling  "weak as dirt"    Transfers Overall transfer level: Needs assistance Equipment used: Rolling walker (2 wheeled) Transfers: Sit to/from Stand Sit to Stand: Mod assist         General transfer comment: ModA for trunk elevation and stability, difficulty transitioning UE support to RW; poor eccentric control into sitting  Ambulation/Gait Ambulation/Gait assistance: Min assist;Mod assist;+2 safety/equipment Gait Distance (Feet): 2 Feet Assistive device: Rolling walker (2 wheeled) Gait Pattern/deviations: Step-to pattern;Trunk flexed;Leaning posteriorly Gait velocity: Decreased   General Gait Details: Slow, unsteady gait to recliner with RW and minA, 2x posterior LOB requiring modA to prevent fall; further distance limited by fatigue  Stairs            Wheelchair Mobility    Modified Rankin (Stroke Patients Only)       Balance Overall balance assessment: Needs assistance   Sitting balance-Leahy Scale: Fair       Standing balance-Leahy Scale: Poor Standing balance comment: Reliant on UE support                             Pertinent Vitals/Pain Pain Assessment: Faces Faces Pain Scale: Hurts a little bit Pain Location: Generalized, specifically bottom "from fall" Pain Intervention(s): Monitored during session;Limited activity within patient's tolerance    Home Living Family/patient expects to be discharged to:: Private residence Living Arrangements: Alone Available Help at Discharge: Family;Neighbor;Available PRN/intermittently Type of Home: House Home Access: Ramped entrance;Stairs to enter     Home Layout: One level Home Equipment: Cane - single point;Grab bars - tub/shower;Shower seat - built in;Walker - 2 wheels;Adaptive equipment;Grab bars - toilet Additional Comments:  showers 1-2x/wk with aide    Prior Function Level of Independence: Independent with assistive device(s)         Comments: Uses RW for ambulation, Does not drive. Fall  leading to admission; wears life alert button. Loves to bake/cook; enjoys meeting with church group. Neighbor gets her groceries     Hand Dominance   Dominant Hand: Right    Extremity/Trunk Assessment   Upper Extremity Assessment Upper Extremity Assessment: Generalized weakness    Lower Extremity Assessment Lower Extremity Assessment: Generalized weakness    Cervical / Trunk Assessment Cervical / Trunk Assessment: Kyphotic  Communication   Communication: HOH (bilateral hearing aids)  Cognition Arousal/Alertness: Awake/alert Behavior During Therapy: WFL for tasks assessed/performed Overall Cognitive Status: Within Functional Limits for tasks assessed                                        General Comments General comments (skin integrity, edema, etc.): SpO2 90-92% on RA, HR 57-62; negative orthostatic hypotension, pt asymptomatic    Exercises     Assessment/Plan    PT Assessment Patient needs continued PT services  PT Problem List Decreased strength;Decreased activity tolerance;Decreased balance;Decreased mobility;Cardiopulmonary status limiting activity       PT Treatment Interventions DME instruction;Gait training;Stair training;Functional mobility training;Therapeutic activities;Therapeutic exercise;Balance training;Patient/family education    PT Goals (Current goals can be found in the Care Plan section)  Acute Rehab PT Goals Patient Stated Goal: Willing to consider post-acute rehab at SNF PT Goal Formulation: With patient Time For Goal Achievement: 07/15/20 Potential to Achieve Goals: Good    Frequency Min 3X/week   Barriers to discharge        Co-evaluation               AM-PAC PT "6 Clicks" Mobility  Outcome Measure Help needed turning from your back to your side while in a flat bed without using bedrails?: A Little Help needed moving from lying on your back to sitting on the side of a flat bed without using bedrails?: A  Lot Help needed moving to and from a bed to a chair (including a wheelchair)?: A Lot Help needed standing up from a chair using your arms (e.g., wheelchair or bedside chair)?: A Lot Help needed to walk in hospital room?: A Lot Help needed climbing 3-5 steps with a railing? : A Lot 6 Click Score: 13    End of Session Equipment Utilized During Treatment: Gait belt Activity Tolerance: Patient tolerated treatment well;Patient limited by fatigue Patient left: in chair;with call bell/phone within reach;with chair alarm set Nurse Communication: Mobility status PT Visit Diagnosis: Other abnormalities of gait and mobility (R26.89);Muscle weakness (generalized) (M62.81)    Time: 3154-0086 PT Time Calculation (min) (ACUTE ONLY): 15 min   Charges:   PT Evaluation $PT Eval Moderate Complexity: Marvin, PT, DPT Acute Rehabilitation Services  Pager [phone] Office [place] 07/01/2020, 9:37 AM

## 2020-07-01 NOTE — [place] FL2 (Signed)
[place] MEDICAID FL2 LEVEL OF CARE SCREENING TOOL     IDENTIFICATION  Patient Name: [name] Birthdate: 1916/05/14 Sex: female Admission Date (Current Location): 06/29/2020  [facility] and [place] Number:  Herbalist and Address:  [facility], [place] [address], [place], [place] [address]      Provider Number: [id number]  Attending Physician Name and Address:  Little Ishikawa, MD  Relative Name and Phone Number:  Freddie Apley [phone]    Current Level of Care: Hospital Recommended Level of Care: [place] Prior Approval Number:    Date Approved/Denied:   PASRR Number: [id number]  Discharge Plan: SNF    Current Diagnoses: Patient Active Problem List   Diagnosis Date Noted  . Syncope 06/29/2020  . Atrial fibrillation with RVR ([place]) 05/03/2020  . Other cirrhosis of liver ([place]) 05/01/2020  . Paroxysmal atrial fibrillation with rapid ventricular response ([place]) 04/30/2020  . Chronic diastolic CHF (congestive heart failure) ([place]) 04/30/2020  . Elevated LFTs 04/30/2020  . AKI (acute kidney injury) ([place]) 04/30/2020  . Atrial flutter ([place]) 04/27/2020  . Secondary hypercoagulable state ([place]) 04/27/2020  . Closed fracture of multiple pubic rami, left, initial encounter ([place]) 09/06/2019  . Slurred speech 02/23/2017  . PAF (paroxysmal atrial fibrillation) ([place]) 02/23/2017  . Depression with anxiety 02/23/2017  . TIA (transient ischemic attack)   . Fracture, tibia and fibula 07/06/2015  . Tibia/fibula fracture 07/03/2015  . Lung nodule seen on imaging study 09/05/2013  . Exposure to TB 09/05/2013  . SOB (shortness of breath) 10/02/2011  . Vascular skin changes 07/17/2011  . 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 Anorexia post-op 12/08/2010  . HTN (hypertension) 11/03/2010  . Dizziness 09/15/2010  . CAD (coronary artery disease)   . Hyperlipidemia   . Rectal cancer, pT2uN0(pNX) s/p TEM  partial proctectomy 09/07/2010    Orientation RESPIRATION BLADDER Height & Weight     Self,Time,Situation,Place (WDL)  Normal Incontinent,External catheter (External Urinary Catheter) Weight: 100 lb 1.4 oz (45.4 kg) Height:     BEHAVIORAL SYMPTOMS/MOOD NEUROLOGICAL BOWEL NUTRITION STATUS      Continent Diet (See discharge summary)  AMBULATORY STATUS COMMUNICATION OF NEEDS Skin   Limited Assist Verbally Other (Comment) (Ecchymosis arm,leg,bilateral,Abrasion,leg,right)                       Personal Care Assistance Level of Assistance  Bathing,Feeding,Dressing Bathing Assistance: Limited assistance Feeding assistance: Independent Dressing Assistance: Limited assistance     Functional Limitations Info  Sight,Hearing,Speech Sight Info: Impaired Hearing Info: Impaired Speech Info: Adequate    SPECIAL CARE FACTORS FREQUENCY  PT (By licensed PT),OT (By licensed OT)     PT Frequency: 5x min weekly OT Frequency: 5x min weekly            Contractures Contractures Info: Not present    Additional Factors Info  Code Status,Allergies,Psychotropic Code Status Info: DNR Allergies Info: Effexor (venlafaxine Hydrochloride),Penicillins,Sulfa Antibiotics,Dicyclomine,Lactose Intolerance (gi),Hydrocodone Psychotropic Info: clonazePAM (KLONOPIN) disintegrating tablet 0.25 mg daily at bedtime,sertraline (ZOLOFT) tablet 25 mg daily at bedtime         Current Medications (07/01/2020):  This is the current hospital active medication list Current Facility-Administered Medications  Medication Dose Route Frequency Provider Last Rate Last Admin  . acetaminophen (TYLENOL) tablet 650 mg  650 mg Oral Q6H PRN Opyd, Ilene Qua, MD   650 mg at 06/30/20 1222   Or  . acetaminophen (TYLENOL) suppository 650 mg  650  mg Rectal Q6H PRN Opyd, Ilene Qua, MD      . amiodarone (PACERONE) tablet 200 mg  200 mg Oral Daily Opyd, Ilene Qua, MD   200 mg at 07/01/20 1031  . aspirin EC tablet 81 mg  81 mg Oral  q morning Opyd, Ilene Qua, MD   81 mg at 07/01/20 1031  . clonazePAM (KLONOPIN) disintegrating tablet 0.25 mg  0.25 mg Oral QHS Opyd, Ilene Qua, MD   0.25 mg at 06/30/20 2054  . heparin injection 5,000 Units  5,000 Units Subcutaneous Q8H Opyd, Ilene Qua, MD   5,000 Units at 06/30/20 1500  . melatonin tablet 5 mg  5 mg Oral QHS Opyd, Ilene Qua, MD   5 mg at 06/30/20 2054  . nystatin (MYCOSTATIN) 100000 UNIT/ML suspension 500,000 Units  5 mL Oral QID Little Ishikawa, MD   500,000 Units at 07/01/20 1031  . ondansetron (ZOFRAN) tablet 4 mg  4 mg Oral Q6H PRN Opyd, Ilene Qua, MD       Or  . ondansetron (ZOFRAN) injection 4 mg  4 mg Intravenous Q6H PRN Opyd, Ilene Qua, MD      . polyvinyl alcohol (LIQUIFILM TEARS) 1.4 % ophthalmic solution 1 drop  1 drop Both Eyes QHS Little Ishikawa, MD   1 drop at 06/30/20 2054  . senna-docusate (Senokot-S) tablet 1 tablet  1 tablet Oral QHS PRN Opyd, Ilene Qua, MD      . sertraline (ZOLOFT) tablet 25 mg  25 mg Oral QHS Opyd, Ilene Qua, MD   25 mg at 06/30/20 2053  . sodium chloride flush (NS) 0.9 % injection 3 mL  3 mL Intravenous Q12H Opyd, Ilene Qua, MD   3 mL at 07/01/20 1032     Discharge Medications: Please see discharge summary for a list of discharge medications.  Relevant Imaging Results:  Relevant Lab Results:   Additional Information [id number]  Trula Ore, LCSWA

## 2020-07-01 NOTE — Progress Notes (Addendum)
Mobility Specialist - Progress Note   07/01/20 1552  Mobility  Activity Ambulated in room  Level of Assistance Minimal assist, patient does 75% or more  Assistive Device Front wheel walker  Distance Ambulated (ft) 40 ft  Mobility Ambulated with assistance in room  Mobility Response Tolerated well  Mobility performed by Mobility specialist  $Mobility charge 1 Mobility   Pt states she is feeling better than this morning and was min assist to stand from recliner  Distance limited by R hip pain. Pt lost balance when turning to sit in recliner, min assist to correct herself. Pt in recliner after walk, call bell at side and chair alarm on.  Pricilla Handler Mobility Specialist Mobility Specialist Phone: [phone]

## 2020-07-02 DIAGNOSIS — I2510 Atherosclerotic heart disease of native coronary artery without angina pectoris: Secondary | ICD-10-CM | POA: Diagnosis not present

## 2020-07-02 DIAGNOSIS — R55 Syncope and collapse: Secondary | ICD-10-CM | POA: Diagnosis not present

## 2020-07-02 DIAGNOSIS — N179 Acute kidney failure, unspecified: Secondary | ICD-10-CM | POA: Diagnosis not present

## 2020-07-02 DIAGNOSIS — I5032 Chronic diastolic (congestive) heart failure: Secondary | ICD-10-CM | POA: Diagnosis not present

## 2020-07-02 LAB — RESP PANEL BY RT-PCR (FLU A&B, COVID) ARPGX2
Influenza A by PCR: NEGATIVE
Influenza B by PCR: NEGATIVE
SARS Coronavirus 2 by RT PCR: NEGATIVE

## 2020-07-02 LAB — CBC
HCT: 32.6 % — ABNORMAL LOW (ref 36.0–46.0)
Hemoglobin: 10.7 g/dL — ABNORMAL LOW (ref 12.0–15.0)
MCH: 30.4 pg (ref 26.0–34.0)
MCHC: 32.8 g/dL (ref 30.0–36.0)
MCV: 92.6 fL (ref 80.0–100.0)
Platelets: 166 10*3/uL (ref 150–400)
RBC: 3.52 MIL/uL — ABNORMAL LOW (ref 3.87–5.11)
RDW: 15.9 % — ABNORMAL HIGH (ref 11.5–15.5)
WBC: 7.3 10*3/uL (ref 4.0–10.5)
nRBC: 0 % (ref 0.0–0.2)

## 2020-07-02 LAB — BASIC METABOLIC PANEL
Anion gap: 6 (ref 5–15)
BUN: 15 mg/dL (ref 8–23)
CO2: 24 mmol/L (ref 22–32)
Calcium: 8.8 mg/dL — ABNORMAL LOW (ref 8.9–10.3)
Chloride: 107 mmol/L (ref 98–111)
Creatinine, Ser: 0.86 mg/dL (ref 0.44–1.00)
GFR, Estimated: 60 mL/min — ABNORMAL LOW (ref >60)
Glucose, Bld: 94 mg/dL (ref 70–99)
Potassium: 3.8 mmol/L (ref 3.5–5.1)
Sodium: 137 mmol/L (ref 135–145)

## 2020-07-02 NOTE — TOC Transition Note (Signed)
Transition of Care [facility]) - CM/SW Discharge Note   Patient Details  Name: [name] MRN: [id number] Date of Birth: 08-Jun-1916  [facility] CM/SW Contact:  Trula Ore, [place] Phone Number: 07/02/2020, 12:44 PM   Clinical Narrative:     Patient will DC to: [facility]  Anticipated DC date: 07/02/2020  Family notified: [name]   Transport by: Corey Harold  ?  Per MD patient ready for DC to [facility] . RN, patient, patient's family, and facility notified of DC. Discharge Summary sent to facility. RN given number for report tele# [phone] RM#[id number]. DC packet on chart. DNR signed by MD attached to patients DC packet. Ambulance transport requested for patient.  CSW signing off.    Final next level of care: Skilled Nursing Facility Barriers to Discharge: No Barriers Identified   Patient Goals and CMS Choice Patient states their goals for this hospitalization and ongoing recovery are:: to go to SNF CMS Medicare.gov Compare Post Acute Care list provided to:: Patient Choice offered to / list presented to : Patient  Discharge Placement              Patient chooses bed at: [place] Patient to be transferred to facility by: [place] Name of family member notified: [name] Patient and family notified of of transfer: 07/02/20  Discharge Plan and Services In-house Referral: Clinical Social Work Discharge Planning Services: CM Consult                                 Social Determinants of Health ([place]) Interventions     Readmission Risk Interventions Readmission Risk Prevention Plan 05/04/2020  Transportation Screening Complete  PCP or Specialist Appt within 5-7 Days Complete  Home Care Screening Complete  Medication Review (RN CM) Complete  Some recent data might be hidden

## 2020-07-02 NOTE — Plan of Care (Signed)

## 2020-07-02 NOTE — Discharge Summary (Signed)
Physician Discharge Summary  [name] OHY:[id number] DOB: 1916-07-09 DOA: 06/29/2020  PCP: Leonard Downing, MD  Admit date: 06/29/2020 Discharge date: 07/02/2020  Admitted From: Home Disposition: SNF  Recommendations for Outpatient Follow-up:  1. Follow up with PCP in 1-2 weeks 2. Please obtain BMP/CBC in one week  Home Health: None Equipment/Devices: None  Discharge Condition: Stable CODE STATUS: DNR Diet recommendation: Regular diet as tolerated  Brief/Interim Summary: [name] a 87 y.o.femalewith medical history significant forCAD, paroxysmal atrial fibrillation not anticoagulated, chronic diastolic CHF, and insomnia, now presenting to the emergency department after a transient loss of consciousness at home. Patient lives home alone, just completed a course of Levaquin for UTI earlier today, no longer has urinary symptoms, but reports recent decreased appetite and decreased oral intake that she attributes to the antibiotic. She was seated today, reports that she stood up and took 1 or 2 steps, and then woke up on the ground and pressed her life alert button.  Patient is above with acute syncopal episode likely orthostatic in nature given her profound hypotension and symptoms with attempted orthostatic vital signs in the hospital.  Her symptoms resolved quite rapidly with IV fluids and increased p.o. intake.  Her AKI also resolved with supportive care and IV fluids.  Patient had moderate bradycardia, as such her metoprolol was held and intake, this too may have had some role in her symptoms.  Close follow-up with PCP and cardiology as scheduled for further evaluation and reinitiation of heart rate medications if necessary but her paroxysmal A. fib at this time remains well controlled if not somewhat bradycardic at rest. Patient otherwise evaluated by PT who recommended ongoing SNF placement for physical therapy and rehab.  Lengthy discussion with family about  recommendations for discharge from SNF to assisted living or to increase her home care given she lives alone at home and remains high risk for falls and repeat events without close monitoring.   Discharge Diagnoses:  Principal Problem:   Syncope Active Problems:   CAD (coronary artery disease)   PAF (paroxysmal atrial fibrillation) (HCC)   Chronic diastolic CHF (congestive heart failure) (HCC)   AKI (acute kidney injury) [facility])    Discharge Instructions  Discharge Instructions    Call MD for:  difficulty breathing, headache or visual disturbances   Complete by: As directed    Call MD for:  extreme fatigue   Complete by: As directed    Call MD for:  persistant dizziness or light-headedness   Complete by: As directed    Diet - low sodium heart healthy   Complete by: As directed    Increase activity slowly   Complete by: As directed      Allergies as of 07/02/2020      Reactions   Effexor [venlafaxine Hydrochloride] Nausea And Vomiting   Penicillins Shortness Of Breath   Has patient had a PCN reaction causing immediate rash, facial/tongue/throat swelling, SOB or lightheadedness with hypotension: Yes Has patient had a PCN reaction causing severe rash involving mucus membranes or skin necrosis: No Has patient had a PCN reaction that required hospitalization: No Has patient had a PCN reaction occurring within the last 10 years: No If all of the above answers are "NO", then may proceed with Cephalosporin use.   Sulfa Antibiotics Nausea Only   Dicyclomine Other (See Comments)   Caused confusion   Lactose Intolerance (gi) Diarrhea   Hydrocodone Nausea Only      Medication List    STOP  taking these medications   metoprolol succinate 25 MG 24 hr tablet Commonly known as: Toprol XL     TAKE these medications   acetaminophen 500 MG tablet Commonly known as: TYLENOL Take 2 tablets (1,000 mg total) by mouth every 6 (six) hours as needed for mild pain. What changed:   how much  to take  when to take this  reasons to take this   amiodarone 200 MG tablet Commonly known as: PACERONE 1 tab (200 mg total) by mouth twice daily for 1 week followed by 1 tab (200 mg total) by mouth daily thereafter. What changed:   how much to take  how to take this  when to take this  additional instructions   aspirin EC 81 MG tablet Take 81 mg by mouth every morning.   beta carotene w/minerals tablet Take 1 tablet by mouth every morning.   cholecalciferol 25 MCG (1000 UNIT) tablet Commonly known as: VITAMIN D3 Take 1,000 Units by mouth every morning.   clonazePAM 0.5 MG tablet Commonly known as: KLONOPIN Take 0.25 mg by mouth at bedtime.   furosemide 40 MG tablet Commonly known as: LASIX Take 20 mg by mouth every morning.   melatonin 5 MG Tabs Take 5 mg by mouth at bedtime.   nitroGLYCERIN 0.4 MG SL tablet Commonly known as: NITROSTAT Place 0.4 mg under the tongue every 5 (five) minutes as needed for chest pain.   nystatin 100000 UNIT/ML suspension Commonly known as: MYCOSTATIN Take 5 mLs by mouth 4 (four) times daily.   Polyethyl Glycol-Propyl Glycol 0.4-0.3 % Gel ophthalmic gel Commonly known as: SYSTANE Place 1 drop into both eyes at bedtime.   sertraline 25 MG tablet Commonly known as: ZOLOFT Take 25 mg by mouth at bedtime.       Allergies  Allergen Reactions  . Effexor [Venlafaxine Hydrochloride] Nausea And Vomiting  . Penicillins Shortness Of Breath    Has patient had a PCN reaction causing immediate rash, facial/tongue/throat swelling, SOB or lightheadedness with hypotension: Yes Has patient had a PCN reaction causing severe rash involving mucus membranes or skin necrosis: No Has patient had a PCN reaction that required hospitalization: No Has patient had a PCN reaction occurring within the last 10 years: No If all of the above answers are "NO", then may proceed with Cephalosporin use.   . Sulfa Antibiotics Nausea Only  . Dicyclomine  Other (See Comments)    Caused confusion  . Lactose Intolerance (Gi) Diarrhea  . Hydrocodone Nausea Only    Consultations:  None   Procedures/Studies: CT Head Wo Contrast  Result Date: 06/29/2020 CLINICAL DATA:  Fall, syncope EXAM: CT HEAD WITHOUT CONTRAST CT CERVICAL SPINE WITHOUT CONTRAST TECHNIQUE: Multidetector CT imaging of the head and cervical spine was performed following the standard protocol without intravenous contrast. Multiplanar CT image reconstructions of the cervical spine were also generated. COMPARISON:  May 24, 2020 FINDINGS: CT HEAD FINDINGS Brain: No evidence of acute large vascular territory infarction, hemorrhage, hydrocephalus, extra-axial collection or mass lesion/mass effect. Age related global parenchymal volume loss with ex vacuo dilatation of ventricular system. Similar severe burden of chronic small vessel ischemic disease. Vascular: No hyperdense vessel. Atherosclerotic calcifications of the internal carotid arteries at the skull base. Skull: . Negative for fracture or focal lesion. Sinuses/Orbits: Visualized paranasal sinuses and mastoid air cells are predominantly clear. Prior lens surgery. Other: None CT CERVICAL SPINE FINDINGS Alignment: No traumatic subluxation. Skull base and vertebrae: No acute fracture. No primary bone lesion or focal pathologic  process. Soft tissues and spinal canal: No prevertebral fluid or swelling. No visible canal hematoma. Disc levels: Multilevel degenerative change of the spine with disc space narrowing osteophytosis and facet/uncovertebral hypertrophy, which is moderate to advanced dense similar prior. Partial bony fusion across the C3-C4 and C4-C5 disc space is is also unchanged. Upper chest: Biapical pleuroparenchymal scarring. Other: Carotid artery calcifications IMPRESSION: 1. No evidence of acute intracranial abnormality. 2. Stable age related global parenchymal volume loss and severe chronic small vessel ischemic disease. 3. No  evidence of acute fracture or traumatic subluxation of the cervical spine. 4. Stable moderate to advanced multilevel degenerative change of the cervical spine. Electronically Signed   By: Dahlia Bailiff MD   On: 06/29/2020 20:34   CT Cervical Spine Wo Contrast  Result Date: 06/29/2020 CLINICAL DATA:  Fall, syncope EXAM: CT HEAD WITHOUT CONTRAST CT CERVICAL SPINE WITHOUT CONTRAST TECHNIQUE: Multidetector CT imaging of the head and cervical spine was performed following the standard protocol without intravenous contrast. Multiplanar CT image reconstructions of the cervical spine were also generated. COMPARISON:  May 24, 2020 FINDINGS: CT HEAD FINDINGS Brain: No evidence of acute large vascular territory infarction, hemorrhage, hydrocephalus, extra-axial collection or mass lesion/mass effect. Age related global parenchymal volume loss with ex vacuo dilatation of ventricular system. Similar severe burden of chronic small vessel ischemic disease. Vascular: No hyperdense vessel. Atherosclerotic calcifications of the internal carotid arteries at the skull base. Skull: . Negative for fracture or focal lesion. Sinuses/Orbits: Visualized paranasal sinuses and mastoid air cells are predominantly clear. Prior lens surgery. Other: None CT CERVICAL SPINE FINDINGS Alignment: No traumatic subluxation. Skull base and vertebrae: No acute fracture. No primary bone lesion or focal pathologic process. Soft tissues and spinal canal: No prevertebral fluid or swelling. No visible canal hematoma. Disc levels: Multilevel degenerative change of the spine with disc space narrowing osteophytosis and facet/uncovertebral hypertrophy, which is moderate to advanced dense similar prior. Partial bony fusion across the C3-C4 and C4-C5 disc space is is also unchanged. Upper chest: Biapical pleuroparenchymal scarring. Other: Carotid artery calcifications IMPRESSION: 1. No evidence of acute intracranial abnormality. 2. Stable age related global  parenchymal volume loss and severe chronic small vessel ischemic disease. 3. No evidence of acute fracture or traumatic subluxation of the cervical spine. 4. Stable moderate to advanced multilevel degenerative change of the cervical spine. Electronically Signed   By: Dahlia Bailiff MD   On: 06/29/2020 20:34   DG Chest Portable 1 View  Result Date: 06/29/2020 CLINICAL DATA:  Status post fall. EXAM: PORTABLE CHEST 1 VIEW COMPARISON:  June 11, 2020 FINDINGS: The lungs are hyperinflated. Mild, diffuse, chronic appearing increased interstitial lung markings are seen. There is no evidence of focal consolidation, pleural effusion or pneumothorax. The cardiac silhouette is mildly enlarged and unchanged in appearance. Moderate to marked severity calcification of the aortic arch is seen. A chronic deformity is seen involving the left humeral head and neck. IMPRESSION: Stable exam without acute or active cardiopulmonary disease. Electronically Signed   By: Virgina Norfolk M.D.   On: 06/29/2020 20:09   DG Chest Portable 1 View  Result Date: 06/11/2020 CLINICAL DATA:  4 day history of fatigue. EXAM: PORTABLE CHEST 1 VIEW COMPARISON:  05/03/2020 FINDINGS: 1430 hours. Lungs are hyperexpanded. The cardio pericardial silhouette is enlarged. Valvular annular calcification over the left heart is probably mitral valve related. Interstitial markings are diffusely coarsened with chronic features. The lungs are clear without focal pneumonia, edema, pneumothorax or pleural effusion. The visualized bony structures of  the thorax show no acute abnormality. Telemetry leads overlie the chest. IMPRESSION: Hyperexpansion without acute cardiopulmonary findings. Electronically Signed   By: Misty Stanley M.D.   On: 06/11/2020 15:01      Subjective: No acute issues or events overnight denies nausea vomiting diarrhea constipation headache fevers or chills   Discharge Exam: Vitals:   07/02/20 0048 07/02/20 0415  BP: (!) 128/48 (!)  127/50  Pulse: (!) 57 (!) 56  Resp: 20 (!) 22  Temp: 99.1 F (37.3 C) 98.9 F (37.2 C)  SpO2:  95%   Vitals:   07/01/20 2013 07/02/20 0015 07/02/20 0048 07/02/20 0415  BP: (!) 165/63 (!) 139/50 (!) 128/48 (!) 127/50  Pulse: (!) 55  (!) 57 (!) 56  Resp: 20 19 20  (!) 22  Temp: 99.1 F (37.3 C)  99.1 F (37.3 C) 98.9 F (37.2 C)  TempSrc: Oral  Oral Oral  SpO2: 93% 94%  95%  Weight:        General: Pt is alert, awake, not in acute distress Cardiovascular: RRR, S1/S2 +, no rubs, no gallops Respiratory: CTA bilaterally, no wheezing, no rhonchi Abdominal: Soft, NT, ND, bowel sounds + Extremities: no edema, no cyanosis    The results of significant diagnostics from this hospitalization (including imaging, microbiology, ancillary and laboratory) are listed below for reference.     Microbiology: Recent Results (from the past 240 hour(s))  SARS CORONAVIRUS 2 (TAT 6-24 HRS) Nasopharyngeal Nasopharyngeal Swab     Status: None   Collection Time: 06/29/20 11:10 PM   Specimen: Nasopharyngeal Swab  Result Value Ref Range Status   SARS Coronavirus 2 NEGATIVE NEGATIVE Final    Comment: (NOTE) SARS-CoV-2 target nucleic acids are NOT DETECTED.  The SARS-CoV-2 RNA is generally detectable in upper and lower respiratory specimens during the acute phase of infection. Negative results do not preclude SARS-CoV-2 infection, do not rule out co-infections with other pathogens, and should not be used as the sole basis for treatment or other patient management decisions. Negative results must be combined with clinical observations, patient history, and epidemiological information. The expected result is Negative.  Fact Sheet for Patients: SugarRoll.be  Fact Sheet for Healthcare Providers: https://www.woods-mathews.com/  This test is not yet approved or cleared by the [place] FDA and  has been authorized for detection and/or diagnosis of  SARS-CoV-2 by FDA under an Emergency Use Authorization (EUA). This EUA will remain  in effect (meaning this test can be used) for the duration of the COVID-19 declaration under Se ction 564(b)(1) of the Act, 21 U.S.C. section 360bbb-3(b)(1), unless the authorization is terminated or revoked sooner.  Performed at [facility], [place] [address] [place], [place] [address]      Labs: BNP (last 3 results) Recent Labs    04/30/20 1932 06/11/20 1355  BNP 369.2* 532.9*   Basic Metabolic Panel: Recent Labs  Lab 06/29/20 1928 06/30/20 0221 07/01/20 0200 07/02/20 0252  NA 136 136 136 137  K 4.7 4.1 4.1 3.8  CL 99 103 107 107  CO2 28 25 23 24   GLUCOSE 93 162* 98 94  BUN 28* 23 17 15   CREATININE 1.22* 1.14* 0.95 0.86  CALCIUM 10.2 9.1 8.9 8.8*   Liver Function Tests: Recent Labs  Lab 06/29/20 1928  AST 30  ALT 31  ALKPHOS 100  BILITOT 0.9  PROT 7.9  ALBUMIN 3.6   No results for input(s): LIPASE, AMYLASE in the last 168 hours. No results for input(s): AMMONIA in the last  168 hours. CBC: Recent Labs  Lab 06/29/20 1928 06/30/20 0221 07/01/20 0200 07/02/20 0252  WBC 9.0 9.7 7.7 7.3  NEUTROABS 6.1  --   --   --   HGB 13.7 11.8* 11.5* 10.7*  HCT 42.2 37.4 35.8* 32.6*  MCV 95.0 95.9 93.2 92.6  PLT 229 188 175 166   Cardiac Enzymes: No results for input(s): CKTOTAL, CKMB, CKMBINDEX, TROPONINI in the last 168 hours. BNP: Invalid input(s): POCBNP CBG: Recent Labs  Lab 06/30/20 0615 06/30/20 1214  GLUCAP 98 94   D-Dimer No results for input(s): DDIMER in the last 72 hours. Hgb A1c No results for input(s): HGBA1C in the last 72 hours. Lipid Profile No results for input(s): CHOL, HDL, LDLCALC, TRIG, CHOLHDL, LDLDIRECT in the last 72 hours. Thyroid function studies No results for input(s): TSH, T4TOTAL, T3FREE, THYROIDAB in the last 72 hours.  Invalid input(s): FREET3 Anemia work up No results for input(s): VITAMINB12, FOLATE, FERRITIN, TIBC, IRON,  RETICCTPCT in the last 72 hours. Urinalysis    Component Value Date/Time   COLORURINE YELLOW 06/29/2020 2042   APPEARANCEUR CLEAR 06/29/2020 2042   LABSPEC 1.012 06/29/2020 2042   PHURINE 5.0 06/29/2020 2042   GLUCOSEU NEGATIVE 06/29/2020 2042   HGBUR NEGATIVE 06/29/2020 2042   BILIRUBINUR NEGATIVE 06/29/2020 2042   KETONESUR NEGATIVE 06/29/2020 2042   PROTEINUR NEGATIVE 06/29/2020 2042   UROBILINOGEN 0.2 10/01/2014 1628   NITRITE NEGATIVE 06/29/2020 2042   LEUKOCYTESUR TRACE (A) 06/29/2020 2042   Sepsis Labs Invalid input(s): PROCALCITONIN,  WBC,  LACTICIDVEN Microbiology Recent Results (from the past 240 hour(s))  SARS CORONAVIRUS 2 (TAT 6-24 HRS) Nasopharyngeal Nasopharyngeal Swab     Status: None   Collection Time: 06/29/20 11:10 PM   Specimen: Nasopharyngeal Swab  Result Value Ref Range Status   SARS Coronavirus 2 NEGATIVE NEGATIVE Final    Comment: (NOTE) SARS-CoV-2 target nucleic acids are NOT DETECTED.  The SARS-CoV-2 RNA is generally detectable in upper and lower respiratory specimens during the acute phase of infection. Negative results do not preclude SARS-CoV-2 infection, do not rule out co-infections with other pathogens, and should not be used as the sole basis for treatment or other patient management decisions. Negative results must be combined with clinical observations, patient history, and epidemiological information. The expected result is Negative.  Fact Sheet for Patients: SugarRoll.be  Fact Sheet for Healthcare Providers: https://www.woods-mathews.com/  This test is not yet approved or cleared by the [place] FDA and  has been authorized for detection and/or diagnosis of SARS-CoV-2 by FDA under an Emergency Use Authorization (EUA). This EUA will remain  in effect (meaning this test can be used) for the duration of the COVID-19 declaration under Se ction 564(b)(1) of the Act, 21 U.S.C. section  360bbb-3(b)(1), unless the authorization is terminated or revoked sooner.  Performed at [facility], [place] [address], [place],  [address]      Time coordinating discharge: Over 30 minutes  SIGNED:   Little Ishikawa, DO [facility] 07/02/2020, 10:45 AM Pager   If 7PM-7AM, please contact night-coverage [email or web address]

## 2020-07-02 NOTE — Progress Notes (Signed)
Pt dc to rehab. Vss. No acute distress noted. poc passed on to transport.

## 2020-07-02 NOTE — Progress Notes (Signed)
Physical Therapy Treatment Patient Details Name: [name] MRN: [id number] DOB: 1916-12-27 Today's Date: 07/02/2020    History of Present Illness Pt is a 87 y.o. female admitted 06/29/20 with syncopal episode, pt woke up on floor and pressed her life alert button. Workup for AKI, likely orthostatic in setting of hypovolemia. EKG features sinus or ectopic atrial rhythm with first-degree AV nodal block and nonspecific IVCD. Head CT negative for acute intracranial abnormality. PMH includes PAF, CHF, CAD, rectal CA s/p colostomy, HTN, MI, osteopenia, HOH. Of note, recent admission 05/2020 with SOB, weakness.    PT Comments    Pt sitting EOB finishing up breakfast upon PT arrival to room. Pt eager to mobilize with PT, states "anything to make me stronger". Pt ambulatory for short room distances today, overall requiring min assist and fatigues very quickly requiring extended seated rest breaks. Pt still agreeable to SNF to get stronger to return home. Pt plans to d/c today.    Follow Up Recommendations  SNF;Supervision for mobility/OOB     Equipment Recommendations  None recommended by PT    Recommendations for Other Services       Precautions / Restrictions Precautions Precautions: Fall;Other (comment) Precaution Comments: colostomy Restrictions Weight Bearing Restrictions: No    Mobility  Bed Mobility Overal bed mobility: Needs Assistance             General bed mobility comments: pt sitting EOB eating breakfast upon PT arrival to room.    Transfers Overall transfer level: Needs assistance Equipment used: Rolling walker (2 wheeled) Transfers: Sit to/from [facility] Sit to Stand: Min assist Stand pivot transfers: Min assist       General transfer comment: Min assist for rise, steady, and pivot to recliner towards pt L. STS x3 during session from EOB x1 and recliner x2.  Ambulation/Gait Ambulation/Gait assistance: Min assist Gait Distance (Feet):  5 Feet (x2) Assistive device: Rolling walker (2 wheeled) Gait Pattern/deviations: Step-through pattern;Decreased stride length;Trunk flexed;Narrow base of support Gait velocity: decr   General Gait Details: Min assist to steady, guide RW. Verbal cuing for closer proximity to RW, upright posture. Seated rest break x2 minutes after first 5-ft bout of gait.   Stairs             Wheelchair Mobility    Modified Rankin (Stroke Patients Only)       Balance Overall balance assessment: Needs assistance   Sitting balance-Leahy Scale: Fair     Standing balance support: Bilateral upper extremity supported Standing balance-Leahy Scale: Poor Standing balance comment: Reliant on UE support                            Cognition Arousal/Alertness: Awake/alert Behavior During Therapy: WFL for tasks assessed/performed Overall Cognitive Status: Within Functional Limits for tasks assessed                                 General Comments: Pt repeating stories about family to PT x2 during session, pt appears unaware. Very pleasant and motivated      Exercises      General Comments General comments (skin integrity, edema, etc.): vss      Pertinent Vitals/Pain Pain Assessment: No/denies pain Pain Intervention(s): Monitored during session    Home Living  Prior Function            PT Goals (current goals can now be found in the care plan section) Acute Rehab PT Goals Patient Stated Goal: get stronger PT Goal Formulation: With patient Time For Goal Achievement: 07/15/20 Potential to Achieve Goals: Good Progress towards PT goals: Progressing toward goals    Frequency    Min 3X/week      PT Plan Current plan remains appropriate    Co-evaluation              AM-PAC PT "6 Clicks" Mobility   Outcome Measure  Help needed turning from your back to your side while in a flat bed without using bedrails?: A  Little Help needed moving from lying on your back to sitting on the side of a flat bed without using bedrails?: A Little Help needed moving to and from a bed to a chair (including a wheelchair)?: A Little Help needed standing up from a chair using your arms (e.g., wheelchair or bedside chair)?: A Little Help needed to walk in hospital room?: A Little Help needed climbing 3-5 steps with a railing? : A Lot 6 Click Score: 17    End of Session   Activity Tolerance: Patient tolerated treatment well;Patient limited by fatigue Patient left: in chair;with call bell/phone within reach;with chair alarm set Nurse Communication: Mobility status PT Visit Diagnosis: Other abnormalities of gait and mobility (R26.89);Muscle weakness (generalized) (M62.81)     Time: 1059-1130 PT Time Calculation (min) (ACUTE ONLY): 31 min  Charges:  $Gait Training: 8-22 mins $Therapeutic Activity: 8-22 mins                     Stacie Glaze, PT DPT Acute Rehabilitation Services Pager [phone]  Office [phone]   [place] 07/02/2020, 11:51 AM

## 2020-07-19 ENCOUNTER — Encounter: Payer: Self-pay | Admitting: Cardiovascular Disease

## 2020-07-19 ENCOUNTER — Other Ambulatory Visit: Payer: Self-pay

## 2020-07-19 ENCOUNTER — Ambulatory Visit (INDEPENDENT_AMBULATORY_CARE_PROVIDER_SITE_OTHER): Payer: Medicare PPO | Admitting: Cardiovascular Disease

## 2020-07-19 VITALS — BP 110/60 | HR 62 | Ht 63.0 in | Wt 97.2 lb

## 2020-07-19 DIAGNOSIS — I10 Essential (primary) hypertension: Secondary | ICD-10-CM

## 2020-07-19 DIAGNOSIS — I480 Paroxysmal atrial fibrillation: Secondary | ICD-10-CM | POA: Diagnosis not present

## 2020-07-19 DIAGNOSIS — I351 Nonrheumatic aortic (valve) insufficiency: Secondary | ICD-10-CM | POA: Diagnosis not present

## 2020-07-19 DIAGNOSIS — I2510 Atherosclerotic heart disease of native coronary artery without angina pectoris: Secondary | ICD-10-CM | POA: Diagnosis not present

## 2020-07-19 DIAGNOSIS — I5032 Chronic diastolic (congestive) heart failure: Secondary | ICD-10-CM

## 2020-07-19 NOTE — Progress Notes (Signed)
Chief Complaint  Patient presents with  . Follow-up    CAD   History of Present Illness: 87 yo female with history of CAD, aortic valve insufficiency, mitral regurgitation, rectal cancer and atrial fib/flutter who is here today for cardiac follow up. She was admitted to [facility] July 2012 with an inferior STEMI and was found to have a total occlusion of the proximal RCA which was treated with a bare metal stent. There was moderate residual disease in the mid RCA and severe disease in the proximal Circumflex artery. A bare metal stent was placed in the circumflex artery in a staged procedure. Echo March 2016 with normal LV function, moderate AI, mild MR. She is known to have SVT, PVCs and PACs documented by cardiac monitor in 2013. She was found to have atrial flutter in September 2017. Anti-coagulation was not started given advanced age and fall risk. She has been on ASA and a beta blocker. She was admitted with a probable TIA in January 2018 and has recovered neurologically. Echo January 2019 with normal LV systolic function, IOEV=03-50%. There was grade 3 diastolic dysfunction and mild to moderate aortic valve insufficiency with trivial MR. She was seen in the ED on 05/12/17 with chest pain. Troponin was negative. She had no ischemic EKG changes. She was not admitted. She has had severe fatigue and dyspnea for the years.  Some improvement on Lasix. She fell in July 2021 and fractured her pelvis and shoulder. She was in a nursing home for 2 months. Started on amiodarone while in the nursing home. Echo March 2022 with LVEF=60-65%, mild LVH, moderate mitral regurgitation, mild to moderate aortic insufficiency, severe tricuspid regurgitation. She was admitted to [facility] April 2022 with atrial fib with RVR and converted to sinus with IV amiodarone. She was admitted to [facility] again 07/04/20 after a syncopal event at home while being treated for a UTI.   She is here today for follow up. The patient denies any chest  pain, palpitations, lower extremity edema, orthopnea, PND, dizziness, near syncope or syncope. She continues to have dyspnea. No changes.   Primary Care Physician: Leonard Downing, MD  Past Medical History:  Diagnosis Date  . Arthritis    FINGERS   . CAD (coronary artery disease) CARDIOLOGIST-- DR Angelena Form   a. s/p INF STEMI 7/12: tx with BMS to RCA;  b. cath 08/26/10: pLAD 30%, mLAD 50%, D1 40%, pCFX 95%, mRCA occluded;   c. staged PCI of pCFX with BMS;   d. echo 7/12:   EF 60-65%, mild RAE, mild to moderate AI, mild MR, moderate TR, RVE, PASP 47  . Complication of anesthesia PT STATES "MADE HER FEEL CRAZY"  . Degeneration of eye    left eye cornea  . Dyspnea   . First degree heart block   . Heart palpitations PAC'S AND SVT RUN'S  PER CARDIOLOGIST NOTE  . History of ST elevation myocardial infarction (STEMI) 08-26-2010-- INFERIOR WALL   S/P PCI  BMS IN RCA AND PROX. CX  . Hyperlipidemia   . Hypertension   . Impaired hearing BILATERAL HEARING AIDS  . Osteopenia   . PAF (paroxysmal atrial fibrillation) ([place])   . PONV (postoperative nausea and vomiting)   . Pulmonary nodules BENIGN  PER CT  10-12-2010  . Rectal Cancer 08/2010   adenocarcinoma   S/P PARTIAL PROCTECTOMY (NO CHEMO OR RADIATION)  . 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  S/P coronary artery stent placement 08/2010   X2  BM    Past Surgical History:  Procedure Laterality Date  . ABDOMINAL HYSTERECTOMY  1950's   AND APPENDECTOMY  . CATARACT EXTRACTION W/ INTRAOCULAR LENS  IMPLANT, BILATERAL    . CORONARY ANGIOPLASTY WITH STENT PLACEMENT  08-26-2010  DR [place]   PCI, BM STENT IN RCA  . CORONARY ANGIOPLASTY WITH STENT PLACEMENT  08-29-2010  DR [place]   PCI, BM STENT IN PROXIMAL CIRCUMFLEX  . EXCISION BENIGN CYST RIGHT BREAST    . FISTULA PLUG N/A 06/21/2012   Procedure: insertion of FISTULA PLUG;  Surgeon: Leighton Ruff, MD;   Location: [facility];  Service: General;  Laterality: N/A;  . FLEXIBLE SIGMOIDOSCOPY N/A 04/02/2012   Procedure: FLEXIBLE SIGMOIDOSCOPY;  Surgeon: Leighton Ruff, MD;  Location: [facility];  Service: Endoscopy;  Laterality: N/A;  . KNEE SURGERY Right 1996  . LAPROSCOPY LYSIS ADHESIONS/ DRAINAGE OF PELVIC ABSCESS/ DIVERTING LOOP SIGMOID COLECTOMY  11-22-2010   POST OP RECTOVAGINAL FISTULA  . PARTIAL PROCTECTOMY BY TEM  11-17-2010   RECTAL CANCER  . RELEASE LEFT CARPAL TUNNEL/ OSTEOTOMY LEFT DISTAL RADIUS  11-10-2009  . STAPEDECTOMY  1970'S  . TONSILLECTOMY  CHILD  . TOTAL HIP ARTHROPLASTY Right 1992  . TRANSTHORACIC ECHOCARDIOGRAM  10-10-2011   NORMAL LV SIZE WITH MILD FOCAL BASAL SEPTAL HYPERTROPHY/ EF 55-60%/ NORMAL RV SIZE AND LVSF/ BIATRIAL ENLARGEMENT/ MILD TO MODERATE AI  &  TR  . TYMPANOPLASTY Left 12-23-2009    Current Outpatient Medications  Medication Sig Dispense Refill  . acetaminophen (TYLENOL) 500 MG tablet Take 2 tablets (1,000 mg total) by mouth every 6 (six) hours as needed for mild pain. 30 tablet 0  . amiodarone (PACERONE) 200 MG tablet 1 tab (200 mg total) by mouth twice daily for 1 week followed by 1 tab (200 mg total) by mouth daily thereafter. 90 tablet 3  . aspirin EC 81 MG tablet Take 81 mg by mouth every morning.    . beta carotene w/minerals (OCUVITE) tablet Take 1 tablet by mouth every morning.    . cholecalciferol (VITAMIN D3) 25 MCG (1000 UNIT) tablet Take 1,000 Units by mouth every morning.    . clonazePAM (KLONOPIN) 0.5 MG tablet Take 0.25 mg by mouth at bedtime.    . furosemide (LASIX) 40 MG tablet Take 20 mg by mouth every morning.    . melatonin 5 MG TABS Take 5 mg by mouth at bedtime.    . nitroGLYCERIN (NITROSTAT) 0.4 MG SL tablet Place 0.4 mg under the tongue every 5 (five) minutes as needed for chest pain.    Vladimir Faster Glycol-Propyl Glycol (SYSTANE) 0.4-0.3 % GEL ophthalmic gel Place 1 drop into both eyes at bedtime.     .  sertraline (ZOLOFT) 25 MG tablet Take 25 mg by mouth at bedtime.     No current facility-administered medications for this visit.    Allergies  Allergen Reactions  . Effexor [Venlafaxine Hydrochloride] Nausea And Vomiting  . Penicillins Shortness Of Breath    Has patient had a PCN reaction causing immediate rash, facial/tongue/throat swelling, SOB or lightheadedness with hypotension: Yes Has patient had a PCN reaction causing severe rash involving mucus membranes or skin necrosis: No Has patient had a PCN reaction that required hospitalization: No Has patient had a PCN reaction occurring within the last 10 years: No If all of the above answers are "NO", then may proceed with Cephalosporin use.   . Sulfa Antibiotics Nausea Only  .  Dicyclomine Other (See Comments)    Caused confusion  . Lactose Intolerance (Gi) Diarrhea  . Hydrocodone Nausea Only    Social History   Socioeconomic History  . Marital status: Widowed    Spouse name: Not on file  . Number of children: Not on file  . Years of education: Not on file  . Highest education level: Not on file  Occupational History  . Not on file  Tobacco Use  . Smoking status: Never Smoker  . Smokeless tobacco: Never Used  Substance and Sexual Activity  . Alcohol use: No  . Drug use: No  . Sexual activity: Not on file  Other Topics Concern  . Not on file  Social History Narrative  . Not on file   Social Determinants of Health   Financial Resource Strain: Not on file  Food Insecurity: Not on file  Transportation Needs: Not on file  Physical Activity: Not on file  Stress: Not on file  Social Connections: Not on file  Intimate Partner Violence: Not on file    Family History  Problem Relation Age of Onset  . Kidney disease Mother   . Heart disease Father   . Cancer Brother     Review of Systems:  As stated in the HPI and otherwise negative.   BP 110/60   Pulse 62   Ht 5\' 3"  (1.6 m)   Wt 97 lb 3.2 oz (44.1 kg)    SpO2 98%   BMI 17.22 kg/m   Physical Examination:  General: Well developed, well nourished, NAD  HEENT: OP clear, mucus membranes moist  SKIN: warm, dry. No rashes. Neuro: No focal deficits  Musculoskeletal: Muscle strength 5/5 all ext  Psychiatric: Mood and affect normal  Neck: No JVD, no carotid bruits, no thyromegaly, no lymphadenopathy.  Lungs:Clear bilaterally, no wheezes, rhonci, crackles Cardiovascular: Regular rate and rhythm. Systolic murmur.  Abdomen:Soft. Bowel sounds present. Non-tender.  Extremities: No lower extremity edema. Pulses are 2 + in the bilateral DP/PT.  Echo March 2022: 1. Left ventricular ejection fraction, by estimation, is 60 to 65%. The  left ventricle has normal function. The left ventricle has no regional  wall motion abnormalities. There is mild concentric left ventricular  hypertrophy. Left ventricular diastolic  function could not be evaluated. There is the interventricular septum is  flattened in systole and diastole, consistent with right ventricular  pressure and volume overload.  2. Right ventricular systolic function is normal. The right ventricular  size is mildly enlarged. There is mildly elevated pulmonary artery  systolic pressure. The estimated right ventricular systolic pressure is  12.7 mmHg.  3. Left atrial size was moderately dilated.  4. Right atrial size was severely dilated.  5. The mitral valve is normal in structure. Moderate mitral valve  regurgitation. No evidence of mitral stenosis. Severe mitral annular  calcification.  6. Tricuspid valve regurgitation is severe.  7. The aortic valve is normal in structure. There is mild calcification  of the aortic valve. There is mild thickening of the aortic valve. Aortic  valve regurgitation is mild to moderate. Mild to moderate aortic valve  sclerosis/calcification is present,  without any evidence of aortic stenosis.  8. The inferior vena cava is dilated in size with <50%  respiratory  variability, suggesting right atrial pressure of 15 mmHg.  9. Evidence of atrial level shunting detected by color flow Doppler.  There is a small secundum atrial septal defect with predominantly left to  right shunting across the atrial septum.  EKG:  EKG is not ordered today. The ekg ordered today demonstrates  Recent Labs: 06/11/2020: B Natriuretic Peptide 266.2; Magnesium 2.1; TSH 11.118 06/29/2020: ALT 31 07/02/2020: BUN 15; Creatinine, Ser 0.86; Hemoglobin 10.7; Platelets 166; Potassium 3.8; Sodium 137   Lipid Panel    Component Value Date/Time   CHOL 191 02/23/2017 0503   TRIG 77 02/23/2017 0503   HDL 67 02/23/2017 0503   CHOLHDL 2.9 02/23/2017 0503   VLDL 15 02/23/2017 0503   LDLCALC 109 (H) 02/23/2017 0503     Wt Readings from Last 3 Encounters:  07/19/20 97 lb 3.2 oz (44.1 kg)  07/01/20 100 lb 1.4 oz (45.4 kg)  06/12/20 110 lb 0.2 oz (49.9 kg)     Other studies Reviewed: Additional studies/ records that were reviewed today include: . Review of the above records demonstrates:   Assessment and Plan:   1. CAD without angina: She has no chest pain. She has chronic dyspnea but due to advanced age is not felt to be a good cath candidate. Continue ASA.   2. Atrial flutter, paroxysmal: Sinus on exam today. We have elected not to start anti-coagulation given her advanced age. Continue amiodarone.   3. HTN: BP is controlled. Continue current meds.   4. Aortic insufficiency/Mitral regurgitation: Mild to moderate AI and moderate MR by echo March 2022.   5. Dyspnea: This has been persistent for years. No volume overload. Her aortic valve insufficiency is mild to moderate and her MR is moderate and likely not contributory.   6 Chronic diastolic CHF:  No volume overload on exam. Weight is stable. Continue Lasix.   Current medicines are reviewed at length with the patient today.  The patient does not have concerns regarding medicines.  The following changes  have been made:  no change  Labs/ tests ordered today include:  No orders of the defined types were placed in this encounter.   Disposition:   FU with me in 6 months  Signed, Lauree Chandler, MD 07/19/2020 11:18 AM    [address], [place], [place]  [address] Phone: [phone]; Fax: [phone]

## 2020-07-19 NOTE — Patient Instructions (Signed)
Medication Instructions:  No changes *If you need a refill on your cardiac medications before your next appointment, please call your pharmacy*   Lab Work: none If you have labs (blood work) drawn today and your tests are completely normal, you will receive your results only by: . MyChart Message (if you have MyChart) OR . A paper copy in the mail If you have any lab test that is abnormal or we need to change your treatment, we will call you to review the results.   Testing/Procedures: none   Follow-Up: At [facility], you and your health needs are our priority.  As part of our continuing mission to provide you with exceptional heart care, we have created designated Provider Care Teams.  These Care Teams include your primary Cardiologist (physician) and Advanced Practice Providers (APPs -  Physician Assistants and Nurse Practitioners) who all work together to provide you with the care you need, when you need it.  We recommend signing up for the patient portal called "MyChart".  Sign up information is provided on this After Visit Summary.  MyChart is used to connect with patients for Virtual Visits (Telemedicine).  Patients are able to view lab/test results, encounter notes, upcoming appointments, etc.  Non-urgent messages can be sent to your provider as well.   To learn more about what you can do with MyChart, go to [email or web address].    Your next appointment:   6 month(s)  The format for your next appointment:   In Person  Provider:   You may see Christopher McAlhany, MD or one of the following Advanced Practice Providers on your designated Care Team:    Dayna Dunn, PA-C  Michele Lenze, PA-C  Other Instructions   

## 2020-07-31 ENCOUNTER — Emergency Department (HOSPITAL_COMMUNITY)
Admission: EM | Admit: 2020-07-31 | Discharge: 2020-08-01 | Disposition: A | Discharge status: Home / Self Care | Payer: Medicare PPO | Attending: Emergency Medicine | Admitting: Emergency Medicine

## 2020-07-31 ENCOUNTER — Other Ambulatory Visit: Payer: Self-pay

## 2020-07-31 ENCOUNTER — Emergency Department (HOSPITAL_COMMUNITY): Payer: Medicare PPO

## 2020-07-31 ENCOUNTER — Encounter (HOSPITAL_COMMUNITY): Payer: Self-pay | Admitting: Emergency Medicine

## 2020-07-31 DIAGNOSIS — Z96641 Presence of right artificial hip joint: Secondary | ICD-10-CM | POA: Diagnosis not present

## 2020-07-31 DIAGNOSIS — S60211A Contusion of right wrist, initial encounter: Secondary | ICD-10-CM | POA: Diagnosis not present

## 2020-07-31 DIAGNOSIS — R55 Syncope and collapse: Secondary | ICD-10-CM | POA: Insufficient documentation

## 2020-07-31 DIAGNOSIS — I5032 Chronic diastolic (congestive) heart failure: Secondary | ICD-10-CM | POA: Insufficient documentation

## 2020-07-31 DIAGNOSIS — Z79899 Other long term (current) drug therapy: Secondary | ICD-10-CM | POA: Diagnosis not present

## 2020-07-31 DIAGNOSIS — W19XXXA Unspecified fall, initial encounter: Secondary | ICD-10-CM | POA: Diagnosis not present

## 2020-07-31 DIAGNOSIS — Z955 Presence of coronary angioplasty implant and graft: Secondary | ICD-10-CM | POA: Insufficient documentation

## 2020-07-31 DIAGNOSIS — B9689 Other specified bacterial agents as the cause of diseases classified elsewhere: Secondary | ICD-10-CM | POA: Insufficient documentation

## 2020-07-31 DIAGNOSIS — Z85048 Personal history of other malignant neoplasm of rectum, rectosigmoid junction, and anus: Secondary | ICD-10-CM | POA: Diagnosis not present

## 2020-07-31 DIAGNOSIS — I2510 Atherosclerotic heart disease of native coronary artery without angina pectoris: Secondary | ICD-10-CM | POA: Insufficient documentation

## 2020-07-31 DIAGNOSIS — Y92009 Unspecified place in unspecified non-institutional (private) residence as the place of occurrence of the external cause: Secondary | ICD-10-CM | POA: Diagnosis not present

## 2020-07-31 DIAGNOSIS — S6991XA Unspecified injury of right wrist, hand and finger(s), initial encounter: Secondary | ICD-10-CM | POA: Diagnosis present

## 2020-07-31 DIAGNOSIS — Z923 Personal history of irradiation: Secondary | ICD-10-CM | POA: Diagnosis not present

## 2020-07-31 DIAGNOSIS — I110 Hypertensive heart disease with heart failure: Secondary | ICD-10-CM | POA: Diagnosis not present

## 2020-07-31 DIAGNOSIS — R42 Dizziness and giddiness: Secondary | ICD-10-CM | POA: Diagnosis not present

## 2020-07-31 DIAGNOSIS — Z7982 Long term (current) use of aspirin: Secondary | ICD-10-CM | POA: Diagnosis not present

## 2020-07-31 DIAGNOSIS — N390 Urinary tract infection, site not specified: Secondary | ICD-10-CM | POA: Insufficient documentation

## 2020-07-31 DIAGNOSIS — R000 Tachycardia, unspecified: Secondary | ICD-10-CM | POA: Insufficient documentation

## 2020-07-31 DIAGNOSIS — T1490XA Injury, unspecified, initial encounter: Secondary | ICD-10-CM

## 2020-07-31 LAB — URINALYSIS, ROUTINE W REFLEX MICROSCOPIC
Bilirubin Urine: NEGATIVE
Glucose, UA: NEGATIVE mg/dL
Hgb urine dipstick: NEGATIVE
Ketones, ur: NEGATIVE mg/dL
Nitrite: NEGATIVE
Protein, ur: NEGATIVE mg/dL
Specific Gravity, Urine: 1.009 (ref 1.005–1.030)
pH: 6 (ref 5.0–8.0)

## 2020-07-31 LAB — CBC WITH DIFFERENTIAL/PLATELET
Abs Immature Granulocytes: 0.02 10*3/uL (ref 0.00–0.07)
Basophils Absolute: 0 10*3/uL (ref 0.0–0.1)
Basophils Relative: 0 %
Eosinophils Absolute: 0.1 10*3/uL (ref 0.0–0.5)
Eosinophils Relative: 1 %
HCT: 38.5 % (ref 36.0–46.0)
Hemoglobin: 12.4 g/dL (ref 12.0–15.0)
Immature Granulocytes: 0 %
Lymphocytes Relative: 26 %
Lymphs Abs: 1.8 10*3/uL (ref 0.7–4.0)
MCH: 31 pg (ref 26.0–34.0)
MCHC: 32.2 g/dL (ref 30.0–36.0)
MCV: 96.3 fL (ref 80.0–100.0)
Monocytes Absolute: 0.5 10*3/uL (ref 0.1–1.0)
Monocytes Relative: 7 %
Neutro Abs: 4.5 10*3/uL (ref 1.7–7.7)
Neutrophils Relative %: 66 %
Platelets: 190 10*3/uL (ref 150–400)
RBC: 4 MIL/uL (ref 3.87–5.11)
RDW: 15.1 % (ref 11.5–15.5)
WBC: 7 10*3/uL (ref 4.0–10.5)
nRBC: 0 % (ref 0.0–0.2)

## 2020-07-31 LAB — BASIC METABOLIC PANEL
Anion gap: 7 (ref 5–15)
BUN: 20 mg/dL (ref 8–23)
CO2: 28 mmol/L (ref 22–32)
Calcium: 9.2 mg/dL (ref 8.9–10.3)
Chloride: 99 mmol/L (ref 98–111)
Creatinine, Ser: 1.13 mg/dL — ABNORMAL HIGH (ref 0.44–1.00)
GFR, Estimated: 43 mL/min — ABNORMAL LOW (ref >60)
Glucose, Bld: 92 mg/dL (ref 70–99)
Potassium: 4.1 mmol/L (ref 3.5–5.1)
Sodium: 134 mmol/L — ABNORMAL LOW (ref 135–145)

## 2020-07-31 LAB — TROPONIN I (HIGH SENSITIVITY): Troponin I (High Sensitivity): 19 ng/L — ABNORMAL HIGH (ref <18)

## 2020-07-31 MED ORDER — SODIUM CHLORIDE 0.9 % IV SOLN
1.0000 g | Freq: Once | INTRAVENOUS | Status: AC
  Administered 2020-07-31: 1 g via INTRAVENOUS
  Filled 2020-07-31: qty 10

## 2020-07-31 MED ORDER — SODIUM CHLORIDE 0.9 % IV BOLUS
500.0000 mL | Freq: Once | INTRAVENOUS | Status: AC
  Administered 2020-07-31: 500 mL via INTRAVENOUS

## 2020-07-31 MED ORDER — CEPHALEXIN 500 MG PO CAPS: 500.0000 mg | ORAL_CAPSULE | Freq: Two times a day (BID) | ORAL | 0 refills | Status: AC

## 2020-07-31 NOTE — ED Notes (Signed)
Patient transported to CT 

## 2020-07-31 NOTE — ED Provider Notes (Signed)
[facility] EMERGENCY DEPARTMENT Provider Note   CSN: [id number] Arrival date & time: 07/31/20  2142     History Chief Complaint  Patient presents with   Dizziness   Fall    [name] is a 87 y.o. female with a history of coronary disease status post NSTEMI, hypertension, hyperlipidemia, paroxysmal A. fib, on aspirin and amiodarone, presenting to the ED from home with a fall and near syncope.  The patient lives independently in a house.  Her next of kin is a niece who lives in town.  She reports that she was going about her day today and when she was folding laundry she turned around to begin to feel very lightheaded.  She fell to the ground.  She denies striking her head.  She was able to call an ambulance for help.  She denies any significant pain on arrival.  She denies neck or back pain.  She reports she does feel mildly lightheaded.  She denies chest pain or pressure.  She reports she been living well independently until now.  The patient was just discharged from the hospital proximally 1 month ago after being admitted for an episode of syncope.  This was felt to be likely orthostatic, although she did have a UTI at the time.  She was seen by cardiology June 6.  He noted that she had an echo done in March which showed an EF of 60 to 65%.  Cardiologist recommended continued aspirin for coronary disease, noted that the patient would not be a good catheterization candidate, and advised continuing amiodarone for parox A Flutter.  She has a hx of recurring UTI's. She reports her PCP recently started her on levaquin this week for another UTI, but she stopped after taking 2 doses due to side effects and GI upset.  HPI     Past Medical History:  Diagnosis Date   Arthritis    FINGERS    CAD (coronary artery disease) CARDIOLOGIST-- DR Angelena Form   a. s/p INF STEMI 7/12: tx with BMS to RCA;  b. cath 08/26/10: pLAD 30%, mLAD 50%, D1 40%, pCFX 95%, mRCA occluded;   c.  staged PCI of pCFX with BMS;   d. echo 7/12:   EF 60-65%, mild RAE, mild to moderate AI, mild MR, moderate TR, RVE, PASP 47   Complication of anesthesia PT STATES "MADE HER FEEL CRAZY"   Degeneration of eye    left eye cornea   Dyspnea    First degree heart block    Heart palpitations PAC'S AND SVT RUN'S  PER CARDIOLOGIST NOTE   History of ST elevation myocardial infarction (STEMI) 08-26-2010-- INFERIOR WALL   S/P PCI  BMS IN RCA AND PROX. CX   Hyperlipidemia    Hypertension    Impaired hearing BILATERAL HEARING AIDS   Osteopenia    PAF (paroxysmal atrial fibrillation) (HCC)    PONV (postoperative nausea and vomiting)    Pulmonary nodules BENIGN  PER CT  10-12-2010   Rectal Cancer 08/2010   adenocarcinoma   S/P PARTIAL PROCTECTOMY (NO CHEMO OR RADIATION)   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 01/11/2011   S/P colostomy ([place]) SECONDARY TO RECTOVAGINAL FISTULA   S/P coronary artery stent placement 08/2010   X2  BM    Patient Active Problem List   Diagnosis Date Noted   Syncope 06/29/2020   Atrial fibrillation with RVR ([place]) 05/03/2020   Other cirrhosis of liver (HCC) 05/01/2020   Paroxysmal atrial fibrillation  with rapid ventricular response (HCC) 04/30/2020   Chronic diastolic CHF (congestive heart failure) ([place]) 04/30/2020   Elevated LFTs 04/30/2020   AKI (acute kidney injury) ([place]) 04/30/2020   Atrial flutter ([place]) 04/27/2020   Secondary hypercoagulable state ([place]) 04/27/2020   Closed fracture of multiple pubic rami, left, initial encounter ([place]) 09/06/2019   Slurred speech 02/23/2017   PAF (paroxysmal atrial fibrillation) ([place]) 02/23/2017   Depression with anxiety 02/23/2017   TIA (transient ischemic attack)    Fracture, tibia and fibula 07/06/2015   Tibia/fibula fracture 07/03/2015   Lung nodule seen on imaging study 09/05/2013   Exposure to TB 09/05/2013   SOB (shortness of breath) 10/02/2011   Vascular skin changes 07/17/2011   Rectovaginal fistula  post abscess with TEM - diverted 01/11/2011   Anorexia post-op 12/08/2010   HTN (hypertension) 11/03/2010   Dizziness 09/15/2010   CAD (coronary artery disease)    Hyperlipidemia    Rectal cancer, pT2uN0(pNX) s/p TEM partial proctectomy 09/07/2010    Past Surgical History:  Procedure Laterality Date   ABDOMINAL HYSTERECTOMY  1950's   AND APPENDECTOMY   CATARACT EXTRACTION W/ INTRAOCULAR LENS  IMPLANT, BILATERAL     CORONARY ANGIOPLASTY WITH STENT PLACEMENT  08-26-2010  DR [place]   PCI, BM STENT IN RCA   CORONARY ANGIOPLASTY WITH STENT PLACEMENT  08-29-2010  DR [place]   PCI, BM STENT IN PROXIMAL CIRCUMFLEX   EXCISION BENIGN CYST RIGHT BREAST     FISTULA PLUG N/A 06/21/2012   Procedure: insertion of FISTULA PLUG;  Surgeon: Leighton Ruff, MD;  Location: [facility];  Service: General;  Laterality: N/A;   FLEXIBLE SIGMOIDOSCOPY N/A 04/02/2012   Procedure: FLEXIBLE SIGMOIDOSCOPY;  Surgeon: Leighton Ruff, MD;  Location: [facility];  Service: Endoscopy;  Laterality: N/A;   KNEE SURGERY Right 1996   LAPROSCOPY LYSIS ADHESIONS/ DRAINAGE OF PELVIC ABSCESS/ DIVERTING LOOP SIGMOID COLECTOMY  11-22-2010   POST OP RECTOVAGINAL FISTULA   PARTIAL PROCTECTOMY BY TEM  11-17-2010   RECTAL CANCER   RELEASE LEFT CARPAL TUNNEL/ OSTEOTOMY LEFT DISTAL RADIUS  11-10-2009   STAPEDECTOMY  1970'S   TONSILLECTOMY  CHILD   TOTAL HIP ARTHROPLASTY Right 1992   TRANSTHORACIC ECHOCARDIOGRAM  10-10-2011   NORMAL LV SIZE WITH MILD FOCAL BASAL SEPTAL HYPERTROPHY/ EF 55-60%/ NORMAL RV SIZE AND LVSF/ BIATRIAL ENLARGEMENT/ MILD TO MODERATE AI  &  TR   TYMPANOPLASTY Left 12-23-2009     OB History   No obstetric history on file.     Family History  Problem Relation Age of Onset   Kidney disease Mother    Cancer Brother     Social History   Tobacco Use   Smoking status: Never   Smokeless tobacco: Never  Substance Use Topics   Alcohol use: No   Drug use: No    Home Medications Prior  to Admission medications   Medication Sig Start Date End Date Taking? Authorizing Provider  acetaminophen (TYLENOL) 500 MG tablet Take 2 tablets (1,000 mg total) by mouth every 6 (six) hours as needed for mild pain. 07/06/15  Yes Rama, Venetia Maxon, MD  amiodarone (PACERONE) 200 MG tablet 1 tab (200 mg total) by mouth twice daily for 1 week followed by 1 tab (200 mg total) by mouth daily thereafter. Patient taking differently: Take 200 mg by mouth daily. 06/25/20  Yes Burnell Blanks, MD  aspirin EC 81 MG tablet Take 81 mg by mouth every morning.   Yes [provider]  beta carotene w/minerals (OCUVITE)  tablet Take 1 tablet by mouth every morning.   Yes [provider]  cephALEXin (KEFLEX) 500 MG capsule Take 1 capsule (500 mg total) by mouth 2 (two) times daily for 4 days. 08/01/20 08/05/20 Yes Sherah Lund, Carola Rhine, MD  cholecalciferol (VITAMIN D3) 25 MCG (1000 UNIT) tablet Take 1,000 Units by mouth every morning.   Yes [provider]  clonazePAM (KLONOPIN) 0.5 MG tablet Take 0.25 mg by mouth at bedtime. 05/12/20  Yes [provider]  furosemide (LASIX) 40 MG tablet Take 20 mg by mouth every morning.   Yes [provider]  levofloxacin (LEVAQUIN) 750 MG tablet Take 750 mg by mouth daily. 5 day supply 07/28/20  Yes [provider]  melatonin 5 MG TABS Take 5 mg by mouth at bedtime.   Yes [provider]  nitroGLYCERIN (NITROSTAT) 0.4 MG SL tablet Place 0.4 mg under the tongue every 5 (five) minutes as needed for chest pain.   Yes [provider]  Polyethyl Glycol-Propyl Glycol (SYSTANE) 0.4-0.3 % GEL ophthalmic gel Place 1 drop into both eyes at bedtime.    Yes [provider]  sertraline (ZOLOFT) 25 MG tablet Take 25 mg by mouth at bedtime. 10/28/19  Yes [provider]    Allergies    Effexor [venlafaxine hydrochloride], Penicillins, Sulfa antibiotics, Dicyclomine, Lactose intolerance (gi), and  Hydrocodone  Review of Systems   Review of Systems  Constitutional:  Negative for chills and fever.  Eyes:  Negative for photophobia and visual disturbance.  Respiratory:  Negative for cough and shortness of breath.   Cardiovascular:  Negative for chest pain and palpitations.  Gastrointestinal:  Negative for abdominal pain and vomiting.  Genitourinary:  Negative for dysuria and hematuria.  Musculoskeletal:  Negative for arthralgias and back pain.  Skin:  Negative for color change and rash.  Neurological:  Positive for light-headedness. Negative for syncope.  All other systems reviewed and are negative.  Physical Exam Updated Vital Signs BP (!) 151/55   Pulse 71   Temp 98.6 F (37 C) (Oral)   Resp 17   Ht 5\' 3"  (1.6 m)   Wt 42.6 kg   SpO2 95%   BMI 16.65 kg/m   Physical Exam Constitutional:      General: She is not in acute distress. HENT:     Head: Normocephalic and atraumatic.  Eyes:     Conjunctiva/sclera: Conjunctivae normal.     Pupils: Pupils are equal, round, and reactive to light.  Cardiovascular:     Rate and Rhythm: Regular rhythm. Tachycardia present.     Pulses: Normal pulses.  Pulmonary:     Effort: Pulmonary effort is normal. No respiratory distress.  Abdominal:     General: There is no distension.     Tenderness: There is no abdominal tenderness.  Musculoskeletal:        General: No swelling, tenderness or deformity.     Comments: Ecchymosis of right wrist  Skin:    General: Skin is warm and dry.  Neurological:     General: No focal deficit present.     Mental Status: She is alert and oriented to person, place, and time. Mental status is at baseline.  Psychiatric:        Mood and Affect: Mood normal.        Behavior: Behavior normal.    ED Results / Procedures / Treatments   Labs (all labs ordered are listed, but only abnormal results are displayed) Labs Reviewed  BASIC METABOLIC PANEL -  Abnormal; Notable for the following components:       Result Value   Sodium 134 (*)    Creatinine, Ser 1.13 (*)    GFR, Estimated 43 (*)    All other components within normal limits  URINALYSIS, ROUTINE W REFLEX MICROSCOPIC - Abnormal; Notable for the following components:   Leukocytes,Ua LARGE (*)    Bacteria, UA RARE (*)    All other components within normal limits  TROPONIN I (HIGH SENSITIVITY) - Abnormal; Notable for the following components:   Troponin I (High Sensitivity) 19 (*)    All other components within normal limits  TROPONIN I (HIGH SENSITIVITY) - Abnormal; Notable for the following components:   Troponin I (High Sensitivity) 26 (*)    All other components within normal limits  URINE CULTURE  CBC WITH DIFFERENTIAL/PLATELET    EKG None  Radiology DG Wrist 2 Views Right  Result Date: 07/31/2020 CLINICAL DATA:  Recent fall with wrist pain, initial encounter EXAM: RIGHT WRIST - 2 VIEW COMPARISON:  None. FINDINGS: Degenerative changes are noted in the radiocarpal joint as well as in the intercarpal joints. Mild cartilaginous calcification is seen. Degenerative changes in the visualized interphalangeal joints are seen. No acute fracture is noted. Mild soft tissue swelling is seen. IMPRESSION: Degenerative change without acute abnormality. Electronically Signed   By: Inez Catalina M.D.   On: 07/31/2020 22:52   CT Head Wo Contrast  Result Date: 07/31/2020 CLINICAL DATA:  Dizziness and fall.  Near syncopal episodes. EXAM: CT HEAD WITHOUT CONTRAST TECHNIQUE: Contiguous axial images were obtained from the base of the skull through the vertex without intravenous contrast. COMPARISON:  06/29/2020 FINDINGS: Brain: Diffuse cerebral atrophy. Ventricular dilatation consistent with central atrophy. Low-attenuation changes throughout the deep white matter consistent with small vessel ischemia. No mass-effect or midline shift. No abnormal extra-axial fluid collections. Gray-white matter junctions are distinct. Basal cisterns are not effaced. No  acute intracranial hemorrhage. Vascular: Moderate intracranial arterial vascular calcifications are present. Skull: Calvarium appears intact. Sinuses/Orbits: Paranasal sinuses and mastoid air cells are clear. Other: No significant change since previous study. IMPRESSION: No acute intracranial abnormalities. Chronic atrophy and small vessel ischemic changes. Electronically Signed   By: Lucienne Capers M.D.   On: 07/31/2020 22:40   DG Chest Portable 1 View  Result Date: 07/31/2020 CLINICAL DATA:  Recent syncopal episode with fall, initial encounter EXAM: PORTABLE CHEST 1 VIEW COMPARISON:  06/29/2020 FINDINGS: Cardiac shadow is mildly enlarged but stable. Aortic calcifications are seen. The lungs are clear bilaterally. No acute bony abnormality is seen. IMPRESSION: No acute abnormality noted. Electronically Signed   By: Inez Catalina M.D.   On: 07/31/2020 22:47    Procedures Procedures   Medications Ordered in ED Medications  sodium chloride 0.9 % bolus 500 mL (0 mLs Intravenous Stopped 07/31/20 2341)  cefTRIAXone (ROCEPHIN) 1 g in sodium chloride 0.9 % 100 mL IVPB (0 g Intravenous Stopped 08/01/20 0046)    ED Course  I have reviewed the triage vital signs and the nursing notes.  Pertinent labs & imaging results that were available during my care of the patient were reviewed by me and considered in my medical decision making (see chart for details).  This patient complains of dizziness, fatigue.  This involves an extensive number of treatment options, and is a complaint that carries with it a high risk of complications and morbidity.  The differential diagnosis includes infection vs anemia vs dehydration vs arrhythmia vs atypical ACS vs other  I ordered, reviewed,  and interpreted labs.  No life-threatening abnormalities were noted on these tests.  Delta trop low and flat I ordered medication IV fluids, IV rocephin for dehydration and UTI I ordered imaging studies which included CTH, dg right  wrist I independently visualized and interpreted imaging which showed no life-threatening abnormalities, and the monitor tracing which showed no acute traumatic injuries Additional history was obtained from patient's niece at bedside (who is her PoA) Previous records obtained and reviewed showing last urine culure    Clinical Course as of 08/01/20 0954  Sat Jul 31, 2020  2250 IMPRESSION: No acute intracranial abnormalities. Chronic atrophy and small vessel ischemic changes.  [MT]  2334 My reassessment the patient remains asymptomatic.  Her niece is now present at bedside.  Reviewed her work-up.  They show signs of a likely persistent UTI, but otherwise her electrolytes and CBC are unremarkable.  Her troponin is 19, which is low normal for her.  Her blood pressure has been stable.  She has finished her IV fluids.  I suspect her symptoms may likely be related to a UTI.  She reports her doctor recently started her on Levaquin 2 days ago but she stopped this medication due to side effects.  From reviewing her prior urine cultures, it does appear that her E. coli was resistant to fluoroquinolones last year.  Therefore we discussed given her round of IV Rocephin and sending her home on Keflex.  He has a distant allergy to penicillins but does not recall what this was.  Will need to monitor closely in the ED.  However I do think this is the best option to treat her infection.  We discussed the risks of this medication with patient and her niece we have agreed that we should attempt this here.  After completing her infusion she does wish to go home.  Her niece can take her home.  I do think she is stable to go home [MT]    Clinical Course User Index [MT] Kenyona Rena, Carola Rhine, MD    Final Clinical Impression(s) / ED Diagnoses Final diagnoses:  Trauma  Urinary tract infection without hematuria, site unspecified    Rx / DC Orders ED Discharge Orders          Ordered    cephALEXin (KEFLEX) 500 MG  capsule  2 times daily        07/31/20 2342             Wyvonnia Dusky, MD 08/01/20 [phone]

## 2020-07-31 NOTE — ED Triage Notes (Signed)
Pt arrives via [facility] from home for fall, lives alone. Pt c/o dizziness over the past few days, had a near syncopal episode on Wednesday, had family come stay with her. Today pt was standing in room and turned to quickly, got dizzy and fell. Pt hit head on floor air vent, denies head/neck pain. Has bruising to R wrist. AOx4, VSS, 20 LFA.

## 2020-07-31 NOTE — Discharge Instructions (Addendum)
Please complete the full course of antibiotics prescribed for a urine infection.  Your next dose is due tomorrow evening.

## 2020-08-01 LAB — TROPONIN I (HIGH SENSITIVITY): Troponin I (High Sensitivity): 26 ng/L — ABNORMAL HIGH (ref <18)

## 2020-08-03 LAB — URINE CULTURE: Culture: >=100000 — AB

## 2020-08-04 ENCOUNTER — Telehealth: Payer: Self-pay | Admitting: Emergency Medicine

## 2020-08-04 NOTE — Telephone Encounter (Signed)
Post ED Visit - Positive Culture Follow-up  Culture report reviewed by antimicrobial stewardship pharmacist: [facility] Team []  Elenor Quinones, Pharm.D. []  Heide Guile, Pharm.D., BCPS AQ-ID []  Parks Neptune, Pharm.D., BCPS []  Alycia Rossetti, Pharm.D., BCPS []  [place], [place].D., BCPS, AAHIVP []  Legrand Como, Pharm.D., BCPS, AAHIVP []  Salome Arnt, PharmD, BCPS []  Johnnette Gourd, PharmD, BCPS []  Hughes Better, PharmD, BCPS []  Leeroy Cha, PharmD []  Laqueta Linden, PharmD, BCPS []  Albertina Parr, PharmD  [facility] Team []  Leodis Sias, PharmD []  Lindell Spar, PharmD []  Royetta Asal, PharmD []  Graylin Shiver, Rph []  Rema Fendt) Glennon Mac, PharmD []  Arlyn Dunning, PharmD []  Netta Cedars, PharmD []  Dia Sitter, PharmD []  Leone Haven, PharmD []  Gretta Arab, PharmD []  Theodis Shove, PharmD []  Peggyann Juba, PharmD []  Reuel Boom, PharmD   Positive urine culture Treated with cephalexin, organism sensitive to the same and no further patient follow-up is required at this time.  Hazle Nordmann 08/04/2020, 1:04 PM

## 2020-08-12 ENCOUNTER — Emergency Department (HOSPITAL_COMMUNITY): Payer: Medicare Other

## 2020-08-12 ENCOUNTER — Other Ambulatory Visit: Payer: Self-pay

## 2020-08-12 ENCOUNTER — Inpatient Hospital Stay (HOSPITAL_COMMUNITY)
Admission: EM | Admit: 2020-08-12 | Discharge: 2020-08-18 | DRG: 534 | Disposition: A | Discharge status: Skilled Nursing Facility | Payer: Medicare Other | Attending: Internal Medicine | Admitting: Internal Medicine

## 2020-08-12 ENCOUNTER — Encounter (HOSPITAL_COMMUNITY): Payer: Self-pay

## 2020-08-12 DIAGNOSIS — F418 Other specified anxiety disorders: Secondary | ICD-10-CM | POA: Diagnosis present

## 2020-08-12 DIAGNOSIS — Z90710 Acquired absence of both cervix and uterus: Secondary | ICD-10-CM

## 2020-08-12 DIAGNOSIS — Z66 Do not resuscitate: Secondary | ICD-10-CM | POA: Diagnosis not present

## 2020-08-12 DIAGNOSIS — S72001A Fracture of unspecified part of neck of right femur, initial encounter for closed fracture: Secondary | ICD-10-CM

## 2020-08-12 DIAGNOSIS — D696 Thrombocytopenia, unspecified: Secondary | ICD-10-CM | POA: Diagnosis present

## 2020-08-12 DIAGNOSIS — Z955 Presence of coronary angioplasty implant and graft: Secondary | ICD-10-CM | POA: Diagnosis not present

## 2020-08-12 DIAGNOSIS — R296 Repeated falls: Secondary | ICD-10-CM | POA: Diagnosis not present

## 2020-08-12 DIAGNOSIS — S7291XA Unspecified fracture of right femur, initial encounter for closed fracture: Principal | ICD-10-CM | POA: Diagnosis present

## 2020-08-12 DIAGNOSIS — Z7982 Long term (current) use of aspirin: Secondary | ICD-10-CM | POA: Diagnosis not present

## 2020-08-12 DIAGNOSIS — I5032 Chronic diastolic (congestive) heart failure: Secondary | ICD-10-CM | POA: Diagnosis present

## 2020-08-12 DIAGNOSIS — Z96641 Presence of right artificial hip joint: Secondary | ICD-10-CM | POA: Diagnosis not present

## 2020-08-12 DIAGNOSIS — M9701XA Periprosthetic fracture around internal prosthetic right hip joint, initial encounter: Secondary | ICD-10-CM | POA: Diagnosis not present

## 2020-08-12 DIAGNOSIS — E871 Hypo-osmolality and hyponatremia: Secondary | ICD-10-CM | POA: Diagnosis present

## 2020-08-12 DIAGNOSIS — I110 Hypertensive heart disease with heart failure: Secondary | ICD-10-CM | POA: Diagnosis not present

## 2020-08-12 DIAGNOSIS — I252 Old myocardial infarction: Secondary | ICD-10-CM | POA: Diagnosis not present

## 2020-08-12 DIAGNOSIS — D649 Anemia, unspecified: Secondary | ICD-10-CM | POA: Diagnosis present

## 2020-08-12 DIAGNOSIS — I480 Paroxysmal atrial fibrillation: Secondary | ICD-10-CM | POA: Diagnosis not present

## 2020-08-12 DIAGNOSIS — E440 Moderate protein-calorie malnutrition: Secondary | ICD-10-CM | POA: Diagnosis not present

## 2020-08-12 DIAGNOSIS — W010XXA Fall on same level from slipping, tripping and stumbling without subsequent striking against object, initial encounter: Secondary | ICD-10-CM | POA: Diagnosis not present

## 2020-08-12 DIAGNOSIS — M25551 Pain in right hip: Secondary | ICD-10-CM | POA: Diagnosis present

## 2020-08-12 DIAGNOSIS — Z8673 Personal history of transient ischemic attack (TIA), and cerebral infarction without residual deficits: Secondary | ICD-10-CM | POA: Diagnosis not present

## 2020-08-12 DIAGNOSIS — Z91011 Allergy to milk products: Secondary | ICD-10-CM

## 2020-08-12 DIAGNOSIS — Z885 Allergy status to narcotic agent status: Secondary | ICD-10-CM

## 2020-08-12 DIAGNOSIS — Z79899 Other long term (current) drug therapy: Secondary | ICD-10-CM

## 2020-08-12 DIAGNOSIS — I2510 Atherosclerotic heart disease of native coronary artery without angina pectoris: Secondary | ICD-10-CM | POA: Diagnosis present

## 2020-08-12 DIAGNOSIS — I4892 Unspecified atrial flutter: Secondary | ICD-10-CM | POA: Diagnosis present

## 2020-08-12 DIAGNOSIS — Z85048 Personal history of other malignant neoplasm of rectum, rectosigmoid junction, and anus: Secondary | ICD-10-CM

## 2020-08-12 DIAGNOSIS — Z888 Allergy status to other drugs, medicaments and biological substances status: Secondary | ICD-10-CM

## 2020-08-12 DIAGNOSIS — Z681 Body mass index (BMI) 19 or less, adult: Secondary | ICD-10-CM | POA: Diagnosis not present

## 2020-08-12 DIAGNOSIS — R739 Hyperglycemia, unspecified: Secondary | ICD-10-CM | POA: Diagnosis not present

## 2020-08-12 DIAGNOSIS — Z882 Allergy status to sulfonamides status: Secondary | ICD-10-CM

## 2020-08-12 DIAGNOSIS — C20 Malignant neoplasm of rectum: Secondary | ICD-10-CM | POA: Diagnosis present

## 2020-08-12 DIAGNOSIS — I10 Essential (primary) hypertension: Secondary | ICD-10-CM | POA: Diagnosis present

## 2020-08-12 DIAGNOSIS — Z933 Colostomy status: Secondary | ICD-10-CM

## 2020-08-12 DIAGNOSIS — Z9049 Acquired absence of other specified parts of digestive tract: Secondary | ICD-10-CM

## 2020-08-12 DIAGNOSIS — W19XXXA Unspecified fall, initial encounter: Secondary | ICD-10-CM

## 2020-08-12 DIAGNOSIS — Z20822 Contact with and (suspected) exposure to covid-19: Secondary | ICD-10-CM | POA: Diagnosis present

## 2020-08-12 DIAGNOSIS — Z880 Allergy status to penicillin: Secondary | ICD-10-CM

## 2020-08-12 DIAGNOSIS — K7469 Other cirrhosis of liver: Secondary | ICD-10-CM | POA: Diagnosis present

## 2020-08-12 DIAGNOSIS — S72091A Other fracture of head and neck of right femur, initial encounter for closed fracture: Secondary | ICD-10-CM | POA: Diagnosis not present

## 2020-08-12 DIAGNOSIS — M25559 Pain in unspecified hip: Secondary | ICD-10-CM | POA: Diagnosis present

## 2020-08-12 LAB — CBC WITH DIFFERENTIAL/PLATELET
Abs Immature Granulocytes: 0.02 10*3/uL (ref 0.00–0.07)
Basophils Absolute: 0 10*3/uL (ref 0.0–0.1)
Basophils Relative: 0 %
Eosinophils Absolute: 0.1 10*3/uL (ref 0.0–0.5)
Eosinophils Relative: 1 %
HCT: 38.9 % (ref 36.0–46.0)
Hemoglobin: 12.7 g/dL (ref 12.0–15.0)
Immature Granulocytes: 0 %
Lymphocytes Relative: 26 %
Lymphs Abs: 2.2 10*3/uL (ref 0.7–4.0)
MCH: 31.1 pg (ref 26.0–34.0)
MCHC: 32.6 g/dL (ref 30.0–36.0)
MCV: 95.1 fL (ref 80.0–100.0)
Monocytes Absolute: 0.6 10*3/uL (ref 0.1–1.0)
Monocytes Relative: 7 %
Neutro Abs: 5.5 10*3/uL (ref 1.7–7.7)
Neutrophils Relative %: 66 %
Platelets: 176 10*3/uL (ref 150–400)
RBC: 4.09 MIL/uL (ref 3.87–5.11)
RDW: 15.5 % (ref 11.5–15.5)
WBC: 8.3 10*3/uL (ref 4.0–10.5)
nRBC: 0 % (ref 0.0–0.2)

## 2020-08-12 LAB — BASIC METABOLIC PANEL
Anion gap: 11 (ref 5–15)
BUN: 16 mg/dL (ref 8–23)
CO2: 25 mmol/L (ref 22–32)
Calcium: 9.2 mg/dL (ref 8.9–10.3)
Chloride: 95 mmol/L — ABNORMAL LOW (ref 98–111)
Creatinine, Ser: 0.84 mg/dL (ref 0.44–1.00)
GFR, Estimated: >60 mL/min (ref >60)
Glucose, Bld: 95 mg/dL (ref 70–99)
Potassium: 4.1 mmol/L (ref 3.5–5.1)
Sodium: 131 mmol/L — ABNORMAL LOW (ref 135–145)

## 2020-08-12 LAB — TYPE AND SCREEN
ABO/RH(D): O POS
Antibody Screen: NEGATIVE

## 2020-08-12 LAB — PROTIME-INR
INR: 1 (ref 0.8–1.2)
Prothrombin Time: 13.5 seconds (ref 11.4–15.2)

## 2020-08-12 MED ORDER — AMIODARONE HCL 200 MG PO TABS
200.0000 mg | ORAL_TABLET | Freq: Every day | ORAL | Status: DC
  Administered 2020-08-13 – 2020-08-18 (×6): 200 mg via ORAL
  Filled 2020-08-12 (×6): qty 1

## 2020-08-12 MED ORDER — FENTANYL CITRATE (PF) 100 MCG/2ML IJ SOLN
50.0000 ug | INTRAMUSCULAR | Status: DC | PRN
Start: 2020-08-12 — End: 2020-08-12
  Administered 2020-08-12: 50 ug via INTRAVENOUS
  Filled 2020-08-12: qty 2

## 2020-08-12 MED ORDER — ONDANSETRON HCL 4 MG/2ML IJ SOLN
4.0000 mg | Freq: Once | INTRAMUSCULAR | Status: AC
  Administered 2020-08-12: 4 mg via INTRAVENOUS
  Filled 2020-08-12: qty 2

## 2020-08-12 MED ORDER — POLYVINYL ALCOHOL 1.4 % OP SOLN
1.0000 [drp] | Freq: Every day | OPHTHALMIC | Status: DC
  Administered 2020-08-12 – 2020-08-17 (×6): 1 [drp] via OPHTHALMIC
  Filled 2020-08-12: qty 15

## 2020-08-12 MED ORDER — HYDROMORPHONE HCL 1 MG/ML IJ SOLN
0.5000 mg | INTRAMUSCULAR | Status: DC | PRN
Start: 2020-08-12 — End: 2020-08-19

## 2020-08-12 MED ORDER — ASPIRIN EC 81 MG PO TBEC
81.0000 mg | DELAYED_RELEASE_TABLET | Freq: Every morning | ORAL | Status: DC
  Administered 2020-08-13 – 2020-08-18 (×6): 81 mg via ORAL
  Filled 2020-08-12 (×6): qty 1

## 2020-08-12 MED ORDER — HYDROCODONE-ACETAMINOPHEN 5-325 MG PO TABS
1.0000 | ORAL_TABLET | Freq: Four times a day (QID) | ORAL | Status: DC | PRN
  Administered 2020-08-13 – 2020-08-15 (×5): 1 via ORAL
  Filled 2020-08-12 (×6): qty 1

## 2020-08-12 MED ORDER — POLYETHYLENE GLYCOL 3350 17 G PO PACK
17.0000 g | PACK | Freq: Every day | ORAL | Status: DC | PRN
  Administered 2020-08-14 – 2020-08-15 (×2): 17 g via ORAL
  Filled 2020-08-12 (×3): qty 1

## 2020-08-12 MED ORDER — POLYETHYL GLYCOL-PROPYL GLYCOL 0.4-0.3 % OP GEL: Freq: Every day | OPHTHALMIC | Status: DC

## 2020-08-12 MED ORDER — NITROGLYCERIN 0.4 MG SL SUBL: 0.4000 mg | SUBLINGUAL_TABLET | SUBLINGUAL | Status: DC | PRN

## 2020-08-12 MED ORDER — SERTRALINE HCL 25 MG PO TABS
25.0000 mg | ORAL_TABLET | Freq: Every day | ORAL | Status: DC
  Administered 2020-08-12 – 2020-08-17 (×6): 25 mg via ORAL
  Filled 2020-08-12 (×6): qty 1

## 2020-08-12 MED ORDER — FUROSEMIDE 20 MG PO TABS
20.0000 mg | ORAL_TABLET | Freq: Every morning | ORAL | Status: DC
  Administered 2020-08-13 – 2020-08-18 (×6): 20 mg via ORAL
  Filled 2020-08-12 (×6): qty 1

## 2020-08-12 MED ORDER — MELATONIN 5 MG PO TABS
5.0000 mg | ORAL_TABLET | Freq: Every day | ORAL | Status: DC
  Administered 2020-08-12 – 2020-08-17 (×6): 5 mg via ORAL
  Filled 2020-08-12 (×6): qty 1

## 2020-08-12 NOTE — Consult Note (Signed)
Reason for Consult:Right hip fx Referring Physician: Dorie Rank Time called: [id number] Time at bedside: Wood[place] is an 87 y.o. female.  HPI: Vianny fell at home when she tripped over the comforter she was taking off the bed. She had immediate right hip pain and could not get up. She was brought to the ED where x-rays showed a periprosthetic right hip fx and CT was recommended. Hip was replaced in 1992, surgeon unknown. She lives at home alone and ambulates with a RW. She c/o localized pain to the hip. She has fallen 5 times in the last several weeks.  Past Medical History:  Diagnosis Date   Arthritis    FINGERS    CAD (coronary artery disease) CARDIOLOGIST-- DR Angelena Form   a. s/p INF STEMI 7/12: tx with BMS to RCA;  b. cath 08/26/10: pLAD 30%, mLAD 50%, D1 40%, pCFX 95%, mRCA occluded;   c. staged PCI of pCFX with BMS;   d. echo 7/12:   EF 60-65%, mild RAE, mild to moderate AI, mild MR, moderate TR, RVE, PASP 47   Complication of anesthesia PT STATES "MADE HER FEEL CRAZY"   Degeneration of eye    left eye cornea   Dyspnea    First degree heart block    Heart palpitations PAC'S AND SVT RUN'S  PER CARDIOLOGIST NOTE   History of ST elevation myocardial infarction (STEMI) 08-26-2010-- INFERIOR WALL   S/P PCI  BMS IN RCA AND PROX. CX   Hyperlipidemia    Hypertension    Impaired hearing BILATERAL HEARING AIDS   Osteopenia    PAF (paroxysmal atrial fibrillation) (HCC)    PONV (postoperative nausea and vomiting)    Pulmonary nodules BENIGN  PER CT  10-12-2010   Rectal Cancer 08/2010   adenocarcinoma   S/P PARTIAL PROCTECTOMY (NO CHEMO OR RADIATION)   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 01/11/2011   S/P colostomy ([place]) SECONDARY TO RECTOVAGINAL FISTULA   S/P coronary artery stent placement 08/2010   X2  BM    Past Surgical History:  Procedure Laterality Date   ABDOMINAL HYSTERECTOMY  1950's   AND APPENDECTOMY   CATARACT EXTRACTION W/ INTRAOCULAR LENS  IMPLANT,  BILATERAL     CORONARY ANGIOPLASTY WITH STENT PLACEMENT  08-26-2010  DR White   PCI, BM STENT IN RCA   CORONARY ANGIOPLASTY WITH STENT PLACEMENT  08-29-2010  DR Merrifield   PCI, BM STENT IN PROXIMAL CIRCUMFLEX   EXCISION BENIGN CYST RIGHT BREAST     FISTULA PLUG N/A 06/21/2012   Procedure: insertion of FISTULA PLUG;  Surgeon: Leighton Ruff, MD;  Location: [facility];  Service: General;  Laterality: N/A;   [place] N/A 04/02/2012   Procedure: FLEXIBLE SIGMOIDOSCOPY;  Surgeon: Leighton Ruff, MD;  Location: [facility];  Service: Endoscopy;  Laterality: N/A;   KNEE SURGERY Right 1996   LAPROSCOPY LYSIS ADHESIONS/ DRAINAGE OF PELVIC ABSCESS/ DIVERTING LOOP SIGMOID COLECTOMY  11-22-2010   POST OP RECTOVAGINAL FISTULA   PARTIAL PROCTECTOMY BY TEM  11-17-2010   RECTAL CANCER   RELEASE LEFT CARPAL TUNNEL/ OSTEOTOMY LEFT DISTAL RADIUS  11-10-2009   STAPEDECTOMY  1970'S   TONSILLECTOMY  CHILD   TOTAL HIP ARTHROPLASTY Right 1992   TRANSTHORACIC ECHOCARDIOGRAM  10-10-2011   NORMAL LV SIZE WITH MILD FOCAL BASAL SEPTAL HYPERTROPHY/ EF 55-60%/ NORMAL RV SIZE AND LVSF/ BIATRIAL ENLARGEMENT/ MILD TO MODERATE AI  &  TR   TYMPANOPLASTY Left 12-23-2009    Family History  Problem Relation Age of Onset   Kidney disease Mother    Cancer Brother     Social History:  reports that she has never smoked. She has never used smokeless tobacco. She reports that she does not drink alcohol and does not use drugs.  Allergies:  Allergies  Allergen Reactions   Effexor [Venlafaxine Hydrochloride] Nausea And Vomiting   Penicillins Shortness Of Breath    Has patient had a PCN reaction causing immediate rash, facial/tongue/throat swelling, SOB or lightheadedness with hypotension: Yes Has patient had a PCN reaction causing severe rash involving mucus membranes or skin necrosis: No Has patient had a PCN reaction that required hospitalization: No Has patient had a PCN reaction occurring  within the last 10 years: No If all of the above answers are "NO", then may proceed with Cephalosporin use.    Sulfa Antibiotics Nausea Only   Dicyclomine Other (See Comments)    Caused confusion   Lactose Intolerance (Gi) Diarrhea   Hydrocodone Nausea Only    Medications: I have reviewed the patient's current medications.  No results found for this or any previous visit (from the past 48 hour(s)).  DG Chest 1 View  Result Date: 08/12/2020 CLINICAL DATA:  Fall. EXAM: CHEST  1 VIEW COMPARISON:  07/31/2020.  06/29/2020. FINDINGS: Cardiomegaly. No pulmonary venous congestion. New mild atelectatic changes right mid lung. Follow-up to demonstrate clearing suggested. No pleural effusion or pneumothorax. Cardiomegaly. No pulmonary venous congestion. Stable deformity left humerus. No acute bony abnormality identified. IMPRESSION: 1. New mild atelectatic changes noted the right lung. Follow-up exam suggested demonstrate resolution. 2.  Stable cardiomegaly. 3. Stable deformity left humerus. No acute bony abnormality identified. No pneumothorax. Electronically Signed   By: Marcello Moores  Register   On: 08/12/2020 12:25   CT HEAD WO CONTRAST  Result Date: 08/12/2020 CLINICAL DATA:  [place] yesterday. EXAM: CT HEAD WITHOUT CONTRAST CT CERVICAL SPINE WITHOUT CONTRAST TECHNIQUE: Multidetector CT imaging of the head and cervical spine was performed following the standard protocol without intravenous contrast. Multiplanar CT image reconstructions of the cervical spine were also generated. COMPARISON:  CT head dated July 31, 2020. CT cervical spine dated Jun 29, 2020. FINDINGS: CT HEAD FINDINGS Brain: No evidence of acute infarction, hemorrhage, hydrocephalus, extra-axial collection or mass lesion/mass effect. Stable atrophy and chronic microvascular ischemic changes. Vascular: Calcified atherosclerosis at the skullbase. No hyperdense vessel. Skull: Normal. Negative for fracture or focal lesion. Sinuses/Orbits: No acute  finding. Chronic small amount of fluid in the right sphenoid sinus. Other: None. CT CERVICAL SPINE FINDINGS Alignment: No traumatic malalignment. Unchanged mild anterolisthesis at C7-T1. Skull base and vertebrae: No acute fracture. No primary bone lesion or focal pathologic process. Soft tissues and spinal canal: No prevertebral fluid or swelling. No visible canal hematoma. Disc levels: Fused C3-C4 and C4-C5. Unchanged severe C5-C6 and C6-C7 disc height loss. Upper chest: Unchanged biapical pleuroparenchymal scarring. Other: None. IMPRESSION: 1. No acute intracranial abnormality. 2. No acute cervical spine fracture or traumatic malalignment. Electronically Signed   By: Titus Dubin M.D.   On: 08/12/2020 13:02   CT CERVICAL SPINE WO CONTRAST  Result Date: 08/12/2020 CLINICAL DATA:  [place] yesterday. EXAM: CT HEAD WITHOUT CONTRAST CT CERVICAL SPINE WITHOUT CONTRAST TECHNIQUE: Multidetector CT imaging of the head and cervical spine was performed following the standard protocol without intravenous contrast. Multiplanar CT image reconstructions of the cervical spine were also generated. COMPARISON:  CT head dated July 31, 2020. CT cervical spine dated Jun 29, 2020. FINDINGS: CT HEAD FINDINGS Brain: No  evidence of acute infarction, hemorrhage, hydrocephalus, extra-axial collection or mass lesion/mass effect. Stable atrophy and chronic microvascular ischemic changes. Vascular: Calcified atherosclerosis at the skullbase. No hyperdense vessel. Skull: Normal. Negative for fracture or focal lesion. Sinuses/Orbits: No acute finding. Chronic small amount of fluid in the right sphenoid sinus. Other: None. CT CERVICAL SPINE FINDINGS Alignment: No traumatic malalignment. Unchanged mild anterolisthesis at C7-T1. Skull base and vertebrae: No acute fracture. No primary bone lesion or focal pathologic process. Soft tissues and spinal canal: No prevertebral fluid or swelling. No visible canal hematoma. Disc levels: Fused C3-C4 and  C4-C5. Unchanged severe C5-C6 and C6-C7 disc height loss. Upper chest: Unchanged biapical pleuroparenchymal scarring. Other: None. IMPRESSION: 1. No acute intracranial abnormality. 2. No acute cervical spine fracture or traumatic malalignment. Electronically Signed   By: Titus Dubin M.D.   On: 08/12/2020 13:02   DG Hip Unilat With Pelvis 2-3 Views Right  Result Date: 08/12/2020 CLINICAL DATA:  Trip and fall with right hip pain, initial encounter EXAM: DG HIP (WITH OR WITHOUT PELVIS) 2-3V RIGHT COMPARISON:  05/24/2020 FINDINGS: Right hip replacement is noted. The prosthesis appears well seated. Prior left pubic rami fractures are again noted. Subtle cortical irregularity is noted along the medial shaft of the femur adjacent to the prosthesis. Undisplaced fracture could not be totally excluded. IMPRESSION: Mild cortical irregularity along the medial aspect of the proximal femur adjacent to the prosthesis. This may represent an undisplaced fracture. CT may be helpful as clinically indicated. Healing left pubic rami fractures. Electronically Signed   By: Inez Catalina M.D.   On: 08/12/2020 12:25    Review of Systems  HENT:  Negative for ear discharge, ear pain, hearing loss and tinnitus.   Eyes:  Negative for photophobia and pain.  Respiratory:  Negative for cough and shortness of breath.   Cardiovascular:  Negative for chest pain.  Gastrointestinal:  Negative for abdominal pain, nausea and vomiting.  Genitourinary:  Negative for dysuria, flank pain, frequency and urgency.  Musculoskeletal:  Positive for arthralgias (Right hip). Negative for back pain, myalgias and neck pain.  Neurological:  Negative for dizziness and headaches.  Hematological:  Does not bruise/bleed easily.  Psychiatric/Behavioral:  The patient is not nervous/anxious.   Blood pressure (!) 144/51, pulse 60, temperature 98.4 F (36.9 C), temperature source Oral, resp. rate 16, height 5\' 3"  (1.6 m), weight 42.6 kg, SpO2 100  %. Physical Exam Constitutional:      General: She is not in acute distress.    Appearance: She is well-developed. She is not diaphoretic.  HENT:     Head: Normocephalic and atraumatic.  Eyes:     General: No scleral icterus.       Right eye: No discharge.        Left eye: No discharge.     Conjunctiva/sclera: Conjunctivae normal.  Cardiovascular:     Rate and Rhythm: Normal rate and regular rhythm.  Pulmonary:     Effort: Pulmonary effort is normal. No respiratory distress.  Musculoskeletal:     Cervical back: Normal range of motion.     Comments: RLE No traumatic wounds, ecchymosis, or rash  Severe TTP hip  No knee or ankle effusion  Knee stable to varus/ valgus and anterior/posterior stress  Sens DPN, SPN, TN intact  Motor EHL, ext, flex, evers 5/5  DP 0, PT 0, No significant edema  Skin:    General: Skin is warm and dry.  Neurological:     Mental Status: She is alert.  Psychiatric:        Mood and Affect: Mood normal.        Behavior: Behavior normal.    Assessment/Plan: Right hip fx -- Based on x-rays appears non-operative and can probably WBAT. Will look at CT when available. Likely needs admission and placement.    Lisette Abu, PA-C Orthopedic Surgery [phone] 08/12/2020, 3:08 PM

## 2020-08-12 NOTE — ED Notes (Signed)
Patient continues to decline pain medication offer. Says she is sleepy. Daughter remains at bedside. Awaiting disposition and admission consultation.

## 2020-08-12 NOTE — ED Provider Notes (Signed)
[facility] EMERGENCY DEPARTMENT Provider Note   CSN: [id number] Arrival date & time: 08/12/20  1108     History Chief Complaint  Patient presents with   Leg Injury    Right hip pain , fall at home     [name] is a 87 y.o. female.  HPI  Patient presented to the ED for evaluation of hip pain after a fall.  Patient states she tripped over her comforter when she was taking it off the bed.  Patient ended up falling onto her right side.  She was not able to stand up but eventually was able to call for some help.  EMS arrived and brought her to the ED.  Patient states she is having pain in her right hip and is not able to stand.  Hurts to move her leg.  She does have some pain in the back of her head also but does not think she hit her head.  She denies any loss of consciousness.  No chest pain or shortness of breath.  No fevers or chills.  Patient normally lives at home and is able to walk.  Past Medical History:  Diagnosis Date   Arthritis    FINGERS    CAD (coronary artery disease) CARDIOLOGIST-- DR Angelena Form   a. s/p INF STEMI 7/12: tx with BMS to RCA;  b. cath 08/26/10: pLAD 30%, mLAD 50%, D1 40%, pCFX 95%, mRCA occluded;   c. staged PCI of pCFX with BMS;   d. echo 7/12:   EF 60-65%, mild RAE, mild to moderate AI, mild MR, moderate TR, RVE, PASP 47   Complication of anesthesia PT STATES "MADE HER FEEL CRAZY"   Degeneration of eye    left eye cornea   Dyspnea    First degree heart block    Heart palpitations PAC'S AND SVT RUN'S  PER CARDIOLOGIST NOTE   History of ST elevation myocardial infarction (STEMI) 08-26-2010-- INFERIOR WALL   S/P PCI  BMS IN RCA AND PROX. CX   Hyperlipidemia    Hypertension    Impaired hearing BILATERAL HEARING AIDS   Osteopenia    PAF (paroxysmal atrial fibrillation) (HCC)    PONV (postoperative nausea and vomiting)    Pulmonary nodules BENIGN  PER CT  10-12-2010   Rectal Cancer 08/2010   adenocarcinoma   S/P PARTIAL  PROCTECTOMY (NO CHEMO OR RADIATION)   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 01/11/2011   S/P colostomy ([place]) SECONDARY TO RECTOVAGINAL FISTULA   S/P coronary artery stent placement 08/2010   X2  BM    Patient Active Problem List   Diagnosis Date Noted   Syncope 06/29/2020   Atrial fibrillation with RVR ([place]) 05/03/2020   Other cirrhosis of liver ([place]) 05/01/2020   Paroxysmal atrial fibrillation with rapid ventricular response (HCC) 04/30/2020   Chronic diastolic CHF (congestive heart failure) ([place]) 04/30/2020   Elevated LFTs 04/30/2020   AKI (acute kidney injury) ([place]) 04/30/2020   Atrial flutter ([place]) 04/27/2020   Secondary hypercoagulable state ([place]) 04/27/2020   Closed fracture of multiple pubic rami, left, initial encounter ([place]) 09/06/2019   Slurred speech 02/23/2017   PAF (paroxysmal atrial fibrillation) ([place]) 02/23/2017   Depression with anxiety 02/23/2017   TIA (transient ischemic attack)    Fracture, tibia and fibula 07/06/2015   Tibia/fibula fracture 07/03/2015   Lung nodule seen on imaging study 09/05/2013   Exposure to TB 09/05/2013   SOB (shortness of breath) 10/02/2011   Vascular skin changes  07/17/2011   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 01/11/2011   Anorexia post-op 12/08/2010   HTN (hypertension) 11/03/2010   Dizziness 09/15/2010   CAD (coronary artery disease)    Hyperlipidemia    Rectal cancer, pT2uN0(pNX) s/p TEM partial proctectomy 09/07/2010    Past Surgical History:  Procedure Laterality Date   ABDOMINAL HYSTERECTOMY  1950's   AND APPENDECTOMY   CATARACT EXTRACTION W/ INTRAOCULAR LENS  IMPLANT, BILATERAL     CORONARY ANGIOPLASTY WITH STENT PLACEMENT  08-26-2010  DR Falconaire   PCI, BM STENT IN RCA   CORONARY ANGIOPLASTY WITH STENT PLACEMENT  08-29-2010  DR Appleton   PCI, BM STENT IN PROXIMAL CIRCUMFLEX   EXCISION BENIGN CYST RIGHT BREAST     FISTULA PLUG N/A 06/21/2012   Procedure: insertion of FISTULA PLUG;   Surgeon: Leighton Ruff, MD;  Location: [facility];  Service: General;  Laterality: N/A;   FLEXIBLE SIGMOIDOSCOPY N/A 04/02/2012   Procedure: FLEXIBLE SIGMOIDOSCOPY;  Surgeon: Leighton Ruff, MD;  Location: [facility];  Service: Endoscopy;  Laterality: N/A;   KNEE SURGERY Right 1996   LAPROSCOPY LYSIS ADHESIONS/ DRAINAGE OF PELVIC ABSCESS/ DIVERTING LOOP SIGMOID COLECTOMY  11-22-2010   POST OP RECTOVAGINAL FISTULA   PARTIAL PROCTECTOMY BY TEM  11-17-2010   RECTAL CANCER   RELEASE LEFT CARPAL TUNNEL/ OSTEOTOMY LEFT DISTAL RADIUS  11-10-2009   STAPEDECTOMY  1970'S   TONSILLECTOMY  CHILD   TOTAL HIP ARTHROPLASTY Right 1992   TRANSTHORACIC ECHOCARDIOGRAM  10-10-2011   NORMAL LV SIZE WITH MILD FOCAL BASAL SEPTAL HYPERTROPHY/ EF 55-60%/ NORMAL RV SIZE AND LVSF/ BIATRIAL ENLARGEMENT/ MILD TO MODERATE AI  &  TR   TYMPANOPLASTY Left 12-23-2009     OB History   No obstetric history on file.     Family History  Problem Relation Age of Onset   Kidney disease Mother    Cancer Brother     Social History   Tobacco Use   Smoking status: Never   Smokeless tobacco: Never  Substance Use Topics   Alcohol use: No   Drug use: No    Home Medications Prior to Admission medications   Medication Sig Start Date End Date Taking? Authorizing Provider  acetaminophen (TYLENOL) 500 MG tablet Take 2 tablets (1,000 mg total) by mouth every 6 (six) hours as needed for mild pain. 07/06/15   Rama, Venetia Maxon, MD  amiodarone (PACERONE) 200 MG tablet 1 tab (200 mg total) by mouth twice daily for 1 week followed by 1 tab (200 mg total) by mouth daily thereafter. Patient taking differently: Take 200 mg by mouth daily. 06/25/20   Burnell Blanks, MD  aspirin EC 81 MG tablet Take 81 mg by mouth every morning.    [provider]  beta carotene w/minerals (OCUVITE) tablet Take 1 tablet by mouth every morning.    [provider]  cholecalciferol (VITAMIN D3) 25 MCG (1000 UNIT)  tablet Take 1,000 Units by mouth every morning.    [provider]  clonazePAM (KLONOPIN) 0.5 MG tablet Take 0.25 mg by mouth at bedtime. 05/12/20   [provider]  furosemide (LASIX) 40 MG tablet Take 20 mg by mouth every morning.    [provider]  levofloxacin (LEVAQUIN) 750 MG tablet Take 750 mg by mouth daily. 5 day supply 07/28/20   [provider]  melatonin 5 MG TABS Take 5 mg by mouth at bedtime.    [provider]  nitroGLYCERIN (NITROSTAT) 0.4 MG SL tablet  Place 0.4 mg under the tongue every 5 (five) minutes as needed for chest pain.    [provider]  Polyethyl Glycol-Propyl Glycol (SYSTANE) 0.4-0.3 % GEL ophthalmic gel Place 1 drop into both eyes at bedtime.     [provider]  sertraline (ZOLOFT) 25 MG tablet Take 25 mg by mouth at bedtime. 10/28/19   [provider]    Allergies    Effexor [venlafaxine hydrochloride], Penicillins, Sulfa antibiotics, Dicyclomine, Lactose intolerance (gi), and Hydrocodone  Review of Systems   Review of Systems  All other systems reviewed and are negative.  Physical Exam Updated Vital Signs BP (!) 144/51   Pulse 60   Temp 98.4 F (36.9 C) (Oral)   Resp 13   Ht 1.6 m (5\' 3" )   Wt 42.6 kg   SpO2 98%   BMI 16.65 kg/m   Physical Exam Vitals and nursing note reviewed.  Constitutional:      Appearance: She is well-developed.     Comments: Elderly, frail  HENT:     Head: Normocephalic and atraumatic.     Right Ear: External ear normal.     Left Ear: External ear normal.  Eyes:     General: No scleral icterus.       Right eye: No discharge.        Left eye: No discharge.     Conjunctiva/sclera: Conjunctivae normal.  Neck:     Trachea: No tracheal deviation.  Cardiovascular:     Rate and Rhythm: Normal rate and regular rhythm.  Pulmonary:     Effort: Pulmonary effort is normal. No respiratory distress.     Breath sounds: Normal breath sounds. No stridor. No  wheezing or rales.  Abdominal:     General: Bowel sounds are normal. There is no distension.     Palpations: Abdomen is soft.     Tenderness: There is no abdominal tenderness. There is no guarding or rebound.  Musculoskeletal:        General: Tenderness present. No deformity.     Cervical back: Neck supple.     Right hip: Tenderness and bony tenderness present.     Comments: Decreased range of motion right lower extremity, pain with trying to lift hip  Skin:    General: Skin is warm and dry.     Findings: No rash.  Neurological:     General: No focal deficit present.     Mental Status: She is alert.     Cranial Nerves: No cranial nerve deficit (no facial droop, extraocular movements intact, no slurred speech).     Sensory: No sensory deficit.     Motor: No abnormal muscle tone or seizure activity.     Coordination: Coordination normal.  Psychiatric:        Mood and Affect: Mood normal.    ED Results / Procedures / Treatments   Labs (all labs ordered are listed, but only abnormal results are displayed) Labs Reviewed  SARS CORONAVIRUS 2 (TAT 6-24 HRS)  BASIC METABOLIC PANEL  CBC WITH DIFFERENTIAL/PLATELET  PROTIME-INR  TYPE AND SCREEN    EKG EKG Interpretation  Date/Time:  Thursday August 12 2020 11:20:58 EDT Ventricular Rate:  67 PR Interval:  232 QRS Duration: 152 QT Interval:  479 QTC Calculation: 506 R Axis:   -47 Text Interpretation: Sinus or ectopic atrial rhythm Prolonged PR interval Left bundle branch block No significant change since last tracing Confirmed by Dorie Rank [phone]) on 08/12/2020 12:22:55 PM  Radiology DG Chest 1  View  Result Date: 08/12/2020 CLINICAL DATA:  Fall. EXAM: CHEST  1 VIEW COMPARISON:  07/31/2020.  06/29/2020. FINDINGS: Cardiomegaly. No pulmonary venous congestion. New mild atelectatic changes right mid lung. Follow-up to demonstrate clearing suggested. No pleural effusion or pneumothorax. Cardiomegaly. No pulmonary venous congestion.  Stable deformity left humerus. No acute bony abnormality identified. IMPRESSION: 1. New mild atelectatic changes noted the right lung. Follow-up exam suggested demonstrate resolution. 2.  Stable cardiomegaly. 3. Stable deformity left humerus. No acute bony abnormality identified. No pneumothorax. Electronically Signed   By: Marcello Moores  Register   On: 08/12/2020 12:25   CT HEAD WO CONTRAST  Result Date: 08/12/2020 CLINICAL DATA:  [place] yesterday. EXAM: CT HEAD WITHOUT CONTRAST CT CERVICAL SPINE WITHOUT CONTRAST TECHNIQUE: Multidetector CT imaging of the head and cervical spine was performed following the standard protocol without intravenous contrast. Multiplanar CT image reconstructions of the cervical spine were also generated. COMPARISON:  CT head dated July 31, 2020. CT cervical spine dated Jun 29, 2020. FINDINGS: CT HEAD FINDINGS Brain: No evidence of acute infarction, hemorrhage, hydrocephalus, extra-axial collection or mass lesion/mass effect. Stable atrophy and chronic microvascular ischemic changes. Vascular: Calcified atherosclerosis at the skullbase. No hyperdense vessel. Skull: Normal. Negative for fracture or focal lesion. Sinuses/Orbits: No acute finding. Chronic small amount of fluid in the right sphenoid sinus. Other: None. CT CERVICAL SPINE FINDINGS Alignment: No traumatic malalignment. Unchanged mild anterolisthesis at C7-T1. Skull base and vertebrae: No acute fracture. No primary bone lesion or focal pathologic process. Soft tissues and spinal canal: No prevertebral fluid or swelling. No visible canal hematoma. Disc levels: Fused C3-C4 and C4-C5. Unchanged severe C5-C6 and C6-C7 disc height loss. Upper chest: Unchanged biapical pleuroparenchymal scarring. Other: None. IMPRESSION: 1. No acute intracranial abnormality. 2. No acute cervical spine fracture or traumatic malalignment. Electronically Signed   By: Titus Dubin M.D.   On: 08/12/2020 13:02   CT CERVICAL SPINE WO CONTRAST  Result Date:  08/12/2020 CLINICAL DATA:  [place] yesterday. EXAM: CT HEAD WITHOUT CONTRAST CT CERVICAL SPINE WITHOUT CONTRAST TECHNIQUE: Multidetector CT imaging of the head and cervical spine was performed following the standard protocol without intravenous contrast. Multiplanar CT image reconstructions of the cervical spine were also generated. COMPARISON:  CT head dated July 31, 2020. CT cervical spine dated Jun 29, 2020. FINDINGS: CT HEAD FINDINGS Brain: No evidence of acute infarction, hemorrhage, hydrocephalus, extra-axial collection or mass lesion/mass effect. Stable atrophy and chronic microvascular ischemic changes. Vascular: Calcified atherosclerosis at the skullbase. No hyperdense vessel. Skull: Normal. Negative for fracture or focal lesion. Sinuses/Orbits: No acute finding. Chronic small amount of fluid in the right sphenoid sinus. Other: None. CT CERVICAL SPINE FINDINGS Alignment: No traumatic malalignment. Unchanged mild anterolisthesis at C7-T1. Skull base and vertebrae: No acute fracture. No primary bone lesion or focal pathologic process. Soft tissues and spinal canal: No prevertebral fluid or swelling. No visible canal hematoma. Disc levels: Fused C3-C4 and C4-C5. Unchanged severe C5-C6 and C6-C7 disc height loss. Upper chest: Unchanged biapical pleuroparenchymal scarring. Other: None. IMPRESSION: 1. No acute intracranial abnormality. 2. No acute cervical spine fracture or traumatic malalignment. Electronically Signed   By: Titus Dubin M.D.   On: 08/12/2020 13:02   CT Hip Right Wo Contrast  Result Date: 08/12/2020 CLINICAL DATA:  Right hip pain after a fall yesterday. Initial encounter. EXAM: CT OF THE RIGHT HIP WITHOUT CONTRAST TECHNIQUE: Multidetector CT imaging of the right hip was performed according to the standard protocol. Multiplanar CT image reconstructions were also generated. COMPARISON:  Plain films right hip today and 05/24/2020. Plain films left hip and pelvis 09/06/2019. CT abdomen and pelvis  05/02/2020. FINDINGS: Bones/Joint/Cartilage Streak artifact from the patient's hip arthroplasty limits evaluation. There is buckling of the anterior cortex of the proximal diaphysis of the femur as seen on images 2428 of series 9 worrisome for fracture. No other finding to suggest acute fracture is identified. Bones are osteopenic. Remote left pubic rami fractures are noted were present on the 2021 study. Ligaments Suboptimally assessed by CT. Muscles and Tendons Appear normal. Soft tissues Imaged intrapelvic contents demonstrate no acute or focal abnormality. IMPRESSION: Limited examination due to streak artifact from the patient's hip arthroplasty. Buckling of the anterior cortex of the proximal diaphysis of the femur worrisome for nondisplaced fracture. No other evidence of acute abnormality. Remote left pubic rami fractures. Electronically Signed   By: Inge Rise M.D.   On: 08/12/2020 15:49   DG Hip Unilat With Pelvis 2-3 Views Right  Result Date: 08/12/2020 CLINICAL DATA:  Trip and fall with right hip pain, initial encounter EXAM: DG HIP (WITH OR WITHOUT PELVIS) 2-3V RIGHT COMPARISON:  05/24/2020 FINDINGS: Right hip replacement is noted. The prosthesis appears well seated. Prior left pubic rami fractures are again noted. Subtle cortical irregularity is noted along the medial shaft of the femur adjacent to the prosthesis. Undisplaced fracture could not be totally excluded. IMPRESSION: Mild cortical irregularity along the medial aspect of the proximal femur adjacent to the prosthesis. This may represent an undisplaced fracture. CT may be helpful as clinically indicated. Healing left pubic rami fractures. Electronically Signed   By: Inez Catalina M.D.   On: 08/12/2020 12:25    Procedures Procedures   Medications Ordered in ED Medications  fentaNYL (SUBLIMAZE) injection 50 mcg (50 mcg Intravenous Given 08/12/20 1137)  ondansetron (ZOFRAN) injection 4 mg (4 mg Intravenous Given 08/12/20 1138)    ED  Course  I have reviewed the triage vital signs and the nursing notes.  Pertinent labs & imaging results that were available during my care of the patient were reviewed by me and considered in my medical decision making (see chart for details).  Clinical Course as of 08/12/20 1640  Thu Aug 12, 2020  1221 Patient had an episode where she felt like she was having difficulty breathing.  This was after getting her x-rays.  Patient appears to be breathing easily, lungs are clear.  We will start on supplemental oxygen for comfort although O2 saturation was stable in the mid 90s [JK]  1441 Head CT and C-spine CT without acute findings [JK]  1633 Lab contacted earlier.  They do not have her samples so had to recollect.  [JK]    Clinical Course User Index [JK] Dorie Rank, MD   MDM Rules/Calculators/A&P                          Pt with mechanical fall.  X-ray shows periprosthetic fracture.  Patient has been seen by orthopedic.  Anticipate she will need hospitalization for further treatment as she cannot ambulate right now and she lives at home.  Labs are pending.   Care turned over to Dr Tyrone Nine Final Clinical Impression(s) / ED Diagnoses Final diagnoses:  Closed fracture of right hip, initial encounter [facility])    Rx / [place] Orders ED Discharge Orders     None        Dorie Rank, MD 08/12/20 1640

## 2020-08-12 NOTE — ED Notes (Signed)
Patient was informed that report has been called. Bilateral PT/DP pulses are present. Cap. Refill is approx. 3 seconds. Patient is A/O but drowsy.

## 2020-08-12 NOTE — Plan of Care (Signed)
  Problem: Education: Goal: Knowledge of General Education information will improve Description Including pain rating scale, medication(s)/side effects and non-pharmacologic comfort measures Outcome: Progressing   Problem: Health Behavior/Discharge Planning: Goal: Ability to manage health-related needs will improve Outcome: Progressing   

## 2020-08-12 NOTE — ED Notes (Signed)
Hilbert Odor ortho PA in to talk with patient at this time. Patient will go to CT after evaluation.

## 2020-08-12 NOTE — ED Triage Notes (Signed)
Pt to ED from home by EMS for evaluation of right hip pain following a fall at home yesterday. Pt states she tripped over her comforter she was taking off the bed. Noted to have extern al rotation and shortening of the right leg.Bilateral DP/PT pulses present.

## 2020-08-12 NOTE — ED Notes (Signed)
Patient continues to rate pain 10/10 with movement only. Now states "I'm sleepy and relaxed." Declined offer for pain medication at this time. Patient's daughter is a bedside.

## 2020-08-12 NOTE — ED Provider Notes (Signed)
I received the patient in signout from Dr. Tomi Bamberger, briefly the patient is 87 year old female with multiple falls recently and now having pain and is unable to stand.  Plain film concerning for a periprosthetic hip fracture.  CT scan consistent with the same.  Seen by orthopedics and will review the patient post CT scan.  Blood work without concerning finding.  Will discuss with medicine for admission.   Deno Etienne, DO 08/12/20 1755

## 2020-08-12 NOTE — H&P (Signed)
History and Physical   [name] ZWC:[id number] DOB: April 07, 1916 DOA: 08/12/2020  PCP: Leonard Downing, MD   Patient coming from: Home  Chief Complaint: Fall, slipping  HPI: [name] is a 87 y.o. female with medical history significant of atrial fibrillation, CAD status post multiple PCI, diastolic heart failure, depression, anxiety, prior hip fracture, hypertension, hyperlipidemia, rectal cancer status post partial proctectomy, TIA who presents with hip pain following a fall at home. Patient reports having a fall at home yesterday.  She states she fell after she tripped over a comforter that she was taking off the bed.  She was unable to immediately stand but she did eventually call for help and EMS came to evaluate and subsequently brought her to the ED.  She initially denied hitting her head but had some head pain.  She states she is ambulatory at baseline with assistance of a walker.  Patient's niece states that she was somewhat weaker than her baseline for the past week after having a UTI and decreased p.o. intake while she was on the antibiotic.  She denies fevers, chills, chest pain, shortness of breath, abdominal pain, constipation, diarrhea, nausea, vomiting.  ED Course: Vital signs in ED are stable.  Lab work-up shows BMP with sodium 131, chloride 95.  CBC within normal limits.  PT, INR within normal limits.  Patient was typed and screened in the ED, respiratory panel for flu and COVID is pending.  Imaging showed chest x-ray with mild right atelectasis which is new, right hip x-ray showed questionable undisplaced periprosthetic fracture, CT of the right hip was again worrisome for findings consistent with periprosthetic nondisplaced fracture.  CT head and CT C-spine were without acute abnormality.  Orthopedics was consulted and recommended the CT and will evaluate.  Review of Systems: As per HPI otherwise all other systems reviewed and are negative.  Past Medical  History:  Diagnosis Date   Arthritis    FINGERS    CAD (coronary artery disease) CARDIOLOGIST-- DR Angelena Form   a. s/p INF STEMI 7/12: tx with BMS to RCA;  b. cath 08/26/10: pLAD 30%, mLAD 50%, D1 40%, pCFX 95%, mRCA occluded;   c. staged PCI of pCFX with BMS;   d. echo 7/12:   EF 60-65%, mild RAE, mild to moderate AI, mild MR, moderate TR, RVE, PASP 47   Complication of anesthesia PT STATES "MADE HER FEEL CRAZY"   Degeneration of eye    left eye cornea   Dyspnea    First degree heart block    Heart palpitations PAC'S AND SVT RUN'S  PER CARDIOLOGIST NOTE   History of ST elevation myocardial infarction (STEMI) 08-26-2010-- INFERIOR WALL   S/P PCI  BMS IN RCA AND PROX. CX   Hyperlipidemia    Hypertension    Impaired hearing BILATERAL HEARING AIDS   Osteopenia    PAF (paroxysmal atrial fibrillation) (HCC)    PONV (postoperative nausea and vomiting)    Pulmonary nodules BENIGN  PER CT  10-12-2010   Rectal Cancer 08/2010   adenocarcinoma   S/P PARTIAL PROCTECTOMY (NO CHEMO OR RADIATION)   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 01/11/2011   S/P colostomy ([place]) SECONDARY TO RECTOVAGINAL FISTULA   S/P coronary artery stent placement 08/2010   X2  BM    Past Surgical History:  Procedure Laterality Date   ABDOMINAL HYSTERECTOMY  1950's   AND APPENDECTOMY   CATARACT EXTRACTION W/ INTRAOCULAR LENS  IMPLANT, BILATERAL     CORONARY  ANGIOPLASTY WITH STENT PLACEMENT  08-26-2010  DR Taylor   PCI, BM STENT IN RCA   CORONARY ANGIOPLASTY WITH STENT PLACEMENT  08-29-2010  DR Newport   PCI, BM STENT IN PROXIMAL CIRCUMFLEX   EXCISION BENIGN CYST RIGHT BREAST     FISTULA PLUG N/A 06/21/2012   Procedure: insertion of FISTULA PLUG;  Surgeon: Leighton Ruff, MD;  Location: [facility];  Service: General;  Laterality: N/A;   FLEXIBLE SIGMOIDOSCOPY N/A 04/02/2012   Procedure: FLEXIBLE SIGMOIDOSCOPY;  Surgeon: Leighton Ruff, MD;  Location: [facility];  Service: Endoscopy;   Laterality: N/A;   KNEE SURGERY Right 1996   LAPROSCOPY LYSIS ADHESIONS/ DRAINAGE OF PELVIC ABSCESS/ DIVERTING LOOP SIGMOID COLECTOMY  11-22-2010   POST OP RECTOVAGINAL FISTULA   PARTIAL PROCTECTOMY BY TEM  11-17-2010   RECTAL CANCER   RELEASE LEFT CARPAL TUNNEL/ OSTEOTOMY LEFT DISTAL RADIUS  11-10-2009   STAPEDECTOMY  1970'S   TONSILLECTOMY  CHILD   TOTAL HIP ARTHROPLASTY Right 1992   TRANSTHORACIC ECHOCARDIOGRAM  10-10-2011   NORMAL LV SIZE WITH MILD FOCAL BASAL SEPTAL HYPERTROPHY/ EF 55-60%/ NORMAL RV SIZE AND LVSF/ BIATRIAL ENLARGEMENT/ MILD TO MODERATE AI  &  TR   TYMPANOPLASTY Left 12-23-2009    Social History  reports that she has never smoked. She has never used smokeless tobacco. She reports that she does not drink alcohol and does not use drugs.  Allergies  Allergen Reactions   Effexor [Venlafaxine Hydrochloride] Nausea And Vomiting   Penicillins Shortness Of Breath    Has patient had a PCN reaction causing immediate rash, facial/tongue/throat swelling, SOB or lightheadedness with hypotension: Yes Has patient had a PCN reaction causing severe rash involving mucus membranes or skin necrosis: No Has patient had a PCN reaction that required hospitalization: No Has patient had a PCN reaction occurring within the last 10 years: No If all of the above answers are "NO", then may proceed with Cephalosporin use.    Sulfa Antibiotics Nausea Only   Dicyclomine Other (See Comments)    Caused confusion   Lactose Intolerance (Gi) Diarrhea   Hydrocodone Nausea Only    Family History  Problem Relation Age of Onset   Kidney disease Mother    Cancer Brother   Reviewed on admission  Prior to Admission medications   Medication Sig Start Date End Date Taking? Authorizing Provider  acetaminophen (TYLENOL) 500 MG tablet Take 2 tablets (1,000 mg total) by mouth every 6 (six) hours as needed for mild pain. 07/06/15   Rama, Venetia Maxon, MD  amiodarone (PACERONE) 200 MG tablet 1 tab (200 mg  total) by mouth twice daily for 1 week followed by 1 tab (200 mg total) by mouth daily thereafter. Patient taking differently: Take 200 mg by mouth daily. 06/25/20   Burnell Blanks, MD  aspirin EC 81 MG tablet Take 81 mg by mouth every morning.    [provider]  beta carotene w/minerals (OCUVITE) tablet Take 1 tablet by mouth every morning.    [provider]  cholecalciferol (VITAMIN D3) 25 MCG (1000 UNIT) tablet Take 1,000 Units by mouth every morning.    [provider]  clonazePAM (KLONOPIN) 0.5 MG tablet Take 0.25 mg by mouth at bedtime. 05/12/20   [provider]  furosemide (LASIX) 40 MG tablet Take 20 mg by mouth every morning.    [provider]  levofloxacin (LEVAQUIN) 750 MG tablet Take 750 mg by mouth daily. 5 day supply 07/28/20   [provider]  melatonin  5 MG TABS Take 5 mg by mouth at bedtime.    [provider]  nitroGLYCERIN (NITROSTAT) 0.4 MG SL tablet Place 0.4 mg under the tongue every 5 (five) minutes as needed for chest pain.    [provider]  Polyethyl Glycol-Propyl Glycol (SYSTANE) 0.4-0.3 % GEL ophthalmic gel Place 1 drop into both eyes at bedtime.     [provider]  sertraline (ZOLOFT) 25 MG tablet Take 25 mg by mouth at bedtime. 10/28/19   [provider]   Physical Exam: Vitals:   08/12/20 1545 08/12/20 1748 08/12/20 1800 08/12/20 1830  BP:  (!) 130/48 (!) 129/46 (!) 140/52  Pulse: 60 60 (!) 58 61  Resp: 13 17 13 16   Temp:      TempSrc:      SpO2: 98% 100% 100% 100%  Weight:      Height:       Physical Exam Constitutional:      General: She is not in acute distress.    Comments: Thin, frail, elderly appearing female  HENT:     Head: Normocephalic and atraumatic.     Mouth/Throat:     Mouth: Mucous membranes are moist.     Pharynx: Oropharynx is clear.  Eyes:     Extraocular Movements: Extraocular movements intact.     Pupils: Pupils are equal, round,  and reactive to light.  Cardiovascular:     Rate and Rhythm: Normal rate and regular rhythm.     Pulses: Normal pulses.     Heart sounds: Normal heart sounds.  Pulmonary:     Effort: Pulmonary effort is normal. No respiratory distress.     Breath sounds: Normal breath sounds.  Abdominal:     General: Bowel sounds are normal. There is no distension.     Palpations: Abdomen is soft.     Tenderness: There is no abdominal tenderness.  Musculoskeletal:        General: Tenderness (right hip) present. No swelling or deformity.     Comments: Bilateral lower extremities are neurovascularly intact  Skin:    General: Skin is warm and dry.  Neurological:     General: No focal deficit present.     Mental Status: Mental status is at baseline.   Labs on Admission: I have personally reviewed following labs and imaging studies  CBC: Recent Labs  Lab 08/12/20 1600  WBC 8.3  NEUTROABS 5.5  HGB 12.7  HCT 38.9  MCV 95.1  PLT 630    Basic Metabolic Panel: Recent Labs  Lab 08/12/20 1600  NA 131*  K 4.1  CL 95*  CO2 25  GLUCOSE 95  BUN 16  CREATININE 0.84  CALCIUM 9.2    GFR: Estimated Creatinine Clearance: 22.8 mL/min (by C-G formula based on SCr of 0.84 mg/dL).  Liver Function Tests: No results for input(s): AST, ALT, ALKPHOS, BILITOT, PROT, ALBUMIN in the last 168 hours.  Urine analysis:    Component Value Date/Time   COLORURINE YELLOW 07/31/2020 2255   APPEARANCEUR CLEAR 07/31/2020 2255   LABSPEC 1.009 07/31/2020 2255   PHURINE 6.0 07/31/2020 2255   GLUCOSEU NEGATIVE 07/31/2020 2255   HGBUR NEGATIVE 07/31/2020 2255   BILIRUBINUR NEGATIVE 07/31/2020 2255   [place] 07/31/2020 2255   PROTEINUR NEGATIVE 07/31/2020 2255   UROBILINOGEN 0.2 10/01/2014 1628   NITRITE NEGATIVE 07/31/2020 2255   LEUKOCYTESUR LARGE (A) 07/31/2020 2255    Radiological Exams on Admission: DG Chest 1 View  Result Date: 08/12/2020 CLINICAL DATA:  Fall.  EXAM: CHEST  1 VIEW  COMPARISON:  07/31/2020.  06/29/2020. FINDINGS: Cardiomegaly. No pulmonary venous congestion. New mild atelectatic changes right mid lung. Follow-up to demonstrate clearing suggested. No pleural effusion or pneumothorax. Cardiomegaly. No pulmonary venous congestion. Stable deformity left humerus. No acute bony abnormality identified. IMPRESSION: 1. New mild atelectatic changes noted the right lung. Follow-up exam suggested demonstrate resolution. 2.  Stable cardiomegaly. 3. Stable deformity left humerus. No acute bony abnormality identified. No pneumothorax. Electronically Signed   By: Marcello Moores  Register   On: 08/12/2020 12:25   CT HEAD WO CONTRAST  Result Date: 08/12/2020 CLINICAL DATA:  [place] yesterday. EXAM: CT HEAD WITHOUT CONTRAST CT CERVICAL SPINE WITHOUT CONTRAST TECHNIQUE: Multidetector CT imaging of the head and cervical spine was performed following the standard protocol without intravenous contrast. Multiplanar CT image reconstructions of the cervical spine were also generated. COMPARISON:  CT head dated July 31, 2020. CT cervical spine dated Jun 29, 2020. FINDINGS: CT HEAD FINDINGS Brain: No evidence of acute infarction, hemorrhage, hydrocephalus, extra-axial collection or mass lesion/mass effect. Stable atrophy and chronic microvascular ischemic changes. Vascular: Calcified atherosclerosis at the skullbase. No hyperdense vessel. Skull: Normal. Negative for fracture or focal lesion. Sinuses/Orbits: No acute finding. Chronic small amount of fluid in the right sphenoid sinus. Other: None. CT CERVICAL SPINE FINDINGS Alignment: No traumatic malalignment. Unchanged mild anterolisthesis at C7-T1. Skull base and vertebrae: No acute fracture. No primary bone lesion or focal pathologic process. Soft tissues and spinal canal: No prevertebral fluid or swelling. No visible canal hematoma. Disc levels: Fused C3-C4 and C4-C5. Unchanged severe C5-C6 and C6-C7 disc height loss. Upper chest: Unchanged biapical  pleuroparenchymal scarring. Other: None. IMPRESSION: 1. No acute intracranial abnormality. 2. No acute cervical spine fracture or traumatic malalignment. Electronically Signed   By: Titus Dubin M.D.   On: 08/12/2020 13:02   CT CERVICAL SPINE WO CONTRAST  Result Date: 08/12/2020 CLINICAL DATA:  [place] yesterday. EXAM: CT HEAD WITHOUT CONTRAST CT CERVICAL SPINE WITHOUT CONTRAST TECHNIQUE: Multidetector CT imaging of the head and cervical spine was performed following the standard protocol without intravenous contrast. Multiplanar CT image reconstructions of the cervical spine were also generated. COMPARISON:  CT head dated July 31, 2020. CT cervical spine dated Jun 29, 2020. FINDINGS: CT HEAD FINDINGS Brain: No evidence of acute infarction, hemorrhage, hydrocephalus, extra-axial collection or mass lesion/mass effect. Stable atrophy and chronic microvascular ischemic changes. Vascular: Calcified atherosclerosis at the skullbase. No hyperdense vessel. Skull: Normal. Negative for fracture or focal lesion. Sinuses/Orbits: No acute finding. Chronic small amount of fluid in the right sphenoid sinus. Other: None. CT CERVICAL SPINE FINDINGS Alignment: No traumatic malalignment. Unchanged mild anterolisthesis at C7-T1. Skull base and vertebrae: No acute fracture. No primary bone lesion or focal pathologic process. Soft tissues and spinal canal: No prevertebral fluid or swelling. No visible canal hematoma. Disc levels: Fused C3-C4 and C4-C5. Unchanged severe C5-C6 and C6-C7 disc height loss. Upper chest: Unchanged biapical pleuroparenchymal scarring. Other: None. IMPRESSION: 1. No acute intracranial abnormality. 2. No acute cervical spine fracture or traumatic malalignment. Electronically Signed   By: Titus Dubin M.D.   On: 08/12/2020 13:02   CT Hip Right Wo Contrast  Result Date: 08/12/2020 CLINICAL DATA:  Right hip pain after a fall yesterday. Initial encounter. EXAM: CT OF THE RIGHT HIP WITHOUT CONTRAST  TECHNIQUE: Multidetector CT imaging of the right hip was performed according to the standard protocol. Multiplanar CT image reconstructions were also generated. COMPARISON:  Plain films right hip today and 05/24/2020. Plain  films left hip and pelvis 09/06/2019. CT abdomen and pelvis 05/02/2020. FINDINGS: Bones/Joint/Cartilage Streak artifact from the patient's hip arthroplasty limits evaluation. There is buckling of the anterior cortex of the proximal diaphysis of the femur as seen on images 2428 of series 9 worrisome for fracture. No other finding to suggest acute fracture is identified. Bones are osteopenic. Remote left pubic rami fractures are noted were present on the 2021 study. Ligaments Suboptimally assessed by CT. Muscles and Tendons Appear normal. Soft tissues Imaged intrapelvic contents demonstrate no acute or focal abnormality. IMPRESSION: Limited examination due to streak artifact from the patient's hip arthroplasty. Buckling of the anterior cortex of the proximal diaphysis of the femur worrisome for nondisplaced fracture. No other evidence of acute abnormality. Remote left pubic rami fractures. Electronically Signed   By: Inge Rise M.D.   On: 08/12/2020 15:49   DG Hip Unilat With Pelvis 2-3 Views Right  Result Date: 08/12/2020 CLINICAL DATA:  Trip and fall with right hip pain, initial encounter EXAM: DG HIP (WITH OR WITHOUT PELVIS) 2-3V RIGHT COMPARISON:  05/24/2020 FINDINGS: Right hip replacement is noted. The prosthesis appears well seated. Prior left pubic rami fractures are again noted. Subtle cortical irregularity is noted along the medial shaft of the femur adjacent to the prosthesis. Undisplaced fracture could not be totally excluded. IMPRESSION: Mild cortical irregularity along the medial aspect of the proximal femur adjacent to the prosthesis. This may represent an undisplaced fracture. CT may be helpful as clinically indicated. Healing left pubic rami fractures. Electronically  Signed   By: Inez Catalina M.D.   On: 08/12/2020 12:25    EKG: Independently reviewed.  Sinus rhythm versus ectopic, P waves are difficult to discern.  Rate 67 bpm.  Left bundle branch block.  Similar to previous  Assessment/Plan Principal Problem:   Closed fracture of right femur ([place]) Active Problems:   Rectal cancer, pT2uN0(pNX) s/p TEM partial proctectomy   CAD (coronary artery disease)   HTN (hypertension)   PAF (paroxysmal atrial fibrillation) (HCC)   Atrial flutter (HCC)   Chronic diastolic CHF (congestive heart failure) (HCC)  Closed fracture of right femur > Patient presented after a fall when she tripped over comforter.  Unable to walk after fall.  Ambulatory prior to this.  X-ray and CT showed of findings concerning for nondisplaced periprosthetic fracture of the right femur. - Ortho has been consulted, appreciate recommendations - Bedrest for now pending Ortho recommendations based on imaging - Pain control with Norco for moderate pain and Dilaudid for breakthrough pain - Hip/femur fracture protocol for positioning and care  History of rectal cancer > Status post partial proctectomy about 10 years ago with colostomy in place. - WOC consult  Atrial flutter - Continue home amiodarone  CAD > Status post multiple PCI including 2 bare-metal stents - Continue home aspirin  Hypertension Diastolic CHF > Echo in March with EF 60-65%, mild hypertrophy, dilated right atria - Continue daily Lasix  Depression Anxiety - Continue home Zoloft, melatonin  DVT prophylaxis: SCDs for now  Code Status:   DNR  Family Communication:  Niece who is POA contacted by phone and updated. Disposition Plan:   Patient is from:  Home  Anticipated DC to:  SNF  Anticipated DC date:  2- 3 days  Anticipated DC barriers: None  Consults called:  Orthopedics consulted by EDP  Admission status:  Inpatient, telemetry  Severity of Illness: The appropriate patient status for this patient is  INPATIENT. Inpatient status is judged to be reasonable  and necessary in order to provide the required intensity of service to ensure the patient's safety. The patient's presenting symptoms, physical exam findings, and initial radiographic and laboratory data in the context of their chronic comorbidities is felt to place them at high risk for further clinical deterioration. Furthermore, it is not anticipated that the patient will be medically stable for discharge from the hospital within 2 midnights of admission. The following factors support the patient status of inpatient.   " The patient's presenting symptoms include hip pain, fall. " The worrisome physical exam findings include hip pain, thin frail elderly lady.. " The initial radiographic and laboratory data are worrisome because of right hip x-ray and CT of right hip worrisome for findings consistent with periprosthetic undisplaced fracture. " The chronic co-morbidities include atrial fibrillation, CAD, diastolic heart failure, depression, anxiety, hypertension, hyperlipidemia, history of rectal cancer, TIA.   * I certify that at the point of admission it is my clinical judgment that the patient will require inpatient hospital care spanning beyond 2 midnights from the point of admission due to high intensity of service, high risk for further deterioration and high frequency of surveillance required.Marcelyn Bruins MD [facility]  How to contact the [facility] Attending or Consulting provider [place] or covering provider during after hours [place], for this patient?   Check the care team in [facility] and look for a) attending/consulting [facility] provider listed and b) the [facility] team listed Log into [email or web address] and use [place]'s universal password to access. If you do not have the password, please contact the hospital operator. Locate the [facility] provider you are looking for under [facility] and page to a number that you can be directly  reached. If you still have difficulty reaching the provider, please page the [facility] (Director on Call) for the Hospitalists listed on amion for assistance.  08/12/2020, 7:48 PM

## 2020-08-13 DIAGNOSIS — I480 Paroxysmal atrial fibrillation: Secondary | ICD-10-CM | POA: Diagnosis not present

## 2020-08-13 DIAGNOSIS — I110 Hypertensive heart disease with heart failure: Secondary | ICD-10-CM | POA: Diagnosis not present

## 2020-08-13 DIAGNOSIS — Z955 Presence of coronary angioplasty implant and graft: Secondary | ICD-10-CM | POA: Diagnosis not present

## 2020-08-13 DIAGNOSIS — S7291XA Unspecified fracture of right femur, initial encounter for closed fracture: Secondary | ICD-10-CM | POA: Diagnosis not present

## 2020-08-13 DIAGNOSIS — I5032 Chronic diastolic (congestive) heart failure: Secondary | ICD-10-CM | POA: Diagnosis not present

## 2020-08-13 DIAGNOSIS — Z96641 Presence of right artificial hip joint: Secondary | ICD-10-CM | POA: Diagnosis not present

## 2020-08-13 DIAGNOSIS — W010XXA Fall on same level from slipping, tripping and stumbling without subsequent striking against object, initial encounter: Secondary | ICD-10-CM | POA: Diagnosis present

## 2020-08-13 DIAGNOSIS — R739 Hyperglycemia, unspecified: Secondary | ICD-10-CM | POA: Diagnosis not present

## 2020-08-13 DIAGNOSIS — M25551 Pain in right hip: Secondary | ICD-10-CM | POA: Diagnosis present

## 2020-08-13 DIAGNOSIS — I2510 Atherosclerotic heart disease of native coronary artery without angina pectoris: Secondary | ICD-10-CM | POA: Diagnosis not present

## 2020-08-13 DIAGNOSIS — M9701XA Periprosthetic fracture around internal prosthetic right hip joint, initial encounter: Secondary | ICD-10-CM | POA: Diagnosis not present

## 2020-08-13 DIAGNOSIS — K7469 Other cirrhosis of liver: Secondary | ICD-10-CM | POA: Diagnosis not present

## 2020-08-13 DIAGNOSIS — E871 Hypo-osmolality and hyponatremia: Secondary | ICD-10-CM | POA: Diagnosis not present

## 2020-08-13 DIAGNOSIS — M25559 Pain in unspecified hip: Secondary | ICD-10-CM | POA: Diagnosis present

## 2020-08-13 DIAGNOSIS — Z79899 Other long term (current) drug therapy: Secondary | ICD-10-CM | POA: Diagnosis not present

## 2020-08-13 DIAGNOSIS — Z85048 Personal history of other malignant neoplasm of rectum, rectosigmoid junction, and anus: Secondary | ICD-10-CM | POA: Diagnosis not present

## 2020-08-13 DIAGNOSIS — S72091A Other fracture of head and neck of right femur, initial encounter for closed fracture: Secondary | ICD-10-CM | POA: Diagnosis not present

## 2020-08-13 DIAGNOSIS — R296 Repeated falls: Secondary | ICD-10-CM | POA: Diagnosis not present

## 2020-08-13 DIAGNOSIS — D649 Anemia, unspecified: Secondary | ICD-10-CM | POA: Diagnosis not present

## 2020-08-13 DIAGNOSIS — D696 Thrombocytopenia, unspecified: Secondary | ICD-10-CM | POA: Diagnosis not present

## 2020-08-13 DIAGNOSIS — Z681 Body mass index (BMI) 19 or less, adult: Secondary | ICD-10-CM | POA: Diagnosis not present

## 2020-08-13 DIAGNOSIS — I252 Old myocardial infarction: Secondary | ICD-10-CM | POA: Diagnosis not present

## 2020-08-13 DIAGNOSIS — I4892 Unspecified atrial flutter: Secondary | ICD-10-CM | POA: Diagnosis not present

## 2020-08-13 DIAGNOSIS — F418 Other specified anxiety disorders: Secondary | ICD-10-CM | POA: Diagnosis not present

## 2020-08-13 DIAGNOSIS — Z66 Do not resuscitate: Secondary | ICD-10-CM | POA: Diagnosis not present

## 2020-08-13 DIAGNOSIS — Z8673 Personal history of transient ischemic attack (TIA), and cerebral infarction without residual deficits: Secondary | ICD-10-CM | POA: Diagnosis not present

## 2020-08-13 DIAGNOSIS — E440 Moderate protein-calorie malnutrition: Secondary | ICD-10-CM | POA: Diagnosis not present

## 2020-08-13 DIAGNOSIS — Z7982 Long term (current) use of aspirin: Secondary | ICD-10-CM | POA: Diagnosis not present

## 2020-08-13 DIAGNOSIS — Z20822 Contact with and (suspected) exposure to covid-19: Secondary | ICD-10-CM | POA: Diagnosis not present

## 2020-08-13 LAB — CBC WITH DIFFERENTIAL/PLATELET
Abs Immature Granulocytes: 0.02 10*3/uL (ref 0.00–0.07)
Basophils Absolute: 0 10*3/uL (ref 0.0–0.1)
Basophils Relative: 0 %
Eosinophils Absolute: 0.1 10*3/uL (ref 0.0–0.5)
Eosinophils Relative: 2 %
HCT: 35.4 % — ABNORMAL LOW (ref 36.0–46.0)
Hemoglobin: 11.3 g/dL — ABNORMAL LOW (ref 12.0–15.0)
Immature Granulocytes: 0 %
Lymphocytes Relative: 25 %
Lymphs Abs: 1.8 10*3/uL (ref 0.7–4.0)
MCH: 30.8 pg (ref 26.0–34.0)
MCHC: 31.9 g/dL (ref 30.0–36.0)
MCV: 96.5 fL (ref 80.0–100.0)
Monocytes Absolute: 0.6 10*3/uL (ref 0.1–1.0)
Monocytes Relative: 8 %
Neutro Abs: 4.6 10*3/uL (ref 1.7–7.7)
Neutrophils Relative %: 65 %
Platelets: 147 10*3/uL — ABNORMAL LOW (ref 150–400)
RBC: 3.67 MIL/uL — ABNORMAL LOW (ref 3.87–5.11)
RDW: 15.8 % — ABNORMAL HIGH (ref 11.5–15.5)
WBC: 7.1 10*3/uL (ref 4.0–10.5)
nRBC: 0 % (ref 0.0–0.2)

## 2020-08-13 LAB — COMPREHENSIVE METABOLIC PANEL
ALT: 14 U/L (ref 0–44)
AST: 23 U/L (ref 15–41)
Albumin: 2.9 g/dL — ABNORMAL LOW (ref 3.5–5.0)
Alkaline Phosphatase: 65 U/L (ref 38–126)
Anion gap: 7 (ref 5–15)
BUN: 15 mg/dL (ref 8–23)
CO2: 27 mmol/L (ref 22–32)
Calcium: 9.3 mg/dL (ref 8.9–10.3)
Chloride: 102 mmol/L (ref 98–111)
Creatinine, Ser: 0.89 mg/dL (ref 0.44–1.00)
GFR, Estimated: 57 mL/min — ABNORMAL LOW (ref >60)
Glucose, Bld: 119 mg/dL — ABNORMAL HIGH (ref 70–99)
Potassium: 4.2 mmol/L (ref 3.5–5.1)
Sodium: 136 mmol/L (ref 135–145)
Total Bilirubin: 0.7 mg/dL (ref 0.3–1.2)
Total Protein: 6.2 g/dL — ABNORMAL LOW (ref 6.5–8.1)

## 2020-08-13 LAB — SARS CORONAVIRUS 2 (TAT 6-24 HRS): SARS Coronavirus 2: NEGATIVE

## 2020-08-13 LAB — PHOSPHORUS: Phosphorus: 2.9 mg/dL (ref 2.5–4.6)

## 2020-08-13 LAB — MAGNESIUM: Magnesium: 1.8 mg/dL (ref 1.7–2.4)

## 2020-08-13 MED ORDER — ENSURE ENLIVE PO LIQD
237.0000 mL | Freq: Two times a day (BID) | ORAL | Status: DC
  Administered 2020-08-14 – 2020-08-17 (×4): 237 mL via ORAL

## 2020-08-13 MED ORDER — ADULT MULTIVITAMIN W/MINERALS CH
1.0000 | ORAL_TABLET | Freq: Every day | ORAL | Status: DC
  Administered 2020-08-13 – 2020-08-18 (×6): 1 via ORAL
  Filled 2020-08-13 (×6): qty 1

## 2020-08-13 MED ORDER — ONDANSETRON HCL 4 MG/2ML IJ SOLN
INTRAMUSCULAR | Status: AC
  Administered 2020-08-13: 4 mg
  Filled 2020-08-13: qty 2

## 2020-08-13 MED ORDER — ONDANSETRON HCL 4 MG/2ML IJ SOLN
4.0000 mg | Freq: Four times a day (QID) | INTRAMUSCULAR | Status: DC | PRN
  Administered 2020-08-15: 4 mg via INTRAVENOUS
  Filled 2020-08-13: qty 2

## 2020-08-13 NOTE — Progress Notes (Signed)
Initial Nutrition Assessment  DOCUMENTATION CODES:   Underweight, Non-severe (moderate) malnutrition in context of chronic illness  INTERVENTION:   -Ensure Enlive po BID, each supplement provides 350 kcal and 20 grams of protein  -MVI with minerals daily  NUTRITION DIAGNOSIS:   Moderate Malnutrition related to chronic illness (CHF) as evidenced by mild fat depletion, moderate fat depletion, mild muscle depletion, moderate muscle depletion.  GOAL:   Patient will meet greater than or equal to 90% of their needs  MONITOR:   PO intake, Supplement acceptance, Labs, Weight trends, Skin, I & O's  REASON FOR ASSESSMENT:   Consult Assessment of nutrition requirement/status, Hip fracture protocol  ASSESSMENT:   [name] is a 87 y.o. female with medical history significant of atrial fibrillation, CAD status post multiple PCI, diastolic heart failure, depression, anxiety, prior hip fracture, hypertension, hyperlipidemia, rectal cancer status post partial proctectomy, TIA who presents with hip pain following a fall at home.  Pt admitted with rt femur fracture.   Reviewed I/O's: -200 ml x 24 hours  UOP: 200 ml x 24 hours  Per orthopedics notes, likely plan for non-operative interventions.   Spoke with pt at bedside, who reports poor appetite. She shares that she is trying to eat, even though she doesn't feel like it- observed pt consumed about 75% of her breakfast tray.   PTA, pt consumed 3 meals per day (Breakfast: oatmeal OR egg and toast; Lunch: meat and vegetable; Dinner: sandwich; Snack: bowl of oatmeal).   Reviewed wt hx; pt has experienced a 5.3% wt loss over the past 6 months, which is not significant for time frame. Per pt, she reports her UBW is around 115# and suspects she has lost 20 pounds over the past 3 months.   Discussed importance of good meal and supplement intake to promote healing. Pt amenable to Ensure. RD also assisted pt with setting up her meal tray.    Medications reviewed.   Labs reviewed: Na: 131.  NUTRITION - FOCUSED PHYSICAL EXAM:  Flowsheet Row Most Recent Value  Orbital Region Moderate depletion  Upper Arm Region Moderate depletion  Thoracic and Lumbar Region Mild depletion  Buccal Region Moderate depletion  Temple Region Moderate depletion  Clavicle Bone Region Moderate depletion  Clavicle and Acromion Bone Region Moderate depletion  Scapular Bone Region Moderate depletion  Dorsal Hand Mild depletion  Patellar Region Moderate depletion  Anterior Thigh Region Moderate depletion  Posterior Calf Region Moderate depletion  Edema (RD Assessment) None  Hair Reviewed  Eyes Reviewed  Mouth Reviewed  Skin Reviewed  Nails Reviewed       Diet Order:   Diet Order             Diet Heart Room service appropriate? Yes; Fluid consistency: Thin  Diet effective now                   EDUCATION NEEDS:   Education needs have been addressed  Skin:  Skin Assessment: Reviewed RN Assessment  Last BM:  08/12/20 (via colostomy)  Height:   Ht Readings from Last 1 Encounters:  08/12/20 5\' 3"  (1.6 m)    Weight:   Wt Readings from Last 1 Encounters:  08/12/20 42.6 kg    Ideal Body Weight:  52.3 kg  BMI:  Body mass index is 16.65 kg/m.  Estimated Nutritional Needs:   Kcal:  1250-1450  Protein:  55-70 grams  Fluid:  > 1.2 L    Loistine Chance, RD, LDN, [place] Registered Dietitian II  Certified Diabetes Care and Education Specialist Please refer to [facility] for RD and/or RD on-call/weekend/after hours pager

## 2020-08-13 NOTE — Consult Note (Signed)
[place] Nurse ostomy consult note Patient receiving care in [facility] 5N30 Stoma type/location: LLQ colostomy Patient states that she has had colostomy since 2012. She is currently using a 2 pc pouching system. 2 pc pouch Kellie Simmering # [phone]) skin barrier Kellie Simmering # [phone]) barrier rings Kellie Simmering # [phone]). Current pouch in place with brown solid stool. Bedside RN to assist patient with pouch changes.  2 piece pouching system     Thank you for the consult. [place] nurse will not follow at this time.   Please re-consult the [place] team if needed.  Cathlean Marseilles Tamala Julian, MSN, RN, [place], [place]le Pearl, [facility] Wound Treatment Associate Pager [phone]

## 2020-08-13 NOTE — Progress Notes (Addendum)
PROGRESS NOTE    [name]  JJH:[id number] DOB: 01-26-1917 DOA: 08/12/2020 PCP: Leonard Downing, MD   Brief Narrative: The patient is 87 year old elderly Caucasian female with a past medical history significant for but not limited to atrial fibrillation, CAD status post multiple PCI, history of chronic diastolic CHF, depression and anxiety, history of prior hip fracture, hypertension, hyperlipidemia, rectal cancer status post partial proctectomy, history of TIA as well as other comorbidities who presents with a chief complaint of hip pain following a fall at home.  The patient states that she fell after she tripped over a comforter that she was taken off the bed.  She is unable to stand immediately but she was able to call for help and EMS came to evaluate and brought her to the ED.  She denied hitting her head but did endorse some head pain.  She states that she is ambulatory at baseline with assistance of a walker.  Patient's niece stated that she was somewhat weaker than her baseline for the last week or so after having a UTI and had decreased p.o. intake while she was on antibiotics.  She underwent further work-up and chest x-ray showed mild right atelectasis and a hip x-ray showed a questionable nondisplaced periprosthetic fracture.  CT of the right hip was done and was worrisome for findings consistent with a periprosthetic nondisplaced fracture however orthopedic surgery evaluated and recommended nonoperative management and recommended weightbearing as tolerated.  She had a head CT and CT spine that were done without any acute abnormalities.  PT OT evaluated and now recommending SNF   Assessment & Plan:   Principal Problem:   Closed fracture of right femur ([place]) Active Problems:   Rectal cancer, pT2uN0(pNX) s/p TEM partial proctectomy   CAD (coronary artery disease)   HTN (hypertension)   PAF (paroxysmal atrial fibrillation) (HCC)   Atrial flutter (HCC)   Chronic diastolic CHF  (congestive heart failure) (HCC)  Closed fracture of right femur -Patient presented after a fall when she tripped over comforter.  Unable to walk after fall.  Ambulatory prior to this with a walker.  -X-ray and CT showed of findings concerning for nondisplaced periprosthetic fracture of the right femur. - Ortho has been consulted, appreciate recommendations and they are recommending Non-operative Management and WBAT -Pain control with Norco  5-325 mg 1 tab po q6hprn for moderate pain and Dilaudid 0.4 mg sL for breakthrough pain -Hip/femur fracture protocol for positioning and care -PT/OT recommending SNF   History of rectal cancer -Status post partial proctectomy about 10 years ago with colostomy in place. -WOC consult   Atrial Flutter -Continue home Amiodarone 200 mg po Daily -Continue to Monitor on Telemetry   CAD -Status post multiple PCI including 2 bare-metal stents -Continue home aspirin 81 mg po Daily and continue NTG 0.4 SL q63min PRN Chest Pain   Hypertension -Continue to Monitor BP per Protocol   Chronic Diastolic CHF -Echo in March with EF 60-65%, mild hypertrophy, dilated right atria -Continue daily Lasix 20 mg po qHS -Strict I's and O's and Daily Weights   Hyponatremia -Improved. Na+ went from 131 -> 136 -Continue to Monitor and Trend -Repeat CMP in the AM  Thrombocytopenia -Mild. Platelet Count went from 176 -> 147 -Continue to Monitor for S/Sx of Bleeding -Repeat CBC in the AM  Normocytic Anemia  -Patient's Hgb/Hct went from 12.7/38.9 -> 11.3/35.4 -Check Anemia Panel in the AM -Continue to Monitor for S/Sx of Bleeding; No overt bleeding noted -Repeat CBC  in the AM    Depression and Anxiety -Continue home Sertraline 25 mg po qHS, melatonin   Underweight and Non-Severe Moderate Malnutrition in the Context of Chronic illness -Nutritionist consulted for further evaluation and Recc's  -They are recommending Ensure Enlive po BID  -C/w MVI Minerals Daily    DVT prophylaxis: SCDs Code Status: DO NOT RESUSCITATE  Family Communication: No family present at bedside  Disposition Plan: SNF  Status is: Observation  The patient remains OBS appropriate and will d/c before 2 midnights.  Dispo: The patient is from: Home              Anticipated d/c is to: SNF              Patient currently is not medically stable to d/c.   Difficult to place patient No  Consultants:  Orthopedic Surgery  Procedures: CT Scan Hip  Antimicrobials:  Anti-infectives (From admission, onward)    None        Subjective: Seen and examined at bedside and was having some hip pain.  No nausea or vomiting.  Felt okay.  Afraid to ambulate.  Denies any shortness of breath.  No other concerns or complaints at this time.  Objective: Vitals:   08/12/20 2015 08/12/20 2125 08/13/20 0739 08/13/20 1051  BP: (!) 138/51 120/79 (!) 108/51 (!) 121/44  Pulse: (!) 59 78 (!) 58 (!) 59  Resp: 14 16 16    Temp:  98.7 F (37.1 C) 98 F (36.7 C)   TempSrc:   Oral   SpO2: 100%  98% 93%  Weight:      Height:        Intake/Output Summary (Last 24 hours) at 08/13/2020 1517 Last data filed at 08/13/2020 0945 Gross per 24 hour  Intake 120 ml  Output 200 ml  Net -80 ml   Filed Weights   08/12/20 1128  Weight: 42.6 kg   Examination: Physical Exam:  Constitutional: Thin elderly cachectic Caucasian female currently in no acute distress appears calm but does appear mildly uncomfortable Eyes: Lids and conjunctivae normal, sclerae anicteric  ENMT: External Ears, Nose appear normal. Grossly normal hearing.  Neck: Appears normal, supple, no cervical masses, normal ROM, no appreciable thyromegaly; no JVD Respiratory: Diminished to auscultation bilaterally, no wheezing, rales, rhonchi or crackles. Normal respiratory effort and patient is not tachypenic. No accessory muscle use.  Unlabored breathing Cardiovascular: RRR, no murmurs / rubs / gallops. S1 and S2 auscultated. No  edema Abdomen: Soft, non-tender, non-distended.  Has an ostomy bag in place bowel sounds positive.  GU: Deferred. Musculoskeletal: No clubbing / cyanosis of digits/nails. No joint deformity upper and lower extremities.  Skin: No rashes, lesions, ulcers on limited skin evaluation. No induration; Warm and dry.  Neurologic: CN 2-12 grossly intact with no focal deficits. Romberg sign and cerebellar reflexes not assessed.  Psychiatric: Normal judgment and insight. Alert and oriented x 3. Normal mood and appropriate affect.   Data Reviewed: I have personally reviewed following labs and imaging studies  CBC: Recent Labs  Lab 08/12/20 1600 08/13/20 1035  WBC 8.3 7.1  NEUTROABS 5.5 4.6  HGB 12.7 11.3*  HCT 38.9 35.4*  MCV 95.1 96.5  PLT 176 474*   Basic Metabolic Panel: Recent Labs  Lab 08/12/20 1600 08/13/20 1035  NA 131* 136  K 4.1 4.2  CL 95* 102  CO2 25 27  GLUCOSE 95 119*  BUN 16 15  CREATININE 0.84 0.89  CALCIUM 9.2 9.3  MG  --  1.8  PHOS  --  2.9   GFR: Estimated Creatinine Clearance: 21.5 mL/min (by C-G formula based on SCr of 0.89 mg/dL). Liver Function Tests: Recent Labs  Lab 08/13/20 1035  AST 23  ALT 14  ALKPHOS 65  BILITOT 0.7  PROT 6.2*  ALBUMIN 2.9*   No results for input(s): LIPASE, AMYLASE in the last 168 hours. No results for input(s): AMMONIA in the last 168 hours. Coagulation Profile: Recent Labs  Lab 08/12/20 1600  INR 1.0   Cardiac Enzymes: No results for input(s): CKTOTAL, CKMB, CKMBINDEX, TROPONINI in the last 168 hours. BNP (last 3 results) No results for input(s): PROBNP in the last 8760 hours. HbA1C: No results for input(s): HGBA1C in the last 72 hours. CBG: No results for input(s): GLUCAP in the last 168 hours. Lipid Profile: No results for input(s): CHOL, HDL, LDLCALC, TRIG, CHOLHDL, LDLDIRECT in the last 72 hours. Thyroid Function Tests: No results for input(s): TSH, T4TOTAL, FREET4, T3FREE, THYROIDAB in the last 72  hours. Anemia Panel: No results for input(s): VITAMINB12, FOLATE, FERRITIN, TIBC, IRON, RETICCTPCT in the last 72 hours. Sepsis Labs: No results for input(s): PROCALCITON, LATICACIDVEN in the last 168 hours.  No results found for this or any previous visit (from the past 240 hour(s)).   RN Pressure Injury Documentation:     Estimated body mass index is 16.65 kg/m as calculated from the following:   Height as of this encounter: 5\' 3"  (1.6 m).   Weight as of this encounter: 42.6 kg.  Malnutrition Type:   Malnutrition Characteristics:   Nutrition Interventions:     Radiology Studies: DG Chest 1 View  Result Date: 08/12/2020 CLINICAL DATA:  Fall. EXAM: CHEST  1 VIEW COMPARISON:  07/31/2020.  06/29/2020. FINDINGS: Cardiomegaly. No pulmonary venous congestion. New mild atelectatic changes right mid lung. Follow-up to demonstrate clearing suggested. No pleural effusion or pneumothorax. Cardiomegaly. No pulmonary venous congestion. Stable deformity left humerus. No acute bony abnormality identified. IMPRESSION: 1. New mild atelectatic changes noted the right lung. Follow-up exam suggested demonstrate resolution. 2.  Stable cardiomegaly. 3. Stable deformity left humerus. No acute bony abnormality identified. No pneumothorax. Electronically Signed   By: Marcello Moores  Register   On: 08/12/2020 12:25   CT HEAD WO CONTRAST  Result Date: 08/12/2020 CLINICAL DATA:  [place] yesterday. EXAM: CT HEAD WITHOUT CONTRAST CT CERVICAL SPINE WITHOUT CONTRAST TECHNIQUE: Multidetector CT imaging of the head and cervical spine was performed following the standard protocol without intravenous contrast. Multiplanar CT image reconstructions of the cervical spine were also generated. COMPARISON:  CT head dated July 31, 2020. CT cervical spine dated Jun 29, 2020. FINDINGS: CT HEAD FINDINGS Brain: No evidence of acute infarction, hemorrhage, hydrocephalus, extra-axial collection or mass lesion/mass effect. Stable atrophy and  chronic microvascular ischemic changes. Vascular: Calcified atherosclerosis at the skullbase. No hyperdense vessel. Skull: Normal. Negative for fracture or focal lesion. Sinuses/Orbits: No acute finding. Chronic small amount of fluid in the right sphenoid sinus. Other: None. CT CERVICAL SPINE FINDINGS Alignment: No traumatic malalignment. Unchanged mild anterolisthesis at C7-T1. Skull base and vertebrae: No acute fracture. No primary bone lesion or focal pathologic process. Soft tissues and spinal canal: No prevertebral fluid or swelling. No visible canal hematoma. Disc levels: Fused C3-C4 and C4-C5. Unchanged severe C5-C6 and C6-C7 disc height loss. Upper chest: Unchanged biapical pleuroparenchymal scarring. Other: None. IMPRESSION: 1. No acute intracranial abnormality. 2. No acute cervical spine fracture or traumatic malalignment. Electronically Signed   By: Titus Dubin M.D.   On:  08/12/2020 13:02   CT CERVICAL SPINE WO CONTRAST  Result Date: 08/12/2020 CLINICAL DATA:  [place] yesterday. EXAM: CT HEAD WITHOUT CONTRAST CT CERVICAL SPINE WITHOUT CONTRAST TECHNIQUE: Multidetector CT imaging of the head and cervical spine was performed following the standard protocol without intravenous contrast. Multiplanar CT image reconstructions of the cervical spine were also generated. COMPARISON:  CT head dated July 31, 2020. CT cervical spine dated Jun 29, 2020. FINDINGS: CT HEAD FINDINGS Brain: No evidence of acute infarction, hemorrhage, hydrocephalus, extra-axial collection or mass lesion/mass effect. Stable atrophy and chronic microvascular ischemic changes. Vascular: Calcified atherosclerosis at the skullbase. No hyperdense vessel. Skull: Normal. Negative for fracture or focal lesion. Sinuses/Orbits: No acute finding. Chronic small amount of fluid in the right sphenoid sinus. Other: None. CT CERVICAL SPINE FINDINGS Alignment: No traumatic malalignment. Unchanged mild anterolisthesis at C7-T1. Skull base and  vertebrae: No acute fracture. No primary bone lesion or focal pathologic process. Soft tissues and spinal canal: No prevertebral fluid or swelling. No visible canal hematoma. Disc levels: Fused C3-C4 and C4-C5. Unchanged severe C5-C6 and C6-C7 disc height loss. Upper chest: Unchanged biapical pleuroparenchymal scarring. Other: None. IMPRESSION: 1. No acute intracranial abnormality. 2. No acute cervical spine fracture or traumatic malalignment. Electronically Signed   By: Titus Dubin M.D.   On: 08/12/2020 13:02   CT Hip Right Wo Contrast  Result Date: 08/12/2020 CLINICAL DATA:  Right hip pain after a fall yesterday. Initial encounter. EXAM: CT OF THE RIGHT HIP WITHOUT CONTRAST TECHNIQUE: Multidetector CT imaging of the right hip was performed according to the standard protocol. Multiplanar CT image reconstructions were also generated. COMPARISON:  Plain films right hip today and 05/24/2020. Plain films left hip and pelvis 09/06/2019. CT abdomen and pelvis 05/02/2020. FINDINGS: Bones/Joint/Cartilage Streak artifact from the patient's hip arthroplasty limits evaluation. There is buckling of the anterior cortex of the proximal diaphysis of the femur as seen on images 2428 of series 9 worrisome for fracture. No other finding to suggest acute fracture is identified. Bones are osteopenic. Remote left pubic rami fractures are noted were present on the 2021 study. Ligaments Suboptimally assessed by CT. Muscles and Tendons Appear normal. Soft tissues Imaged intrapelvic contents demonstrate no acute or focal abnormality. IMPRESSION: Limited examination due to streak artifact from the patient's hip arthroplasty. Buckling of the anterior cortex of the proximal diaphysis of the femur worrisome for nondisplaced fracture. No other evidence of acute abnormality. Remote left pubic rami fractures. Electronically Signed   By: Inge Rise M.D.   On: 08/12/2020 15:49   DG Hip Unilat With Pelvis 2-3 Views Right  Result  Date: 08/12/2020 CLINICAL DATA:  Trip and fall with right hip pain, initial encounter EXAM: DG HIP (WITH OR WITHOUT PELVIS) 2-3V RIGHT COMPARISON:  05/24/2020 FINDINGS: Right hip replacement is noted. The prosthesis appears well seated. Prior left pubic rami fractures are again noted. Subtle cortical irregularity is noted along the medial shaft of the femur adjacent to the prosthesis. Undisplaced fracture could not be totally excluded. IMPRESSION: Mild cortical irregularity along the medial aspect of the proximal femur adjacent to the prosthesis. This may represent an undisplaced fracture. CT may be helpful as clinically indicated. Healing left pubic rami fractures. Electronically Signed   By: Inez Catalina M.D.   On: 08/12/2020 12:25     Scheduled Meds:  amiodarone  200 mg Oral Daily   aspirin EC  81 mg Oral q morning   furosemide  20 mg Oral q morning   melatonin  5  mg Oral QHS   polyvinyl alcohol  1 drop Both Eyes QHS   sertraline  25 mg Oral QHS   Continuous Infusions:   LOS: 1 day   Kerney Elbe, DO [facility] PAGER is on AMION  If 7PM-7AM, please contact night-coverage [email or web address]

## 2020-08-13 NOTE — Evaluation (Signed)
Occupational Therapy Evaluation Patient Details Name: [name] MRN: [id number] DOB: 1916-03-08 Today's Date: 08/13/2020    History of Present Illness [name] is a 87 y.o. female with medical history significant of atrial fibrillation, CAD status post multiple PCI, diastolic heart failure, depression, anxiety, prior hip fracture, hypertension, hyperlipidemia, rectal cancer status post partial proctectomy, TIA who presents with hip pain following a fall at home.Patient can be weightbearing as tolerated.  No operative intervention.   Clinical Impression   Pt presents with decline in function and safety with ADLs and ADL mobility with impaired strength, balance and endurance; pt wit hx of falls. PTA pt lived at home alone with assist 1-2 x/wk for showers, was Ind with ADLs/selfcare, mobility using RW and cooking. Pt has assist from friends and family for housekeeping, laundry and transportation. Pt currently requires max - mod A for bed mobility, total - max A for LB ADLs, min A for UB ADLs, total A for toileting and total - max A for sit - stand with RW. Pt would benefit from acute OT services to address impairments to maximize level of function and safety    Follow Up Recommendations  SNF;Supervision/Assistance - 24 hour    Equipment Recommendations  Other (comment) (TBD at SNF)    Recommendations for Other Services       Precautions / Restrictions Precautions Precautions: Fall Restrictions Weight Bearing Restrictions: Yes RLE Weight Bearing: Weight bearing as tolerated      Mobility Bed Mobility Overal bed mobility: Needs Assistance Bed Mobility: Supine to Sit;Sit to Supine     Supine to sit: Mod assist;Max assist Sit to supine: Max assist   General bed mobility comments: mod A with LEs to EOB, max A to elevate trunk, max A lateral scoot and back to supine    Transfers Overall transfer level: Needs assistance Equipment used: Rolling walker (2  wheeled) Transfers: Sit to/from Stand Sit to Stand: Total assist;Max assist         General transfer comment: total A first sit - stand, max A for second, total A stand - sit    Balance Overall balance assessment: Needs assistance Sitting-balance support: Bilateral upper extremity supported;Feet supported Sitting balance-Leahy Scale: Fair     Standing balance support: Bilateral upper extremity supported;During functional activity Standing balance-Leahy Scale: Poor                             ADL either performed or assessed with clinical judgement   ADL Overall ADL's : Needs assistance/impaired Eating/Feeding: Set up;Sitting   Grooming: Wash/dry hands;Wash/dry face;Minimal assistance;Sitting   Upper Body Bathing: Minimal assistance;Sitting   Lower Body Bathing: Maximal assistance   Upper Body Dressing : Minimal assistance;Sitting   Lower Body Dressing: Total assistance   Toilet Transfer: Maximal assistance;RW;Stand-pivot   Toileting- Clothing Manipulation and Hygiene: Total assistance;Bed level       Functional mobility during ADLs: Total assistance;Maximal assistance;Cueing for safety       Vision Baseline Vision/History: Wears glasses Wears Glasses: At all times Patient Visual Report: No change from baseline       Perception     Praxis      Pertinent Vitals/Pain Pain Assessment: Faces Faces Pain Scale: Hurts whole lot Pain Location: R LE/hip Pain Descriptors / Indicators: Guarding;Grimacing;Moaning Pain Intervention(s): Limited activity within patient's tolerance;Monitored during session;Premedicated before session;Repositioned     Hand Dominance Right   Extremity/Trunk Assessment Upper Extremity Assessment Upper Extremity Assessment:  Generalized weakness   Lower Extremity Assessment Lower Extremity Assessment: Defer to PT evaluation   Cervical / Trunk Assessment Cervical / Trunk Assessment: Kyphotic   Communication  Communication Communication: HOH;Other (comment) (has hearing aid)   Cognition Arousal/Alertness: Awake/alert Behavior During Therapy: WFL for tasks assessed/performed Overall Cognitive Status: Within Functional Limits for tasks assessed                                     General Comments       Exercises     Shoulder Instructions      Home Living Family/patient expects to be discharged to:: Skilled nursing facility Living Arrangements: Alone                               Additional Comments: showers 1-2x/wk with aide      Prior Functioning/Environment Level of Independence: Independent with assistive device(s)        Comments: Ind with ADLs/selfcare, friend helps with housekeeping, pt is able prep light meals. Uses RW for ambulation, Does not drive. Fall leading to admission; wears life alert button. Loves to bake/cook; enjoys meeting with church group. Neighbor gets her groceries        OT Problem List: Decreased strength;Impaired balance (sitting and/or standing);Pain;Decreased activity tolerance;Decreased coordination;Decreased knowledge of use of DME or AE      OT Treatment/Interventions: Self-care/ADL training;Therapeutic exercise;Patient/family education;Balance training;Therapeutic activities;DME and/or AE instruction    OT Goals(Current goals can be found in the care plan section) Acute Rehab OT Goals Patient Stated Goal: go to rehab and go to a home OT Goal Formulation: With patient Time For Goal Achievement: 08/27/20 Potential to Achieve Goals: Good ADL Goals Pt Will Perform Grooming: with min guard assist;with supervision;sitting Pt Will Perform Upper Body Bathing: with min guard assist;with supervision;sitting Pt Will Perform Lower Body Bathing: with mod assist;sitting/lateral leans Pt Will Perform Upper Body Dressing: with min guard assist;with supervision;sitting Pt Will Transfer to Toilet: with max assist;with mod  assist;stand pivot transfer;bedside commode Additional ADL Goal #1: Pt will complete bed mobility mod - min A to sit EOB in prep for ADL tasks  OT Frequency: Min 2X/week   Barriers to D/C:            Co-evaluation              AM-PAC OT "6 Clicks" Daily Activity     Outcome Measure Help from another person eating meals?: None Help from another person taking care of personal grooming?: A Little Help from another person toileting, which includes using toliet, bedpan, or urinal?: Total Help from another person bathing (including washing, rinsing, drying)?: A Lot Help from another person to put on and taking off regular upper body clothing?: A Little Help from another person to put on and taking off regular lower body clothing?: Total 6 Click Score: 14   End of Session Equipment Utilized During Treatment: Rolling walker;Gait belt Nurse Communication: Mobility status  Activity Tolerance: Patient limited by pain;Patient limited by fatigue Patient left: in bed;with call bell/phone within reach;with bed alarm set  OT Visit Diagnosis: Unsteadiness on feet (R26.81);Other abnormalities of gait and mobility (R26.89);History of falling (Z91.81);Muscle weakness (generalized) (M62.81);Pain Pain - Right/Left: Right Pain - part of body: Hip;Leg                Time: 1157-2620 OT Time Calculation (  min): 27 min Charges:  OT General Charges $OT Visit: 1 Visit OT Treatments $Self Care/Home Management : 8-22 mins    Emmit Alexanders [facility] 08/13/2020, 3:07 PM

## 2020-08-13 NOTE — Care Management CC44 (Signed)
Condition Code 44 Documentation Completed  Patient Details  Name: [name] MRN: [id number] Date of Birth: [birth year]   Condition Code 44 given:  Yes Patient signature on Condition Code 44 notice:  Yes Documentation of 2 MD's agreement:  Yes Code 44 added to claim:  Yes    Sharin Mons, RN 08/13/2020, 4:34 PM

## 2020-08-13 NOTE — TOC CAGE-AID Note (Signed)
Transition of Care [facility]) - CAGE-AID Screening   Patient Details  Name: [name] MRN: [id number] Date of Birth: 1917/02/01  [facility]) CM/SW Contact:    Clovis Cao, RN Phone Number: [phone] 08/13/2020, 1:04 PM   Clinical Narrative: Pt denies drug or alcohol use.   CAGE-AID Screening:    Have You Ever Felt You Ought to Cut Down on Your Drinking or Drug Use?: No Have People Annoyed You By Critizing Your Drinking Or Drug Use?: No Have You Felt Bad Or Guilty About Your Drinking Or Drug Use?: No Have You Ever Had a Drink or Used Drugs First Thing In The Morning to Steady Your Nerves or to Get Rid of a Hangover?: No CAGE-AID Score: 0  Substance Abuse Education Offered: No

## 2020-08-13 NOTE — Evaluation (Signed)
Physical Therapy Evaluation Patient Details Name: [name] MRN: [id number] DOB: May 14, 1916 Today's Date: 08/13/2020   History of Present Illness  [name] is a 87 y.o. female admitted on 08/12/20 for hip pain following a fall at home.Patient can be weightbearing as tolerated.  No operative intervention.  Pt with medical history significant for atrial fibrillation, CAD status post multiple PCI, diastolic heart failure, depression, anxiety, prior hip fracture, hypertension, rectal cancer status post partial proctectomy (has ostomy bag), and TIA.  Clinical Impression  Pt was able to come to sitting EOB and stand again with me (stood with OT earlier as well) with two person lighter mod assist and RW.  She had difficulty attempting to side step due to pain with WB on right leg.  Pt would benefit from post acute rehab.  She is interested in SNF [facility]. PT will continue to follow acutely for safe mobility progression.   PT to follow acutely for deficits listed below.       Follow Up Recommendations SNF (would like [facility] if she can)    Equipment Recommendations  None recommended by PT    Recommendations for Other Services       Precautions / Restrictions Precautions Precautions: Fall Precaution Comments: L LQ ostomy bag Restrictions Weight Bearing Restrictions: Yes RLE Weight Bearing: Weight bearing as tolerated      Mobility  Bed Mobility Overal bed mobility: Needs Assistance Bed Mobility: Supine to Sit     Supine to sit: Mod assist;HOB elevated Sit to supine: Mod assist;HOB elevated   General bed mobility comments: Mod assist to support trunk to come to sitting EOB, improved over session with OT, moving herself more with me.    Transfers Overall transfer level: Needs assistance Equipment used: Rolling walker (2 wheeled) Transfers: Sit to/from Stand Sit to Stand: Mod assist;+2 physical assistance         General transfer comment:  Pt able to stand with light mod assist up to RW, unable to successfully side step without buckling from pain over R leg.  Ambulation/Gait                Stairs            Wheelchair Mobility    Modified Rankin (Stroke Patients Only)       Balance Overall balance assessment: Needs assistance Sitting-balance support: Feet supported;Bilateral upper extremity supported Sitting balance-Leahy Scale: Poor Sitting balance - Comments: bil UE supported on bed for balance   Standing balance support: Bilateral upper extremity supported Standing balance-Leahy Scale: Poor Standing balance comment: bil UE supported on RW and two person assist to stand.                             Pertinent Vitals/Pain Pain Assessment: Faces Faces Pain Scale: Hurts whole lot Pain Location: R LE/hip with WB and movement Pain Descriptors / Indicators: Guarding;Grimacing;Moaning Pain Intervention(s): Limited activity within patient's tolerance;Monitored during session;Repositioned    Home Living Family/patient expects to be discharged to:: Skilled nursing facility Living Arrangements: Alone               Additional Comments: showers 1-2x/wk with aide    Prior Function Level of Independence: Independent with assistive device(s)         Comments: Ind with ADLs/selfcare, friend helps with housekeeping, pt is able prep light meals. Uses RW for ambulation, Does not drive. Fall leading to admission; wears  life alert button. Loves to bake/cook; enjoys meeting with church group. Neighbor gets her groceries     Hand Dominance   Dominant Hand: Right    Extremity/Trunk Assessment   Upper Extremity Assessment Upper Extremity Assessment: Defer to OT evaluation    Lower Extremity Assessment Lower Extremity Assessment: RLE deficits/detail RLE Deficits / Details: right leg limited by hip pain, 3-/5 ankle, 2/5 knee flex/ext, 2-/5 hip movement. RLE: Unable to fully assess due to  pain    Cervical / Trunk Assessment Cervical / Trunk Assessment: Kyphotic  Communication   Communication: HOH;Other (comment) (has hearing aid)  Cognition Arousal/Alertness: Awake/alert Behavior During Therapy: WFL for tasks assessed/performed Overall Cognitive Status: Within Functional Limits for tasks assessed                                        General Comments      Exercises Total Joint Exercises Ankle Circles/Pumps: AROM;Both;5 reps Heel Slides: AROM;Left;AAROM;Right;5 reps Hip ABduction/ADduction: AROM;Left;AAROM;Right;5 reps Long Arc Quad: AROM;Left;AAROM;Right;5 reps   Assessment/Plan    PT Assessment Patient needs continued PT services  PT Problem List Decreased strength;Decreased range of motion;Decreased activity tolerance;Decreased balance;Decreased mobility;Decreased knowledge of use of DME;Pain       PT Treatment Interventions DME instruction;Gait training;Functional mobility training;Therapeutic activities;Therapeutic exercise;Balance training;Patient/family education;Modalities    PT Goals (Current goals can be found in the Care Plan section)  Acute Rehab PT Goals Patient Stated Goal: go to rehab and go to a home PT Goal Formulation: With patient Time For Goal Achievement: 08/27/20 Potential to Achieve Goals: Good    Frequency Min 3X/week   Barriers to discharge        Co-evaluation               AM-PAC PT "6 Clicks" Mobility  Outcome Measure Help needed turning from your back to your side while in a flat bed without using bedrails?: A Lot Help needed moving from lying on your back to sitting on the side of a flat bed without using bedrails?: A Lot Help needed moving to and from a bed to a chair (including a wheelchair)?: Total Help needed standing up from a chair using your arms (e.g., wheelchair or bedside chair)?: Total Help needed to walk in hospital room?: Total Help needed climbing 3-5 steps with a railing? :  Total 6 Click Score: 8    End of Session Equipment Utilized During Treatment: Gait belt Activity Tolerance: Patient limited by pain Patient left: in bed;with call bell/phone within reach;with bed alarm set   PT Visit Diagnosis: Muscle weakness (generalized) (M62.81);Difficulty in walking, not elsewhere classified (R26.2);Pain Pain - Right/Left: Right Pain - part of body: Hip    Time: 2703-5009 PT Time Calculation (min) (ACUTE ONLY): 22 min   Charges:   PT Evaluation $PT Eval Moderate Complexity: 1 Mod         Verdene Lennert, PT, DPT  Acute Rehabilitation Ortho Tech Supervisor [phone] pager [phone]) [phone] office

## 2020-08-13 NOTE — TOC Progression Note (Signed)
Transition of Care [facility]) - Progression Note    Patient Details  Name: AKIYAH EPPOLITO MRN: [id number] Date of Birth: November 26, 1916  [facility] of [facility]) CM/SW Contact  Milinda Antis, [place] Phone Number: 08/13/2020, 8:44 AM  Clinical Narrative:    Right Hip fx  TOC following patient for any d/c planning needs once medically stable.  Patient from home alone.  Lind Covert, MSW, LCSWA         Expected Discharge Plan and Services                                                 Social Determinants of Health (SDOH) Interventions    Readmission Risk Interventions Readmission Risk Prevention Plan 05/04/2020  Transportation Screening Complete  PCP or Specialist Appt within 5-7 Days Complete  Home Care Screening Complete  Medication Review (RN CM) Complete  Some recent data might be hidden

## 2020-08-13 NOTE — Progress Notes (Signed)
Orthopedic trauma note  Please see attestation from yesterday's consult note.  Patient can be weightbearing as tolerated.  No operative intervention.  Follow-up as an outpatient.  Shona Needles, MD [facility] [phone] (office) [email or web address]

## 2020-08-13 NOTE — Care Management Obs Status (Signed)
[facility] NOTIFICATION   Patient Details  Name: [name] MRN: [id number] Date of Birth: [birth year]   Medicare Observation Status Notification Given:  Yes    Sharin Mons, RN 08/13/2020, 4:34 PM

## 2020-08-14 DIAGNOSIS — S7291XA Unspecified fracture of right femur, initial encounter for closed fracture: Secondary | ICD-10-CM | POA: Diagnosis not present

## 2020-08-14 DIAGNOSIS — I2510 Atherosclerotic heart disease of native coronary artery without angina pectoris: Secondary | ICD-10-CM | POA: Diagnosis not present

## 2020-08-14 DIAGNOSIS — I5032 Chronic diastolic (congestive) heart failure: Secondary | ICD-10-CM | POA: Diagnosis not present

## 2020-08-14 DIAGNOSIS — S72091A Other fracture of head and neck of right femur, initial encounter for closed fracture: Secondary | ICD-10-CM | POA: Diagnosis not present

## 2020-08-14 DIAGNOSIS — M25551 Pain in right hip: Secondary | ICD-10-CM | POA: Diagnosis not present

## 2020-08-14 DIAGNOSIS — E440 Moderate protein-calorie malnutrition: Secondary | ICD-10-CM | POA: Insufficient documentation

## 2020-08-14 DIAGNOSIS — I4892 Unspecified atrial flutter: Secondary | ICD-10-CM | POA: Diagnosis not present

## 2020-08-14 LAB — CBC WITH DIFFERENTIAL/PLATELET
Abs Immature Granulocytes: 0.02 10*3/uL (ref 0.00–0.07)
Basophils Absolute: 0 10*3/uL (ref 0.0–0.1)
Basophils Relative: 0 %
Eosinophils Absolute: 0.3 10*3/uL (ref 0.0–0.5)
Eosinophils Relative: 4 %
HCT: 33.5 % — ABNORMAL LOW (ref 36.0–46.0)
Hemoglobin: 11 g/dL — ABNORMAL LOW (ref 12.0–15.0)
Immature Granulocytes: 0 %
Lymphocytes Relative: 27 %
Lymphs Abs: 1.9 10*3/uL (ref 0.7–4.0)
MCH: 30.7 pg (ref 26.0–34.0)
MCHC: 32.8 g/dL (ref 30.0–36.0)
MCV: 93.6 fL (ref 80.0–100.0)
Monocytes Absolute: 0.7 10*3/uL (ref 0.1–1.0)
Monocytes Relative: 10 %
Neutro Abs: 4.1 10*3/uL (ref 1.7–7.7)
Neutrophils Relative %: 59 %
Platelets: 148 10*3/uL — ABNORMAL LOW (ref 150–400)
RBC: 3.58 MIL/uL — ABNORMAL LOW (ref 3.87–5.11)
RDW: 15.5 % (ref 11.5–15.5)
WBC: 7.1 10*3/uL (ref 4.0–10.5)
nRBC: 0 % (ref 0.0–0.2)

## 2020-08-14 LAB — COMPREHENSIVE METABOLIC PANEL
ALT: 13 U/L (ref 0–44)
AST: 18 U/L (ref 15–41)
Albumin: 2.7 g/dL — ABNORMAL LOW (ref 3.5–5.0)
Alkaline Phosphatase: 60 U/L (ref 38–126)
Anion gap: 6 (ref 5–15)
BUN: 15 mg/dL (ref 8–23)
CO2: 30 mmol/L (ref 22–32)
Calcium: 9.2 mg/dL (ref 8.9–10.3)
Chloride: 98 mmol/L (ref 98–111)
Creatinine, Ser: 0.83 mg/dL (ref 0.44–1.00)
GFR, Estimated: >60 mL/min (ref >60)
Glucose, Bld: 93 mg/dL (ref 70–99)
Potassium: 4.1 mmol/L (ref 3.5–5.1)
Sodium: 134 mmol/L — ABNORMAL LOW (ref 135–145)
Total Bilirubin: 0.7 mg/dL (ref 0.3–1.2)
Total Protein: 5.8 g/dL — ABNORMAL LOW (ref 6.5–8.1)

## 2020-08-14 LAB — PHOSPHORUS: Phosphorus: 3 mg/dL (ref 2.5–4.6)

## 2020-08-14 LAB — MAGNESIUM: Magnesium: 1.8 mg/dL (ref 1.7–2.4)

## 2020-08-14 NOTE — Progress Notes (Signed)
PROGRESS NOTE    [name]  ZOX:[id number] DOB: [birth year] DOA: 08/12/2020 PCP: Leonard Downing, MD   Brief Narrative: The patient is 87 year old elderly Caucasian female with a past medical history significant for but not limited to atrial fibrillation, CAD status post multiple PCI, history of chronic diastolic CHF, depression and anxiety, history of prior hip fracture, hypertension, hyperlipidemia, rectal cancer status post partial proctectomy, history of TIA as well as other comorbidities who presents with a chief complaint of hip pain following a fall at home.  The patient states that she fell after she tripped over a comforter that she was taken off the bed.  She is unable to stand immediately but she was able to call for help and EMS came to evaluate and brought her to the ED.  She denied hitting her head but did endorse some head pain.  She states that she is ambulatory at baseline with assistance of a walker.  Patient's niece stated that she was somewhat weaker than her baseline for the last week or so after having a UTI and had decreased p.o. intake while she was on antibiotics.  She underwent further work-up and chest x-ray showed mild right atelectasis and a hip x-ray showed a questionable nondisplaced periprosthetic fracture.  CT of the right hip was done and was worrisome for findings consistent with a periprosthetic nondisplaced fracture however orthopedic surgery evaluated and recommended nonoperative management and recommended weightbearing as tolerated.  She had a head CT and CT spine that were done without any acute abnormalities.  PT OT evaluated and now recommending SNF and awaiting bed placement.  Assessment & Plan:   Principal Problem:   Closed fracture of right femur ([place]) Active Problems:   Rectal cancer, pT2uN0(pNX) s/p TEM partial proctectomy   CAD (coronary artery disease)   HTN (hypertension)   PAF (paroxysmal atrial fibrillation) (HCC)   Atrial flutter  (HCC)   Chronic diastolic CHF (congestive heart failure) (HCC)   Hip pain   Malnutrition of moderate degree  Closed fracture of right femur -Patient presented after a fall when she tripped over comforter.  Unable to walk after fall.  Ambulatory prior to this with a walker.  -X-ray and CT showed of findings concerning for nondisplaced periprosthetic fracture of the right femur. - Ortho has been consulted, appreciate recommendations and they are recommending Non-operative Management and WBAT -Pain control with Norco  5-325 mg 1 tab po q6hprn for moderate pain and Dilaudid 0.4 mg sL for breakthrough pain -Hip/femur fracture protocol for positioning and care -PT/OT recommending SNF and awaiting placement    History of rectal cancer -Status post partial proctectomy about 10 years ago with colostomy in place. -WOC consult   Atrial Flutter -Continue home Amiodarone 200 mg po Daily -Continue to Monitor on Telemetry   CAD -Status post multiple PCI including 2 bare-metal stents -Continue home aspirin 81 mg po Daily and continue NTG 0.4 SL q39min PRN Chest Pain   Hypertension -Continue to Monitor BP per Protocol   Chronic Diastolic CHF -Echo in March with EF 60-65%, mild hypertrophy, dilated right atria -Continue daily Lasix 20 mg po qHS -Strict I's and O's and Daily Weights   Hyponatremia -Improved initially. Na+ went from 131 -> 136 -> 134 -Continue to Monitor and Trend -Repeat CMP in the AM  Thrombocytopenia -Mild. Platelet Count went from 176 -> 147 -> 148 -Continue to Monitor for S/Sx of Bleeding -Repeat CBC in the AM  Normocytic Anemia  -Patient's Hgb/Hct went from  12.7/38.9 -> 11.3/35.4 -> 11.0/33.5 -Check Anemia Panel in the AM -Continue to Monitor for S/Sx of Bleeding; No overt bleeding noted -Repeat CBC in the AM    Depression and Anxiety -Continue home Sertraline 25 mg po qHS, melatonin   Underweight and Non-Severe Moderate Malnutrition in the Context of Chronic  illness -Nutritionist consulted for further evaluation and Recc's  -They are recommending Ensure Enlive po BID  -C/w MVI Minerals Daily   DVT prophylaxis: SCDs Code Status: DO NOT RESUSCITATE  Family Communication: No family present at bedside  Disposition Plan: SNF  Status is: Observation  The patient remains OBS appropriate and will d/c before 2 midnights.  Dispo: The patient is from: Home              Anticipated d/c is to: SNF              Patient currently is not medically stable to d/c.   Difficult to place patient No  Consultants:  Orthopedic Surgery  Procedures: CT Scan Hip  Antimicrobials:  Anti-infectives (From admission, onward)    None        Subjective: Seen and examined at bedside and was frustrated that she did not get her breakfast.  States that she has some pain when she moves but at rest she has minimal pain.  Denies any lightheadedness or dizziness.  No other concerns or complaints this time.  Objective: Vitals:   08/13/20 1051 08/13/20 1517 08/13/20 2123 08/14/20 0825  BP: (!) 121/44 (!) 122/58 (!) 116/46 136/61  Pulse: (!) 59 74 (!) 57 68  Resp:  16 16 18   Temp:  97.6 F (36.4 C) 98.3 F (36.8 C) 98.1 F (36.7 C)  TempSrc:  Oral Oral Oral  SpO2: 93% 90% 93% 94%  Weight:      Height:       No intake or output data in the 24 hours ending 08/14/20 1432  Filed Weights   08/12/20 1128  Weight: 42.6 kg   Examination: Physical Exam:  Constitutional: Patient is a thin elderly Caucasian female currently in no acute distress appears a little frustrated that she did not get her breakfast and mildly uncomfortable Eyes: Lids and conjunctivae normal, sclerae anicteric  ENMT: External Ears, Nose appear normal. Grossly normal hearing.  Neck: Appears normal, supple, no cervical masses, normal ROM, no appreciable thyromegaly; no JVD Respiratory: Diminished to auscultation bilaterally, no wheezing, rales, rhonchi or crackles. Normal respiratory effort  and patient is not tachypenic. No accessory muscle use.  Unlabored breathing Cardiovascular: RRR, no murmurs / rubs / gallops. S1 and S2 auscultated. No extremity edema.  Abdomen: Soft, non-tender, non-distended.  Has an ostomy bag in place and bowel sounds are present.  GU: Deferred. Musculoskeletal: No clubbing / cyanosis of digits/nails. No joint deformity upper and lower extremities.  Skin: No rashes, lesions, ulcers. No induration; Warm and dry.  Neurologic: CN 2-12 grossly intact with no focal deficits. Romberg sign  andcerebellar reflexes not assessed.  Psychiatric: Normal judgment and insight. Alert and oriented x 3. Normal mood and appropriate affect.    Data Reviewed: I have personally reviewed following labs and imaging studies  CBC: Recent Labs  Lab 08/12/20 1600 08/13/20 1035 08/14/20 0141  WBC 8.3 7.1 7.1  NEUTROABS 5.5 4.6 4.1  HGB 12.7 11.3* 11.0*  HCT 38.9 35.4* 33.5*  MCV 95.1 96.5 93.6  PLT 176 147* 148*    Basic Metabolic Panel: Recent Labs  Lab 08/12/20 1600 08/13/20 1035 08/14/20 0141  NA  131* 136 134*  K 4.1 4.2 4.1  CL 95* 102 98  CO2 25 27 30   GLUCOSE 95 119* 93  BUN 16 15 15   CREATININE 0.84 0.89 0.83  CALCIUM 9.2 9.3 9.2  MG  --  1.8 1.8  PHOS  --  2.9 3.0    GFR: Estimated Creatinine Clearance: 23 mL/min (by C-G formula based on SCr of 0.83 mg/dL). Liver Function Tests: Recent Labs  Lab 08/13/20 1035 08/14/20 0141  AST 23 18  ALT 14 13  ALKPHOS 65 60  BILITOT 0.7 0.7  PROT 6.2* 5.8*  ALBUMIN 2.9* 2.7*    No results for input(s): LIPASE, AMYLASE in the last 168 hours. No results for input(s): AMMONIA in the last 168 hours. Coagulation Profile: Recent Labs  Lab 08/12/20 1600  INR 1.0    Cardiac Enzymes: No results for input(s): CKTOTAL, CKMB, CKMBINDEX, TROPONINI in the last 168 hours. BNP (last 3 results) No results for input(s): PROBNP in the last 8760 hours. HbA1C: No results for input(s): HGBA1C in the last 72  hours. CBG: No results for input(s): GLUCAP in the last 168 hours. Lipid Profile: No results for input(s): CHOL, HDL, LDLCALC, TRIG, CHOLHDL, LDLDIRECT in the last 72 hours. Thyroid Function Tests: No results for input(s): TSH, T4TOTAL, FREET4, T3FREE, THYROIDAB in the last 72 hours. Anemia Panel: No results for input(s): VITAMINB12, FOLATE, FERRITIN, TIBC, IRON, RETICCTPCT in the last 72 hours. Sepsis Labs: No results for input(s): PROCALCITON, LATICACIDVEN in the last 168 hours.  Recent Results (from the past 240 hour(s))  SARS CORONAVIRUS 2 (TAT 6-24 HRS) Nasopharyngeal Nasopharyngeal Swab     Status: None   Collection Time: 08/13/20 12:59 PM   Specimen: Nasopharyngeal Swab  Result Value Ref Range Status   SARS Coronavirus 2 NEGATIVE NEGATIVE Final    Comment: (NOTE) SARS-CoV-2 target nucleic acids are NOT DETECTED.  The SARS-CoV-2 RNA is generally detectable in upper and lower respiratory specimens during the acute phase of infection. Negative results do not preclude SARS-CoV-2 infection, do not rule out co-infections with other pathogens, and should not be used as the sole basis for treatment or other patient management decisions. Negative results must be combined with clinical observations, patient history, and epidemiological information. The expected result is Negative.  Fact Sheet for Patients: SugarRoll.be  Fact Sheet for Healthcare Providers: https://www.woods-mathews.com/  This test is not yet approved or cleared by the [place] FDA and  has been authorized for detection and/or diagnosis of SARS-CoV-2 by FDA under an Emergency Use Authorization (EUA). This EUA will remain  in effect (meaning this test can be used) for the duration of the COVID-19 declaration under Se ction 564(b)(1) of the Act, 21 U.S.C. section 360bbb-3(b)(1), unless the authorization is terminated or revoked sooner.  Performed at [facility], [place] [address], [place], [place] [address]      RN Pressure Injury Documentation:     Estimated body mass index is 16.65 kg/m as calculated from the following:   Height as of this encounter: 5\' 3"  (1.6 m).   Weight as of this encounter: 42.6 kg.  Malnutrition Type: Nutrition Problem: Moderate Malnutrition Etiology: chronic illness (CHF) Malnutrition Characteristics: Signs/Symptoms: mild fat depletion, moderate fat depletion, mild muscle depletion, moderate muscle depletion Nutrition Interventions: Interventions: MVI, Ensure Enlive (each supplement provides 350kcal and 20 grams of protein), Liberalize Diet   Radiology Studies: CT Hip Right Wo Contrast  Result Date: 08/12/2020 CLINICAL DATA:  Right hip pain after a fall yesterday.  Initial encounter. EXAM: CT OF THE RIGHT HIP WITHOUT CONTRAST TECHNIQUE: Multidetector CT imaging of the right hip was performed according to the standard protocol. Multiplanar CT image reconstructions were also generated. COMPARISON:  Plain films right hip today and 05/24/2020. Plain films left hip and pelvis 09/06/2019. CT abdomen and pelvis 05/02/2020. FINDINGS: Bones/Joint/Cartilage Streak artifact from the patient's hip arthroplasty limits evaluation. There is buckling of the anterior cortex of the proximal diaphysis of the femur as seen on images 2428 of series 9 worrisome for fracture. No other finding to suggest acute fracture is identified. Bones are osteopenic. Remote left pubic rami fractures are noted were present on the 2021 study. Ligaments Suboptimally assessed by CT. Muscles and Tendons Appear normal. Soft tissues Imaged intrapelvic contents demonstrate no acute or focal abnormality. IMPRESSION: Limited examination due to streak artifact from the patient's hip arthroplasty. Buckling of the anterior cortex of the proximal diaphysis of the femur worrisome for nondisplaced fracture. No other evidence of acute abnormality. Remote left pubic rami  fractures. Electronically Signed   By: Inge Rise M.D.   On: 08/12/2020 15:49     Scheduled Meds:  amiodarone  200 mg Oral Daily   aspirin EC  81 mg Oral q morning   feeding supplement  237 mL Oral BID BM   furosemide  20 mg Oral q morning   melatonin  5 mg Oral QHS   multivitamin with minerals  1 tablet Oral Daily   polyvinyl alcohol  1 drop Both Eyes QHS   sertraline  25 mg Oral QHS   Continuous Infusions:   LOS: 1 day   Kerney Elbe, DO [facility] PAGER is on [place]  If 7PM-7AM, please contact night-coverage [email or web address]

## 2020-08-14 NOTE — Plan of Care (Signed)
  Problem: Health Behavior/Discharge Planning: Goal: Ability to manage health-related needs will improve Outcome: Progressing   Problem: Activity: Goal: Risk for activity intolerance will decrease Outcome: Progressing   

## 2020-08-14 NOTE — Plan of Care (Signed)

## 2020-08-15 DIAGNOSIS — I5032 Chronic diastolic (congestive) heart failure: Secondary | ICD-10-CM | POA: Diagnosis not present

## 2020-08-15 DIAGNOSIS — M25559 Pain in unspecified hip: Secondary | ICD-10-CM | POA: Diagnosis not present

## 2020-08-15 DIAGNOSIS — M25551 Pain in right hip: Secondary | ICD-10-CM | POA: Diagnosis not present

## 2020-08-15 DIAGNOSIS — I4892 Unspecified atrial flutter: Secondary | ICD-10-CM | POA: Diagnosis not present

## 2020-08-15 DIAGNOSIS — S7291XA Unspecified fracture of right femur, initial encounter for closed fracture: Secondary | ICD-10-CM | POA: Diagnosis not present

## 2020-08-15 DIAGNOSIS — I2510 Atherosclerotic heart disease of native coronary artery without angina pectoris: Secondary | ICD-10-CM | POA: Diagnosis not present

## 2020-08-15 LAB — CBC WITH DIFFERENTIAL/PLATELET
Abs Immature Granulocytes: 0.04 10*3/uL (ref 0.00–0.07)
Basophils Absolute: 0 10*3/uL (ref 0.0–0.1)
Basophils Relative: 0 %
Eosinophils Absolute: 0.3 10*3/uL (ref 0.0–0.5)
Eosinophils Relative: 3 %
HCT: 36.6 % (ref 36.0–46.0)
Hemoglobin: 12.2 g/dL (ref 12.0–15.0)
Immature Granulocytes: 1 %
Lymphocytes Relative: 27 %
Lymphs Abs: 2.3 10*3/uL (ref 0.7–4.0)
MCH: 30.8 pg (ref 26.0–34.0)
MCHC: 33.3 g/dL (ref 30.0–36.0)
MCV: 92.4 fL (ref 80.0–100.0)
Monocytes Absolute: 0.6 10*3/uL (ref 0.1–1.0)
Monocytes Relative: 7 %
Neutro Abs: 5.5 10*3/uL (ref 1.7–7.7)
Neutrophils Relative %: 62 %
Platelets: 179 10*3/uL (ref 150–400)
RBC: 3.96 MIL/uL (ref 3.87–5.11)
RDW: 15.2 % (ref 11.5–15.5)
WBC: 8.7 10*3/uL (ref 4.0–10.5)
nRBC: 0 % (ref 0.0–0.2)

## 2020-08-15 LAB — COMPREHENSIVE METABOLIC PANEL
ALT: 14 U/L (ref 0–44)
AST: 18 U/L (ref 15–41)
Albumin: 2.7 g/dL — ABNORMAL LOW (ref 3.5–5.0)
Alkaline Phosphatase: 65 U/L (ref 38–126)
Anion gap: 7 (ref 5–15)
BUN: 21 mg/dL (ref 8–23)
CO2: 30 mmol/L (ref 22–32)
Calcium: 9.2 mg/dL (ref 8.9–10.3)
Chloride: 100 mmol/L (ref 98–111)
Creatinine, Ser: 0.91 mg/dL (ref 0.44–1.00)
GFR, Estimated: 56 mL/min — ABNORMAL LOW (ref >60)
Glucose, Bld: 105 mg/dL — ABNORMAL HIGH (ref 70–99)
Potassium: 4.3 mmol/L (ref 3.5–5.1)
Sodium: 137 mmol/L (ref 135–145)
Total Bilirubin: 0.5 mg/dL (ref 0.3–1.2)
Total Protein: 6 g/dL — ABNORMAL LOW (ref 6.5–8.1)

## 2020-08-15 LAB — FOLATE: Folate: 18.5 ng/mL (ref >5.9)

## 2020-08-15 LAB — IRON AND TIBC
Iron: 21 ug/dL — ABNORMAL LOW (ref 28–170)
Saturation Ratios: 8 % — ABNORMAL LOW (ref 10.4–31.8)
TIBC: 269 ug/dL (ref 250–450)
UIBC: 248 ug/dL

## 2020-08-15 LAB — FERRITIN: Ferritin: 79 ng/mL (ref 11–307)

## 2020-08-15 LAB — RETICULOCYTES
Immature Retic Fract: 8.9 % (ref 2.3–15.9)
RBC.: 3.95 MIL/uL (ref 3.87–5.11)
Retic Count, Absolute: 45.4 10*3/uL (ref 19.0–186.0)
Retic Ct Pct: 1.2 % (ref 0.4–3.1)

## 2020-08-15 LAB — MAGNESIUM: Magnesium: 1.8 mg/dL (ref 1.7–2.4)

## 2020-08-15 LAB — VITAMIN B12: Vitamin B-12: 421 pg/mL (ref 180–914)

## 2020-08-15 LAB — PHOSPHORUS: Phosphorus: 3.7 mg/dL (ref 2.5–4.6)

## 2020-08-15 MED ORDER — SODIUM CHLORIDE 0.9 % IV SOLN
12.5000 mg | Freq: Once | INTRAVENOUS | Status: DC
  Filled 2020-08-15: qty 0.5

## 2020-08-15 MED ORDER — SENNOSIDES-DOCUSATE SODIUM 8.6-50 MG PO TABS
1.0000 | ORAL_TABLET | Freq: Two times a day (BID) | ORAL | Status: DC
  Administered 2020-08-15 – 2020-08-18 (×7): 1 via ORAL
  Filled 2020-08-15 (×7): qty 1

## 2020-08-15 MED ORDER — POLYETHYLENE GLYCOL 3350 17 G PO PACK
17.0000 g | PACK | Freq: Two times a day (BID) | ORAL | Status: DC
  Administered 2020-08-15 – 2020-08-18 (×6): 17 g via ORAL
  Filled 2020-08-15 (×6): qty 1

## 2020-08-15 NOTE — Plan of Care (Signed)

## 2020-08-15 NOTE — Plan of Care (Signed)
  Problem: Activity: Goal: Risk for activity intolerance will decrease Outcome: Progressing   Problem: Coping: Goal: Level of anxiety will decrease Outcome: Progressing   Problem: Pain Managment: Goal: General experience of comfort will improve Outcome: Progressing   Problem: Safety: Goal: Ability to remain free from injury will improve Outcome: Progressing   Problem: Skin Integrity: Goal: Risk for impaired skin integrity will decrease Outcome: Progressing   

## 2020-08-15 NOTE — Plan of Care (Signed)
  Problem: Health Behavior/Discharge Planning: Goal: Ability to manage health-related needs will improve Outcome: Progressing   Problem: Activity: Goal: Risk for activity intolerance will decrease Outcome: Progressing   Problem: Safety: Goal: Ability to remain free from injury will improve Outcome: Progressing   

## 2020-08-15 NOTE — NC FL2 (Deleted)
[place] MEDICAID FL2 LEVEL OF CARE SCREENING TOOL     IDENTIFICATION  Patient Name: [name] Birthdate: February 16, 1916 Sex: female Admission Date (Current Location): 08/12/2020  [facility] and [place] Number:  Herbalist and Address:  [facility], [place] [address], [place], [place] [address]      Provider Number: [id number]  Attending Physician Name and Address:  Kerney Elbe, DO  Relative Name and Phone Number:  Essie Christine Niece     [phone]    Current Level of Care: Hospital Recommended Level of Care: [place] Prior Approval Number:    Date Approved/Denied:   PASRR Number:    Discharge Plan: SNF    Current Diagnoses: Patient Active Problem List   Diagnosis Date Noted   Malnutrition of moderate degree 08/14/2020   Hip pain 08/13/2020   Closed fracture of right femur ([place]) 08/12/2020   Syncope 06/29/2020   Atrial fibrillation with RVR ([place]) 05/03/2020   Other cirrhosis of liver ([place]) 05/01/2020   Paroxysmal atrial fibrillation with rapid ventricular response ([place]) 04/30/2020   Chronic diastolic CHF (congestive heart failure) ([place]) 04/30/2020   Elevated LFTs 04/30/2020   AKI (acute kidney injury) ([place]) 04/30/2020   Atrial flutter (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 01/11/2011   Anorexia post-op 12/08/2010   HTN (hypertension) 11/03/2010   Dizziness  09/15/2010   CAD (coronary artery disease)    Hyperlipidemia    Rectal cancer, pT2uN0(pNX) s/p TEM partial proctectomy 09/07/2010    Orientation RESPIRATION BLADDER Height & Weight        Normal Incontinent, External catheter Weight: 94 lb (42.6 kg) Height:  5\' 3"  (160 cm)  BEHAVIORAL SYMPTOMS/MOOD NEUROLOGICAL BOWEL NUTRITION STATUS      Colostomy Diet (See DC Summary)  AMBULATORY STATUS COMMUNICATION OF NEEDS Skin     Verbally Other (Comment) (Ecchymosis R Leg)                       Personal Care Assistance Level of Assistance  Bathing, Feeding, Dressing           Functional Limitations Info  Sight, Hearing, Speech Sight Info: Impaired Hearing Info: Impaired Speech Info: Adequate    SPECIAL CARE FACTORS FREQUENCY  PT (By licensed PT), OT (By licensed OT)     PT Frequency: 5x a week OT Frequency: 5x a week            Contractures Contractures Info: Not present    Additional Factors Info  Code Status, Allergies Code Status Info: DNR Allergies Info: Effexor (Venlafaxine Hydrochloride)   Penicillins   Sulfa Antibiotics   Dicyclomine   Lactose Intolerance (Gi)   Hydrocodone           Current Medications (08/15/2020):  This is the current hospital active medication list Current Facility-Administered Medications  Medication Dose Route Frequency Provider Last Rate Last Admin   amiodarone (PACERONE) tablet 200 mg  200 mg  Oral Daily Marcelyn Bruins, MD   200 mg at 08/15/20 0630   aspirin EC tablet 81 mg  81 mg Oral q morning Marcelyn Bruins, MD   81 mg at 08/15/20 1601   feeding supplement (ENSURE ENLIVE / ENSURE PLUS) liquid 237 mL  237 mL Oral BID BM Raiford Noble [place], DO   237 mL at 08/15/20 1000   furosemide (LASIX) tablet 20 mg  20 mg Oral q morning Marcelyn Bruins, MD   20 mg at 08/15/20 0932   HYDROcodone-acetaminophen (NORCO/VICODIN) 5-325 MG per tablet 1 tablet  1 tablet Oral Q6H PRN Marcelyn Bruins, MD   1 tablet at 08/14/20 1008    HYDROmorphone (DILAUDID) injection 0.5 mg  0.5 mg Intravenous Q4H PRN Marcelyn Bruins, MD       melatonin tablet 5 mg  5 mg Oral QHS Marcelyn Bruins, MD   5 mg at 08/14/20 2132   multivitamin with minerals tablet 1 tablet  1 tablet Oral Daily Raiford Noble [place], DO   1 tablet at 08/15/20 3557   nitroGLYCERIN (NITROSTAT) SL tablet 0.4 mg  0.4 mg Sublingual Q5 min PRN Marcelyn Bruins, MD       ondansetron [facility]) injection 4 mg  4 mg Intravenous Q6H PRN Sheikh, Omair Latif, DO       polyethylene glycol (MIRALAX / GLYCOLAX) packet 17 g  17 g Oral BID Sheikh, Omair Latif, DO       polyvinyl alcohol (LIQUIFILM TEARS) 1.4 % ophthalmic solution 1 drop  1 drop Both Eyes QHS Heloise Purpura, RPH   1 drop at 08/14/20 2132   promethazine (PHENERGAN) 12.5 mg in sodium chloride 0.9 % 50 mL IVPB  12.5 mg Intravenous Once Lovey Newcomer T, NP       senna-docusate (Senokot-S) tablet 1 tablet  1 tablet Oral BID Sheikh, Omair Latif, DO       sertraline (ZOLOFT) tablet 25 mg  25 mg Oral QHS Marcelyn Bruins, MD   25 mg at 08/14/20 2132     Discharge Medications: Please see discharge summary for a list of discharge medications.  Relevant Imaging Results:  Relevant Lab Results:   Additional Information [id number]  Reece Agar, LCSWA

## 2020-08-15 NOTE — NC FL2 (Addendum)
[place] MEDICAID FL2 LEVEL OF CARE SCREENING TOOL     IDENTIFICATION  Patient Name: [name] Birthdate: May 31, 1916 Sex: female Admission Date (Current Location): 08/12/2020  [facility] and [place] Number:  Herbalist and Address:  [facility], [place] [address] [place], [place] [address]      Provider Number: [id number]  Attending Physician Name and Address:  Kerney Elbe, DO  Relative Name and Phone Number:  Essie Christine  Niece                                      [phone]    Current Level of Care: Hospital Recommended Level of Care: [place] Prior Approval Number:    Date Approved/Denied:   PASRR Number:  [id number]  Discharge Plan: SNF    Current Diagnoses: Patient Active Problem List   Diagnosis Date Noted   Malnutrition of moderate degree 08/14/2020   Hip pain 08/13/2020   Closed fracture of right femur ([place]) 08/12/2020   Syncope 06/29/2020   Atrial fibrillation with RVR ([place]) 05/03/2020   Other cirrhosis of liver ([place]) 05/01/2020   Paroxysmal atrial fibrillation with rapid ventricular response ([place]) 04/30/2020   Chronic diastolic CHF (congestive heart failure) ([place]) 04/30/2020   Elevated LFTs 04/30/2020   AKI (acute kidney injury) ([place]) 04/30/2020   Atrial flutter ([place]) 04/27/2020   Secondary hypercoagulable state ([place]) 04/27/2020   Closed fracture of multiple pubic rami, left, initial encounter ([place]) 09/06/2019   Slurred speech 02/23/2017   PAF (paroxysmal atrial fibrillation) ([place]) 02/23/2017   Depression with anxiety 02/23/2017   TIA (transient ischemic attack)    Fracture, tibia and fibula 07/06/2015   Tibia/fibula fracture 07/03/2015   Lung nodule seen on imaging study 09/05/2013   Exposure to TB 09/05/2013   SOB (shortness of breath) 10/02/2011   Vascular skin changes 07/17/2011   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 01/11/2011   Anorexia post-op 12/08/2010   HTN  (hypertension) 11/03/2010   Dizziness 09/15/2010   CAD (coronary artery disease)    Hyperlipidemia    Rectal cancer, pT2uN0(pNX) s/p TEM partial proctectomy 09/07/2010    Orientation RESPIRATION BLADDER Height & Weight        Normal Incontinent, External catheter Weight: 94 lb (42.6 kg) Height:  5\' 3"  (160 cm)  BEHAVIORAL SYMPTOMS/MOOD NEUROLOGICAL BOWEL NUTRITION STATUS      Colostomy Diet (See DC Summary)  AMBULATORY STATUS COMMUNICATION OF NEEDS Skin     Verbally Other (Comment) (Ecchymosis R Leg)                       Personal Care Assistance Level of Assistance  Bathing, Feeding, Dressing           Functional Limitations Info  Sight, Hearing, Speech Sight Info: Impaired Hearing Info: Impaired Speech Info: Adequate    SPECIAL CARE FACTORS FREQUENCY  PT (By licensed PT), OT (By licensed OT)     PT Frequency: 5x a week OT Frequency: 5x a week            Contractures Contractures Info: Not present    Additional Factors Info  Code Status, Allergies Code Status Info: DNR Allergies Info: Effexor (Venlafaxine Hydrochloride)   Penicillins   Sulfa Antibiotics   Dicyclomine   Lactose Intolerance (Gi)   Hydrocodone           Current  Medications (08/15/2020):  This is the current hospital active medication list Current Facility-Administered Medications  Medication Dose Route Frequency Provider Last Rate Last Admin   amiodarone (PACERONE) tablet 200 mg  200 mg Oral Daily Marcelyn Bruins, MD   200 mg at 08/15/20 1308   aspirin EC tablet 81 mg  81 mg Oral q morning Marcelyn Bruins, MD   81 mg at 08/15/20 0839   feeding supplement (ENSURE ENLIVE / ENSURE PLUS) liquid 237 mL  237 mL Oral BID BM Sheikh, Omair Latif, DO   237 mL at 08/15/20 1000   furosemide (LASIX) tablet 20 mg  20 mg Oral q morning Marcelyn Bruins, MD   20 mg at 08/15/20 0839   HYDROcodone-acetaminophen (NORCO/VICODIN) 5-325 MG per tablet 1 tablet  1 tablet Oral Q6H PRN Marcelyn Bruins, MD   1 tablet at 08/15/20 1224   HYDROmorphone (DILAUDID) injection 0.5 mg  0.5 mg Intravenous Q4H PRN Marcelyn Bruins, MD       melatonin tablet 5 mg  5 mg Oral QHS Marcelyn Bruins, MD   5 mg at 08/14/20 2132   multivitamin with minerals tablet 1 tablet  1 tablet Oral Daily Raiford Noble [place], DO   1 tablet at 08/15/20 6578   nitroGLYCERIN (NITROSTAT) SL tablet 0.4 mg  0.4 mg Sublingual Q5 min PRN Marcelyn Bruins, MD       ondansetron [facility]) injection 4 mg  4 mg Intravenous Q6H PRN Raiford Noble Latif, DO   4 mg at 08/15/20 1220   polyethylene glycol (MIRALAX / GLYCOLAX) packet 17 g  17 g Oral BID Sheikh, Omair Latif, DO       polyvinyl alcohol (LIQUIFILM TEARS) 1.4 % ophthalmic solution 1 drop  1 drop Both Eyes QHS Heloise Purpura, RPH   1 drop at 08/14/20 2132   promethazine (PHENERGAN) 12.5 mg in sodium chloride 0.9 % 50 mL IVPB  12.5 mg Intravenous Once Lovey Newcomer T, NP       senna-docusate (Senokot-S) tablet 1 tablet  1 tablet Oral BID Raiford Noble [place], DO   1 tablet at 08/15/20 1224   sertraline (ZOLOFT) tablet 25 mg  25 mg Oral QHS Marcelyn Bruins, MD   25 mg at 08/14/20 2132     Discharge Medications: Please see discharge summary for a list of discharge medications.  Relevant Imaging Results:  Relevant Lab Results:   Additional Information [id number]  Reece Agar, LCSWA

## 2020-08-15 NOTE — Progress Notes (Signed)
PROGRESS NOTE    [name]  HDQ:[id number] DOB: [birth year] DOA: 08/12/2020 PCP: Leonard Downing, MD   Brief Narrative: The patient is 87 year old elderly Caucasian female with a past medical history significant for but not limited to atrial fibrillation, CAD status post multiple PCI, history of chronic diastolic CHF, depression and anxiety, history of prior hip fracture, hypertension, hyperlipidemia, rectal cancer status post partial proctectomy, history of TIA as well as other comorbidities who presents with a chief complaint of hip pain following a fall at home.  The patient states that she fell after she tripped over a comforter that she was taken off the bed.  She is unable to stand immediately but she was able to call for help and EMS came to evaluate and brought her to the ED.  She denied hitting her head but did endorse some head pain.  She states that she is ambulatory at baseline with assistance of a walker.  Patient's niece stated that she was somewhat weaker than her baseline for the last week or so after having a UTI and had decreased p.o. intake while she was on antibiotics.  She underwent further work-up and chest x-ray showed mild right atelectasis and a hip x-ray showed a questionable nondisplaced periprosthetic fracture.  CT of the right hip was done and was worrisome for findings consistent with a periprosthetic nondisplaced fracture however orthopedic surgery evaluated and recommended nonoperative management and recommended weightbearing as tolerated.  She had a head CT and CT spine that were done without any acute abnormalities.  PT OT evaluated and now recommending SNF and awaiting bed placement.  Assessment & Plan:   Principal Problem:   Closed fracture of right femur ([place]) Active Problems:   Rectal cancer, pT2uN0(pNX) s/p TEM partial proctectomy   CAD (coronary artery disease)   HTN (hypertension)   PAF (paroxysmal atrial fibrillation) (H[facility])   Atrial flutter  (H[facility])   Chronic diastolic CHF (congestive heart failure) (H[facility])   Hip pain   Malnutrition of moderate degree  Closed fracture of right femur -Patient presented after a fall when she tripped over comforter.  Unable to walk after fall.  Ambulatory prior to this with a walker.  -X-ray and CT showed of findings concerning for nondisplaced periprosthetic fracture of the right femur. - Ortho has been consulted, appreciate recommendations and they are recommending Non-operative Management and WBAT -Pain control with Norco  5-325 mg 1 tab po q6hprn for moderate pain and Dilaudid 0.4 mg sL for breakthrough pain -Hip/femur fracture protocol for positioning and care -PT/OT recommending SNF and awaiting placement    History of rectal cancer Constipation -Status post partial proctectomy about 10 years ago with colostomy in place. -WOC consult; Colostomy has some output in it today  -States she feels constipated and has not had much output in the ostomy and is frustrated. Change bowel regiment to Senna-Docusate 1 tab po BID and Miralax 17 grams po BID    Atrial Flutter -Continue home Amiodarone 200 mg po Daily -Continue to Monitor on Telemetry   CAD -Status post multiple PCI including 2 bare-metal stents -Continue home aspirin 81 mg po Daily and continue NTG 0.4 SL q68min PRN Chest Pain   Hypertension -Continue to Monitor BP per Protocol  -Last BP was 119/41  Chronic Diastolic CHF -Echo in March with EF 60-65%, mild hypertrophy, dilated right atria -Continue daily Lasix 20 mg po qHS -Strict I's and O's and Daily Weights   Hyponatremia -Improved. Na+ went from 131 -> 136 ->  134 -> 137 -Continue to Monitor and Trend -Repeat CMP in the AM  Thrombocytopenia -Mild and improved . Platelet Count went from 176 -> 147 -> 148 -> 179 -Continue to Monitor for S/Sx of Bleeding -Repeat CBC in the AM  Hyperglycemia -Mild. Blood Sugars ranging from 93-119 on Daily BMP/CMP; This AM was 105 -Continue  to Monitor closely  -Will not check HbA1c due to Age  Normocytic Anemia  -Patient's Hgb/Hct went from 12.7/38.9 -> 11.3/35.4 -> 11.0/33.5 -> 12.2/36.6 -Checked Anemia Panel and showed an iron level of 21, U IBC of 248, TIBC of 269, saturation ratios of 8%, ferritin level of 79, folate level 18.5, Vitamin B12 Level of 421 -Continue to Monitor for S/Sx of Bleeding; No overt bleeding noted -Repeat CBC in the AM    Depression and Anxiety -Continue home Sertraline 25 mg po qHS, melatonin   Underweight and Non-Severe Moderate Malnutrition in the Context of Chronic illness -Nutritionist consulted for further evaluation and Recc's  -They are recommending Ensure Enlive po BID  -C/w MVI Minerals Daily   DVT prophylaxis: SCDs Code Status: DO NOT RESUSCITATE  Family Communication: No family present at bedside  Disposition Plan: SNF  Status is: Observation  The patient remains OBS appropriate and will d/c before 2 midnights.  Dispo: The patient is from: Home              Anticipated d/c is to: SNF              Patient currently is not medically stable to d/c.   Difficult to place patient No  Consultants:  Orthopedic Surgery  Procedures: CT Scan Hip  Antimicrobials:  Anti-infectives (From admission, onward)    None        Subjective: Seen and examined at bedside and states her hip hurts when she moves it and is dreading getting up with therapy to go to the chair. No nausea or vomiting. States she feels constipated and took Miralax this AM but is frustrated that she has had very little output in her Ostomy. States she slept well last night. No other concerns or complaints at this time.   Objective: Vitals:   08/14/20 0825 08/14/20 1517 08/14/20 2024 08/15/20 0808  BP: 136/61 (!) 140/58 (!) 132/55 (!) 109/91  Pulse: 68 61 81 64  Resp: 18 18 15 18   Temp: 98.1 F (36.7 C) (!) 97.3 F (36.3 C) 98.4 F (36.9 C) 98 F (36.7 C)  TempSrc: Oral Oral Oral   SpO2: 94% 90% 91% 90%   Weight:      Height:        Intake/Output Summary (Last 24 hours) at 08/15/2020 0920 Last data filed at 08/15/2020 0845 Gross per 24 hour  Intake 240 ml  Output --  Net 240 ml    Filed Weights   08/12/20 1128  Weight: 42.6 kg   Examination: Physical Exam:  Constitutional: The patient is a thin elderly Caucasian female in NAD appears a little frustrated about not having a proper bowel movement. Not in any pain currently Eyes: Lids and conjunctivae normal, sclerae anicteric  ENMT: External Ears, Nose appear normal. Grossly normal hearing Neck: Appears normal, supple, no cervical masses, normal ROM, no appreciable thyromegaly; no JVD Respiratory: Diminished to auscultation bilaterally, no wheezing, rales, rhonchi or crackles. Normal respiratory effort and patient is not tachypenic. No accessory muscle use. Unlabored breathing  Cardiovascular: RRR, no murmurs / rubs / gallops. S1 and S2 auscultated. No extremity edema Abdomen: Soft, non-tender, non-distended.  Colostomy bag in place with a small amount of fecal output. Bowel sounds present  GU: Deferred. Musculoskeletal: No clubbing / cyanosis of digits/nails. No joint deformity upper and lower extremities.  Skin: No rashes, lesions, ulcers on a limited skin evaluation. No induration; Warm and dry.  Neurologic: CN 2-12 grossly intact with no focal deficits. Romberg sign and cerebellar reflexes not assessed.  Psychiatric: Normal judgment and insight. Alert and oriented x 3. Normal mood and appropriate affect.   Data Reviewed: I have personally reviewed following labs and imaging studies  CBC: Recent Labs  Lab 08/12/20 1600 08/13/20 1035 08/14/20 0141 08/15/20 0131  WBC 8.3 7.1 7.1 8.7  NEUTROABS 5.5 4.6 4.1 5.5  HGB 12.7 11.3* 11.0* 12.2  HCT 38.9 35.4* 33.5* 36.6  MCV 95.1 96.5 93.6 92.4  PLT 176 147* 148* 734    Basic Metabolic Panel: Recent Labs  Lab 08/12/20 1600 08/13/20 1035 08/14/20 0141 08/15/20 0131  NA 131*  136 134* 137  K 4.1 4.2 4.1 4.3  CL 95* 102 98 100  CO2 25 27 30 30   GLUCOSE 95 119* 93 105*  BUN 16 15 15 21   CREATININE 0.84 0.89 0.83 0.91  CALCIUM 9.2 9.3 9.2 9.2  MG  --  1.8 1.8 1.8  PHOS  --  2.9 3.0 3.7    GFR: Estimated Creatinine Clearance: 21 mL/min (by C-G formula based on SCr of 0.91 mg/dL). Liver Function Tests: Recent Labs  Lab 08/13/20 1035 08/14/20 0141 08/15/20 0131  AST 23 18 18   ALT 14 13 14   ALKPHOS 65 60 65  BILITOT 0.7 0.7 0.5  PROT 6.2* 5.8* 6.0*  ALBUMIN 2.9* 2.7* 2.7*    No results for input(s): LIPASE, AMYLASE in the last 168 hours. No results for input(s): AMMONIA in the last 168 hours. Coagulation Profile: Recent Labs  Lab 08/12/20 1600  INR 1.0    Cardiac Enzymes: No results for input(s): CKTOTAL, CKMB, CKMBINDEX, TROPONINI in the last 168 hours. BNP (last 3 results) No results for input(s): PROBNP in the last 8760 hours. HbA1C: No results for input(s): HGBA1C in the last 72 hours. CBG: No results for input(s): GLUCAP in the last 168 hours. Lipid Profile: No results for input(s): CHOL, HDL, LDLCALC, TRIG, CHOLHDL, LDLDIRECT in the last 72 hours. Thyroid Function Tests: No results for input(s): TSH, T4TOTAL, FREET4, T3FREE, THYROIDAB in the last 72 hours. Anemia Panel: Recent Labs    08/15/20 0131  VITAMINB12 421  FOLATE 18.5  FERRITIN 79  TIBC 269  IRON 21*  RETICCTPCT 1.2   Sepsis Labs: No results for input(s): PROCALCITON, LATICACIDVEN in the last 168 hours.  Recent Results (from the past 240 hour(s))  SARS CORONAVIRUS 2 (TAT 6-24 HRS) Nasopharyngeal Nasopharyngeal Swab     Status: None   Collection Time: 08/13/20 12:59 PM   Specimen: Nasopharyngeal Swab  Result Value Ref Range Status   SARS Coronavirus 2 NEGATIVE NEGATIVE Final    Comment: (NOTE) SARS-CoV-2 target nucleic acids are NOT DETECTED.  The SARS-CoV-2 RNA is generally detectable in upper and lower respiratory specimens during the acute phase of  infection. Negative results do not preclude SARS-CoV-2 infection, do not rule out co-infections with other pathogens, and should not be used as the sole basis for treatment or other patient management decisions. Negative results must be combined with clinical observations, patient history, and epidemiological information. The expected result is Negative.  Fact Sheet for Patients: SugarRoll.be  Fact Sheet for Healthcare Providers: https://www.woods-mathews.com/  This test is  not yet approved or cleared by the [place] and  has been authorized for detection and/or diagnosis of SARS-CoV-2 by FDA under an Emergency Use Authorization (EUA). This EUA will remain  in effect (meaning this test can be used) for the duration of the COVID-19 declaration under Se ction 564(b)(1) of the Act, 21 U.S.C. section 360bbb-3(b)(1), unless the authorization is terminated or revoked sooner.  Performed at [facility], [place] [address] [place], [place] [address]      RN Pressure Injury Documentation:     Estimated body mass index is 16.65 kg/m as calculated from the following:   Height as of this encounter: 5\' 3"  (1.6 m).   Weight as of this encounter: 42.6 kg.  Malnutrition Type: Nutrition Problem: Moderate Malnutrition Etiology: chronic illness (CHF) Malnutrition Characteristics: Signs/Symptoms: mild fat depletion, moderate fat depletion, mild muscle depletion, moderate muscle depletion Nutrition Interventions: Interventions: MVI, Ensure Enlive (each supplement provides 350kcal and 20 grams of protein), Liberalize Diet   Radiology Studies: No results found.   Scheduled Meds:  amiodarone  200 mg Oral Daily   aspirin EC  81 mg Oral q morning   feeding supplement  237 mL Oral BID BM   furosemide  20 mg Oral q morning   melatonin  5 mg Oral QHS   multivitamin with minerals  1 tablet Oral Daily   polyvinyl alcohol  1 drop Both Eyes QHS    sertraline  25 mg Oral QHS   Continuous Infusions:  promethazine (PHENERGAN) injection (IM or IVPB)      LOS: 1 day   Kerney Elbe, DO [facility] PAGER is on AMION  If 7PM-7AM, please contact night-coverage [email or web address]

## 2020-08-15 NOTE — TOC Initial Note (Signed)
Transition of Care [facility]) - Initial/Assessment Note    Patient Details  Name: [name] MRN: [id number] Date of Birth: February 23, 1916  [facility] of [facility]) CM/SW Contact:    Tresa Endo Phone Number: 08/15/2020, 1:01 PM  Clinical Narrative:                 9562: CSW spoke with pt about SNF, pt is agreeable and would like to try [facility] as her preferred facility and [facility] as a second choice. CSW faxed out pt and will follow up for bed availability. Pt asked CSW to contact her POA Thayer Headings (niece) to update her on the information she was given in previous conversation.   1306: CSW spoke with pt niece Thayer Headings at [phone]) to discuss the SNF process. Thayer Headings also had questions about ALF after Snf, she states she's knows about Medicaid being a factor in payment but has not done the application and would like for some resources when she comes to visit pt tomorrow. CSW will leave medicaid resources for POA.        Patient Goals and CMS Choice        Expected Discharge Plan and Services                                                Prior Living Arrangements/Services                       Activities of Daily Living Home Assistive Devices/Equipment: Eyeglasses, Hearing aid, Ostomy supplies, Walker (specify type) ADL Screening (condition at time of admission) Patient's cognitive ability adequate to safely complete daily activities?: Yes Is the patient deaf or have difficulty hearing?: No Does the patient have difficulty seeing, even when wearing glasses/contacts?: No Does the patient have difficulty concentrating, remembering, or making decisions?: No Patient able to express need for assistance with ADLs?: Yes Does the patient have difficulty dressing or bathing?: No Independently performs ADLs?: Yes (appropriate for developmental age) Does the patient have difficulty walking or climbing stairs?: Yes Weakness of Legs: Right Weakness of  Arms/Hands: None  Permission Sought/Granted                  Emotional Assessment              Admission diagnosis:  Fall [W19.XXXA] Closed fracture of right femur ([place]) [S72.91XA] Closed fracture of right hip, initial encounter ([place]) [S72.001A] Hip pain [M25.559] Patient Active Problem List   Diagnosis Date Noted   Malnutrition of moderate degree 08/14/2020   Hip pain 08/13/2020   Closed fracture of right femur ([place]) 08/12/2020   Syncope 06/29/2020   Atrial fibrillation with RVR ([place]) 05/03/2020   Other cirrhosis of liver ([place]) 05/01/2020   Paroxysmal atrial fibrillation with rapid ventricular response (HCC) 04/30/2020   Chronic diastolic CHF (congestive heart failure) ([place]) 04/30/2020   Elevated LFTs 04/30/2020   AKI (acute kidney injury) ([place]) 04/30/2020   Atrial flutter ([place]) 04/27/2020   Secondary hypercoagulable state ([place]) 04/27/2020   Closed fracture of multiple pubic rami, left, initial encounter ([place]) 09/06/2019   Slurred speech 02/23/2017   PAF (paroxysmal atrial fibrillation) ([place]) 02/23/2017   Depression with anxiety 02/23/2017   TIA (transient ischemic attack)    Fracture, tibia and fibula 07/06/2015   Tibia/fibula fracture 07/03/2015   Lung nodule seen on  imaging study 09/05/2013   Exposure to TB 09/05/2013   SOB (shortness of breath) 10/02/2011   Vascular skin changes 07/17/2011   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 01/11/2011   Anorexia post-op 12/08/2010   HTN (hypertension) 11/03/2010   Dizziness 09/15/2010   CAD (coronary artery disease)    Hyperlipidemia    Rectal cancer, pT2uN0(pNX) s/p TEM partial proctectomy 09/07/2010   PCP:  Leonard Downing, MD Pharmacy:   [facility] ([facility]), [place] - [address] [place] [address] Phone: [phone] Fax: [phone]  [facility] [address] [place] [address] Phone: [phone]  Fax: [phone]     Social Determinants of Health (SDOH) Interventions    Readmission Risk Interventions Readmission Risk Prevention Plan 05/04/2020  Transportation Screening Complete  PCP or Specialist Appt within 5-7 Days Complete  Home Care Screening Complete  Medication Review (RN CM) Complete  Some recent data might be hidden

## 2020-08-16 DIAGNOSIS — E440 Moderate protein-calorie malnutrition: Secondary | ICD-10-CM

## 2020-08-16 DIAGNOSIS — M25551 Pain in right hip: Secondary | ICD-10-CM | POA: Diagnosis not present

## 2020-08-16 DIAGNOSIS — S72091A Other fracture of head and neck of right femur, initial encounter for closed fracture: Secondary | ICD-10-CM | POA: Diagnosis not present

## 2020-08-16 DIAGNOSIS — I4892 Unspecified atrial flutter: Secondary | ICD-10-CM | POA: Diagnosis not present

## 2020-08-16 DIAGNOSIS — I5032 Chronic diastolic (congestive) heart failure: Secondary | ICD-10-CM | POA: Diagnosis not present

## 2020-08-16 DIAGNOSIS — S7291XA Unspecified fracture of right femur, initial encounter for closed fracture: Secondary | ICD-10-CM | POA: Diagnosis not present

## 2020-08-16 DIAGNOSIS — I2510 Atherosclerotic heart disease of native coronary artery without angina pectoris: Secondary | ICD-10-CM | POA: Diagnosis not present

## 2020-08-16 LAB — COMPREHENSIVE METABOLIC PANEL
ALT: 12 U/L (ref 0–44)
AST: 19 U/L (ref 15–41)
Albumin: 2.6 g/dL — ABNORMAL LOW (ref 3.5–5.0)
Alkaline Phosphatase: 63 U/L (ref 38–126)
Anion gap: 5 (ref 5–15)
BUN: 23 mg/dL (ref 8–23)
CO2: 31 mmol/L (ref 22–32)
Calcium: 9.2 mg/dL (ref 8.9–10.3)
Chloride: 99 mmol/L (ref 98–111)
Creatinine, Ser: 0.86 mg/dL (ref 0.44–1.00)
GFR, Estimated: 60 mL/min — ABNORMAL LOW (ref >60)
Glucose, Bld: 91 mg/dL (ref 70–99)
Potassium: 4.9 mmol/L (ref 3.5–5.1)
Sodium: 135 mmol/L (ref 135–145)
Total Bilirubin: 0.4 mg/dL (ref 0.3–1.2)
Total Protein: 6.1 g/dL — ABNORMAL LOW (ref 6.5–8.1)

## 2020-08-16 LAB — CBC WITH DIFFERENTIAL/PLATELET
Abs Immature Granulocytes: 0.03 K/uL (ref 0.00–0.07)
Basophils Absolute: 0 K/uL (ref 0.0–0.1)
Basophils Relative: 0 %
Eosinophils Absolute: 0.3 K/uL (ref 0.0–0.5)
Eosinophils Relative: 4 %
HCT: 36.4 % (ref 36.0–46.0)
Hemoglobin: 11.9 g/dL — ABNORMAL LOW (ref 12.0–15.0)
Immature Granulocytes: 0 %
Lymphocytes Relative: 31 %
Lymphs Abs: 2.5 K/uL (ref 0.7–4.0)
MCH: 30.7 pg (ref 26.0–34.0)
MCHC: 32.7 g/dL (ref 30.0–36.0)
MCV: 94.1 fL (ref 80.0–100.0)
Monocytes Absolute: 0.8 K/uL (ref 0.1–1.0)
Monocytes Relative: 9 %
Neutro Abs: 4.5 K/uL (ref 1.7–7.7)
Neutrophils Relative %: 56 %
Platelets: 187 K/uL (ref 150–400)
RBC: 3.87 MIL/uL (ref 3.87–5.11)
RDW: 15.5 % (ref 11.5–15.5)
WBC: 8.2 K/uL (ref 4.0–10.5)
nRBC: 0 % (ref 0.0–0.2)

## 2020-08-16 LAB — PHOSPHORUS: Phosphorus: 3.6 mg/dL (ref 2.5–4.6)

## 2020-08-16 LAB — MAGNESIUM: Magnesium: 1.9 mg/dL (ref 1.7–2.4)

## 2020-08-16 MED ORDER — ACETAMINOPHEN 325 MG PO TABS
650.0000 mg | ORAL_TABLET | Freq: Four times a day (QID) | ORAL | Status: DC | PRN
  Administered 2020-08-16 – 2020-08-18 (×5): 650 mg via ORAL
  Filled 2020-08-16 (×5): qty 2

## 2020-08-16 NOTE — Plan of Care (Signed)

## 2020-08-16 NOTE — Progress Notes (Addendum)
PROGRESS NOTE    [name]  RWE:[id number] DOB: [birth year] DOA: 08/12/2020 PCP: Leonard Downing, MD   Brief Narrative: The patient is 87 year old elderly Caucasian female with a past medical history significant for but not limited to atrial fibrillation, CAD status post multiple PCI, history of chronic diastolic CHF, depression and anxiety, history of prior hip fracture, hypertension, hyperlipidemia, rectal cancer status post partial proctectomy, history of TIA as well as other comorbidities who presents with a chief complaint of hip pain following a fall at home.  The patient states that she fell after she tripped over a comforter that she was taken off the bed.  She is unable to stand immediately but she was able to call for help and EMS came to evaluate and brought her to the ED.  She denied hitting her head but did endorse some head pain.  She states that she is ambulatory at baseline with assistance of a walker.  Patient's niece stated that she was somewhat weaker than her baseline for the last week or so after having a UTI and had decreased p.o. intake while she was on antibiotics.  She underwent further work-up and chest x-ray showed mild right atelectasis and a hip x-ray showed a questionable nondisplaced periprosthetic fracture.  CT of the right hip was done and was worrisome for findings consistent with a periprosthetic nondisplaced fracture however orthopedic surgery evaluated and recommended nonoperative management and recommended weightbearing as tolerated.  She had a head CT and CT spine that were done without any acute abnormalities.  PT OT evaluated and now recommending SNF and awaiting bed placement.  Assessment & Plan:   Principal Problem:   Closed fracture of right femur ([place]) Active Problems:   Rectal cancer, pT2uN0(pNX) s/p TEM partial proctectomy   CAD (coronary artery disease)   HTN (hypertension)   PAF (paroxysmal atrial fibrillation) (HCC)   Atrial flutter  (HCC)   Chronic diastolic CHF (congestive heart failure) (HCC)   Hip pain   Malnutrition of moderate degree  Closed fracture of right femur -Patient presented after a fall when she tripped over comforter.  Unable to walk after fall.  Ambulatory prior to this with a walker.  -X-ray and CT showed of findings concerning for nondisplaced periprosthetic fracture of the right femur. - Ortho has been consulted, appreciate recommendations and they are recommending Non-operative Management and WBAT -Pain control with Norco  5-325 mg 1 tab po q6hprn for moderate pain and Dilaudid 0.4 mg sL for breakthrough pain; Will add Acetaminophen 650 mg po q6hprn for Mild to Moderate Pain  -Hip/femur fracture protocol for positioning and care -PT/OT recommending SNF and awaiting placement    History of rectal cancer Constipation -Status post partial proctectomy about 10 years ago with colostomy in place. -WOC consult; Colostomy has some output in it today  -States she feels constipated and has not had much output in the ostomy and was frustrated yestedray. Changed bowel regiment to Senna-Docusate 1 tab po BID and Miralax 17 grams po BID and is having more output in he Ostomy   Hx of Atrial Flutter -Continue home Amiodarone 200 mg po Daily -Continue to Monitor on Telemetry and was in Sinus Rhythm this AM at a rate of 79  CAD -Status post multiple PCI including 2 bare-metal stents -Continue home Aspirin 81 mg po Daily and continue NTG 0.4 SL q60min PRN Chest Pain   Hypertension -Continue to Monitor BP per Protocol  -Last BP was 619/50  Chronic Diastolic CHF -Echo  in March with EF 60-65%, mild hypertrophy, dilated right atria -Continue daily Lasix 20 mg po qHS -Strict I's and O's and Daily Weights; Patient is  -610 mL  Hyponatremia -Improved. Na+ went from 131 -> 136 -> 134 -> 137 -> 135 -Continue to Monitor and Trend -Repeat CMP intermittently   Thrombocytopenia -Mild and improved . Platelet Count  went from 176 -> 147 -> 148 -> 179 -> 187 -Continue to Monitor for S/Sx of Bleeding -Repeat CBC intermittently  Hyperglycemia -Mild. Blood Sugars ranging from 93-119 on Daily BMP/CMP; This AM was 91 -Continue to Monitor closely  -Will not check HbA1c due to Age  Normocytic Anemia  -Patient's Hgb/Hct went from 12.7/38.9 -> 11.3/35.4 -> 11.0/33.5 -> 12.2/36.6 -> 11.9/36.4 -Checked Anemia Panel and showed an iron level of 21, U IBC of 248, TIBC of 269, saturation ratios of 8%, ferritin level of 79, folate level 18.5, Vitamin B12 Level of 421 -Continue to Monitor for S/Sx of Bleeding; No overt bleeding noted -Repeat CBC intermittently    Depression and Anxiety -Continue home Sertraline 25 mg po qHS, melatonin   Underweight and Non-Severe Moderate Malnutrition in the Context of Chronic illness -Nutritionist consulted for further evaluation and Recc's  -They are recommending Ensure Enlive po BID  -C/w MVI Minerals Daily   DVT prophylaxis: SCDs Code Status: DO NOT RESUSCITATE  Family Communication: No family present at bedside  Disposition Plan: SNF  Status is: Observation  The patient remains OBS appropriate and will d/c before 2 midnights.  Dispo: The patient is from: Home              Anticipated d/c is to: SNF              Patient currently is not medically stable to d/c.   Difficult to place patient No  Consultants:  Orthopedic Surgery  Procedures: CT Scan Hip  Antimicrobials:  Anti-infectives (From admission, onward)    None        Subjective: Seen and examined at bedside and had some pain this AM but improved with addition of Acetaminophen. No CP or SOB. States she only has pain when they move the right leg. States she is hungry and awaiting her lunch. No other concerns or complaints at this time.   Objective: Vitals:   08/14/20 2024 08/15/20 0808 08/15/20 2136 08/16/20 0724  BP: (!) 132/55 (!) 109/91 (!) 146/60 (!) 133/58  Pulse: 81 64 61 61  Resp: 15 18 16  16   Temp: 98.4 F (36.9 C) 98 F (36.7 C) 98.3 F (36.8 C) 97.7 F (36.5 C)  TempSrc: Oral  Oral Oral  SpO2: 91% 90% 98% 91%  Weight:      Height:        Intake/Output Summary (Last 24 hours) at 08/16/2020 0940 Last data filed at 08/16/2020 0600 Gross per 24 hour  Intake 240 ml  Output 1000 ml  Net -760 ml    Filed Weights   08/12/20 1128  Weight: 42.6 kg   Examination:  Physical Exam:  Constitutional: The patient is a thin elderly Caucasian female in NAD appears a calm sititng in the chair at bedside  Eyes: Lids and conjunctivae normal, sclerae anicteric  ENMT: External Ears, Nose appear normal. Grossly normal hearing.  Neck: Appears normal, supple, no cervical masses, normal ROM, no appreciable thyromegaly; no JVD Respiratory: Diminished to auscultation bilaterally, no wheezing, rales, rhonchi or crackles. Normal respiratory effort and patient is not tachypenic. No accessory muscle use. Unlabored breathing  Cardiovascular: RRR, no murmurs / rubs / gallops. S1 and S2 auscultated. No extremity edema.  Abdomen: Soft, non-tender, non-distended. Colostomy in place with brown stool in the bag. Bowel sounds positive.  GU: Deferred. Musculoskeletal: No clubbing / cyanosis of digits/nails. No joint deformity upper and lower extremities.  Skin: No rashes, lesions, ulcers on a limited skin evaluation. No induration; Warm and dry.  Neurologic: CN 2-12 grossly intact with no focal deficits. Romberg sign and cerebellar reflexes not assessed.  Psychiatric: Normal judgment and insight. Alert and oriented x 3. Normal mood and appropriate affect.   Data Reviewed: I have personally reviewed following labs and imaging studies  CBC: Recent Labs  Lab 08/12/20 1600 08/13/20 1035 08/14/20 0141 08/15/20 0131 08/16/20 0101  WBC 8.3 7.1 7.1 8.7 8.2  NEUTROABS 5.5 4.6 4.1 5.5 4.5  HGB 12.7 11.3* 11.0* 12.2 11.9*  HCT 38.9 35.4* 33.5* 36.6 36.4  MCV 95.1 96.5 93.6 92.4 94.1  PLT 176 147* 148*  179 102    Basic Metabolic Panel: Recent Labs  Lab 08/12/20 1600 08/13/20 1035 08/14/20 0141 08/15/20 0131 08/16/20 0101  NA 131* 136 134* 137 135  K 4.1 4.2 4.1 4.3 4.9  CL 95* 102 98 100 99  CO2 25 27 30 30 31   GLUCOSE 95 119* 93 105* 91  BUN 16 15 15 21 23   CREATININE 0.84 0.89 0.83 0.91 0.86  CALCIUM 9.2 9.3 9.2 9.2 9.2  MG  --  1.8 1.8 1.8 1.9  PHOS  --  2.9 3.0 3.7 3.6    GFR: Estimated Creatinine Clearance: 22.2 mL/min (by C-G formula based on SCr of 0.86 mg/dL). Liver Function Tests: Recent Labs  Lab 08/13/20 1035 08/14/20 0141 08/15/20 0131 08/16/20 0101  AST 23 18 18 19   ALT 14 13 14 12   ALKPHOS 65 60 65 63  BILITOT 0.7 0.7 0.5 0.4  PROT 6.2* 5.8* 6.0* 6.1*  ALBUMIN 2.9* 2.7* 2.7* 2.6*    No results for input(s): LIPASE, AMYLASE in the last 168 hours. No results for input(s): AMMONIA in the last 168 hours. Coagulation Profile: Recent Labs  Lab 08/12/20 1600  INR 1.0    Cardiac Enzymes: No results for input(s): CKTOTAL, CKMB, CKMBINDEX, TROPONINI in the last 168 hours. BNP (last 3 results) No results for input(s): PROBNP in the last 8760 hours. HbA1C: No results for input(s): HGBA1C in the last 72 hours. CBG: No results for input(s): GLUCAP in the last 168 hours. Lipid Profile: No results for input(s): CHOL, HDL, LDLCALC, TRIG, CHOLHDL, LDLDIRECT in the last 72 hours. Thyroid Function Tests: No results for input(s): TSH, T4TOTAL, FREET4, T3FREE, THYROIDAB in the last 72 hours. Anemia Panel: Recent Labs    08/15/20 0131  VITAMINB12 421  FOLATE 18.5  FERRITIN 79  TIBC 269  IRON 21*  RETICCTPCT 1.2    Sepsis Labs: No results for input(s): PROCALCITON, LATICACIDVEN in the last 168 hours.  Recent Results (from the past 240 hour(s))  SARS CORONAVIRUS 2 (TAT 6-24 HRS) Nasopharyngeal Nasopharyngeal Swab     Status: None   Collection Time: 08/13/20 12:59 PM   Specimen: Nasopharyngeal Swab  Result Value Ref Range Status   SARS  Coronavirus 2 NEGATIVE NEGATIVE Final    Comment: (NOTE) SARS-CoV-2 target nucleic acids are NOT DETECTED.  The SARS-CoV-2 RNA is generally detectable in upper and lower respiratory specimens during the acute phase of infection. Negative results do not preclude SARS-CoV-2 infection, do not rule out co-infections with other pathogens, and should not be  used as the sole basis for treatment or other patient management decisions. Negative results must be combined with clinical observations, patient history, and epidemiological information. The expected result is Negative.  Fact Sheet for Patients: SugarRoll.be  Fact Sheet for Healthcare Providers: https://www.woods-mathews.com/  This test is not yet approved or cleared by the [place] FDA and  has been authorized for detection and/or diagnosis of SARS-CoV-2 by FDA under an Emergency Use Authorization (EUA). This EUA will remain  in effect (meaning this test can be used) for the duration of the COVID-19 declaration under Se ction 564(b)(1) of the Act, 21 U.S.C. section 360bbb-3(b)(1), unless the authorization is terminated or revoked sooner.  Performed at [facility], [place] [address], [place], [place] [address]      RN Pressure Injury Documentation:     Estimated body mass index is 16.65 kg/m as calculated from the following:   Height as of this encounter: 5\' 3"  (1.6 m).   Weight as of this encounter: 42.6 kg.  Malnutrition Type: Nutrition Problem: Moderate Malnutrition Etiology: chronic illness (CHF) Malnutrition Characteristics: Signs/Symptoms: mild fat depletion, moderate fat depletion, mild muscle depletion, moderate muscle depletion Nutrition Interventions: Interventions: MVI, Ensure Enlive (each supplement provides 350kcal and 20 grams of protein), Liberalize Diet   Radiology Studies: No results found.   Scheduled Meds:  amiodarone  200 mg Oral Daily   aspirin  EC  81 mg Oral q morning   feeding supplement  237 mL Oral BID BM   furosemide  20 mg Oral q morning   melatonin  5 mg Oral QHS   multivitamin with minerals  1 tablet Oral Daily   polyethylene glycol  17 g Oral BID   polyvinyl alcohol  1 drop Both Eyes QHS   senna-docusate  1 tablet Oral BID   sertraline  25 mg Oral QHS   Continuous Infusions:  promethazine (PHENERGAN) injection (IM or IVPB)      LOS: 1 day   Kerney Elbe, DO [facility] PAGER is on AMION  If 7PM-7AM, please contact night-coverage [email or web address]

## 2020-08-16 NOTE — Plan of Care (Signed)

## 2020-08-17 DIAGNOSIS — I4892 Unspecified atrial flutter: Secondary | ICD-10-CM | POA: Diagnosis not present

## 2020-08-17 DIAGNOSIS — S7291XA Unspecified fracture of right femur, initial encounter for closed fracture: Secondary | ICD-10-CM | POA: Diagnosis not present

## 2020-08-17 DIAGNOSIS — M25551 Pain in right hip: Secondary | ICD-10-CM | POA: Diagnosis not present

## 2020-08-17 DIAGNOSIS — S72091A Other fracture of head and neck of right femur, initial encounter for closed fracture: Secondary | ICD-10-CM | POA: Diagnosis not present

## 2020-08-17 DIAGNOSIS — I2510 Atherosclerotic heart disease of native coronary artery without angina pectoris: Secondary | ICD-10-CM | POA: Diagnosis not present

## 2020-08-17 DIAGNOSIS — M25559 Pain in unspecified hip: Secondary | ICD-10-CM | POA: Diagnosis not present

## 2020-08-17 LAB — SARS CORONAVIRUS 2 (TAT 6-24 HRS): SARS Coronavirus 2: POSITIVE — AB

## 2020-08-17 NOTE — TOC Progression Note (Signed)
Transition of Care [facility]) - Progression Note    Patient Details  Name: [name] MRN: [id number] Date of Birth: [birth year]  Transition of Care [facility]) CM/SW Contact  Milinda Antis, [place] Phone Number: 08/17/2020, 11:37 AM  Clinical Narrative:     10:41-  CSW received a message from [place] with [facility] that the patient can be accepted to the facility.  11:28-  CSW contacted the patient and informed the patient that [facility] is willing to accept.  The patient stated that she wants to go to [facility] because it is closer for her family to get to her.    11:31-  CSW called [facility] to inquire about whether they manage the patient's insurance.  CSW was informed that the patient's insurance is managed through [facility].  11:35-  CSW contacted Nicki with [facility].  The facility is reviewing the referral and will let CSW know I they can accept.    11:40-  CSW messaged the [facility] CMA's to request that insurance auth be started for the patient.    11:41-  CSW informed that [facility] will extend a bed offer.  11:44- CSW informed the patient that [facility] can accept.         Expected Discharge Plan and Services                                                 Social Determinants of Health (SDOH) Interventions    Readmission Risk Interventions Readmission Risk Prevention Plan 05/04/2020  Transportation Screening Complete  PCP or Specialist Appt within 5-7 Days Complete  Home Care Screening Complete  Medication Review (RN CM) Complete  Some recent data might be hidden

## 2020-08-17 NOTE — Progress Notes (Signed)
COVID swab collected and sent to lab.

## 2020-08-17 NOTE — Progress Notes (Signed)
PROGRESS NOTE    [name]  ZOX:[id number] DOB: 1916-05-19 DOA: 08/12/2020 PCP: Leonard Downing, MD   Brief Narrative: The patient is 87 year old elderly Caucasian female with a past medical history significant for but not limited to atrial fibrillation, CAD status post multiple PCI, history of chronic diastolic CHF, depression and anxiety, history of prior hip fracture, hypertension, hyperlipidemia, rectal cancer status post partial proctectomy, history of TIA as well as other comorbidities who presents with a chief complaint of hip pain following a fall at home.  The patient states that she fell after she tripped over a comforter that she was taken off the bed.  She is unable to stand immediately but she was able to call for help and EMS came to evaluate and brought her to the ED.  She denied hitting her head but did endorse some head pain.  She states that she is ambulatory at baseline with assistance of a walker.  Patient's niece stated that she was somewhat weaker than her baseline for the last week or so after having a UTI and had decreased p.o. intake while she was on antibiotics.  She underwent further work-up and chest x-ray showed mild right atelectasis and a hip x-ray showed a questionable nondisplaced periprosthetic fracture.  CT of the right hip was done and was worrisome for findings consistent with a periprosthetic nondisplaced fracture however orthopedic surgery evaluated and recommended nonoperative management and recommended weightbearing as tolerated.  She had a head CT and CT spine that were done without any acute abnormalities.  PT OT evaluated and now recommending SNF and awaiting bed placement.  Assessment & Plan:   Principal Problem:   Closed fracture of right femur ([place]) Active Problems:   Rectal cancer, pT2uN0(pNX) s/p TEM partial proctectomy   CAD (coronary artery disease)   HTN (hypertension)   PAF (paroxysmal atrial fibrillation) (HCC)   Atrial flutter  (HCC)   Chronic diastolic CHF (congestive heart failure) (HCC)   Hip pain   Malnutrition of moderate degree  Closed fracture of right femur -Patient presented after a fall when she tripped over comforter.  Unable to walk after fall.  Ambulatory prior to this with a walker.  -X-ray and CT showed of findings concerning for nondisplaced periprosthetic fracture of the right femur. - Ortho has been consulted, appreciate recommendations and they are recommending Non-operative Management and WBAT -Pain control with Norco  5-325 mg 1 tab po q6hprn for moderate pain and Dilaudid 0.4 mg sL for breakthrough pain; Will add Acetaminophen 650 mg po q6hprn for Mild to Moderate Pain  -Hip/femur fracture protocol for positioning and care -PT/OT recommending SNF and awaiting placement; Wanting to go to [facility]    History of rectal cancer Constipation -Status post partial proctectomy about 10 years ago with colostomy in place. -WOC consult; Colostomy has some output in it today  -States she feels constipated and has not had much output in the ostomy and was frustrated yestedray. Changed bowel regiment to Senna-Docusate 1 tab po BID and Miralax 17 grams po BID and is having more output in he Ostomy   Hx of Atrial Flutter -Continue home Amiodarone 200 mg po Daily -Continue to Monitor on Telemetry  CAD -Status post multiple PCI including 2 bare-metal stents -Continue home Aspirin 81 mg po Daily and continue NTG 0.4 SL q50min PRN Chest Pain   Hypertension -Continue to Monitor BP per Protocol  -Last BP was 811/91  Chronic Diastolic CHF -Echo in March with EF 60-65%, mild  hypertrophy, dilated right atria -Continue daily Lasix 20 mg po qHS -Strict I's and O's and Daily Weights; Patient is  -220 mL  Hyponatremia -Improved. Na+ went from 131 -> 136 -> 134 -> 137 -> 135 on last check  -Continue to Monitor and Trend -Repeat CMP intermittently and repeat in the AM   Thrombocytopenia -Mild and improved .  Platelet Count went from 176 -> 147 -> 148 -> 179 -> 187 on last check -Continue to Monitor for S/Sx of Bleeding -Repeat CBC intermittently and repeat in the AM   Hyperglycemia -Mild. Blood Sugars ranging from 93-119 on Daily BMP/CMP; Yesterday AM was 91 -Continue to Monitor closely  -Will not check HbA1c due to Age  Normocytic Anemia  -Patient's Hgb/Hct went from 12.7/38.9 -> 11.3/35.4 -> 11.0/33.5 -> 12.2/36.6 -> 11.9/36.4 on last check -Checked Anemia Panel and showed an iron level of 21, U IBC of 248, TIBC of 269, saturation ratios of 8%, ferritin level of 79, folate level 18.5, Vitamin B12 Level of 421 -Continue to Monitor for S/Sx of Bleeding; No overt bleeding noted -Repeat CBC intermittently and repeat in the AM    Depression and Anxiety -Continue home Sertraline 25 mg po qHS, melatonin   Underweight and Non-Severe Moderate Malnutrition in the Context of Chronic illness -Nutritionist consulted for further evaluation and Recc's  -They are recommending Ensure Enlive po BID  -C/w MVI Minerals Daily   DVT prophylaxis: SCDs Code Status: DO NOT RESUSCITATE  Family Communication: No family present at bedside  Disposition Plan: SNF  Status is: Observation  The patient remains OBS appropriate and will d/c before 2 midnights.  Dispo: The patient is from: Home              Anticipated d/c is to: SNF              Patient currently is not medically stable to d/c.   Difficult to place patient No  Consultants:  Orthopedic Surgery  Procedures: CT Scan Hip  Antimicrobials:  Anti-infectives (From admission, onward)    None        Subjective: Seen and examined at bedside and states that she had a "miserable night." Did not fall asleep until 3:30 or 4:00 AM. Was a little nauseous this AM. No CP or SOB. Has pain in Right Leg when she moves it. Wanting to leave the hospital and go to rehab. No other concerns or complaints at this time.   Objective: Vitals:   08/16/20 0724  08/16/20 1523 08/16/20 2107 08/17/20 0718  BP: (!) 133/58 (!) 123/48 (!) 143/60 138/73  Pulse: 61 (!) 59 77 78  Resp: 16 16 16 17   Temp: 97.7 F (36.5 C) (!) 97.4 F (36.3 C) 98.5 F (36.9 C) 98 F (36.7 C)  TempSrc: Oral Oral Oral Oral  SpO2: 91% 96% 97% 93%  Weight:      Height:        Intake/Output Summary (Last 24 hours) at 08/17/2020 0954 Last data filed at 08/16/2020 1900 Gross per 24 hour  Intake 590 ml  Output 450 ml  Net 140 ml    Filed Weights   08/12/20 1128  Weight: 42.6 kg   Examination: Physical Exam:  Constitutional: The patient is a thin elderly Caucasian female in mild distress appears anxious, frustrated and uncomfortable  Eyes: Lids and conjunctivae normal, sclerae anicteric  ENMT: External Ears, Nose appear normal. A little hard of hearing.  Neck: Appears normal, supple, no cervical masses, normal ROM, no appreciable  thyromegaly Respiratory: Diminished to auscultation bilaterally, no wheezing, rales, rhonchi or crackles. Normal respiratory effort and patient is not tachypenic. No accessory muscle use. Unlabored breathing  Cardiovascular: RRR, no murmurs / rubs / gallops. No appreciable LE edema.  Abdomen: Soft, non-tender, non-distended. Colostomy in place with brown stool in it. Bowel sounds positive.  GU: Deferred. Musculoskeletal: No clubbing / cyanosis of digits/nails. No joint deformity upper and lower extremities. Skin: No rashes, lesions, ulcers on a limited skin evaluation. No induration; Warm and dry.  Neurologic: CN 2-12 grossly intact with no focal deficits. Romberg sign and cerebellar reflexes not assessed.  Psychiatric: Normal judgment and insight. Alert and oriented x 3. Anxious mood and appropriate affect.   Data Reviewed: I have personally reviewed following labs and imaging studies  CBC: Recent Labs  Lab 08/12/20 1600 08/13/20 1035 08/14/20 0141 08/15/20 0131 08/16/20 0101  WBC 8.3 7.1 7.1 8.7 8.2  NEUTROABS 5.5 4.6 4.1 5.5 4.5   HGB 12.7 11.3* 11.0* 12.2 11.9*  HCT 38.9 35.4* 33.5* 36.6 36.4  MCV 95.1 96.5 93.6 92.4 94.1  PLT 176 147* 148* 179 400    Basic Metabolic Panel: Recent Labs  Lab 08/12/20 1600 08/13/20 1035 08/14/20 0141 08/15/20 0131 08/16/20 0101  NA 131* 136 134* 137 135  K 4.1 4.2 4.1 4.3 4.9  CL 95* 102 98 100 99  CO2 25 27 30 30 31   GLUCOSE 95 119* 93 105* 91  BUN 16 15 15 21 23   CREATININE 0.84 0.89 0.83 0.91 0.86  CALCIUM 9.2 9.3 9.2 9.2 9.2  MG  --  1.8 1.8 1.8 1.9  PHOS  --  2.9 3.0 3.7 3.6    GFR: Estimated Creatinine Clearance: 22.2 mL/min (by C-G formula based on SCr of 0.86 mg/dL). Liver Function Tests: Recent Labs  Lab 08/13/20 1035 08/14/20 0141 08/15/20 0131 08/16/20 0101  AST 23 18 18 19   ALT 14 13 14 12   ALKPHOS 65 60 65 63  BILITOT 0.7 0.7 0.5 0.4  PROT 6.2* 5.8* 6.0* 6.1*  ALBUMIN 2.9* 2.7* 2.7* 2.6*    No results for input(s): LIPASE, AMYLASE in the last 168 hours. No results for input(s): AMMONIA in the last 168 hours. Coagulation Profile: Recent Labs  Lab 08/12/20 1600  INR 1.0    Cardiac Enzymes: No results for input(s): CKTOTAL, CKMB, CKMBINDEX, TROPONINI in the last 168 hours. BNP (last 3 results) No results for input(s): PROBNP in the last 8760 hours. HbA1C: No results for input(s): HGBA1C in the last 72 hours. CBG: No results for input(s): GLUCAP in the last 168 hours. Lipid Profile: No results for input(s): CHOL, HDL, LDLCALC, TRIG, CHOLHDL, LDLDIRECT in the last 72 hours. Thyroid Function Tests: No results for input(s): TSH, T4TOTAL, FREET4, T3FREE, THYROIDAB in the last 72 hours. Anemia Panel: Recent Labs    08/15/20 0131  VITAMINB12 421  FOLATE 18.5  FERRITIN 79  TIBC 269  IRON 21*  RETICCTPCT 1.2    Sepsis Labs: No results for input(s): PROCALCITON, LATICACIDVEN in the last 168 hours.  Recent Results (from the past 240 hour(s))  SARS CORONAVIRUS 2 (TAT 6-24 HRS) Nasopharyngeal Nasopharyngeal Swab     Status: None    Collection Time: 08/13/20 12:59 PM   Specimen: Nasopharyngeal Swab  Result Value Ref Range Status   SARS Coronavirus 2 NEGATIVE NEGATIVE Final    Comment: (NOTE) SARS-CoV-2 target nucleic acids are NOT DETECTED.  The SARS-CoV-2 RNA is generally detectable in upper and lower respiratory specimens during the acute  phase of infection. Negative results do not preclude SARS-CoV-2 infection, do not rule out co-infections with other pathogens, and should not be used as the sole basis for treatment or other patient management decisions. Negative results must be combined with clinical observations, patient history, and epidemiological information. The expected result is Negative.  Fact Sheet for Patients: SugarRoll.be  Fact Sheet for Healthcare Providers: https://www.woods-mathews.com/  This test is not yet approved or cleared by the [place] FDA and  has been authorized for detection and/or diagnosis of SARS-CoV-2 by FDA under an Emergency Use Authorization (EUA). This EUA will remain  in effect (meaning this test can be used) for the duration of the COVID-19 declaration under Se ction 564(b)(1) of the Act, 21 U.S.C. section 360bbb-3(b)(1), unless the authorization is terminated or revoked sooner.  Performed at [facility], [place] [address], [place], [place] [address]      RN Pressure Injury Documentation:     Estimated body mass index is 16.65 kg/m as calculated from the following:   Height as of this encounter: 5\' 3"  (1.6 m).   Weight as of this encounter: 42.6 kg.  Malnutrition Type: Nutrition Problem: Moderate Malnutrition Etiology: chronic illness (CHF) Malnutrition Characteristics: Signs/Symptoms: mild fat depletion, moderate fat depletion, mild muscle depletion, moderate muscle depletion Nutrition Interventions: Interventions: MVI, Ensure Enlive (each supplement provides 350kcal and 20 grams of protein), Liberalize  Diet   Radiology Studies: No results found.   Scheduled Meds:  amiodarone  200 mg Oral Daily   aspirin EC  81 mg Oral q morning   feeding supplement  237 mL Oral BID BM   furosemide  20 mg Oral q morning   melatonin  5 mg Oral QHS   multivitamin with minerals  1 tablet Oral Daily   polyethylene glycol  17 g Oral BID   polyvinyl alcohol  1 drop Both Eyes QHS   senna-docusate  1 tablet Oral BID   sertraline  25 mg Oral QHS   Continuous Infusions:  promethazine (PHENERGAN) injection (IM or IVPB)      LOS: 0 days   Kerney Elbe, DO [facility] PAGER is on AMION  If 7PM-7AM, please contact night-coverage [email or web address]

## 2020-08-17 NOTE — Progress Notes (Signed)
Physical Therapy Treatment Patient Details Name: [name] MRN: [id number] DOB: 06/28/1916 Today's Date: 08/17/2020    History of Present Illness [name] is a 87 y.o. female admitted on 08/12/20 for hip pain following a fall at home.Patient can be weightbearing as tolerated.  No operative intervention.  Pt with medical history significant for atrial fibrillation, CAD status post multiple PCI, diastolic heart failure, depression, anxiety, prior hip fracture, hypertension, rectal cancer status post partial proctectomy (has ostomy bag), and TIA.    PT Comments    Pt making very good progress with goals met and updated.  She was able to ambulated 48' with RW and min A.  She is very motivated and pleased with her progress.  Good pain control.  Continue to advance as able.    Follow Up Recommendations  SNF     Equipment Recommendations  None recommended by PT    Recommendations for Other Services       Precautions / Restrictions Precautions Precautions: Fall Precaution Comments: L LQ ostomy bag Restrictions Weight Bearing Restrictions: Yes RLE Weight Bearing: Weight bearing as tolerated    Mobility  Bed Mobility Overal bed mobility: Needs Assistance Bed Mobility: Supine to Sit     Supine to sit: Min assist;HOB elevated     General bed mobility comments: Min A for R LE and to complete scoot to EOB; increased time and use of rails    Transfers Overall transfer level: Needs assistance Equipment used: Rolling walker (2 wheeled) Transfers: Sit to/from Stand Sit to Stand: Min assist         General transfer comment: Min A to rise with cues for hand placement  Ambulation/Gait Ambulation/Gait assistance: Min assist Gait Distance (Feet): 35 Feet Assistive device: Rolling walker (2 wheeled) Gait Pattern/deviations: Step-to pattern;Decreased stride length     General Gait Details: Min A to ambulate 35' to door and back to chair.  Min cues for posture and RW  proximity.  Pt very pleased.   Stairs             Wheelchair Mobility    Modified Rankin (Stroke Patients Only)       Balance Overall balance assessment: Needs assistance Sitting-balance support: Feet supported Sitting balance-Leahy Scale: Good     Standing balance support: Bilateral upper extremity supported Standing balance-Leahy Scale: Poor Standing balance comment: requiring RW                            Cognition Arousal/Alertness: Awake/alert Behavior During Therapy: WFL for tasks assessed/performed Overall Cognitive Status: Within Functional Limits for tasks assessed                                        Exercises General Exercises - Lower Extremity Ankle Circles/Pumps: AROM;Both;10 reps;Seated Long Arc Quad: AROM;Both;15 reps;Seated Heel Slides: AROM;Both;10 reps;Supine Hip ABduction/ADduction: AROM;Supine;Both;10 reps Hip Flexion/Marching: AROM;Both;10 reps;Seated Other Exercises Other Exercises: Cues for correct motion - pt tolerating well and exceeded expections for expected ROM    General Comments        Pertinent Vitals/Pain Pain Assessment: 0-10 Pain Score: 1  Pain Location: R LE/hip with WB and movement Pain Descriptors / Indicators: Discomfort Pain Intervention(s): Limited activity within patient's tolerance;Monitored during session    Home Living  Prior Function            PT Goals (current goals can now be found in the care plan section) Acute Rehab PT Goals Patient Stated Goal: go to rehab and go to a home PT Goal Formulation: With patient Time For Goal Achievement: 08/27/20 Potential to Achieve Goals: Good Progress towards PT goals: Progressing toward goals    Frequency    Min 3X/week      PT Plan Current plan remains appropriate    Co-evaluation              AM-PAC PT "6 Clicks" Mobility   Outcome Measure  Help needed turning from your back to your  side while in a flat bed without using bedrails?: A Little Help needed moving from lying on your back to sitting on the side of a flat bed without using bedrails?: A Little Help needed moving to and from a bed to a chair (including a wheelchair)?: A Little Help needed standing up from a chair using your arms (e.g., wheelchair or bedside chair)?: A Little Help needed to walk in hospital room?: A Little Help needed climbing 3-5 steps with a railing? : A Lot 6 Click Score: 17    End of Session Equipment Utilized During Treatment: Gait belt Activity Tolerance: Patient tolerated treatment well Patient left: with chair alarm set;in chair;with call bell/phone within reach Nurse Communication: Mobility status PT Visit Diagnosis: Muscle weakness (generalized) (M62.81);Difficulty in walking, not elsewhere classified (R26.2);Pain Pain - Right/Left: Right Pain - part of body: Hip     Time: 8295-6213 PT Time Calculation (min) (ACUTE ONLY): 23 min  Charges:  $Gait Training: 8-22 mins $Therapeutic Exercise: 8-22 mins                     Abran Richard, PT Acute Rehab Services Pager [phone] [facility] [place] 08/17/2020, 1:32 PM

## 2020-08-17 NOTE — Plan of Care (Signed)

## 2020-08-17 NOTE — Progress Notes (Signed)
Occupational Therapy Treatment Patient Details Name: [name] MRN: [id number] DOB: 06-24-1916 Today's Date: 08/17/2020    History of present illness [name] is a 87 y.o. female admitted on 08/12/20 for hip pain following a fall at home.Patient can be weightbearing as tolerated.  No operative intervention.  Pt with medical history significant for atrial fibrillation, CAD status post multiple PCI, diastolic heart failure, depression, anxiety, prior hip fracture, hypertension, rectal cancer status post partial proctectomy (has ostomy bag), and TIA.   OT comments  Eve is progressing towards her goals well. Upon arrival she requested to get up and move. Pt was mod A to rise into standing with increased time. Pt was min/mod A to ambulate with rw short distance, pain and fatigue limiting pt this session. Pt required vc for sequencing with rw and for safety. Pt max A for lotion application to distal BLE. Pt benefits from continued OT acutely to progress function in ADLs and mobility. D/c plan remains appropriate.    Follow Up Recommendations  SNF;Supervision/Assistance - 24 hour    Equipment Recommendations  Other (comment)       Precautions / Restrictions Precautions Precautions: Fall Precaution Comments: L LQ ostomy bag Restrictions Weight Bearing Restrictions: Yes RLE Weight Bearing: Weight bearing as tolerated       Mobility Bed Mobility Overal bed mobility: Needs Assistance Bed Mobility: Supine to Sit     Supine to sit: Min assist;HOB elevated     General bed mobility comments: pt in recliner upon arrival    Transfers Overall transfer level: Needs assistance Equipment used: Rolling walker (2 wheeled) Transfers: Sit to/from Stand Sit to Stand: Mod assist         General transfer comment: mod A to ride into standing and steady with rw    Balance Overall balance assessment: Needs assistance Sitting-balance support: Feet supported Sitting balance-Leahy  Scale: Good     Standing balance support: Bilateral upper extremity supported Standing balance-Leahy Scale: Poor Standing balance comment: requiring RW               ADL either performed or assessed with clinical judgement   ADL Overall ADL's : Needs assistance/impaired     Grooming: Maximal assistance;Sitting Grooming Details (indicate cue type and reason): Pt requested to apply lotion to bilat legs, max A due to pain with reaching             Functional mobility during ADLs: Moderate assistance;Rolling walker General ADL Comments: session focused on functional mobility      Cognition Arousal/Alertness: Awake/alert Behavior During Therapy: WFL for tasks assessed/performed Overall Cognitive Status: Within Functional Limits for tasks assessed            Exercises General Exercises - Lower Extremity Ankle Circles/Pumps: AROM;Both;10 reps;Seated Long Arc Quad: AROM;Both;15 reps;Seated Heel Slides: AROM;Both;10 reps;Supine Hip ABduction/ADduction: AROM;Supine;Both;10 reps Hip Flexion/Marching: AROM;Both;10 reps;Seated Other Exercises Other Exercises: Cues for correct motion - pt tolerating well and exceeded expections for expected ROM      General Comments VSS on RA    Pertinent Vitals/ Pain       Pain Assessment: Faces Pain Score: 1  Faces Pain Scale: Hurts whole lot Pain Location: R LE/hip with WB and movement Pain Descriptors / Indicators: Discomfort Pain Intervention(s): Limited activity within patient's tolerance;Monitored during session   Frequency  Min 2X/week        Progress Toward Goals  OT Goals(current goals can now be found in the care plan section)  Progress towards OT  goals: Progressing toward goals  Acute Rehab OT Goals Patient Stated Goal: go to rehab and go to a home OT Goal Formulation: With patient Time For Goal Achievement: 08/27/20 Potential to Achieve Goals: Good ADL Goals Pt Will Perform Grooming: with min guard  assist;with supervision;sitting Pt Will Perform Upper Body Bathing: with min guard assist;with supervision;sitting Pt Will Perform Lower Body Bathing: with mod assist;sitting/lateral leans Pt Will Perform Upper Body Dressing: with min guard assist;with supervision;sitting Pt Will Transfer to Toilet: with max assist;with mod assist;stand pivot transfer;bedside commode Additional ADL Goal #1: Pt will complete bed mobility mod - min A to sit EOB in prep for ADL tasks  Plan Discharge plan remains appropriate       AM-PAC OT "6 Clicks" Daily Activity     Outcome Measure   Help from another person eating meals?: None Help from another person taking care of personal grooming?: A Little Help from another person toileting, which includes using toliet, bedpan, or urinal?: A Lot Help from another person bathing (including washing, rinsing, drying)?: A Lot Help from another person to put on and taking off regular upper body clothing?: A Little Help from another person to put on and taking off regular lower body clothing?: A Lot 6 Click Score: 16    End of Session Equipment Utilized During Treatment: Gait belt;Rolling walker  OT Visit Diagnosis: Unsteadiness on feet (R26.81);Other abnormalities of gait and mobility (R26.89);History of falling (Z91.81);Muscle weakness (generalized) (M62.81);Pain Pain - Right/Left: Right Pain - part of body: Hip;Leg   Activity Tolerance Patient tolerated treatment well;Patient limited by pain   Patient Left in chair;with call bell/phone within reach;with chair alarm set   Nurse Communication Mobility status        Time: 1510-1520 OT Time Calculation (min): 10 min  Charges: OT General Charges $OT Visit: 1 Visit OT Treatments $Therapeutic Activity: 8-22 mins     Adelis Docter A Keelin Sheridan 08/17/2020, 3:31 PM

## 2020-08-17 NOTE — Plan of Care (Signed)
  Problem: Health Behavior/Discharge Planning: Goal: Ability to manage health-related needs will improve Outcome: Progressing   Problem: Clinical Measurements: Goal: Ability to maintain clinical measurements within normal limits will improve Outcome: Progressing   

## 2020-08-18 DIAGNOSIS — M25551 Pain in right hip: Secondary | ICD-10-CM | POA: Diagnosis not present

## 2020-08-18 DIAGNOSIS — I2583 Coronary atherosclerosis due to lipid rich plaque: Secondary | ICD-10-CM

## 2020-08-18 DIAGNOSIS — I5032 Chronic diastolic (congestive) heart failure: Secondary | ICD-10-CM | POA: Diagnosis not present

## 2020-08-18 DIAGNOSIS — I4892 Unspecified atrial flutter: Secondary | ICD-10-CM

## 2020-08-18 DIAGNOSIS — S72091A Other fracture of head and neck of right femur, initial encounter for closed fracture: Secondary | ICD-10-CM

## 2020-08-18 DIAGNOSIS — I2510 Atherosclerotic heart disease of native coronary artery without angina pectoris: Secondary | ICD-10-CM | POA: Diagnosis not present

## 2020-08-18 DIAGNOSIS — S7291XA Unspecified fracture of right femur, initial encounter for closed fracture: Secondary | ICD-10-CM | POA: Diagnosis not present

## 2020-08-18 LAB — CBC WITH DIFFERENTIAL/PLATELET
Abs Immature Granulocytes: 0.03 10*3/uL (ref 0.00–0.07)
Basophils Absolute: 0 10*3/uL (ref 0.0–0.1)
Basophils Relative: 0 %
Eosinophils Absolute: 0.3 10*3/uL (ref 0.0–0.5)
Eosinophils Relative: 4 %
HCT: 35.9 % — ABNORMAL LOW (ref 36.0–46.0)
Hemoglobin: 11.6 g/dL — ABNORMAL LOW (ref 12.0–15.0)
Immature Granulocytes: 0 %
Lymphocytes Relative: 32 %
Lymphs Abs: 2.1 10*3/uL (ref 0.7–4.0)
MCH: 30.1 pg (ref 26.0–34.0)
MCHC: 32.3 g/dL (ref 30.0–36.0)
MCV: 93 fL (ref 80.0–100.0)
Monocytes Absolute: 0.6 10*3/uL (ref 0.1–1.0)
Monocytes Relative: 9 %
Neutro Abs: 3.6 10*3/uL (ref 1.7–7.7)
Neutrophils Relative %: 55 %
Platelets: 224 10*3/uL (ref 150–400)
RBC: 3.86 MIL/uL — ABNORMAL LOW (ref 3.87–5.11)
RDW: 15.9 % — ABNORMAL HIGH (ref 11.5–15.5)
WBC: 6.7 10*3/uL (ref 4.0–10.5)
nRBC: 0 % (ref 0.0–0.2)

## 2020-08-18 LAB — COMPREHENSIVE METABOLIC PANEL
ALT: 12 U/L (ref 0–44)
AST: 19 U/L (ref 15–41)
Albumin: 2.7 g/dL — ABNORMAL LOW (ref 3.5–5.0)
Alkaline Phosphatase: 67 U/L (ref 38–126)
Anion gap: 6 (ref 5–15)
BUN: 18 mg/dL (ref 8–23)
CO2: 29 mmol/L (ref 22–32)
Calcium: 9.3 mg/dL (ref 8.9–10.3)
Chloride: 102 mmol/L (ref 98–111)
Creatinine, Ser: 0.93 mg/dL (ref 0.44–1.00)
GFR, Estimated: 54 mL/min — ABNORMAL LOW (ref >60)
Glucose, Bld: 87 mg/dL (ref 70–99)
Potassium: 4.3 mmol/L (ref 3.5–5.1)
Sodium: 137 mmol/L (ref 135–145)
Total Bilirubin: 0.6 mg/dL (ref 0.3–1.2)
Total Protein: 6.1 g/dL — ABNORMAL LOW (ref 6.5–8.1)

## 2020-08-18 LAB — MAGNESIUM: Magnesium: 2.1 mg/dL (ref 1.7–2.4)

## 2020-08-18 LAB — RESP PANEL BY RT-PCR (FLU A&B, COVID) ARPGX2
Influenza A by PCR: NEGATIVE
Influenza B by PCR: NEGATIVE
SARS Coronavirus 2 by RT PCR: NEGATIVE

## 2020-08-18 LAB — PHOSPHORUS: Phosphorus: 3.8 mg/dL (ref 2.5–4.6)

## 2020-08-18 MED ORDER — HYDROCODONE-ACETAMINOPHEN 5-325 MG PO TABS: 1.0000 | ORAL_TABLET | Freq: Four times a day (QID) | ORAL | 0 refills | Status: DC | PRN

## 2020-08-18 MED ORDER — CLONAZEPAM 0.5 MG PO TABS: 0.2500 mg | ORAL_TABLET | Freq: Every day | ORAL | 0 refills | Status: DC

## 2020-08-18 NOTE — Progress Notes (Addendum)
Nutrition Follow-up  DOCUMENTATION CODES:   Underweight, Non-severe (moderate) malnutrition in context of chronic illness  INTERVENTION:   -D/c Ensure Enlive po BID, each supplement provides 350 kcal and 20 grams of protein  -Magic cup BID with meals, each supplement provides 290 kcal and 9 grams of protein  -Continue MVI with minerals daily  NUTRITION DIAGNOSIS:   Moderate Malnutrition related to chronic illness (CHF) as evidenced by mild fat depletion, moderate fat depletion, mild muscle depletion, moderate muscle depletion.  Ongoing  GOAL:   Patient will meet greater than or equal to 90% of their needs  Progressing   MONITOR:   PO intake, Supplement acceptance, Labs, Weight trends, Skin, I & O's  REASON FOR ASSESSMENT:   Consult Assessment of nutrition requirement/status, Hip fracture protocol  ASSESSMENT:   [name] is a 87 y.o. female with medical history significant of atrial fibrillation, CAD status post multiple PCI, diastolic heart failure, depression, anxiety, prior hip fracture, hypertension, hyperlipidemia, rectal cancer status post partial proctectomy, TIA who presents with hip pain following a fall at home.  Reviewed I/O's: -850 ml x 24 hours and -1.1 L since admission  Per orthopedics notes, plan for non-surgical interventions.   Pt unavailable at time of visit.   Pt remains with good oral intake. Noted meal completions 50-100%. She is refusing Ensure Enlive supplements.   Per MD notes, plan to d/c to SNF once medically stable.   Medications reviewed and include miralax, melatonin, and senokot.   Labs reviewed.    Diet Order:   Diet Order             Diet regular Room service appropriate? Yes with Assist; Fluid consistency: Thin  Diet effective now                   EDUCATION NEEDS:   Education needs have been addressed  Skin:  Skin Assessment: Reviewed RN Assessment  Last BM:  08/17/20 (50 ml output via colostomy)  Height:    Ht Readings from Last 1 Encounters:  08/12/20 5\' 3"  (1.6 m)    Weight:   Wt Readings from Last 1 Encounters:  08/12/20 42.6 kg    Ideal Body Weight:  52.3 kg  BMI:  Body mass index is 16.65 kg/m.  Estimated Nutritional Needs:   Kcal:  1250-1450  Protein:  55-70 grams  Fluid:  > 1.2 L    Loistine Chance, RD, LDN, [place] Registered Dietitian II Certified Diabetes Care and Education Specialist Please refer to [facility] for RD and/or RD on-call/weekend/after hours pager

## 2020-08-18 NOTE — Progress Notes (Signed)
Physical Therapy Treatment Patient Details Name: [name] MRN: [id number] DOB: 1916/07/16 Today's Date: 08/18/2020    History of Present Illness [name] is a 87 y.o. female admitted on 08/12/20 for hip pain following a fall at home.Patient can be weightbearing as tolerated.  No operative intervention.  Pt with medical history significant for atrial fibrillation, CAD status post multiple PCI, diastolic heart failure, depression, anxiety, prior hip fracture, hypertension, rectal cancer status post partial proctectomy (has ostomy bag), and TIA.    PT Comments    Pt was seen for gait and transfers, with cues for safety on RW and instruction for safety with hand placements.  Pt is in some pain but is able to walk with PT, with meds received from nursing to manage hip pain.  Follow along with her as her stay requires and focus on goals of PT acutely.   Follow Up Recommendations  SNF     Equipment Recommendations  None recommended by PT    Recommendations for Other Services       Precautions / Restrictions Precautions Precautions: Fall Precaution Comments: L LQ ostomy bag Restrictions Weight Bearing Restrictions: Yes RLE Weight Bearing: Weight bearing as tolerated    Mobility  Bed Mobility Overal bed mobility: Needs Assistance             General bed mobility comments: in chair    Transfers Overall transfer level: Needs assistance Equipment used: Rolling walker (2 wheeled) Transfers: Sit to/from Stand Sit to Stand: Min assist;Mod assist         General transfer comment: mod to power up and min to steady  Ambulation/Gait Ambulation/Gait assistance: Min assist Gait Distance (Feet): 35 Feet Assistive device: Rolling walker (2 wheeled) Gait Pattern/deviations: Step-to pattern;Decreased stride length;Narrow base of support;Decreased weight shift to right Gait velocity: reduced Gait velocity interpretation: <1.8 ft/sec, indicate of risk for recurrent  falls General Gait Details: walking with cues for direction and safety   Stairs             Wheelchair Mobility    Modified Rankin (Stroke Patients Only)       Balance Overall balance assessment: Needs assistance Sitting-balance support: Feet supported Sitting balance-Leahy Scale: Good     Standing balance support: Bilateral upper extremity supported Standing balance-Leahy Scale: Poor Standing balance comment: RW and cues for controlling walker to balance                            Cognition Arousal/Alertness: Awake/alert Behavior During Therapy: WFL for tasks assessed/performed Overall Cognitive Status: Within Functional Limits for tasks assessed                                        Exercises      General Comments General comments (skin integrity, edema, etc.): pt is unstable to stand and unsteady with walker unless she is cued for transitions of turns and setting up to sit      Pertinent Vitals/Pain Pain Assessment: Faces Faces Pain Scale: Hurts whole lot Pain Location: R hip Pain Descriptors / Indicators: Grimacing Pain Intervention(s): Limited activity within patient's tolerance;Monitored during session;Premedicated before session;Repositioned    Home Living                      Prior Function  PT Goals (current goals can now be found in the care plan section) Acute Rehab PT Goals Patient Stated Goal: get home Progress towards PT goals: Progressing toward goals    Frequency    Min 3X/week      PT Plan Current plan remains appropriate    Co-evaluation              AM-PAC PT "6 Clicks" Mobility   Outcome Measure  Help needed turning from your back to your side while in a flat bed without using bedrails?: A Little Help needed moving from lying on your back to sitting on the side of a flat bed without using bedrails?: A Little Help needed moving to and from a bed to a chair (including  a wheelchair)?: A Little Help needed standing up from a chair using your arms (e.g., wheelchair or bedside chair)?: A Lot Help needed to walk in hospital room?: A Little Help needed climbing 3-5 steps with a railing? : A Lot 6 Click Score: 16    End of Session Equipment Utilized During Treatment: Gait belt Activity Tolerance: Patient tolerated treatment well Patient left: with chair alarm set;in chair;with call bell/phone within reach Nurse Communication: Mobility status PT Visit Diagnosis: Muscle weakness (generalized) (M62.81);Difficulty in walking, not elsewhere classified (R26.2);Pain Pain - Right/Left: Right Pain - part of body: Hip     Time: 5597-4163 PT Time Calculation (min) (ACUTE ONLY): 29 min  Charges:  $Gait Training: 8-22 mins $Therapeutic Activity: 8-22 mins              [name] 08/18/2020, 8:25 PM  [name], PT MS Acute Rehab Dept. Number: [place] [facility] [place]

## 2020-08-18 NOTE — Progress Notes (Signed)
Report called to [employer] and given to [place], [place]. Patient awaiting PTAR at this time. AVS form printed and in d/c packet, paper script and DNR paperwork in packet to go to facility.

## 2020-08-18 NOTE — Progress Notes (Signed)
Received patient awake,alert/orientedx4 and able to verbalize needs. NAD noted; respirations easy on room air. Tele monitor on. Colostomy to LLQ. Airborne precautions in place at this time; MD aware. Movement/sensation to extremities noted. Whiteboard updated. All safety measures in place and personal belongings within reach.

## 2020-08-18 NOTE — Discharge Summary (Signed)
Physician Discharge Summary  [name] JGO:[id number] DOB: October 22, 1916 DOA: 08/12/2020  PCP: Leonard Downing, MD  Admit date: 08/12/2020 Discharge date: 08/18/2020  Admitted From: home Disposition:  SNF  Recommendations for Outpatient Follow-up:  Follow up with PCP in 1-2 weeks  Home Health: none Equipment/Devices: none  Discharge Condition: stable CODE STATUS: DNR Diet recommendation: regular  HPI: Per admitting MD, [name] is a 87 y.o. female with medical history significant of atrial fibrillation, CAD status post multiple PCI, diastolic heart failure, depression, anxiety, prior hip fracture, hypertension, hyperlipidemia, rectal cancer status post partial proctectomy, TIA who presents with hip pain following a fall at home. Patient reports having a fall at home yesterday.  She states she fell after she tripped over a comforter that she was taking off the bed.  She was unable to immediately stand but she did eventually call for help and EMS came to evaluate and subsequently brought her to the ED.  She initially denied hitting her head but had some head pain.  She states she is ambulatory at baseline with assistance of a walker. Patient's niece states that she was somewhat weaker than her baseline for the past week after having a UTI and decreased p.o. intake while she was on the antibiotic. She denies fevers, chills, chest pain, shortness of breath, abdominal pain, constipation, diarrhea, nausea, vomiting.  Hospital Course / Discharge diagnoses: Principal problem Closed fracture of right femur -Patient presented after a fall when she tripped over comforter. X-ray and CT showed of findings concerning for nondisplaced periprosthetic fracture of the right femur.  Orthopedic surgery consulted and evaluated patient, recommending nonoperative management and weightbearing as tolerated.  PT recommends SNF  Active problems History of rectal cancer -Status post partial  proctectomy about 10 years ago with colostomy in place. Hx of Atrial Flutter -Continue home Amiodarone 200 mg po Daily CAD -Status post multiple PCI including 2 bare-metal stents.  Continue home regimen Hypertension-continue home medications Chronic Diastolic CHF -Echo in March with EF 60-65%, mild hypertrophy, dilated right atria.  Appears euvolemic, continue Lasix Hyponatremia -resolved Thrombocytopenia -resolved Normocytic Anemia -stable Depression and Anxiety -stable Underweight and Non-Severe Moderate Malnutrition in the Context of Chronic illness   Of note, she had a positive COVID test on 7/5 but negative on 7/1 as well as 7/6.  She is asymptomatic, this is believed to be a false positive.  Discussed with infection prevention, no need for contact precautions/airborne  COVID-19 Labs Lab Results  Component Value Date   SARSCOV2NAA NEGATIVE 08/18/2020   SARSCOV2NAA POSITIVE (A) 08/17/2020   P[place]ak NEGATIVE 08/13/2020   [place] NEGATIVE 07/02/2020   Sepsis ruled out   Discharge Instructions   Allergies as of 08/18/2020       Reactions   Effexor [venlafaxine Hydrochloride] Nausea And Vomiting   Penicillins Shortness Of Breath   Has patient had a PCN reaction causing immediate rash, facial/tongue/throat swelling, SOB or lightheadedness with hypotension: Yes Has patient had a PCN reaction causing severe rash involving mucus membranes or skin necrosis: No Has patient had a PCN reaction that required hospitalization: No Has patient had a PCN reaction occurring within the last 10 years: No If all of the above answers are "NO", then may proceed with Cephalosporin use.   Sulfa Antibiotics Nausea Only   Dicyclomine Other (See Comments)   Caused confusion   Lactose Intolerance (gi) Diarrhea   Hydrocodone Nausea Only        Medication List     TAKE  these medications    acetaminophen 500 MG tablet Commonly known as: TYLENOL Take 2 tablets (1,000 mg total) by mouth  every 6 (six) hours as needed for mild pain.   aspirin EC 81 MG tablet Take 81 mg by mouth every morning.   beta carotene w/minerals tablet Take 1 tablet by mouth every morning.   cholecalciferol 25 MCG (1000 UNIT) tablet Commonly known as: VITAMIN D3 Take 1,000 Units by mouth every morning.   clonazePAM 0.5 MG tablet Commonly known as: KLONOPIN Take 0.5 tablets (0.25 mg total) by mouth at bedtime.   furosemide 40 MG tablet Commonly known as: LASIX Take 20 mg by mouth every morning.   HYDROcodone-acetaminophen 5-325 MG tablet Commonly known as: NORCO/VICODIN Take 1 tablet by mouth every 6 (six) hours as needed for moderate pain.   melatonin 5 MG Tabs Take 5 mg by mouth at bedtime.   nitroGLYCERIN 0.4 MG SL tablet Commonly known as: NITROSTAT Place 0.4 mg under the tongue every 5 (five) minutes as needed for chest pain.   Polyethyl Glycol-Propyl Glycol 0.4-0.3 % Gel ophthalmic gel Commonly known as: SYSTANE Place 1 drop into both eyes at bedtime.   sertraline 25 MG tablet Commonly known as: ZOLOFT Take 25 mg by mouth at bedtime.       ASK your doctor about these medications    amiodarone 200 MG tablet Commonly known as: PACERONE 1 tab (200 mg total) by mouth twice daily for 1 week followed by 1 tab (200 mg total) by mouth daily thereafter.        Consultations: Orthopedic surgery   Procedures/Studies:  DG Chest 1 View  Result Date: 08/12/2020 CLINICAL DATA:  Fall. EXAM: CHEST  1 VIEW COMPARISON:  07/31/2020.  06/29/2020. FINDINGS: Cardiomegaly. No pulmonary venous congestion. New mild atelectatic changes right mid lung. Follow-up to demonstrate clearing suggested. No pleural effusion or pneumothorax. Cardiomegaly. No pulmonary venous congestion. Stable deformity left humerus. No acute bony abnormality identified. IMPRESSION: 1. New mild atelectatic changes noted the right lung. Follow-up exam suggested demonstrate resolution. 2.  Stable cardiomegaly. 3.  Stable deformity left humerus. No acute bony abnormality identified. No pneumothorax. Electronically Signed   By: Marcello Moores  Register   On: 08/12/2020 12:25   DG Wrist 2 Views Right  Result Date: 07/31/2020 CLINICAL DATA:  Recent fall with wrist pain, initial encounter EXAM: RIGHT WRIST - 2 VIEW COMPARISON:  None. FINDINGS: Degenerative changes are noted in the radiocarpal joint as well as in the intercarpal joints. Mild cartilaginous calcification is seen. Degenerative changes in the visualized interphalangeal joints are seen. No acute fracture is noted. Mild soft tissue swelling is seen. IMPRESSION: Degenerative change without acute abnormality. Electronically Signed   By: Inez Catalina M.D.   On: 07/31/2020 22:52   CT HEAD WO CONTRAST  Result Date: 08/12/2020 CLINICAL DATA:  [place] yesterday. EXAM: CT HEAD WITHOUT CONTRAST CT CERVICAL SPINE WITHOUT CONTRAST TECHNIQUE: Multidetector CT imaging of the head and cervical spine was performed following the standard protocol without intravenous contrast. Multiplanar CT image reconstructions of the cervical spine were also generated. COMPARISON:  CT head dated July 31, 2020. CT cervical spine dated Jun 29, 2020. FINDINGS: CT HEAD FINDINGS Brain: No evidence of acute infarction, hemorrhage, hydrocephalus, extra-axial collection or mass lesion/mass effect. Stable atrophy and chronic microvascular ischemic changes. Vascular: Calcified atherosclerosis at the skullbase. No hyperdense vessel. Skull: Normal. Negative for fracture or focal lesion. Sinuses/Orbits: No acute finding. Chronic small amount of fluid in the right sphenoid sinus. Other: None.  CT CERVICAL SPINE FINDINGS Alignment: No traumatic malalignment. Unchanged mild anterolisthesis at C7-T1. Skull base and vertebrae: No acute fracture. No primary bone lesion or focal pathologic process. Soft tissues and spinal canal: No prevertebral fluid or swelling. No visible canal hematoma. Disc levels: Fused C3-C4 and C4-C5.  Unchanged severe C5-C6 and C6-C7 disc height loss. Upper chest: Unchanged biapical pleuroparenchymal scarring. Other: None. IMPRESSION: 1. No acute intracranial abnormality. 2. No acute cervical spine fracture or traumatic malalignment. Electronically Signed   By: Titus Dubin M.D.   On: 08/12/2020 13:02   CT Head Wo Contrast  Result Date: 07/31/2020 CLINICAL DATA:  Dizziness and fall.  Near syncopal episodes. EXAM: CT HEAD WITHOUT CONTRAST TECHNIQUE: Contiguous axial images were obtained from the base of the skull through the vertex without intravenous contrast. COMPARISON:  06/29/2020 FINDINGS: Brain: Diffuse cerebral atrophy. Ventricular dilatation consistent with central atrophy. Low-attenuation changes throughout the deep white matter consistent with small vessel ischemia. No mass-effect or midline shift. No abnormal extra-axial fluid collections. Gray-white matter junctions are distinct. Basal cisterns are not effaced. No acute intracranial hemorrhage. Vascular: Moderate intracranial arterial vascular calcifications are present. Skull: Calvarium appears intact. Sinuses/Orbits: Paranasal sinuses and mastoid air cells are clear. Other: No significant change since previous study. IMPRESSION: No acute intracranial abnormalities. Chronic atrophy and small vessel ischemic changes. Electronically Signed   By: Lucienne Capers M.D.   On: 07/31/2020 22:40   CT CERVICAL SPINE WO CONTRAST  Result Date: 08/12/2020 CLINICAL DATA:  [place] yesterday. EXAM: CT HEAD WITHOUT CONTRAST CT CERVICAL SPINE WITHOUT CONTRAST TECHNIQUE: Multidetector CT imaging of the head and cervical spine was performed following the standard protocol without intravenous contrast. Multiplanar CT image reconstructions of the cervical spine were also generated. COMPARISON:  CT head dated July 31, 2020. CT cervical spine dated Jun 29, 2020. FINDINGS: CT HEAD FINDINGS Brain: No evidence of acute infarction, hemorrhage, hydrocephalus, extra-axial  collection or mass lesion/mass effect. Stable atrophy and chronic microvascular ischemic changes. Vascular: Calcified atherosclerosis at the skullbase. No hyperdense vessel. Skull: Normal. Negative for fracture or focal lesion. Sinuses/Orbits: No acute finding. Chronic small amount of fluid in the right sphenoid sinus. Other: None. CT CERVICAL SPINE FINDINGS Alignment: No traumatic malalignment. Unchanged mild anterolisthesis at C7-T1. Skull base and vertebrae: No acute fracture. No primary bone lesion or focal pathologic process. Soft tissues and spinal canal: No prevertebral fluid or swelling. No visible canal hematoma. Disc levels: Fused C3-C4 and C4-C5. Unchanged severe C5-C6 and C6-C7 disc height loss. Upper chest: Unchanged biapical pleuroparenchymal scarring. Other: None. IMPRESSION: 1. No acute intracranial abnormality. 2. No acute cervical spine fracture or traumatic malalignment. Electronically Signed   By: Titus Dubin M.D.   On: 08/12/2020 13:02   CT Hip Right Wo Contrast  Result Date: 08/12/2020 CLINICAL DATA:  Right hip pain after a fall yesterday. Initial encounter. EXAM: CT OF THE RIGHT HIP WITHOUT CONTRAST TECHNIQUE: Multidetector CT imaging of the right hip was performed according to the standard protocol. Multiplanar CT image reconstructions were also generated. COMPARISON:  Plain films right hip today and 05/24/2020. Plain films left hip and pelvis 09/06/2019. CT abdomen and pelvis 05/02/2020. FINDINGS: Bones/Joint/Cartilage Streak artifact from the patient's hip arthroplasty limits evaluation. There is buckling of the anterior cortex of the proximal diaphysis of the femur as seen on images 2428 of series 9 worrisome for fracture. No other finding to suggest acute fracture is identified. Bones are osteopenic. Remote left pubic rami fractures are noted were present on the 2021 study. Ligaments  Suboptimally assessed by CT. Muscles and Tendons Appear normal. Soft tissues Imaged intrapelvic  contents demonstrate no acute or focal abnormality. IMPRESSION: Limited examination due to streak artifact from the patient's hip arthroplasty. Buckling of the anterior cortex of the proximal diaphysis of the femur worrisome for nondisplaced fracture. No other evidence of acute abnormality. Remote left pubic rami fractures. Electronically Signed   By: Inge Rise M.D.   On: 08/12/2020 15:49   DG Chest Portable 1 View  Result Date: 07/31/2020 CLINICAL DATA:  Recent syncopal episode with fall, initial encounter EXAM: PORTABLE CHEST 1 VIEW COMPARISON:  06/29/2020 FINDINGS: Cardiac shadow is mildly enlarged but stable. Aortic calcifications are seen. The lungs are clear bilaterally. No acute bony abnormality is seen. IMPRESSION: No acute abnormality noted. Electronically Signed   By: Inez Catalina M.D.   On: 07/31/2020 22:47   DG Hip Unilat With Pelvis 2-3 Views Right  Result Date: 08/12/2020 CLINICAL DATA:  Trip and fall with right hip pain, initial encounter EXAM: DG HIP (WITH OR WITHOUT PELVIS) 2-3V RIGHT COMPARISON:  05/24/2020 FINDINGS: Right hip replacement is noted. The prosthesis appears well seated. Prior left pubic rami fractures are again noted. Subtle cortical irregularity is noted along the medial shaft of the femur adjacent to the prosthesis. Undisplaced fracture could not be totally excluded. IMPRESSION: Mild cortical irregularity along the medial aspect of the proximal femur adjacent to the prosthesis. This may represent an undisplaced fracture. CT may be helpful as clinically indicated. Healing left pubic rami fractures. Electronically Signed   By: Inez Catalina M.D.   On: 08/12/2020 12:25     Subjective: - no chest pain, shortness of breath, no abdominal pain, nausea or vomiting.   Discharge Exam: BP (!) 151/68 (BP Location: Left Arm)   Pulse 63   Temp 98 F (36.7 C) (Oral)   Resp 17   Ht 5\' 3"  (1.6 m)   Wt 42.6 kg   SpO2 96%   BMI 16.65 kg/m   General: Pt is alert, awake,  not in acute distress Cardiovascular: RRR, S1/S2 +, no rubs, no gallops Respiratory: CTA bilaterally, no wheezing, no rhonchi Abdominal: Soft, NT, ND, bowel sounds + Extremities: no edema, no cyanosis    The results of significant diagnostics from this hospitalization (including imaging, microbiology, ancillary and laboratory) are listed below for reference.     Microbiology: Recent Results (from the past 240 hour(s))  SARS CORONAVIRUS 2 (TAT 6-24 HRS) Nasopharyngeal Nasopharyngeal Swab     Status: None   Collection Time: 08/13/20 12:59 PM   Specimen: Nasopharyngeal Swab  Result Value Ref Range Status   SARS Coronavirus 2 NEGATIVE NEGATIVE Final    Comment: (NOTE) SARS-CoV-2 target nucleic acids are NOT DETECTED.  The SARS-CoV-2 RNA is generally detectable in upper and lower respiratory specimens during the acute phase of infection. Negative results do not preclude SARS-CoV-2 infection, do not rule out co-infections with other pathogens, and should not be used as the sole basis for treatment or other patient management decisions. Negative results must be combined with clinical observations, patient history, and epidemiological information. The expected result is Negative.  Fact Sheet for Patients: SugarRoll.be  Fact Sheet for Healthcare Providers: https://www.woods-mathews.com/  This test is not yet approved or cleared by the [place] FDA and  has been authorized for detection and/or diagnosis of SARS-CoV-2 by FDA under an Emergency Use Authorization (EUA). This EUA will remain  in effect (meaning this test can be used) for the duration of the COVID-19 declaration  under Se ction 564(b)(1) of the Act, 21 U.S.C. section 360bbb-3(b)(1), unless the authorization is terminated or revoked sooner.  Performed at [facility], [place] [address], [place], [place] [address]   SARS CORONAVIRUS 2 (TAT 6-24 HRS) Nasopharyngeal  Nasopharyngeal Swab     Status: Abnormal   Collection Time: 08/17/20  2:26 PM   Specimen: Nasopharyngeal Swab  Result Value Ref Range Status   SARS Coronavirus 2 POSITIVE (A) NEGATIVE Final    Comment: (NOTE) SARS-CoV-2 target nucleic acids are DETECTED.  The SARS-CoV-2 RNA is generally detectable in upper and lower respiratory specimens during the acute phase of infection. Positive results are indicative of the presence of SARS-CoV-2 RNA. Clinical correlation with patient history and other diagnostic information is  necessary to determine patient infection status. Positive results do not rule out bacterial infection or co-infection with other viruses.  The expected result is Negative.  Fact Sheet for Patients: SugarRoll.be  Fact Sheet for Healthcare Providers: https://www.woods-mathews.com/  This test is not yet approved or cleared by the [place] FDA and  has been authorized for detection and/or diagnosis of SARS-CoV-2 by FDA under an Emergency Use Authorization (EUA). This EUA will remain  in effect (meaning this test can be used) for the duration of the COVID-19 declaration under Section 564(b)(1) of the Act, 21 U. S.C. section 360bbb-3(b)(1), unless the authorization is terminated or revoked sooner.   Performed at [facility], [place] [address], [place], [place] [address]   Resp Panel by RT-PCR (Flu A&B, Covid) Nasopharyngeal Swab     Status: None   Collection Time: 08/18/20  9:39 AM   Specimen: Nasopharyngeal Swab; Nasopharyngeal(NP) swabs in vial transport medium  Result Value Ref Range Status   SARS Coronavirus 2 by RT PCR NEGATIVE NEGATIVE Final    Comment: (NOTE) SARS-CoV-2 target nucleic acids are NOT DETECTED.  The SARS-CoV-2 RNA is generally detectable in upper respiratory specimens during the acute phase of infection. The lowest concentration of SARS-CoV-2 viral copies this assay can detect is 138 copies/mL. A  negative result does not preclude SARS-Cov-2 infection and should not be used as the sole basis for treatment or other patient management decisions. A negative result may occur with  improper specimen collection/handling, submission of specimen other than nasopharyngeal swab, presence of viral mutation(s) within the areas targeted by this assay, and inadequate number of viral copies(<138 copies/mL). A negative result must be combined with clinical observations, patient history, and epidemiological information. The expected result is Negative.  Fact Sheet for Patients:  EntrepreneurPulse.com.au  Fact Sheet for Healthcare Providers:  IncredibleEmployment.be  This test is no t yet approved or cleared by the [place] FDA and  has been authorized for detection and/or diagnosis of SARS-CoV-2 by FDA under an Emergency Use Authorization (EUA). This EUA will remain  in effect (meaning this test can be used) for the duration of the COVID-19 declaration under Section 564(b)(1) of the Act, 21 U.S.C.section 360bbb-3(b)(1), unless the authorization is terminated  or revoked sooner.       Influenza A by PCR NEGATIVE NEGATIVE Final   Influenza B by PCR NEGATIVE NEGATIVE Final    Comment: (NOTE) The Xpert Xpress SARS-CoV-2/FLU/RSV plus assay is intended as an aid in the diagnosis of influenza from Nasopharyngeal swab specimens and should not be used as a sole basis for treatment. Nasal washings and aspirates are unacceptable for Xpert Xpress SARS-CoV-2/FLU/RSV testing.  Fact Sheet for Patients: EntrepreneurPulse.com.au  Fact Sheet for Healthcare Providers: IncredibleEmployment.be  This test is not yet approved or cleared by the [place] and has been authorized for detection and/or diagnosis of SARS-CoV-2 by FDA under an Emergency Use Authorization (EUA). This EUA will remain in effect (meaning this test can  be used) for the duration of the COVID-19 declaration under Section 564(b)(1) of the Act, 21 U.S.C. section 360bbb-3(b)(1), unless the authorization is terminated or revoked.  Performed at [facility], [place] [address] [place], [place] [address]      Labs: Basic Metabolic Panel: Recent Labs  Lab 08/13/20 1035 08/14/20 0141 08/15/20 0131 08/16/20 0101 08/18/20 0337  NA 136 134* 137 135 137  K 4.2 4.1 4.3 4.9 4.3  CL 102 98 100 99 102  CO2 27 30 30 31 29   GLUCOSE 119* 93 105* 91 87  BUN 15 15 21 23 18   CREATININE 0.89 0.83 0.91 0.86 0.93  CALCIUM 9.3 9.2 9.2 9.2 9.3  MG 1.8 1.8 1.8 1.9 2.1  PHOS 2.9 3.0 3.7 3.6 3.8   Liver Function Tests: Recent Labs  Lab 08/13/20 1035 08/14/20 0141 08/15/20 0131 08/16/20 0101 08/18/20 0337  AST 23 18 18 19 19   ALT 14 13 14 12 12   ALKPHOS 65 60 65 63 67  BILITOT 0.7 0.7 0.5 0.4 0.6  PROT 6.2* 5.8* 6.0* 6.1* 6.1*  ALBUMIN 2.9* 2.7* 2.7* 2.6* 2.7*   CBC: Recent Labs  Lab 08/13/20 1035 08/14/20 0141 08/15/20 0131 08/16/20 0101 08/18/20 0337  WBC 7.1 7.1 8.7 8.2 6.7  NEUTROABS 4.6 4.1 5.5 4.5 3.6  HGB 11.3* 11.0* 12.2 11.9* 11.6*  HCT 35.4* 33.5* 36.6 36.4 35.9*  MCV 96.5 93.6 92.4 94.1 93.0  PLT 147* 148* 179 187 224   CBG: No results for input(s): GLUCAP in the last 168 hours. Hgb A1c No results for input(s): HGBA1C in the last 72 hours. Lipid Profile No results for input(s): CHOL, HDL, LDLCALC, TRIG, CHOLHDL, LDLDIRECT in the last 72 hours. Thyroid function studies No results for input(s): TSH, T4TOTAL, T3FREE, THYROIDAB in the last 72 hours.  Invalid input(s): FREET3 Urinalysis    Component Value Date/Time   COLORURINE YELLOW 07/31/2020 [place] 07/31/2020 2255   LABSPEC 1.009 07/31/2020 2255   PHURINE 6.0 07/31/2020 2255   GLUCOSEU NEGATIVE 07/31/2020 2255   HGBUR NEGATIVE 07/31/2020 2255   BILIRUBINUR NEGATIVE 07/31/2020 2255   [place] 07/31/2020 2255   PROTEINUR  NEGATIVE 07/31/2020 2255   UROBILINOGEN 0.2 10/01/2014 1628   NITRITE NEGATIVE 07/31/2020 2255   LEUKOCYTESUR LARGE (A) 07/31/2020 2255    FURTHER DISCHARGE INSTRUCTIONS:   Get Medicines reviewed and adjusted: Please take all your medications with you for your next visit with your Primary MD   Laboratory/radiological data: Please request your Primary MD to go over all hospital tests and procedure/radiological results at the follow up, please ask your Primary MD to get all Hospital records sent to his/her office.   In some cases, they will be blood work, cultures and biopsy results pending at the time of your discharge. Please request that your primary care M.D. goes through all the records of your hospital data and follows up on these results.   Also Note the following: If you experience worsening of your admission symptoms, develop shortness of breath, life threatening emergency, suicidal or homicidal thoughts you must seek medical attention immediately by calling 911 or calling your MD immediately  if symptoms less severe.   You must read complete instructions/literature along with all the  possible adverse reactions/side effects for all the Medicines you take and that have been prescribed to you. Take any new Medicines after you have completely understood and accpet all the possible adverse reactions/side effects.    Do not drive when taking Pain medications or sleeping medications (Benzodaizepines)   Do not take more than prescribed Pain, Sleep and Anxiety Medications. It is not advisable to combine anxiety,sleep and pain medications without talking with your primary care practitioner   Special Instructions: If you have smoked or chewed Tobacco  in the last 2 yrs please stop smoking, stop any regular Alcohol  and or any Recreational drug use.   Wear Seat belts while driving.   Please note: You were cared for by a hospitalist during your hospital stay. Once you are discharged, your  primary care physician will handle any further medical issues. Please note that NO REFILLS for any discharge medications will be authorized once you are discharged, as it is imperative that you return to your primary care physician (or establish a relationship with a primary care physician if you do not have one) for your post hospital discharge needs so that they can reassess your need for medications and monitor your lab values.  Time coordinating discharge: 40 minutes  SIGNED:  Marzetta Board, MD, PhD 08/18/2020, 12:03 PM

## 2020-08-18 NOTE — TOC Transition Note (Signed)
Transition of Care [facility]) - CM/SW Discharge Note   Patient Details  Name: [name] MRN: [id number] Date of Birth: 14-Nov-1916  Transition of Care [facility]) CM/SW Contact:  Bethann Berks[place], [place] Phone Number: 08/18/2020, 1:29 PM   Clinical Narrative:     Patient will DC to: [facility] Anticipated DC date: 08/18/20 Family notified: Left message with Marlou Porch Daughter     [phone]   Called and notified Essie Christine Niece     [phone]     Transport LE:[id number]   Per MD patient ready for DC to [facility]. RN, patient, patient's family, and facility notified of DC. Discharge Summary and FL2 sent to facility. RN to call report prior to discharge [phone]). DC packet on chart. Ambulance transport requested for patient.   CSW will sign off for now as social work intervention is no longer needed. Please consult [place] again if new needs arise.   Final next level of care: Skilled Nursing Facility Barriers to Discharge: No Barriers Identified   Patient Goals and CMS Choice        Discharge Placement              Patient chooses bed at: [facility] Patient to be transferred to facility by: [place] Name of family member notified: Marlou Porch Daughter     [phone]   and  Essie Christine Niece     [phone] Patient and family notified of of transfer: 08/18/20  Discharge Plan and Services                                     Social Determinants of Health ([place]) Interventions     Readmission Risk Interventions Readmission Risk Prevention Plan 05/04/2020  Transportation Screening Complete  PCP or Specialist Appt within 5-7 Days Complete  Home Care Screening Complete  Medication Review (RN CM) Complete  Some recent data might be hidden

## 2020-08-18 NOTE — [place] Notes (Signed)
Attempt to call report to [facility] and no answer at this time. Will retry shortly.

## 2020-09-25 ENCOUNTER — Other Ambulatory Visit (HOSPITAL_COMMUNITY): Payer: Self-pay

## 2020-10-03 ENCOUNTER — Emergency Department (HOSPITAL_COMMUNITY)
Admission: EM | Admit: 2020-10-03 | Discharge: 2020-10-03 | Disposition: A | Discharge status: Home / Self Care | Payer: Medicare PPO | Attending: Emergency Medicine | Admitting: Emergency Medicine

## 2020-10-03 ENCOUNTER — Other Ambulatory Visit: Payer: Self-pay

## 2020-10-03 ENCOUNTER — Emergency Department (HOSPITAL_COMMUNITY): Payer: Medicare PPO

## 2020-10-03 DIAGNOSIS — Z20822 Contact with and (suspected) exposure to covid-19: Secondary | ICD-10-CM | POA: Diagnosis not present

## 2020-10-03 DIAGNOSIS — Z79899 Other long term (current) drug therapy: Secondary | ICD-10-CM | POA: Insufficient documentation

## 2020-10-03 DIAGNOSIS — R112 Nausea with vomiting, unspecified: Secondary | ICD-10-CM | POA: Insufficient documentation

## 2020-10-03 DIAGNOSIS — I2510 Atherosclerotic heart disease of native coronary artery without angina pectoris: Secondary | ICD-10-CM | POA: Diagnosis not present

## 2020-10-03 DIAGNOSIS — I5032 Chronic diastolic (congestive) heart failure: Secondary | ICD-10-CM | POA: Diagnosis not present

## 2020-10-03 DIAGNOSIS — Z96641 Presence of right artificial hip joint: Secondary | ICD-10-CM | POA: Diagnosis not present

## 2020-10-03 DIAGNOSIS — I110 Hypertensive heart disease with heart failure: Secondary | ICD-10-CM | POA: Insufficient documentation

## 2020-10-03 DIAGNOSIS — Z7982 Long term (current) use of aspirin: Secondary | ICD-10-CM | POA: Insufficient documentation

## 2020-10-03 DIAGNOSIS — E860 Dehydration: Secondary | ICD-10-CM | POA: Diagnosis not present

## 2020-10-03 DIAGNOSIS — Z85048 Personal history of other malignant neoplasm of rectum, rectosigmoid junction, and anus: Secondary | ICD-10-CM | POA: Diagnosis not present

## 2020-10-03 LAB — CBC WITH DIFFERENTIAL/PLATELET
Abs Immature Granulocytes: 0.04 10*3/uL (ref 0.00–0.07)
Basophils Absolute: 0 10*3/uL (ref 0.0–0.1)
Basophils Relative: 0 %
Eosinophils Absolute: 0 10*3/uL (ref 0.0–0.5)
Eosinophils Relative: 0 %
HCT: 36.8 % (ref 36.0–46.0)
Hemoglobin: 11.7 g/dL — ABNORMAL LOW (ref 12.0–15.0)
Immature Granulocytes: 1 %
Lymphocytes Relative: 15 %
Lymphs Abs: 0.9 10*3/uL (ref 0.7–4.0)
MCH: 31 pg (ref 26.0–34.0)
MCHC: 31.8 g/dL (ref 30.0–36.0)
MCV: 97.6 fL (ref 80.0–100.0)
Monocytes Absolute: 0.2 10*3/uL (ref 0.1–1.0)
Monocytes Relative: 4 %
Neutro Abs: 4.8 10*3/uL (ref 1.7–7.7)
Neutrophils Relative %: 80 %
Platelets: 266 10*3/uL (ref 150–400)
RBC: 3.77 MIL/uL — ABNORMAL LOW (ref 3.87–5.11)
RDW: 14.5 % (ref 11.5–15.5)
WBC: 6 10*3/uL (ref 4.0–10.5)
nRBC: 0 % (ref 0.0–0.2)

## 2020-10-03 LAB — COMPREHENSIVE METABOLIC PANEL
ALT: 13 U/L (ref 0–44)
AST: 19 U/L (ref 15–41)
Albumin: 2.8 g/dL — ABNORMAL LOW (ref 3.5–5.0)
Alkaline Phosphatase: 88 U/L (ref 38–126)
Anion gap: 8 (ref 5–15)
BUN: 16 mg/dL (ref 8–23)
CO2: 26 mmol/L (ref 22–32)
Calcium: 9.2 mg/dL (ref 8.9–10.3)
Chloride: 104 mmol/L (ref 98–111)
Creatinine, Ser: 0.97 mg/dL (ref 0.44–1.00)
GFR, Estimated: 52 mL/min — ABNORMAL LOW (ref >60)
Glucose, Bld: 133 mg/dL — ABNORMAL HIGH (ref 70–99)
Potassium: 4.3 mmol/L (ref 3.5–5.1)
Sodium: 138 mmol/L (ref 135–145)
Total Bilirubin: 0.7 mg/dL (ref 0.3–1.2)
Total Protein: 6.7 g/dL (ref 6.5–8.1)

## 2020-10-03 LAB — URINALYSIS, ROUTINE W REFLEX MICROSCOPIC
Bilirubin Urine: NEGATIVE
Glucose, UA: NEGATIVE mg/dL
Hgb urine dipstick: NEGATIVE
Ketones, ur: 5 mg/dL — AB
Leukocytes,Ua: NEGATIVE
Nitrite: NEGATIVE
Protein, ur: NEGATIVE mg/dL
Specific Gravity, Urine: 1.016 (ref 1.005–1.030)
pH: 5 (ref 5.0–8.0)

## 2020-10-03 LAB — TROPONIN I (HIGH SENSITIVITY)
Troponin I (High Sensitivity): 12 ng/L (ref <18)
Troponin I (High Sensitivity): 10 ng/L (ref <18)

## 2020-10-03 LAB — RESP PANEL BY RT-PCR (FLU A&B, COVID) ARPGX2
Influenza A by PCR: NEGATIVE
Influenza B by PCR: NEGATIVE
SARS Coronavirus 2 by RT PCR: NEGATIVE

## 2020-10-03 LAB — LIPASE, BLOOD: Lipase: 26 U/L (ref 11–51)

## 2020-10-03 MED ORDER — SODIUM CHLORIDE 0.9 % IV BOLUS
500.0000 mL | Freq: Once | INTRAVENOUS | Status: AC
  Administered 2020-10-03: 500 mL via INTRAVENOUS

## 2020-10-03 MED ORDER — SODIUM CHLORIDE 0.9 % IV SOLN
12.5000 mg | Freq: Four times a day (QID) | INTRAVENOUS | Status: DC | PRN
  Administered 2020-10-03: 12.5 mg via INTRAVENOUS
  Filled 2020-10-03: qty 0.5

## 2020-10-03 MED ORDER — ONDANSETRON 8 MG PO TBDP: 8.0000 mg | ORAL_TABLET | Freq: Three times a day (TID) | ORAL | 0 refills | Status: DC | PRN

## 2020-10-03 MED ORDER — IOHEXOL 300 MG/ML  SOLN
70.0000 mL | Freq: Once | INTRAMUSCULAR | Status: AC | PRN
  Administered 2020-10-03: 70 mL via INTRAVENOUS

## 2020-10-03 NOTE — ED Provider Notes (Addendum)
  Physical Exam  BP (!) 148/51   Pulse (!) 54   Temp 97.9 F (36.6 C) (Oral)   Resp 13   Ht '5\' 3"'$  (1.6 m)   Wt 43.1 kg   SpO2 94%   BMI 16.83 kg/m   Physical Exam  ED Course/Procedures     Procedures  MDM   Patient comes to the ER with chief complaint of nausea. I am assuming care from Dr. Gilford Raid.  Patient is 87 year old, recently treated for UTI.  Resides at a skilled nursing facility.  Complaining of worsening nausea and vomiting.  CT scan has been ordered.  If CT is negative, oral challenge is completed without difficulty, patient can be discharged.  Labs are reassuring.  Troponin is also within normal limits.    Varney Biles, MD 10/03/20 1603  5:08 PM CT is neg.  PO challenge passed. Ambulating. Feels better than this AM but not back to baseline. Resides in Assisted living. Family supportive, will monitor closely.  Strict ER return precautions have been discussed, and patient is agreeing with the plan and is comfortable with the workup done and the recommendations from the ER.  6:05 PM Ambulated - at baseline per family. Stable for d/c.    Varney Biles, MD 10/03/20 [place], Jennalee Greaves, MD 10/03/20 1806

## 2020-10-03 NOTE — ED Provider Notes (Signed)
[facility] EMERGENCY DEPARTMENT Provider Note   CSN: [id number] Arrival date & time: 10/03/20  1035     History Chief Complaint  Patient presents with   Nausea     [name] is a 87 y.o. female.  Pt presents to the ED today with n/v.  Pt is from a SNF.  She has had a recent uti and has experienced nausea with that.  She just finished a course of abx.  This morning, the nausea was much worse.  EMS gave pt 8 mg of zofran en route and she still feels very nauseous.  Pt denies any pain.  She has a colostomy with good output.  No fevers.       Past Medical History:  Diagnosis Date   Arthritis    FINGERS    CAD (coronary artery disease) CARDIOLOGIST-- DR Angelena Form   a. s/p INF STEMI 7/12: tx with BMS to RCA;  b. cath 08/26/10: pLAD 30%, mLAD 50%, D1 40%, pCFX 95%, mRCA occluded;   c. staged PCI of pCFX with BMS;   d. echo 7/12:   EF 60-65%, mild RAE, mild to moderate AI, mild MR, moderate TR, RVE, PASP 47   Complication of anesthesia PT STATES "MADE HER FEEL CRAZY"   Degeneration of eye    left eye cornea   Dyspnea    First degree heart block    Heart palpitations PAC'S AND SVT RUN'S  PER CARDIOLOGIST NOTE   History of ST elevation myocardial infarction (STEMI) 08-26-2010-- INFERIOR WALL   S/P PCI  BMS IN RCA AND PROX. CX   Hyperlipidemia    Hypertension    Impaired hearing BILATERAL HEARING AIDS   Osteopenia    PAF (paroxysmal atrial fibrillation) (HCC)    PONV (postoperative nausea and vomiting)    Pulmonary nodules BENIGN  PER CT  10-12-2010   Rectal Cancer 08/2010   adenocarcinoma   S/P PARTIAL PROCTECTOMY (NO CHEMO OR RADIATION)   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 01/11/2011   S/P colostomy ([place]) SECONDARY TO RECTOVAGINAL FISTULA   S/P coronary artery stent placement 08/2010   X2  BM    Patient Active Problem List   Diagnosis Date Noted   Malnutrition of moderate degree 08/14/2020   Hip pain 08/13/2020   Closed fracture  of right femur ([place]) 08/12/2020   Syncope 06/29/2020   Atrial fibrillation with RVR ([place]) 05/03/2020   Other cirrhosis of liver ([place]) 05/01/2020   Paroxysmal atrial fibrillation with rapid ventricular response (HCC) 04/30/2020   Chronic diastolic CHF (congestive heart failure) ([place]) 04/30/2020   Elevated LFTs 04/30/2020   AKI (acute kidney injury) ([place]) 04/30/2020   Atrial flutter ([place]) 04/27/2020   Secondary hypercoagulable state ([place]) 04/27/2020   Closed fracture of multiple pubic rami, left, initial encounter ([place]) 09/06/2019   Slurred speech 02/23/2017   PAF (paroxysmal atrial fibrillation) ([place]) 02/23/2017   Depression with anxiety 02/23/2017   TIA (transient ischemic attack)    Fracture, tibia and fibula 07/06/2015   Tibia/fibula fracture 07/03/2015   Lung nodule seen on imaging study 09/05/2013   Exposure to TB 09/05/2013   SOB (shortness of breath) 10/02/2011   Vascular skin changes 07/17/2011   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 01/11/2011   Anorexia post-op 12/08/2010   HTN (hypertension) 11/03/2010   Dizziness 09/15/2010   CAD (coronary artery disease)    Hyperlipidemia    Rectal cancer, pT2uN0(pNX) s/p TEM partial proctectomy 09/07/2010    Past  Surgical History:  Procedure Laterality Date   ABDOMINAL HYSTERECTOMY  1950's   AND APPENDECTOMY   CATARACT EXTRACTION W/ INTRAOCULAR LENS  IMPLANT, BILATERAL     CORONARY ANGIOPLASTY WITH STENT PLACEMENT  08-26-2010  DR [place]ley   PCI, BM STENT IN RCA   CORONARY ANGIOPLASTY WITH STENT PLACEMENT  08-29-2010  DR [place]   PCI, BM STENT IN PROXIMAL CIRCUMFLEX   EXCISION BENIGN CYST RIGHT BREAST     FISTULA PLUG N/A 06/21/2012   Procedure: insertion of FISTULA PLUG;  Surgeon: Leighton Ruff, MD;  Location: [facility];  Service: General;  Laterality: N/A;   FLEXIBLE SIGMOIDOSCOPY N/A 04/02/2012   Procedure: FLEXIBLE SIGMOIDOSCOPY;  Surgeon: Leighton Ruff, MD;  Location: [facility];  Service:  Endoscopy;  Laterality: N/A;   KNEE SURGERY Right 1996   LAPROSCOPY LYSIS ADHESIONS/ DRAINAGE OF PELVIC ABSCESS/ DIVERTING LOOP SIGMOID COLECTOMY  11-22-2010   POST OP RECTOVAGINAL FISTULA   PARTIAL PROCTECTOMY BY TEM  11-17-2010   RECTAL CANCER   RELEASE LEFT CARPAL TUNNEL/ OSTEOTOMY LEFT DISTAL RADIUS  11-10-2009   STAPEDECTOMY  1970'S   TONSILLECTOMY  CHILD   TOTAL HIP ARTHROPLASTY Right 1992   TRANSTHORACIC ECHOCARDIOGRAM  10-10-2011   NORMAL LV SIZE WITH MILD FOCAL BASAL SEPTAL HYPERTROPHY/ EF 55-60%/ NORMAL RV SIZE AND LVSF/ BIATRIAL ENLARGEMENT/ MILD TO MODERATE AI  &  TR   TYMPANOPLASTY Left 12-23-2009     OB History   No obstetric history on file.     Family History  Problem Relation Age of Onset   Kidney disease Mother    Cancer Brother     Social History   Tobacco Use   Smoking status: Never   Smokeless tobacco: Never  Substance Use Topics   Alcohol use: No   Drug use: No    Home Medications Prior to Admission medications   Medication Sig Start Date End Date Taking? Authorizing Provider  acetaminophen (TYLENOL) 500 MG tablet Take 2 tablets (1,000 mg total) by mouth every 6 (six) hours as needed for mild pain. 07/06/15  Yes Rama, Venetia Maxon, MD  amiodarone (PACERONE) 200 MG tablet 1 tab (200 mg total) by mouth twice daily for 1 week followed by 1 tab (200 mg total) by mouth daily thereafter. Patient taking differently: Take 200 mg by mouth daily. 06/25/20  Yes Burnell Blanks, MD  aspirin EC 81 MG tablet Take 81 mg by mouth every morning.   Yes [provider]  cholecalciferol (VITAMIN D3) 25 MCG (1000 UNIT) tablet Take 1,000 Units by mouth every morning.   Yes [provider]  clonazePAM (KLONOPIN) 0.5 MG tablet Take 0.5 tablets (0.25 mg total) by mouth at bedtime. 08/18/20  Yes Caren Griffins, MD  furosemide (LASIX) 40 MG tablet Take 20 mg by mouth every morning.   Yes [provider]  levothyroxine (SYNTHROID) 25 MCG tablet  Take 25 mcg by mouth daily. 09/09/20  Yes [provider]  melatonin 5 MG TABS Take 5 mg by mouth at bedtime.   Yes [provider]  nitroGLYCERIN (NITROSTAT) 0.4 MG SL tablet Place 0.4 mg under the tongue every 5 (five) minutes as needed for chest pain.   Yes [provider]  Polyethyl Glycol-Propyl Glycol (SYSTANE) 0.4-0.3 % GEL ophthalmic gel Place 1 drop into both eyes at bedtime.    Yes [provider]  sertraline (ZOLOFT) 25 MG tablet Take 25 mg by mouth at bedtime. 10/28/19  Yes [provider]  beta carotene w/minerals (  OCUVITE) tablet Take 1 tablet by mouth every morning.    [provider]  HYDROcodone-acetaminophen (NORCO/VICODIN) 5-325 MG tablet Take 1 tablet by mouth every 6 (six) hours as needed for moderate pain. Patient not taking: Reported on 10/03/2020 08/18/20   Caren Griffins, MD  nitrofurantoin, macrocrystal-monohydrate, (MACROBID) 100 MG capsule Take 100 mg by mouth 2 (two) times daily. 09/22/20   [provider]    Allergies    Effexor [venlafaxine hydrochloride], Penicillins, Sulfa antibiotics, Dicyclomine, Lactose intolerance (gi), and Hydrocodone  Review of Systems   Review of Systems  Gastrointestinal:  Positive for nausea and vomiting.  All other systems reviewed and are negative.  Physical Exam Updated Vital Signs BP (!) 132/45   Pulse (!) 51   Temp 97.9 F (36.6 C) (Oral)   Resp 16   Ht '5\' 3"'$  (1.6 m)   Wt 43.1 kg   SpO2 (S) 93% Comment: Titrated to room air  BMI 16.83 kg/m   Physical Exam Vitals and nursing note reviewed.  Constitutional:      Appearance: Normal appearance. She is ill-appearing.  HENT:     Head: Normocephalic and atraumatic.     Right Ear: External ear normal.     Left Ear: External ear normal.     Nose: Nose normal.     Mouth/Throat:     Mouth: Mucous membranes are dry.  Eyes:     Extraocular Movements: Extraocular movements intact.     Conjunctiva/sclera:  Conjunctivae normal.     Pupils: Pupils are equal, round, and reactive to light.  Cardiovascular:     Rate and Rhythm: Normal rate and regular rhythm.     Pulses: Normal pulses.     Heart sounds: Normal heart sounds.  Pulmonary:     Effort: Pulmonary effort is normal.     Breath sounds: Normal breath sounds.  Abdominal:     General: Abdomen is flat. Bowel sounds are normal.     Palpations: Abdomen is soft.     Comments: + colostomy with stool and gas  Musculoskeletal:        General: Normal range of motion.     Cervical back: Normal range of motion and neck supple.  Skin:    General: Skin is warm.     Capillary Refill: Capillary refill takes less than 2 seconds.  Neurological:     General: No focal deficit present.     Mental Status: She is alert and oriented to person, place, and time.  Psychiatric:        Mood and Affect: Mood normal.        Behavior: Behavior normal.        Thought Content: Thought content normal.        Judgment: Judgment normal.    ED Results / Procedures / Treatments   Labs (all labs ordered are listed, but only abnormal results are displayed) Labs Reviewed  CBC WITH DIFFERENTIAL/PLATELET - Abnormal; Notable for the following components:      Result Value   RBC 3.77 (*)    Hemoglobin 11.7 (*)    All other components within normal limits  COMPREHENSIVE METABOLIC PANEL - Abnormal; Notable for the following components:   Glucose, Bld 133 (*)    Albumin 2.8 (*)    GFR, Estimated 52 (*)    All other components within normal limits  URINALYSIS, ROUTINE W REFLEX MICROSCOPIC - Abnormal; Notable for the following components:   APPearance HAZY (*)    Ketones, ur 5 (*)  All other components within normal limits  RESP PANEL BY RT-PCR (FLU A&B, COVID) ARPGX2  LIPASE, BLOOD  TROPONIN I (HIGH SENSITIVITY)  TROPONIN I (HIGH SENSITIVITY)    EKG EKG Interpretation  Date/Time:  Sunday October 03 2020 10:55:38 EDT Ventricular Rate:  63 PR  Interval:  221 QRS Duration: 155 QT Interval:  493 QTC Calculation: 505 R Axis:   -46 Text Interpretation: Sinus or ectopic atrial rhythm Prolonged PR interval Nonspecific IVCD with LAD LVH with secondary repolarization abnormality Anterior Q waves, possibly due to LVH Baseline wander in lead(s) V2 V3 No significant change since last tracing Confirmed by Isla Pence [phone]) on 10/03/2020 11:06:24 AM  Radiology No results found.  Procedures Procedures   Medications Ordered in ED Medications  promethazine (PHENERGAN) 12.5 mg in sodium chloride 0.9 % 50 mL IVPB (0 mg Intravenous Stopped 10/03/20 1140)  sodium chloride 0.9 % bolus 500 mL (0 mLs Intravenous Stopped 10/03/20 1126)    ED Course  I have reviewed the triage vital signs and the nursing notes.  Pertinent labs & imaging results that were available during my care of the patient were reviewed by me and considered in my medical decision making (see chart for details).    MDM Rules/Calculators/A&P                           Pt has not thrown up any more since arrival.  CT abd/pelvis pending at shift change.  Pt signed out to Dr. Kathrynn Humble. Final Clinical Impression(s) / ED Diagnoses Final diagnoses:  Non-intractable vomiting with nausea, unspecified vomiting type  Dehydration    Rx / DC Orders ED Discharge Orders     None        Isla Pence, MD 10/03/20 [phone]

## 2020-10-03 NOTE — ED Notes (Signed)
Pt to CT

## 2020-10-03 NOTE — Discharge Instructions (Addendum)
We saw in the ER for nausea and vomiting. The work-up in the ER which included basic blood work, urine analysis, cardiac work-up and also CT scan is reassuring.  It is unclear why you are having nausea and vomiting.  We suspect that your symptoms are likely going to be self-limiting, and get better on their own.  However, you will need close monitoring by the staff at the facility and family.  You should also inform the staff if your symptoms are getting worse.  Make sure that you prevent unnecessary walking, including going to the bathroom if possible, to reduce the risk of falls.  We recommend that you get the provider at the facility, and reassess you early next week. We also recommend that you get some physical therapy if possible this coming week.  Return to the ER if your symptoms are progressing or getting worse.

## 2020-10-03 NOTE — ED Notes (Signed)
RN emptied pt's colostomy, moderate amt soft brown stool.

## 2020-10-03 NOTE — ED Notes (Signed)
Pt ambulated with walker without dizziness or nausea, EDP updated.

## 2020-10-03 NOTE — ED Triage Notes (Signed)
Pt BIB EMS for nausea/vomiting. Pt has had mild nausea x1 week r/t oral antibiotic for UTI, finished antibiotic course yesterday but woke up 0400 severely nauseated and dizzy. Pt was on RA for EMS who reported saturations WNL but pt found to be 85% on RA on arrival, RN applied 2LPM per [place]. Pt intermittently dry heaving, retching but no vomit.

## 2020-10-03 NOTE — ED Notes (Signed)
Pt ambulated with unsteady gait 54f, requires walker and moderate assistance. Felt very tired after.

## 2020-10-03 NOTE — ED Notes (Signed)
Pt tolerated PO challenge with gingerale and saltines, pending ambulation trial.

## 2020-10-03 NOTE — ED Notes (Signed)
Pt resting with eyes closed, appears to be sleeping comfortably, VSS, no distress noted. Titrated to room air. Family member at bedside. Awaiting CT.

## 2020-10-21 ENCOUNTER — Other Ambulatory Visit: Payer: Self-pay

## 2020-10-21 ENCOUNTER — Inpatient Hospital Stay (HOSPITAL_COMMUNITY)
Admission: EM | Admit: 2020-10-21 | Discharge: 2020-10-27 | DRG: 690 | Disposition: A | Discharge status: Skilled Nursing Facility | Payer: Medicare PPO | Source: Skilled Nursing Facility | Attending: Internal Medicine | Admitting: Internal Medicine

## 2020-10-21 ENCOUNTER — Encounter (HOSPITAL_COMMUNITY): Payer: Self-pay

## 2020-10-21 ENCOUNTER — Emergency Department (HOSPITAL_COMMUNITY): Payer: Medicare PPO

## 2020-10-21 DIAGNOSIS — R531 Weakness: Secondary | ICD-10-CM | POA: Diagnosis present

## 2020-10-21 DIAGNOSIS — Z8673 Personal history of transient ischemic attack (TIA), and cerebral infarction without residual deficits: Secondary | ICD-10-CM

## 2020-10-21 DIAGNOSIS — N390 Urinary tract infection, site not specified: Principal | ICD-10-CM | POA: Diagnosis present

## 2020-10-21 DIAGNOSIS — I110 Hypertensive heart disease with heart failure: Secondary | ICD-10-CM | POA: Diagnosis present

## 2020-10-21 DIAGNOSIS — I2510 Atherosclerotic heart disease of native coronary artery without angina pectoris: Secondary | ICD-10-CM | POA: Diagnosis present

## 2020-10-21 DIAGNOSIS — Z955 Presence of coronary angioplasty implant and graft: Secondary | ICD-10-CM

## 2020-10-21 DIAGNOSIS — Z993 Dependence on wheelchair: Secondary | ICD-10-CM

## 2020-10-21 DIAGNOSIS — Z79899 Other long term (current) drug therapy: Secondary | ICD-10-CM

## 2020-10-21 DIAGNOSIS — Z880 Allergy status to penicillin: Secondary | ICD-10-CM

## 2020-10-21 DIAGNOSIS — Z882 Allergy status to sulfonamides status: Secondary | ICD-10-CM

## 2020-10-21 DIAGNOSIS — R680 Hypothermia, not associated with low environmental temperature: Secondary | ICD-10-CM | POA: Diagnosis present

## 2020-10-21 DIAGNOSIS — Z888 Allergy status to other drugs, medicaments and biological substances status: Secondary | ICD-10-CM

## 2020-10-21 DIAGNOSIS — Z9841 Cataract extraction status, right eye: Secondary | ICD-10-CM

## 2020-10-21 DIAGNOSIS — R1310 Dysphagia, unspecified: Secondary | ICD-10-CM | POA: Diagnosis present

## 2020-10-21 DIAGNOSIS — M8580 Other specified disorders of bone density and structure, unspecified site: Secondary | ICD-10-CM | POA: Diagnosis present

## 2020-10-21 DIAGNOSIS — I252 Old myocardial infarction: Secondary | ICD-10-CM

## 2020-10-21 DIAGNOSIS — Z961 Presence of intraocular lens: Secondary | ICD-10-CM | POA: Diagnosis present

## 2020-10-21 DIAGNOSIS — E039 Hypothyroidism, unspecified: Secondary | ICD-10-CM | POA: Diagnosis present

## 2020-10-21 DIAGNOSIS — Z7982 Long term (current) use of aspirin: Secondary | ICD-10-CM

## 2020-10-21 DIAGNOSIS — Z66 Do not resuscitate: Secondary | ICD-10-CM | POA: Diagnosis present

## 2020-10-21 DIAGNOSIS — R251 Tremor, unspecified: Secondary | ICD-10-CM | POA: Diagnosis present

## 2020-10-21 DIAGNOSIS — Z96641 Presence of right artificial hip joint: Secondary | ICD-10-CM | POA: Diagnosis present

## 2020-10-21 DIAGNOSIS — Z9049 Acquired absence of other specified parts of digestive tract: Secondary | ICD-10-CM

## 2020-10-21 DIAGNOSIS — H9190 Unspecified hearing loss, unspecified ear: Secondary | ICD-10-CM | POA: Diagnosis present

## 2020-10-21 DIAGNOSIS — I480 Paroxysmal atrial fibrillation: Secondary | ICD-10-CM | POA: Diagnosis present

## 2020-10-21 DIAGNOSIS — A419 Sepsis, unspecified organism: Secondary | ICD-10-CM | POA: Diagnosis present

## 2020-10-21 DIAGNOSIS — Z974 Presence of external hearing-aid: Secondary | ICD-10-CM

## 2020-10-21 DIAGNOSIS — Z9842 Cataract extraction status, left eye: Secondary | ICD-10-CM

## 2020-10-21 DIAGNOSIS — E739 Lactose intolerance, unspecified: Secondary | ICD-10-CM | POA: Diagnosis present

## 2020-10-21 DIAGNOSIS — Z90710 Acquired absence of both cervix and uterus: Secondary | ICD-10-CM

## 2020-10-21 DIAGNOSIS — Z85048 Personal history of other malignant neoplasm of rectum, rectosigmoid junction, and anus: Secondary | ICD-10-CM

## 2020-10-21 DIAGNOSIS — I10 Essential (primary) hypertension: Secondary | ICD-10-CM | POA: Diagnosis present

## 2020-10-21 DIAGNOSIS — Z933 Colostomy status: Secondary | ICD-10-CM

## 2020-10-21 DIAGNOSIS — M1990 Unspecified osteoarthritis, unspecified site: Secondary | ICD-10-CM | POA: Diagnosis present

## 2020-10-21 DIAGNOSIS — Z79890 Hormone replacement therapy (postmenopausal): Secondary | ICD-10-CM

## 2020-10-21 DIAGNOSIS — Z20822 Contact with and (suspected) exposure to covid-19: Secondary | ICD-10-CM | POA: Diagnosis present

## 2020-10-21 DIAGNOSIS — I5032 Chronic diastolic (congestive) heart failure: Secondary | ICD-10-CM | POA: Diagnosis present

## 2020-10-21 DIAGNOSIS — Z885 Allergy status to narcotic agent status: Secondary | ICD-10-CM

## 2020-10-21 DIAGNOSIS — E785 Hyperlipidemia, unspecified: Secondary | ICD-10-CM | POA: Diagnosis present

## 2020-10-21 LAB — COMPREHENSIVE METABOLIC PANEL
ALT: 13 U/L (ref 0–44)
AST: 29 U/L (ref 15–41)
Albumin: 3.2 g/dL — ABNORMAL LOW (ref 3.5–5.0)
Alkaline Phosphatase: 81 U/L (ref 38–126)
Anion gap: 11 (ref 5–15)
BUN: 19 mg/dL (ref 8–23)
CO2: 26 mmol/L (ref 22–32)
Calcium: 9.5 mg/dL (ref 8.9–10.3)
Chloride: 98 mmol/L (ref 98–111)
Creatinine, Ser: 1.12 mg/dL — ABNORMAL HIGH (ref 0.44–1.00)
GFR, Estimated: 43 mL/min — ABNORMAL LOW (ref >60)
Glucose, Bld: 98 mg/dL (ref 70–99)
Potassium: 4.7 mmol/L (ref 3.5–5.1)
Sodium: 135 mmol/L (ref 135–145)
Total Bilirubin: 0.6 mg/dL (ref 0.3–1.2)
Total Protein: 7 g/dL (ref 6.5–8.1)

## 2020-10-21 LAB — CBC WITH DIFFERENTIAL/PLATELET
Abs Immature Granulocytes: 0.01 10*3/uL (ref 0.00–0.07)
Basophils Absolute: 0 10*3/uL (ref 0.0–0.1)
Basophils Relative: 0 %
Eosinophils Absolute: 0.1 10*3/uL (ref 0.0–0.5)
Eosinophils Relative: 2 %
HCT: 41.9 % (ref 36.0–46.0)
Hemoglobin: 13.4 g/dL (ref 12.0–15.0)
Immature Granulocytes: 0 %
Lymphocytes Relative: 30 %
Lymphs Abs: 2.2 10*3/uL (ref 0.7–4.0)
MCH: 30.9 pg (ref 26.0–34.0)
MCHC: 32 g/dL (ref 30.0–36.0)
MCV: 96.5 fL (ref 80.0–100.0)
Monocytes Absolute: 0.5 10*3/uL (ref 0.1–1.0)
Monocytes Relative: 8 %
Neutro Abs: 4.2 10*3/uL (ref 1.7–7.7)
Neutrophils Relative %: 60 %
Platelets: 228 10*3/uL (ref 150–400)
RBC: 4.34 MIL/uL (ref 3.87–5.11)
RDW: 13.8 % (ref 11.5–15.5)
WBC: 7.1 10*3/uL (ref 4.0–10.5)
nRBC: 0 % (ref 0.0–0.2)

## 2020-10-21 MED ORDER — SODIUM CHLORIDE 0.9 % IV BOLUS
500.0000 mL | Freq: Once | INTRAVENOUS | Status: AC
  Administered 2020-10-21: 500 mL via INTRAVENOUS

## 2020-10-21 NOTE — ED Triage Notes (Signed)
Pt bib [facility] for malaise and tremors from [facility] ([place]. [place]). Pt started having tremors last night and has been more tired and weak than usual. Pt denies any other complaints.

## 2020-10-21 NOTE — ED Provider Notes (Signed)
[facility] EMERGENCY DEPARTMENT Provider Note   CSN: [id number] Arrival date & time: 10/21/20  2114     History Chief Complaint  Patient presents with   Weakness    [name] is a 87 y.o. female.  HPI Patient presents from nursing facility with weakness, tremor.  But states that she was generally well until yesterday.  Since that time she has had generalized weakness, shakiness, without focal pain, focal weakness.  No fever, chills, nausea, vomiting.  She has that she is taking antibiotic for urinary tract infection, but is unsure of which one.    Past Medical History:  Diagnosis Date   Arthritis    FINGERS    CAD (coronary artery disease) CARDIOLOGIST-- DR Angelena Form   a. s/p INF STEMI 7/12: tx with BMS to RCA;  b. cath 08/26/10: pLAD 30%, mLAD 50%, D1 40%, pCFX 95%, mRCA occluded;   c. staged PCI of pCFX with BMS;   d. echo 7/12:   EF 60-65%, mild RAE, mild to moderate AI, mild MR, moderate TR, RVE, PASP 47   Complication of anesthesia PT STATES "MADE HER FEEL CRAZY"   Degeneration of eye    left eye cornea   Dyspnea    First degree heart block    Heart palpitations PAC'S AND SVT RUN'S  PER CARDIOLOGIST NOTE   History of ST elevation myocardial infarction (STEMI) 08-26-2010-- INFERIOR WALL   S/P PCI  BMS IN RCA AND PROX. CX   Hyperlipidemia    Hypertension    Impaired hearing BILATERAL HEARING AIDS   Osteopenia    PAF (paroxysmal atrial fibrillation) (HCC)    PONV (postoperative nausea and vomiting)    Pulmonary nodules BENIGN  PER CT  10-12-2010   Rectal Cancer 08/2010   adenocarcinoma   S/P PARTIAL PROCTECTOMY (NO CHEMO OR RADIATION)   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 01/11/2011   S/P colostomy ([place]) SECONDARY TO RECTOVAGINAL FISTULA   S/P coronary artery stent placement 08/2010   X2  BM    Patient Active Problem List   Diagnosis Date Noted   Malnutrition of moderate degree 08/14/2020   Hip pain 08/13/2020   Closed  fracture of right femur ([place]) 08/12/2020   Syncope 06/29/2020   Atrial fibrillation with RVR ([place]) 05/03/2020   Other cirrhosis of liver ([place]) 05/01/2020   Paroxysmal atrial fibrillation with rapid ventricular response (HCC) 04/30/2020   Chronic diastolic CHF (congestive heart failure) ([place]) 04/30/2020   Elevated LFTs 04/30/2020   AKI (acute kidney injury) ([place]) 04/30/2020   Atrial flutter ([place]) 04/27/2020   Secondary hypercoagulable state ([place]) 04/27/2020   Closed fracture of multiple pubic rami, left, initial encounter ([place]) 09/06/2019   Slurred speech 02/23/2017   PAF (paroxysmal atrial fibrillation) ([place]) 02/23/2017   Depression with anxiety 02/23/2017   TIA (transient ischemic attack)    Fracture, tibia and fibula 07/06/2015   Tibia/fibula fracture 07/03/2015   Lung nodule seen on imaging study 09/05/2013   Exposure to TB 09/05/2013   SOB (shortness of breath) 10/02/2011   Vascular skin changes 07/17/2011   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 01/11/2011   Anorexia post-op 12/08/2010   HTN (hypertension) 11/03/2010   Dizziness 09/15/2010   CAD (coronary artery disease)    Hyperlipidemia    Rectal cancer, pT2uN0(pNX) s/p TEM partial proctectomy 09/07/2010    Past Surgical History:  Procedure Laterality Date   ABDOMINAL HYSTERECTOMY  1950's   AND APPENDECTOMY   CATARACT EXTRACTION W/  INTRAOCULAR LENS  IMPLANT, BILATERAL     CORONARY ANGIOPLASTY WITH STENT PLACEMENT  08-26-2010  DR Angelena Form   PCI, BM STENT IN RCA   CORONARY ANGIOPLASTY WITH STENT PLACEMENT  08-29-2010  DR [place]   PCI, BM STENT IN PROXIMAL CIRCUMFLEX   EXCISION BENIGN CYST RIGHT BREAST     FISTULA PLUG N/A 06/21/2012   Procedure: insertion of FISTULA PLUG;  Surgeon: Leighton Ruff, MD;  Location: [place];  Service: General;  Laterality: N/A;   FLEXIBLE SIGMOIDOSCOPY N/A 04/02/2012   Procedure: FLEXIBLE SIGMOIDOSCOPY;  Surgeon: Leighton Ruff, MD;  Location: [facility];   Service: Endoscopy;  Laterality: N/A;   KNEE SURGERY Right 1996   LAPROSCOPY LYSIS ADHESIONS/ DRAINAGE OF PELVIC ABSCESS/ DIVERTING LOOP SIGMOID COLECTOMY  11-22-2010   POST OP RECTOVAGINAL FISTULA   PARTIAL PROCTECTOMY BY TEM  11-17-2010   RECTAL CANCER   RELEASE LEFT CARPAL TUNNEL/ OSTEOTOMY LEFT DISTAL RADIUS  11-10-2009   STAPEDECTOMY  1970'S   TONSILLECTOMY  CHILD   TOTAL HIP ARTHROPLASTY Right 1992   TRANSTHORACIC ECHOCARDIOGRAM  10-10-2011   NORMAL LV SIZE WITH MILD FOCAL BASAL SEPTAL HYPERTROPHY/ EF 55-60%/ NORMAL RV SIZE AND LVSF/ BIATRIAL ENLARGEMENT/ MILD TO MODERATE AI  &  TR   TYMPANOPLASTY Left 12-23-2009     OB History   No obstetric history on file.     Family History  Problem Relation Age of Onset   Kidney disease Mother    Cancer Brother     Social History   Tobacco Use   Smoking status: Never   Smokeless tobacco: Never  Vaping Use   Vaping Use: Never used  Substance Use Topics   Alcohol use: No   Drug use: No    Home Medications Prior to Admission medications   Medication Sig Start Date End Date Taking? Authorizing Provider  acetaminophen (TYLENOL) 500 MG tablet Take 2 tablets (1,000 mg total) by mouth every 6 (six) hours as needed for mild pain. 07/06/15   Rama, Venetia Maxon, MD  amiodarone (PACERONE) 200 MG tablet 1 tab (200 mg total) by mouth twice daily for 1 week followed by 1 tab (200 mg total) by mouth daily thereafter. Patient taking differently: Take 200 mg by mouth daily. 06/25/20   Burnell Blanks, MD  aspirin EC 81 MG tablet Take 81 mg by mouth every morning.    [provider]  beta carotene w/minerals (OCUVITE) tablet Take 1 tablet by mouth every morning.    [provider]  cholecalciferol (VITAMIN D3) 25 MCG (1000 UNIT) tablet Take 1,000 Units by mouth every morning.    [provider]  clonazePAM (KLONOPIN) 0.5 MG tablet Take 0.5 tablets (0.25 mg total) by mouth at bedtime. 08/18/20   Caren Griffins,  MD  furosemide (LASIX) 40 MG tablet Take 20 mg by mouth every morning.    [provider]  HYDROcodone-acetaminophen (NORCO/VICODIN) 5-325 MG tablet Take 1 tablet by mouth every 6 (six) hours as needed for moderate pain. Patient not taking: Reported on 10/03/2020 08/18/20   Caren Griffins, MD  levothyroxine (SYNTHROID) 25 MCG tablet Take 25 mcg by mouth daily. 09/09/20   [provider]  melatonin 5 MG TABS Take 5 mg by mouth at bedtime.    [provider]  nitrofurantoin, macrocrystal-monohydrate, (MACROBID) 100 MG capsule Take 100 mg by mouth 2 (two) times daily. 09/22/20   [provider]  nitroGLYCERIN (NITROSTAT) 0.4 MG SL tablet Place 0.4 mg under the tongue every  5 (five) minutes as needed for chest pain.    [provider]  ondansetron (ZOFRAN ODT) 8 MG disintegrating tablet Take 1 tablet (8 mg total) by mouth every 8 (eight) hours as needed for nausea. 10/03/20   Varney Biles, MD  Polyethyl Glycol-Propyl Glycol (SYSTANE) 0.4-0.3 % GEL ophthalmic gel Place 1 drop into both eyes at bedtime.     [provider]  sertraline (ZOLOFT) 25 MG tablet Take 25 mg by mouth at bedtime. 10/28/19   [provider]    Allergies    Effexor [venlafaxine hydrochloride], Penicillins, Sulfa antibiotics, Dicyclomine, Lactose intolerance (gi), and Hydrocodone  Review of Systems   Review of Systems  Constitutional:        Per HPI, otherwise negative  HENT:         Per HPI, otherwise negative  Respiratory:         Per HPI, otherwise negative  Cardiovascular:        Per HPI, otherwise negative  Gastrointestinal:  Negative for vomiting.  Endocrine:       Negative aside from HPI  Genitourinary:        Neg aside from HPI   Musculoskeletal:        Per HPI, otherwise negative  Skin: Negative.   Neurological:  Negative for syncope.   Physical Exam Updated Vital Signs BP (!) 183/59 (BP Location: Left Arm)   Pulse 74   Temp 98.2 F (36.8  C) (Oral)   Resp 18   SpO2 95%   Physical Exam Vitals and nursing note reviewed.  Constitutional:      General: She is not in acute distress.    Appearance: She is well-developed.  HENT:     Head: Normocephalic and atraumatic.  Eyes:     Conjunctiva/sclera: Conjunctivae normal.  Cardiovascular:     Rate and Rhythm: Normal rate and regular rhythm.     Heart sounds: Murmur heard.  Pulmonary:     Effort: Pulmonary effort is normal. No respiratory distress.     Breath sounds: Normal breath sounds. No stridor.  Abdominal:     General: There is no distension.     Tenderness: There is no abdominal tenderness. There is no guarding.     Comments: Colostomy bag otherwise unremarkable  Skin:    General: Skin is warm and dry.  Neurological:     Mental Status: She is alert and oriented to person, place, and time.     Cranial Nerves: No cranial nerve deficit.     Motor: Atrophy present.  Psychiatric:        Mood and Affect: Mood normal.        Behavior: Behavior normal.    ED Results / Procedures / Treatments   Labs (all labs ordered are listed, but only abnormal results are displayed) Labs Reviewed  URINE CULTURE  COMPREHENSIVE METABOLIC PANEL  CBC WITH DIFFERENTIAL/PLATELET  URINALYSIS, ROUTINE W REFLEX MICROSCOPIC    EKG EKG Interpretation  Date/Time:  Thursday October 21 2020 21:28:32 EDT Ventricular Rate:  74 PR Interval:  224 QRS Duration: 169 QT Interval:  474 QTC Calculation: 526 R Axis:   -11 Text Interpretation: Sinus rhythm Prolonged PR interval Left bundle branch block Abnormal ECG Confirmed by Carmin Muskrat [phone]) on 10/21/2020 9:59:24 PM  Radiology DG Chest Port 1 View  Result Date: 10/21/2020 CLINICAL DATA:  Weakness. EXAM: PORTABLE CHEST 1 VIEW COMPARISON:  August 12, 2020. FINDINGS: Stable cardiomediastinal silhouette. Stable right midlung subsegmental atelectasis or scarring is noted.  Mild bibasilar subsegmental atelectasis is noted. The visualized  skeletal structures are unremarkable. IMPRESSION: Mild bibasilar subsegmental atelectasis. Stable right midlung subsegmental atelectasis or scarring. Aortic Atherosclerosis (ICD10-I70.0). Electronically Signed   By: Marijo Conception M.D.   On: 10/21/2020 21:49    Procedures Procedures   Medications Ordered in ED Medications - No data to display  ED Course  I have reviewed the triage vital signs and the nursing notes.  Pertinent labs & imaging results that were available during my care of the patient were reviewed by me and considered in my medical decision making (see chart for details).  Pulse oximetry 99% room air normal Cardiac 70s sinus normal  MDM Rules/Calculators/A&P Elderly female presents with weakness.  There is no focal weakness, nor complaints of pain.  She is awake, alert, afebrile.  Patient's vital signs are reassuring.  Initial labs available prior to signout notable for slight abnormality in creatinine suggesting dehydration as a possible etiology.  Patient has begun receive fluid resuscitation.  On signout she is awaiting additional lab results, urinalysis results.  Dr. Dayna Barker aware Final Clinical Impression(s) / ED Diagnoses Final diagnoses:  Weakness     Carmin Muskrat, MD 10/21/20 2355

## 2020-10-22 ENCOUNTER — Encounter (HOSPITAL_COMMUNITY): Payer: Self-pay | Admitting: Internal Medicine

## 2020-10-22 DIAGNOSIS — I2510 Atherosclerotic heart disease of native coronary artery without angina pectoris: Secondary | ICD-10-CM | POA: Diagnosis present

## 2020-10-22 DIAGNOSIS — E785 Hyperlipidemia, unspecified: Secondary | ICD-10-CM | POA: Diagnosis present

## 2020-10-22 DIAGNOSIS — Z974 Presence of external hearing-aid: Secondary | ICD-10-CM | POA: Diagnosis not present

## 2020-10-22 DIAGNOSIS — Z66 Do not resuscitate: Secondary | ICD-10-CM | POA: Diagnosis present

## 2020-10-22 DIAGNOSIS — Z955 Presence of coronary angioplasty implant and graft: Secondary | ICD-10-CM | POA: Diagnosis not present

## 2020-10-22 DIAGNOSIS — E739 Lactose intolerance, unspecified: Secondary | ICD-10-CM | POA: Diagnosis present

## 2020-10-22 DIAGNOSIS — N390 Urinary tract infection, site not specified: Principal | ICD-10-CM

## 2020-10-22 DIAGNOSIS — Z933 Colostomy status: Secondary | ICD-10-CM | POA: Diagnosis not present

## 2020-10-22 DIAGNOSIS — Z961 Presence of intraocular lens: Secondary | ICD-10-CM | POA: Diagnosis present

## 2020-10-22 DIAGNOSIS — Z96641 Presence of right artificial hip joint: Secondary | ICD-10-CM | POA: Diagnosis present

## 2020-10-22 DIAGNOSIS — R1310 Dysphagia, unspecified: Secondary | ICD-10-CM | POA: Diagnosis present

## 2020-10-22 DIAGNOSIS — I480 Paroxysmal atrial fibrillation: Secondary | ICD-10-CM | POA: Diagnosis present

## 2020-10-22 DIAGNOSIS — R251 Tremor, unspecified: Secondary | ICD-10-CM | POA: Diagnosis present

## 2020-10-22 DIAGNOSIS — I110 Hypertensive heart disease with heart failure: Secondary | ICD-10-CM | POA: Diagnosis present

## 2020-10-22 DIAGNOSIS — M8580 Other specified disorders of bone density and structure, unspecified site: Secondary | ICD-10-CM | POA: Diagnosis present

## 2020-10-22 DIAGNOSIS — R531 Weakness: Secondary | ICD-10-CM

## 2020-10-22 DIAGNOSIS — M1990 Unspecified osteoarthritis, unspecified site: Secondary | ICD-10-CM | POA: Diagnosis present

## 2020-10-22 DIAGNOSIS — I252 Old myocardial infarction: Secondary | ICD-10-CM | POA: Diagnosis not present

## 2020-10-22 DIAGNOSIS — I5032 Chronic diastolic (congestive) heart failure: Secondary | ICD-10-CM | POA: Diagnosis present

## 2020-10-22 DIAGNOSIS — Z85048 Personal history of other malignant neoplasm of rectum, rectosigmoid junction, and anus: Secondary | ICD-10-CM | POA: Diagnosis not present

## 2020-10-22 DIAGNOSIS — H9190 Unspecified hearing loss, unspecified ear: Secondary | ICD-10-CM | POA: Diagnosis present

## 2020-10-22 DIAGNOSIS — E039 Hypothyroidism, unspecified: Secondary | ICD-10-CM | POA: Diagnosis present

## 2020-10-22 DIAGNOSIS — A419 Sepsis, unspecified organism: Secondary | ICD-10-CM | POA: Diagnosis present

## 2020-10-22 DIAGNOSIS — R680 Hypothermia, not associated with low environmental temperature: Secondary | ICD-10-CM | POA: Diagnosis present

## 2020-10-22 DIAGNOSIS — Z20822 Contact with and (suspected) exposure to covid-19: Secondary | ICD-10-CM | POA: Diagnosis present

## 2020-10-22 LAB — CBC
HCT: 40.4 % (ref 36.0–46.0)
Hemoglobin: 13.2 g/dL (ref 12.0–15.0)
MCH: 30.9 pg (ref 26.0–34.0)
MCHC: 32.7 g/dL (ref 30.0–36.0)
MCV: 94.6 fL (ref 80.0–100.0)
Platelets: 242 10*3/uL (ref 150–400)
RBC: 4.27 MIL/uL (ref 3.87–5.11)
RDW: 14 % (ref 11.5–15.5)
WBC: 8.1 10*3/uL (ref 4.0–10.5)
nRBC: 0 % (ref 0.0–0.2)

## 2020-10-22 LAB — RESP PANEL BY RT-PCR (FLU A&B, COVID) ARPGX2
Influenza A by PCR: NEGATIVE
Influenza B by PCR: NEGATIVE
SARS Coronavirus 2 by RT PCR: NEGATIVE

## 2020-10-22 LAB — URINALYSIS, ROUTINE W REFLEX MICROSCOPIC
Bacteria, UA: NONE SEEN
Bilirubin Urine: NEGATIVE
Glucose, UA: NEGATIVE mg/dL
Hgb urine dipstick: NEGATIVE
Ketones, ur: NEGATIVE mg/dL
Nitrite: POSITIVE — AB
Protein, ur: NEGATIVE mg/dL
Specific Gravity, Urine: 1.017 (ref 1.005–1.030)
WBC, UA: >50 WBC/hpf — ABNORMAL HIGH (ref 0–5)
pH: 6 (ref 5.0–8.0)

## 2020-10-22 LAB — CORTISOL: Cortisol, Plasma: 19 ug/dL

## 2020-10-22 LAB — TSH: TSH: 7.682 u[IU]/mL — ABNORMAL HIGH (ref 0.350–4.500)

## 2020-10-22 LAB — CREATININE, SERUM
Creatinine, Ser: 0.93 mg/dL (ref 0.44–1.00)
GFR, Estimated: 54 mL/min — ABNORMAL LOW (ref >60)

## 2020-10-22 MED ORDER — ACETAMINOPHEN 650 MG RE SUPP: 650.0000 mg | Freq: Four times a day (QID) | RECTAL | Status: DC | PRN

## 2020-10-22 MED ORDER — ACETAMINOPHEN 325 MG PO TABS
325.0000 mg | ORAL_TABLET | Freq: Once | ORAL | Status: AC
  Administered 2020-10-22: 325 mg via ORAL
  Filled 2020-10-22: qty 1

## 2020-10-22 MED ORDER — NITROGLYCERIN 0.4 MG SL SUBL: 0.4000 mg | SUBLINGUAL_TABLET | SUBLINGUAL | Status: DC | PRN

## 2020-10-22 MED ORDER — LEVOTHYROXINE SODIUM 25 MCG PO TABS
25.0000 ug | ORAL_TABLET | Freq: Every day | ORAL | Status: DC
  Administered 2020-10-22 – 2020-10-27 (×6): 25 ug via ORAL
  Filled 2020-10-22 (×6): qty 1

## 2020-10-22 MED ORDER — ASPIRIN EC 81 MG PO TBEC
81.0000 mg | DELAYED_RELEASE_TABLET | Freq: Every morning | ORAL | Status: DC
  Administered 2020-10-22 – 2020-10-27 (×6): 81 mg via ORAL
  Filled 2020-10-22 (×5): qty 1

## 2020-10-22 MED ORDER — SERTRALINE HCL 50 MG PO TABS
25.0000 mg | ORAL_TABLET | Freq: Every day | ORAL | Status: DC
  Administered 2020-10-22 – 2020-10-26 (×5): 25 mg via ORAL
  Filled 2020-10-22 (×5): qty 1

## 2020-10-22 MED ORDER — CIPROFLOXACIN IN D5W 400 MG/200ML IV SOLN
400.0000 mg | Freq: Once | INTRAVENOUS | Status: AC
  Administered 2020-10-22: 400 mg via INTRAVENOUS
  Filled 2020-10-22: qty 200

## 2020-10-22 MED ORDER — CLONAZEPAM 0.25 MG PO TBDP
0.2500 mg | ORAL_TABLET | Freq: Every day | ORAL | Status: DC
  Administered 2020-10-22 – 2020-10-26 (×5): 0.25 mg via ORAL
  Filled 2020-10-22 (×6): qty 1

## 2020-10-22 MED ORDER — HEPARIN SODIUM (PORCINE) 5000 UNIT/ML IJ SOLN
5000.0000 [IU] | Freq: Three times a day (TID) | INTRAMUSCULAR | Status: DC
  Administered 2020-10-22 – 2020-10-27 (×15): 5000 [IU] via SUBCUTANEOUS
  Filled 2020-10-22 (×14): qty 1

## 2020-10-22 MED ORDER — AMIODARONE HCL 200 MG PO TABS
200.0000 mg | ORAL_TABLET | Freq: Every day | ORAL | Status: DC
  Administered 2020-10-22 – 2020-10-27 (×6): 200 mg via ORAL
  Filled 2020-10-22 (×6): qty 1

## 2020-10-22 MED ORDER — ARTIFICIAL TEARS OPHTHALMIC OINT
TOPICAL_OINTMENT | Freq: Every day | OPHTHALMIC | Status: DC
  Administered 2020-10-25: 1 via OPHTHALMIC
  Filled 2020-10-22: qty 3.5

## 2020-10-22 MED ORDER — ACETAMINOPHEN 325 MG PO TABS
650.0000 mg | ORAL_TABLET | Freq: Four times a day (QID) | ORAL | Status: DC | PRN
  Administered 2020-10-23 – 2020-10-26 (×3): 650 mg via ORAL
  Filled 2020-10-22 (×3): qty 2

## 2020-10-22 MED ORDER — SODIUM CHLORIDE 0.9 % IV SOLN
1.0000 g | INTRAVENOUS | Status: DC
  Administered 2020-10-22 – 2020-10-23 (×2): 1 g via INTRAVENOUS
  Filled 2020-10-22 (×2): qty 10

## 2020-10-22 NOTE — ED Notes (Signed)
Report given to Lourena Simmonds, RN

## 2020-10-22 NOTE — ED Notes (Signed)
Placed pt on bair hugger 

## 2020-10-22 NOTE — ED Provider Notes (Signed)
2:22 AM Assumed care from Dr. Vanita Panda, please see their note for full history, physical and decision making until this point. In brief this is a 87 y.o. year old female who presented to the ED tonight with Weakness     Generalized weakness and episodic shaking. Unclear etiology. Pending UA and observation with reevaluation for disposition.   UA does appear infected, 2/2 allergies, cipro started. Culture already in process. On reeval she is asking to go homne. Niece states that it appears that her shaking is improving. Will reeval after abx finished for disposition.   Still weak. Usually uses a walker, not feeling up to it right now. Still with episodes of shaking. Will check rectal temp. D/w medicine for observation until back near baseline.   Labs, studies and imaging reviewed by myself and considered in medical decision making if ordered. Imaging interpreted by radiology.  Labs Reviewed  COMPREHENSIVE METABOLIC PANEL - Abnormal; Notable for the following components:      Result Value   Creatinine, Ser 1.12 (*)    Albumin 3.2 (*)    GFR, Estimated 43 (*)    All other components within normal limits  URINALYSIS, ROUTINE W REFLEX MICROSCOPIC - Abnormal; Notable for the following components:   APPearance HAZY (*)    Nitrite POSITIVE (*)    Leukocytes,Ua LARGE (*)    WBC, UA >50 (*)    All other components within normal limits  RESP PANEL BY RT-PCR (FLU A&B, COVID) [id number]  URINE CULTURE  CBC WITH DIFFERENTIAL/PLATELET  CBC WITH DIFFERENTIAL/PLATELET    DG Chest Port 1 View  Final Result      No follow-ups on file.    Norah Fick, Corene Cornea, MD 10/22/20 [phone]

## 2020-10-22 NOTE — Progress Notes (Signed)
Admitted earlier this morning by Dr. Chrystie Nose see H&P for further details.  Subjective: Lying comfortably in bed-think she feels better.  Vitals: Blood pressure (!) 111/50, pulse 66, temperature (!) 97 F (36.1 C), temperature source Rectal, resp. rate 17, SpO2 98 %.   Exam: CVS: S1-S2 regular. Chest: Clear to auscultation Abdomen:soft-nontender nondistended Neuro: Nonfocal  Assessment/plan As outlined by by Dr. Lucrezia Starch has sepsis physiology from complicated UTI.  Continue IV Rocephin-and await culture data.  Niece-[phone] updated.

## 2020-10-22 NOTE — ED Notes (Signed)
Assisted to position of eating

## 2020-10-22 NOTE — ED Notes (Signed)
Pt resting in sleep position. Repositioned per request. Denies feeling cold. Lights dimmed per request. Urine culture added.

## 2020-10-22 NOTE — Evaluation (Signed)
Physical Therapy Evaluation Patient Details Name: [name] MRN: [id number] DOB: Jun 14, 1916 Today's Date: 10/22/2020   History of Present Illness  Pt is 87 yo female admitted on 10/21/20 with weakness, shaking spells with hypothermia, and UTI.  She has hx including but not limited to CAD with stentis, CHF, hypothryoidism, a flutter, rectal CA with hemicolectomy and colostomy.  Clinical Impression  Pt admitted with above diagnosis. At baseline pt resides at [facility] and reports has assist with ADLs and ambulates in hallway using RW.  Today, pt shaking throughout and reports generalized aching/pain.  She required min-mod A for transfers and only ambulated 8' with min A and very slow shuffle gait.  She was very fearful of falling and not being able to walk again. She is well below her baseline.  Recommend SNF at d/c.  Pt currently with functional limitations due to the deficits listed below (see PT Problem List). Pt will benefit from skilled PT to increase their independence and safety with mobility to allow discharge to the venue listed below.       Follow Up Recommendations SNF    Equipment Recommendations  None recommended by PT    Recommendations for Other Services       Precautions / Restrictions Precautions Precautions: Fall      Mobility  Bed Mobility Overal bed mobility: Needs Assistance Bed Mobility: Supine to Sit;Sit to Supine     Supine to sit: Min assist Sit to supine: Mod assist   General bed mobility comments: Requiring increased assist back to bed due to stretcher height and fear of falling    Transfers Overall transfer level: Needs assistance Equipment used: Rolling walker (2 wheeled) Transfers: Sit to/from Stand Sit to Stand: Min assist            Ambulation/Gait Ambulation/Gait assistance: Min assist Gait Distance (Feet): 8 Feet Assistive device: Rolling walker (2 wheeled) Gait Pattern/deviations: Step-to pattern;Decreased stride  length;Shuffle Gait velocity: decreased   General Gait Details: Very shakey and with shuffle steps, fatigued easily, fearful of falling  Stairs            Wheelchair Mobility    Modified Rankin (Stroke Patients Only)       Balance Overall balance assessment: Needs assistance Sitting-balance support: Feet supported;Bilateral upper extremity supported Sitting balance-Leahy Scale: Poor Sitting balance - Comments: requiring UE support but stable with UE support   Standing balance support: Bilateral upper extremity supported Standing balance-Leahy Scale: Poor Standing balance comment: Requiring RW and min A                             Pertinent Vitals/Pain Pain Assessment: Faces Pain Score: 4  Pain Location: all over - sore and aching from shaking Pain Descriptors / Indicators: Discomfort;Sore;Aching Pain Intervention(s): Limited activity within patient's tolerance;Monitored during session    Home Living Family/patient expects to be discharged to:: Skilled nursing facility                 Additional Comments: showers 1-2x/wk with aide; lives at [employer]    Prior Function Level of Independence: Needs assistance   Gait / Transfers Assistance Needed: walks with RW; could walk up and down halls at [facility]  ADL's / Homemaking Assistance Needed: Pt had some assist with ADLs except able to do toileting on her own.        Hand Dominance        Extremity/Trunk Assessment   Upper Extremity Assessment  Upper Extremity Assessment: Generalized weakness    Lower Extremity Assessment Lower Extremity Assessment: Generalized weakness (Pt shaking and sore - demonstrating at least 3/5 throughout but not further tested)    Cervical / Trunk Assessment Cervical / Trunk Assessment: Kyphotic  Communication   Communication: HOH (has hearing aides)  Cognition Arousal/Alertness: Awake/alert Behavior During Therapy: WFL for tasks assessed/performed Overall  Cognitive Status: Within Functional Limits for tasks assessed                                 General Comments: Pt pleasant and able to answer questions.  At end of session, pt tearful stating "i'll never walk again."  Offered encouragement and the fact she's only been here a few hours , still being medically managed, and she did take a few steps with therapy already.      General Comments General comments (skin integrity, edema, etc.): VSS    Exercises     Assessment/Plan    PT Assessment Patient needs continued PT services  PT Problem List Decreased strength;Decreased mobility;Decreased safety awareness;Decreased balance;Decreased knowledge of use of DME;Decreased activity tolerance;Decreased coordination       PT Treatment Interventions DME instruction;Therapeutic activities;Gait training;Therapeutic exercise;Patient/family education;Balance training;Functional mobility training    PT Goals (Current goals can be found in the Care Plan section)  Acute Rehab PT Goals Patient Stated Goal: walk without fearing falling PT Goal Formulation: With patient Time For Goal Achievement: 11/05/20 Potential to Achieve Goals: Good    Frequency Min 2X/week   Barriers to discharge        Co-evaluation               AM-PAC PT "6 Clicks" Mobility  Outcome Measure Help needed turning from your back to your side while in a flat bed without using bedrails?: A Little Help needed moving from lying on your back to sitting on the side of a flat bed without using bedrails?: A Little Help needed moving to and from a bed to a chair (including a wheelchair)?: A Lot Help needed standing up from a chair using your arms (e.g., wheelchair or bedside chair)?: A Little Help needed to walk in hospital room?: A Lot Help needed climbing 3-5 steps with a railing? : Total 6 Click Score: 14    End of Session Equipment Utilized During Treatment: Gait belt Activity Tolerance: Patient  limited by fatigue Patient left: in bed;with call bell/phone within reach (on ED stretcher, rails up, provided multiple warm blankets) Nurse Communication: Mobility status PT Visit Diagnosis: Unsteadiness on feet (R26.81);Muscle weakness (generalized) (M62.81)    Time: [id number] PT Time Calculation (min) (ACUTE ONLY): 26 min   Charges:   PT Evaluation $PT Eval Low Complexity: 1 Low PT Treatments $Therapeutic Activity: 8-22 mins        Abran Richard, PT Acute Rehab Services Pager [phone] [facility] [phone]   Karlton Lemon 10/22/2020, 12:25 PM

## 2020-10-22 NOTE — H&P (Signed)
History and Physical    [name] [id number] DOB: 1916-11-16 DOA: 10/21/2020  PCP: Leonard Downing, MD  Patient coming from: Assisted living facility.  Chief Complaint: Weakness.  HPI: [name] is a 87 y.o. female with history of CAD status post stenting, diastolic CHF, hypothyroidism, atrial flutter history of rectal cancer status post hemicolectomy and colostomy was brought to the ER after patient was feeling weak tired and poor appetite.  Patient states she has been having dysuria with on antibiotics and also developed some diarrhea.  She has been having the symptoms for almost a month now.  Denies chest pain or shortness of breath.  For the last few days patient has not been eating well.  Patient also has been having shaking spells over the last 24 hours but did not lose consciousness.  ED Course: In the ER patient is hypothermic at temperature of 96 F UA is consistent with UTI chest x-ray unremarkable.  COVID test negative.  Labs show WBC count of 7.1 creatinine 1.1 which is increased from 0.9 in October 03, 2020.  Blood cultures have been drawn admitted for UTI with generalized weakness with hypothermia.  Review of Systems: As per HPI, rest all negative.   Past Medical History:  Diagnosis Date   Arthritis    FINGERS    CAD (coronary artery disease) CARDIOLOGIST-- DR Angelena Form   a. s/p INF STEMI 7/12: tx with BMS to RCA;  b. cath 08/26/10: pLAD 30%, mLAD 50%, D1 40%, pCFX 95%, mRCA occluded;   c. staged PCI of pCFX with BMS;   d. echo 7/12:   EF 60-65%, mild RAE, mild to moderate AI, mild MR, moderate TR, RVE, PASP 47   Complication of anesthesia PT STATES "MADE HER FEEL CRAZY"   Degeneration of eye    left eye cornea   Dyspnea    First degree heart block    Heart palpitations PAC'S AND SVT RUN'S  PER CARDIOLOGIST NOTE   History of ST elevation myocardial infarction (STEMI) 08-26-2010-- INFERIOR WALL   S/P PCI  BMS IN RCA AND PROX. CX   Hyperlipidemia     Hypertension    Impaired hearing BILATERAL HEARING AIDS   Osteopenia    PAF (paroxysmal atrial fibrillation) (HCC)    PONV (postoperative nausea and vomiting)    Pulmonary nodules BENIGN  PER CT  10-12-2010   Rectal Cancer 08/2010   adenocarcinoma   S/P PARTIAL PROCTECTOMY (NO CHEMO OR RADIATION)   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 01/11/2011   S/P colostomy ([place]) SECONDARY TO RECTOVAGINAL FISTULA   S/P coronary artery stent placement 08/2010   X2  BM    Past Surgical History:  Procedure Laterality Date   ABDOMINAL HYSTERECTOMY  1950's   AND APPENDECTOMY   CATARACT EXTRACTION W/ INTRAOCULAR LENS  IMPLANT, BILATERAL     CORONARY ANGIOPLASTY WITH STENT PLACEMENT  08-26-2010  DR Foscoe   PCI, BM STENT IN RCA   CORONARY ANGIOPLASTY WITH STENT PLACEMENT  08-29-2010  DR Christoval   PCI, BM STENT IN PROXIMAL CIRCUMFLEX   EXCISION BENIGN CYST RIGHT BREAST     FISTULA PLUG N/A 06/21/2012   Procedure: insertion of FISTULA PLUG;  Surgeon: Leighton Ruff, MD;  Location: [facility];  Service: General;  Laterality: N/A;   [place] N/A 04/02/2012   Procedure: FLEXIBLE SIGMOIDOSCOPY;  Surgeon: Leighton Ruff, MD;  Location: [facility];  Service: Endoscopy;  Laterality: N/A;   KNEE SURGERY Right 1996  LAPROSCOPY LYSIS ADHESIONS/ DRAINAGE OF PELVIC ABSCESS/ DIVERTING LOOP SIGMOID COLECTOMY  11-22-2010   POST OP RECTOVAGINAL FISTULA   PARTIAL PROCTECTOMY BY TEM  11-17-2010   RECTAL CANCER   RELEASE LEFT CARPAL TUNNEL/ OSTEOTOMY LEFT DISTAL RADIUS  11-10-2009   STAPEDECTOMY  1970'S   TONSILLECTOMY  CHILD   TOTAL HIP ARTHROPLASTY Right 1992   TRANSTHORACIC ECHOCARDIOGRAM  10-10-2011   NORMAL LV SIZE WITH MILD FOCAL BASAL SEPTAL HYPERTROPHY/ EF 55-60%/ NORMAL RV SIZE AND LVSF/ BIATRIAL ENLARGEMENT/ MILD TO MODERATE AI  &  TR   TYMPANOPLASTY Left 12-23-2009     reports that she has never smoked. She has never used smokeless tobacco. She reports that  she does not drink alcohol and does not use drugs.  Allergies  Allergen Reactions   Effexor [Venlafaxine Hydrochloride] Nausea And Vomiting   Penicillins Shortness Of Breath    Has patient had a PCN reaction causing immediate rash, facial/tongue/throat swelling, SOB or lightheadedness with hypotension: Yes Has patient had a PCN reaction causing severe rash involving mucus membranes or skin necrosis: No Has patient had a PCN reaction that required hospitalization: No Has patient had a PCN reaction occurring within the last 10 years: No If all of the above answers are "NO", then may proceed with Cephalosporin use.    Sulfa Antibiotics Nausea Only   Dicyclomine Other (See Comments)    Caused confusion   Lactose Intolerance (Gi) Diarrhea   Hydrocodone Nausea Only    Family History  Problem Relation Age of Onset   Kidney disease Mother    Cancer Brother     Prior to Admission medications   Medication Sig Start Date End Date Taking? Authorizing Provider  acetaminophen (TYLENOL) 500 MG tablet Take 2 tablets (1,000 mg total) by mouth every 6 (six) hours as needed for mild pain. 07/06/15   Rama, Venetia Maxon, MD  amiodarone (PACERONE) 200 MG tablet 1 tab (200 mg total) by mouth twice daily for 1 week followed by 1 tab (200 mg total) by mouth daily thereafter. Patient taking differently: Take 200 mg by mouth daily. 06/25/20   Burnell Blanks, MD  aspirin EC 81 MG tablet Take 81 mg by mouth every morning.    [provider]  beta carotene w/minerals (OCUVITE) tablet Take 1 tablet by mouth every morning.    [provider]  cholecalciferol (VITAMIN D3) 25 MCG (1000 UNIT) tablet Take 1,000 Units by mouth every morning.    [provider]  clonazePAM (KLONOPIN) 0.5 MG tablet Take 0.5 tablets (0.25 mg total) by mouth at bedtime. 08/18/20   Caren Griffins, MD  furosemide (LASIX) 40 MG tablet Take 20 mg by mouth every morning.    [provider]   HYDROcodone-acetaminophen (NORCO/VICODIN) 5-325 MG tablet Take 1 tablet by mouth every 6 (six) hours as needed for moderate pain. Patient not taking: Reported on 10/03/2020 08/18/20   Caren Griffins, MD  levothyroxine (SYNTHROID) 25 MCG tablet Take 25 mcg by mouth daily. 09/09/20   [provider]  melatonin 5 MG TABS Take 5 mg by mouth at bedtime.    [provider]  nitrofurantoin, macrocrystal-monohydrate, (MACROBID) 100 MG capsule Take 100 mg by mouth 2 (two) times daily. 09/22/20   [provider]  nitroGLYCERIN (NITROSTAT) 0.4 MG SL tablet Place 0.4 mg under the tongue every 5 (five) minutes as needed for chest pain.    [provider]  ondansetron (ZOFRAN ODT) 8 MG disintegrating tablet Take 1 tablet (8 mg  total) by mouth every 8 (eight) hours as needed for nausea. 10/03/20   Varney Biles, MD  Polyethyl Glycol-Propyl Glycol (SYSTANE) 0.4-0.3 % GEL ophthalmic gel Place 1 drop into both eyes at bedtime.     [provider]  sertraline (ZOLOFT) 25 MG tablet Take 25 mg by mouth at bedtime. 10/28/19   [provider]    Physical Exam: Constitutional: Moderately built and nourished. Vitals:   10/22/20 0415 10/22/20 0430 10/22/20 0445 10/22/20 0500  BP: (!) 157/98 (!) 150/49 131/68 (!) 157/74  Pulse: 64 63 60 68  Resp: 18 20 (!) 22 17  Temp:      TempSrc:      SpO2: 90% 91% 91% 91%   Eyes: Anicteric no pallor. ENMT: No discharge from the ears eyes nose and mouth. Neck: No mass felt.  No neck rigidity. Respiratory: No rhonchi or crepitations. Cardiovascular: S1-S2 heard. Abdomen: Soft nontender bowel sound present.  Colostomy bag seen.   Musculoskeletal: No edema. Skin: No rash. Neurologic: Alert awake oriented to time place and person.  Moves all extremities. Psychiatric: Appears normal.  Normal affect.   Labs on Admission: I have personally reviewed following labs and imaging studies  CBC: Recent Labs  Lab 10/21/20 2310   WBC 7.1  NEUTROABS 4.2  HGB 13.4  HCT 41.9  MCV 96.5  PLT [id number]   Basic Metabolic Panel: Recent Labs  Lab 10/21/20 2126  NA 135  K 4.7  CL 98  CO2 26  GLUCOSE 98  BUN 19  CREATININE 1.12*  CALCIUM 9.5   GFR: CrCl cannot be calculated (Unknown ideal weight.). Liver Function Tests: Recent Labs  Lab 10/21/20 2126  AST 29  ALT 13  ALKPHOS 81  BILITOT 0.6  PROT 7.0  ALBUMIN 3.2*   No results for input(s): LIPASE, AMYLASE in the last 168 hours. No results for input(s): AMMONIA in the last 168 hours. Coagulation Profile: No results for input(s): INR, PROTIME in the last 168 hours. Cardiac Enzymes: No results for input(s): CKTOTAL, CKMB, CKMBINDEX, TROPONINI in the last 168 hours. BNP (last 3 results) No results for input(s): PROBNP in the last 8760 hours. HbA1C: No results for input(s): HGBA1C in the last 72 hours. CBG: No results for input(s): GLUCAP in the last 168 hours. Lipid Profile: No results for input(s): CHOL, HDL, LDLCALC, TRIG, CHOLHDL, LDLDIRECT in the last 72 hours. Thyroid Function Tests: No results for input(s): TSH, T4TOTAL, FREET4, T3FREE, THYROIDAB in the last 72 hours. Anemia Panel: No results for input(s): VITAMINB12, FOLATE, FERRITIN, TIBC, IRON, RETICCTPCT in the last 72 hours. Urine analysis:    Component Value Date/Time   COLORURINE YELLOW 10/22/2020 0104   APPEARANCEUR HAZY (A) 10/22/2020 0104   LABSPEC 1.017 10/22/2020 0104   PHURINE 6.0 10/22/2020 0104   GLUCOSEU NEGATIVE 10/22/2020 0104   HGBUR NEGATIVE 10/22/2020 0104   BILIRUBINUR NEGATIVE 10/22/2020 0104   KETONESUR NEGATIVE 10/22/2020 0104   PROTEINUR NEGATIVE 10/22/2020 0104   UROBILINOGEN 0.2 10/01/2014 1628   NITRITE POSITIVE (A) 10/22/2020 0104   LEUKOCYTESUR LARGE (A) 10/22/2020 0104   Sepsis Labs: '@LABRCNTIP'$ (procalcitonin:4,lacticidven:4) ) Recent Results (from the past 240 hour(s))  Resp Panel by RT-PCR (Flu A&B, Covid) Nasopharyngeal Swab     Status: None    Collection Time: 10/22/20 12:11 AM   Specimen: Nasopharyngeal Swab; Nasopharyngeal(NP) swabs in vial transport medium  Result Value Ref Range Status   SARS Coronavirus 2 by RT PCR NEGATIVE NEGATIVE Final    Comment: (NOTE) SARS-CoV-2 target nucleic acids are  NOT DETECTED.  The SARS-CoV-2 RNA is generally detectable in upper respiratory specimens during the acute phase of infection. The lowest concentration of SARS-CoV-2 viral copies this assay can detect is 138 copies/mL. A negative result does not preclude SARS-Cov-2 infection and should not be used as the sole basis for treatment or other patient management decisions. A negative result may occur with  improper specimen collection/handling, submission of specimen other than nasopharyngeal swab, presence of viral mutation(s) within the areas targeted by this assay, and inadequate number of viral copies(<138 copies/mL). A negative result must be combined with clinical observations, patient history, and epidemiological information. The expected result is Negative.  Fact Sheet for Patients:  EntrepreneurPulse.com.au  Fact Sheet for Healthcare Providers:  IncredibleEmployment.be  This test is no t yet approved or cleared by the [place] FDA and  has been authorized for detection and/or diagnosis of SARS-CoV-2 by FDA under an Emergency Use Authorization (EUA). This EUA will remain  in effect (meaning this test can be used) for the duration of the COVID-19 declaration under Section 564(b)(1) of the Act, 21 U.S.C.section 360bbb-3(b)(1), unless the authorization is terminated  or revoked sooner.       Influenza A by PCR NEGATIVE NEGATIVE Final   Influenza B by PCR NEGATIVE NEGATIVE Final    Comment: (NOTE) The Xpert Xpress SARS-CoV-2/FLU/RSV plus assay is intended as an aid in the diagnosis of influenza from Nasopharyngeal swab specimens and should not be used as a sole basis for treatment.  Nasal washings and aspirates are unacceptable for Xpert Xpress SARS-CoV-2/FLU/RSV testing.  Fact Sheet for Patients: EntrepreneurPulse.com.au  Fact Sheet for Healthcare Providers: IncredibleEmployment.be  This test is not yet approved or cleared by the [place] FDA and has been authorized for detection and/or diagnosis of SARS-CoV-2 by FDA under an Emergency Use Authorization (EUA). This EUA will remain in effect (meaning this test can be used) for the duration of the COVID-19 declaration under Section 564(b)(1) of the Act, 21 U.S.C. section 360bbb-3(b)(1), unless the authorization is terminated or revoked.  Performed at [facility], [place] [address] [place], [place] [address]      Radiological Exams on Admission: DG Chest Port 1 View  Result Date: 10/21/2020 CLINICAL DATA:  Weakness. EXAM: PORTABLE CHEST 1 VIEW COMPARISON:  August 12, 2020. FINDINGS: Stable cardiomediastinal silhouette. Stable right midlung subsegmental atelectasis or scarring is noted. Mild bibasilar subsegmental atelectasis is noted. The visualized skeletal structures are unremarkable. IMPRESSION: Mild bibasilar subsegmental atelectasis. Stable right midlung subsegmental atelectasis or scarring. Aortic Atherosclerosis (ICD10-I70.0). Electronically Signed   By: Marijo Conception M.D.   On: 10/21/2020 21:49      Assessment/Plan Principal Problem:   Weakness generalized Active Problems:   CAD (coronary artery disease)   HTN (hypertension)   PAF (paroxysmal atrial fibrillation) (HCC)   Acute lower UTI    Generalized weakness shaking spells with hypothermia and UTI with dysuria concerning for possible developing sepsis for which we will get blood cultures start antibiotics for UTI.  In addition since patient is hypothermic we will check TSH and cortisol levels. Shaking spells could be from rigors and presently hypothermic checking blood cultures TSH and cortisol  levels. CAD status post PCI denies any chest pain.  On aspirin. History of atrial flutter on amiodarone. History of diastolic CHF takes Lasix which I am holding for now since patient has been a weakness and creatinine is mildly increased. History of rectal cancer status post hemicolectomy and colostomy.   DVT prophylaxis: Heparin.  Code Status: DNR. Family Communication: Patient's niece. Disposition Plan: To be determined. Consults called: None. Admission status: Observation.   Rise Patience MD [facility] Pager [phone].  If 7PM-7AM, please contact night-coverage [email or web address] Password [email or web address]  10/22/2020, 5:06 AM

## 2020-10-22 NOTE — ED Notes (Signed)
Pt shaking, asking for MD to see how bad shaking is, Dr. Dayna Barker to bedside.

## 2020-10-23 LAB — BLOOD CULTURE ID PANEL (REFLEXED) - BCID2

## 2020-10-23 LAB — URINE CULTURE: Culture: <10000 — AB

## 2020-10-23 MED ORDER — SODIUM CHLORIDE 0.9 % IV SOLN
1.0000 g | Freq: Once | INTRAVENOUS | Status: AC
  Administered 2020-10-23: 1 g via INTRAVENOUS
  Filled 2020-10-23: qty 10

## 2020-10-23 MED ORDER — ONDANSETRON HCL 4 MG/2ML IJ SOLN
INTRAMUSCULAR | Status: AC
  Administered 2020-10-23: 4 mg via INTRAVENOUS
  Filled 2020-10-23: qty 2

## 2020-10-23 MED ORDER — FUROSEMIDE 20 MG PO TABS
20.0000 mg | ORAL_TABLET | Freq: Every day | ORAL | Status: DC
  Administered 2020-10-24 – 2020-10-25 (×2): 20 mg via ORAL
  Filled 2020-10-23 (×2): qty 1

## 2020-10-23 MED ORDER — MELATONIN 5 MG PO TABS
5.0000 mg | ORAL_TABLET | Freq: Every day | ORAL | Status: DC
  Administered 2020-10-23 – 2020-10-26 (×4): 5 mg via ORAL
  Filled 2020-10-23 (×4): qty 1

## 2020-10-23 MED ORDER — FUROSEMIDE 10 MG/ML IJ SOLN
40.0000 mg | Freq: Once | INTRAMUSCULAR | Status: AC
  Administered 2020-10-23: 40 mg via INTRAVENOUS
  Filled 2020-10-23: qty 4

## 2020-10-23 MED ORDER — ONDANSETRON HCL 4 MG/2ML IJ SOLN
4.0000 mg | Freq: Three times a day (TID) | INTRAMUSCULAR | Status: DC | PRN
  Administered 2020-10-25: 4 mg via INTRAVENOUS
  Filled 2020-10-23: qty 2

## 2020-10-23 MED ORDER — SODIUM CHLORIDE 0.9 % IV SOLN
INTRAVENOUS | Status: DC | PRN
  Administered 2020-10-23: 1000 mL via INTRAVENOUS

## 2020-10-23 MED ORDER — SODIUM CHLORIDE 0.9 % IV SOLN
2.0000 g | INTRAVENOUS | Status: AC
  Administered 2020-10-24 – 2020-10-26 (×3): 2 g via INTRAVENOUS
  Filled 2020-10-23 (×3): qty 20

## 2020-10-23 MED ORDER — DOCUSATE SODIUM 100 MG PO CAPS
100.0000 mg | ORAL_CAPSULE | Freq: Every day | ORAL | Status: DC
  Administered 2020-10-23 – 2020-10-24 (×2): 100 mg via ORAL
  Filled 2020-10-23 (×2): qty 1

## 2020-10-23 NOTE — Evaluation (Signed)
Occupational Therapy Evaluation Patient Details Name: [name] MRN: [id number] DOB: [birth year] Today's Date: 10/23/2020    History of Present Illness Pt is 87 yo female admitted on 10/21/20 with weakness, shaking spells with hypothermia, and UTI.  She has hx including but not limited to CAD with stentis, CHF, hypothryoidism, a flutter, rectal CA with hemicolectomy and colostomy.   Clinical Impression   Pt lives in an ALF and ambulates with a RW. She is assisted for bathing and dressing, but toilets, grooms and feeds herself. Pt presents with anxiety about falling, generalized weakness and poor standing balance. She requires min to total assist for ADL. Pt will need post acute rehab in SNF prior to return home. Will follow acutely.    Follow Up Recommendations  SNF;Supervision/Assistance - 24 hour    Equipment Recommendations  None recommended by OT    Recommendations for Other Services       Precautions / Restrictions Precautions Precautions: Fall Precaution Comments: urinary incontinence      Mobility Bed Mobility               General bed mobility comments: pt seated at EOB eating lunch upon arrival    Transfers Overall transfer level: Needs assistance Equipment used: 1 person hand held assist Transfers: Sit to/from Stand Sit to Stand: Mod assist;Min assist         General transfer comment: mod assist to stand intially from bed, min from chair 2 more trials    Balance Overall balance assessment: Needs assistance   Sitting balance-Leahy Scale: Fair     Standing balance support: Bilateral upper extremity supported Standing balance-Leahy Scale: Zero                             ADL either performed or assessed with clinical judgement   ADL Overall ADL's : Needs assistance/impaired Eating/Feeding: Set up;Sitting   Grooming: Brushing hair;Sitting;Minimal assistance;Wash/dry hands;Wash/dry face;Set up   Upper Body Bathing: Minimal  assistance   Lower Body Bathing: Total assistance;Sit to/from stand   Upper Body Dressing : Minimal assistance;Sitting   Lower Body Dressing: Total assistance;Sitting/lateral leans   Toilet Transfer: Moderate assistance;Stand-pivot;BSC   Toileting- Clothing Manipulation and Hygiene: Total assistance;Sit to/from stand;+2 for physical assistance               Vision Baseline Vision/History: 1 Wears glasses Patient Visual Report: No change from baseline       Perception     Praxis      Pertinent Vitals/Pain Pain Assessment: Faces Faces Pain Scale: No hurt     Hand Dominance Right   Extremity/Trunk Assessment Upper Extremity Assessment Upper Extremity Assessment: Generalized weakness (arthritic changes)   Lower Extremity Assessment Lower Extremity Assessment: Defer to PT evaluation   Cervical / Trunk Assessment Cervical / Trunk Assessment: Kyphotic   Communication Communication Communication: HOH;Other (comment) (with hearing aids)   Cognition Arousal/Alertness: Awake/alert Behavior During Therapy: Anxious Overall Cognitive Status: Within Functional Limits for tasks assessed                                 General Comments: pleasant and grateful, calls for help appropriately, anxious about falling   General Comments       Exercises     Shoulder Instructions      Home Living Family/patient expects to be discharged to:: Skilled nursing facility  Additional Comments: showers 1-2x/wk with aide; lives at [facility]      Prior Functioning/Environment Level of Independence: Needs assistance  Gait / Transfers Assistance Needed: walks with RW; could walk up and down halls at [facility] Needed: Pt had some assist with ADLs except able to do toileting on her own.            OT Problem List: Decreased strength;Impaired balance (sitting and/or standing);Decreased  activity tolerance;Decreased knowledge of use of DME or AE      OT Treatment/Interventions: Self-care/ADL training;DME and/or AE instruction;Therapeutic activities;Patient/family education;Balance training    OT Goals(Current goals can be found in the care plan section) Acute Rehab OT Goals Patient Stated Goal: walk without fearing falling OT Goal Formulation: With patient Time For Goal Achievement: 11/06/20 Potential to Achieve Goals: Good ADL Goals Pt Will Perform Grooming: standing;with min assist Pt Will Perform Upper Body Bathing: with supervision;sitting Pt Will Perform Upper Body Dressing: with set-up;sitting Pt Will Transfer to Toilet: with min assist;ambulating;bedside commode Pt Will Perform Toileting - Clothing Manipulation and hygiene: with min assist;sit to/from stand  OT Frequency: Min 2X/week   Barriers to D/C:            Co-evaluation              AM-PAC OT "6 Clicks" Daily Activity     Outcome Measure Help from another person eating meals?: A Little Help from another person taking care of personal grooming?: A Lot Help from another person toileting, which includes using toliet, bedpan, or urinal?: Total Help from another person bathing (including washing, rinsing, drying)?: A Lot Help from another person to put on and taking off regular upper body clothing?: A Little Help from another person to put on and taking off regular lower body clothing?: Total 6 Click Score: 12   End of Session Equipment Utilized During Treatment: Gait belt Nurse Communication: Mobility status  Activity Tolerance: Patient tolerated treatment well Patient left: in chair;with call bell/phone within reach;with chair alarm set  OT Visit Diagnosis: Unsteadiness on feet (R26.81);Other abnormalities of gait and mobility (R26.89);Muscle weakness (generalized) (M62.81)                Time: 1330-1400 OT Time Calculation (min): 30 min Charges:  OT General Charges $OT Visit: 1 Visit OT  Evaluation $OT Eval Moderate Complexity: 1 Mod OT Treatments $Self Care/Home Management : 8-22 mins  Nestor Lewandowsky, OTR/L Acute Rehabilitation Services Pager: [phone] Office: [phone]   Malka So 10/23/2020, 3:19 PM

## 2020-10-23 NOTE — Progress Notes (Signed)
PHARMACY - PHYSICIAN COMMUNICATION CRITICAL VALUE ALERT - BLOOD CULTURE IDENTIFICATION (BCID)  YALINI DUPLESSIS is an 87 y.o. female who presented to [facility] on 10/21/2020 with a chief complaint of weakness and UTI  Assessment:  Concern for UTI On ceftriaxone 1gm q24hr 1 out of 4 bottles + for staph species in the blood (most likely contaminant)  Name of physician (or Provider) Contacted: Dr. Candiss Norse  Current antibiotics: Ceftriaxone 1gm q24hr  Changes to prescribed antibiotics recommended:  Patient is on recommended antibiotics - No changes needed If patient acutely worsens, may consider escalation of therapy  Donnald Garre, PharmD Clinical Pharmacist  Please check AMION for all [place] numbers After 10:00 PM, call [place]

## 2020-10-23 NOTE — Progress Notes (Signed)
PROGRESS NOTE                                                                                                                                                                                                             Patient Demographics:    [name], is a 87 y.o. female, DOB - 03-25-1916, [id number]  Outpatient Primary MD for the patient is Leonard Downing, MD    LOS - 1  Admit date - 10/21/2020    Chief Complaint  Patient presents with   Weakness       Brief Narrative (HPI from H&P)  - DAYLIN RABURN is a 87 y.o. female with history of CAD status post stenting, diastolic CHF, hypothyroidism, atrial flutter history of rectal cancer status post hemicolectomy and colostomy was brought to the ER after patient was feeling weak tired and poor appetite.  Patient states she has been having dysuria with on antibiotics and also developed some diarrhea, she was diagnosed with UTI and admitted.   Subjective:    [place] Charity today has, No headache, No chest pain, No abdominal pain - No Nausea, No new weakness tingling or numbness, no SOB.   Assessment  & Plan :     UTI causing weakness -initially there was concern sepsis but he says that clinically improved, positive chronic 1 to empiric IV antibiotics which will be continued.  Continue supportive care.  Baseline she is wheelchair-bound.  2.  CAD s/p PCI - on ASA for secondary prevention.  3. Chr. Diastolic CHF - stable, mild chronic Pl. Effusions, home dose Lasix resumed.  4.  History of paroxysmal atrial fibrillation.  Continue amiodarone.  CHA2DS2-VASc score of greater than 3 but not long-term anticoagulation.   5.  History of rectal cancer s/p hemicolectomy and colostomy.  Supportive care.  6.  Hypothyroidism.  Continue home dose Synthroid.  Stable TSH.      Condition - Fair  Family Communication  :  None present  Code Status :  Full  Consults  :   None  PUD Prophylaxis : None   Procedures  :     CT - Moderate right pleural effusion and small left pleural effusion. This is stable or improved on the right since prior study. Cardiomegaly, coronary artery disease, aortic atherosclerosis. Left lower quadrant ostomy, unchanged. No evidence of bowel  obstruction. No acute findings.      Disposition Plan  :    Status is: Inpatient  Remains inpatient appropriate because:IV treatments appropriate due to intensity of illness or inability to take PO  Dispo: The patient is from: SNF              Anticipated d/c is to: SNF              Patient currently is not medically stable to d/c.   Difficult to place patient No  DVT Prophylaxis  :    heparin injection 5,000 Units Start: 10/22/20 1400    Lab Results  Component Value Date   PLT 242 10/22/2020    Diet :  Diet Order             Diet Heart Room service appropriate? Yes; Fluid consistency: Thin  Diet effective now                    Inpatient Medications  Scheduled Meds:  amiodarone  200 mg Oral Daily   artificial tears   Both Eyes QHS   aspirin EC  81 mg Oral q morning   clonazePAM  0.25 mg Oral QHS   docusate sodium  100 mg Oral QHS   heparin  5,000 Units Subcutaneous Q8H   levothyroxine  25 mcg Oral Daily   melatonin  5 mg Oral QHS   sertraline  25 mg Oral QHS   Continuous Infusions:  sodium chloride 1,000 mL (10/23/20 0824)   cefTRIAXone (ROCEPHIN)  IV 1 g (10/23/20 0824)   PRN Meds:.sodium chloride, acetaminophen **OR** [DISCONTINUED] acetaminophen, nitroGLYCERIN  Antibiotics  :    Anti-infectives (From admission, onward)    Start     Dose/Rate Route Frequency Ordered Stop   10/22/20 0800  cefTRIAXone (ROCEPHIN) 1 g in sodium chloride 0.9 % 100 mL IVPB        1 g 200 mL/hr over 30 Minutes Intravenous Every 24 hours 10/22/20 0505     10/22/20 0145  ciprofloxacin (CIPRO) IVPB 400 mg        400 mg 200 mL/hr over 60 Minutes Intravenous  Once 10/22/20  0141 10/22/20 0307        Time Spent in minutes  30   Lala Lund M.D on 10/23/2020 at 11:25 AM  To page go to [email or web address]      See all Orders from today for further details    Objective:   Vitals:   10/23/20 0336 10/23/20 0750 10/23/20 0956 10/23/20 1027  BP: (!) 130/58 (!) 134/50  (!) 131/45  Pulse: 66 61  (!) 59  Resp: (!) '23 18  16  '$ Temp: 98.1 F (36.7 C) 98.9 F (37.2 C)  97.8 F (36.6 C)  TempSrc: Oral Oral Oral Oral  SpO2: 94% 96%  98%    Wt Readings from Last 3 Encounters:  10/03/20 43.1 kg  08/12/20 42.6 kg  07/31/20 42.6 kg     Intake/Output Summary (Last 24 hours) at 10/23/2020 1125 Last data filed at 10/23/2020 0958 Gross per 24 hour  Intake 360 ml  Output 350 ml  Net 10 ml     Physical Exam  Awake Alert, No new F.N deficits, Normal affect [place].AT,PERRAL Supple Neck,No JVD, No cervical lymphadenopathy appriciated.  Symmetrical Chest wall movement, Good air movement bilaterally, CTAB RRR,No Gallops,Rubs or new Murmurs, No Parasternal Heave +ve B.Sounds, Abd Soft, No tenderness, Colostomy in place No Cyanosis, Clubbing or edema, No new Rash or bruise  Data Review:    CBC Recent Labs  Lab 10/21/20 2310 10/22/20 0617  WBC 7.1 8.1  HGB 13.4 13.2  HCT 41.9 40.4  PLT 228 242  MCV 96.5 94.6  MCH 30.9 30.9  MCHC 32.0 32.7  RDW 13.8 14.0  LYMPHSABS 2.2  --   MONOABS 0.5  --   EOSABS 0.1  --   BASOSABS 0.0  --     Recent Labs  Lab 10/21/20 2126 10/22/20 0617 10/22/20 0620  NA 135  --   --   K 4.7  --   --   CL 98  --   --   CO2 26  --   --   GLUCOSE 98  --   --   BUN 19  --   --   CREATININE 1.12* 0.93  --   CALCIUM 9.5  --   --   AST 29  --   --   ALT 13  --   --   ALKPHOS 81  --   --   BILITOT 0.6  --   --   ALBUMIN 3.2*  --   --   TSH  --   --  7.682*    ------------------------------------------------------------------------------------------------------------------ No results for input(s): CHOL, HDL,  LDLCALC, TRIG, CHOLHDL, LDLDIRECT in the last 72 hours.  Lab Results  Component Value Date   HGBA1C 5.4 02/23/2017   ------------------------------------------------------------------------------------------------------------------ Recent Labs    10/22/20 0620  TSH 7.682*    Cardiac Enzymes No results for input(s): CKMB, TROPONINI, MYOGLOBIN in the last 168 hours.  Invalid input(s): CK ------------------------------------------------------------------------------------------------------------------    Component Value Date/Time   BNP 266.2 (H) 06/11/2020 1355      Radiology Reports CT ABDOMEN PELVIS W CONTRAST  Result Date: 10/03/2020 CLINICAL DATA:  Abdominal pain EXAM: CT ABDOMEN AND PELVIS WITH CONTRAST TECHNIQUE: Multidetector CT imaging of the abdomen and pelvis was performed using the standard protocol following bolus administration of intravenous contrast. CONTRAST:  55m OMNIPAQUE IOHEXOL 300 MG/ML  SOLN COMPARISON:  05/02/2020 FINDINGS: Lower chest: Mild cardiomegaly. Dense valvular calcifications, most pronounced in the mitral valve. Coronary artery and aortic calcifications. Moderate right pleural effusion and small left pleural effusion. Probable atelectasis in the lower lobes. Hepatobiliary: 4.1 cm cyst in the right hepatic dome appears benign and stable. No suspicious focal hepatic abnormality. Gallbladder unremarkable. Pancreas: No focal abnormality or ductal dilatation. Spleen: Calcifications in the spleen compatible with old granulomas. Normal size. Adrenals/Urinary Tract: No adrenal abnormality. No focal renal abnormality. No stones or hydronephrosis. Urinary bladder is unremarkable. Stomach/Bowel: Sigmoid diverticulosis. No active diverticulitis. Bowel decompressed. No bowel obstruction. Left lower quadrant ostomy again noted, unchanged. Vascular/Lymphatic: Aortic atherosclerosis. No evidence of aneurysm or adenopathy. Reproductive: Prior hysterectomy.  No adnexal  masses. Other: No free fluid or free air. Musculoskeletal: Prior right hip replacement. Degenerative changes in the lumbar spine. No acute bony abnormality. IMPRESSION: Moderate right pleural effusion and small left pleural effusion. This is stable or improved on the right since prior study. Cardiomegaly, coronary artery disease, aortic atherosclerosis. Left lower quadrant ostomy, unchanged. No evidence of bowel obstruction. No acute findings. Electronically Signed   By: KRolm BaptiseM.D.   On: 10/03/2020 15:52   DG Chest Port 1 View  Result Date: 10/21/2020 CLINICAL DATA:  Weakness. EXAM: PORTABLE CHEST 1 VIEW COMPARISON:  August 12, 2020. FINDINGS: Stable cardiomediastinal silhouette. Stable right midlung subsegmental atelectasis or scarring is noted. Mild bibasilar subsegmental atelectasis is noted. The visualized skeletal structures are unremarkable. IMPRESSION: Mild  bibasilar subsegmental atelectasis. Stable right midlung subsegmental atelectasis or scarring. Aortic Atherosclerosis (ICD10-I70.0). Electronically Signed   By: Marijo Conception M.D.   On: 10/21/2020 21:49

## 2020-10-23 NOTE — TOC Initial Note (Addendum)
Transition of Care [facility]) - Initial/Assessment Note    Patient Details  Name: [name] MRN: [id number] Date of Birth: 04-08-1916  Transition of Care [facility]) CM/SW Contact:    Bary Castilla, LCSW Phone Number: [phone] 10/23/2020, 1:30 PM  Clinical Narrative:                  CSW met with pt to discuss the PT recommendation of SNF. Pt agreed that her mobility still needs improvement. She agreed to go to a SNF. Pt asked CSW to follow up with [name] who is a POA. Pt states she is vaccinated.  CSW called [name] and she explained that she would like for pt to go to SNF. She strongly prefers [facility] and does not want pt to go to [facility] due to a past experience.CSW provided [place] with Medicare.gov website. [name] explained that it is also a possibility that pt can return to [facility] with [facility] PT/OT however wants to pursue SNF first. CSW received permission to fax out to local facilities.  3:15PM- CSW received a message from [place] stating that pt is agreeable with [facility] as well. CSW sent referral to [facility].  TOC team will continue to assist with discharge planning needs.   .   Expected Discharge Plan: Skilled Nursing Facility Barriers to Discharge: Insurance Authorization, Continued Medical Work up, SNF Pending bed offer   Patient Goals and CMS Choice Patient states their goals for this hospitalization and ongoing recovery are:: To be able to walk on my own again CMS Medicare.gov Compare Post Acute Care list provided to:: Other (Comment Required) (website provided to [facility])    Expected Discharge Plan and Services Expected Discharge Plan: [place] arrangements for the past 2 months: [place] Passenger transport manager)                                      Prior Living Arrangements/Services Living arrangements for the past 2 months: [place] Passenger transport manager) Lives with:: Facility Resident Patient language and need  for interpreter reviewed:: Yes          Care giver support system in place?: Yes (comment)      Activities of Daily Living      Permission Sought/Granted   Permission granted to share information with : Yes, Verbal Permission Granted  Share Information with NAME: [name]  Permission granted to share info w AGENCY: SNFs  Permission granted to share info w Relationship: POA  Permission granted to share info w Contact Information: [phone]  Emotional Assessment Appearance:: Appears stated age Attitude/Demeanor/Rapport: Engaged Affect (typically observed): Accepting, Adaptable Orientation: : Oriented to Self, Oriented to Place, Oriented to  Time, Oriented to Situation      Admission diagnosis:  Lower urinary tract infectious disease [N39.0] Weakness generalized [R53.1] Weakness [R53.1] Sepsis ([place]) [A41.9] Patient Active Problem List   Diagnosis Date Noted   Weakness generalized 10/22/2020   Acute lower UTI 10/22/2020   Sepsis ([place]) 10/22/2020   Malnutrition of moderate degree 08/14/2020   Hip pain 08/13/2020   Closed fracture of right femur ([place]) 08/12/2020   Syncope 06/29/2020   Atrial fibrillation with RVR ([place]) 05/03/2020   Other cirrhosis of liver ([place]) 05/01/2020   Paroxysmal atrial fibrillation with rapid ventricular response (HCC) 04/30/2020   Chronic diastolic CHF (congestive heart failure) ([place]) 04/30/2020  Elevated LFTs 04/30/2020   AKI (acute kidney injury) ([place]) 04/30/2020   Atrial flutter ([place]) 04/27/2020   Secondary hypercoagulable state ([place]) 04/27/2020   Closed fracture of multiple pubic rami, left, initial encounter ([place]) 09/06/2019   Slurred speech 02/23/2017   PAF (paroxysmal atrial fibrillation) ([place]) 02/23/2017   Depression with anxiety 02/23/2017   TIA (transient ischemic attack)    Fracture, tibia and fibula 07/06/2015   Tibia/fibula fracture 07/03/2015   Lung nodule seen on imaging study 09/05/2013   Exposure to TB 09/05/2013   SOB  (shortness of breath) 10/02/2011   Vascular skin changes 07/17/2011   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 [place] [address] Phone: [phone] Fax: [phone]  [facility] [address] [place] [address] Phone: [phone] Fax: [phone]     Social Determinants of Health (SDOH) Interventions    Readmission Risk Interventions Readmission Risk Prevention Plan 05/04/2020  Transportation Screening Complete  PCP or Specialist Appt within 5-7 Days Complete  Home Care Screening Complete  Medication Review (RN CM) Complete  Some recent data might be hidden

## 2020-10-23 NOTE — NC FL2 (Signed)
[place] MEDICAID FL2 LEVEL OF CARE SCREENING TOOL     IDENTIFICATION  Patient Name: [name] Birthdate: October 29, 1916 Sex: female Admission Date (Current Location): 10/21/2020  [facility] and [place] Number:  Herbalist and Address:  [facility] [place] [address], [place], [place] [address]      Provider Number: [id number]  Attending Physician Name and Address:  Thurnell Lose, MD  Relative Name and Phone Number:  Thayer Headings [phone]    Current Level of Care: Hospital Recommended Level of Care: [place] Prior Approval Number:    Date Approved/Denied:   PASRR Number: [id number]  Discharge Plan: SNF    Current Diagnoses: Patient Active Problem List   Diagnosis Date Noted   Weakness generalized 10/22/2020   Acute lower UTI 10/22/2020   Sepsis ([place]) 10/22/2020   Malnutrition of moderate degree 08/14/2020   Hip pain 08/13/2020   Closed fracture of right femur ([place]) 08/12/2020   Syncope 06/29/2020   Atrial fibrillation with RVR ([place]) 05/03/2020   Other cirrhosis of liver ([place]) 05/01/2020   Paroxysmal atrial fibrillation with rapid ventricular response ([place]) 04/30/2020   Chronic diastolic CHF (congestive heart failure) ([place]) 04/30/2020   Elevated LFTs 04/30/2020   AKI (acute kidney injury) ([place]) 04/30/2020   Atrial flutter ([place]) 04/27/2020   Secondary hypercoagulable state ([place]) 04/27/2020   Closed fracture of multiple pubic rami, left, initial encounter ([place]) 09/06/2019   Slurred speech 02/23/2017   PAF (paroxysmal atrial fibrillation) ([place]) 02/23/2017   Depression with anxiety 02/23/2017   TIA (transient ischemic attack)    Fracture, tibia and fibula 07/06/2015   Tibia/fibula fracture 07/03/2015   Lung nodule seen on imaging study 09/05/2013   Exposure to TB 09/05/2013   SOB (shortness of breath) 10/02/2011   Vascular skin changes 07/17/2011   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 01/11/2011    Anorexia post-op 12/08/2010   HTN (hypertension) 11/03/2010   Dizziness 09/15/2010   CAD (coronary artery disease)    Hyperlipidemia    Rectal cancer, pT2uN0(pNX) s/p TEM partial proctectomy 09/07/2010    Orientation RESPIRATION BLADDER Height & Weight     Self, Time, Situation, Place  O2 (2L) Incontinent, External catheter Weight:   Height:     BEHAVIORAL SYMPTOMS/MOOD NEUROLOGICAL BOWEL NUTRITION STATUS      Continent Diet (See DC summary)  AMBULATORY STATUS COMMUNICATION OF NEEDS Skin   Limited Assist Verbally Normal                       Personal Care Assistance Level of Assistance  Bathing, Feeding, Dressing Bathing Assistance: Limited assistance Feeding assistance: Independent Dressing Assistance: Limited assistance     Functional Limitations Info  Sight, Hearing, Speech Sight Info: Impaired Hearing Info: Adequate Speech Info: Adequate    SPECIAL CARE FACTORS FREQUENCY  PT (By licensed PT), OT (By licensed OT)     PT Frequency: 5x per week OT Frequency: 5x per week            Contractures Contractures Info: Not present    Additional Factors Info  Code Status, Allergies Code Status Info: DNR Allergies Info: Effexor (Venlafaxine Hydrochloride), Penicillins, Sulfa Antibiotics, Dicyclomine, Lactose Intolerance (Gi), Hydrocodone           Current Medications (10/23/2020):  This is the current hospital active medication list Current Facility-Administered Medications  Medication Dose Route Frequency Provider Last Rate Last Admin   0.9 %  sodium chloride infusion  Intravenous PRN Thurnell Lose, MD 10 mL/hr at 10/23/20 0824 1,000 mL at 10/23/20 0824   acetaminophen (TYLENOL) tablet 650 mg  650 mg Oral Q6H PRN Rise Patience, MD       amiodarone (PACERONE) tablet 200 mg  200 mg Oral Daily Rise Patience, MD   200 mg at 10/23/20 [id number]   artificial tears (LACRILUBE) ophthalmic ointment   Both Eyes QHS Chotiner, Yevonne Aline, MD       aspirin EC  tablet 81 mg  81 mg Oral q morning Rise Patience, MD   81 mg at 10/23/20 0955   cefTRIAXone (ROCEPHIN) 1 g in sodium chloride 0.9 % 100 mL IVPB  1 g Intravenous Q24H Rise Patience, MD 200 mL/hr at 10/23/20 0824 1 g at 10/23/20 0824   clonazepam (KLONOPIN) disintegrating tablet 0.25 mg  0.25 mg Oral QHS Rise Patience, MD   0.25 mg at 10/22/20 2054   docusate sodium (COLACE) capsule 100 mg  100 mg Oral QHS Thurnell Lose, MD       [START ON 10/24/2020] furosemide (LASIX) tablet 20 mg  20 mg Oral Daily Thurnell Lose, MD       heparin injection 5,000 Units  5,000 Units Subcutaneous Q8H Rise Patience, MD   5,000 Units at 10/23/20 0600   levothyroxine (SYNTHROID) tablet 25 mcg  25 mcg Oral Daily Rise Patience, MD   25 mcg at 10/23/20 0600   melatonin tablet 5 mg  5 mg Oral QHS Thurnell Lose, MD       nitroGLYCERIN (NITROSTAT) SL tablet 0.4 mg  0.4 mg Sublingual Q5 min PRN Rise Patience, MD       sertraline (ZOLOFT) tablet 25 mg  25 mg Oral QHS Rise Patience, MD   25 mg at 10/22/20 2054     Discharge Medications: Please see discharge summary for a list of discharge medications.  Relevant Imaging Results:  Relevant Lab Results:   Additional Information SSN# [id number] pt has been vaccinated  Bary Castilla, LCSW

## 2020-10-24 LAB — CBC WITH DIFFERENTIAL/PLATELET
Abs Immature Granulocytes: 0.02 10*3/uL (ref 0.00–0.07)
Basophils Absolute: 0.1 10*3/uL (ref 0.0–0.1)
Basophils Relative: 1 %
Eosinophils Absolute: 0.5 10*3/uL (ref 0.0–0.5)
Eosinophils Relative: 8 %
HCT: 36.9 % (ref 36.0–46.0)
Hemoglobin: 11.8 g/dL — ABNORMAL LOW (ref 12.0–15.0)
Immature Granulocytes: 0 %
Lymphocytes Relative: 25 %
Lymphs Abs: 1.7 10*3/uL (ref 0.7–4.0)
MCH: 30.6 pg (ref 26.0–34.0)
MCHC: 32 g/dL (ref 30.0–36.0)
MCV: 95.6 fL (ref 80.0–100.0)
Monocytes Absolute: 0.7 10*3/uL (ref 0.1–1.0)
Monocytes Relative: 10 %
Neutro Abs: 3.8 10*3/uL (ref 1.7–7.7)
Neutrophils Relative %: 56 %
Platelets: 191 10*3/uL (ref 150–400)
RBC: 3.86 MIL/uL — ABNORMAL LOW (ref 3.87–5.11)
RDW: 14 % (ref 11.5–15.5)
WBC: 6.7 10*3/uL (ref 4.0–10.5)
nRBC: 0 % (ref 0.0–0.2)

## 2020-10-24 LAB — COMPREHENSIVE METABOLIC PANEL
ALT: 12 U/L (ref 0–44)
AST: 15 U/L (ref 15–41)
Albumin: 2.6 g/dL — ABNORMAL LOW (ref 3.5–5.0)
Alkaline Phosphatase: 64 U/L (ref 38–126)
Anion gap: 6 (ref 5–15)
BUN: 14 mg/dL (ref 8–23)
CO2: 28 mmol/L (ref 22–32)
Calcium: 9.1 mg/dL (ref 8.9–10.3)
Chloride: 103 mmol/L (ref 98–111)
Creatinine, Ser: 1.01 mg/dL — ABNORMAL HIGH (ref 0.44–1.00)
GFR, Estimated: 49 mL/min — ABNORMAL LOW (ref >60)
Glucose, Bld: 86 mg/dL (ref 70–99)
Potassium: 4 mmol/L (ref 3.5–5.1)
Sodium: 137 mmol/L (ref 135–145)
Total Bilirubin: 0.5 mg/dL (ref 0.3–1.2)
Total Protein: 6.2 g/dL — ABNORMAL LOW (ref 6.5–8.1)

## 2020-10-24 LAB — BRAIN NATRIURETIC PEPTIDE: B Natriuretic Peptide: 154.6 pg/mL — ABNORMAL HIGH (ref 0.0–100.0)

## 2020-10-24 LAB — MAGNESIUM: Magnesium: 1.8 mg/dL (ref 1.7–2.4)

## 2020-10-24 NOTE — Progress Notes (Signed)
PROGRESS NOTE                                                                                                                                                                                                             Patient Demographics:    [name], is a 87 y.o. female, DOB - Sep 23, 1916, [id number]  Outpatient Primary MD for the patient is Leonard Downing, MD    LOS - 2  Admit date - 10/21/2020    Chief Complaint  Patient presents with   Weakness       Brief Narrative (HPI from H&P)  - [name] is a 87 y.o. female with history of CAD status post stenting, diastolic CHF, hypothyroidism, atrial flutter history of rectal cancer status post hemicolectomy and colostomy was brought to the ER after patient was feeling weak tired and poor appetite.  Patient states she has been having dysuria with on antibiotics and also developed some diarrhea, she was diagnosed with UTI and admitted.   Subjective:   Patient in bed, appears comfortable, denies any headache, no fever, no chest pain or pressure, no shortness of breath , no abdominal pain. No new focal weakness.    Assessment  & Plan :     UTI causing weakness -initially there was concern sepsis but he says that clinically improved, positive chronic 1 to empiric IV antibiotics which will be continued.  Continue supportive care.  Baseline she is wheelchair-bound.  2.  CAD s/p PCI - on ASA for secondary prevention.  3. Chr. Diastolic CHF - stable, mild chronic Pl. Effusions, home dose Lasix resumed.  4.  History of paroxysmal atrial fibrillation.  Continue amiodarone.  CHA2DS2-VASc score of greater than 3 but not long-term anticoagulation.   5.  History of rectal cancer s/p hemicolectomy and colostomy.  Supportive care.  6.  Hypothyroidism.  Continue home dose Synthroid.  Stable TSH.      Condition - Fair  Family Communication  :  None  present  Code Status :  Full  Consults  :  None  PUD Prophylaxis : None   Procedures  :     CT - Moderate right pleural effusion and small left pleural effusion. This is stable or improved on the right since prior study. Cardiomegaly, coronary artery disease, aortic atherosclerosis. Left lower quadrant ostomy, unchanged.  No evidence of bowel obstruction. No acute findings.      Disposition Plan  :    Status is: Inpatient  Remains inpatient appropriate because:IV treatments appropriate due to intensity of illness or inability to take PO  Dispo: The patient is from: SNF              Anticipated d/c is to: SNF              Patient currently is not medically stable to d/c.   Difficult to place patient No  DVT Prophylaxis  :    heparin injection 5,000 Units Start: 10/22/20 1400    Lab Results  Component Value Date   PLT 191 10/24/2020    Diet :  Diet Order             Diet Heart Room service appropriate? Yes; Fluid consistency: Thin  Diet effective now                    Inpatient Medications  Scheduled Meds:  amiodarone  200 mg Oral Daily   artificial tears   Both Eyes QHS   aspirin EC  81 mg Oral q morning   clonazePAM  0.25 mg Oral QHS   docusate sodium  100 mg Oral QHS   furosemide  20 mg Oral Daily   heparin  5,000 Units Subcutaneous Q8H   levothyroxine  25 mcg Oral Daily   melatonin  5 mg Oral QHS   sertraline  25 mg Oral QHS   Continuous Infusions:  sodium chloride 10 mL/hr at 10/23/20 1600   cefTRIAXone (ROCEPHIN)  IV 2 g (10/24/20 0900)   PRN Meds:.sodium chloride, acetaminophen **OR** [DISCONTINUED] acetaminophen, nitroGLYCERIN, ondansetron (ZOFRAN) IV  Antibiotics  :    Anti-infectives (From admission, onward)    Start     Dose/Rate Route Frequency Ordered Stop   10/24/20 0800  cefTRIAXone (ROCEPHIN) 2 g in sodium chloride 0.9 % 100 mL IVPB        2 g 200 mL/hr over 30 Minutes Intravenous Every 24 hours 10/23/20 1545     10/23/20 1645   cefTRIAXone (ROCEPHIN) 1 g in sodium chloride 0.9 % 100 mL IVPB        1 g 200 mL/hr over 30 Minutes Intravenous  Once 10/23/20 1545 10/23/20 1900   10/22/20 0800  cefTRIAXone (ROCEPHIN) 1 g in sodium chloride 0.9 % 100 mL IVPB  Status:  Discontinued        1 g 200 mL/hr over 30 Minutes Intravenous Every 24 hours 10/22/20 0505 10/23/20 1545   10/22/20 0145  ciprofloxacin (CIPRO) IVPB 400 mg        400 mg 200 mL/hr over 60 Minutes Intravenous  Once 10/22/20 0141 10/22/20 0307        Time Spent in minutes  30   Lala Lund M.D on 10/24/2020 at 11:32 AM  To page go to [email or web address]      See all Orders from today for further details    Objective:   Vitals:   10/24/20 0000 10/24/20 0016 10/24/20 0400 10/24/20 1100  BP: (!) 119/39 (!) 125/41 (!) 129/44 (!) 112/43  Pulse:    61  Resp: '17 18 18 17  '$ Temp:  97.8 F (36.6 C) 97.9 F (36.6 C) 98 F (36.7 C)  TempSrc:  Oral Oral Oral  SpO2:  98% 95% 95%  Weight:   48.1 kg     Wt Readings from Last 3 Encounters:  10/24/20 48.1  kg  10/03/20 43.1 kg  08/12/20 42.6 kg     Intake/Output Summary (Last 24 hours) at 10/24/2020 1132 Last data filed at 10/24/2020 0445 Gross per 24 hour  Intake 320.9 ml  Output 800 ml  Net -479.1 ml     Physical Exam  Awake Alert, No new F.N deficits, Normal affect [place].AT,PERRAL Supple Neck,No JVD, No cervical lymphadenopathy appriciated.  Symmetrical Chest wall movement, Good air movement bilaterally, CTAB RRR,No Gallops, Rubs or new Murmurs, No Parasternal Heave +ve B.Sounds, Abd Soft, No tenderness,  Colostomy in place No Cyanosis, Clubbing or edema, No new Rash or bruise    Data Review:    CBC Recent Labs  Lab 10/21/20 2310 10/22/20 0617 10/24/20 0142  WBC 7.1 8.1 6.7  HGB 13.4 13.2 11.8*  HCT 41.9 40.4 36.9  PLT 228 242 191  MCV 96.5 94.6 95.6  MCH 30.9 30.9 30.6  MCHC 32.0 32.7 32.0  RDW 13.8 14.0 14.0  LYMPHSABS 2.2  --  1.7  MONOABS 0.5  --  0.7  EOSABS 0.1  --   0.5  BASOSABS 0.0  --  0.1    Recent Labs  Lab 10/21/20 2126 10/22/20 0617 10/22/20 0620 10/24/20 0142  NA 135  --   --  137  K 4.7  --   --  4.0  CL 98  --   --  103  CO2 26  --   --  28  GLUCOSE 98  --   --  86  BUN 19  --   --  14  CREATININE 1.12* 0.93  --  1.01*  CALCIUM 9.5  --   --  9.1  AST 29  --   --  15  ALT 13  --   --  12  ALKPHOS 81  --   --  64  BILITOT 0.6  --   --  0.5  ALBUMIN 3.2*  --   --  2.6*  MG  --   --   --  1.8  TSH  --   --  7.682*  --   BNP  --   --   --  154.6*    ------------------------------------------------------------------------------------------------------------------ No results for input(s): CHOL, HDL, LDLCALC, TRIG, CHOLHDL, LDLDIRECT in the last 72 hours.  Lab Results  Component Value Date   HGBA1C 5.4 02/23/2017   ------------------------------------------------------------------------------------------------------------------ Recent Labs    10/22/20 0620  TSH 7.682*    Cardiac Enzymes No results for input(s): CKMB, TROPONINI, MYOGLOBIN in the last 168 hours.  Invalid input(s): CK ------------------------------------------------------------------------------------------------------------------    Component Value Date/Time   BNP 154.6 (H) 10/24/2020 0142      Radiology Reports CT ABDOMEN PELVIS W CONTRAST  Result Date: 10/03/2020 CLINICAL DATA:  Abdominal pain EXAM: CT ABDOMEN AND PELVIS WITH CONTRAST TECHNIQUE: Multidetector CT imaging of the abdomen and pelvis was performed using the standard protocol following bolus administration of intravenous contrast. CONTRAST:  68m OMNIPAQUE IOHEXOL 300 MG/ML  SOLN COMPARISON:  05/02/2020 FINDINGS: Lower chest: Mild cardiomegaly. Dense valvular calcifications, most pronounced in the mitral valve. Coronary artery and aortic calcifications. Moderate right pleural effusion and small left pleural effusion. Probable atelectasis in the lower lobes. Hepatobiliary: 4.1 cm cyst in the  right hepatic dome appears benign and stable. No suspicious focal hepatic abnormality. Gallbladder unremarkable. Pancreas: No focal abnormality or ductal dilatation. Spleen: Calcifications in the spleen compatible with old granulomas. Normal size. Adrenals/Urinary Tract: No adrenal abnormality. No focal renal abnormality. No stones or hydronephrosis. Urinary bladder  is unremarkable. Stomach/Bowel: Sigmoid diverticulosis. No active diverticulitis. Bowel decompressed. No bowel obstruction. Left lower quadrant ostomy again noted, unchanged. Vascular/Lymphatic: Aortic atherosclerosis. No evidence of aneurysm or adenopathy. Reproductive: Prior hysterectomy.  No adnexal masses. Other: No free fluid or free air. Musculoskeletal: Prior right hip replacement. Degenerative changes in the lumbar spine. No acute bony abnormality. IMPRESSION: Moderate right pleural effusion and small left pleural effusion. This is stable or improved on the right since prior study. Cardiomegaly, coronary artery disease, aortic atherosclerosis. Left lower quadrant ostomy, unchanged. No evidence of bowel obstruction. No acute findings. Electronically Signed   By: Rolm Baptise M.D.   On: 10/03/2020 15:52   DG Chest Port 1 View  Result Date: 10/21/2020 CLINICAL DATA:  Weakness. EXAM: PORTABLE CHEST 1 VIEW COMPARISON:  August 12, 2020. FINDINGS: Stable cardiomediastinal silhouette. Stable right midlung subsegmental atelectasis or scarring is noted. Mild bibasilar subsegmental atelectasis is noted. The visualized skeletal structures are unremarkable. IMPRESSION: Mild bibasilar subsegmental atelectasis. Stable right midlung subsegmental atelectasis or scarring. Aortic Atherosclerosis (ICD10-I70.0). Electronically Signed   By: Marijo Conception M.D.   On: 10/21/2020 21:49

## 2020-10-25 LAB — CULTURE, BLOOD (ROUTINE X 2)

## 2020-10-25 NOTE — Care Management Important Message (Signed)
Important Message  Patient Details  Name: [name] MRN: [id number] Date of Birth: May 12, 1916   Medicare Important Message Given:  Yes     [name] [name] 10/25/2020, 3:23 PM

## 2020-10-25 NOTE — Evaluation (Signed)
Occupational Therapy Evaluation Patient Details Name: [name] MRN: [id number] DOB: December 17, 1916 Today's Date: 10/25/2020   History of Present Illness Pt is 87 yo female admitted on 10/21/20 with weakness, shaking spells with hypothermia, and UTI.  She has hx including but not limited to CAD with stentis, CHF, hypothryoidism, a flutter, rectal CA with hemicolectomy and colostomy.   Clinical Impression   Pt reporting having not slept well, declined OOB to chair, but participated in grooming at EOB and standing for repositioning and to ensure she was clean and dry. Pt reports having difficulty getting her breakfast down this morning, but denies having been nauseous. Pt noted to be on 2L 02 this date with Sp02 of 96%, but is not on home 02, removed with Sp02 dropping to 89%. RN and MD in room and aware.     Recommendations for follow up therapy are one component of a multi-disciplinary discharge planning process, led by the attending physician.  Recommendations may be updated based on patient status, additional functional criteria and insurance authorization.   Follow Up Recommendations  SNF;Supervision/Assistance - 24 hour    Equipment Recommendations  None recommended by OT    Recommendations for Other Services       Precautions / Restrictions Precautions Precautions: Fall Precaution Comments: urinary incontinence, watch 02      Mobility Bed Mobility Overal bed mobility: Needs Assistance Bed Mobility: Supine to Sit;Sit to Supine     Supine to sit: Min assist Sit to supine: Mod assist   General bed mobility comments: min assist to raise trunk, assist for LEs back into bed, HOB, increased time    Transfers Overall transfer level: Needs assistance Equipment used: 1 person hand held assist Transfers: Sit to/from Stand Sit to Stand: Mod assist         General transfer comment: assist to rise and steady from bed    Balance Overall balance assessment: Needs  assistance   Sitting balance-Leahy Scale: Fair     Standing balance support: Bilateral upper extremity supported Standing balance-Leahy Scale: Poor Standing balance comment: posterior bias, feet tend to slide forward in standing                           ADL either performed or assessed with clinical judgement   ADL Overall ADL's : Needs assistance/impaired     Grooming: Brushing hair;Oral care;Wash/dry hands;Wash/dry face;Sitting;Supervision/safety                       Toileting- Water quality scientist and Hygiene: Total assistance;Sit to/from stand;+2 for physical assistance               Vision         Perception     Praxis      Pertinent Vitals/Pain Pain Assessment: No/denies pain     Hand Dominance     Extremity/Trunk Assessment             Communication     Cognition Arousal/Alertness: Awake/alert Behavior During Therapy: WFL for tasks assessed/performed Overall Cognitive Status: Within Functional Limits for tasks assessed                                 General Comments: pt reporting fatigue, slept poorly   General Comments       Exercises     Shoulder Instructions      Home  Living                                          Prior Functioning/Environment                   OT Problem List:        OT Treatment/Interventions:      OT Goals(Current goals can be found in the care plan section) Acute Rehab OT Goals Patient Stated Goal: walk without fearing falling OT Goal Formulation: With patient Time For Goal Achievement: 11/06/20 Potential to Achieve Goals: Good  OT Frequency: Min 2X/week   Barriers to D/C:            Co-evaluation              AM-PAC OT "6 Clicks" Daily Activity     Outcome Measure Help from another person eating meals?: A Little Help from another person taking care of personal grooming?: A Lot Help from another person toileting, which includes  using toliet, bedpan, or urinal?: Total Help from another person bathing (including washing, rinsing, drying)?: A Lot Help from another person to put on and taking off regular upper body clothing?: A Lot Help from another person to put on and taking off regular lower body clothing?: Total 6 Click Score: 11   End of Session Equipment Utilized During Treatment: Oxygen;Gait belt Nurse Communication: Other (comment) (aware pt had trouble swallowing breakfast and 02 sats drop to 89% on RA)  Activity Tolerance: Patient limited by fatigue Patient left: in bed;with call bell/phone within reach;with bed alarm set;with nursing/sitter in room  OT Visit Diagnosis: Unsteadiness on feet (R26.81);Other abnormalities of gait and mobility (R26.89);Muscle weakness (generalized) (M62.81)                Time: [id number] OT Time Calculation (min): 18 min Charges:  OT General Charges $OT Visit: 1 Visit OT Treatments $Self Care/Home Management : 8-22 mins  Nestor Lewandowsky, OTR/L Acute Rehabilitation Services Pager: [phone] Office: [phone]  Malka So 10/25/2020, 9:12 AM

## 2020-10-25 NOTE — TOC Progression Note (Signed)
Transition of Care [facility]) - Progression Note    Patient Details  Name: [name] MRN: [id number] Date of Birth: 26-Apr-1916  Transition [facility] CM/SW Contact  Reece Agar, [place] Phone Number: 10/25/2020, 3:08 PM  Clinical Narrative:    CSW contacted Tracey at [facility] for bed availability per POA. Linus Orn confirmed a bed for pt but shared that there would be a $50 per day co-pay. CSW shared this information with pt POA who states she would like pt to return to [facility] with [facility].   CSW contacted Nanine Means to provide an update on pt DC tomorrow, admin was not available and VM was left.   Expected Discharge Plan: Skilled Nursing Facility Barriers to Discharge: Ship broker, Continued Medical Work up, SNF Pending bed offer  Expected Discharge Plan and Services Expected Discharge Plan: [place] arrangements for the past 2 months: [place]) Expected Discharge Date: 10/25/20                                     Social Determinants of Health ([place]) Interventions    Readmission Risk Interventions Readmission Risk Prevention Plan 05/04/2020  Transportation Screening Complete  PCP or Specialist Appt within 5-7 Days Complete  Home Care Screening Complete  Medication Review (RN CM) Complete  Some recent data might be hidden

## 2020-10-25 NOTE — Progress Notes (Signed)
PROGRESS NOTE                                                                                                                                                                                                             Patient Demographics:    [name], is a 87 y.o. female, DOB - [birth year], [id number]  Outpatient Primary MD for the patient is Leonard Downing, MD    LOS - 3  Admit date - 10/21/2020    Chief Complaint  Patient presents with   Weakness       Brief Narrative (HPI from H&P)  - OLUWADEMILADE Hamilton is a 87 y.o. female with history of CAD status post stenting, diastolic CHF, hypothyroidism, atrial flutter history of rectal cancer status post hemicolectomy and colostomy was brought to the ER after patient was feeling weak tired and poor appetite.  Patient states she has been having dysuria with on antibiotics and also developed some diarrhea, she was diagnosed with UTI and admitted.   Subjective:   Patient in bed, appears comfortable, denies any headache, no fever, no chest pain or pressure, no shortness of breath , no abdominal pain. No new focal weakness, she tells me that this morning she had problems swallowing waffles, which she rarely has but did not complete her breakfast due to that.   Assessment  & Plan :     UTI causing weakness -initially there was concern sepsis but he says that clinically improved, positive chronic 1 to empiric IV antibiotics which will be continued.  Continue supportive care.  Baseline she is wheelchair-bound.  2.  CAD s/p PCI - on ASA for secondary prevention.  3. Chr. Diastolic CHF - stable, mild chronic Pl. Effusions, home dose Lasix resumed.  4.  History of paroxysmal atrial fibrillation.  Continue amiodarone.  CHA2DS2-VASc score of greater than 3 but not long-term anticoagulation.   5.  History of rectal cancer s/p hemicolectomy and colostomy.  Supportive care.  6.   Hypothyroidism.  Continue home dose Synthroid.  Stable TSH.  7.  Dysphagia noted morning of 10/25/2020.  With solids.  For now soft food, speech eval.      Condition - Fair  Family Communication  :  None present  Code Status :  Full  Consults  :  None  PUD Prophylaxis : None  Procedures  :     CT - Moderate right pleural effusion and small left pleural effusion. This is stable or improved on the right since prior study. Cardiomegaly, coronary artery disease, aortic atherosclerosis. Left lower quadrant ostomy, unchanged. No evidence of bowel obstruction. No acute findings.      Disposition Plan  :    Status is: Inpatient  Remains inpatient appropriate because:IV treatments appropriate due to intensity of illness or inability to take PO  Dispo: The patient is from: SNF              Anticipated d/c is to: SNF              Patient currently is not medically stable to d/c.   Difficult to place patient No  DVT Prophylaxis  :    heparin injection 5,000 Units Start: 10/22/20 1400    Lab Results  Component Value Date   PLT 191 10/24/2020    Diet :  Diet Order             DIET SOFT Room service appropriate? Yes; Fluid consistency: Thin  Diet effective now                    Inpatient Medications  Scheduled Meds:  amiodarone  200 mg Oral Daily   artificial tears   Both Eyes QHS   aspirin EC  81 mg Oral q morning   clonazePAM  0.25 mg Oral QHS   docusate sodium  100 mg Oral QHS   heparin  5,000 Units Subcutaneous Q8H   levothyroxine  25 mcg Oral Daily   melatonin  5 mg Oral QHS   sertraline  25 mg Oral QHS   Continuous Infusions:  sodium chloride 10 mL/hr at 10/23/20 1600   cefTRIAXone (ROCEPHIN)  IV 2 g (10/25/20 0907)   PRN Meds:.sodium chloride, acetaminophen **OR** [DISCONTINUED] acetaminophen, nitroGLYCERIN, ondansetron (ZOFRAN) IV  Antibiotics  :    Anti-infectives (From admission, onward)    Start     Dose/Rate Route Frequency Ordered Stop    10/24/20 0800  cefTRIAXone (ROCEPHIN) 2 g in sodium chloride 0.9 % 100 mL IVPB        2 g 200 mL/hr over 30 Minutes Intravenous Every 24 hours 10/23/20 1545 10/27/20 0759   10/23/20 1645  cefTRIAXone (ROCEPHIN) 1 g in sodium chloride 0.9 % 100 mL IVPB        1 g 200 mL/hr over 30 Minutes Intravenous  Once 10/23/20 1545 10/23/20 1900   10/22/20 0800  cefTRIAXone (ROCEPHIN) 1 g in sodium chloride 0.9 % 100 mL IVPB  Status:  Discontinued        1 g 200 mL/hr over 30 Minutes Intravenous Every 24 hours 10/22/20 0505 10/23/20 1545   10/22/20 0145  ciprofloxacin (CIPRO) IVPB 400 mg        400 mg 200 mL/hr over 60 Minutes Intravenous  Once 10/22/20 0141 10/22/20 0307        Time Spent in minutes  30   Lala Lund M.D on 10/25/2020 at 10:17 AM  To page go to [email or web address]      See all Orders from today for further details    Objective:   Vitals:   10/24/20 0400 10/24/20 1100 10/24/20 2030 10/25/20 0400  BP: (!) 129/44 (!) 112/43 (!) 140/40   Pulse:  61 (!) 59   Resp: '18 17 17   '$ Temp: 97.9 F (36.6 C) 98 F (36.7 C) 97.9 F (36.6 C)  98.1 F (36.7 C)  TempSrc: Oral Oral Oral   SpO2: 95% 95% 95% 97%  Weight: 48.1 kg   47.5 kg    Wt Readings from Last 3 Encounters:  10/25/20 47.5 kg  10/03/20 43.1 kg  08/12/20 42.6 kg     Intake/Output Summary (Last 24 hours) at 10/25/2020 1017 Last data filed at 10/25/2020 0914 Gross per 24 hour  Intake 720 ml  Output 800 ml  Net -80 ml     Physical Exam  Awake Alert, No new F.N deficits, Normal affect [place].AT,PERRAL Supple Neck,No JVD, No cervical lymphadenopathy appriciated.  Symmetrical Chest wall movement, Good air movement bilaterally, CTAB RRR,No Gallops, Rubs or new Murmurs, No Parasternal Heave +ve B.Sounds, Abd Soft, No tenderness, Colostomy in place  No Cyanosis, Clubbing or edema, No new Rash or bruise     Data Review:    CBC Recent Labs  Lab 10/21/20 2310 10/22/20 0617 10/24/20 0142  WBC 7.1 8.1 6.7   HGB 13.4 13.2 11.8*  HCT 41.9 40.4 36.9  PLT 228 242 191  MCV 96.5 94.6 95.6  MCH 30.9 30.9 30.6  MCHC 32.0 32.7 32.0  RDW 13.8 14.0 14.0  LYMPHSABS 2.2  --  1.7  MONOABS 0.5  --  0.7  EOSABS 0.1  --  0.5  BASOSABS 0.0  --  0.1    Recent Labs  Lab 10/21/20 2126 10/22/20 0617 10/22/20 0620 10/24/20 0142  NA 135  --   --  137  K 4.7  --   --  4.0  CL 98  --   --  103  CO2 26  --   --  28  GLUCOSE 98  --   --  86  BUN 19  --   --  14  CREATININE 1.12* 0.93  --  1.01*  CALCIUM 9.5  --   --  9.1  AST 29  --   --  15  ALT 13  --   --  12  ALKPHOS 81  --   --  64  BILITOT 0.6  --   --  0.5  ALBUMIN 3.2*  --   --  2.6*  MG  --   --   --  1.8  TSH  --   --  7.682*  --   BNP  --   --   --  154.6*    ------------------------------------------------------------------------------------------------------------------ No results for input(s): CHOL, HDL, LDLCALC, TRIG, CHOLHDL, LDLDIRECT in the last 72 hours.  Lab Results  Component Value Date   HGBA1C 5.4 02/23/2017   ------------------------------------------------------------------------------------------------------------------ No results for input(s): TSH, T4TOTAL, T3FREE, THYROIDAB in the last 72 hours.  Invalid input(s): FREET3   Cardiac Enzymes No results for input(s): CKMB, TROPONINI, MYOGLOBIN in the last 168 hours.  Invalid input(s): CK ------------------------------------------------------------------------------------------------------------------    Component Value Date/Time   BNP 154.6 (H) 10/24/2020 0142      Radiology Reports CT ABDOMEN PELVIS W CONTRAST  Result Date: 10/03/2020 CLINICAL DATA:  Abdominal pain EXAM: CT ABDOMEN AND PELVIS WITH CONTRAST TECHNIQUE: Multidetector CT imaging of the abdomen and pelvis was performed using the standard protocol following bolus administration of intravenous contrast. CONTRAST:  39m OMNIPAQUE IOHEXOL 300 MG/ML  SOLN COMPARISON:  05/02/2020 FINDINGS: Lower  chest: Mild cardiomegaly. Dense valvular calcifications, most pronounced in the mitral valve. Coronary artery and aortic calcifications. Moderate right pleural effusion and small left pleural effusion. Probable atelectasis in the lower lobes. Hepatobiliary: 4.1 cm cyst in the right hepatic dome appears  benign and stable. No suspicious focal hepatic abnormality. Gallbladder unremarkable. Pancreas: No focal abnormality or ductal dilatation. Spleen: Calcifications in the spleen compatible with old granulomas. Normal size. Adrenals/Urinary Tract: No adrenal abnormality. No focal renal abnormality. No stones or hydronephrosis. Urinary bladder is unremarkable. Stomach/Bowel: Sigmoid diverticulosis. No active diverticulitis. Bowel decompressed. No bowel obstruction. Left lower quadrant ostomy again noted, unchanged. Vascular/Lymphatic: Aortic atherosclerosis. No evidence of aneurysm or adenopathy. Reproductive: Prior hysterectomy.  No adnexal masses. Other: No free fluid or free air. Musculoskeletal: Prior right hip replacement. Degenerative changes in the lumbar spine. No acute bony abnormality. IMPRESSION: Moderate right pleural effusion and small left pleural effusion. This is stable or improved on the right since prior study. Cardiomegaly, coronary artery disease, aortic atherosclerosis. Left lower quadrant ostomy, unchanged. No evidence of bowel obstruction. No acute findings. Electronically Signed   By: Rolm Baptise M.D.   On: 10/03/2020 15:52   DG Chest Port 1 View  Result Date: 10/21/2020 CLINICAL DATA:  Weakness. EXAM: PORTABLE CHEST 1 VIEW COMPARISON:  August 12, 2020. FINDINGS: Stable cardiomediastinal silhouette. Stable right midlung subsegmental atelectasis or scarring is noted. Mild bibasilar subsegmental atelectasis is noted. The visualized skeletal structures are unremarkable. IMPRESSION: Mild bibasilar subsegmental atelectasis. Stable right midlung subsegmental atelectasis or scarring. Aortic  Atherosclerosis (ICD10-I70.0). Electronically Signed   By: Marijo Conception M.D.   On: 10/21/2020 21:49

## 2020-10-25 NOTE — Progress Notes (Signed)
   10/25/20 1035  Mobility  Activity Refused mobility (Attempt x2)

## 2020-10-25 NOTE — Care Management Important Message (Signed)
Important Message  Patient Details  Name: [name] MRN: [id number] Date of Birth: 11-Aug-1916   Medicare Important Message Given:  Yes     Memory Argue 10/25/2020, 2:40 PM

## 2020-10-26 MED ORDER — PANTOPRAZOLE SODIUM 40 MG PO TBEC
40.0000 mg | DELAYED_RELEASE_TABLET | Freq: Every day | ORAL | Status: DC
  Administered 2020-10-26 – 2020-10-27 (×2): 40 mg via ORAL
  Filled 2020-10-26 (×2): qty 1

## 2020-10-26 NOTE — Progress Notes (Signed)
Physical Therapy Treatment Patient Details Name: [name] MRN: [id number] DOB: 25-Sep-1916 Today's Date: 10/26/2020   History of Present Illness Pt is 87 yo female admitted on 10/21/20 with weakness, shaking spells with hypothermia, and UTI.  She has hx including but not limited to CAD with stentis, CHF, hypothryoidism, a flutter, rectal CA with hemicolectomy and colostomy.    PT Comments    Pt laying over on her side at the [place] after eating breakfast, but quickly sits up when PT enters the room. Pt reports she is happy she was able to step to the [facility] earlier, and is agreeable to try to walk further. Pt is min A for coming to standing and light min A for ambulating 40 feet in hallway. With return to room and sitting in recliner pt states "I thought I was never going to walk again." Pt is progressing however is not at level to independently walk to her dining room and PT continues to recommend SNF for short bout of rehab prior to return to ALF. PT will continue to follow acutely.    Recommendations for follow up therapy are one component of a multi-disciplinary discharge planning process, led by the attending physician.  Recommendations may be updated based on patient status, additional functional criteria and insurance authorization.  Follow Up Recommendations  SNF (at [facility])     Equipment Recommendations  None recommended by PT       Precautions / Restrictions Precautions Precautions: Fall Precaution Comments: urinary incontinence, watch 02 Restrictions Weight Bearing Restrictions: No     Mobility  Bed Mobility Overal bed mobility: Needs Assistance Bed Mobility: Supine to Sit;Sit to Supine     Supine to sit: Supervision     General bed mobility comments: pt laying sideways at EoB with LE OOB after eating breakfast, quickly and easily come to seated when PT comes in room    Transfers Overall transfer level: Needs assistance Equipment used: Rolling walker (2  wheeled) Transfers: Sit to/from Stand Sit to Stand: Min assist         General transfer comment: good power up to RW, light min A for steadying  Ambulation/Gait Ambulation/Gait assistance: Min assist Gait Distance (Feet): 40 Feet Assistive device: Rolling walker (2 wheeled) Gait Pattern/deviations: Step-through pattern;Decreased step length - right;Decreased step length - left;Trunk flexed Gait velocity: decreased Gait velocity interpretation: <1.31 ft/sec, indicative of household ambulator General Gait Details: light min A and management of lines, slow, steady pace with no overt LoB,          Balance Overall balance assessment: Needs assistance   Sitting balance-Leahy Scale: Fair     Standing balance support: Bilateral upper extremity supported Standing balance-Leahy Scale: Poor Standing balance comment: posterior bias initially however when CoG brought over BoSpt able to [employer] balance with RW                            Cognition Arousal/Alertness: Awake/alert Behavior During Therapy: WFL for tasks assessed/performed Overall Cognitive Status: Within Functional Limits for tasks assessed                                 General Comments: reports being tired stating "I can't get to sleep"         General Comments General comments (skin integrity, edema, etc.): VSS      Pertinent Vitals/Pain Pain Assessment: No/denies pain  PT Goals (current goals can now be found in the care plan section) Acute Rehab PT Goals Patient Stated Goal: walk without fearing falling PT Goal Formulation: With patient Time For Goal Achievement: 11/05/20 Potential to Achieve Goals: Good Progress towards PT goals: Progressing toward goals    Frequency    Min 2X/week      PT Plan Current plan remains appropriate       AM-PAC PT "6 Clicks" Mobility   Outcome Measure  Help needed turning from your back to your side while in a flat bed without  using bedrails?: None Help needed moving from lying on your back to sitting on the side of a flat bed without using bedrails?: None Help needed moving to and from a bed to a chair (including a wheelchair)?: A Little Help needed standing up from a chair using your arms (e.g., wheelchair or bedside chair)?: A Little Help needed to walk in hospital room?: A Little Help needed climbing 3-5 steps with a railing? : Total 6 Click Score: 18    End of Session Equipment Utilized During Treatment: Gait belt Activity Tolerance: Patient tolerated treatment well Patient left: in chair;with call bell/phone within reach;with chair alarm set Nurse Communication: Mobility status PT Visit Diagnosis: Unsteadiness on feet (R26.81);Muscle weakness (generalized) (M62.81)     Time: [id number] PT Time Calculation (min) (ACUTE ONLY): 22 min  Charges:  $Therapeutic Exercise: 8-22 mins                     Jakhari Space B. Migdalia Dk PT, DPT Acute Rehabilitation Services Pager [phone] Office [phone]    [place] 10/26/2020, 11:53 AM

## 2020-10-26 NOTE — Progress Notes (Signed)
PROGRESS NOTE                                                                                                                                                                                                             Patient Demographics:    [name], is a 87 y.o. female, DOB - [birth year], [id number]  Outpatient Primary MD for the patient is Leonard Downing, MD    LOS - 4  Admit date - 10/21/2020    Chief Complaint  Patient presents with   Weakness       Brief Narrative (HPI from H&P)  - [name] is a 87 y.o. female with history of CAD status post stenting, diastolic CHF, hypothyroidism, atrial flutter history of rectal cancer status post hemicolectomy and colostomy was brought to the ER after patient was feeling weak tired and poor appetite.  Patient states she has been having dysuria with on antibiotics and also developed some diarrhea, she was diagnosed with UTI and admitted.   Subjective:   Patient in bed, appears comfortable, denies any headache, no fever, no chest pain or pressure, no shortness of breath , no abdominal pain. No new focal weakness, her problems swallowing food is better with soft diet.   Assessment  & Plan :     UTI causing weakness -initially there was concern sepsis but he says that clinically improved, post Rocephin x 3.  Continue supportive care.  Baseline she is wheelchair-bound.  2.  CAD s/p PCI - on ASA for secondary prevention.  3. Chr. Diastolic CHF - stable, mild chronic Pl. Effusions, home dose Lasix resumed.  4.  History of paroxysmal atrial fibrillation.  Continue amiodarone.  CHA2DS2-VASc score of greater than 3 but not long-term anticoagulation.   5.  History of rectal cancer s/p hemicolectomy and colostomy.  Supportive care.  6.  Hypothyroidism.  Continue home dose Synthroid.  Stable TSH.  7.  Dysphagia noted morning of 10/25/2020.  With solids.  For now  soft food, speech eval pending.      Condition - Fair  Family Communication  :  None present  Code Status :  Full  Consults  :  None  PUD Prophylaxis : None   Procedures  :     CT - Moderate right pleural effusion and small left pleural effusion. This is  stable or improved on the right since prior study. Cardiomegaly, coronary artery disease, aortic atherosclerosis. Left lower quadrant ostomy, unchanged. No evidence of bowel obstruction. No acute findings.      Disposition Plan  :    Status is: Inpatient  Remains inpatient appropriate because:IV treatments appropriate due to intensity of illness or inability to take PO  Dispo: The patient is from: SNF              Anticipated d/c is to: SNF              Patient currently is not medically stable to d/c.   Difficult to place patient No  DVT Prophylaxis  :    heparin injection 5,000 Units Start: 10/22/20 1400    Lab Results  Component Value Date   PLT 191 10/24/2020    Diet :  Diet Order             DIET SOFT Room service appropriate? Yes; Fluid consistency: Thin  Diet effective now                    Inpatient Medications  Scheduled Meds:  amiodarone  200 mg Oral Daily   artificial tears   Both Eyes QHS   aspirin EC  81 mg Oral q morning   clonazePAM  0.25 mg Oral QHS   heparin  5,000 Units Subcutaneous Q8H   levothyroxine  25 mcg Oral Daily   melatonin  5 mg Oral QHS   sertraline  25 mg Oral QHS   Continuous Infusions:  sodium chloride Stopped (10/23/20 2014)   PRN Meds:.sodium chloride, acetaminophen **OR** [DISCONTINUED] acetaminophen, nitroGLYCERIN, ondansetron (ZOFRAN) IV  Antibiotics  :    Anti-infectives (From admission, onward)    Start     Dose/Rate Route Frequency Ordered Stop   10/24/20 0800  cefTRIAXone (ROCEPHIN) 2 g in sodium chloride 0.9 % 100 mL IVPB        2 g 200 mL/hr over 30 Minutes Intravenous Every 24 hours 10/23/20 1545 10/26/20 0930   10/23/20 1645  cefTRIAXone  (ROCEPHIN) 1 g in sodium chloride 0.9 % 100 mL IVPB        1 g 200 mL/hr over 30 Minutes Intravenous  Once 10/23/20 1545 10/23/20 1900   10/22/20 0800  cefTRIAXone (ROCEPHIN) 1 g in sodium chloride 0.9 % 100 mL IVPB  Status:  Discontinued        1 g 200 mL/hr over 30 Minutes Intravenous Every 24 hours 10/22/20 0505 10/23/20 1545   10/22/20 0145  ciprofloxacin (CIPRO) IVPB 400 mg        400 mg 200 mL/hr over 60 Minutes Intravenous  Once 10/22/20 0141 10/22/20 0307        Time Spent in minutes  30   Lala Lund M.D on 10/26/2020 at 11:18 AM  To page go to [email or web address]      See all Orders from today for further details    Objective:   Vitals:   10/25/20 0400 10/25/20 1030 10/25/20 2000 10/26/20 0538  BP:  (!) 115/46 (!) 123/45 (!) 124/56  Pulse:  (!) 58  (!) 56  Resp:  16  20  Temp: 98.1 F (36.7 C) 98 F (36.7 C)  97.7 F (36.5 C)  TempSrc:  Oral  Oral  SpO2: 97% 96%  98%  Weight: 47.5 kg   46.1 kg    Wt Readings from Last 3 Encounters:  10/26/20 46.1 kg  10/03/20 43.1 kg  08/12/20  42.6 kg     Intake/Output Summary (Last 24 hours) at 10/26/2020 1118 Last data filed at 10/26/2020 0931 Gross per 24 hour  Intake 834.04 ml  Output 550 ml  Net 284.04 ml     Physical Exam  Awake Alert, No new F.N deficits, Normal affect [place].AT,PERRAL Supple Neck,No JVD, No cervical lymphadenopathy appriciated.  Symmetrical Chest wall movement, Good air movement bilaterally, CTAB RRR,No Gallops, Rubs or new Murmurs, No Parasternal Heave +ve B.Sounds, Abd Soft, No tenderness,  Colostomy in place  No Cyanosis, Clubbing or edema, No new Rash or bruise     Data Review:    CBC Recent Labs  Lab 10/21/20 2310 10/22/20 0617 10/24/20 0142  WBC 7.1 8.1 6.7  HGB 13.4 13.2 11.8*  HCT 41.9 40.4 36.9  PLT 228 242 191  MCV 96.5 94.6 95.6  MCH 30.9 30.9 30.6  MCHC 32.0 32.7 32.0  RDW 13.8 14.0 14.0  LYMPHSABS 2.2  --  1.7  MONOABS 0.5  --  0.7  EOSABS 0.1  --  0.5   BASOSABS 0.0  --  0.1    Recent Labs  Lab 10/21/20 2126 10/22/20 0617 10/22/20 0620 10/24/20 0142  NA 135  --   --  137  K 4.7  --   --  4.0  CL 98  --   --  103  CO2 26  --   --  28  GLUCOSE 98  --   --  86  BUN 19  --   --  14  CREATININE 1.12* 0.93  --  1.01*  CALCIUM 9.5  --   --  9.1  AST 29  --   --  15  ALT 13  --   --  12  ALKPHOS 81  --   --  64  BILITOT 0.6  --   --  0.5  ALBUMIN 3.2*  --   --  2.6*  MG  --   --   --  1.8  TSH  --   --  7.682*  --   BNP  --   --   --  154.6*    ------------------------------------------------------------------------------------------------------------------ No results for input(s): CHOL, HDL, LDLCALC, TRIG, CHOLHDL, LDLDIRECT in the last 72 hours.  Lab Results  Component Value Date   HGBA1C 5.4 02/23/2017   ------------------------------------------------------------------------------------------------------------------ No results for input(s): TSH, T4TOTAL, T3FREE, THYROIDAB in the last 72 hours.  Invalid input(s): FREET3   Cardiac Enzymes No results for input(s): CKMB, TROPONINI, MYOGLOBIN in the last 168 hours.  Invalid input(s): CK ------------------------------------------------------------------------------------------------------------------    Component Value Date/Time   BNP 154.6 (H) 10/24/2020 0142      Radiology Reports CT ABDOMEN PELVIS W CONTRAST  Result Date: 10/03/2020 CLINICAL DATA:  Abdominal pain EXAM: CT ABDOMEN AND PELVIS WITH CONTRAST TECHNIQUE: Multidetector CT imaging of the abdomen and pelvis was performed using the standard protocol following bolus administration of intravenous contrast. CONTRAST:  89m OMNIPAQUE IOHEXOL 300 MG/ML  SOLN COMPARISON:  05/02/2020 FINDINGS: Lower chest: Mild cardiomegaly. Dense valvular calcifications, most pronounced in the mitral valve. Coronary artery and aortic calcifications. Moderate right pleural effusion and small left pleural effusion. Probable  atelectasis in the lower lobes. Hepatobiliary: 4.1 cm cyst in the right hepatic dome appears benign and stable. No suspicious focal hepatic abnormality. Gallbladder unremarkable. Pancreas: No focal abnormality or ductal dilatation. Spleen: Calcifications in the spleen compatible with old granulomas. Normal size. Adrenals/Urinary Tract: No adrenal abnormality. No focal renal abnormality. No stones or hydronephrosis. Urinary  bladder is unremarkable. Stomach/Bowel: Sigmoid diverticulosis. No active diverticulitis. Bowel decompressed. No bowel obstruction. Left lower quadrant ostomy again noted, unchanged. Vascular/Lymphatic: Aortic atherosclerosis. No evidence of aneurysm or adenopathy. Reproductive: Prior hysterectomy.  No adnexal masses. Other: No free fluid or free air. Musculoskeletal: Prior right hip replacement. Degenerative changes in the lumbar spine. No acute bony abnormality. IMPRESSION: Moderate right pleural effusion and small left pleural effusion. This is stable or improved on the right since prior study. Cardiomegaly, coronary artery disease, aortic atherosclerosis. Left lower quadrant ostomy, unchanged. No evidence of bowel obstruction. No acute findings. Electronically Signed   By: Rolm Baptise M.D.   On: 10/03/2020 15:52   DG Chest Port 1 View  Result Date: 10/21/2020 CLINICAL DATA:  Weakness. EXAM: PORTABLE CHEST 1 VIEW COMPARISON:  August 12, 2020. FINDINGS: Stable cardiomediastinal silhouette. Stable right midlung subsegmental atelectasis or scarring is noted. Mild bibasilar subsegmental atelectasis is noted. The visualized skeletal structures are unremarkable. IMPRESSION: Mild bibasilar subsegmental atelectasis. Stable right midlung subsegmental atelectasis or scarring. Aortic Atherosclerosis (ICD10-I70.0). Electronically Signed   By: Marijo Conception M.D.   On: 10/21/2020 21:49

## 2020-10-26 NOTE — Evaluation (Signed)
Clinical/Bedside Swallow Evaluation Patient Details  Name: [name] MRN: [id number] Date of Birth: 1916/09/20  Today's Date: 10/26/2020 Time: SLP Start Time (ACUTE ONLY): 64 SLP Stop Time (ACUTE ONLY): 1115 SLP Time Calculation (min) (ACUTE ONLY): 35 min  Past Medical History:  Past Medical History:  Diagnosis Date   Arthritis    FINGERS    CAD (coronary artery disease) CARDIOLOGIST-- DR Angelena Form   a. s/p INF STEMI 7/12: tx with BMS to RCA;  b. cath 08/26/10: pLAD 30%, mLAD 50%, D1 40%, pCFX 95%, mRCA occluded;   c. staged PCI of pCFX with BMS;   d. echo 7/12:   EF 60-65%, mild RAE, mild to moderate AI, mild MR, moderate TR, RVE, PASP 47   Complication of anesthesia PT STATES "MADE HER FEEL CRAZY"   Degeneration of eye    left eye cornea   Dyspnea    First degree heart block    Heart palpitations PAC'S AND SVT RUN'S  PER CARDIOLOGIST NOTE   History of ST elevation myocardial infarction (STEMI) 08-26-2010-- INFERIOR WALL   S/P PCI  BMS IN RCA AND PROX. CX   Hyperlipidemia    Hypertension    Impaired hearing BILATERAL HEARING AIDS   Osteopenia    PAF (paroxysmal atrial fibrillation) (HCC)    PONV (postoperative nausea and vomiting)    Pulmonary nodules BENIGN  PER CT  10-12-2010   Rectal Cancer 08/2010   adenocarcinoma   S/P PARTIAL PROCTECTOMY (NO CHEMO OR RADIATION)   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 01/11/2011   S/P colostomy ([place]) SECONDARY TO RECTOVAGINAL FISTULA   S/P coronary artery stent placement 08/2010   X2  BM   Past Surgical History:  Past Surgical History:  Procedure Laterality Date   ABDOMINAL HYSTERECTOMY  1950's   AND APPENDECTOMY   CATARACT EXTRACTION W/ INTRAOCULAR LENS  IMPLANT, BILATERAL     CORONARY ANGIOPLASTY WITH STENT PLACEMENT  08-26-2010  DR [place]   PCI, BM STENT IN RCA   CORONARY ANGIOPLASTY WITH STENT PLACEMENT  08-29-2010  DR [place]   PCI, BM STENT IN PROXIMAL CIRCUMFLEX   EXCISION BENIGN CYST RIGHT BREAST      FISTULA PLUG N/A 06/21/2012   Procedure: insertion of FISTULA PLUG;  Surgeon: Leighton Ruff, MD;  Location: [facility];  Service: General;  Laterality: N/A;   FLEXIBLE SIGMOIDOSCOPY N/A 04/02/2012   Procedure: FLEXIBLE SIGMOIDOSCOPY;  Surgeon: Leighton Ruff, MD;  Location: [facility];  Service: Endoscopy;  Laterality: N/A;   KNEE SURGERY Right 1996   LAPROSCOPY LYSIS ADHESIONS/ DRAINAGE OF PELVIC ABSCESS/ DIVERTING LOOP SIGMOID COLECTOMY  11-22-2010   POST OP RECTOVAGINAL FISTULA   PARTIAL PROCTECTOMY BY TEM  11-17-2010   RECTAL CANCER   RELEASE LEFT CARPAL TUNNEL/ OSTEOTOMY LEFT DISTAL RADIUS  11-10-2009   STAPEDECTOMY  1970'S   TONSILLECTOMY  CHILD   TOTAL HIP ARTHROPLASTY Right 1992   TRANSTHORACIC ECHOCARDIOGRAM  10-10-2011   NORMAL LV SIZE WITH MILD FOCAL BASAL SEPTAL HYPERTROPHY/ EF 55-60%/ NORMAL RV SIZE AND LVSF/ BIATRIAL ENLARGEMENT/ MILD TO MODERATE AI  &  TR   TYMPANOPLASTY Left 12-23-2009   HPI:  87yo female admitted 10/22/20 with weakness, fatigue, and poor PO intake. PMH: CAD, dCHF, hypothyroid, atrial flutter, rectal cancer, arthritis   Assessment / Plan / Recommendation Clinical Impression  Pt seen at bedside for assessment of swallow function and safety. Pt was awake and alert, seated upright in recliner. Pt reports phlegm, frequent throat clearing, and gravelly voice quality  recently. This raises concern for LPR. She reports she is not on a PPI. CN exam is unremarkable. Pt has adequate dentition, but is missing some teeth. Pt accepted trials of thin liquid, nectar thick liquid, puree, and solid textures. Throat clearing and cough noted after trials of thin liquid. She appeared to tolerate nectar thick liquids, puree, and solid trials. Recommend soft chopped diet with nectar thick liquids. Pt was encouraged to take small bites and sips, position upright, remain upright 20-30 minutes after meals. Recommend ST follow up at next level of care to maximize safety and  determine when advanced liquids are appropriate.  SLP Visit Diagnosis: Dysphagia, unspecified (R13.10)    Aspiration Risk  Mild aspiration risk;Risk for inadequate nutrition/hydration    Diet Recommendation Dysphagia 3 (Mech soft);Dysphagia 2 (Fine chop);Nectar-thick liquid   Liquid Administration via: Cup;Straw Medication Administration: Whole meds with liquid Supervision: Patient able to self feed Compensations: Minimize environmental distractions;Slow rate;Small sips/bites Postural Changes: Seated upright at 90 degrees;Remain upright for at least 30 minutes after po intake    Other  Recommendations Oral Care Recommendations: Oral care BID Other Recommendations: Order thickener from pharmacy    Follow up Recommendations Skilled Nursing facility          Prognosis Prognosis for Safe Diet Advancement: Fair      Swallow Study   General Date of Onset: 10/22/20 HPI: 87yo female admitted 10/22/20 with weakness, fatigue, and poor PO intake. PMH: CAD, dCHF, hypothyroid, atrial flutter, rectal cancer, arthritis Type of Study: Bedside Swallow Evaluation Previous Swallow Assessment: none Diet Prior to this Study: Dysphagia 3 (soft);Thin liquids Temperature Spikes Noted: No Respiratory Status: Nasal cannula History of Recent Intubation: No Behavior/Cognition: Alert;Cooperative;Pleasant mood Oral Cavity Assessment: Within Functional Limits Oral Care Completed by SLP: No Oral Cavity - Dentition: Missing dentition;Adequate natural dentition Vision: Functional for self-feeding Self-Feeding Abilities: Able to feed self;Needs set up Patient Positioning: Upright in chair Baseline Vocal Quality: Other (comment) (gravelly) Volitional Cough: Strong Volitional Swallow: Able to elicit    Oral/Motor/Sensory Function Overall Oral Motor/Sensory Function: Within functional limits   Ice Chips Ice chips: Not tested   Thin Liquid Thin Liquid: Impaired Presentation: Cup;Straw Pharyngeal  Phase  Impairments: Cough - Delayed;Throat Clearing - Immediate    Nectar Thick Nectar Thick Liquid: Within functional limits Presentation: Cup;Straw   Honey Thick Honey Thick Liquid: Not tested   Puree Puree: Within functional limits Presentation: Spoon   Solid     Solid: Within functional limits     Trust Leh B. Quentin [place], [facility], [place] Speech Language Pathologist Office: [phone]  Shonna Chock 10/26/2020,11:23 AM

## 2020-10-27 LAB — CULTURE, BLOOD (ROUTINE X 2): Culture: NO GROWTH

## 2020-10-27 LAB — RESP PANEL BY RT-PCR (FLU A&B, COVID) ARPGX2
Influenza A by PCR: NEGATIVE
Influenza B by PCR: NEGATIVE
SARS Coronavirus 2 by RT PCR: NEGATIVE

## 2020-10-27 MED ORDER — CLONAZEPAM 0.5 MG PO TABS: 0.2500 mg | ORAL_TABLET | Freq: Every day | ORAL | 0 refills | Status: DC

## 2020-10-27 MED ORDER — CLONAZEPAM 0.5 MG PO TABS: 0.2500 mg | ORAL_TABLET | Freq: Every day | ORAL | 0 refills | Status: AC

## 2020-10-27 NOTE — Discharge Summary (Addendum)
[name] [id number] DOB: Aug 10, 1916 DOA: 10/21/2020  PCP: Leonard Downing, MD  Admit date: 10/21/2020  Discharge date: 10/27/2020  Admitted From: SNF Disposition:  SNF   Recommendations for Outpatient Follow-up:   Follow up with PCP in 1-2 weeks  PCP Please obtain BMP/CBC, 2 view CXR in 1week,  (see Discharge instructions)   PCP Please follow up on the following pending results: Needs close follow-up and monitoring by speech therapy.   Home Health: None Equipment/Devices: None  Consultations: None  Discharge Condition: Stable    CODE STATUS: Full    Diet Recommendation: Soft diet with feeding assistance and aspiration precautions, make sure patient is sitting in upright position when eating.  Needs to follow-up with speech therapy closely.    Chief Complaint  Patient presents with   Weakness     Brief history of present illness from the day of admission and additional interim summary    [name] is a 87 y.o. female with history of CAD status post stenting, diastolic CHF, hypothyroidism, atrial flutter history of rectal cancer status post hemicolectomy and colostomy was brought to the ER after patient was feeling weak tired and poor appetite.  Patient states she has been having dysuria with on antibiotics and also developed some diarrhea, she was diagnosed with UTI and admitted.                                                                 Hospital Course   UTI causing weakness -initially there was concern sepsis however clinically this was ruled out, this problem has completely resolved, post Rocephin x 3.  Baseline she is wheelchair-bound.   2.  CAD s/p PCI - on ASA for secondary prevention.   3. Chr. Diastolic CHF - stable, mild chronic Pl. Effusions, home dose Lasix resumed.   4.   History of paroxysmal atrial fibrillation.  Continue amiodarone.  CHA2DS2-VASc score of greater than 3 but not long-term anticoagulation.    5.  History of rectal cancer s/p hemicolectomy and colostomy.  Supportive care.   6.  Hypothyroidism.  Continue home dose Synthroid.  Stable TSH.   7.  Dysphagia noted morning of 10/25/2020.  With solids.  Seen by speech and cleared for soft diet and upright position with feeding assistance and aspiration precautions, must follow-up with speech therapist at SNF on a close basis.   Discharge diagnosis     Principal Problem:   Weakness generalized Active Problems:   CAD (coronary artery disease)   HTN (hypertension)   PAF (paroxysmal atrial fibrillation) ([place])   Acute lower UTI   Sepsis [facility])    Discharge instructions    Discharge Instructions     Discharge instructions   Complete by: As directed    Follow with Primary MD Claris Gower  Danne Baxter, MD in 7 days   Get CBC, CMP, 2 view Chest X ray -  checked next visit within 1 week by Primary MD or SNF MD   Activity: As tolerated with Full fall precautions use walker/cane & assistance as needed  Disposition SNF  Diet: Soft diet with feeding assistance and aspiration precautions.   Special Instructions: If you have smoked or chewed Tobacco  in the last 2 yrs please stop smoking, stop any regular Alcohol  and or any Recreational drug use.  On your next visit with your primary care physician please Get Medicines reviewed and adjusted.  Please request your Prim.MD to go over all Hospital Tests and Procedure/Radiological results at the follow up, please get all Hospital records sent to your Prim MD by signing hospital release before you go home.  If you experience worsening of your admission symptoms, develop shortness of breath, life threatening emergency, suicidal or homicidal thoughts you must seek medical attention immediately by calling 911 or calling your MD immediately  if symptoms less  severe.  You Must read complete instructions/literature along with all the possible adverse reactions/side effects for all the Medicines you take and that have been prescribed to you. Take any new Medicines after you have completely understood and accpet all the possible adverse reactions/side effects.   Increase activity slowly   Complete by: As directed        Discharge Medications   Allergies as of 10/27/2020       Reactions   Effexor [venlafaxine Hydrochloride] Nausea And Vomiting   Penicillins Shortness Of Breath   Has patient had a PCN reaction causing immediate rash, facial/tongue/throat swelling, SOB or lightheadedness with hypotension: Yes Has patient had a PCN reaction causing severe rash involving mucus membranes or skin necrosis: No Has patient had a PCN reaction that required hospitalization: No Has patient had a PCN reaction occurring within the last 10 years: No If all of the above answers are "NO", then may proceed with Cephalosporin use.   Sulfa Antibiotics Nausea Only   Dicyclomine Other (See Comments)   Caused confusion   Lactose Intolerance (gi) Diarrhea   Hydrocodone Nausea Only        Medication List     STOP taking these medications    HYDROcodone-acetaminophen 5-325 MG tablet Commonly known as: NORCO/VICODIN       TAKE these medications    acetaminophen 500 MG tablet Commonly known as: TYLENOL Take 2 tablets (1,000 mg total) by mouth every 6 (six) hours as needed for mild pain. What changed: when to take this   amiodarone 200 MG tablet Commonly known as: PACERONE 1 tab (200 mg total) by mouth twice daily for 1 week followed by 1 tab (200 mg total) by mouth daily thereafter. What changed:  how much to take how to take this when to take this additional instructions   aspirin EC 81 MG tablet Take 81 mg by mouth every morning.   beta carotene w/minerals tablet Take 1 tablet by mouth every morning.   cholecalciferol 25 MCG (1000 UNIT)  tablet Commonly known as: VITAMIN D3 Take 1,000 Units by mouth every morning.   clonazePAM 0.5 MG tablet Commonly known as: KLONOPIN Take 0.5 tablets (0.25 mg total) by mouth at bedtime.   docusate sodium 100 MG capsule Commonly known as: COLACE Take 100 mg by mouth at bedtime.   furosemide 20 MG tablet Commonly known as: LASIX Take 20 mg by mouth daily.   levothyroxine 25 MCG tablet Commonly  known as: SYNTHROID Take 25 mcg by mouth daily.   melatonin 5 MG Tabs Take 5 mg by mouth at bedtime.   nitrofurantoin (macrocrystal-monohydrate) 100 MG capsule Commonly known as: MACROBID Take 100 mg by mouth 2 (two) times daily.   nitroGLYCERIN 0.4 MG SL tablet Commonly known as: NITROSTAT Place 0.4 mg under the tongue every 5 (five) minutes as needed for chest pain.   ondansetron 8 MG disintegrating tablet Commonly known as: Zofran ODT Take 1 tablet (8 mg total) by mouth every 8 (eight) hours as needed for nausea.   Polyethyl Glycol-Propyl Glycol 0.4-0.3 % Gel ophthalmic gel Commonly known as: SYSTANE Place 1 drop into both eyes at bedtime.   sertraline 25 MG tablet Commonly known as: ZOLOFT Take 25 mg by mouth at bedtime.         Follow-up Information     Leonard Downing, MD. Schedule an appointment as soon as possible for a visit in 1 week(s).   Specialty: Family Medicine Contact information: [place] [address] [phone]         Burnell Blanks, MD .   Specialty: Cardiology Contact information: [address] [place] [address] [phone]                 Major procedures and Radiology Reports - PLEASE review detailed and final reports thoroughly  -       CT ABDOMEN PELVIS W CONTRAST  Result Date: 10/03/2020 CLINICAL DATA:  Abdominal pain EXAM: CT ABDOMEN AND PELVIS WITH CONTRAST TECHNIQUE: Multidetector CT imaging of the abdomen and pelvis was performed using the standard protocol  following bolus administration of intravenous contrast. CONTRAST:  73m OMNIPAQUE IOHEXOL 300 MG/ML  SOLN COMPARISON:  05/02/2020 FINDINGS: Lower chest: Mild cardiomegaly. Dense valvular calcifications, most pronounced in the mitral valve. Coronary artery and aortic calcifications. Moderate right pleural effusion and small left pleural effusion. Probable atelectasis in the lower lobes. Hepatobiliary: 4.1 cm cyst in the right hepatic dome appears benign and stable. No suspicious focal hepatic abnormality. Gallbladder unremarkable. Pancreas: No focal abnormality or ductal dilatation. Spleen: Calcifications in the spleen compatible with old granulomas. Normal size. Adrenals/Urinary Tract: No adrenal abnormality. No focal renal abnormality. No stones or hydronephrosis. Urinary bladder is unremarkable. Stomach/Bowel: Sigmoid diverticulosis. No active diverticulitis. Bowel decompressed. No bowel obstruction. Left lower quadrant ostomy again noted, unchanged. Vascular/Lymphatic: Aortic atherosclerosis. No evidence of aneurysm or adenopathy. Reproductive: Prior hysterectomy.  No adnexal masses. Other: No free fluid or free air. Musculoskeletal: Prior right hip replacement. Degenerative changes in the lumbar spine. No acute bony abnormality. IMPRESSION: Moderate right pleural effusion and small left pleural effusion. This is stable or improved on the right since prior study. Cardiomegaly, coronary artery disease, aortic atherosclerosis. Left lower quadrant ostomy, unchanged. No evidence of bowel obstruction. No acute findings. Electronically Signed   By: KRolm BaptiseM.D.   On: 10/03/2020 15:52   DG Chest Port 1 View  Result Date: 10/21/2020 CLINICAL DATA:  Weakness. EXAM: PORTABLE CHEST 1 VIEW COMPARISON:  August 12, 2020. FINDINGS: Stable cardiomediastinal silhouette. Stable right midlung subsegmental atelectasis or scarring is noted. Mild bibasilar subsegmental atelectasis is noted. The visualized skeletal structures are  unremarkable. IMPRESSION: Mild bibasilar subsegmental atelectasis. Stable right midlung subsegmental atelectasis or scarring. Aortic Atherosclerosis (ICD10-I70.0). Electronically Signed   By: JMarijo ConceptionM.D.   On: 10/21/2020 21:49     Today   Subjective    Joyce Heatontoday has no headache,no chest  abdominal pain,no new weakness tingling or numbness, feels much better wants to go home today.     Objective   Blood pressure (!) 137/53, pulse (!) 58, temperature 98.3 F (36.8 C), temperature source Oral, resp. rate 14, weight 48.6 kg, SpO2 93 %.   Intake/Output Summary (Last 24 hours) at 10/27/2020 0850 Last data filed at 10/26/2020 2100 Gross per 24 hour  Intake 480 ml  Output 150 ml  Net 330 ml    Exam  Awake Alert, No new F.N deficits, Normal affect [place].AT,PERRAL Supple Neck,No JVD, No cervical lymphadenopathy appriciated.  Symmetrical Chest wall movement, Good air movement bilaterally, CTAB RRR,No Gallops,Rubs or new Murmurs, No Parasternal Heave +ve B.Sounds, Abd Soft, Non tender, colostomy bag in place No Cyanosis, Clubbing or edema, No new Rash or bruise   Data Review   CBC w Diff:  Lab Results  Component Value Date   WBC 6.7 10/24/2020   HGB 11.8 (L) 10/24/2020   HCT 36.9 10/24/2020   PLT 191 10/24/2020   LYMPHOPCT 25 10/24/2020   BANDSPCT 21 (H) 11/18/2010   MONOPCT 10 10/24/2020   EOSPCT 8 10/24/2020   BASOPCT 1 10/24/2020    CMP:  Lab Results  Component Value Date   NA 137 10/24/2020   K 4.0 10/24/2020   CL 103 10/24/2020   CO2 28 10/24/2020   BUN 14 10/24/2020   CREATININE 1.01 (H) 10/24/2020   PROT 6.2 (L) 10/24/2020   ALBUMIN 2.6 (L) 10/24/2020   BILITOT 0.5 10/24/2020   ALKPHOS 64 10/24/2020   AST 15 10/24/2020   ALT 12 10/24/2020  .   Total Time in preparing paper work, data evaluation and todays exam - 46 minutes  Lala Lund M.D on 10/27/2020 at 8:50 AM  [facility]

## 2020-10-27 NOTE — Therapy (Signed)
Occupational Therapy Treatment Patient Details Name: [name] MRN: [id number] DOB: [birth year] Today's Date: 10/27/2020   History of present illness Pt is 87 yo female admitted on 10/21/20 with weakness, shaking spells with hypothermia, and UTI.  She has hx including but not limited to CAD with stentis, CHF, hypothryoidism, a flutter, rectal CA with hemicolectomy and colostomy.   OT comments  Pt seen for ADL retraining session today, with focus on bathing, dressing, grooming, eating and functional transfers x4 (sit to stand)/. Pt with noted c/o fatigue and feeling "sleepy" throughout session. Pt was without any coughing during the duration of this session, including during eating, drinking and oral care. She expressed displeasure with nectar thick liquids, "I really don't like that, can't drink anymore of it". She benefits from frequent rest breaks and increaed time for tasks.   Recommendations for follow up therapy are one component of a multi-disciplinary discharge planning process, led by the attending physician.  Recommendations may be updated based on patient status, additional functional criteria and insurance authorization.    Follow Up Recommendations  SNF;Supervision/Assistance - 24 hour    Equipment Recommendations  3 in 1 bedside commode (Pt needs 3:1 to place over toilet at ALF/SNF as she reports increased difficulty "Pulling myself up off the low toilet there")    Recommendations for Other Services      Precautions / Restrictions Precautions Precautions: Fall Precaution Comments: urinary incontinence, watch 02 Restrictions Weight Bearing Restrictions: No       Mobility Bed Mobility Overal bed mobility: Needs Assistance Bed Mobility: Supine to Sit;Sit to Supine     Supine to sit: Supervision Sit to supine: Mod assist (Mod A to scoot up in bed)        Transfers Overall transfer level: Needs assistance Equipment used: Rolling walker (2 wheeled) Transfers: Sit  to/from Stand Sit to Stand: Supervision;Min guard         General transfer comment: Good power up to RW, Min guard to steady and vc to "stand tall" as pt had tendency to lean forward.    Balance Overall balance assessment: Needs assistance Sitting-balance support: No upper extremity supported;Feet supported;Single extremity supported Sitting balance-Leahy Scale: Good     Standing balance support: Single extremity supported;Bilateral upper extremity supported Standing balance-Leahy Scale: Poor Standing balance comment: reliant on RW, during functional activity     ADL either performed or assessed with clinical judgement   ADL Overall ADL's : Needs assistance/impaired Eating/Feeding: Set up;Sitting Eating/Feeding Details (indicate cue type and reason): Pt ate breakfast sitting up at EOB Grooming: Wash/dry hands;Wash/dry face;Oral care;Applying deodorant;Brushing hair;Supervision/safety;Sitting (Sitting up at EOB, pt declined ambulation to sit at sink secondary to feeling "sleepy".)   Upper Body Bathing: Supervision/ safety;Sitting;Minimal assistance (Min A to wash/bathe back sitting up at EOB)   Lower Body Bathing: Minimal assistance;Moderate assistance;Sitting/lateral leans;Sit to/from stand Lower Body Bathing Details (indicate cue type and reason): Pt able to bathe peri area and upper quads w/ Min A sit to stand, Mod A sitting to bathe lower legs and feet noted. Pt with overall generalized weakness and fatigues easily. Upper Body Dressing : Supervision/safety;Sitting;Min guard;Set up (Min guard secondary to IV/lines only)   Lower Body Dressing: Maximal assistance;Bed level (Max A donning socks at bedlevel prior to sitting up at EOB for additional ADL's)   Toilet Transfer: Minimal assistance;Moderate assistance (Simulated as seen by sit to stand x4 from EOB during ADL's (benefits from 3:1 at bedside))   Toileting- Clothing Manipulation and Hygiene: Moderate  assistance;Sitting/lateral  lean;Sit to/from stand       Functional mobility during ADLs: Minimal assistance;Rolling walker;Cueing for safety General ADL Comments: Pt seen for ADL retraining session today, pt with noted c/o fatigue and feeling "sleepy" throughout session. She benefits from frequent rest breaks and increaed time for tasks.    Cognition Arousal/Alertness: Awake/alert Behavior During Therapy: WFL for tasks assessed/performed Overall Cognitive Status: Within Functional Limits for tasks assessed    General Comments: States "I feel so sleepy all the time. When will I not feel so sleepy?"              General Comments Fatigues easily, benefits from frequent short rest breaks.    Pertinent Vitals/ Pain       Pain Assessment: No/denies pain Faces Pain Scale: No hurt   Frequency  Min 2X/week        Progress Toward Goals  OT Goals(current goals can now be found in the care plan section)  Progress towards OT goals: Progressing toward goals  Acute Rehab OT Goals Patient Stated Goal: "Not be so sleepy" OT Goal Formulation: With patient Time For Goal Achievement: 11/06/20 Potential to Achieve Goals: Good  Plan Discharge plan remains appropriate       AM-PAC OT "6 Clicks" Daily Activity     Outcome Measure   Help from another person eating meals?: A Little Help from another person taking care of personal grooming?: A Lot Help from another person toileting, which includes using toliet, bedpan, or urinal?: Total Help from another person bathing (including washing, rinsing, drying)?: A Lot Help from another person to put on and taking off regular upper body clothing?: A Little Help from another person to put on and taking off regular lower body clothing?: A Lot 6 Click Score: 13    End of Session Equipment Utilized During Treatment: Rolling walker;Oxygen  OT Visit Diagnosis: Unsteadiness on feet (R26.81);Other abnormalities of gait and mobility (R26.89);Muscle  weakness (generalized) (M62.81)   Activity Tolerance Patient tolerated treatment well;Patient limited by fatigue   Patient Left in bed;with call bell/phone within reach;with bed alarm set;Other (comment) (NT in room)   Nurse Communication Other (comment);Mobility status (ADL's completed, pt needs PurWik placed.)        Time: [id number] OT Time Calculation (min): 42 min  Charges: OT General Charges $OT Visit: 1 Visit OT Treatments $Self Care/Home Management : 38-52 mins  [place] Beth Dixon, OTR/L 10/27/2020, 9:19 AM

## 2020-10-27 NOTE — Discharge Instructions (Signed)
Follow with Primary MD Leonard Downing, MD in 7 days   Get CBC, CMP, 2 view Chest X ray -  checked next visit within 1 week by Primary MD or SNF MD   Activity: As tolerated with Full fall precautions use walker/cane & assistance as needed  Disposition SNF  Diet: Soft diet with feeding assistance and aspiration precautions.   Special Instructions: If you have smoked or chewed Tobacco  in the last 2 yrs please stop smoking, stop any regular Alcohol  and or any Recreational drug use.  On your next visit with your primary care physician please Get Medicines reviewed and adjusted.  Please request your Prim.MD to go over all Hospital Tests and Procedure/Radiological results at the follow up, please get all Hospital records sent to your Prim MD by signing hospital release before you go home.  If you experience worsening of your admission symptoms, develop shortness of breath, life threatening emergency, suicidal or homicidal thoughts you must seek medical attention immediately by calling 911 or calling your MD immediately  if symptoms less severe.  You Must read complete instructions/literature along with all the possible adverse reactions/side effects for all the Medicines you take and that have been prescribed to you. Take any new Medicines after you have completely understood and accpet all the possible adverse reactions/side effects.

## 2020-10-27 NOTE — TOC Transition Note (Addendum)
Transition of Care [facility]) - CM/SW Discharge Note   Patient Details  Name: [name] MRN: [id number] Date of Birth: Jul 31, 1916  [facility] CM/SW Contact:  Tresa Endo Phone Number: 10/27/2020, 9:47 AM   Clinical Narrative:    Patient will DC to: [facility] Anticipated DC date: 10/27/2020 Family notified: Pt Niece (POA) Transport by: Pt POA    Per MD patient ready for DC to [facility]. RN to call report prior to discharge [phone]). RN, patient, patient's family, and facility notified of DC. Discharge Summary and FL2 sent to facility. DC packet on chart.   CSW will sign off for now as social work intervention is no longer needed. Please consult [place] again if new needs arise.       Barriers to Discharge: Ship broker, Continued Medical Work up, SNF Pending bed offer   Patient Goals and CMS Choice Patient states their goals for this hospitalization and ongoing recovery are:: To be able to walk on my own again CMS Medicare.gov Compare Post Acute Care list provided to:: Other (Comment Required) (website provided to [facility])    Discharge Placement                       Discharge Plan and Services                                     Social Determinants of Health (SDOH) Interventions     Readmission Risk Interventions Readmission Risk Prevention Plan 05/04/2020  Transportation Screening Complete  PCP or Specialist Appt within 5-7 Days Complete  Home Care Screening Complete  Medication Review (RN CM) Complete  Some recent data might be hidden

## 2020-11-26 ENCOUNTER — Other Ambulatory Visit: Payer: Self-pay

## 2020-11-26 ENCOUNTER — Ambulatory Visit (HOSPITAL_BASED_OUTPATIENT_CLINIC_OR_DEPARTMENT_OTHER): Payer: Medicare PPO | Admitting: Family

## 2020-11-26 ENCOUNTER — Ambulatory Visit (INDEPENDENT_AMBULATORY_CARE_PROVIDER_SITE_OTHER): Payer: Medicare PPO | Admitting: Family

## 2020-11-26 VITALS — BP 116/76 | HR 90 | Ht 63.0 in | Wt 113.0 lb

## 2020-11-26 DIAGNOSIS — I2510 Atherosclerotic heart disease of native coronary artery without angina pectoris: Secondary | ICD-10-CM

## 2020-11-26 NOTE — Progress Notes (Signed)
Error. Incorrect patient brought to clinic by SNF. Appt cancelled.   Loel Dubonnet, NP

## 2020-12-22 ENCOUNTER — Encounter (HOSPITAL_BASED_OUTPATIENT_CLINIC_OR_DEPARTMENT_OTHER): Payer: Self-pay

## 2020-12-22 ENCOUNTER — Ambulatory Visit (HOSPITAL_BASED_OUTPATIENT_CLINIC_OR_DEPARTMENT_OTHER): Payer: Medicare PPO | Admitting: Family

## 2020-12-22 ENCOUNTER — Ambulatory Visit (INDEPENDENT_AMBULATORY_CARE_PROVIDER_SITE_OTHER): Payer: Medicare PPO | Admitting: Family

## 2020-12-22 ENCOUNTER — Other Ambulatory Visit: Payer: Self-pay

## 2020-12-22 ENCOUNTER — Encounter (HOSPITAL_BASED_OUTPATIENT_CLINIC_OR_DEPARTMENT_OTHER): Payer: Self-pay | Admitting: Family

## 2020-12-22 VITALS — BP 120/76 | HR 75 | Ht 63.0 in | Wt 92.2 lb

## 2020-12-22 DIAGNOSIS — I25118 Atherosclerotic heart disease of native coronary artery with other forms of angina pectoris: Secondary | ICD-10-CM | POA: Diagnosis not present

## 2020-12-22 DIAGNOSIS — I5032 Chronic diastolic (congestive) heart failure: Secondary | ICD-10-CM

## 2020-12-22 DIAGNOSIS — I10 Essential (primary) hypertension: Secondary | ICD-10-CM | POA: Diagnosis not present

## 2020-12-22 DIAGNOSIS — I480 Paroxysmal atrial fibrillation: Secondary | ICD-10-CM

## 2020-12-22 NOTE — Patient Instructions (Signed)
Medication Instructions:  Continue your current medications.   *If you need a refill on your cardiac medications before your next appointment, please call your pharmacy*   Lab Work: None ordered today.   Testing/Procedures: None ordered today.   Follow-Up: At [facility], you and your health needs are our priority.  As part of our continuing mission to provide you with exceptional heart care, we have created designated Provider Care Teams.  These Care Teams include your primary Cardiologist (physician) and Advanced Practice Providers (APPs -  Physician Assistants and Nurse Practitioners) who all work together to provide you with the care you need, when you need it.  We recommend signing up for the patient portal called "MyChart".  Sign up information is provided on this After Visit Summary.  MyChart is used to connect with patients for Virtual Visits (Telemedicine).  Patients are able to view lab/test results, encounter notes, upcoming appointments, etc.  Non-urgent messages can be sent to your provider as well.   To learn more about what you can do with MyChart, go to [email or web address].    Your next appointment:   6 month(s)  The format for your next appointment:   In Person  Provider:   Lauree Chandler, MD    Other Instructions  Heart Healthy Diet Recommendations: A low-salt diet is recommended. Meats should be grilled, baked, or boiled. Avoid fried foods. Focus on lean protein sources like fish or chicken with vegetables and fruits. The American Heart Association is a Microbiologist!  American Heart Association Diet and Lifeystyle Recommendations    Exercise recommendations: The American Heart Association recommends 150 minutes of moderate intensity exercise weekly. Try 30 minutes of moderate intensity exercise 4-5 times per week. This could include walking, jogging, or swimming.

## 2020-12-22 NOTE — Progress Notes (Signed)
Office Visit    Patient Name: [name] Date of Encounter: 12/22/2020  PCP:  [name], [facility] [place]  Cardiologist:  Joyce Chandler, MD  Advanced Practice Provider:  No care team member to display Electrophysiologist:  None     Chief Complaint    [name] is a 87 y.o. female with a hx of CAD, atrial flutter, HTN, AI, MR, dyspnea, chronic diastolic heart failure presents today for hospital follow up   Past Medical History    Past Medical History:  Diagnosis Date   Arthritis    FINGERS    CAD (coronary artery disease) CARDIOLOGIST-- DR [name]   a. s/p INF STEMI 7/12: tx with BMS to RCA;  b. cath 08/26/10: pLAD 30%, mLAD 50%, D1 40%, pCFX 95%, mRCA occluded;   c. staged PCI of pCFX with BMS;   d. echo 7/12:   EF 60-65%, mild RAE, mild to moderate AI, mild MR, moderate TR, RVE, PASP 47   Complication of anesthesia PT STATES "MADE HER FEEL CRAZY"   Degeneration of eye    left eye cornea   Dyspnea    First degree heart block    Heart palpitations PAC'S AND SVT RUN'S  PER CARDIOLOGIST NOTE   History of ST elevation myocardial infarction (STEMI) 08-26-2010-- INFERIOR WALL   S/P PCI  BMS IN RCA AND PROX. CX   Hyperlipidemia    Hypertension    Impaired hearing BILATERAL HEARING AIDS   Osteopenia    PAF (paroxysmal atrial fibrillation) (HCC)    PONV (postoperative nausea and vomiting)    Pulmonary nodules BENIGN  PER CT  10-12-2010   Rectal Cancer 08/2010   adenocarcinoma   S/P PARTIAL PROCTECTOMY (NO CHEMO OR RADIATION)   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 01/11/2011   S/P colostomy ([place]) SECONDARY TO RECTOVAGINAL FISTULA   S/P coronary artery stent placement 08/2010   X2  BM   Past Surgical History:  Procedure Laterality Date   ABDOMINAL HYSTERECTOMY  1950's   AND APPENDECTOMY   CATARACT EXTRACTION W/ INTRAOCULAR LENS  IMPLANT, BILATERAL     CORONARY ANGIOPLASTY WITH STENT PLACEMENT   08-26-2010  DR [name]   PCI, BM STENT IN RCA   CORONARY ANGIOPLASTY WITH STENT PLACEMENT  08-29-2010  DR [place]   PCI, BM STENT IN PROXIMAL CIRCUMFLEX   EXCISION BENIGN CYST RIGHT BREAST     FISTULA PLUG N/A 06/21/2012   Procedure: insertion of FISTULA PLUG;  Surgeon: Joyce Ruff, MD;  Location: [facility];  Service: General;  Laterality: N/A;   [name] N/A 04/02/2012   Procedure: FLEXIBLE SIGMOIDOSCOPY;  Surgeon: Joyce Ruff, MD;  Location: [facility];  Service: Endoscopy;  Laterality: N/A;   KNEE SURGERY Right 1996   LAPROSCOPY LYSIS ADHESIONS/ DRAINAGE OF PELVIC ABSCESS/ DIVERTING LOOP SIGMOID COLECTOMY  11-22-2010   POST OP RECTOVAGINAL FISTULA   PARTIAL PROCTECTOMY BY TEM  11-17-2010   RECTAL CANCER   RELEASE LEFT CARPAL TUNNEL/ OSTEOTOMY LEFT DISTAL RADIUS  11-10-2009   STAPEDECTOMY  1970'S   TONSILLECTOMY  CHILD   TOTAL HIP ARTHROPLASTY Right 1992   TRANSTHORACIC ECHOCARDIOGRAM  10-10-2011   NORMAL LV SIZE WITH MILD FOCAL BASAL SEPTAL HYPERTROPHY/ EF 55-60%/ NORMAL RV SIZE AND LVSF/ BIATRIAL ENLARGEMENT/ MILD TO MODERATE AI  &  TR   TYMPANOPLASTY Left 12-23-2009    Allergies  Allergies  Allergen Reactions   Effexor [Venlafaxine Hydrochloride] Nausea And Vomiting  Penicillins Shortness Of Breath    Has patient had a PCN reaction causing immediate rash, facial/tongue/throat swelling, SOB or lightheadedness with hypotension: Yes Has patient had a PCN reaction causing severe rash involving mucus membranes or skin necrosis: No Has patient had a PCN reaction that required hospitalization: No Has patient had a PCN reaction occurring within the last 10 years: No If all of the above answers are "NO", then may proceed with Cephalosporin use.    Sulfa Antibiotics Nausea Only   Dicyclomine Other (See Comments)    Caused confusion   Lactose Intolerance (Gi) Diarrhea   Hydrocodone Nausea Only    History of Present Illness    [name]  is a 87 y.o. female with a hx of CAD, atrial flutter, HTN, AI, MR, dyspnea, chronic diastolic heart failure last seen 07/19/20 by Dr. Angelena Hamilton.  She was admitted July 2012 with inferior STEMI and total occusion of prox RCA treated with BMS. Moderate residual disease in mid RCA dn severe disease in prox Cx. BMS placed in Cx in staged procedure. Echo 2016 normal LVEF, moderate AI, mild MR. Previous monitor 2013 showed SVT, PVC, PAC. Found to have atrial flutter 10/2015. Anticoagulation deferred given advanced age and fall risk. Probably TIA in January 2018 and has recovered neurologically. Echo 02/2017 normal LVEF, gr3DD, mild to moderate AI, trivial AI.She has had severe fatigue and dyspnea for a number of years which improved some with Lasix. She was started on Amiodarone summer 2021 while at [facility] after a fall. Echo 04/2020 LVEF 60-65%, mild LVH, moderate MR, mild to moderate AI, severe TR. Admitted 05/2020 with atrial fib with RVR and converted with IV amiodarone. Admitted 07/04/20 after syncopal event while being treated for UTI. She was seen 07/2020 by Dr. Angelena [place] doing well from cardiac perspective.   Since last seen admitted 08/12/20 after a fall after tripping over comforter. Recommended for nonoperative  management by ortho. Discharged to SNF. Hospitalized 10/2020 with UTI causing weakness but no sepsis.   She presents today for follow up. Present with transportation assistance from [place]. Reports no shortness of breath at rest and stable dyspnea on exertion. Notes with activities such as dressing.  Reports no chest pain, pressure, or tightness. No edema, orthopnea, PND. Reports no palpitations.  Enjoys participating in bingo at her facility and her physical therapy sessions.   EKGs/Labs/Other Studies Reviewed:   The following studies were reviewed today:  Echo March 2022:  1. Left ventricular ejection fraction, by estimation, is 60 to 65%. The  left ventricle has normal function. The left ventricle  has no regional  wall motion abnormalities. There is mild concentric left ventricular  hypertrophy. Left ventricular diastolic  function could not be evaluated. There is the interventricular septum is  flattened in systole and diastole, consistent with right ventricular  pressure and volume overload.   2. Right ventricular systolic function is normal. The right ventricular  size is mildly enlarged. There is mildly elevated pulmonary artery  systolic pressure. The estimated right ventricular systolic pressure is  62.7 mmHg.   3. Left atrial size was moderately dilated.   4. Right atrial size was severely dilated.   5. The mitral valve is normal in structure. Moderate mitral valve  regurgitation. No evidence of mitral stenosis. Severe mitral annular  calcification.   6. Tricuspid valve regurgitation is severe.   7. The aortic valve is normal in structure. There is mild calcification  of the aortic valve. There is mild thickening of the aortic  valve. Aortic  valve regurgitation is mild to moderate. Mild to moderate aortic valve  sclerosis/calcification is present,  without any evidence of aortic stenosis.   8. The inferior vena cava is dilated in size with <50% respiratory  variability, suggesting right atrial pressure of 15 mmHg.   9. Evidence of atrial level shunting detected by color flow Doppler.  There is a small secundum atrial septal defect with predominantly left to  right shunting across the atrial septum.   EKG:  No EKG today.    Recent Labs: 10/22/2020: TSH 7.682 10/24/2020: ALT 12; B Natriuretic Peptide 154.6; BUN 14; Creatinine, Ser 1.01; Hemoglobin 11.8; Magnesium 1.8; Platelets 191; Potassium 4.0; Sodium 137  Recent Lipid Panel    Component Value Date/Time   CHOL 191 02/23/2017 0503   TRIG 77 02/23/2017 0503   HDL 67 02/23/2017 0503   CHOLHDL 2.9 02/23/2017 0503   VLDL 15 02/23/2017 0503   LDLCALC 109 (H) 02/23/2017 0503   Home Medications   Current Meds   Medication Sig   acetaminophen (TYLENOL) 500 MG tablet Take 2 tablets (1,000 mg total) by mouth every 6 (six) hours as needed for mild pain. (Patient taking differently: Take 1,000 mg by mouth every 12 (twelve) hours as needed for mild pain.)   amiodarone (PACERONE) 200 MG tablet 1 tab (200 mg total) by mouth twice daily for 1 week followed by 1 tab (200 mg total) by mouth daily thereafter. (Patient taking differently: Take 200 mg by mouth daily.)   aspirin EC 81 MG tablet Take 81 mg by mouth every morning.   beta carotene w/minerals (OCUVITE) tablet Take 1 tablet by mouth every morning.   cholecalciferol (VITAMIN D3) 25 MCG (1000 UNIT) tablet Take 1,000 Units by mouth every morning.   clonazePAM (KLONOPIN) 0.5 MG tablet Take 0.5 tablets (0.25 mg total) by mouth at bedtime.   docusate sodium (COLACE) 100 MG capsule Take 100 mg by mouth at bedtime.   furosemide (LASIX) 20 MG tablet Take 20 mg by mouth daily.   levothyroxine (SYNTHROID) 25 MCG tablet Take 25 mcg by mouth daily.   melatonin 5 MG TABS Take 5 mg by mouth at bedtime.   nitrofurantoin, macrocrystal-monohydrate, (MACROBID) 100 MG capsule Take 100 mg by mouth 2 (two) times daily.   nitroGLYCERIN (NITROSTAT) 0.4 MG SL tablet Place 0.4 mg under the tongue every 5 (five) minutes as needed for chest pain.   ondansetron (ZOFRAN) 4 MG tablet Take 4 mg by mouth every 8 (eight) hours as needed for nausea or vomiting.   Polyethyl Glycol-Propyl Glycol (SYSTANE) 0.4-0.3 % GEL ophthalmic gel Place 1 drop into both eyes at bedtime.    sertraline (ZOLOFT) 25 MG tablet Take 25 mg by mouth at bedtime.     Review of Systems      All other systems reviewed and are otherwise negative except as noted above.  Physical Exam    VS:  BP 120/76   Pulse 75   Ht 5\' 3"  (1.6 m)   Wt 92 lb 3.2 oz (41.8 kg)   BMI 16.33 kg/m  , BMI Body mass index is 16.33 kg/m.  Wt Readings from Last 3 Encounters:  12/22/20 92 lb 3.2 oz (41.8 kg)  11/26/20 113 lb (51.3  kg)  10/27/20 107 lb 2.3 oz (48.6 kg)     GEN: Well nourished, well developed, in no acute distress. HEENT: normal. Neck: Supple, no JVD, carotid bruits, or masses. Cardiac: RRR, no murmurs, rubs, or gallops. No clubbing, cyanosis,  edema.  Radials/PT 2+ and equal bilaterally.  Respiratory:  Respirations regular and unlabored, clear to auscultation bilaterally. GI: Soft, nontender, nondistended. MS: No deformity or atrophy. Skin: Warm and dry, no rash. Neuro:  Strength and sensation are intact. Psych: Normal affect.  Assessment & Plan    CAD - Stable with no anginal symptoms. No indication for ischemic evaluation.  Continue Aspirin.   Paroxysmal atrial flutter - NSR on exam today. Anticoagulation has been deferred given age. Continue Amiodarone. Denies palpitations   HTN - BP well controlled. Continue current antihypertensive regimen.    AI/MR - Echo 04/2020 with mild to moderate AI and moderate MR. Continue optimal BP control. Echo for monitoring only as clinically indicated. Not indicated at this time.   Chronic diastolic heart failure - Euvolemic and well compensated on exam. No edema, orthopnea, PND. Continue current dose Lasix.   Deconditioning - Encouraged to continue working with PT and continue exercises even when formal therapy sessions completed.   Disposition: Follow up in 6 month(s) with Joyce Chandler, MD or APP.  Signed, Loel Dubonnet, NP 12/22/2020, 7:47 PM [facility]

## 2021-01-04 ENCOUNTER — Ambulatory Visit (HOSPITAL_BASED_OUTPATIENT_CLINIC_OR_DEPARTMENT_OTHER): Payer: Medicare PPO | Admitting: Family

## 2021-04-13 DEATH — deceased

## 2022-12-16 ENCOUNTER — Other Ambulatory Visit (HOSPITAL_COMMUNITY): Payer: Self-pay
